# Patient Record
Sex: Male | Born: 1945 | Race: White | Hispanic: No | Marital: Single | Smoking: Current every day smoker
Health system: Southern US, Community
[De-identification: ages and names within clinical notes are randomized; demographics above are authoritative.]

## PROBLEM LIST (undated history)

## (undated) DIAGNOSIS — K219 Gastro-esophageal reflux disease without esophagitis: Secondary | ICD-10-CM

## (undated) DIAGNOSIS — I358 Other nonrheumatic aortic valve disorders: Secondary | ICD-10-CM

## (undated) DIAGNOSIS — I509 Heart failure, unspecified: Secondary | ICD-10-CM

## (undated) DIAGNOSIS — E559 Vitamin D deficiency, unspecified: Secondary | ICD-10-CM

## (undated) DIAGNOSIS — F419 Anxiety disorder, unspecified: Secondary | ICD-10-CM

## (undated) DIAGNOSIS — I219 Acute myocardial infarction, unspecified: Secondary | ICD-10-CM

## (undated) DIAGNOSIS — I639 Cerebral infarction, unspecified: Secondary | ICD-10-CM

## (undated) DIAGNOSIS — G20A1 Parkinson's disease without dyskinesia, without mention of fluctuations: Secondary | ICD-10-CM

## (undated) DIAGNOSIS — M5030 Other cervical disc degeneration, unspecified cervical region: Secondary | ICD-10-CM

## (undated) DIAGNOSIS — E785 Hyperlipidemia, unspecified: Secondary | ICD-10-CM

## (undated) DIAGNOSIS — R251 Tremor, unspecified: Secondary | ICD-10-CM

## (undated) DIAGNOSIS — M419 Scoliosis, unspecified: Secondary | ICD-10-CM

## (undated) DIAGNOSIS — T7840XA Allergy, unspecified, initial encounter: Secondary | ICD-10-CM

## (undated) DIAGNOSIS — N401 Enlarged prostate with lower urinary tract symptoms: Secondary | ICD-10-CM

## (undated) DIAGNOSIS — Z8673 Personal history of transient ischemic attack (TIA), and cerebral infarction without residual deficits: Secondary | ICD-10-CM

## (undated) DIAGNOSIS — I10 Essential (primary) hypertension: Secondary | ICD-10-CM

## (undated) DIAGNOSIS — J309 Allergic rhinitis, unspecified: Secondary | ICD-10-CM

## (undated) DIAGNOSIS — N400 Enlarged prostate without lower urinary tract symptoms: Secondary | ICD-10-CM

## (undated) DIAGNOSIS — G20 Parkinson's disease: Secondary | ICD-10-CM

## (undated) DIAGNOSIS — K623 Rectal prolapse: Secondary | ICD-10-CM

## (undated) DIAGNOSIS — H269 Unspecified cataract: Secondary | ICD-10-CM

## (undated) DIAGNOSIS — M1990 Unspecified osteoarthritis, unspecified site: Secondary | ICD-10-CM

## (undated) DIAGNOSIS — K642 Third degree hemorrhoids: Secondary | ICD-10-CM

## (undated) DIAGNOSIS — Z974 Presence of external hearing-aid: Secondary | ICD-10-CM

## (undated) DIAGNOSIS — Q2112 Patent foramen ovale: Secondary | ICD-10-CM

## (undated) DIAGNOSIS — R972 Elevated prostate specific antigen [PSA]: Secondary | ICD-10-CM

## (undated) DIAGNOSIS — F32A Depression, unspecified: Secondary | ICD-10-CM

## (undated) DIAGNOSIS — G5620 Lesion of ulnar nerve, unspecified upper limb: Secondary | ICD-10-CM

## (undated) DIAGNOSIS — I77810 Thoracic aortic ectasia: Secondary | ICD-10-CM

## (undated) HISTORY — DX: Hyperlipidemia, unspecified: E78.5

## (undated) HISTORY — DX: Essential (primary) hypertension: I10

## (undated) HISTORY — DX: Depression, unspecified: F32.A

## (undated) HISTORY — DX: Other nonrheumatic aortic valve disorders: I35.8

## (undated) HISTORY — DX: Cerebral infarction, unspecified: I63.9

## (undated) HISTORY — DX: Thoracic aortic ectasia: I77.810

## (undated) HISTORY — PX: CARDIAC VALVE REPLACEMENT: SHX585

## (undated) HISTORY — DX: Anxiety disorder, unspecified: F41.9

## (undated) HISTORY — DX: Personal history of transient ischemic attack (TIA), and cerebral infarction without residual deficits: Z86.73

## (undated) HISTORY — DX: Allergy, unspecified, initial encounter: T78.40XA

## (undated) HISTORY — DX: Gastro-esophageal reflux disease without esophagitis: K21.9

## (undated) HISTORY — DX: Third degree hemorrhoids: K64.2

## (undated) HISTORY — DX: Unspecified cataract: H26.9

## (undated) HISTORY — DX: Scoliosis, unspecified: M41.9

## (undated) HISTORY — DX: Allergic rhinitis, unspecified: J30.9

## (undated) HISTORY — DX: Patent foramen ovale: Q21.12

## (undated) HISTORY — PX: EYE SURGERY: SHX253

## (undated) HISTORY — DX: Tremor, unspecified: R25.1

## (undated) HISTORY — DX: Rectal prolapse: K62.3

## (undated) HISTORY — PX: FRACTURE SURGERY: SHX138

## (undated) HISTORY — DX: Vitamin D deficiency, unspecified: E55.9

## (undated) HISTORY — DX: Lesion of ulnar nerve, unspecified upper limb: G56.20

## (undated) HISTORY — DX: Parkinson's disease: G20

## (undated) HISTORY — PX: ABDOMINAL HYSTERECTOMY: SHX81

## (undated) HISTORY — DX: Acute myocardial infarction, unspecified: I21.9

## (undated) HISTORY — DX: Other cervical disc degeneration, unspecified cervical region: M50.30

## (undated) HISTORY — DX: Unspecified osteoarthritis, unspecified site: M19.90

## (undated) HISTORY — DX: Parkinson's disease without dyskinesia, without mention of fluctuations: G20.A1

---

## 2004-09-02 HISTORY — PX: NASAL SINUS SURGERY: SHX719

## 2005-09-02 HISTORY — PX: COLONOSCOPY: SHX174

## 2010-07-10 ENCOUNTER — Encounter (INDEPENDENT_AMBULATORY_CARE_PROVIDER_SITE_OTHER): Payer: Self-pay | Admitting: *Deleted

## 2010-07-17 ENCOUNTER — Encounter: Payer: Self-pay | Admitting: Gastroenterology

## 2010-09-05 ENCOUNTER — Encounter (INDEPENDENT_AMBULATORY_CARE_PROVIDER_SITE_OTHER): Payer: Self-pay | Admitting: *Deleted

## 2010-09-13 ENCOUNTER — Ambulatory Visit: Admit: 2010-09-13 | Payer: Self-pay | Attending: Gastroenterology | Admitting: Gastroenterology

## 2010-10-02 NOTE — Letter (Signed)
Summary: New Patient letter  [facility]  [address] [place], [place] [address]   Phone: [phone]  Fax: [phone]       07/10/2010 MRN: [id number]  Travis Reese [address] [place], [place]  [address]  Dear Mr. Travis Reese,  Welcome to the Gastroenterology Division at [facility].    You are scheduled to see Dr.  Christella Hartigan on 08-21-10 at 3:30p.m. on the 3rd floor at [facility], [address]  We ask that you try to arrive at our office 15 minutes prior to your appointment time to allow for check-in.  We would like you to complete the enclosed self-administered evaluation form prior to your visit and bring it with you on the day of your appointment.  We will review it with you.  Also, please bring a complete list of all your medications or, if you prefer, bring the medication bottles and we will list them.  Please bring your insurance card so that we may make a copy of it.  If your insurance requires a referral to see a specialist, please bring your referral form from your primary care physician.  Co-payments are due at the time of your visit and may be paid by cash, check or credit card.     Your office visit will consist of a consult with your physician (includes a physical exam), any laboratory testing he/she may order, scheduling of any necessary diagnostic testing (e.g. x-ray, ultrasound, CT-scan), and scheduling of a procedure (e.g. Endoscopy, Colonoscopy) if required.  Please allow enough time on your schedule to allow for any/all of these possibilities.    If you cannot keep your appointment, please call [phone] to cancel or reschedule prior to your appointment date.  This allows [place] the opportunity to schedule an appointment for another patient in need of care.  If you do not cancel or reschedule by 5 p.m. the business day prior to your appointment date, you will be charged a $50.00 late cancellation/no-show fee.    Thank you for choosing [facility] for your medical needs.  We appreciate the opportunity to care for you.  Please visit [place] at our website  to learn more about our practice.                     Sincerely,                                                             The Gastroenterology Division

## 2010-10-02 NOTE — Letter (Signed)
Summary: New Patient letter  [facility]  [address] [place], [place] [address]   Phone: [phone]  Fax: [phone]       07/17/2010 MRN: [id number]  Travis Reese [address] [place], [place]  [address]  Dear Mr. SITZER,  Welcome to the Gastroenterology Division at [facility]    You are scheduled to see Dr.  Christella Hartigan on 09-05-2010  at 11am on the 3rd floor at [facility], [address].  We ask that you try to arrive at our office 15 minutes prior to your appointment time to allow for check-in.  We would like you to complete the enclosed self-administered evaluation form prior to your visit and bring it with you on the day of your appointment.  We will review it with you.  Also, please bring a complete list of all your medications or, if you prefer, bring the medication bottles and we will list them.  Please bring your insurance card so that we may make a copy of it.  If your insurance requires a referral to see a specialist, please bring your referral form from your primary care physician.  Co-payments are due at the time of your visit and may be paid by cash, check or credit card.  If no insurance, $184 would be required at check in.   Your office visit will consist of a consult with your physician (includes a physical exam), any laboratory testing he/she may order, scheduling of any necessary diagnostic testing (e.g. x-ray, ultrasound, CT-scan), and scheduling of a procedure (e.g. Endoscopy, Colonoscopy) if required.  Please allow enough time on your schedule to allow for any/all of these possibilities.    If you cannot keep your appointment, please call [phone] to cancel or reschedule prior to your appointment date.  This allows [place] the opportunity to schedule an appointment for another patient in need of care.  If you do not cancel or reschedule by 5 p.m. the business day prior to your appointment date, you will be charged a $50.00 late  cancellation/no-show fee.    Thank you for choosing [facility] for your medical needs.  We appreciate the opportunity to care for you.  Please visit [place] at our website  to learn more about our practice.                     Sincerely,                                                             The Gastroenterology Division

## 2010-10-04 NOTE — Letter (Signed)
Summary: New Patient letter  [facility]  [address] [place], [place] [address]   Phone: [phone]  Fax: [phone]       09/05/2010 MRN: [id number]  Travis Reese [address] [place], [place]  [address]  Dear Travis Reese,  Welcome to the Gastroenterology Division at [facility].    You are scheduled to see Dr.  Juanda Chance on 10-31-10 at 9:15a.m. on the 3rd floor at [facility], [address].  We ask that you try to arrive at our office 15 minutes prior to your appointment time to allow for check-in.  We would like you to complete the enclosed self-administered evaluation form prior to your visit and bring it with you on the day of your appointment.  We will review it with you.  Also, please bring a complete list of all your medications or, if you prefer, bring the medication bottles and we will list them.  Please bring your insurance card so that we may make a copy of it.  If your insurance requires a referral to see a specialist, please bring your referral form from your primary care physician.  Co-payments are due at the time of your visit and may be paid by cash, check or credit card.     Your office visit will consist of a consult with your physician (includes a physical exam), any laboratory testing he/she may order, scheduling of any necessary diagnostic testing (e.g. x-ray, ultrasound, CT-scan), and scheduling of a procedure (e.g. Endoscopy, Colonoscopy) if required.  Please allow enough time on your schedule to allow for any/all of these possibilities.    If you cannot keep your appointment, please call [phone] to cancel or reschedule prior to your appointment date.  This allows [place] the opportunity to schedule an appointment for another patient in need of care.  If you do not cancel or reschedule by 5 p.m. the business day prior to your appointment date, you will be charged a $50.00 late cancellation/no-show fee.    Thank you for choosing [facility] for your medical needs.  We appreciate the opportunity to care for you.  Please visit [place] at our website  to learn more about our practice.                     Sincerely,                                                             The Gastroenterology Division

## 2010-10-31 ENCOUNTER — Ambulatory Visit: Payer: Self-pay | Admitting: Internal Medicine

## 2015-02-01 ENCOUNTER — Ambulatory Visit (INDEPENDENT_AMBULATORY_CARE_PROVIDER_SITE_OTHER): Payer: Self-pay

## 2015-02-01 ENCOUNTER — Ambulatory Visit (INDEPENDENT_AMBULATORY_CARE_PROVIDER_SITE_OTHER): Payer: Self-pay | Admitting: Internal Medicine

## 2015-02-01 VITALS — BP 114/80 | HR 78 | Temp 98.6°F | Resp 16 | Ht 72.0 in | Wt 231.8 lb

## 2015-02-01 DIAGNOSIS — J9801 Acute bronchospasm: Secondary | ICD-10-CM

## 2015-02-01 DIAGNOSIS — R071 Chest pain on breathing: Secondary | ICD-10-CM

## 2015-02-01 DIAGNOSIS — R059 Cough, unspecified: Secondary | ICD-10-CM

## 2015-02-01 DIAGNOSIS — J324 Chronic pansinusitis: Secondary | ICD-10-CM

## 2015-02-01 DIAGNOSIS — R05 Cough: Secondary | ICD-10-CM

## 2015-02-01 DIAGNOSIS — J189 Pneumonia, unspecified organism: Secondary | ICD-10-CM

## 2015-02-01 LAB — POCT CBC
Granulocyte percent: 78 %G (ref 37–80)
HCT, POC: 40.1 % — AB (ref 43.5–53.7)
Hemoglobin: 13.6 g/dL — AB (ref 14.1–18.1)
Lymph, poc: 2.5 (ref 0.6–3.4)
MCH, POC: 33.5 pg — AB (ref 27–31.2)
MCHC: 33.9 g/dL (ref 31.8–35.4)
MCV: 98.8 fL — AB (ref 80–97)
MID (cbc): 1.1 — AB (ref 0–0.9)
MPV: 6.3 fL (ref 0–99.8)
POC Granulocyte: 12.8 — AB (ref 2–6.9)
POC LYMPH PERCENT: 15.1 %L (ref 10–50)
POC MID %: 6.9 %M (ref 0–12)
Platelet Count, POC: 278 10*3/uL (ref 142–424)
RBC: 4.06 M/uL — AB (ref 4.69–6.13)
RDW, POC: 12.4 %
WBC: 16.4 10*3/uL — AB (ref 4.6–10.2)

## 2015-02-01 MED ORDER — AZITHROMYCIN 500 MG PO TABS: 500.0000 mg | ORAL_TABLET | Freq: Every day | ORAL | Status: DC

## 2015-02-01 MED ORDER — CEFTRIAXONE SODIUM 1 G IJ SOLR
1.0000 g | Freq: Once | INTRAMUSCULAR | Status: AC
  Administered 2015-02-01: 1 g via INTRAMUSCULAR

## 2015-02-01 MED ORDER — HYDROCODONE-ACETAMINOPHEN 7.5-325 MG/15ML PO SOLN: 10.0000 mL | Freq: Four times a day (QID) | ORAL | Status: DC | PRN

## 2015-02-01 MED ORDER — ALBUTEROL SULFATE HFA 108 (90 BASE) MCG/ACT IN AERS: 2.0000 | INHALATION_SPRAY | Freq: Four times a day (QID) | RESPIRATORY_TRACT | Status: DC | PRN

## 2015-02-01 NOTE — Progress Notes (Signed)
   Subjective:    Patient ID: Travis Reese, male    DOB: Jun 03, 1946, 69 y.o.   MRN: [id number]  HPI I, Jule Ser R.T. (R), am scribing for Dr. Lou Miner M.D.   Travis Reese comes in today with sore throat, cough, congestion with yellow sputum x 8 days. He has chest tightness with wheezing. He also states he has had fever spiking 103 degrees last night with nausea. He denies diarrhea and constipation. He has no Hx of asthma or Diabetes. He has recently been incarcerated with no current PCP. He has been tested for TB annually. He is also requesting a refill of his Lisinopril HCTZ. He has a Hx of HTN. He states he regularly checks his BP which is 120-130/ 80-85. He states he takes his Lisinopril daily with no issues. Due to insurance reasons does not want rfs of BP meds. He has yellow green nasal and chest discharge. No dizzy, syncope  Review of Systems     Objective:   Physical Exam  Constitutional: He is oriented to person, place, and time. He appears well-developed and well-nourished. No distress.  HENT:  Head: Normocephalic.  Right Ear: External ear normal.  Nose: Mucosal edema and rhinorrhea present. Epistaxis is observed. Right sinus exhibits maxillary sinus tenderness and frontal sinus tenderness. Left sinus exhibits maxillary sinus tenderness and frontal sinus tenderness.  Mouth/Throat: Oropharynx is clear and moist.  Eyes: Conjunctivae and EOM are normal. Pupils are equal, round, and reactive to light.  Neck: Normal range of motion. Neck supple.  Cardiovascular: Normal rate, regular rhythm and normal heart sounds.   Pulmonary/Chest: No tachypnea. No respiratory distress. He has decreased breath sounds. He has wheezes. He has rhonchi. He has rales.    Worse on right  Lymphadenopathy:    He has cervical adenopathy.  Neurological: He is alert and oriented to person, place, and time. He exhibits normal muscle tone. Coordination normal.  Skin: He is not diaphoretic.    Psychiatric: He has a normal mood and affect. His behavior is normal. Thought content normal.    [facility] reading (PRIMARY) by  Dr.Guest. Chronic changes, increased markings right greater than left Results for orders placed or performed in visit on 02/01/15  POCT CBC  Result Value Ref Range   WBC 16.4 (A) 4.6 - 10.2 K/uL   Lymph, poc 2.5 0.6 - 3.4   POC LYMPH PERCENT 15.1 10 - 50 %L   MID (cbc) 1.1 (A) 0 - 0.9   POC MID % 6.9 0 - 12 %M   POC Granulocyte 12.8 (A) 2 - 6.9   Granulocyte percent 78.0 37 - 80 %G   RBC 4.06 (A) 4.69 - 6.13 M/uL   Hemoglobin 13.6 (A) 14.1 - 18.1 g/dL   HCT, POC 40.1 (A) 43.5 - 53.7 %   MCV 98.8 (A) 80 - 97 fL   MCH, POC 33.5 (A) 27 - 31.2 pg   MCHC 33.9 31.8 - 35.4 g/dL   RDW, POC 12.4 %   Platelet Count, POC 278 142 - 424 K/uL   MPV 6.3 0 - 99.8 fL          Assessment & Plan:  Sinusitis/Bronchospasm/Pneumonia Rocephin 1g/Zithroromax 500mg /albuterol

## 2015-02-01 NOTE — Patient Instructions (Addendum)
Sinusitis Sinusitis is redness, soreness, and inflammation of the paranasal sinuses. Paranasal sinuses are air pockets within the bones of your face (beneath the eyes, the middle of the forehead, or above the eyes). In healthy paranasal sinuses, mucus is able to drain out, and air is able to circulate through them by way of your nose. However, when your paranasal sinuses are inflamed, mucus and air can become trapped. This can allow bacteria and other germs to grow and cause infection. Sinusitis can develop quickly and last only a short time (acute) or continue over a long period (chronic). Sinusitis that lasts for more than 12 weeks is considered chronic.  CAUSES  Causes of sinusitis include:  Allergies.  Structural abnormalities, such as displacement of the cartilage that separates your nostrils (deviated septum), which can decrease the air flow through your nose and sinuses and affect sinus drainage.  Functional abnormalities, such as when the small hairs (cilia) that line your sinuses and help remove mucus do not work properly or are not present. SIGNS AND SYMPTOMS  Symptoms of acute and chronic sinusitis are the same. The primary symptoms are pain and pressure around the affected sinuses. Other symptoms include:  Upper toothache.  Earache.  Headache.  Bad breath.  Decreased sense of smell and taste.  A cough, which worsens when you are lying flat.  Fatigue.  Fever.  Thick drainage from your nose, which often is green and may contain pus (purulent).  Swelling and warmth over the affected sinuses. DIAGNOSIS  Your health care provider will perform a physical exam. During the exam, your health care provider may:  Look in your nose for signs of abnormal growths in your nostrils (nasal polyps).  Tap over the affected sinus to check for signs of infection.  View the inside of your sinuses (endoscopy) using an imaging device that has a light attached (endoscope). If your health  care provider suspects that you have chronic sinusitis, one or more of the following tests may be recommended:  Allergy tests.  Nasal culture. A sample of mucus is taken from your nose, sent to a lab, and screened for bacteria.  Nasal cytology. A sample of mucus is taken from your nose and examined by your health care provider to determine if your sinusitis is related to an allergy. TREATMENT  Most cases of acute sinusitis are related to a viral infection and will resolve on their own within 10 days. Sometimes medicines are prescribed to help relieve symptoms (pain medicine, decongestants, nasal steroid sprays, or saline sprays).  However, for sinusitis related to a bacterial infection, your health care provider will prescribe antibiotic medicines. These are medicines that will help kill the bacteria causing the infection.  Rarely, sinusitis is caused by a fungal infection. In theses cases, your health care provider will prescribe antifungal medicine. For some cases of chronic sinusitis, surgery is needed. Generally, these are cases in which sinusitis recurs more than 3 times per year, despite other treatments. HOME CARE INSTRUCTIONS   Drink plenty of water. Water helps thin the mucus so your sinuses can drain more easily.  Use a humidifier.  Inhale steam 3 to 4 times a day (for example, sit in the bathroom with the shower running).  Apply a warm, moist washcloth to your face 3 to 4 times a day, or as directed by your health care provider.  Use saline nasal sprays to help moisten and clean your sinuses.  Take medicines only as directed by your health care provider.    If you were prescribed either an antibiotic or antifungal medicine, finish it all even if you start to feel better. SEEK IMMEDIATE MEDICAL CARE IF:  You have increasing pain or severe headaches.  You have nausea, vomiting, or drowsiness.  You have swelling around your face.  You have vision problems.  You have a stiff  neck.  You have difficulty breathing. MAKE SURE YOU:   Understand these instructions.  Will watch your condition.  Will get help right away if you are not doing well or get worse. Document Released: 08/19/2005 Document Revised: 01/03/2014 Document Reviewed: 09/03/2011 [facility] Patient Information 2015 [place], [place]. This information is not intended to replace advice given to you by your health care provider. Make sure you discuss any questions you have with your health care provider. Bronchospasm A bronchospasm is a spasm or tightening of the airways going into the lungs. During a bronchospasm breathing becomes more difficult because the airways get smaller. When this happens there can be coughing, a whistling sound when breathing (wheezing), and difficulty breathing. Bronchospasm is often associated with asthma, but not all patients who experience a bronchospasm have asthma. CAUSES  A bronchospasm is caused by inflammation or irritation of the airways. The inflammation or irritation may be triggered by:   Allergies (such as to animals, pollen, food, or mold). Allergens that cause bronchospasm may cause wheezing immediately after exposure or many hours later.   Infection. Viral infections are believed to be the most common cause of bronchospasm.   Exercise.   Irritants (such as pollution, cigarette smoke, strong odors, aerosol sprays, and paint fumes).   Weather changes. Winds increase molds and pollens in the air. Rain refreshes the air by washing irritants out. Cold air may cause inflammation.   Stress and emotional upset.  SIGNS AND SYMPTOMS   Wheezing.   Excessive nighttime coughing.   Frequent or severe coughing with a simple cold.   Chest tightness.   Shortness of breath.  DIAGNOSIS  Bronchospasm is usually diagnosed through a history and physical exam. Tests, such as chest X-rays, are sometimes done to look for other conditions. TREATMENT   Inhaled  medicines can be given to open up your airways and help you breathe. The medicines can be given using either an inhaler or a nebulizer machine.  Corticosteroid medicines may be given for severe bronchospasm, usually when it is associated with asthma. HOME CARE INSTRUCTIONS   Always have a plan prepared for seeking medical care. Know when to call your health care provider and local emergency services (911 in the [place] Know where you can access local emergency care.  Only take medicines as directed by your health care provider.  If you were prescribed an inhaler or nebulizer machine, ask your health care provider to explain how to use it correctly. Always use a spacer with your inhaler if you were given one.  It is necessary to remain calm during an attack. Try to relax and breathe more slowly.  Control your home environment in the following ways:   Change your heating and air conditioning filter at least once a month.   Limit your use of fireplaces and wood stoves.  Do not smoke and do not allow smoking in your home.   Avoid exposure to perfumes and fragrances.   Get rid of pests (such as roaches and mice) and their droppings.   Throw away plants if you see mold on them.   Keep your house clean and dust free.   Replace carpet  with wood, tile, or vinyl flooring. Carpet can trap dander and dust.   Use allergy-proof pillows, mattress covers, and box spring covers.   Wash bed sheets and blankets every week in hot water and dry them in a dryer.   Use blankets that are made of polyester or cotton.   Wash hands frequently. SEEK MEDICAL CARE IF:   You have muscle aches.   You have chest pain.   The sputum changes from clear or white to yellow, green, gray, or bloody.   The sputum you cough up gets thicker.   There are problems that may be related to the medicine you are given, such as a rash, itching, swelling, or trouble breathing.  SEEK IMMEDIATE MEDICAL  CARE IF:   You have worsening wheezing and coughing even after taking your prescribed medicines.   You have increased difficulty breathing.   You develop severe chest pain. MAKE SURE YOU:   Understand these instructions.  Will watch your condition.  Will get help right away if you are not doing well or get worse. Document Released: 08/22/2003 Document Revised: 08/24/2013 Document Reviewed: 02/08/2013 [facility] Patient Information 2015 [place], [place]. This information is not intended to replace advice given to you by your health care provider. Make sure you discuss any questions you have with your health care provider. Pneumonia Pneumonia is an infection of the lungs.  CAUSES Pneumonia may be caused by bacteria or a virus. Usually, these infections are caused by breathing infectious particles into the lungs (respiratory tract). SIGNS AND SYMPTOMS   Cough.  Fever.  Chest pain.  Increased rate of breathing.  Wheezing.  Mucus production. DIAGNOSIS  If you have the common symptoms of pneumonia, your health care provider will typically confirm the diagnosis with a chest X-ray. The X-ray will show an abnormality in the lung (pulmonary infiltrate) if you have pneumonia. Other tests of your blood, urine, or sputum may be done to find the specific cause of your pneumonia. Your health care provider may also do tests (blood gases or pulse oximetry) to see how well your lungs are working. TREATMENT  Some forms of pneumonia may be spread to other people when you cough or sneeze. You may be asked to wear a mask before and during your exam. Pneumonia that is caused by bacteria is treated with antibiotic medicine. Pneumonia that is caused by the influenza virus may be treated with an antiviral medicine. Most other viral infections must run their course. These infections will not respond to antibiotics.  HOME CARE INSTRUCTIONS   Cough suppressants may be used if you are losing too much rest.  However, coughing protects you by clearing your lungs. You should avoid using cough suppressants if you can.  Your health care provider may have prescribed medicine if he or she thinks your pneumonia is caused by bacteria or influenza. Finish your medicine even if you start to feel better.  Your health care provider may also prescribe an expectorant. This loosens the mucus to be coughed up.  Take medicines only as directed by your health care provider.  Do not smoke. Smoking is a common cause of bronchitis and can contribute to pneumonia. If you are a smoker and continue to smoke, your cough may last several weeks after your pneumonia has cleared.  A cold steam vaporizer or humidifier in your room or home may help loosen mucus.  Coughing is often worse at night. Sleeping in a semi-upright position in a recliner or using a couple pillows  under your head will help with this.  Get rest as you feel it is needed. Your body will usually let you know when you need to rest. PREVENTION A pneumococcal shot (vaccine) is available to prevent a common bacterial cause of pneumonia. This is usually suggested for:  People over 26 years old.  Patients on chemotherapy.  People with chronic lung problems, such as bronchitis or emphysema.  People with immune system problems. If you are over 65 or have a high risk condition, you may receive the pneumococcal vaccine if you have not received it before. In some countries, a routine influenza vaccine is also recommended. This vaccine can help prevent some cases of pneumonia.You may be offered the influenza vaccine as part of your care. If you smoke, it is time to quit. You may receive instructions on how to stop smoking. Your health care provider can provide medicines and counseling to help you quit. SEEK MEDICAL CARE IF: You have a fever. SEEK IMMEDIATE MEDICAL CARE IF:   Your illness becomes worse. This is especially true if you are elderly or weakened from  any other disease.  You cannot control your cough with suppressants and are losing sleep.  You begin coughing up blood.  You develop pain which is getting worse or is uncontrolled with medicines.  Any of the symptoms which initially brought you in for treatment are getting worse rather than better.  You develop shortness of breath or chest pain. MAKE SURE YOU:   Understand these instructions.  Will watch your condition.  Will get help right away if you are not doing well or get worse. Document Released: 08/19/2005 Document Revised: 01/03/2014 Document Reviewed: 11/08/2010 [facility] Patient Information 2015 [place], [place]. This information is not intended to replace advice given to you by your health care provider. Make sure you discuss any questions you have with your health care provider.

## 2015-04-19 ENCOUNTER — Ambulatory Visit (INDEPENDENT_AMBULATORY_CARE_PROVIDER_SITE_OTHER): Payer: Medicare Other | Admitting: Internal Medicine

## 2015-04-19 ENCOUNTER — Encounter: Payer: Self-pay | Admitting: Internal Medicine

## 2015-04-19 VITALS — BP 120/80 | HR 82 | Ht 72.0 in | Wt 233.0 lb

## 2015-04-19 DIAGNOSIS — K648 Other hemorrhoids: Secondary | ICD-10-CM

## 2015-04-19 DIAGNOSIS — K642 Third degree hemorrhoids: Secondary | ICD-10-CM

## 2015-04-19 DIAGNOSIS — Z1211 Encounter for screening for malignant neoplasm of colon: Secondary | ICD-10-CM | POA: Diagnosis not present

## 2015-04-19 HISTORY — DX: Third degree hemorrhoids: K64.2

## 2015-04-19 NOTE — Progress Notes (Signed)
Subjective:    Patient ID: Travis Reese, male    DOB: 06-05-1946, 69 y.o.   MRN: [id number] Cc: difficulty cleansing after defecation and fecal soiling HPI 69 yo wm with a long hx of having to wipe multiple x after defecation and digitally emptying rectum. Some minor rectal bleeding. Bright red spotting or on toilet paper. Fiber supplementation has not helped. Had a colonoscopy approx 10 yrs ago in [place] - negative for polyps. 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  Appropriate urge and response to that. He is not aware of a hemorrhoid dx but does have a sense of something bulging when he defecates and afterward.  No Known Allergies Outpatient Prescriptions Prior to Visit  Medication Sig Dispense Refill  . albuterol (PROVENTIL HFA;VENTOLIN HFA) 108 (90 BASE) MCG/ACT inhaler Inhale 2 puffs into the lungs every 6 (six) hours as needed for wheezing or shortness of breath. 1 Inhaler 0  . aspirin 81 MG chewable tablet Chew by mouth daily.    . ClonazePAM (KLONOPIN PO) Take by mouth.    Marland Kitchen lisinopril-hydrochlorothiazide (PRINZIDE,ZESTORETIC) 20-25 MG per tablet Take 1 tablet by mouth daily.    . Psyllium (REGULOID PO) Take by mouth.    Marland Kitchen azithromycin (ZITHROMAX) 500 MG tablet Take 1 tablet (500 mg total) by mouth daily. 5 tablet 0  . HYDROcodone-acetaminophen (HYCET) 7.5-325 mg/15 ml solution Take 10 mLs by mouth every 6 (six) hours as needed. 120 mL 0  . tamsulosin (FLOMAX) 0.4 MG CAPS capsule Take 0.4 mg by mouth.    . terazosin (HYTRIN) 2 MG capsule Take 2 mg by mouth at bedtime.     No facility-administered medications prior to visit.   Past Medical History  Diagnosis Date  . Benign prostatic hyperplasia with elevated prostate specific antigen (PSA)   . Anxiety   . Arthritis   . Pneumonia   . Hypertension   . Prolapsed internal hemorrhoids, grade 3 04/19/2015   Past Surgical  History  Procedure Laterality Date  . Sinus surgery with instatrak    . Wisdom tooth extraction    . Colonoscopy     Social History   Social History  . Marital Status: Unknown    Spouse Name: N/A  . Number of Children: 2  . Years of Education: N/A   Occupational History  . volunteer work    Social History Main Topics  . Smoking status: Current Every Day Smoker -- 0.50 packs/day    Types: Cigarettes  . Smokeless tobacco: None     Comment: formerly quit until 5/16 started again  . Alcohol Use: 0.0 oz/week    0 Standard drinks or equivalent per week     Comment: occasionally  . Drug Use: No  . Sexual Activity: Not Asked   Other Topics Concern  . None   Social History Narrative   Divorced lives with father after incarceration x 5 yrs   1 son, 1 daughter   Volunteer work   3 caffeine/day   04/19/2015   Family History  Problem Relation Age of Onset  . Hypertension Sister   . Colon polyps Father   . Breast cancer Sister   . Prostate cancer Father   . Prostate cancer Paternal Uncle      Review of Systems + back pain, joint pains, decreased hearing, pedal edema, anxiety all other ROS negative    Objective:  Physical Exam @BP  120/80 mmHg  Pulse 82  Ht 6' (1.829 m)  Wt 233 lb (105.688 kg)  BMI 31.59 kg/m2@  General:  Well-developed, well-nourished and in no acute distress Eyes:  anicteric. Lungs: Clear to auscultation bilaterally. Heart:  S1S2, no rubs, murmurs, gallops. Abdomen:  soft, non-tender, no hepatosplenomegaly, hernia, or mass and BS+.  Skin   no rash. Neuro:  A&O x 3.  Psych:  appropriate mood and  Affect.   Rectal:  Patti [place], [place] present.  Anoderm inspection revealed anal tags all positions and Gr 3 RA internal hemorrhoid Anal wink was absent Digital exam revealed slightly reduced resting tone and normal voluntary squeeze. No mass or rectocele present. Simulated defecation with valsalva revealed appropriate abdominal contraction and  descent.     Anoscopy was performed with the patient in the left lateral decubitus position while a chaperone was present and revealed Gr 3 RA, Gr 2-3 RP and Gr 2 LL internal hemorrhoids  PROCEDURE NOTE: The patient presents with symptomatic grade 3 hemorrhoids, requesting rubber band ligation of his/her hemorrhoidal disease.  All risks, benefits and alternative forms of therapy were described and informed consent was obtained.    The decision was made to band the RA internal hemorrhoid, and the [place] was used to perform band ligation without complication.  Digital anorectal examination was then performed to assure proper positioning of the band, and to adjust the banded tissue as required.  The patient was discharged home without pain or other issues.  Dietary and behavioral recommendations were given and along with follow-up instructions.     The following adjunctive treatments were recommended:  Benefiber 3 tbsp daily +/- MiraLax  The patient will return in 2 weeks for  follow-up and possible additional banding as required. No complications were encountered and the patient tolerated the procedure well.       Assessment & Plan:  Prolapsed internal hemorrhoids, grade 3 RA banded - RTC 2 weeks  Needs repeat screening colonoscopy also anticipate after hemorrhoidsTx  I appreciate the opportunity to care for this patient. Bobbye Charleston, MD

## 2015-04-19 NOTE — Patient Instructions (Signed)
HEMORRHOID BANDING PROCEDURE    FOLLOW-UP CARE   1. The procedure you have had should have been relatively painless since the banding of the area involved does not have nerve endings and there is no pain sensation.  The rubber band cuts off the blood supply to the hemorrhoid and the band may fall off as soon as 48 hours after the banding (the band may occasionally be seen in the toilet bowl following a bowel movement). You may notice a temporary feeling of fullness in the rectum which should respond adequately to plain Tylenol or Motrin.  2. Following the banding, avoid strenuous exercise that evening and resume full activity the next day.  A sitz bath (soaking in a warm tub) or bidet is soothing, and can be useful for cleansing the area after bowel movements.     3. To avoid constipation, take two tablespoons of natural wheat bran, natural oat bran, flax, Benefiber or any over the counter fiber supplement and increase your water intake to 7-8 glasses daily.    4. Unless you have been prescribed anorectal medication, do not put anything inside your rectum for two weeks: No suppositories, enemas, fingers, etc.  5. Occasionally, you may have more bleeding than usual after the banding procedure.  This is often from the untreated hemorrhoids rather than the treated one.  Don't be concerned if there is a tablespoon or so of blood.  If there is more blood than this, lie flat with your bottom higher than your head and apply an ice pack to the area. If the bleeding does not stop within a half an hour or if you feel faint, call our office at [phone] or go to the emergency room.  6. Problems are not common; however, if there is a substantial amount of bleeding, severe pain, chills, fever or difficulty passing urine (very rare) or other problems, you should call us at [phone] or report to the nearest emergency room.  7. Do not stay seated continuously for more than 2-3 hours for a day or two  after the procedure.  Tighten your buttock muscles 10-15 times every two hours and take 10-15 deep breaths every 1-2 hours.  Do not spend more than a few minutes on the toilet if you cannot empty your bowel; instead re-visit the toilet at a later time.    Use 3 tablespoons of Benefiber daily, handout provided.  You may use Miralax as needed for your hard stools.  We will see you next on 05/05/15 for additional banding.   I appreciate the opportunity to care for you.  Silvano Rusk, MD, [facility]

## 2015-04-20 ENCOUNTER — Encounter: Payer: Self-pay | Admitting: Internal Medicine

## 2015-04-20 NOTE — Assessment & Plan Note (Signed)
RA banded - RTC 2 weeks

## 2015-05-05 ENCOUNTER — Encounter: Payer: Self-pay | Admitting: Internal Medicine

## 2015-05-05 ENCOUNTER — Ambulatory Visit (INDEPENDENT_AMBULATORY_CARE_PROVIDER_SITE_OTHER): Payer: Medicare Other | Admitting: Internal Medicine

## 2015-05-05 VITALS — BP 126/78 | HR 84 | Ht 72.0 in | Wt 231.2 lb

## 2015-05-05 DIAGNOSIS — K642 Third degree hemorrhoids: Secondary | ICD-10-CM

## 2015-05-05 DIAGNOSIS — K648 Other hemorrhoids: Secondary | ICD-10-CM | POA: Diagnosis not present

## 2015-05-05 NOTE — Progress Notes (Signed)
Patient ID: Travis Reese, male   DOB: 14-Oct-1945, 69 y.o.   MRN: [id number]       A she returns for follow-up banding. On August 17 the right anterior internal hemorrhoid grade 3 was banded and he notes significant improvement.  PROCEDURE NOTE: The patient presents with symptomatic grade 2 and 3  hemorrhoids, requesting rubber band ligation of his/her hemorrhoidal disease.  All risks, benefits and alternative forms of therapy were described and informed consent was obtained.   The right anterior hemorrhoid was still prolapsed but less than before. Double on inspection and palpable on rectal exam. He has anal tags as well. The decision was made to band the RP and LL internal hemorrhoids, and the [place] was used to perform band ligation without complication.  Digital anorectal examination was then performed to assure proper positioning of the band, and to adjust the banded tissue as required.  The patient was discharged home without pain or other issues.  Dietary and behavioral recommendations were given and along with follow-up instructions.     The following adjunctive treatments were recommended:  Continue Benefiber  The patient will return in approximately 6 weeks for  follow-up and possible additional banding as required. I anticipate he will need repeat banding of the right anterior hemorrhoid. No complications were encountered and the patient tolerated the procedure well.   Prolapsed internal hemorrhoids, grade 3 RP and LL banded RTC 6 wks     I appreciate the opportunity to care for this patient. CC: Ronita Hipps, MD

## 2015-05-05 NOTE — Patient Instructions (Addendum)
You have been given a separate informational sheet regarding your tobacco use, the importance of quitting and local resources to help you quit.  HEMORRHOID BANDING PROCEDURE    FOLLOW-UP CARE   1. The procedure you have had should have been relatively painless since the banding of the area involved does not have nerve endings and there is no pain sensation.  The rubber band cuts off the blood supply to the hemorrhoid and the band may fall off as soon as 48 hours after the banding (the band may occasionally be seen in the toilet bowl following a bowel movement). You may notice a temporary feeling of fullness in the rectum which should respond adequately to plain Tylenol or Motrin.  2. Following the banding, avoid strenuous exercise that evening and resume full activity the next day.  A sitz bath (soaking in a warm tub) or bidet is soothing, and can be useful for cleansing the area after bowel movements.     3. To avoid constipation, take two tablespoons of natural wheat bran, natural oat bran, flax, Benefiber or any over the counter fiber supplement and increase your water intake to 7-8 glasses daily.    4. Unless you have been prescribed anorectal medication, do not put anything inside your rectum for two weeks: No suppositories, enemas, fingers, etc.  5. Occasionally, you may have more bleeding than usual after the banding procedure.  This is often from the untreated hemorrhoids rather than the treated one.  Don't be concerned if there is a tablespoon or so of blood.  If there is more blood than this, lie flat with your bottom higher than your head and apply an ice pack to the area. If the bleeding does not stop within a half an hour or if you feel faint, call our office at [phone] or go to the emergency room.  6. Problems are not common; however, if there is a substantial amount of bleeding, severe pain, chills, fever or difficulty passing urine (very rare) or other problems, you  should call us at [phone] or report to the nearest emergency room.  7. Do not stay seated continuously for more than 2-3 hours for a day or two after the procedure.  Tighten your buttock muscles 10-15 times every two hours and take 10-15 deep breaths every 1-2 hours.  Do not spend more than a few minutes on the toilet if you cannot empty your bowel; instead re-visit the toilet at a later time.  Stay on Benefiber.  Please follow up with Dr. Carlean Purl in six weeks on 06/12/15 at 3 pm. Please call if you need to reschedule.

## 2015-05-05 NOTE — Assessment & Plan Note (Signed)
RP and LL banded RTC 6 wks

## 2015-06-12 ENCOUNTER — Encounter: Payer: Self-pay | Admitting: Internal Medicine

## 2015-06-12 ENCOUNTER — Ambulatory Visit (INDEPENDENT_AMBULATORY_CARE_PROVIDER_SITE_OTHER): Payer: Medicare Other | Admitting: Internal Medicine

## 2015-06-12 VITALS — BP 120/80 | HR 20 | Ht 72.0 in | Wt 231.2 lb

## 2015-06-12 DIAGNOSIS — K642 Third degree hemorrhoids: Secondary | ICD-10-CM

## 2015-06-12 DIAGNOSIS — K648 Other hemorrhoids: Secondary | ICD-10-CM

## 2015-06-12 NOTE — Patient Instructions (Signed)
Per Dr. Carlean Purl please stop your abdominal crunches  You have been scheduled a banding follow up appointment with Dr. Carlean Purl on 08/11/15 at 11:30 am.     HEMORRHOID [place]   1. The procedure you have had should have been relatively painless since the banding of the area involved does not have nerve endings and there is no pain sensation.  The rubber band cuts off the blood supply to the hemorrhoid and the band may fall off as soon as 48 hours after the banding (the band may occasionally be seen in the toilet bowl following a bowel movement). You may notice a temporary feeling of fullness in the rectum which should respond adequately to plain Tylenol or Motrin.  2. Following the banding, avoid strenuous exercise that evening and resume full activity the next day.  A sitz bath (soaking in a warm tub) or bidet is soothing, and can be useful for cleansing the area after bowel movements.     3. To avoid constipation, take two tablespoons of natural wheat bran, natural oat bran, flax, Benefiber or any over the counter fiber supplement and increase your water intake to 7-8 glasses daily.    4. Unless you have been prescribed anorectal medication, do not put anything inside your rectum for two weeks: No suppositories, enemas, fingers, etc.  5. Occasionally, you may have more bleeding than usual after the banding procedure.  This is often from the untreated hemorrhoids rather than the treated one.  Don't be concerned if there is a tablespoon or so of blood.  If there is more blood than this, lie flat with your bottom higher than your head and apply an ice pack to the area. If the bleeding does not stop within a half an hour or if you feel faint, call our office at [phone] or go to the emergency room.  6. Problems are not common; however, if there is a substantial amount of bleeding, severe pain, chills, fever or difficulty passing urine (very rare) or other  problems, you should call us at [phone] or report to the nearest emergency room.  7. Do not stay seated continuously for more than 2-3 hours for a day or two after the procedure.  Tighten your buttock muscles 10-15 times every two hours and take 10-15 deep breaths every 1-2 hours.  Do not spend more than a few minutes on the toilet if you cannot empty your bowel; instead re-visit the toilet at a later time.

## 2015-06-12 NOTE — Progress Notes (Signed)
    The patient presents for follow-up and possible banding. He is previously had all 3 hemorrhoid columns banded, in August the right anterior which was a grade 3 prolapse was banded and in September the left lateral a right posterior were banded. He initially had a very good response and improvement in symptoms after the first banding but since then and after the second banding he has had a recurrence of problems with fecal soiling and protrusion of the hemorrhoids. He is also been working out doing abdominal crunches etc. an effort to try to reduce the size of his abdomen.  He has soft easy bowel movements without difficulty. Other than the cleansing issues. He is try to avoid over wiping. He is sometimes using the hand-held shower to clean.  Rectal exam reveals a protruding right anterior hemorrhoid in the canal. There are some external components as well as internal seen. Digital exam reveals decreased anal tone, no mass.   Anoscopy was performed with the patient in the left lateral decubitus position and revealed a grade 2-3 right anterior internal hemorrhoid, grade 2 left lateral internal hemorrhoid and grade 1 right posterior hemorrhoid. There are external components in the left lateral and right anterior position as well.  PROCEDURE NOTE: The patient presents with symptomatic grade 2-3  hemorrhoids, requesting rubber band ligation of his/her hemorrhoidal disease.   All risks, benefits and alternative forms of therapy were described and informed consent was obtained.   The decision was made to band the right anterior and left lateral internal hemorrhoids, and the [place] was used to perform band ligation without complication.  Digital anorectal examination was then performed to assure proper positioning of the band, and to adjust the banded tissue as required.  The patient was discharged home without pain or other issues.  Dietary and behavioral recommendations were given and  along with follow-up instructions.     The following adjunctive treatments were recommended:  Continue fiber supplementation plus or minus MiraLAX  The patient will return in 6-8 weeks for  follow-up and possible additional banding as required. No complications were encountered and the patient tolerated the procedure well.  I appreciate the opportunity to care for this patient. CC: Ronita Hipps, MD

## 2015-06-12 NOTE — Assessment & Plan Note (Addendum)
RA and LL banded Ext component also Continue fiber Stop abd crunches

## 2015-08-11 ENCOUNTER — Encounter: Payer: Self-pay | Admitting: Internal Medicine

## 2015-08-11 ENCOUNTER — Ambulatory Visit (INDEPENDENT_AMBULATORY_CARE_PROVIDER_SITE_OTHER): Payer: Medicare Other | Admitting: Internal Medicine

## 2015-08-11 VITALS — BP 124/88 | HR 88 | Ht 72.0 in | Wt 230.1 lb

## 2015-08-11 DIAGNOSIS — Z1211 Encounter for screening for malignant neoplasm of colon: Secondary | ICD-10-CM | POA: Diagnosis not present

## 2015-08-11 DIAGNOSIS — K648 Other hemorrhoids: Secondary | ICD-10-CM | POA: Diagnosis not present

## 2015-08-11 DIAGNOSIS — K642 Third degree hemorrhoids: Secondary | ICD-10-CM

## 2015-08-11 NOTE — Patient Instructions (Signed)
  You have been scheduled for a colonoscopy. Please follow written instructions given to you at your visit today.  Please pick up your over the counter prep supplies at the pharmacy . If you use inhalers (even only as needed), please bring them with you on the day of your procedure. Your physician has requested that you go to www.startemmi.com and enter the access code given to you at your visit today. This web site gives a general overview about your procedure. However, you should still follow specific instructions given to you by our office regarding your preparation for the procedure.   You have been scheduled for an appointment with Dr Leighton Ruff at [facility]. Your appointment is on 08/30/2015 at 9:00AM. Please arrive at 8:30AM for registration. Make certain to bring a list of current medications, including any over the counter medications or vitamins. Also bring your co-pay if you have one as well as your insurance cards. [facility] is located at [address], [address]. Should you need to reschedule your appointment, please contact them at [phone].   Please increase your psyllium to 2 tablespoons daily and take 2 doses of Miralax daily.    I appreciate the opportunity to care for you. Silvano Rusk, MD, [facility]

## 2015-08-11 NOTE — Assessment & Plan Note (Signed)
RA still visible Still w/ sxs Refer to Dr. Marcello Moores [facility] ? Surgical Tx

## 2015-08-11 NOTE — Progress Notes (Signed)
Subjective:    Patient ID: Travis Reese, male    DOB: May 29, 1946, 69 y.o.   MRN: [id number] Chief complaint: Hemorrhoids, follow-up HPI The patient is here for follow-up of hemorrhoids. He still has difficulty with these, he is having incomplete defecation and prolapse of the hemorrhoids. He has had the right anterior he has had right anterior and left lateral internal hemorrhoid banding twice, and right posterior one time. This began in August and most recently was done in October. He is on psyllium 1 tablespoon daily and MiraLAX 1 dose daily. He will defecate but had difficulty having a good effective bowel movement and then more have urge to defecate again after this occurs. It does not sound like those symptoms are much better with treatment of the hemorrhoids and he still has the prolapse. No Known Allergies Outpatient Prescriptions Prior to Visit  Medication Sig Dispense Refill  . albuterol (PROVENTIL HFA;VENTOLIN HFA) 108 (90 BASE) MCG/ACT inhaler Inhale 2 puffs into the lungs every 6 (six) hours as needed for wheezing or shortness of breath. 1 Inhaler 0  . alfuzosin (UROXATRAL) 10 MG 24 hr tablet Take 10 mg by mouth daily after supper.    . ClonazePAM (KLONOPIN PO) Take by mouth.    Marland Kitchen lisinopril-hydrochlorothiazide (PRINZIDE,ZESTORETIC) 20-25 MG per tablet Take 1 tablet by mouth daily.    . meloxicam (MOBIC) 7.5 MG tablet Take 7.5 mg by mouth daily.    . naproxen (NAPROSYN) 500 MG tablet Take 500 mg by mouth 2 (two) times daily with a meal.    . Psyllium (REGULOID PO) Take by mouth.    . Wheat Dextrin (BENEFIBER) POWD Take by mouth. 3 tablespoons daily     No facility-administered medications prior to visit.   Past Medical History  Diagnosis Date  . Benign prostatic hyperplasia with elevated prostate specific antigen (PSA)   . Anxiety   . Arthritis   . Pneumonia   . Hypertension   . Prolapsed internal hemorrhoids, grade 3 04/19/2015   Past Surgical History  Procedure  Laterality Date  . Sinus surgery with instatrak    . Wisdom tooth extraction    . Colonoscopy    . Hemorrhoid banding     Social History   Social History  . Marital Status: Unknown    Spouse Name: N/A  . Number of Children: 2  . Years of Education: N/A   Occupational History  . volunteer work    Social History Main Topics  . Smoking status: Current Every Day Smoker -- 0.50 packs/day    Types: Cigarettes  . Smokeless tobacco: Never Used     Comment: formerly quit until 5/16 started again  . Alcohol Use: 0.0 oz/week    0 Standard drinks or equivalent per week     Comment: occasionally  . Drug Use: No  . Sexual Activity: Not Asked   Other Topics Concern  . None   Social History Narrative   Divorced lives with father after incarceration x 5 yrs   1 son, 1 daughter   Volunteer work   3 caffeine/day   04/19/2015   Family History  Problem Relation Age of Onset  . Hypertension Sister   . Colon polyps Father   . Breast cancer Sister   . Prostate cancer Father   . Prostate cancer Paternal Uncle     Review of Systems As above    Objective:   Physical Exam BP 124/88 mmHg  Pulse 88  Ht 6' (1.829 m)  [facility]  230 lb 2 oz (104.384 kg)  BMI 31.20 kg/m2 Inspection of the rectal area shows protruding right anterior internal hemorrhoid, grade 3 prolapse. There is visible laxity to the sphincter region as well.     Assessment & Plan:   1. Prolapsed internal hemorrhoids, grade 3   2. Colon cancer screening    Prolapsed internal hemorrhoids, grade 3 RA still visible Still w/ sxs Refer to Dr. Marcello Moores [facility] ? Surgical Tx   we'll schedule screening colonoscopy also. I think I done as much as I can for him with banding which she wanted to try prior to a surgical referral.The risks and benefits as well as alternatives of endoscopic procedure(s) have been discussed and reviewed. All questions answered. The patient agrees to proceed.   CC: Ronita Hipps, MD

## 2015-09-18 ENCOUNTER — Other Ambulatory Visit: Payer: Self-pay | Admitting: General Surgery

## 2015-09-18 NOTE — H&P (Signed)
History of Present Illness Leighton Ruff MD; [id number] 1:46 PM) Patient words: hems.  The patient is a 70 year old male who presents with hemorrhoids. 70 year old male who presents to the office as a referral from Dr. Carlean Purl. He reports a long-standing history of difficulty with incomplete evacuation. On exam in the gastroenterology office he was noted to have multiple grade 2 and 3 hemorrhoids. He is underwent banding on several occasions with no real change in his symptoms. He is having regular soft bowel movements with the help of a fiber supplement and MiraLAX daily. Even with these treatments, he still complains of incomplete evacuation and occasional prolapse. He denies any rectal bleeding. He is due for a screening colonoscopy, and this will be done in February by Dr. Carlean Purl.   Other Problems Mammie Lorenzo, LPN; QA348G D34-534 PM) Anxiety Disorder Arthritis Back Pain Bladder Problems Enlarged Prostate Hemorrhoids High blood pressure  Past Surgical History Mammie Lorenzo, LPN; QA348G D34-534 PM) No pertinent past surgical history  Diagnostic Studies History Mammie Lorenzo, LPN; QA348G D34-534 PM) Colonoscopy >10 years ago  Allergies Mammie Lorenzo, LPN; QA348G 579FGE PM) No Known Drug Allergies 09/18/2015  Medication History Mammie Lorenzo, LPN; QA348G 579FGE PM) Lisinopril-Hydrochlorothiazide (20-25MG  Tablet, Oral) Active. Naproxen (500MG  Tablet, Oral) Active. Methocarbamol (500MG  Tablet, Oral) Active. ClonazePAM (1MG  Tablet, Oral) Active. Ventolin HFA (108 (90 Base)MCG/ACT Aerosol Soln, Inhalation) Active.  Social History Mammie Lorenzo, LPN; QA348G D34-534 PM) Alcohol use Remotely quit alcohol use. Caffeine use Coffee. Illicit drug use Remotely quit drug use. Tobacco use Current every day smoker.  Family History Mammie Lorenzo, LPN; QA348G D34-534 PM) Alcohol Abuse Sister, Son. Arthritis Father, Sister. Breast Cancer  Sister. Hypertension Brother, Daughter, Father, Sister, Son. Melanoma Father. Migraine Headache Daughter. Prostate Cancer Family Members In General, Father. Respiratory Condition Mother. Thyroid problems Daughter.     Review of Systems Mammie Lorenzo LPN; QA348G D34-534 PM) General Present- Fatigue. Not Present- Appetite Loss, Chills, Fever, Night Sweats, Weight Gain and Weight Loss. Skin Not Present- Change in Wart/Mole, Dryness, Hives, Jaundice, New Lesions, Non-Healing Wounds, Rash and Ulcer. HEENT Present- Hearing Loss, Hoarseness, Ringing in the Ears, Seasonal Allergies and Sinus Pain. Not Present- Earache, Nose Bleed, Oral Ulcers, Sore Throat, Visual Disturbances, Wears glasses/contact lenses and Yellow Eyes. Respiratory Present- Chronic Cough, Difficulty Breathing and Wheezing. Not Present- Bloody sputum and Snoring. Breast Not Present- Breast Mass, Breast Pain, Nipple Discharge and Skin Changes. Cardiovascular Present- Shortness of Breath. Not Present- Chest Pain, Difficulty Breathing Lying Down, Leg Cramps, Palpitations, Rapid Heart Rate and Swelling of Extremities. Gastrointestinal Present- Excessive gas and Hemorrhoids. Not Present- Abdominal Pain, Bloating, Bloody Stool, Change in Bowel Habits, Chronic diarrhea, Constipation, Difficulty Swallowing, Gets full quickly at meals, Indigestion, Nausea, Rectal Pain and Vomiting. Male Genitourinary Present- Frequency, Nocturia and Urgency. Not Present- Blood in Urine, Change in Urinary Stream, Impotence, Painful Urination and Urine Leakage. Musculoskeletal Present- Back Pain, Joint Pain, Joint Stiffness, Muscle Pain and Muscle Weakness. Not Present- Swelling of Extremities. Neurological Present- Weakness. Not Present- Decreased Memory, Fainting, Headaches, Numbness, Seizures, Tingling, Tremor and Trouble walking. Psychiatric Present- Anxiety. Not Present- Bipolar, Change in Sleep Pattern, Depression, Fearful and Frequent  crying. Hematology Present- Easy Bruising. Not Present- Excessive bleeding, Gland problems, HIV and Persistent Infections.  Vitals Claiborne Billings Dockery LPN; QA348G 579FGE PM) 09/18/2015 1:34 PM Weight: 224.2 lb Height: 74in Body Surface Area: 2.28 m Body Mass Index: 28.79 kg/m  Temp.: 98.46F(Oral)  Pulse: 88 (Regular)  BP: 124/82 (Sitting, Left Arm, Standard)  Physical Exam Leighton Ruff MD; [id number] 1:56 PM)  General Mental Status-Alert. General Appearance-Not in acute distress. Build & Nutrition-Well nourished. Posture-Normal posture. Gait-Normal.  Head and Neck Head-normocephalic, atraumatic with no lesions or palpable masses. Trachea-midline.  Chest and Lung Exam Chest and lung exam reveals -on auscultation, normal breath sounds, no adventitious sounds and normal vocal resonance.  Cardiovascular Cardiovascular examination reveals -normal heart sounds, regular rate and rhythm with no murmurs.  Abdomen Inspection Inspection of the abdomen reveals - No Hernias. Palpation/Percussion Palpation and Percussion of the abdomen reveal - Soft, Non Tender, No Rigidity (guarding), No hepatosplenomegaly and No Palpable abdominal masses.  Rectal Anorectal Exam External - Note: decreased tone, prolapsed LL and RA internal hemorrhoids. Internal - Note: decreased tone, normal squeeze.  Neurologic Neurologic evaluation reveals -alert and oriented x 3 with no impairment of recent or remote memory, normal attention span and ability to concentrate, normal sensation and normal coordination.  Musculoskeletal Normal Exam - Bilateral-Upper Extremity Strength Normal and Lower Extremity Strength Normal.   Results Leighton Ruff MD; [id number] 2:14 PM) Procedures  Name Value Date Hemorrhoids Procedure Other: Procedure: Anoscopy Surgeon: Marcello Moores After the risks and benefits were explained, verbal consent was obtained for above procedure. A medical  assistant chaperone was present thoroughout the entire procedure. Anesthesia: none Diagnosis: rectal bleeding Findings: Decreased rectal tone, good squeeze, grade 3 left lateral right anterior internal hemorrhoids. Grade 2 right posterior hemorrhoid, significant prolapse  Performed: 09/18/2015 2:12 PM    Assessment & Plan Leighton Ruff MD; [id number] 2:12 PM)  PROLAPSED INTERNAL HEMORRHOIDS, GRADE 3 [id number]) Impression: 70 year old male with a history of incomplete defecation and grade 2 and 3 hemorrhoids. He has failed rubber band ligation after several attempts. He is made dietary changes as well with no change in his symptoms. I have recommended that he undergo Fithian to help with his prolapse. We will plan on doing this in March after his colonoscopy.

## 2015-10-16 ENCOUNTER — Telehealth: Payer: Self-pay | Admitting: Internal Medicine

## 2015-10-16 ENCOUNTER — Encounter: Payer: Self-pay | Admitting: Internal Medicine

## 2015-10-16 NOTE — Telephone Encounter (Signed)
Patient sick with sinus infection, productive cough and wants to reschedule his procedure for 10/19/15.  Transferred to [facility] to help [place] with that.

## 2015-10-19 ENCOUNTER — Encounter: Payer: Medicare Other | Admitting: Internal Medicine

## 2015-11-09 ENCOUNTER — Encounter (HOSPITAL_BASED_OUTPATIENT_CLINIC_OR_DEPARTMENT_OTHER): Payer: Self-pay | Admitting: *Deleted

## 2015-11-09 NOTE — Progress Notes (Signed)
NPO AFTER MN.  ARRIVE AT 0730.  NEEDS ISTAT AND EKG.   

## 2015-11-15 ENCOUNTER — Other Ambulatory Visit: Payer: Self-pay

## 2015-11-15 ENCOUNTER — Ambulatory Visit (HOSPITAL_BASED_OUTPATIENT_CLINIC_OR_DEPARTMENT_OTHER)
Admission: RE | Admit: 2015-11-15 | Discharge: 2015-11-15 | Disposition: A | Discharge status: Home / Self Care | Payer: Medicare HMO | Source: Ambulatory Visit | Attending: General Surgery | Admitting: General Surgery

## 2015-11-15 ENCOUNTER — Ambulatory Visit (HOSPITAL_BASED_OUTPATIENT_CLINIC_OR_DEPARTMENT_OTHER): Payer: Medicare HMO | Admitting: Anesthesiology

## 2015-11-15 ENCOUNTER — Encounter (HOSPITAL_BASED_OUTPATIENT_CLINIC_OR_DEPARTMENT_OTHER)
Admission: RE | Disposition: A | Discharge status: Home / Self Care | Payer: Self-pay | Source: Ambulatory Visit | Attending: General Surgery

## 2015-11-15 ENCOUNTER — Encounter (HOSPITAL_BASED_OUTPATIENT_CLINIC_OR_DEPARTMENT_OTHER): Payer: Self-pay | Admitting: *Deleted

## 2015-11-15 DIAGNOSIS — Z79899 Other long term (current) drug therapy: Secondary | ICD-10-CM | POA: Insufficient documentation

## 2015-11-15 DIAGNOSIS — M1990 Unspecified osteoarthritis, unspecified site: Secondary | ICD-10-CM | POA: Insufficient documentation

## 2015-11-15 DIAGNOSIS — I10 Essential (primary) hypertension: Secondary | ICD-10-CM | POA: Insufficient documentation

## 2015-11-15 DIAGNOSIS — Z791 Long term (current) use of non-steroidal anti-inflammatories (NSAID): Secondary | ICD-10-CM | POA: Insufficient documentation

## 2015-11-15 DIAGNOSIS — F419 Anxiety disorder, unspecified: Secondary | ICD-10-CM | POA: Insufficient documentation

## 2015-11-15 DIAGNOSIS — K642 Third degree hemorrhoids: Secondary | ICD-10-CM | POA: Insufficient documentation

## 2015-11-15 DIAGNOSIS — F17200 Nicotine dependence, unspecified, uncomplicated: Secondary | ICD-10-CM | POA: Insufficient documentation

## 2015-11-15 HISTORY — DX: Elevated prostate specific antigen (PSA): R97.20

## 2015-11-15 HISTORY — PX: TRANSANAL HEMORRHOIDAL DEARTERIALIZATION: SHX6136

## 2015-11-15 HISTORY — DX: Benign prostatic hyperplasia with lower urinary tract symptoms: N40.1

## 2015-11-15 HISTORY — DX: Presence of external hearing-aid: Z97.4

## 2015-11-15 LAB — POCT I-STAT 4, (NA,K, GLUC, HGB,HCT)
Glucose, Bld: 105 mg/dL — ABNORMAL HIGH (ref 65–99)
HCT: 46 % (ref 39.0–52.0)
Hemoglobin: 15.6 g/dL (ref 13.0–17.0)
Potassium: 3.9 mmol/L (ref 3.5–5.1)
Sodium: 139 mmol/L (ref 135–145)

## 2015-11-15 SURGERY — TRANSANAL HEMORRHOIDAL DEARTERIALIZATION
Anesthesia: Monitor Anesthesia Care | Site: Rectum

## 2015-11-15 MED ORDER — FENTANYL CITRATE (PF) 250 MCG/5ML IJ SOLN
INTRAMUSCULAR | Status: AC
  Filled 2015-11-15: qty 5

## 2015-11-15 MED ORDER — SODIUM CHLORIDE 0.9 % IV SOLN
250.0000 mL | INTRAVENOUS | Status: DC | PRN
Start: 2015-11-15 — End: 2015-11-15
  Filled 2015-11-15: qty 250

## 2015-11-15 MED ORDER — PROPOFOL 500 MG/50ML IV EMUL
INTRAVENOUS | Status: AC
  Filled 2015-11-15: qty 50

## 2015-11-15 MED ORDER — MIDAZOLAM HCL 2 MG/2ML IJ SOLN
INTRAMUSCULAR | Status: AC
  Filled 2015-11-15: qty 4

## 2015-11-15 MED ORDER — SODIUM CHLORIDE 0.9 % IR SOLN
Status: DC | PRN
  Administered 2015-11-15: 500 mL

## 2015-11-15 MED ORDER — MIDAZOLAM HCL 5 MG/5ML IJ SOLN
INTRAMUSCULAR | Status: DC | PRN
  Administered 2015-11-15: 2 mg via INTRAVENOUS

## 2015-11-15 MED ORDER — LACTATED RINGERS IV SOLN
INTRAVENOUS | Status: DC
  Administered 2015-11-15 (×2): via INTRAVENOUS
  Filled 2015-11-15: qty 1000

## 2015-11-15 MED ORDER — SODIUM CHLORIDE 0.9% FLUSH
3.0000 mL | INTRAVENOUS | Status: DC | PRN
  Filled 2015-11-15: qty 3

## 2015-11-15 MED ORDER — FENTANYL CITRATE (PF) 100 MCG/2ML IJ SOLN
INTRAMUSCULAR | Status: DC | PRN
  Administered 2015-11-15 (×10): 25 ug via INTRAVENOUS

## 2015-11-15 MED ORDER — LIDOCAINE HCL (CARDIAC) 20 MG/ML IV SOLN
INTRAVENOUS | Status: AC
  Filled 2015-11-15: qty 5

## 2015-11-15 MED ORDER — ONDANSETRON HCL 4 MG/2ML IJ SOLN
INTRAMUSCULAR | Status: AC
  Filled 2015-11-15: qty 2

## 2015-11-15 MED ORDER — SODIUM CHLORIDE 0.9 % IJ SOLN
INTRAMUSCULAR | Status: DC | PRN
  Administered 2015-11-15: 10 mL

## 2015-11-15 MED ORDER — PROPOFOL 500 MG/50ML IV EMUL
INTRAVENOUS | Status: DC | PRN
  Administered 2015-11-15: 50 ug/kg/min via INTRAVENOUS

## 2015-11-15 MED ORDER — HYDROMORPHONE HCL 1 MG/ML IJ SOLN
0.2500 mg | INTRAMUSCULAR | Status: DC | PRN
  Filled 2015-11-15: qty 1

## 2015-11-15 MED ORDER — OXYCODONE HCL 5 MG PO TABS
5.0000 mg | ORAL_TABLET | ORAL | Status: DC | PRN
Start: 2015-11-15 — End: 2015-11-15
  Filled 2015-11-15: qty 2

## 2015-11-15 MED ORDER — OXYCODONE HCL 5 MG PO TABS
5.0000 mg | ORAL_TABLET | Freq: Once | ORAL | Status: DC | PRN
  Filled 2015-11-15: qty 1

## 2015-11-15 MED ORDER — DEXAMETHASONE SODIUM PHOSPHATE 10 MG/ML IJ SOLN
INTRAMUSCULAR | Status: AC
  Filled 2015-11-15: qty 1

## 2015-11-15 MED ORDER — OXYCODONE HCL 5 MG/5ML PO SOLN
5.0000 mg | Freq: Once | ORAL | Status: DC | PRN
  Filled 2015-11-15: qty 5

## 2015-11-15 MED ORDER — BUPIVACAINE LIPOSOME 1.3 % IJ SUSP
INTRAMUSCULAR | Status: DC | PRN
  Administered 2015-11-15: 20 mL

## 2015-11-15 MED ORDER — LIDOCAINE 5 % EX OINT
TOPICAL_OINTMENT | CUTANEOUS | Status: DC | PRN
  Administered 2015-11-15: 1

## 2015-11-15 MED ORDER — MEPERIDINE HCL 25 MG/ML IJ SOLN
6.2500 mg | INTRAMUSCULAR | Status: DC | PRN
  Filled 2015-11-15: qty 1

## 2015-11-15 MED ORDER — ONDANSETRON HCL 4 MG/2ML IJ SOLN
INTRAMUSCULAR | Status: DC | PRN
  Administered 2015-11-15: 4 mg via INTRAVENOUS

## 2015-11-15 MED ORDER — BUPIVACAINE-EPINEPHRINE 0.5% -1:200000 IJ SOLN
INTRAMUSCULAR | Status: DC | PRN
  Administered 2015-11-15: 30 mL

## 2015-11-15 MED ORDER — PROPOFOL 10 MG/ML IV BOLUS
INTRAVENOUS | Status: DC | PRN
  Administered 2015-11-15 (×2): 40 mg via INTRAVENOUS

## 2015-11-15 MED ORDER — OXYCODONE HCL 5 MG PO TABS: 5.0000 mg | ORAL_TABLET | ORAL | Status: DC | PRN

## 2015-11-15 MED ORDER — ACETAMINOPHEN 650 MG RE SUPP
650.0000 mg | RECTAL | Status: DC | PRN
  Filled 2015-11-15: qty 1

## 2015-11-15 MED ORDER — ACETAMINOPHEN 325 MG PO TABS
650.0000 mg | ORAL_TABLET | ORAL | Status: DC | PRN
  Filled 2015-11-15: qty 2

## 2015-11-15 MED ORDER — SODIUM CHLORIDE 0.9% FLUSH
3.0000 mL | Freq: Two times a day (BID) | INTRAVENOUS | Status: DC
  Filled 2015-11-15: qty 3

## 2015-11-15 MED ORDER — LIDOCAINE HCL (CARDIAC) 20 MG/ML IV SOLN
INTRAVENOUS | Status: DC | PRN
  Administered 2015-11-15: 60 mg via INTRAVENOUS

## 2015-11-15 MED ORDER — DEXAMETHASONE SODIUM PHOSPHATE 4 MG/ML IJ SOLN
INTRAMUSCULAR | Status: DC | PRN
  Administered 2015-11-15: 10 mg via INTRAVENOUS

## 2015-11-15 SURGICAL SUPPLY — 36 items
BLADE HEX COATED 2.75 (ELECTRODE) IMPLANT
BRIEF STRETCH FOR OB PAD LRG (UNDERPADS AND DIAPERS) IMPLANT
COVER BACK TABLE 60X90IN (DRAPES) ×2 IMPLANT
DRAPE LG THREE QUARTER DISP (DRAPES) ×2 IMPLANT
DRAPE UNDERBUTTOCKS STRL (DRAPE) ×2 IMPLANT
DRSG PAD ABDOMINAL 8X10 ST (GAUZE/BANDAGES/DRESSINGS) ×2 IMPLANT
ELECT REM PT RETURN 9FT ADLT (ELECTROSURGICAL) ×2
ELECTRODE REM PT RTRN 9FT ADLT (ELECTROSURGICAL) ×1 IMPLANT
GAUZE SPONGE 4X4 12PLY STRL (GAUZE/BANDAGES/DRESSINGS) IMPLANT
GAUZE SPONGE 4X4 16PLY XRAY LF (GAUZE/BANDAGES/DRESSINGS) IMPLANT
GLOVE BIO SURGEON STRL SZ 6.5 (GLOVE) ×6 IMPLANT
GOWN STRL REUS W/ TWL LRG LVL3 (GOWN DISPOSABLE) ×2 IMPLANT
GOWN STRL REUS W/TWL 2XL LVL3 (GOWN DISPOSABLE) ×2 IMPLANT
GOWN STRL REUS W/TWL LRG LVL3 (GOWN DISPOSABLE) ×2
HEMOSTAT SURGICEL 4X8 (HEMOSTASIS) IMPLANT
KIT ROOM TURNOVER WOR (KITS) ×2 IMPLANT
KIT SLIDE ONE PROLAPS HEMORR (KITS) ×2 IMPLANT
LEGGING LITHOTOMY PAIR STRL (DRAPES) ×2 IMPLANT
LUBRICANT JELLY K Y 4OZ (MISCELLANEOUS) ×2 IMPLANT
MANIFOLD NEPTUNE II (INSTRUMENTS) IMPLANT
NEEDLE HYPO 22GX1.5 SAFETY (NEEDLE) ×2 IMPLANT
PACK BASIN DAY SURGERY FS (CUSTOM PROCEDURE TRAY) ×2 IMPLANT
PAD ARMBOARD 7.5X6 YLW CONV (MISCELLANEOUS) IMPLANT
PENCIL BUTTON HOLSTER BLD 10FT (ELECTRODE) IMPLANT
SPONGE GAUZE 4X4 12PLY STER LF (GAUZE/BANDAGES/DRESSINGS) ×2 IMPLANT
SPONGE HEMORRHOID 8X3CM (HEMOSTASIS) IMPLANT
SUT CHROMIC 2 0 SH (SUTURE) IMPLANT
SUT CHROMIC 3 0 SH 27 (SUTURE) IMPLANT
SUT VIC AB 2-0 UR6 27 (SUTURE) IMPLANT
SYR CONTROL 10ML LL (SYRINGE) ×2 IMPLANT
SYRINGE 20CC LL (MISCELLANEOUS) IMPLANT
TOWEL OR 17X24 6PK STRL BLUE (TOWEL DISPOSABLE) ×2 IMPLANT
TOWEL OR NON WOVEN STRL DISP B (DISPOSABLE) ×2 IMPLANT
TRAY DSU PREP LF (CUSTOM PROCEDURE TRAY) ×2 IMPLANT
TUBE CONNECTING 12X1/4 (SUCTIONS) ×2 IMPLANT
YANKAUER SUCT BULB TIP NO VENT (SUCTIONS) ×2 IMPLANT

## 2015-11-15 NOTE — Discharge Instructions (Addendum)
ANORECTAL SURGERY: POST OP INSTRUCTIONS °1. Take your usually prescribed home medications unless otherwise directed. °2. DIET: During the first few hours after surgery sip on some liquids until you are able to urinate.  It is normal to not urinate for several hours after this surgery.  If you feel uncomfortable, please contact the office for instructions.  After you are able to urinate,you may eat, if you feel like it.  Follow a light bland diet the first 24 hours after arrival home, such as soup, liquids, crackers, etc.  Be sure to include lots of fluids daily (6-8 glasses).  Avoid fast food or heavy meals, as your are more likely to get nauseated.  Eat a low fat diet the next few days after surgery.  Limit caffeine intake to 1-2 servings a day. °3. PAIN CONTROL: °a. Pain is best controlled by a usual combination of several different methods TOGETHER: °i. Muscle relaxation: Soak in a warm bath (or Sitz bath) three times a day and after bowel movements.  Continue to do this until all pain is resolved. °ii. Over the counter pain medication °iii. Prescription pain medication °b. Most patients will experience some swelling and discomfort in the anus/rectal area and incisions.  Heat such as warm towels, sitz baths, warm baths, etc to help relax tight/sore spots and speed recovery.  Some people prefer to use ice, especially in the first couple days after surgery, as it may decrease the pain and swelling, or alternate between ice & heat.  Experiment to what works for you.  Swelling and bruising can take several weeks to resolve.  Pain can take even longer to completely resolve. °c. It is helpful to take an over-the-counter pain medication regularly for the first few weeks.  Choose one of the following that works best for you: °i. Naproxen (Aleve, etc)  Two 220mg tabs twice a day °ii. Ibuprofen (Advil, etc) Three 200mg tabs four times a day (every meal & bedtime) °d. A  prescription for pain medication (such as percocet,  oxycodone, hydrocodone, etc) should be given to you upon discharge.  Take your pain medication as prescribed.  °i. If you are having problems/concerns with the prescription medicine (does not control pain, nausea, vomiting, rash, itching, etc), please call us [phone] to see if we need to switch you to a different pain medicine that will work better for you and/or control your side effect better. °ii. If you need a refill on your pain medication, please contact your pharmacy.  They will contact our office to request authorization. Prescriptions will not be filled after 5 pm or on week-ends. °4. KEEP YOUR BOWELS REGULAR and AVOID CONSTIPATION °a. The goal is one to two soft bowel movements a day.  You should at least have a bowel movement every other day. °b. Avoid getting constipated.  Between the surgery and the pain medications, it is common to experience some constipation. This can be very painful after rectal surgery.  Increasing fluid intake and taking a fiber supplement (such as Metamucil, Citrucel, FiberCon, etc) 1-2 times a day regularly will usually help prevent this problem from occurring.  A stool softener like colace is also recommended.  This can be purchased over the counter at your pharmacy.  You can take it up to 3 times a day.  If you do not have a bowel movement after 24 hrs since your surgery, take one does of milk of magnesia.  If you still haven't had a bowel movement 8-12 hours after   that dose, take another dose.  If you don't have a bowel movement 48 hrs after surgery, purchase a Fleets enema from the drug store and administer gently per package instructions.  If you still are having trouble with your bowel movements after that, please call the office for further instructions. °c. If you develop diarrhea or have many loose bowel movements, simplify your diet to bland foods & liquids for a few days.  Stop any stool softeners and decrease your fiber supplement.  Switching to mild  anti-diarrheal medications (Kayopectate, Pepto Bismol) can help.  If this worsens or does not improve, please call us. ° °5. Wound Care °a. Remove your bandages before your first bowel movement or 8 hours after surgery.     °b. Remove any wound packing material at this tim,e as well.  You do not need to repack the wound unless instructed otherwise.  Wear an absorbent pad or soft cotton gauze in your underwear to catch any drainage and help keep the area clean. You should change this every 2-3 hours while awake. °c. Keep the area clean and dry.  Bathe / shower every day, especially after bowel movements.  Keep the area clean by showering / bathing over the incision / wound.   It is okay to soak an open wound to help wash it.  Wet wipes or showers / gentle washing after bowel movements is often less traumatic than regular toilet paper. °d. You may have some styrofoam-like soft packing in the rectum which will come out with the first bowel movement.  °e. You will often notice bleeding with bowel movements.  This should slow down by the end of the first week of surgery °f. Expect some drainage.  This should slow down, too, by the end of the first week of surgery.  Wear an absorbent pad or soft cotton gauze in your underwear until the drainage stops. °g. Do Not sit on a rubber or pillow ring.  This can make you symptoms worse.  You may sit on a soft pillow if needed.  °6. ACTIVITIES as tolerated:   °a. You may resume regular (light) daily activities beginning the next day--such as daily self-care, walking, climbing stairs--gradually increasing activities as tolerated.  If you can walk 30 minutes without difficulty, it is safe to try more intense activity such as jogging, treadmill, bicycling, low-impact aerobics, swimming, etc. °b. Save the most intensive and strenuous activity for last such as sit-ups, heavy lifting, contact sports, etc  Refrain from any heavy lifting or straining until you are off narcotics for pain  control.   °c. You may drive when you are no longer taking prescription pain medication, you can comfortably sit for long periods of time, and you can safely maneuver your car and apply brakes. °d. You may have sexual intercourse when it is comfortable.  °7. FOLLOW UP in our office °a. Please call [facility] at [phone] to set up an appointment to see your surgeon in the office for a follow-up appointment approximately 3-4 weeks after your surgery. °b. Make sure that you call for this appointment the day you arrive home to insure a convenient appointment time. °10. IF YOU HAVE DISABILITY OR FAMILY LEAVE FORMS, BRING THEM TO THE OFFICE FOR PROCESSING.  DO NOT GIVE THEM TO YOUR DOCTOR. ° ° ° ° °WHEN TO CALL US [phone]: °1. Poor pain control °2. Reactions / problems with new medications (rash/itching, nausea, etc)  °3. Fever over 101.5 F (38.5 C) °4.   Inability to urinate 5. Nausea and/or vomiting 6. Worsening swelling or bruising 7. Continued bleeding from incision. 8. Increased pain, redness, or drainage from the incision  The clinic staff is available to answer your questions during regular business hours (8:30am-5pm).  Please dont hesitate to call and ask to speak to one of our nurses for clinical concerns.   A surgeon from [facility] is always on call at the hospitals   If you have a medical emergency, go to the nearest emergency room or call 911.    [facility], [address], [place], [place], [place]  [address] ? MAIN: [phone] ? TOLL FREE: [phone] ? FAX [phone] [email or web address]  Post Anesthesia Home Care Instructions  Activity: Get plenty of rest for the remainder of the day. A responsible adult should stay with you for 24 hours following the procedure.  For the next 24 hours, DO NOT: -Drive a car -Paediatric nurse -Drink alcoholic beverages -Take any medication unless instructed by your physician -Make any  legal decisions or sign important papers.  Meals: Start with liquid foods such as gelatin or soup. Progress to regular foods as tolerated. Avoid greasy, spicy, heavy foods. If nausea and/or vomiting occur, drink only clear liquids until the nausea and/or vomiting subsides. Call your physician if vomiting continues.  Special Instructions/Symptoms: Your throat may feel dry or sore from the anesthesia or the breathing tube placed in your throat during surgery. If this causes discomfort, gargle with warm salt water. The discomfort should disappear within 24 hours.  If you had a scopolamine patch placed behind your ear for the management of post- operative nausea and/or vomiting:  1. The medication in the patch is effective for 72 hours, after which it should be removed.  Wrap patch in a tissue and discard in the trash. Wash hands thoroughly with soap and water. 2. You may remove the patch earlier than 72 hours if you experience unpleasant side effects which may include dry mouth, dizziness or visual disturbances. 3. Avoid touching the patch. Wash your hands with soap and water after contact with the patch.      Information for Discharge Teaching: EXPAREL (bupivacaine liposome injectable suspension)   Your surgeon gave you EXPAREL(bupivacaine) in your surgical incision to help control your pain after surgery.   EXPAREL is a local anesthetic that provides pain relief by numbing the tissue around the surgical site.  EXPAREL is designed to release pain medication over time and can control pain for up to 72 hours.  Depending on how you respond to EXPAREL, you may require less pain medication during your recovery.  Possible side effects:  Temporary loss of sensation or ability to move in the area where bupivacaine was injected.  Nausea, vomiting, constipation  Rarely, numbness and tingling in your mouth or lips, lightheadedness, or anxiety may occur.  Call your doctor right away if you  think you may be experiencing any of these sensations, or if you have other questions regarding possible side effects.  Follow all other discharge instructions given to you by your surgeon or nurse. Eat a healthy diet and drink plenty of water or other fluids.  If you return to the hospital for any reason within 96 hours following the administration of EXPAREL, please inform your health care providers.

## 2015-11-15 NOTE — Op Note (Signed)
11/15/2015  10:05 AM  PATIENT:  Travis Reese  70 y.o. male  Patient Care Team: Ronita Hipps, MD as PCP - General (Family Medicine)  PRE-OPERATIVE DIAGNOSIS:  Grade 3 internal hemorrhoids  POST-OPERATIVE DIAGNOSIS:  Grade 3 internal hemorrhoids  PROCEDURE:  Procedure(s): TRANSANAL HEMORRHOIDAL DEARTERIALIZATION  SURGEON:  Surgeon(s): Leighton Ruff, MD  ASSISTANT: none   ANESTHESIA:   MAC  EBL:  Total I/O In: 200 [I.V.:200] Out: -   DRAINS: none   SPECIMEN:  No Specimen  DISPOSITION OF SPECIMEN:  N/A  COUNTS:  YES  PLAN OF CARE: Discharge to home after PACU  PATIENT DISPOSITION:  PACU - hemodynamically stable.  INDICATION: Grade 3 internal hemorrhoids   OR FINDINGS: Grade 3 hemorrhoids at all 3 positions  DESCRIPTION: the patient was identified in the preoperative holding area and taken to the OR where they were laid supine on the operating room table.  MAC anesthesia was induced without difficulty. SCDs were also noted to be in place prior to the initiation of anesthesia.  The patient was then prepped and draped in the usual sterile fashion.   A surgical timeout was performed indicating the correct patient, procedure, positioning and need for preoperative antibiotics.   I began with a digital rectal examination and then transitioned over to anoscopy to get a sense of the anatomy.  I switched over to the [facility] fiberoptically lit Doppler anocope.   Using the Doppler on the tip of the [place] anoscope, I identified the arterial hemorrhoidal vessels coming in in the classic hexagonal anatomical pattern (right posterior/lateral/anterior, left posterior /lateral/anterior).    I proceeded to ligate the hemorrhoidal arteries. I used a 2-0 Vicryl suture on a UR-6 needle in a figure-of-eight fashion over the signal around 6 cm proximal to the anal verge. I then ran that stitch longitudinally more distally to the white line of [place]. I then tied that stitch down to cause a  hemorrhoidopexy. I did that for all 6 locations.    I redid Doppler anoscopy. I Identified no additional signals.  At completion of this, all hemorrhoids were reduced into the rectum. There is no more prolapse. External anatomy looked normal.  I repeated anoscopy and examination.  Hemostasis was good. I placed a soft Gelfoam cylinder into the rectum. Patient is being extubated go to recovery room.  I am about to discuss the patient's status to the family.

## 2015-11-15 NOTE — Anesthesia Procedure Notes (Signed)
Procedure Name: MAC Date/Time: 11/15/2015 9:02 AM Performed by: Justice Rocher Pre-anesthesia Checklist: Patient identified, Timeout performed, Emergency Drugs available, Suction available and Patient being monitored Patient Re-evaluated:Patient Re-evaluated prior to inductionOxygen Delivery Method: Simple face mask Preoxygenation: Pre-oxygenation with 100% oxygen Intubation Type: IV induction Placement Confirmation: breath sounds checked- equal and bilateral and positive ETCO2

## 2015-11-15 NOTE — Transfer of Care (Signed)
Immediate Anesthesia Transfer of Care Note  Patient: Travis Reese  Procedure(s) Performed: Procedure(s) (LRB): TRANSANAL HEMORRHOIDAL DEARTERIALIZATION (N/A)  Patient Location: PACU  Anesthesia Type: General  Level of Consciousness: awake, sedated, patient cooperative and responds to stimulation  Airway & Oxygen Therapy: Patient Spontanous Breathing and Patient no 02 awake on room air   Post-op Assessment: Report given to PACU RN, Post -op Vital signs reviewed and stable and Patient moving all extremities  Post vital signs: Reviewed and stable  Complications: No apparent anesthesia complications

## 2015-11-15 NOTE — Anesthesia Preprocedure Evaluation (Addendum)
Anesthesia Evaluation  Patient identified by MRN, date of birth, ID band Patient awake    Reviewed: Allergy & Precautions, NPO status , Patient's Chart, lab work & pertinent test results  Airway Mallampati: I  TM Distance: >3 FB Neck ROM: Full    Dental  (+) Teeth Intact, Dental Advisory Given   Pulmonary Current Smoker,    breath sounds clear to auscultation       Cardiovascular hypertension, Pt. on medications  Rhythm:Regular Rate:Normal     Neuro/Psych    GI/Hepatic   Endo/Other    Renal/GU      Musculoskeletal   Abdominal   Peds  Hematology   Anesthesia Other Findings   Reproductive/Obstetrics                            Anesthesia Physical Anesthesia Plan  ASA: II  Anesthesia Plan: MAC   Post-op Pain Management:    Induction: Intravenous  Airway Management Planned: Simple Face Mask  Additional Equipment:   Intra-op Plan:   Post-operative Plan:   Informed Consent: I have reviewed the patients History and Physical, chart, labs and discussed the procedure including the risks, benefits and alternatives for the proposed anesthesia with the patient or authorized representative who has indicated his/her understanding and acceptance.   Dental advisory given  Plan Discussed with: CRNA, Anesthesiologist and Surgeon  Anesthesia Plan Comments:         Anesthesia Quick Evaluation

## 2015-11-15 NOTE — Anesthesia Postprocedure Evaluation (Signed)
Anesthesia Post Note  Patient: Travis Reese  Procedure(s) Performed: Procedure(s) (LRB): TRANSANAL HEMORRHOIDAL DEARTERIALIZATION (N/A)  Patient location during evaluation: PACU Anesthesia Type: General Level of consciousness: awake and alert Pain management: pain level controlled Vital Signs Assessment: post-procedure vital signs reviewed and stable Respiratory status: spontaneous breathing, nonlabored ventilation and respiratory function stable Cardiovascular status: blood pressure returned to baseline and stable Postop Assessment: no signs of nausea or vomiting Anesthetic complications: no    Last Vitals:  Filed Vitals:   11/15/15 0758 11/15/15 1005  BP: 140/86 120/66  Pulse: 82 82  Temp: 36.7 C 36.4 C  Resp: 16 20    Last Pain: There were no vitals filed for this visit.               Brycen Bean A

## 2015-11-15 NOTE — H&P (Signed)
The patient is a 70 year old male who presents with hemorrhoids. 70 year old male who presents to the office as a referral from Dr. Carlean Purl. He reports a long-standing history of difficulty with incomplete evacuation. On exam in the gastroenterology office he was noted to have multiple grade 2 and 3 hemorrhoids. He is underwent banding on several occasions with no real change in his symptoms. He is having regular soft bowel movements with the help of a fiber supplement and MiraLAX daily. Even with these treatments, he still complains of incomplete evacuation and occasional prolapse. He denies any rectal bleeding. He is due for a screening colonoscopy, and this will be done in February by Dr. Carlean Purl.   Other Problems Mammie Lorenzo, LPN; QA348G D34-534 PM) Anxiety Disorder Arthritis Back Pain Bladder Problems Enlarged Prostate Hemorrhoids High blood pressure  Past Surgical History Mammie Lorenzo, LPN; QA348G D34-534 PM) No pertinent past surgical history  Diagnostic Studies History Mammie Lorenzo, LPN; QA348G D34-534 PM) Colonoscopy >10 years ago  Allergies Mammie Lorenzo, LPN; QA348G 579FGE PM) No Known Drug Allergies 09/18/2015  Medication History Mammie Lorenzo, LPN; QA348G 579FGE PM) Lisinopril-Hydrochlorothiazide (20-25MG  Tablet, Oral) Active. Naproxen (500MG  Tablet, Oral) Active. Methocarbamol (500MG  Tablet, Oral) Active. ClonazePAM (1MG  Tablet, Oral) Active. Ventolin HFA (108 (90 Base)MCG/ACT Aerosol Soln, Inhalation) Active.  Social History Mammie Lorenzo, LPN; QA348G D34-534 PM) Alcohol use Remotely quit alcohol use. Caffeine use Coffee. Illicit drug use Remotely quit drug use. Tobacco use Current every day smoker.  Family History Mammie Lorenzo, LPN; QA348G D34-534 PM) Alcohol Abuse Sister, Son. Arthritis Father, Sister. Breast Cancer Sister. Hypertension Brother, Daughter, Father, Sister, Son. Melanoma Father. Migraine Headache  Daughter. Prostate Cancer Family Members In General, Father. Respiratory Condition Mother. Thyroid problems Daughter.     Review of Systems  General Present- Fatigue. Not Present- Appetite Loss, Chills, Fever, Night Sweats, Weight Gain and Weight Loss. Skin Not Present- Change in Wart/Mole, Dryness, Hives, Jaundice, New Lesions, Non-Healing Wounds, Rash and Ulcer. HEENT Present- Hearing Loss, Hoarseness, Ringing in the Ears, Seasonal Allergies and Sinus Pain. Not Present- Earache, Nose Bleed, Oral Ulcers, Sore Throat, Visual Disturbances, Wears glasses/contact lenses and Yellow Eyes. Respiratory Present- Chronic Cough, Difficulty Breathing and Wheezing. Not Present- Bloody sputum and Snoring. Breast Not Present- Breast Mass, Breast Pain, Nipple Discharge and Skin Changes. Cardiovascular Present- Shortness of Breath. Not Present- Chest Pain, Difficulty Breathing Lying Down, Leg Cramps, Palpitations, Rapid Heart Rate and Swelling of Extremities. Gastrointestinal Present- Excessive gas and Hemorrhoids. Not Present- Abdominal Pain, Bloating, Bloody Stool, Change in Bowel Habits, Chronic diarrhea, Constipation, Difficulty Swallowing, Gets full quickly at meals, Indigestion, Nausea, Rectal Pain and Vomiting. Male Genitourinary Present- Frequency, Nocturia and Urgency. Not Present- Blood in Urine, Change in Urinary Stream, Impotence, Painful Urination and Urine Leakage. Musculoskeletal Present- Back Pain, Joint Pain, Joint Stiffness, Muscle Pain and Muscle Weakness. Not Present- Swelling of Extremities. Neurological Present- Weakness. Not Present- Decreased Memory, Fainting, Headaches, Numbness, Seizures, Tingling, Tremor and Trouble walking. Psychiatric Present- Anxiety. Not Present- Bipolar, Change in Sleep Pattern, Depression, Fearful and Frequent crying. Hematology Present- Easy Bruising. Not Present- Excessive bleeding, Gland problems, HIV and Persistent Infections.  BP 140/86 mmHg  Pulse 82   Temp(Src) 98.1 F (36.7 C) (Oral)  Resp 16  Ht 6\' 2"  (1.88 m)  Wt 100.699 kg (222 lb)  BMI 28.49 kg/m2  SpO2 99%   Physical Exam   General Mental Status-Alert. General Appearance-Not in acute distress. Build & Nutrition-Well nourished. Posture-Normal posture. Gait-Normal.  Head and Neck Head-normocephalic, atraumatic with no  lesions or palpable masses. Trachea-midline.  Chest and Lung Exam Chest and lung exam reveals -on auscultation, normal breath sounds, no adventitious sounds and normal vocal resonance.  Cardiovascular Cardiovascular examination reveals -normal heart sounds, regular rate and rhythm with no murmurs.  Abdomen Inspection Inspection of the abdomen reveals - No Hernias. Palpation/Percussion Palpation and Percussion of the abdomen reveal - Soft, Non Tender, No Rigidity (guarding), No hepatosplenomegaly and No Palpable abdominal masses.  Rectal Anorectal Exam External - Note: decreased tone, prolapsed LL and RA internal hemorrhoids. Internal - Note: decreased tone, normal squeeze.  Neurologic Neurologic evaluation reveals -alert and oriented x 3 with no impairment of recent or remote memory, normal attention span and ability to concentrate, normal sensation and normal coordination.  Musculoskeletal Normal Exam - Bilateral-Upper Extremity Strength Normal and Lower Extremity Strength Normal.   : Anoscopy Surgeon: Marcello Moores After the risks and benefits were explained, verbal consent was obtained for above procedure. A medical assistant chaperone was present thoroughout the entire procedure. Anesthesia: none Diagnosis: rectal bleeding Findings: Decreased rectal tone, good squeeze, grade 3 left lateral right anterior internal hemorrhoids. Grade 2 right posterior hemorrhoid, significant prolapse  Performed: 09/18/2015 2:12 PM    Assessment & Plan   PROLAPSED INTERNAL HEMORRHOIDS, GRADE 3 [id number]) Impression: 70 year old male with a  history of incomplete defecation and grade 2 and 3 hemorrhoids. He has failed rubber band ligation after several attempts. He is made dietary changes as well with no change in his symptoms. I have recommended that he undergo Regina to help with his prolapse. We will plan on doing this in March after his colonoscopy.  Risks include pain, bleeding and recurrence.  I believe he understands this and has agreed to proceed.

## 2015-11-16 ENCOUNTER — Encounter (HOSPITAL_BASED_OUTPATIENT_CLINIC_OR_DEPARTMENT_OTHER): Payer: Self-pay | Admitting: General Surgery

## 2015-12-27 ENCOUNTER — Encounter: Payer: Medicare Other | Admitting: Internal Medicine

## 2016-01-05 DIAGNOSIS — B070 Plantar wart: Secondary | ICD-10-CM | POA: Insufficient documentation

## 2016-07-10 DIAGNOSIS — I10 Essential (primary) hypertension: Secondary | ICD-10-CM | POA: Insufficient documentation

## 2016-07-10 DIAGNOSIS — G2581 Restless legs syndrome: Secondary | ICD-10-CM | POA: Insufficient documentation

## 2016-07-10 HISTORY — DX: Essential (primary) hypertension: I10

## 2016-07-11 DIAGNOSIS — J438 Other emphysema: Secondary | ICD-10-CM | POA: Insufficient documentation

## 2016-07-23 DIAGNOSIS — R5383 Other fatigue: Secondary | ICD-10-CM | POA: Insufficient documentation

## 2016-07-23 DIAGNOSIS — J329 Chronic sinusitis, unspecified: Secondary | ICD-10-CM | POA: Insufficient documentation

## 2016-09-23 DIAGNOSIS — E782 Mixed hyperlipidemia: Secondary | ICD-10-CM

## 2016-09-23 HISTORY — DX: Mixed hyperlipidemia: E78.2

## 2016-10-15 DIAGNOSIS — L03211 Cellulitis of face: Secondary | ICD-10-CM | POA: Insufficient documentation

## 2017-06-25 ENCOUNTER — Encounter: Payer: Self-pay | Admitting: Neurology

## 2017-07-17 NOTE — Progress Notes (Signed)
Subjective:   Travis Reese was seen in consultation in the movement disorder clinic at the request of [place], PA-C.  The evaluation is for tremor.  Tremor started approximately a few months ago and is intermittent involves the bilateral UE.  Tremor is most noticeable when he is doing something like putting toothpaste on the toothbrush.  He does state that he has been weaning the klonopin for the last month from tid to qhs, about 3 days per week.   Uses melatonin on the other nights.  There is a family hx of tremor in his father, who was dx with PD at the age of 12.    Affected by caffeine:  No. (2, 16-oz coffee per day) Affected by alcohol:  Doesn't drink EtOH Affected by stress:  May or may not - "feels tense inside" Affected by fatigue:  No. Spills soup if on spoon:  No. Spills glass of liquid if full:  No. Affects ADL's (tying shoes, brushing teeth, etc):  No.  Current/Previously tried tremor medications: on klonopin but worked down from 1 mg tid to prn (about 1 time per week now per patient)  Current medications that may exacerbate tremor:  albuterol, singulair (using something about 2 times per day - doesn't think it changes tremor)  Outside reports reviewed: lab reports, office notes and referral letter/letters.  No Known Allergies  Outpatient Encounter Medications as of 07/21/2017  Medication Sig  . albuterol (PROVENTIL HFA;VENTOLIN HFA) 108 (90 BASE) MCG/ACT inhaler Inhale 2 puffs into the lungs every 6 (six) hours as needed for wheezing or shortness of breath.  . Ascorbic Acid (VITAMIN C) 1000 MG tablet Take 1,000 mg by mouth daily.  Marland Kitchen BIOTIN PO Take daily by mouth.  Marland Kitchen buPROPion (WELLBUTRIN XL) 150 MG 24 hr tablet Take 150 mg daily by mouth.  Marland Kitchen CALCIUM PO Take by mouth.  . clonazePAM (KLONOPIN) 1 MG tablet Take 1 mg by mouth 3 (three) times daily as needed for anxiety.  . DULoxetine (CYMBALTA) 60 MG capsule Take 60 mg daily by mouth.  Marland Kitchen glucosamine-chondroitin  500-400 MG tablet Take 1 tablet 3 (three) times daily by mouth.  Marland Kitchen lisinopril (PRINIVIL,ZESTRIL) 30 MG tablet Take 30 mg daily by mouth.  . MELATONIN PO Take by mouth.  . montelukast (SINGULAIR) 10 MG tablet Take 10 mg daily by mouth.  . naproxen (NAPROSYN) 500 MG tablet Take 500 mg by mouth 2 (two) times daily with a meal.  . Omega-3 Fatty Acids (FISH OIL) 1200 MG CAPS Take 1 capsule by mouth daily.  . polyethylene glycol powder (GLYCOLAX/MIRALAX) powder Take 17 g by mouth as needed.   . tamsulosin (FLOMAX) 0.4 MG CAPS capsule Take 0.4 mg daily by mouth.  . terbinafine (LAMISIL) 250 MG tablet Take 250 mg daily by mouth.  Marland Kitchen tiZANidine (ZANAFLEX) 4 MG tablet Take 8 mg at bedtime by mouth.  . TURMERIC PO Take by mouth.  . [DISCONTINUED] lisinopril-hydrochlorothiazide (PRINZIDE,ZESTORETIC) 20-25 MG per tablet Take 1 tablet by mouth every morning.   . [DISCONTINUED] meloxicam (MOBIC) 7.5 MG tablet Take 7.5 mg by mouth every evening.   . [DISCONTINUED] oxyCODONE (OXY IR/ROXICODONE) 5 MG immediate release tablet Take 1-2 tablets (5-10 mg total) by mouth every 4 (four) hours as needed.  . [DISCONTINUED] silodosin (RAPAFLO) 4 MG CAPS capsule Take 4 mg by mouth every evening.   No facility-administered encounter medications on file as of 07/21/2017.     Past Medical History:  Diagnosis Date  . Anxiety   .  Arthritis    knees, neck, shoulder  . Elevated PSA    denies per patient  . Hyperplasia of prostate with lower urinary tract symptoms (LUTS)   . Hypertension   . 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 Wears hearing aid    bilateral    Past Surgical History:  Procedure Laterality Date  . COLONOSCOPY  2007  . NASAL SINUS SURGERY  2006  . TRANSANAL HEMORRHOIDAL DEARTERIALIZATION N/A 11/15/2015   Performed by Leighton Ruff, MD at [facility]   Socioeconomic History  . Marital status: Unknown    Spouse name: Not on file  . Number of  children: 2  . Years of education: Not on file  . Highest education level: Not on file  Social Needs  . Financial resource strain: Not on file  . Food insecurity - worry: Not on file  . Food insecurity - inability: Not on file  . Transportation needs - medical: Not on file  . Transportation needs - non-medical: Not on file  Occupational History  . Occupation: Psychologist, occupational work  Tobacco Use  . Smoking status: Current Every Day Smoker    Packs/day: 0.50    Years: 50.00    Pack years: 25.00    Types: Cigarettes  . Smokeless tobacco: Never Used  Substance and Sexual Activity  . Alcohol use: No    Alcohol/week: 0.0 oz  . Drug use: No  . Sexual activity: Not on file  Other Topics Concern  . Not on file  Social History Narrative   Divorced lives with father after incarceration x 5 yrs   1 son, 1 daughter   Volunteer work   3 caffeine/day   04/19/2015    Family Status  Relation Name Status  . Mother  Deceased  . Father  Alive  . Sister  Alive  . Brother  Alive  . Sister  Alive  . Annamarie Major  (Not Specified)  . Son  Alive  . Daughter  Alive    Review of Systems A complete 10 system ROS was obtained and was negative apart from what is mentioned.   Objective:   VITALS:   Vitals:   07/21/17 1251  BP: 138/80  Pulse: 88  SpO2: 96%  Weight: 215 lb (97.5 kg)  Height: 6\' 2"  (1.88 m)   Gen:  Appears stated age and in NAD. HEENT:  Normocephalic, atraumatic. The mucous membranes are moist. The superficial temporal arteries are without ropiness or tenderness. Cardiovascular: Regular rate and rhythm. Lungs: Clear to auscultation bilaterally. Neck: There are no carotid bruits noted bilaterally.  NEUROLOGICAL:  Orientation:  The patient is alert and oriented x 3.  Recent and remote memory are intact.  Attention span and concentration are normal.  Able to name objects and repeat without trouble.  Fund of knowledge is appropriate Cranial nerves: There is good facial symmetry. The  pupils are equal round and reactive to light bilaterally. Fundoscopic exam reveals clear disc margins bilaterally. Extraocular muscles are intact and visual fields are full to confrontational testing. Speech is fluent and clear. Soft palate rises symmetrically and there is no tongue deviation. Hearing is intact to conversational tone. Tone: Tone is good throughout. Sensation: Sensation is intact to light touch and pinprick throughout (facial, trunk, extremities). Vibration is decreased at the bilateral big toe but intact at the ankle. There is no extinction with double simultaneous stimulation. There is no sensory dermatomal level identified. Coordination:  The patient  has no dysdiadichokinesia or dysmetria. Motor: Strength is 5/5 in the bilateral upper and lower extremities.  Shoulder shrug is equal bilaterally.  There is no pronator drift.  There are no fasciculations noted. DTR's: Deep tendon reflexes are 2/4 at the bilateral biceps, triceps, brachioradialis, 2+-3/4 at the bilateral patella (with pre-patellar reflexes) and 1/4 at the bilateral achilles.  Plantar responses are downgoing bilaterally. Gait and Station: The patient is able to ambulate without difficulty. The patient is able to heel toe walk without any difficulty. The patient has mild trouble ambulating in a tandem fashion. The patient is able to stand in the Romberg position.   MOVEMENT EXAM: Tremor:  There is tremor in the UE, noted most significantly with action.  The right is just slightly worse than the left.  It is mild.  The patient is able to draw Travis spirals without significant difficulty but tremor is evident bilaterally.  There is no tremor at rest.  The patient is  able to pour water from one glass to another without spilling it but tremor is noted.     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  We discussed that this can  continue to gradually get worse with time.  We discussed that some medications can worsen this, as can caffeine use.  We discussed medication therapy as well as surgical therapy.  Ultimately, the patient decided to try primidone, 50 mg daily.  Risks, benefits, side effects and alternative therapies were discussed.  The opportunity to ask questions was given and they were answered to the best of my ability.  The patient expressed understanding and willingness to follow the outlined treatment protocols.  -he needs labs including TSH, chemistry  -discussed AAN position on tremor and marijuana and did not recommend.      2.  Much greater than 50% of this visit was spent in counseling and coordinating care.  Total face to face time:  45 min.  Mod complexity visit.

## 2017-07-21 ENCOUNTER — Other Ambulatory Visit: Payer: Medicaid Other

## 2017-07-21 ENCOUNTER — Encounter: Payer: Self-pay | Admitting: Neurology

## 2017-07-21 ENCOUNTER — Ambulatory Visit (INDEPENDENT_AMBULATORY_CARE_PROVIDER_SITE_OTHER): Payer: Medicare Other | Admitting: Neurology

## 2017-07-21 VITALS — BP 138/80 | HR 88 | Ht 74.0 in | Wt 215.0 lb

## 2017-07-21 DIAGNOSIS — Z5181 Encounter for therapeutic drug level monitoring: Secondary | ICD-10-CM | POA: Diagnosis not present

## 2017-07-21 DIAGNOSIS — G250 Essential tremor: Secondary | ICD-10-CM

## 2017-07-21 MED ORDER — PRIMIDONE 50 MG PO TABS: 50.0000 mg | ORAL_TABLET | Freq: Every day | ORAL | 1 refills | Status: DC

## 2017-07-21 NOTE — Patient Instructions (Signed)
1. Your provider has requested that you have labwork completed today. Please go to [facility] ([id number]) on the second floor of this building before leaving the office today. You do not need to check in. If you are not called within 15 minutes please check with the front desk.  2. Start Primidone 50 mg tablets. Take 1/2 tablet for 4 nights, then increase to 1 tablet. Prescription has been sent to your pharmacy. The first dose of medication can cause some dizziness/nausea that should go away after the first dose.  

## 2017-07-22 LAB — TSH: TSH: 1.53 mIU/L (ref 0.40–4.50)

## 2017-07-22 LAB — COMPREHENSIVE METABOLIC PANEL
AG Ratio: 1.4 (calc) (ref 1.0–2.5)
ALT: 16 U/L (ref 9–46)
AST: 18 U/L (ref 10–35)
Albumin: 3.9 g/dL (ref 3.6–5.1)
Alkaline phosphatase (APISO): 69 U/L (ref 40–115)
BUN: 13 mg/dL (ref 7–25)
CO2: 25 mmol/L (ref 20–32)
Calcium: 9.3 mg/dL (ref 8.6–10.3)
Chloride: 100 mmol/L (ref 98–110)
Creat: 0.77 mg/dL (ref 0.70–1.25)
Globulin: 2.8 g/dL (calc) (ref 1.9–3.7)
Glucose, Bld: 93 mg/dL (ref 65–99)
Potassium: 4.1 mmol/L (ref 3.5–5.3)
Sodium: 136 mmol/L (ref 135–146)
Total Bilirubin: 0.5 mg/dL (ref 0.2–1.2)
Total Protein: 6.7 g/dL (ref 6.1–8.1)

## 2017-07-28 ENCOUNTER — Telehealth: Payer: Self-pay | Admitting: Neurology

## 2017-07-28 NOTE — Telephone Encounter (Signed)
-----   Message from Alda Berthold, DO sent at 07/22/2017  5:57 PM EST ----- Please notify patient lab are within normal limits.  Thank you.

## 2017-07-28 NOTE — Telephone Encounter (Signed)
Patient made aware.

## 2017-08-15 DIAGNOSIS — J329 Chronic sinusitis, unspecified: Secondary | ICD-10-CM | POA: Diagnosis not present

## 2017-08-29 DIAGNOSIS — J069 Acute upper respiratory infection, unspecified: Secondary | ICD-10-CM | POA: Diagnosis not present

## 2017-09-09 DIAGNOSIS — J329 Chronic sinusitis, unspecified: Secondary | ICD-10-CM | POA: Diagnosis not present

## 2017-09-11 DIAGNOSIS — N402 Nodular prostate without lower urinary tract symptoms: Secondary | ICD-10-CM | POA: Diagnosis not present

## 2017-09-11 DIAGNOSIS — R972 Elevated prostate specific antigen [PSA]: Secondary | ICD-10-CM | POA: Diagnosis not present

## 2017-09-18 DIAGNOSIS — R05 Cough: Secondary | ICD-10-CM | POA: Diagnosis not present

## 2017-09-23 DIAGNOSIS — Z79899 Other long term (current) drug therapy: Secondary | ICD-10-CM | POA: Diagnosis not present

## 2017-09-23 DIAGNOSIS — E785 Hyperlipidemia, unspecified: Secondary | ICD-10-CM | POA: Diagnosis not present

## 2017-09-23 DIAGNOSIS — E559 Vitamin D deficiency, unspecified: Secondary | ICD-10-CM | POA: Diagnosis not present

## 2017-09-24 DIAGNOSIS — S0083XA Contusion of other part of head, initial encounter: Secondary | ICD-10-CM | POA: Diagnosis not present

## 2017-09-24 DIAGNOSIS — S60221A Contusion of right hand, initial encounter: Secondary | ICD-10-CM | POA: Diagnosis not present

## 2017-09-30 DIAGNOSIS — S62300A Unspecified fracture of second metacarpal bone, right hand, initial encounter for closed fracture: Secondary | ICD-10-CM | POA: Diagnosis not present

## 2017-09-30 DIAGNOSIS — S62306A Unspecified fracture of fifth metacarpal bone, right hand, initial encounter for closed fracture: Secondary | ICD-10-CM | POA: Diagnosis not present

## 2017-10-02 DIAGNOSIS — M47812 Spondylosis without myelopathy or radiculopathy, cervical region: Secondary | ICD-10-CM | POA: Diagnosis not present

## 2017-10-09 DIAGNOSIS — M4802 Spinal stenosis, cervical region: Secondary | ICD-10-CM | POA: Diagnosis not present

## 2017-10-09 DIAGNOSIS — M50223 Other cervical disc displacement at C6-C7 level: Secondary | ICD-10-CM | POA: Diagnosis not present

## 2017-10-09 DIAGNOSIS — M4712 Other spondylosis with myelopathy, cervical region: Secondary | ICD-10-CM | POA: Diagnosis not present

## 2017-10-14 DIAGNOSIS — M47812 Spondylosis without myelopathy or radiculopathy, cervical region: Secondary | ICD-10-CM | POA: Diagnosis not present

## 2017-10-16 DIAGNOSIS — S62309A Unspecified fracture of unspecified metacarpal bone, initial encounter for closed fracture: Secondary | ICD-10-CM | POA: Diagnosis not present

## 2017-10-17 DIAGNOSIS — M50123 Cervical disc disorder at C6-C7 level with radiculopathy: Secondary | ICD-10-CM | POA: Diagnosis not present

## 2017-10-17 DIAGNOSIS — M50122 Cervical disc disorder at C5-C6 level with radiculopathy: Secondary | ICD-10-CM | POA: Diagnosis not present

## 2017-10-17 DIAGNOSIS — M47812 Spondylosis without myelopathy or radiculopathy, cervical region: Secondary | ICD-10-CM | POA: Diagnosis not present

## 2017-10-27 ENCOUNTER — Other Ambulatory Visit: Payer: Self-pay | Admitting: Neurology

## 2017-10-27 MED ORDER — PRIMIDONE 50 MG PO TABS: 50.0000 mg | ORAL_TABLET | Freq: Every day | ORAL | 0 refills | Status: DC

## 2017-10-30 DIAGNOSIS — R3915 Urgency of urination: Secondary | ICD-10-CM | POA: Diagnosis not present

## 2017-10-30 DIAGNOSIS — N401 Enlarged prostate with lower urinary tract symptoms: Secondary | ICD-10-CM | POA: Diagnosis not present

## 2017-10-30 DIAGNOSIS — R972 Elevated prostate specific antigen [PSA]: Secondary | ICD-10-CM | POA: Diagnosis not present

## 2017-11-20 ENCOUNTER — Ambulatory Visit: Payer: Medicare Other | Admitting: Neurology

## 2017-11-20 DIAGNOSIS — S62309A Unspecified fracture of unspecified metacarpal bone, initial encounter for closed fracture: Secondary | ICD-10-CM | POA: Diagnosis not present

## 2017-11-24 NOTE — Progress Notes (Deleted)
Subjective:   Travis Reese was seen in consultation in the movement disorder clinic at the request of Ronita Hipps, MD.  The evaluation is for tremor.  Tremor started approximately a few months ago and is intermittent involves the bilateral UE.  Tremor is most noticeable when he is doing something like putting toothpaste on the toothbrush.  He does state that he has been weaning the klonopin for the last month from tid to qhs, about 3 days per week.   Uses melatonin on the other nights.  There is a family hx of tremor in his father, who was dx with PD at the age of 23.    Affected by caffeine:  No. (2, 16-oz coffee per day) Affected by alcohol:  Doesn't drink EtOH Affected by stress:  May or may not - "feels tense inside" Affected by fatigue:  No. Spills soup if on spoon:  No. Spills glass of liquid if full:  No. Affects ADL's (tying shoes, brushing teeth, etc):  No.   11/24/17 update: Patient is seen today in follow-up.  The patient was started on primidone last visit.  Patient reports that ***.  Current/Previously tried tremor medications: on klonopin but worked down from 1 mg tid to prn (about 1 time per week now per patient)  Current medications that may exacerbate tremor:  albuterol, singulair (using something about 2 times per day - doesn't think it changes tremor)  Outside reports reviewed: lab reports, office notes and referral letter/letters.  No Known Allergies  Outpatient Encounter Medications as of 11/25/2017  Medication Sig  . albuterol (PROVENTIL HFA;VENTOLIN HFA) 108 (90 BASE) MCG/ACT inhaler Inhale 2 puffs into the lungs every 6 (six) hours as needed for wheezing or shortness of breath.  . Ascorbic Acid (VITAMIN C) 1000 MG tablet Take 1,000 mg by mouth daily.  Marland Kitchen BIOTIN PO Take daily by mouth.  Marland Kitchen buPROPion (WELLBUTRIN XL) 150 MG 24 hr tablet Take 150 mg daily by mouth.  Marland Kitchen CALCIUM PO Take by mouth.  . clonazePAM (KLONOPIN) 1 MG tablet Take 1 mg by mouth 3 (three) times  daily as needed for anxiety.  . DULoxetine (CYMBALTA) 60 MG capsule Take 60 mg daily by mouth.  Marland Kitchen glucosamine-chondroitin 500-400 MG tablet Take 1 tablet 3 (three) times daily by mouth.  Marland Kitchen lisinopril (PRINIVIL,ZESTRIL) 30 MG tablet Take 30 mg daily by mouth.  . MELATONIN PO Take by mouth.  . montelukast (SINGULAIR) 10 MG tablet Take 10 mg daily by mouth.  . naproxen (NAPROSYN) 500 MG tablet Take 500 mg by mouth 2 (two) times daily with a meal.  . Omega-3 Fatty Acids (FISH OIL) 1200 MG CAPS Take 1 capsule by mouth daily.  . polyethylene glycol powder (GLYCOLAX/MIRALAX) powder Take 17 g by mouth as needed.   . primidone (MYSOLINE) 50 MG tablet Take 1 tablet (50 mg total) by mouth at bedtime.  . tamsulosin (FLOMAX) 0.4 MG CAPS capsule Take 0.4 mg daily by mouth.  . terbinafine (LAMISIL) 250 MG tablet Take 250 mg daily by mouth.  Marland Kitchen tiZANidine (ZANAFLEX) 4 MG tablet Take 8 mg at bedtime by mouth.  . TURMERIC PO Take by mouth.   No facility-administered encounter medications on file as of 11/25/2017.     Past Medical History:  Diagnosis Date  . Anxiety   . Arthritis    knees, neck, shoulder  . Elevated PSA    denies per patient  . Hyperplasia of prostate with lower urinary tract symptoms (LUTS)   .  Hypertension   . 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 Wears hearing aid    bilateral    Past Surgical History:  Procedure Laterality Date  . COLONOSCOPY  2007  . NASAL SINUS SURGERY  2006  . TRANSANAL HEMORRHOIDAL DEARTERIALIZATION N/A 11/15/2015   Procedure: TRANSANAL HEMORRHOIDAL DEARTERIALIZATION;  Surgeon: Leighton Ruff, MD;  Location: [facility];  Service: General;  Laterality: N/A;    Social History   Socioeconomic History  . Marital status: Unknown    Spouse name: Not on file  . Number of children: 2  . Years of education: Not on file  . Highest education level: Not on file  Occupational History  . Occupation: retired    Comment: Museum/gallery curator  Social Needs  . Financial resource strain: Not on file  . Food insecurity:    Worry: Not on file    Inability: Not on file  . Transportation needs:    Medical: Not on file    Non-medical: Not on file  Tobacco Use  . Smoking status: Current Every Day Smoker    Packs/day: 0.50    Years: 50.00    Pack years: 25.00    Types: Cigarettes  . Smokeless tobacco: Never Used  Substance and Sexual Activity  . Alcohol use: No    Alcohol/week: 0.0 oz  . Drug use: No  . Sexual activity: Not on file  Lifestyle  . Physical activity:    Days per week: Not on file    Minutes per session: Not on file  . Stress: Not on file  Relationships  . Social connections:    Talks on phone: Not on file    Gets together: Not on file    Attends religious service: Not on file    Active member of club or organization: Not on file    Attends meetings of clubs or organizations: Not on file    Relationship status: Not on file  . Intimate partner violence:    Fear of current or ex partner: Not on file    Emotionally abused: Not on file    Physically abused: Not on file    Forced sexual activity: Not on file  Other Topics Concern  . Not on file  Social History Narrative   Divorced lives with father after incarceration x 5 yrs   1 son, 1 daughter   Volunteer work   3 caffeine/day   04/19/2015    Family Status  Relation Name Status  . Mother  Deceased  . Father  Alive  . Sister  Alive  . Brother  Alive  . Sister  Alive  . Annamarie Major  (Not Specified)  . Son  Alive  . Daughter  Alive    Review of Systems A complete 10 system ROS was obtained and was negative apart from what is mentioned.   Objective:   VITALS:   There were no vitals filed for this visit. Gen:  Appears stated age and in NAD. HEENT:  Normocephalic, atraumatic. The mucous membranes are moist. The superficial temporal arteries are without ropiness or tenderness. Cardiovascular: Regular rate and rhythm. Lungs: Clear to  auscultation bilaterally. Neck: There are no carotid bruits noted bilaterally.  NEUROLOGICAL:  Orientation:  The patient is alert and oriented x 3.  Recent and remote memory are intact.  Attention span and concentration are normal.  Able to name objects and repeat without trouble.  Fund of knowledge is appropriate Cranial nerves: There is good facial  symmetry. The pupils are equal round and reactive to light bilaterally. Fundoscopic exam reveals clear disc margins bilaterally. Extraocular muscles are intact and visual fields are full to confrontational testing. Speech is fluent and clear. Soft palate rises symmetrically and there is no tongue deviation. Hearing is intact to conversational tone. Tone: Tone is good throughout. Sensation: Sensation is intact to light touch and pinprick throughout (facial, trunk, extremities). Vibration is decreased at the bilateral big toe but intact at the ankle. There is no extinction with double simultaneous stimulation. There is no sensory dermatomal level identified. Coordination:  The patient has no dysdiadichokinesia or dysmetria. Motor: Strength is 5/5 in the bilateral upper and lower extremities.  Shoulder shrug is equal bilaterally.  There is no pronator drift.  There are no fasciculations noted. DTR's: Deep tendon reflexes are 2/4 at the bilateral biceps, triceps, brachioradialis, 2+-3/4 at the bilateral patella (with pre-patellar reflexes) and 1/4 at the bilateral achilles.  Plantar responses are downgoing bilaterally. Gait and Station: The patient is able to ambulate without difficulty. The patient is able to heel toe walk without any difficulty. The patient has mild trouble ambulating in a tandem fashion. The patient is able to stand in the Romberg position.   MOVEMENT EXAM: Tremor:  There is tremor in the UE, noted most significantly with action.  The right is just slightly worse than the left.  It is mild.  The patient is able to draw Archimedes spirals  without significant difficulty but tremor is evident bilaterally.  There is no tremor at rest.  The patient is  able to pour water from one glass to another without spilling it but tremor is noted.  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  We discussed that this can continue to gradually get worse with time.  We discussed that some medications can worsen this, as can caffeine use.  We discussed medication therapy as well as surgical therapy.  Ultimately, the patient decided to try primidone, 50 mg daily.  Risks, benefits, side effects and alternative therapies were discussed.  The opportunity to ask questions was given and they were answered to the best of my ability.  The patient expressed understanding and willingness to follow the outlined treatment protocols.  -he needs labs including TSH, chemistry  -discussed [facility] position on tremor and marijuana and did not recommend.      2.  Much greater than 50% of this visit was spent in counseling and coordinating care.  Total face to face time:  45 min.  Mod complexity visit.

## 2017-11-25 ENCOUNTER — Ambulatory Visit: Payer: Medicare Other | Admitting: Neurology

## 2017-11-26 DIAGNOSIS — B349 Viral infection, unspecified: Secondary | ICD-10-CM | POA: Diagnosis not present

## 2017-11-26 DIAGNOSIS — R5381 Other malaise: Secondary | ICD-10-CM | POA: Diagnosis not present

## 2017-11-29 DIAGNOSIS — R05 Cough: Secondary | ICD-10-CM | POA: Diagnosis not present

## 2017-12-03 DIAGNOSIS — R05 Cough: Secondary | ICD-10-CM | POA: Diagnosis not present

## 2017-12-03 DIAGNOSIS — J069 Acute upper respiratory infection, unspecified: Secondary | ICD-10-CM | POA: Diagnosis not present

## 2017-12-03 DIAGNOSIS — I10 Essential (primary) hypertension: Secondary | ICD-10-CM | POA: Diagnosis not present

## 2017-12-03 DIAGNOSIS — R062 Wheezing: Secondary | ICD-10-CM | POA: Diagnosis not present

## 2017-12-15 ENCOUNTER — Other Ambulatory Visit: Payer: Self-pay | Admitting: Neurology

## 2017-12-15 NOTE — Progress Notes (Signed)
Subjective:   Travis Reese was seen in consultation in the movement disorder clinic at the request of Ronita Hipps, MD.  The evaluation is for tremor.  Tremor started approximately a few months ago and is intermittent involves the bilateral UE.  Tremor is most noticeable when he is doing something like putting toothpaste on the toothbrush.  He does state that he has been weaning the klonopin for the last month from tid to qhs, about 3 days per week.   Uses melatonin on the other nights.  There is a family hx of tremor in his father, who was dx with PD at the age of 28.    Affected by caffeine:  No. (2, 16-oz coffee per day) Affected by alcohol:  Doesn't drink EtOH Affected by stress:  May or may not - "feels tense inside" Affected by fatigue:  No. Spills soup if on spoon:  No. Spills glass of liquid if full:  No. Affects ADL's (tying shoes, brushing teeth, etc):  No.   12/16/17 update: Patient is seen today in follow-up.  The patient was started on primidone last visit.  Patient reports that tremor is about 90% better but not completely gone.  He reports that he is now on seroquel for sleep.  He doesn't know the dosage but thinks that it may be 25 mg.  He did stop the zanaflex he was using for sleep; prior to doing that he was too deep in sleep and had incontinence, urinary.  better now that he d/c.  He did fall and broke a bone in his hand.  States that he was in the parking lot and there was a plant stand that fell onto of him when he passed it and it caught his shoe and he hit his face.  He does note some depression and trouble with motivation.  He notes some trouble going through the doors.  He will lose balance and bump into the door frame.  Current/Previously tried tremor medications: on klonopin but worked down from 1 mg tid to prn (about 1 time per week now per patient)  Current medications that may exacerbate tremor:  albuterol, singulair (using something about 2 times per day -  doesn't think it changes tremor)  Outside reports reviewed: lab reports, office notes and referral letter/letters.  No Known Allergies  Outpatient Encounter Medications as of 12/16/2017  Medication Sig  . albuterol (PROVENTIL HFA;VENTOLIN HFA) 108 (90 BASE) MCG/ACT inhaler Inhale 2 puffs into the lungs every 6 (six) hours as needed for wheezing or shortness of breath.  . Ascorbic Acid (VITAMIN C) 1000 MG tablet Take 1,000 mg by mouth daily.  Marland Kitchen BIOTIN PO Take daily by mouth.  Marland Kitchen buPROPion (WELLBUTRIN XL) 150 MG 24 hr tablet Take 150 mg daily by mouth.  Marland Kitchen CALCIUM PO Take by mouth.  . clonazePAM (KLONOPIN) 1 MG tablet Take 1 mg by mouth 3 (three) times daily as needed for anxiety.  . DULoxetine (CYMBALTA) 60 MG capsule Take 60 mg daily by mouth.  Marland Kitchen glucosamine-chondroitin 500-400 MG tablet Take 1 tablet 3 (three) times daily by mouth.  Marland Kitchen lisinopril (PRINIVIL,ZESTRIL) 30 MG tablet Take 30 mg daily by mouth.  . MELATONIN PO Take by mouth.  . montelukast (SINGULAIR) 10 MG tablet Take 10 mg daily by mouth.  . naproxen (NAPROSYN) 500 MG tablet Take 500 mg by mouth 2 (two) times daily with a meal.  . Omega-3 Fatty Acids (FISH OIL) 1200 MG CAPS Take 1 capsule by mouth  daily.  . polyethylene glycol powder (GLYCOLAX/MIRALAX) powder Take 17 g by mouth as needed.   . primidone (MYSOLINE) 50 MG tablet TAKE 1 TABLET BY MOUTH AT  BEDTIME  . tamsulosin (FLOMAX) 0.4 MG CAPS capsule Take 0.4 mg daily by mouth.  . terbinafine (LAMISIL) 250 MG tablet Take 250 mg daily by mouth.  Marland Kitchen tiZANidine (ZANAFLEX) 4 MG tablet Take 8 mg at bedtime by mouth.  . TURMERIC PO Take by mouth.   No facility-administered encounter medications on file as of 12/16/2017.     Past Medical History:  Diagnosis Date  . Anxiety   . Arthritis    knees, neck, shoulder  . Elevated PSA    denies per patient  . Hyperplasia of prostate with lower urinary tract symptoms (LUTS)   . Hypertension   . Prolapsed internal hemorrhoids, grade 3  04/19/2015  . Wears hearing aid    bilateral    Past Surgical History:  Procedure Laterality Date  . COLONOSCOPY  2007  . NASAL SINUS SURGERY  2006  . TRANSANAL HEMORRHOIDAL DEARTERIALIZATION N/A 11/15/2015   Procedure: TRANSANAL HEMORRHOIDAL DEARTERIALIZATION;  Surgeon: Leighton Ruff, MD;  Location: [facility];  Service: General;  Laterality: N/A;    Social History   Socioeconomic History  . Marital status: Unknown    Spouse name: Not on file  . Number of children: 2  . Years of education: Not on file  . Highest education level: Not on file  Occupational History  . Occupation: retired    Comment: Presenter, broadcasting  Social Needs  . Financial resource strain: Not on file  . Food insecurity:    Worry: Not on file    Inability: Not on file  . Transportation needs:    Medical: Not on file    Non-medical: Not on file  Tobacco Use  . Smoking status: Current Every Day Smoker    Packs/day: 0.50    Years: 50.00    Pack years: 25.00    Types: Cigarettes  . Smokeless tobacco: Never Used  Substance and Sexual Activity  . Alcohol use: No    Alcohol/week: 0.0 oz  . Drug use: No  . Sexual activity: Not on file  Lifestyle  . Physical activity:    Days per week: Not on file    Minutes per session: Not on file  . Stress: Not on file  Relationships  . Social connections:    Talks on phone: Not on file    Gets together: Not on file    Attends religious service: Not on file    Active member of club or organization: Not on file    Attends meetings of clubs or organizations: Not on file    Relationship status: Not on file  . Intimate partner violence:    Fear of current or ex partner: Not on file    Emotionally abused: Not on file    Physically abused: Not on file    Forced sexual activity: Not on file  Other Topics Concern  . Not on file  Social History Narrative   Divorced lives with father after incarceration x 5 yrs   1 son, 1 daughter   Volunteer work   3  caffeine/day   04/19/2015    Family Status  Relation Name Status  . Mother  Deceased  . Father  Alive  . Sister  Alive  . Brother  Alive  . Sister  Alive  . Annamarie Major  (Not Specified)  .  Son  Alive  . Daughter  Alive    Review of Systems A complete 10 system ROS was obtained and was negative apart from what is mentioned.   Objective:   VITALS:   Vitals:   12/16/17 1453  BP: 138/80  Pulse: 94  SpO2: 95%  Weight: 222 lb (100.7 kg)  Height: 6\' 2"  (1.88 m)   Gen:  Appears stated age and in NAD. HEENT:  Normocephalic, atraumatic. The mucous membranes are moist. The superficial temporal arteries are without ropiness or tenderness. Cardiovascular: Regular rate and rhythm. Lungs: Clear to auscultation bilaterally. Neck: There are no carotid bruits noted bilaterally.  NEUROLOGICAL:  Orientation:  The patient is alert and oriented x 3.  Recent and remote memory are intact.  Attention span and concentration are normal.  Able to name objects and repeat without trouble.  Fund of knowledge is appropriate Cranial nerves: There is good facial symmetry.  Extraocular muscles are intact and visual fields are full to confrontational testing. Speech is fluent and clear. Soft palate rises symmetrically and there is no tongue deviation. Hearing is intact to conversational tone. Tone: Tone is good throughout. Sensation: Sensation is intact to light touch  Coordination:  The patient has no dysdiadichokinesia.  He has mild trouble with finger to nose on the right with eyes closed but does well with eyes open Motor: Strength is 5/5 in the bilateral upper and lower extremities.  Shoulder shrug is equal bilaterally.  There is no pronator drift.  There are no fasciculations noted. Gait and Station: The patient is able to ambulate without difficulty.  MOVEMENT EXAM: Tremor:  There is very mild tremor of the outstretched hands, right more than left.  Archimedes spirals are improved.  Labs:  Lab  Results  Component Value Date   TSH 1.53 07/21/2017     Chemistry      Component Value Date/Time   NA 136 07/21/2017 1401   K 4.1 07/21/2017 1401   CL 100 07/21/2017 1401   CO2 25 07/21/2017 1401   BUN 13 07/21/2017 1401   CREATININE 0.77 07/21/2017 1401      Component Value Date/Time   CALCIUM 9.3 07/21/2017 1401   AST 18 07/21/2017 1401   ALT 16 07/21/2017 1401   BILITOT 0.5 07/21/2017 1401          Assessment/Plan:   1.  Essential Tremor.  -Improved with primidone, 50 mg daily.  He asked me about increasing it.  He, however, reports that he is 90% better and I did not recommend increase today given that other medicines may be changed later in the week.  2.  Depression  -may need to consider increasing wellbutrin XL, 300 mg.  Has an appt on thurs with providing prescriber and he will discuss with her.  He is also on Seroquel, 25 mg at night, and this dosage could be altered as well.  I will leave this up to prescribing provider.  3.  F/u in 6 months

## 2017-12-16 ENCOUNTER — Ambulatory Visit (INDEPENDENT_AMBULATORY_CARE_PROVIDER_SITE_OTHER): Payer: Medicare Other | Admitting: Neurology

## 2017-12-16 ENCOUNTER — Encounter: Payer: Self-pay | Admitting: Neurology

## 2017-12-16 VITALS — BP 138/80 | HR 94 | Ht 74.0 in | Wt 222.0 lb

## 2017-12-16 DIAGNOSIS — F330 Major depressive disorder, recurrent, mild: Secondary | ICD-10-CM | POA: Diagnosis not present

## 2017-12-16 DIAGNOSIS — G250 Essential tremor: Secondary | ICD-10-CM

## 2017-12-16 NOTE — Patient Instructions (Signed)
You look great.  Talk to [place], PA-C about potentially increasing your wellbutrin or even seroquel for mood

## 2017-12-18 ENCOUNTER — Ambulatory Visit: Payer: Medicare Other | Admitting: Neurology

## 2017-12-23 DIAGNOSIS — E559 Vitamin D deficiency, unspecified: Secondary | ICD-10-CM | POA: Diagnosis not present

## 2017-12-23 DIAGNOSIS — Z1331 Encounter for screening for depression: Secondary | ICD-10-CM | POA: Diagnosis not present

## 2017-12-23 DIAGNOSIS — Z79899 Other long term (current) drug therapy: Secondary | ICD-10-CM | POA: Diagnosis not present

## 2017-12-23 DIAGNOSIS — E785 Hyperlipidemia, unspecified: Secondary | ICD-10-CM | POA: Diagnosis not present

## 2017-12-23 DIAGNOSIS — K219 Gastro-esophageal reflux disease without esophagitis: Secondary | ICD-10-CM | POA: Diagnosis not present

## 2018-01-08 DIAGNOSIS — R29898 Other symptoms and signs involving the musculoskeletal system: Secondary | ICD-10-CM | POA: Diagnosis not present

## 2018-01-08 DIAGNOSIS — R251 Tremor, unspecified: Secondary | ICD-10-CM | POA: Diagnosis not present

## 2018-01-08 DIAGNOSIS — R5383 Other fatigue: Secondary | ICD-10-CM | POA: Diagnosis not present

## 2018-01-21 ENCOUNTER — Telehealth: Payer: Self-pay | Admitting: Neurology

## 2018-01-21 NOTE — Telephone Encounter (Signed)
Pt left a VM message saying he was having some severe problems since doubling up on his welbutrin and wants a call back

## 2018-01-21 NOTE — Telephone Encounter (Signed)
I called patient back and he said that he is having a lot of weakness in his legs since doubling up on his Wellbutrin.  He said that he may just take one tonight and see how he does but would like advice from Dr. Carles Collet as well.  Please advise.

## 2018-01-22 NOTE — Telephone Encounter (Signed)
See note.  I didn't double it and I don't prescribe it.  I did recommend he talk with prescribing provider about it to see if it does need increased.  He should call prescribing provider

## 2018-01-23 NOTE — Telephone Encounter (Signed)
Patient instructed to call prescribing provider.

## 2018-01-29 DIAGNOSIS — N401 Enlarged prostate with lower urinary tract symptoms: Secondary | ICD-10-CM | POA: Diagnosis not present

## 2018-02-24 DIAGNOSIS — L720 Epidermal cyst: Secondary | ICD-10-CM | POA: Diagnosis not present

## 2018-03-09 ENCOUNTER — Other Ambulatory Visit: Payer: Self-pay | Admitting: Neurology

## 2018-03-09 DIAGNOSIS — H2513 Age-related nuclear cataract, bilateral: Secondary | ICD-10-CM | POA: Diagnosis not present

## 2018-03-09 DIAGNOSIS — H524 Presbyopia: Secondary | ICD-10-CM | POA: Diagnosis not present

## 2018-03-24 DIAGNOSIS — E559 Vitamin D deficiency, unspecified: Secondary | ICD-10-CM | POA: Diagnosis not present

## 2018-03-24 DIAGNOSIS — Z79899 Other long term (current) drug therapy: Secondary | ICD-10-CM | POA: Diagnosis not present

## 2018-03-24 DIAGNOSIS — R5383 Other fatigue: Secondary | ICD-10-CM | POA: Diagnosis not present

## 2018-03-24 DIAGNOSIS — M545 Low back pain: Secondary | ICD-10-CM | POA: Diagnosis not present

## 2018-03-24 DIAGNOSIS — E785 Hyperlipidemia, unspecified: Secondary | ICD-10-CM | POA: Diagnosis not present

## 2018-03-26 DIAGNOSIS — M4156 Other secondary scoliosis, lumbar region: Secondary | ICD-10-CM | POA: Diagnosis not present

## 2018-03-26 DIAGNOSIS — M7061 Trochanteric bursitis, right hip: Secondary | ICD-10-CM | POA: Diagnosis not present

## 2018-04-02 DIAGNOSIS — R05 Cough: Secondary | ICD-10-CM | POA: Diagnosis not present

## 2018-04-02 DIAGNOSIS — J309 Allergic rhinitis, unspecified: Secondary | ICD-10-CM | POA: Diagnosis not present

## 2018-04-02 HISTORY — PX: CATARACT EXTRACTION: SUR2

## 2018-04-09 DIAGNOSIS — H2511 Age-related nuclear cataract, right eye: Secondary | ICD-10-CM | POA: Diagnosis not present

## 2018-04-09 DIAGNOSIS — H25811 Combined forms of age-related cataract, right eye: Secondary | ICD-10-CM | POA: Diagnosis not present

## 2018-04-12 DIAGNOSIS — J810 Acute pulmonary edema: Secondary | ICD-10-CM | POA: Diagnosis not present

## 2018-04-12 DIAGNOSIS — J22 Unspecified acute lower respiratory infection: Secondary | ICD-10-CM

## 2018-04-12 DIAGNOSIS — I10 Essential (primary) hypertension: Secondary | ICD-10-CM

## 2018-04-12 DIAGNOSIS — R0602 Shortness of breath: Secondary | ICD-10-CM | POA: Diagnosis not present

## 2018-04-12 DIAGNOSIS — I5031 Acute diastolic (congestive) heart failure: Secondary | ICD-10-CM | POA: Diagnosis not present

## 2018-04-12 DIAGNOSIS — R0902 Hypoxemia: Secondary | ICD-10-CM

## 2018-04-12 DIAGNOSIS — R0789 Other chest pain: Secondary | ICD-10-CM | POA: Diagnosis not present

## 2018-04-12 DIAGNOSIS — J90 Pleural effusion, not elsewhere classified: Secondary | ICD-10-CM | POA: Diagnosis not present

## 2018-04-12 DIAGNOSIS — R079 Chest pain, unspecified: Secondary | ICD-10-CM | POA: Diagnosis not present

## 2018-04-12 DIAGNOSIS — M159 Polyosteoarthritis, unspecified: Secondary | ICD-10-CM

## 2018-04-12 DIAGNOSIS — K219 Gastro-esophageal reflux disease without esophagitis: Secondary | ICD-10-CM

## 2018-04-12 DIAGNOSIS — I110 Hypertensive heart disease with heart failure: Secondary | ICD-10-CM | POA: Diagnosis not present

## 2018-04-12 DIAGNOSIS — I509 Heart failure, unspecified: Secondary | ICD-10-CM | POA: Diagnosis not present

## 2018-04-13 DIAGNOSIS — I509 Heart failure, unspecified: Secondary | ICD-10-CM | POA: Diagnosis not present

## 2018-04-13 DIAGNOSIS — R0602 Shortness of breath: Secondary | ICD-10-CM | POA: Diagnosis not present

## 2018-04-13 DIAGNOSIS — J90 Pleural effusion, not elsewhere classified: Secondary | ICD-10-CM | POA: Diagnosis not present

## 2018-04-13 DIAGNOSIS — R079 Chest pain, unspecified: Secondary | ICD-10-CM | POA: Diagnosis not present

## 2018-04-13 DIAGNOSIS — I5031 Acute diastolic (congestive) heart failure: Secondary | ICD-10-CM | POA: Diagnosis not present

## 2018-04-13 DIAGNOSIS — J810 Acute pulmonary edema: Secondary | ICD-10-CM | POA: Diagnosis not present

## 2018-04-14 DIAGNOSIS — J90 Pleural effusion, not elsewhere classified: Secondary | ICD-10-CM | POA: Diagnosis not present

## 2018-04-14 DIAGNOSIS — I5031 Acute diastolic (congestive) heart failure: Secondary | ICD-10-CM | POA: Diagnosis not present

## 2018-04-14 DIAGNOSIS — R079 Chest pain, unspecified: Secondary | ICD-10-CM | POA: Diagnosis not present

## 2018-04-14 DIAGNOSIS — J810 Acute pulmonary edema: Secondary | ICD-10-CM | POA: Diagnosis not present

## 2018-04-14 DIAGNOSIS — I509 Heart failure, unspecified: Secondary | ICD-10-CM | POA: Diagnosis not present

## 2018-04-16 DIAGNOSIS — Z79899 Other long term (current) drug therapy: Secondary | ICD-10-CM | POA: Diagnosis not present

## 2018-04-16 DIAGNOSIS — I77810 Thoracic aortic ectasia: Secondary | ICD-10-CM | POA: Diagnosis not present

## 2018-04-16 DIAGNOSIS — I358 Other nonrheumatic aortic valve disorders: Secondary | ICD-10-CM | POA: Diagnosis not present

## 2018-04-16 DIAGNOSIS — I10 Essential (primary) hypertension: Secondary | ICD-10-CM | POA: Diagnosis not present

## 2018-04-19 ENCOUNTER — Encounter (HOSPITAL_COMMUNITY): Payer: Self-pay | Admitting: Emergency Medicine

## 2018-04-19 ENCOUNTER — Other Ambulatory Visit: Payer: Self-pay

## 2018-04-19 ENCOUNTER — Emergency Department (HOSPITAL_COMMUNITY): Payer: Medicare Other

## 2018-04-19 ENCOUNTER — Inpatient Hospital Stay (HOSPITAL_COMMUNITY)
Admission: EM | Admit: 2018-04-19 | Discharge: 2018-05-08 | DRG: 216 | Disposition: A | Discharge status: Home / Self Care | Payer: Medicare Other | Attending: Thoracic Surgery (Cardiothoracic Vascular Surgery) | Admitting: Thoracic Surgery (Cardiothoracic Vascular Surgery)

## 2018-04-19 DIAGNOSIS — F17210 Nicotine dependence, cigarettes, uncomplicated: Secondary | ICD-10-CM | POA: Diagnosis present

## 2018-04-19 DIAGNOSIS — I4510 Unspecified right bundle-branch block: Secondary | ICD-10-CM | POA: Diagnosis not present

## 2018-04-19 DIAGNOSIS — E876 Hypokalemia: Secondary | ICD-10-CM | POA: Diagnosis not present

## 2018-04-19 DIAGNOSIS — E861 Hypovolemia: Secondary | ICD-10-CM | POA: Diagnosis present

## 2018-04-19 DIAGNOSIS — D62 Acute posthemorrhagic anemia: Secondary | ICD-10-CM | POA: Diagnosis not present

## 2018-04-19 DIAGNOSIS — I2510 Atherosclerotic heart disease of native coronary artery without angina pectoris: Secondary | ICD-10-CM | POA: Diagnosis present

## 2018-04-19 DIAGNOSIS — R0602 Shortness of breath: Secondary | ICD-10-CM | POA: Diagnosis present

## 2018-04-19 DIAGNOSIS — I9581 Postprocedural hypotension: Secondary | ICD-10-CM | POA: Diagnosis not present

## 2018-04-19 DIAGNOSIS — R197 Diarrhea, unspecified: Secondary | ICD-10-CM | POA: Diagnosis not present

## 2018-04-19 DIAGNOSIS — M19019 Primary osteoarthritis, unspecified shoulder: Secondary | ICD-10-CM | POA: Diagnosis present

## 2018-04-19 DIAGNOSIS — Y92230 Patient room in hospital as the place of occurrence of the external cause: Secondary | ICD-10-CM | POA: Diagnosis not present

## 2018-04-19 DIAGNOSIS — Z8371 Family history of colonic polyps: Secondary | ICD-10-CM

## 2018-04-19 DIAGNOSIS — I10 Essential (primary) hypertension: Secondary | ICD-10-CM

## 2018-04-19 DIAGNOSIS — I959 Hypotension, unspecified: Secondary | ICD-10-CM | POA: Diagnosis not present

## 2018-04-19 DIAGNOSIS — F419 Anxiety disorder, unspecified: Secondary | ICD-10-CM | POA: Diagnosis not present

## 2018-04-19 DIAGNOSIS — R011 Cardiac murmur, unspecified: Secondary | ICD-10-CM

## 2018-04-19 DIAGNOSIS — N401 Enlarged prostate with lower urinary tract symptoms: Secondary | ICD-10-CM | POA: Diagnosis not present

## 2018-04-19 DIAGNOSIS — Z79899 Other long term (current) drug therapy: Secondary | ICD-10-CM

## 2018-04-19 DIAGNOSIS — I5032 Chronic diastolic (congestive) heart failure: Secondary | ICD-10-CM

## 2018-04-19 DIAGNOSIS — I110 Hypertensive heart disease with heart failure: Principal | ICD-10-CM | POA: Diagnosis present

## 2018-04-19 DIAGNOSIS — E871 Hypo-osmolality and hyponatremia: Secondary | ICD-10-CM | POA: Diagnosis present

## 2018-04-19 DIAGNOSIS — I080 Rheumatic disorders of both mitral and aortic valves: Secondary | ICD-10-CM | POA: Diagnosis present

## 2018-04-19 DIAGNOSIS — Z952 Presence of prosthetic heart valve: Secondary | ICD-10-CM

## 2018-04-19 DIAGNOSIS — Z8042 Family history of malignant neoplasm of prostate: Secondary | ICD-10-CM

## 2018-04-19 DIAGNOSIS — J9601 Acute respiratory failure with hypoxia: Secondary | ICD-10-CM | POA: Diagnosis present

## 2018-04-19 DIAGNOSIS — Y831 Surgical operation with implant of artificial internal device as the cause of abnormal reaction of the patient, or of later complication, without mention of misadventure at the time of the procedure: Secondary | ICD-10-CM | POA: Diagnosis not present

## 2018-04-19 DIAGNOSIS — I97190 Other postprocedural cardiac functional disturbances following cardiac surgery: Secondary | ICD-10-CM | POA: Diagnosis not present

## 2018-04-19 DIAGNOSIS — I712 Thoracic aortic aneurysm, without rupture: Secondary | ICD-10-CM | POA: Diagnosis present

## 2018-04-19 DIAGNOSIS — D696 Thrombocytopenia, unspecified: Secondary | ICD-10-CM | POA: Diagnosis not present

## 2018-04-19 DIAGNOSIS — R062 Wheezing: Secondary | ICD-10-CM | POA: Diagnosis not present

## 2018-04-19 DIAGNOSIS — I4891 Unspecified atrial fibrillation: Secondary | ICD-10-CM | POA: Diagnosis not present

## 2018-04-19 DIAGNOSIS — J9811 Atelectasis: Secondary | ICD-10-CM | POA: Diagnosis not present

## 2018-04-19 DIAGNOSIS — R918 Other nonspecific abnormal finding of lung field: Secondary | ICD-10-CM | POA: Diagnosis not present

## 2018-04-19 DIAGNOSIS — I351 Nonrheumatic aortic (valve) insufficiency: Secondary | ICD-10-CM

## 2018-04-19 DIAGNOSIS — I350 Nonrheumatic aortic (valve) stenosis: Secondary | ICD-10-CM | POA: Diagnosis not present

## 2018-04-19 DIAGNOSIS — R059 Cough, unspecified: Secondary | ICD-10-CM

## 2018-04-19 DIAGNOSIS — R0600 Dyspnea, unspecified: Secondary | ICD-10-CM

## 2018-04-19 DIAGNOSIS — M170 Bilateral primary osteoarthritis of knee: Secondary | ICD-10-CM | POA: Diagnosis present

## 2018-04-19 DIAGNOSIS — E875 Hyperkalemia: Secondary | ICD-10-CM | POA: Diagnosis not present

## 2018-04-19 DIAGNOSIS — I6523 Occlusion and stenosis of bilateral carotid arteries: Secondary | ICD-10-CM | POA: Diagnosis present

## 2018-04-19 DIAGNOSIS — Z01810 Encounter for preprocedural cardiovascular examination: Secondary | ICD-10-CM | POA: Diagnosis not present

## 2018-04-19 DIAGNOSIS — F418 Other specified anxiety disorders: Secondary | ICD-10-CM | POA: Diagnosis present

## 2018-04-19 DIAGNOSIS — M4692 Unspecified inflammatory spondylopathy, cervical region: Secondary | ICD-10-CM | POA: Diagnosis present

## 2018-04-19 DIAGNOSIS — I440 Atrioventricular block, first degree: Secondary | ICD-10-CM | POA: Diagnosis not present

## 2018-04-19 DIAGNOSIS — Z820 Family history of epilepsy and other diseases of the nervous system: Secondary | ICD-10-CM

## 2018-04-19 DIAGNOSIS — I4581 Long QT syndrome: Secondary | ICD-10-CM | POA: Diagnosis present

## 2018-04-19 DIAGNOSIS — R069 Unspecified abnormalities of breathing: Secondary | ICD-10-CM | POA: Diagnosis not present

## 2018-04-19 DIAGNOSIS — J439 Emphysema, unspecified: Secondary | ICD-10-CM | POA: Diagnosis present

## 2018-04-19 DIAGNOSIS — I358 Other nonrheumatic aortic valve disorders: Secondary | ICD-10-CM | POA: Diagnosis not present

## 2018-04-19 DIAGNOSIS — J90 Pleural effusion, not elsewhere classified: Secondary | ICD-10-CM | POA: Diagnosis not present

## 2018-04-19 DIAGNOSIS — N400 Enlarged prostate without lower urinary tract symptoms: Secondary | ICD-10-CM | POA: Diagnosis present

## 2018-04-19 DIAGNOSIS — I509 Heart failure, unspecified: Secondary | ICD-10-CM | POA: Diagnosis not present

## 2018-04-19 DIAGNOSIS — K0889 Other specified disorders of teeth and supporting structures: Secondary | ICD-10-CM | POA: Diagnosis present

## 2018-04-19 DIAGNOSIS — Z8249 Family history of ischemic heart disease and other diseases of the circulatory system: Secondary | ICD-10-CM

## 2018-04-19 DIAGNOSIS — Z825 Family history of asthma and other chronic lower respiratory diseases: Secondary | ICD-10-CM

## 2018-04-19 DIAGNOSIS — Z953 Presence of xenogenic heart valve: Secondary | ICD-10-CM

## 2018-04-19 DIAGNOSIS — I5033 Acute on chronic diastolic (congestive) heart failure: Secondary | ICD-10-CM | POA: Diagnosis present

## 2018-04-19 DIAGNOSIS — Z803 Family history of malignant neoplasm of breast: Secondary | ICD-10-CM

## 2018-04-19 DIAGNOSIS — R05 Cough: Secondary | ICD-10-CM

## 2018-04-19 HISTORY — DX: Benign prostatic hyperplasia without lower urinary tract symptoms: N40.0

## 2018-04-19 HISTORY — DX: Essential (primary) hypertension: I10

## 2018-04-19 HISTORY — DX: Heart failure, unspecified: I50.9

## 2018-04-19 HISTORY — DX: Chronic diastolic (congestive) heart failure: I50.32

## 2018-04-19 LAB — CBC WITH DIFFERENTIAL/PLATELET
Abs Immature Granulocytes: 0.1 10*3/uL (ref 0.0–0.1)
Basophils Absolute: 0 10*3/uL (ref 0.0–0.1)
Basophils Relative: 0 %
Eosinophils Absolute: 0 10*3/uL (ref 0.0–0.7)
Eosinophils Relative: 0 %
HCT: 40.7 % (ref 39.0–52.0)
Hemoglobin: 14.2 g/dL (ref 13.0–17.0)
Immature Granulocytes: 1 %
Lymphocytes Relative: 8 %
Lymphs Abs: 0.7 10*3/uL (ref 0.7–4.0)
MCH: 35 pg — ABNORMAL HIGH (ref 26.0–34.0)
MCHC: 34.9 g/dL (ref 30.0–36.0)
MCV: 100.2 fL — ABNORMAL HIGH (ref 78.0–100.0)
Monocytes Absolute: 0.4 10*3/uL (ref 0.1–1.0)
Monocytes Relative: 4 %
Neutro Abs: 7.1 10*3/uL (ref 1.7–7.7)
Neutrophils Relative %: 87 %
Platelets: 187 10*3/uL (ref 150–400)
RBC: 4.06 MIL/uL — ABNORMAL LOW (ref 4.22–5.81)
RDW: 12.4 % (ref 11.5–15.5)
WBC: 8.2 10*3/uL (ref 4.0–10.5)

## 2018-04-19 LAB — URINALYSIS, ROUTINE W REFLEX MICROSCOPIC
Bilirubin Urine: NEGATIVE
Glucose, UA: NEGATIVE mg/dL
Hgb urine dipstick: NEGATIVE
Ketones, ur: NEGATIVE mg/dL
Leukocytes, UA: NEGATIVE
Nitrite: NEGATIVE
Protein, ur: NEGATIVE mg/dL
Specific Gravity, Urine: 1.004 — ABNORMAL LOW (ref 1.005–1.030)
pH: 7 (ref 5.0–8.0)

## 2018-04-19 LAB — COMPREHENSIVE METABOLIC PANEL
ALT: 22 U/L (ref 0–44)
AST: 26 U/L (ref 15–41)
Albumin: 3.4 g/dL — ABNORMAL LOW (ref 3.5–5.0)
Alkaline Phosphatase: 61 U/L (ref 38–126)
Anion gap: 12 (ref 5–15)
BUN: 17 mg/dL (ref 8–23)
CO2: 23 mmol/L (ref 22–32)
Calcium: 8.9 mg/dL (ref 8.9–10.3)
Chloride: 93 mmol/L — ABNORMAL LOW (ref 98–111)
Creatinine, Ser: 0.81 mg/dL (ref 0.61–1.24)
GFR calc Af Amer: >60 mL/min (ref >60)
GFR calc non Af Amer: >60 mL/min (ref >60)
Glucose, Bld: 122 mg/dL — ABNORMAL HIGH (ref 70–99)
Potassium: 3.9 mmol/L (ref 3.5–5.1)
Sodium: 128 mmol/L — ABNORMAL LOW (ref 135–145)
Total Bilirubin: 1.3 mg/dL — ABNORMAL HIGH (ref 0.3–1.2)
Total Protein: 6.5 g/dL (ref 6.5–8.1)

## 2018-04-19 LAB — I-STAT TROPONIN, ED: Troponin i, poc: 0.02 ng/mL (ref 0.00–0.08)

## 2018-04-19 LAB — BRAIN NATRIURETIC PEPTIDE: B Natriuretic Peptide: 1549.5 pg/mL — ABNORMAL HIGH (ref 0.0–100.0)

## 2018-04-19 MED ORDER — POTASSIUM CHLORIDE CRYS ER 20 MEQ PO TBCR
20.0000 meq | EXTENDED_RELEASE_TABLET | Freq: Two times a day (BID) | ORAL | Status: DC
  Administered 2018-04-19 – 2018-04-22 (×7): 20 meq via ORAL
  Filled 2018-04-19 (×8): qty 1

## 2018-04-19 MED ORDER — FUROSEMIDE 10 MG/ML IJ SOLN: 80.0000 mg | Freq: Once | INTRAMUSCULAR | Status: DC

## 2018-04-19 MED ORDER — LOSARTAN POTASSIUM 50 MG PO TABS
50.0000 mg | ORAL_TABLET | Freq: Every day | ORAL | Status: DC
  Administered 2018-04-20 – 2018-04-30 (×11): 50 mg via ORAL
  Filled 2018-04-19 (×12): qty 1

## 2018-04-19 MED ORDER — CLONAZEPAM 0.5 MG PO TABS
1.0000 mg | ORAL_TABLET | Freq: Three times a day (TID) | ORAL | Status: DC | PRN
  Administered 2018-04-21: 1 mg via ORAL
  Filled 2018-04-19: qty 2

## 2018-04-19 MED ORDER — FUROSEMIDE 10 MG/ML IJ SOLN
40.0000 mg | Freq: Two times a day (BID) | INTRAMUSCULAR | Status: DC
  Administered 2018-04-20 – 2018-04-21 (×3): 40 mg via INTRAVENOUS
  Filled 2018-04-19 (×3): qty 4

## 2018-04-19 MED ORDER — VITAMIN C 500 MG PO TABS
1000.0000 mg | ORAL_TABLET | Freq: Every day | ORAL | Status: DC
  Administered 2018-04-20 – 2018-04-30 (×11): 1000 mg via ORAL
  Filled 2018-04-19 (×11): qty 2

## 2018-04-19 MED ORDER — ONDANSETRON HCL 4 MG/2ML IJ SOLN: 4.0000 mg | Freq: Four times a day (QID) | INTRAMUSCULAR | Status: DC | PRN

## 2018-04-19 MED ORDER — QUETIAPINE FUMARATE 25 MG PO TABS
25.0000 mg | ORAL_TABLET | Freq: Every day | ORAL | Status: DC
  Administered 2018-04-19 – 2018-04-24 (×6): 25 mg via ORAL
  Administered 2018-04-25 – 2018-04-26 (×2): 12.5 mg via ORAL
  Administered 2018-04-27 – 2018-04-28 (×2): 25 mg via ORAL
  Administered 2018-04-29 – 2018-04-30 (×2): 12.5 mg via ORAL
  Filled 2018-04-19 (×12): qty 1

## 2018-04-19 MED ORDER — ALBUTEROL SULFATE (2.5 MG/3ML) 0.083% IN NEBU
5.0000 mg | INHALATION_SOLUTION | Freq: Once | RESPIRATORY_TRACT | Status: AC
  Administered 2018-04-19: 5 mg via RESPIRATORY_TRACT
  Filled 2018-04-19: qty 6

## 2018-04-19 MED ORDER — SODIUM CHLORIDE 0.9% FLUSH
3.0000 mL | Freq: Two times a day (BID) | INTRAVENOUS | Status: DC
  Administered 2018-04-19 – 2018-04-26 (×15): 3 mL via INTRAVENOUS

## 2018-04-19 MED ORDER — ENOXAPARIN SODIUM 40 MG/0.4ML ~~LOC~~ SOLN
40.0000 mg | SUBCUTANEOUS | Status: DC
  Administered 2018-04-20 – 2018-04-26 (×7): 40 mg via SUBCUTANEOUS
  Filled 2018-04-19 (×7): qty 0.4

## 2018-04-19 MED ORDER — FUROSEMIDE 10 MG/ML IJ SOLN
20.0000 mg | Freq: Once | INTRAMUSCULAR | Status: AC
  Administered 2018-04-19: 20 mg via INTRAVENOUS
  Filled 2018-04-19: qty 2

## 2018-04-19 MED ORDER — CARVEDILOL 3.125 MG PO TABS
3.1250 mg | ORAL_TABLET | Freq: Two times a day (BID) | ORAL | Status: DC
  Administered 2018-04-19 – 2018-04-23 (×9): 3.125 mg via ORAL
  Filled 2018-04-19 (×9): qty 1

## 2018-04-19 MED ORDER — FUROSEMIDE 10 MG/ML IJ SOLN
80.0000 mg | Freq: Once | INTRAMUSCULAR | Status: AC
  Administered 2018-04-19: 80 mg via INTRAVENOUS
  Filled 2018-04-19: qty 8

## 2018-04-19 MED ORDER — TIZANIDINE HCL 4 MG PO TABS
4.0000 mg | ORAL_TABLET | Freq: Every day | ORAL | Status: DC
  Administered 2018-04-19 – 2018-04-30 (×12): 4 mg via ORAL
  Filled 2018-04-19 (×12): qty 1

## 2018-04-19 MED ORDER — DULOXETINE HCL 60 MG PO CPEP
60.0000 mg | ORAL_CAPSULE | Freq: Every day | ORAL | Status: DC
  Administered 2018-04-20 – 2018-04-30 (×11): 60 mg via ORAL
  Filled 2018-04-19 (×12): qty 1

## 2018-04-19 MED ORDER — TAMSULOSIN HCL 0.4 MG PO CAPS
0.4000 mg | ORAL_CAPSULE | Freq: Every day | ORAL | Status: DC
  Administered 2018-04-19 – 2018-04-30 (×12): 0.4 mg via ORAL
  Filled 2018-04-19 (×12): qty 1

## 2018-04-19 MED ORDER — PRIMIDONE 50 MG PO TABS
50.0000 mg | ORAL_TABLET | Freq: Every day | ORAL | Status: DC
  Administered 2018-04-19 – 2018-04-30 (×12): 50 mg via ORAL
  Filled 2018-04-19 (×13): qty 1

## 2018-04-19 MED ORDER — BUPROPION HCL ER (XL) 150 MG PO TB24
150.0000 mg | ORAL_TABLET | Freq: Every day | ORAL | Status: DC
  Administered 2018-04-19 – 2018-04-30 (×11): 150 mg via ORAL
  Filled 2018-04-19 (×15): qty 1

## 2018-04-19 MED ORDER — COENZYME Q10 30 MG PO CAPS: 30.0000 mg | ORAL_CAPSULE | Freq: Two times a day (BID) | ORAL | Status: DC

## 2018-04-19 MED ORDER — SODIUM CHLORIDE 0.9% FLUSH
3.0000 mL | INTRAVENOUS | Status: DC | PRN
  Administered 2018-04-27 – 2018-04-28 (×2): 3 mL via INTRAVENOUS
  Filled 2018-04-19 (×2): qty 3

## 2018-04-19 MED ORDER — SODIUM CHLORIDE 0.9 % IV SOLN: 250.0000 mL | INTRAVENOUS | Status: DC | PRN

## 2018-04-19 MED ORDER — ACETAMINOPHEN 325 MG PO TABS
650.0000 mg | ORAL_TABLET | ORAL | Status: DC | PRN
Start: 2018-04-19 — End: 2018-05-01
  Administered 2018-04-21: 650 mg via ORAL
  Filled 2018-04-19: qty 2

## 2018-04-19 NOTE — Consult Note (Addendum)
Reason for Consult:   SOB  Requesting Physician: Dr Kathrynn Humble  Primary Cardiologist New  HPI:   Asked to see this 72 y/o male for SOB.  Mr Sparks denies any past cardiac history up until his recent admission to [facility] last week. He is a smoker and has some history of COPD using inhalers PRN. He has treated HTN and anxiety. Thursday 04/09/18 he noted some "heart burn" and SOB. He saw his PCP who added an inhaler. This didn't really help and by Sunday 8/11 he was SOB enough to go to the ED at [facility]. He was admitted until Tuesday. He had a stress test (Nuclear) and Echo, and a CT scan. He thinks the stress test was OK, the echo apparently showed a "leaky valve" that might need repair. He was to f/u with a cardiologist in [place] but that hasn't been arranged yet. He came back to the ED today with increased dyspnea. CXR suggest CHF as does his BNP of 1549. He is admitted now for further evaluation.   PMHx:  Past Medical History:  Diagnosis Date  . Anxiety   . Arthritis    knees, neck, shoulder  . Elevated PSA    denies per patient  . Hyperplasia of prostate with lower urinary tract symptoms (LUTS)   . Hypertension   . 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 Wears hearing aid    bilateral    Past Surgical History:  Procedure Laterality Date  . COLONOSCOPY  2007  . NASAL SINUS SURGERY  2006  . TRANSANAL HEMORRHOIDAL DEARTERIALIZATION N/A 11/15/2015   Procedure: TRANSANAL HEMORRHOIDAL DEARTERIALIZATION;  Surgeon: Leighton Ruff, MD;  Location: [facility];  Service: General;  Laterality: N/A;    SOCHx:  reports that he has been smoking cigarettes. He has a 25.00 pack-year smoking history. He has never used smokeless tobacco. He reports that he does not drink alcohol or use drugs.  FAMHx: Family History  Problem Relation Age of Onset  . COPD Mother   . Heart attack Mother   . Colon polyps Father   . Prostate cancer Father   .  Parkinson's disease Father   . Hypertension Sister   . Hypertension Brother   . Breast cancer Sister   . Prostate cancer Paternal Uncle     ALLERGIES: No Known Allergies  ROS: Review of Systems: General: negative for chills, fever, night sweats or weight changes.  Cardiovascular: negative for edema, orthopnea, palpitations, paroxysmal nocturnal dyspnea  HEENT: negative for any visual disturbances, blindness, glaucoma Dermatological: negative for rash Respiratory: negative for cough, hemoptysis, or wheezing Urologic: negative for hematuria or dysuria Abdominal: negative for nausea, vomiting, diarrhea, bright red blood per rectum, melena, or hematemesis Neurologic: negative for visual changes, syncope, or dizziness Musculoskeletal: negative for back pain, joint pain, or swelling Psych: cooperative and appropriate All other systems reviewed and are otherwise negative except as noted above.   HOME MEDICATIONS: Prior to Admission medications   Medication Sig Start Date End Date Taking? Authorizing Provider  acetaminophen (TYLENOL) 500 MG tablet Take 500 mg by mouth 2 (two) times daily.   Yes [provider]  albuterol (PROVENTIL HFA;VENTOLIN HFA) 108 (90 BASE) MCG/ACT inhaler Inhale 2 puffs into the lungs every 6 (six) hours as needed for wheezing or shortness of breath. 02/01/15  Yes Guest, Benn Moulder, MD  Ascorbic Acid (VITAMIN C) 1000 MG tablet Take 1,000 mg by mouth daily.   Yes [provider]  Azelastine HCl 0.15 %  SOLN Place 2 sprays into both nostrils daily as needed (allergies).  03/03/18  Yes [provider]  BIOTIN PO Take 1 tablet by mouth daily.    Yes [provider]  buPROPion (WELLBUTRIN XL) 150 MG 24 hr tablet Take 150 mg daily by mouth. 05/28/17  Yes [provider]  CALCIUM PO Take 1 tablet by mouth daily.    Yes [provider]  carvedilol (COREG) 3.125 MG tablet Take 3.125 mg by mouth 2 (two) times daily with a meal.   04/16/18  Yes [provider]  clonazePAM (KLONOPIN) 1 MG tablet Take 1 mg by mouth 3 (three) times daily as needed for anxiety.   Yes [provider]  co-enzyme Q-10 30 MG capsule Take 30 mg by mouth 2 (two) times daily.   Yes [provider]  DULoxetine (CYMBALTA) 60 MG capsule Take 60 mg daily by mouth. 06/02/17  Yes [provider]  DUREZOL 0.05 % EMUL Place 1 drop into the right eye daily. 04/06/18  Yes [provider]  lisinopril (PRINIVIL,ZESTRIL) 30 MG tablet Take 30 mg daily by mouth. 05/15/17  Yes [provider]  montelukast (SINGULAIR) 10 MG tablet Take 10 mg daily by mouth. 05/28/17  Yes [provider]  moxifloxacin (VIGAMOX) 0.5 % ophthalmic solution Place 1 drop into the right eye daily. 04/06/18  Yes [provider]  polyethylene glycol powder (GLYCOLAX/MIRALAX) powder Take 17 g by mouth as needed for mild constipation.    Yes [provider]  primidone (MYSOLINE) 50 MG tablet TAKE 1 TABLET BY MOUTH AT  BEDTIME Patient taking differently: Take 50 mg by mouth at bedtime.  03/09/18  Yes Tat, Eustace Quail, DO  QUEtiapine (SEROQUEL) 50 MG tablet Take 25 mg by mouth at bedtime. 01/15/18  Yes [provider]  tamsulosin (FLOMAX) 0.4 MG CAPS capsule Take 0.4 mg daily by mouth.   Yes [provider]  tiZANidine (ZANAFLEX) 4 MG tablet Take 4 mg by mouth at bedtime.  05/16/17  Yes [provider]    HOSPITAL MEDICATIONS: I have reviewed the patient's current medications.  VITALS: Blood pressure (!) 153/73, pulse 95, temperature 98.7 F (37.1 C), temperature source Oral, resp. rate 17, SpO2 99 %.  PHYSICAL EXAM: General appearance: alert, cooperative, no distress and on O2 Neck: no carotid bruit and no JVD Lungs: decreased breath sounds, few rales Lt base Heart: regular rate and rhythm and 2/6 systolic murmur along LSB Abdomen: soft, non-tender; bowel sounds normal; no masses,  no  organomegaly Extremities: no edema Pulses: 2+ and symmetric Skin: Skin color, texture, turgor normal. No rashes or lesions Neurologic: Grossly normal  LABS: Results for orders placed or performed during the hospital encounter of 04/19/18 (from the past 24 hour(s))  I-stat troponin, ED     Status: None   Collection Time: 04/19/18 11:53 AM  Result Value Ref Range   Troponin i, poc 0.02 0.00 - 0.08 ng/mL   Comment 3          Comprehensive metabolic panel     Status: Abnormal   Collection Time: 04/19/18 12:33 PM  Result Value Ref Range   Sodium 128 (L) 135 - 145 mmol/L   Potassium 3.9 3.5 - 5.1 mmol/L   Chloride 93 (L) 98 - 111 mmol/L   CO2 23 22 - 32 mmol/L   Glucose, Bld 122 (H) 70 - 99 mg/dL   BUN 17 8 - 23 mg/dL   Creatinine, Ser 0.81 0.61 - 1.24 mg/dL  Calcium 8.9 8.9 - 10.3 mg/dL   Total Protein 6.5 6.5 - 8.1 g/dL   Albumin 3.4 (L) 3.5 - 5.0 g/dL   AST 26 15 - 41 U/L   ALT 22 0 - 44 U/L   Alkaline Phosphatase 61 38 - 126 U/L   Total Bilirubin 1.3 (H) 0.3 - 1.2 mg/dL   GFR calc non Af Amer >60 >60 mL/min   GFR calc Af Amer >60 >60 mL/min   Anion gap 12 5 - 15  Brain natriuretic peptide     Status: Abnormal   Collection Time: 04/19/18 12:33 PM  Result Value Ref Range   B Natriuretic Peptide 1,549.5 (H) 0.0 - 100.0 pg/mL  CBC with Differential     Status: Abnormal   Collection Time: 04/19/18 12:33 PM  Result Value Ref Range   WBC 8.2 4.0 - 10.5 K/uL   RBC 4.06 (L) 4.22 - 5.81 MIL/uL   Hemoglobin 14.2 13.0 - 17.0 g/dL   HCT 40.7 39.0 - 52.0 %   MCV 100.2 (H) 78.0 - 100.0 fL   MCH 35.0 (H) 26.0 - 34.0 pg   MCHC 34.9 30.0 - 36.0 g/dL   RDW 12.4 11.5 - 15.5 %   Platelets 187 150 - 400 K/uL   Neutrophils Relative % 87 %   Neutro Abs 7.1 1.7 - 7.7 K/uL   Lymphocytes Relative 8 %   Lymphs Abs 0.7 0.7 - 4.0 K/uL   Monocytes Relative 4 %   Monocytes Absolute 0.4 0.1 - 1.0 K/uL   Eosinophils Relative 0 %   Eosinophils Absolute 0.0 0.0 - 0.7 K/uL   Basophils Relative 0 %    Basophils Absolute 0.0 0.0 - 0.1 K/uL   Immature Granulocytes 1 %   Abs Immature Granulocytes 0.1 0.0 - 0.1 K/uL    EKG: NSR, Q V2, LAD  IMAGING: Dg Chest 2 View  Result Date: 04/19/2018 CLINICAL DATA:  Acute shortness of breath for 1 week. EXAM: CHEST - 2 VIEW COMPARISON:  04/12/2018 chest radiograph and CT FINDINGS: Cardiomegaly, interstitial opacities and small bilateral pleural effusions again noted. No pneumothorax. No acute bony abnormality. IMPRESSION: Cardiomegaly with interstitial pulmonary edema and small bilateral pleural effusions. Electronically Signed   By: Margarette Canada M.D.   On: 04/19/2018 11:49    IMPRESSION: Acute CHF- Etiology not yet determined- ? Secondary to MR, LVD, vs diastolic CHF  HTN- On Coreg and Lisinopril prior to admission  Murmur- Suspect he has MR  COPD- Long history of smoking, PRN inhalers prior to admission  Anxiety- On Clonopin, Zanaflex, Seroquel, Primidone,  and Cymbalta prior to admission  RECOMMENDATION: I will attempt to get records from [facility]. If we can get his echo and it shows significant MR- we can just proceed with TEE.  For now Lasix 80 mg x1, then 40 mg BID. Change ACE to ARB with the anticipation to discharging him on Entresto if he has significant LVD  Time Spent Directly with Patient: 50 minutes  Kerin Ransom, [place] beeper 04/19/2018, 2:57 PM   The patient was seen, examined and discussed with Kerin Ransom, PA-C and I agree with the above.   A very pleasant 72 year old patient with prior medical history of COPD, ongoing smoker on home inhalers and frequent wheezing who has noticed 2 weeks of worsening shortness of breath on exertion and at rest, heartburn and was admitted to [facility] on April 12, 2018.  We currently do not have records however the patient states that  he had chest CT, echocardiogram and a stress test performed.  He does not know all the results but he was told that he has an  valvular abnormalities that will need a repair, he does not know what his LVEF is he was told that his stress test was normal and does not know detail about his chest CTA.  At home he was using lisinopril and hydrochlorothiazide.  He was discharged on Tuesday, August 13.  He presented to the ER today for worsening dyspnea not relieved by inhalers, he was brought in by EMS and states that O2 via nasal cannula has helped with his shortness of breath but it still present.   On physical exam he is on O2 via nasal cannula, he is blood pressure is elevated at 180/70 mmHg, he has crackles bilaterally, loud 4 out of 6 holosystolic murmur at the left lower sternal border, and very minimal lower extremity edema.  His labs show BNP of 1549, sodium 128, Crea 0.8, hemoglobin 14.2.  Chest x-ray shows interstitial edema and small bilateral pleural effusions.  EKG shows sinus rhythm, nonspecific ST-T wave abnormalities otherwise normal EKG.  Assessment and plan: The patient presents with acute CHF, we will obtain records from [facility] specifically we need to know what is his systolic diastolic function and valvular abnormalities.  Based on physical exam it seems that he has severe mitral regurgitation.  He has signs of fluid overload, will give Lasix 80 mg IV x1 now and start 40 mg IV twice daily starting tomorrow in the morning.  We will start on losartan 50 mg daily as he is hypertensive, if his LVEF is less than 40% we will switch to [employer].  If he is mitral regurgitation is severe on the echo we will plan for TEE for further evaluation.   Ena Dawley, MD 04/19/2018

## 2018-04-19 NOTE — H&P (Signed)
History and Physical    Travis Reese QIH:[id number] DOB: November 20, 1945 DOA: 04/19/2018  PCP: Janine Limbo, PA-C   Patient coming from: home    Chief Complaint: SOB  HPI: Travis Reese is a 72 y.o. male with medical history significant of CAD, hypertension, BPH who comes in with shortness of breath, dyspnea on exertion.  Patient reports that for the past 2 to 3 weeks he has been having increasing dyspnea on exertion and shortness of breath.  He has noted some trace lower extremity edema and possibly some orthopnea but no clear symptoms.  He was admitted from 04/12/2018 to 04/14/2018 at [facility] where he reportedly had a stress test that was negative and an echo that showed he had a "leaky valve".  He was given IV furosemide and discharged on oral furosemide with an outpatient follow-up with cardiologist in [place] which is still pending.Marland Kitchen  He reports being compliant with his oral furosemide but not the dietary restrictions.  He reports that his urine output has decreased dramatically after taking the oral furosemide whereas with the IV furosemide it improved dramatically.  He currently reports that his dyspnea on exertion and shortness of breath have returned.  He is short of breath even at rest.  He denies any nausea, vomiting, diarrhea, fevers, chest pain, abdominal pain, rash, cough, congestion, rhinorrhea.  ED Course: In the ED patient was noted to be hypoxic requiring 2 L nasal cannula.  Noted to be mildly hypertensive.  Labs are notable for low sodium 128.  BNP was quite elevated at greater than 1500.  Troponin was negative.  X-ray showed pulmonary congestion, small pleural effusions consistent with heart failure.  Cardiology was consulted and recommended diuresis and they would obtain records from [place].  Review of Systems: As per HPI otherwise 10 point review of systems negative.   Past Medical History:  Diagnosis Date  . Anxiety   . Arthritis    knees, neck, shoulder  .  BPH (benign prostatic hyperplasia) 04/19/2018  . Elevated PSA    denies per patient  . HTN (hypertension) 04/19/2018  . Hyperplasia of prostate with lower urinary tract symptoms (LUTS)   . Hypertension   . 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 Wears hearing aid    bilateral    Past Surgical History:  Procedure Laterality Date  . COLONOSCOPY  2007  . NASAL SINUS SURGERY  2006  . TRANSANAL HEMORRHOIDAL DEARTERIALIZATION N/A 11/15/2015   Procedure: TRANSANAL HEMORRHOIDAL DEARTERIALIZATION;  Surgeon: Leighton Ruff, MD;  Location: [facility];  Service: General;  Laterality: N/A;     reports that he has been smoking cigarettes. He has a 25.00 pack-year smoking history. He has never used smokeless tobacco. He reports that he does not drink alcohol or use drugs.  No Known Allergies  Family History  Problem Relation Age of Onset  . COPD Mother   . Heart attack Mother   . Colon polyps Father   . Prostate cancer Father   . Parkinson's disease Father   . Hypertension Sister   . Hypertension Brother   . Breast cancer Sister   . Prostate cancer Paternal Uncle    Prior to Admission medications   Medication Sig Start Date End Date Taking? Authorizing Provider  acetaminophen (TYLENOL) 500 MG tablet Take 500 mg by mouth 2 (two) times daily.   Yes [provider]  albuterol (PROVENTIL HFA;VENTOLIN HFA) 108 (90 BASE) MCG/ACT inhaler Inhale 2 puffs into the lungs every 6 (six)  hours as needed for wheezing or shortness of breath. 02/01/15  Yes Guest, Benn Moulder, MD  Ascorbic Acid (VITAMIN C) 1000 MG tablet Take 1,000 mg by mouth daily.   Yes [provider]  Azelastine HCl 0.15 % SOLN Place 2 sprays into both nostrils daily as needed (allergies).  03/03/18  Yes [provider]  BIOTIN PO Take 1 tablet by mouth daily.    Yes [provider]  buPROPion (WELLBUTRIN XL) 150 MG 24 hr tablet Take 150 mg daily by mouth. 05/28/17  Yes [provider]  CALCIUM PO Take 1 tablet by mouth daily.    Yes [provider]  carvedilol (COREG) 3.125 MG tablet Take 3.125 mg by mouth 2 (two) times daily with a meal.  04/16/18  Yes [provider]  clonazePAM (KLONOPIN) 1 MG tablet Take 1 mg by mouth 3 (three) times daily as needed for anxiety.   Yes [provider]  co-enzyme Q-10 30 MG capsule Take 30 mg by mouth 2 (two) times daily.   Yes [provider]  DULoxetine (CYMBALTA) 60 MG capsule Take 60 mg daily by mouth. 06/02/17  Yes [provider]  DUREZOL 0.05 % EMUL Place 1 drop into the right eye daily. 04/06/18  Yes [provider]  lisinopril (PRINIVIL,ZESTRIL) 30 MG tablet Take 30 mg daily by mouth. 05/15/17  Yes [provider]  montelukast (SINGULAIR) 10 MG tablet Take 10 mg daily by mouth. 05/28/17  Yes [provider]  moxifloxacin (VIGAMOX) 0.5 % ophthalmic solution Place 1 drop into the right eye daily. 04/06/18  Yes [provider]  polyethylene glycol powder (GLYCOLAX/MIRALAX) powder Take 17 g by mouth as needed for mild constipation.    Yes [provider]  primidone (MYSOLINE) 50 MG tablet TAKE 1 TABLET BY MOUTH AT  BEDTIME Patient taking differently: Take 50 mg by mouth at bedtime.  03/09/18  Yes Tat, Eustace Quail, DO  QUEtiapine (SEROQUEL) 50 MG tablet Take 25 mg by mouth at bedtime. 01/15/18  Yes [provider]  tamsulosin (FLOMAX) 0.4 MG CAPS capsule Take 0.4 mg daily by mouth.   Yes [provider]  tiZANidine (ZANAFLEX) 4 MG tablet Take 4 mg by mouth at bedtime.  05/16/17  Yes [provider]    Physical Exam: Vitals:   04/19/18 1200 04/19/18 1230 04/19/18 1300 04/19/18 1330  BP: (!) 158/66 (!) 152/61 (!) 167/70 (!) 153/73  Pulse: 94 93 97 95  Resp: (!) 24 19 (!) 23 17  Temp:      TempSrc:      SpO2: 99% 98% 98% 99%    Constitutional: NAD, calm, comfortable Vitals:   04/19/18 1200 04/19/18 1230 04/19/18  1300 04/19/18 1330  BP: (!) 158/66 (!) 152/61 (!) 167/70 (!) 153/73  Pulse: 94 93 97 95  Resp: (!) 24 19 (!) 23 17  Temp:      TempSrc:      SpO2: 99% 98% 98% 99%   Eyes: Anicteric sclera ENMT: Mucous membranes, poor dentition Neck: normal, supple Respiratory: Mildly increased work of breathing, diminished lung sounds at bases, scattered crackles, no rhonchi, no wheezes.  Cardiovascular: Stent heart sounds, regular rate and rhythm, 2 out of 6 systolic murmur heard across per [facility] on right side.  Abdomen: no tenderness, no masses palpated. No hepatosplenomegaly. Bowel sounds positive.  Musculoskeletal: Left lower extremity edema.  Skin: no rashes skin Neurologic: Intact, moving all extremities Psychiatric: Normal judgment and insight. Alert and oriented x 3.  Normal mood.     Labs on Admission: I have personally reviewed following labs and imaging studies  CBC: Recent Labs  Lab 04/19/18 1233  WBC 8.2  NEUTROABS 7.1  HGB 14.2  HCT 40.7  MCV 100.2*  PLT 811   Basic Metabolic Panel: Recent Labs  Lab 04/19/18 1233  NA 128*  K 3.9  CL 93*  CO2 23  GLUCOSE 122*  BUN 17  CREATININE 0.81  CALCIUM 8.9   GFR: CrCl cannot be calculated (Unknown ideal weight.). Liver Function Tests: Recent Labs  Lab 04/19/18 1233  AST 26  ALT 22  ALKPHOS 61  BILITOT 1.3*  PROT 6.5  ALBUMIN 3.4*   No results for input(s): LIPASE, AMYLASE in the last 168 hours. No results for input(s): AMMONIA in the last 168 hours. Coagulation Profile: No results for input(s): INR, PROTIME in the last 168 hours. Cardiac Enzymes: No results for input(s): CKTOTAL, CKMB, CKMBINDEX, TROPONINI in the last 168 hours. BNP (last 3 results) No results for input(s): PROBNP in the last 8760 hours. HbA1C: No results for input(s): HGBA1C in the last 72 hours. CBG: No results for input(s): GLUCAP in the last 168 hours. Lipid Profile: No results for input(s): CHOL, HDL, LDLCALC, TRIG, CHOLHDL, LDLDIRECT  in the last 72 hours. Thyroid Function Tests: No results for input(s): TSH, T4TOTAL, FREET4, T3FREE, THYROIDAB in the last 72 hours. Anemia Panel: No results for input(s): VITAMINB12, FOLATE, FERRITIN, TIBC, IRON, RETICCTPCT in the last 72 hours. Urine analysis: No results found for: COLORURINE, APPEARANCEUR, LABSPEC, PHURINE, GLUCOSEU, HGBUR, BILIRUBINUR, KETONESUR, PROTEINUR, UROBILINOGEN, NITRITE, LEUKOCYTESUR  Radiological Exams on Admission: Dg Chest 2 View  Result Date: 04/19/2018 CLINICAL DATA:  Acute shortness of breath for 1 week. EXAM: CHEST - 2 VIEW COMPARISON:  04/12/2018 chest radiograph and CT FINDINGS: Cardiomegaly, interstitial opacities and small bilateral pleural effusions again noted. No pneumothorax. No acute bony abnormality. IMPRESSION: Cardiomegaly with interstitial pulmonary edema and small bilateral pleural effusions. Electronically Signed   By: Margarette Canada M.D.   On: 04/19/2018 11:49    EKG: Independently reviewed.  Sinus rhythm, no acute ST segment changes, unchanged from prior  Assessment/Plan Active Problems:   HTN (hypertension)   Acute CHF (congestive heart failure) (HCC)   BPH (benign prostatic hyperplasia)   Anxiety    #) Shortness of breath due to acute heart failure exacerbation likely due to valvular disease: Unclear etiology of her valvular disease however suspect possibly related to mitral valve possibly secondary to rheumatic fever versus myxomatous degeneration.  He does appear to be grossly volume overloaded.  He is currently diuresing extremely well. -Strict ins and outs, weigh daily, sodium restricted diet -IV furosemide 40 mg twice daily -Cardiology consulted, they will obtain records and putting echo from [place] to evaluate etiology of heart disease -Cardiology has changed ACE inhibitor to ARB in case patient requires neprisyln antagonist -Continue carvedilol 3.125 mg twice daily  #) Hyponatremia: Likely hypovolemic hyponatremia -  Diuresis per above  #) BPH: -Continue tamsulosin 0.4 mg daily  #) Hypertension: -Per above switching out ACE inhibitor for ARB -Continue beta-blocker  #)  psych/pain: -Continue bupropion 150 mg daily -Continue clonazepam 1 mg 3 times daily as needed -Continue quetiapine 25 mg nightly -Continue primidone 50 mg nightly -Continue duloxetine 60 mg daily  Fluids: Restricted Elect lites: Monitor and supplement Nutrition: Sodium restricted diet  Prophylaxis: Enoxaparin  Disposition: Pending resolution of fluid overload  Full code   Cristy Folks MD [facility]  If 7PM-7AM, please  contact night-coverage [email or web address] Password [facility]  04/19/2018, 3:53 PM

## 2018-04-19 NOTE — Progress Notes (Signed)
Records request sent to [facility] by primary service.  Kerin Ransom PA-C 04/19/2018 4:23 PM

## 2018-04-19 NOTE — ED Provider Notes (Signed)
[facility] EMERGENCY DEPARTMENT Provider Note   CSN: [id number] Arrival date & time: 04/19/18  1036     History   Chief Complaint Chief Complaint  Patient presents with  . Shortness of Breath    HPI Travis Reese is a 72 y.o. male.  HPI   RONDO SPITTLER is a 72 y.o. male, with a history of HTN, presenting to the ED with shortness of breath beginning last night. States, "It felt like I just couldn't get enough air." Has had a dry cough for a couple weeks, abdominal distension for the last week, and orthopnea.   Saw his PCP August 8 for shortness of breath, was prescribed an inhaler, which did not help.  Was seen and admitted to [facility] August 11. 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  Released from hospital Aug 13 with prescription for lasix and "another heart medicine." Was told [facility] cardiology would follow up with him to make an appointment. He has called, but they have not yet set up an appointment for him, stating they would get back to him.   Denies fever/chills, CP, abdominal pain, N/V/D, syncope, peripheral edema, or any other complaints.     Past Medical History:  Diagnosis Date  . Anxiety   . Arthritis    knees, neck, shoulder  . BPH (benign prostatic hyperplasia) 04/19/2018  . Elevated PSA    denies per patient  . HTN (hypertension) 04/19/2018  . Hyperplasia of prostate with lower urinary tract symptoms (LUTS)   . Hypertension   . 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 Wears hearing aid    bilateral    Patient Active Problem List   Diagnosis Date Noted  . HTN (hypertension) 04/19/2018  . Acute CHF (congestive heart failure) ([place]) 04/19/2018  . BPH (benign prostatic hyperplasia) 04/19/2018  . Anxiety 04/19/2018  . 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 COLONOSCOPY  2007  . NASAL SINUS SURGERY  2006  . TRANSANAL HEMORRHOIDAL DEARTERIALIZATION N/A 11/15/2015   Procedure: TRANSANAL HEMORRHOIDAL DEARTERIALIZATION;  Surgeon: Leighton Ruff, MD;  Location: [facility];  Service: General;  Laterality: N/A;        Home Medications    Prior to Admission medications   Medication Sig Start Date End Date Taking? Authorizing Provider  acetaminophen (TYLENOL) 500 MG tablet Take 500 mg by mouth 2 (two) times daily.   Yes [provider]  albuterol (PROVENTIL HFA;VENTOLIN HFA) 108 (90 BASE) MCG/ACT inhaler Inhale 2 puffs into the lungs every 6 (six) hours as needed for wheezing or shortness of breath. 02/01/15  Yes Guest, Benn Moulder, MD  Ascorbic Acid (VITAMIN C) 1000 MG tablet Take 1,000 mg by mouth daily.   Yes [provider]  Azelastine HCl 0.15 % SOLN Place 2 sprays into both nostrils daily as needed (allergies).  03/03/18  Yes [provider]  BIOTIN PO Take 1 tablet by mouth daily.    Yes [provider]  buPROPion (WELLBUTRIN XL) 150 MG 24 hr tablet Take 150 mg daily by mouth. 05/28/17  Yes [provider]  CALCIUM PO Take 1 tablet by mouth daily.    Yes [provider]  carvedilol (COREG) 3.125 MG tablet Take 3.125 mg by mouth 2 (two) times  daily with a meal.  04/16/18  Yes [provider]  clonazePAM (KLONOPIN) 1 MG tablet Take 1 mg by mouth 3 (three) times daily as needed for anxiety.   Yes [provider]  co-enzyme Q-10 30 MG capsule Take 30 mg by mouth 2 (two) times daily.   Yes [provider]  DULoxetine (CYMBALTA) 60 MG capsule Take 60 mg daily by mouth. 06/02/17  Yes [provider]  DUREZOL 0.05 % EMUL Place 1 drop into the right eye daily. 04/06/18  Yes [provider]  lisinopril (PRINIVIL,ZESTRIL) 30 MG tablet Take 30 mg daily by mouth. 05/15/17  Yes [provider]  montelukast (SINGULAIR) 10  MG tablet Take 10 mg daily by mouth. 05/28/17  Yes [provider]  moxifloxacin (VIGAMOX) 0.5 % ophthalmic solution Place 1 drop into the right eye daily. 04/06/18  Yes [provider]  polyethylene glycol powder (GLYCOLAX/MIRALAX) powder Take 17 g by mouth as needed for mild constipation.    Yes [provider]  primidone (MYSOLINE) 50 MG tablet TAKE 1 TABLET BY MOUTH AT  BEDTIME Patient taking differently: Take 50 mg by mouth at bedtime.  03/09/18  Yes Tat, Eustace Quail, DO  QUEtiapine (SEROQUEL) 50 MG tablet Take 25 mg by mouth at bedtime. 01/15/18  Yes [provider]  tamsulosin (FLOMAX) 0.4 MG CAPS capsule Take 0.4 mg daily by mouth.   Yes [provider]  tiZANidine (ZANAFLEX) 4 MG tablet Take 4 mg by mouth at bedtime.  05/16/17  Yes [provider]    Family History Family History  Problem Relation Age of Onset  . COPD Mother   . Heart attack Mother   . Colon polyps Father   . Prostate cancer Father   . Parkinson's disease Father   . Hypertension Sister   . Hypertension Brother   . Breast cancer Sister   . Prostate cancer Paternal Uncle     Social History Social History   Tobacco Use  . Smoking status: Current Every Day Smoker    Packs/day: 0.50    Years: 50.00    Pack years: 25.00    Types: Cigarettes  . Smokeless tobacco: Never Used  Substance Use Topics  . Alcohol use: No    Alcohol/week: 0.0 standard drinks  . Drug use: No     Allergies   Patient has no known allergies.   Review of Systems Review of Systems  Constitutional: Negative for chills, diaphoresis and fever.  Respiratory: Positive for cough and shortness of breath.   Cardiovascular: Negative for chest pain and leg swelling.  Gastrointestinal: Positive for abdominal distention. Negative for abdominal pain, diarrhea, nausea and vomiting.  Genitourinary: Negative for dysuria, frequency and hematuria.  All other systems reviewed and are  negative.    Physical Exam Updated Vital Signs BP (!) 157/70   Pulse 91   Temp 98.7 F (37.1 C) (Oral)   Resp 15   SpO2 96%   Physical Exam  Constitutional: He appears well-developed and well-nourished. No distress.  HENT:  Head: Normocephalic and atraumatic.  Eyes: Conjunctivae are normal.  Neck: Neck supple.  Cardiovascular: Normal rate, regular rhythm, normal heart sounds and intact distal pulses.  Pulmonary/Chest: Breath sounds normal.  Some conversational dyspnea.  Requiring 2 L supplemental O2 to maintain comfort and SPO2 greater than 94%.  Abdominal: Soft. There is no tenderness. There is no guarding.  Musculoskeletal: He exhibits no edema.  Lymphadenopathy:    He has no cervical adenopathy.  Neurological:  He is alert.  Skin: Skin is warm and dry. He is not diaphoretic.  Psychiatric: He has a normal mood and affect. His behavior is normal.  Nursing note and vitals reviewed.    ED Treatments / Results  Labs (all labs ordered are listed, but only abnormal results are displayed) Labs Reviewed  COMPREHENSIVE METABOLIC PANEL - Abnormal; Notable for the following components:      Result Value   Sodium 128 (*)    Chloride 93 (*)    Glucose, Bld 122 (*)    Albumin 3.4 (*)    Total Bilirubin 1.3 (*)    All other components within normal limits  BRAIN NATRIURETIC PEPTIDE - Abnormal; Notable for the following components:   B Natriuretic Peptide 1,549.5 (*)    All other components within normal limits  CBC WITH DIFFERENTIAL/PLATELET - Abnormal; Notable for the following components:   RBC 4.06 (*)    MCV 100.2 (*)    MCH 35.0 (*)    All other components within normal limits  URINALYSIS, ROUTINE W REFLEX MICROSCOPIC  I-STAT TROPONIN, ED    EKG EKG Interpretation  Date/Time:  Sunday April 19 2018 10:46:33 EDT Ventricular Rate:  93 PR Interval:    QRS Duration: 106 QT Interval:  386 QTC Calculation: 481 R Axis:   -40 Text Interpretation:  Sinus rhythm  Borderline prolonged PR interval Left axis deviation Borderline prolonged QT interval No significant change since last tracing Confirmed by Varney Biles ([id number]) on 04/19/2018 11:56:29 AM   Radiology Dg Chest 2 View  Result Date: 04/19/2018 CLINICAL DATA:  Acute shortness of breath for 1 week. EXAM: CHEST - 2 VIEW COMPARISON:  04/12/2018 chest radiograph and CT FINDINGS: Cardiomegaly, interstitial opacities and small bilateral pleural effusions again noted. No pneumothorax. No acute bony abnormality. IMPRESSION: Cardiomegaly with interstitial pulmonary edema and small bilateral pleural effusions. Electronically Signed   By: Margarette Canada M.D.   On: 04/19/2018 11:49    Procedures [place] bedside Date/Time: 04/19/2018 1:15 PM Performed by: Lorayne Bender, PA-C Authorized by: Lorayne Bender, PA-C  Consent: Verbal consent obtained. Risks and benefits: risks, benefits and alternatives were discussed Consent given by: patient Patient identity confirmed: verbally with patient and provided demographic data Local anesthesia used: no  Anesthesia: Local anesthesia used: no  Sedation: Patient sedated: no  Patient tolerance: Patient tolerated the procedure well with no immediate complications  .Critical Care Performed by: Lorayne Bender, PA-C Authorized by: Lorayne Bender, PA-C   Critical care provider statement:    Critical care time (minutes):  35   Critical care time was exclusive of:  Separately billable procedures and treating other patients   Critical care was necessary to treat or prevent imminent or life-threatening deterioration of the following conditions:  Cardiac failure   Critical care was time spent personally by me on the following activities:  Development of treatment plan with patient or surrogate, discussions with consultants, evaluation of patient's response to treatment, examination of patient, obtaining history from patient or surrogate, re-evaluation of patient's condition, pulse  oximetry, ordering and review of radiographic studies, ordering and review of laboratory studies, ordering and performing treatments and interventions and review of old charts   I assumed direction of critical care for this patient from another provider in my specialty: no     (including critical care time)    EMERGENCY DEPARTMENT [place] CARDIAC EXAM "Study: Limited Ultrasound of the Heart and Pericardium"  INDICATIONS:Dyspnea Multiple views of the heart and pericardium were  obtained in real-time with a multi-frequency probe.  PERFORMED KG:OVPCHE IMAGES ARCHIVED?: Yes LIMITATIONS:  Body habitus VIEWS USED: Subcostal 4 chamber, Parasternal long axis and Parasternal short axis INTERPRETATION: Cardiac tamponade absent   Medications Ordered in ED Medications  furosemide (LASIX) injection 40 mg (has no administration in time range)  furosemide (LASIX) injection 80 mg (has no administration in time range)  potassium chloride SA (K-DUR,KLOR-CON) CR tablet 20 mEq (has no administration in time range)  losartan (COZAAR) tablet 50 mg (has no administration in time range)  albuterol (PROVENTIL) (2.5 MG/3ML) 0.083% nebulizer solution 5 mg (5 mg Nebulization Given 04/19/18 1359)  furosemide (LASIX) injection 20 mg (20 mg Intravenous Given 04/19/18 1402)     Initial Impression / Assessment and Plan / ED Course  I have reviewed the triage vital signs and the nursing notes.  Pertinent labs & imaging results that were available during my care of the patient were reviewed by me and considered in my medical decision making (see chart for details).  Clinical Course as of Apr 19 1646  Sun Apr 19, 2018  1413 Spoke with Dr. Meda Coffee, cardiology. Would be happy to consult on this patient during their admission.   [SJ]  [id number] Spoke with Dr. Herbert Moors, hospitalist. Agrees to admit the patient.    [SJ]    Clinical Course User Index [SJ] Randi Poullard C, PA-C    Patient presents with shortness of breath.   Evidence of pulmonary edema, cardiomegaly, and elevated BNP giving suspicion for new onset CHF.  Hypoxia to 89% on room air requiring supplemental O2 to maintain adequate SPO2.  Patient did have work-up performed [facility] and request for records from that facility is pending. Patient to be admitted for further management.   Findings and plan of care discussed with Varney Biles, MD. Dr. Kathrynn Humble personally evaluated and examined this patient.   Vitals:   04/19/18 1200 04/19/18 1230 04/19/18 1300 04/19/18 1330  BP: (!) 158/66 (!) 152/61 (!) 167/70 (!) 153/73  Pulse: 94 93 97 95  Resp: (!) 24 19 (!) 23 17  Temp:      TempSrc:      SpO2: 99% 98% 98% 99%     Final Clinical Impressions(s) / ED Diagnoses   Final diagnoses:  Acute congestive heart failure, unspecified heart failure type [facility])    ED Discharge Orders    None       Lorayne Bender, PA-C 04/19/18 1647    Varney Biles, MD 04/24/18 [phone]

## 2018-04-19 NOTE — ED Triage Notes (Signed)
Pt arrives from home via [facility] c/o SOB, denies CP, states, "Felt real, real tight in my throat."  EMS reports giving 125 mgh solu medrol, 10 mg albuterol neb followed by a partial tx of albuterol and atrovent.  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

## 2018-04-19 NOTE — Progress Notes (Signed)
PHARMACIST - PHYSICIAN ORDER COMMUNICATION  CONCERNING: P&T Medication Policy on Herbal Medications  DESCRIPTION:  This patient's order for: Co-enzyme Q-10  has been noted.  This product(s) is classified as an "herbal" or natural product. Due to a lack of definitive safety studies or FDA approval, nonstandard manufacturing practices, plus the potential risk of unknown drug-drug interactions while on inpatient medications, the Pharmacy and Therapeutics Committee does not permit the use of "herbal" or natural products of this type within [facility].   ACTION TAKEN: The pharmacy department is unable to verify this order at this time and your patient has been informed of this safety policy. Please reevaluate patient's clinical condition at discharge and address if the herbal or natural product(s) should be resumed at that time.  Alycia Rossetti, PharmD, BCPS Pager: [phone] 8:20 PM

## 2018-04-19 NOTE — ED Notes (Signed)
Attempted to call report

## 2018-04-20 ENCOUNTER — Other Ambulatory Visit: Payer: Self-pay

## 2018-04-20 ENCOUNTER — Encounter (HOSPITAL_COMMUNITY): Payer: Self-pay | Admitting: General Practice

## 2018-04-20 DIAGNOSIS — I351 Nonrheumatic aortic (valve) insufficiency: Secondary | ICD-10-CM

## 2018-04-20 DIAGNOSIS — R0602 Shortness of breath: Secondary | ICD-10-CM

## 2018-04-20 LAB — BASIC METABOLIC PANEL
Anion gap: 11 (ref 5–15)
BUN: 17 mg/dL (ref 8–23)
CO2: 28 mmol/L (ref 22–32)
Calcium: 9.2 mg/dL (ref 8.9–10.3)
Chloride: 95 mmol/L — ABNORMAL LOW (ref 98–111)
Creatinine, Ser: 0.99 mg/dL (ref 0.61–1.24)
GFR calc Af Amer: >60 mL/min (ref >60)
GFR calc non Af Amer: >60 mL/min (ref >60)
Glucose, Bld: 107 mg/dL — ABNORMAL HIGH (ref 70–99)
Potassium: 3.9 mmol/L (ref 3.5–5.1)
Sodium: 134 mmol/L — ABNORMAL LOW (ref 135–145)

## 2018-04-20 LAB — HIV ANTIBODY (ROUTINE TESTING W REFLEX): HIV Screen 4th Generation wRfx: NONREACTIVE

## 2018-04-20 LAB — MAGNESIUM: Magnesium: 1.8 mg/dL (ref 1.7–2.4)

## 2018-04-20 MED ORDER — ENSURE ENLIVE PO LIQD
237.0000 mL | Freq: Two times a day (BID) | ORAL | Status: DC
  Administered 2018-04-20 – 2018-04-21 (×4): 237 mL via ORAL

## 2018-04-20 MED ORDER — GUAIFENESIN 100 MG/5ML PO SOLN
5.0000 mL | ORAL | Status: DC | PRN
  Administered 2018-04-20 – 2018-04-25 (×8): 100 mg via ORAL
  Filled 2018-04-20 (×4): qty 5
  Filled 2018-04-20: qty 25
  Filled 2018-04-20: qty 5
  Filled 2018-04-20: qty 25
  Filled 2018-04-20 (×2): qty 5

## 2018-04-20 MED ORDER — NICOTINE 14 MG/24HR TD PT24
14.0000 mg | MEDICATED_PATCH | Freq: Every day | TRANSDERMAL | Status: DC
  Administered 2018-04-20 – 2018-04-30 (×11): 14 mg via TRANSDERMAL
  Filled 2018-04-20 (×11): qty 1

## 2018-04-20 NOTE — Plan of Care (Signed)

## 2018-04-20 NOTE — Progress Notes (Signed)
Progress Note  Patient Name: Travis Reese Date of Encounter: 04/20/2018  Primary Cardiologist: Reola Calkins Meda Coffee)  Subjective   Patient overall feeling well, notes shortness of breath when off of oxygen or moving around. No chest pain.   Inpatient Medications    Scheduled Meds: . buPROPion  150 mg Oral Daily  . carvedilol  3.125 mg Oral BID WC  . DULoxetine  60 mg Oral Daily  . enoxaparin (LOVENOX) injection  40 mg Subcutaneous Q24H  . feeding supplement (ENSURE ENLIVE)  237 mL Oral BID BM  . furosemide  40 mg Intravenous BID  . losartan  50 mg Oral Daily  . potassium chloride  20 mEq Oral BID  . primidone  50 mg Oral QHS  . QUEtiapine  25 mg Oral QHS  . sodium chloride flush  3 mL Intravenous Q12H  . tamsulosin  0.4 mg Oral QPC supper  . tiZANidine  4 mg Oral QHS  . vitamin C  1,000 mg Oral Daily   Continuous Infusions: . sodium chloride     PRN Meds: sodium chloride, acetaminophen, clonazePAM, guaiFENesin, ondansetron (ZOFRAN) IV, sodium chloride flush   Vital Signs    Vitals:   04/20/18 0025 04/20/18 0045 04/20/18 0447 04/20/18 1000  BP: (!) 90/45 (!) 109/54 128/60 122/64  Pulse: 81 88 80 80  Resp: 18  18   Temp: 98.3 F (36.8 C)  98.6 F (37 C)   TempSrc: Oral  Oral   SpO2: 93% 96% 100%   Weight:   92.5 kg   Height:        Intake/Output Summary (Last 24 hours) at 04/20/2018 1104 Last data filed at 04/20/2018 1010 Gross per 24 hour  Intake 480 ml  Output 2300 ml  Net -1820 ml   Filed Weights   04/19/18 1915 04/20/18 0447  Weight: 91.2 kg 92.5 kg    Telemetry    NSR - Personally Reviewed  ECG    NSR - Personally Reviewed  Physical Exam   GEN: No acute distress.  O2 cannula in place Neck: supple, no appreciable JVD Cardiac: regular S1 and S2, no rubs or gallops. Distant heart sounds, did not appreciate diastolic murmur. 1/6 systolic murmur. Respiratory: Decreased breath sounds at bilateral bases GI: Soft, nontender, non-distended. Bowel  sounds normal MS: No edema; No deformity. Neuro:  Nonfocal, moves all limbs independently Psych: Normal affect   Labs    Chemistry Recent Labs  Lab 04/19/18 1233 04/20/18 0637  NA 128* 134*  K 3.9 3.9  CL 93* 95*  CO2 23 28  GLUCOSE 122* 107*  BUN 17 17  CREATININE 0.81 0.99  CALCIUM 8.9 9.2  PROT 6.5  --   ALBUMIN 3.4*  --   AST 26  --   ALT 22  --   ALKPHOS 61  --   BILITOT 1.3*  --   GFRNONAA >60 >60  GFRAA >60 >60  ANIONGAP 12 11     Hematology Recent Labs  Lab 04/19/18 1233  WBC 8.2  RBC 4.06*  HGB 14.2  HCT 40.7  MCV 100.2*  MCH 35.0*  MCHC 34.9  RDW 12.4  PLT 187    Cardiac EnzymesNo results for input(s): TROPONINI in the last 168 hours.  Recent Labs  Lab 04/19/18 1153  TROPIPOC 0.02     BNP Recent Labs  Lab 04/19/18 1233  BNP 1,549.5*     DDimer No results for input(s): DDIMER in the last 168 hours.   Radiology  Dg Chest 2 View  Result Date: 04/19/2018 CLINICAL DATA:  Acute shortness of breath for 1 week. EXAM: CHEST - 2 VIEW COMPARISON:  04/12/2018 chest radiograph and CT FINDINGS: Cardiomegaly, interstitial opacities and small bilateral pleural effusions again noted. No pneumothorax. No acute bony abnormality. IMPRESSION: Cardiomegaly with interstitial pulmonary edema and small bilateral pleural effusions. Electronically Signed   By: Margarette Canada M.D.   On: 04/19/2018 11:49    Cardiac Studies   Physical records from [facility] in chart, no imaging discs included.  Noted to have severe aortic insufficiency with mild dilation of aorta. Negative stress test.  Patient Profile     72 y.o. male without significant prior past cardiac history initially presented to [facility] one week ago, recently discharged and now admitted to [facility] with significant dyspnea. Records received from [facility] note severe aortic insufficiency, no images included.  Assessment & Plan    Dyspnea: severe and progressive, now feeling better on O2 and with  diuresis. Likely cause by severe aortic regurgitation -continue diuresis with lasix BID -severe AI noted on echo. No murmur appreciated, but this may be due to degree of regurgitation. Pulse pressure not significantly widened. Will repeat echo here as no images received, also need to know ventricular dimensions and any other valvular abnormalities. Will likely need CT surgery consult and possibly TEE depending on results of echo. He is hemodynamically stable currently and saturating well on nasal cannula. -has variable BP. On carvedilol and losartan. Continue for now, will continue to monitor   Time Spent Directly with Patient: I have spent a total of 35 minutes with the patient reviewing hospital notes, telemetry, EKGs, labs and examining the patient as well as establishing an assessment and plan that was discussed personally with the patient.  > 50% of time was spent in direct patient care.  Length of Stay:  LOS: 1 day   Buford Dresser, MD, PhD [facility]  [facility]   04/20/2018, 11:04 AM      For questions or updates, please contact [place] Please consult [email or web address] for contact info under Cardiology/STEMI.

## 2018-04-20 NOTE — Progress Notes (Signed)
PROGRESS NOTE    Travis Reese  YIR:[id number] DOB: Dec 18, 1945 DOA: 04/19/2018 PCP: Janine Limbo, PA-C    Brief Narrative: HPI: Travis Reese is a 72 y.o. male with medical history significant of CAD, hypertension, BPH who comes in with shortness of breath, dyspnea on exertion.  Patient reports that for the past 2 to 3 weeks he has been having increasing dyspnea on exertion and shortness of breath.  He has noted some trace lower extremity edema and possibly some orthopnea but no clear symptoms.  He was admitted from 04/12/2018 to 04/14/2018 at [facility] where he reportedly had a stress test that was negative and an echo that showed he had a "leaky valve".  He was given IV furosemide and discharged on oral furosemide with an outpatient follow-up with cardiologist in [place] which is still pending.Marland Kitchen  He reports being compliant with his oral furosemide but not the dietary restrictions.  He reports that his urine output has decreased dramatically after taking the oral furosemide whereas with the IV furosemide it improved dramatically.  He currently reports that his dyspnea on exertion and shortness of breath have returned.  He is short of breath even at rest.  He denies any nausea, vomiting, diarrhea, fevers, chest pain, abdominal pain, rash, cough, congestion, rhinorrhea.  ED Course: In the ED patient was noted to be hypoxic requiring 2 L nasal cannula.  Noted to be mildly hypertensive.  Labs are notable for low sodium 128.  BNP was quite elevated at greater than 1500.  Troponin was negative.  X-ray showed pulmonary congestion, small pleural effusions consistent with heart failure.  Cardiology was consulted and recommended diuresis and they would obtain records from [place].    Assessment & Plan:   Active Problems:   HTN (hypertension)   Acute CHF (congestive heart failure) (HCC)   BPH (benign prostatic hyperplasia)   Anxiety  1-Acute Heart failure exacerbation; Dyspnea, related  to Aortic regurgitation.  ECHO to be repeated here.  Continue with lasix.  Appreciate cardiology evaluation.  Might need CVTS consult depending on ECHO results.  Continue with carvedilol, cozaar.   Hyponatremia;  Improved.  Likely related to hypervolemia.   BPH;  Continue with Flomax.   HTN; continue with carvedilol, cozaar.   psych/pain: -Continue bupropion 150 mg daily -Continue clonazepam 1 mg 3 times daily as needed -Continue quetiapine 25 mg nightly -Continue primidone 50 mg nightly -Continue duloxetine 60 mg daily   DVT prophylaxis: Lovenox Code Status: full code.  Family Communication: no family at bedside.  Disposition Plan: remain inpatient.   Consultants:   cardiology  Procedures:  ECHO   Antimicrobials: none   Subjective: Feeling better, dyspnea improved.   Objective: Vitals:   04/20/18 0025 04/20/18 0045 04/20/18 0447 04/20/18 1000  BP: (!) 90/45 (!) 109/54 128/60 122/64  Pulse: 81 88 80 80  Resp: 18  18   Temp: 98.3 F (36.8 C)  98.6 F (37 C)   TempSrc: Oral  Oral   SpO2: 93% 96% 100%   Weight:   92.5 kg   Height:        Intake/Output Summary (Last 24 hours) at 04/20/2018 1118 Last data filed at 04/20/2018 1010 Gross per 24 hour  Intake 480 ml  Output 2300 ml  Net -1820 ml   Filed Weights   04/19/18 1915 04/20/18 0447  Weight: 91.2 kg 92.5 kg    Examination:  General exam: Appears calm and comfortable  Respiratory system: Clear to auscultation. Respiratory effort normal. Cardiovascular  system: S1 & S2 heard, RRR. No JVD, murmurs, rubs, gallops or clicks. No pedal edema. Gastrointestinal system: Abdomen is nondistended, soft and nontender. No organomegaly or masses felt. Normal bowel sounds heard. Central nervous system: Alert and oriented. No focal neurological deficits. Extremities: Symmetric 5 x 5 power. Skin: No rashes, lesions or ulcers    Data Reviewed: I have personally reviewed following labs and imaging  studies  CBC: Recent Labs  Lab 04/19/18 1233  WBC 8.2  NEUTROABS 7.1  HGB 14.2  HCT 40.7  MCV 100.2*  PLT 716   Basic Metabolic Panel: Recent Labs  Lab 04/19/18 1233 04/20/18 0637  NA 128* 134*  K 3.9 3.9  CL 93* 95*  CO2 23 28  GLUCOSE 122* 107*  BUN 17 17  CREATININE 0.81 0.99  CALCIUM 8.9 9.2  MG  --  1.8   GFR: Estimated Creatinine Clearance: 79.6 mL/min (by C-G formula based on SCr of 0.99 mg/dL). Liver Function Tests: Recent Labs  Lab 04/19/18 1233  AST 26  ALT 22  ALKPHOS 61  BILITOT 1.3*  PROT 6.5  ALBUMIN 3.4*   No results for input(s): LIPASE, AMYLASE in the last 168 hours. No results for input(s): AMMONIA in the last 168 hours. Coagulation Profile: No results for input(s): INR, PROTIME in the last 168 hours. Cardiac Enzymes: No results for input(s): CKTOTAL, CKMB, CKMBINDEX, TROPONINI in the last 168 hours. BNP (last 3 results) No results for input(s): PROBNP in the last 8760 hours. HbA1C: No results for input(s): HGBA1C in the last 72 hours. CBG: No results for input(s): GLUCAP in the last 168 hours. Lipid Profile: No results for input(s): CHOL, HDL, LDLCALC, TRIG, CHOLHDL, LDLDIRECT in the last 72 hours. Thyroid Function Tests: No results for input(s): TSH, T4TOTAL, FREET4, T3FREE, THYROIDAB in the last 72 hours. Anemia Panel: No results for input(s): VITAMINB12, FOLATE, FERRITIN, TIBC, IRON, RETICCTPCT in the last 72 hours. Sepsis Labs: No results for input(s): PROCALCITON, LATICACIDVEN in the last 168 hours.  No results found for this or any previous visit (from the past 240 hour(s)).       Radiology Studies: Dg Chest 2 View  Result Date: 04/19/2018 CLINICAL DATA:  Acute shortness of breath for 1 week. EXAM: CHEST - 2 VIEW COMPARISON:  04/12/2018 chest radiograph and CT FINDINGS: Cardiomegaly, interstitial opacities and small bilateral pleural effusions again noted. No pneumothorax. No acute bony abnormality. IMPRESSION:  Cardiomegaly with interstitial pulmonary edema and small bilateral pleural effusions. Electronically Signed   By: Margarette Canada M.D.   On: 04/19/2018 11:49        Scheduled Meds: . buPROPion  150 mg Oral Daily  . carvedilol  3.125 mg Oral BID WC  . DULoxetine  60 mg Oral Daily  . enoxaparin (LOVENOX) injection  40 mg Subcutaneous Q24H  . feeding supplement (ENSURE ENLIVE)  237 mL Oral BID BM  . furosemide  40 mg Intravenous BID  . losartan  50 mg Oral Daily  . nicotine  14 mg Transdermal Daily  . potassium chloride  20 mEq Oral BID  . primidone  50 mg Oral QHS  . QUEtiapine  25 mg Oral QHS  . sodium chloride flush  3 mL Intravenous Q12H  . tamsulosin  0.4 mg Oral QPC supper  . tiZANidine  4 mg Oral QHS  . vitamin C  1,000 mg Oral Daily   Continuous Infusions: . sodium chloride       LOS: 1 day    Time spent: 35  minutes.     Elmarie Shiley, MD [facility] Pager [phone]  If 7PM-7AM, please contact night-coverage [email or web address] Password [email or web address] 04/20/2018, 11:18 AM

## 2018-04-21 ENCOUNTER — Ambulatory Visit (HOSPITAL_COMMUNITY): Payer: Medicare Other

## 2018-04-21 LAB — BASIC METABOLIC PANEL
Anion gap: 8 (ref 5–15)
BUN: 23 mg/dL (ref 8–23)
CO2: 29 mmol/L (ref 22–32)
Calcium: 8.7 mg/dL — ABNORMAL LOW (ref 8.9–10.3)
Chloride: 94 mmol/L — ABNORMAL LOW (ref 98–111)
Creatinine, Ser: 0.94 mg/dL (ref 0.61–1.24)
GFR calc Af Amer: >60 mL/min (ref >60)
GFR calc non Af Amer: >60 mL/min (ref >60)
Glucose, Bld: 110 mg/dL — ABNORMAL HIGH (ref 70–99)
Potassium: 3.5 mmol/L (ref 3.5–5.1)
Sodium: 131 mmol/L — ABNORMAL LOW (ref 135–145)

## 2018-04-21 MED ORDER — ENSURE ENLIVE PO LIQD
237.0000 mL | ORAL | Status: DC
  Administered 2018-04-22 – 2018-04-30 (×9): 237 mL via ORAL

## 2018-04-21 MED ORDER — DIFLUPREDNATE 0.05 % OP EMUL: 1.0000 [drp] | Freq: Every day | OPHTHALMIC | Status: DC

## 2018-04-21 MED ORDER — POLYETHYLENE GLYCOL 3350 17 G PO PACK
17.0000 g | PACK | Freq: Two times a day (BID) | ORAL | Status: DC
  Administered 2018-04-21 – 2018-04-24 (×5): 17 g via ORAL
  Filled 2018-04-21 (×8): qty 1

## 2018-04-21 MED ORDER — GATIFLOXACIN 0.5 % OP SOLN
1.0000 [drp] | Freq: Four times a day (QID) | OPHTHALMIC | Status: DC
  Administered 2018-04-21 – 2018-04-30 (×38): 1 [drp] via OPHTHALMIC
  Filled 2018-04-21: qty 2.5

## 2018-04-21 MED ORDER — FUROSEMIDE 40 MG PO TABS
40.0000 mg | ORAL_TABLET | Freq: Every day | ORAL | Status: DC
  Administered 2018-04-22 – 2018-04-23 (×2): 40 mg via ORAL
  Filled 2018-04-21 (×2): qty 1

## 2018-04-21 MED ORDER — PREDNISOLONE ACETATE 1 % OP SUSP
1.0000 [drp] | Freq: Four times a day (QID) | OPHTHALMIC | Status: DC
  Administered 2018-04-21 – 2018-04-30 (×36): 1 [drp] via OPHTHALMIC
  Filled 2018-04-21: qty 5

## 2018-04-21 MED ORDER — SENNA 8.6 MG PO TABS
1.0000 | ORAL_TABLET | Freq: Every day | ORAL | Status: DC
  Administered 2018-04-21 – 2018-04-28 (×7): 8.6 mg via ORAL
  Filled 2018-04-21 (×10): qty 1

## 2018-04-21 MED ORDER — SALINE SPRAY 0.65 % NA SOLN
1.0000 | NASAL | Status: DC | PRN
  Administered 2018-04-21 – 2018-04-27 (×2): 1 via NASAL
  Filled 2018-04-21 (×2): qty 44

## 2018-04-21 NOTE — Progress Notes (Signed)
Pharmacist states they do not stock difluprednate. RN informed pt. Pt states he will see who can bring this from home.

## 2018-04-21 NOTE — Progress Notes (Signed)
Pt states he just spoke with his ophthalmologist and they want him on his prescribed eye drops while here. RN notified MD.

## 2018-04-21 NOTE — Progress Notes (Signed)
Pt states he does not take Difluprednate eye drops. He takes prednisolone acetate ophthalmic suspension. RN notified MD for order change.

## 2018-04-21 NOTE — Plan of Care (Signed)
  Problem: Health Behavior/Discharge Planning: Goal: Ability to manage health-related needs will improve Outcome: Progressing   Problem: Clinical Measurements: Goal: Ability to maintain clinical measurements within normal limits will improve Outcome: Progressing Goal: Respiratory complications will improve Outcome: Progressing   Problem: Activity: Goal: Risk for activity intolerance will decrease Outcome: Progressing   Problem: Nutrition: Goal: Adequate nutrition will be maintained Outcome: Progressing   Problem: Coping: Goal: Level of anxiety will decrease Outcome: Progressing   Problem: Elimination: Goal: Will not experience complications related to bowel motility Outcome: Progressing Goal: Will not experience complications related to urinary retention Outcome: Progressing   Problem: Pain Managment: Goal: General experience of comfort will improve Outcome: Progressing   Problem: Safety: Goal: Ability to remain free from injury will improve Outcome: Progressing

## 2018-04-21 NOTE — Progress Notes (Signed)
RN called Echo to see when pt would be seen. Echo states pt is on the list to be seen today.

## 2018-04-21 NOTE — Progress Notes (Signed)
Brief cardiology follow up note:  Patient seen today, feeling well overall. Still SOB with exertion but does well in bed. I ordered echo yesterday, but unfortunately as of this evening it has not yet been done. My team has spoken with the echo department to try and expedite the study. Unfortunately we need the actual images to confirm the severity of the AI as well as the LV dimensions before we consult with surgery. We will try to continue to expedite this process.  Buford Dresser, MD, PhD [facility]  [address] [place], [place] [address] [phone]

## 2018-04-21 NOTE — Progress Notes (Signed)
PROGRESS NOTE    Travis Reese  LPF:[id number] DOB: [birth year] DOA: 04/19/2018 PCP: Janine Limbo, PA-C    Brief Narrative: HPI: Travis Reese is a 72 y.o. male with medical history significant of CAD, hypertension, BPH who comes in with shortness of breath, dyspnea on exertion.  Patient reports that for the past 2 to 3 weeks he has been having increasing dyspnea on exertion and shortness of breath.  He has noted some trace lower extremity edema and possibly some orthopnea but no clear symptoms.  He was admitted from 04/12/2018 to 04/14/2018 at [facility] where he reportedly had a stress test that was negative and an echo that showed he had a "leaky valve".  He was given IV furosemide and discharged on oral furosemide with an outpatient follow-up with cardiologist in [place] which is still pending.Marland Kitchen  He reports being compliant with his oral furosemide but not the dietary restrictions.  He reports that his urine output has decreased dramatically after taking the oral furosemide whereas with the IV furosemide it improved dramatically.  He currently reports that his dyspnea on exertion and shortness of breath have returned.  He is short of breath even at rest.  He denies any nausea, vomiting, diarrhea, fevers, chest pain, abdominal pain, rash, cough, congestion, rhinorrhea.  ED Course: In the ED patient was noted to be hypoxic requiring 2 L nasal cannula.  Noted to be mildly hypertensive.  Labs are notable for low sodium 128.  BNP was quite elevated at greater than 1500.  Troponin was negative.  X-ray showed pulmonary congestion, small pleural effusions consistent with heart failure.  Cardiology was consulted and recommended diuresis and they would obtain records from [place].    Assessment & Plan:   Active Problems:   HTN (hypertension)   Acute CHF (congestive heart failure) (HCC)   BPH (benign prostatic hyperplasia)   Anxiety  1-Acute Heart failure exacerbation; Dyspnea, related  to Aortic regurgitation.  ECHO to be repeated here.  Continue with lasix.  Appreciate cardiology evaluation.  Might need CVTS consult depending on ECHO results.  Continue with carvedilol, cozaar.  Body mass index is 26.97 kg/m. Weight; 201-----204. Unclear if accurate. He is negative 2.9 L.  Continue with lasix.   Hyponatremia;  Improved, with lasix.,  Likely related to hypervolemia.   BPH;  Continue with Flomax.   HTN; continue with carvedilol, cozaar.   psych/pain: -Continue bupropion 150 mg daily -Continue clonazepam 1 mg 3 times daily as needed -Continue quetiapine 25 mg nightly -Continue primidone 50 mg nightly -Continue duloxetine 60 mg daily   DVT prophylaxis: Lovenox Code Status: full code.  Family Communication: no family at bedside.  Disposition Plan: remain inpatient.   Consultants:   cardiology  Procedures:  ECHO   Antimicrobials: none   Subjective: Dyspnea improved, not ata baseline.    Objective: Vitals:   04/21/18 0138 04/21/18 0534 04/21/18 0800 04/21/18 1151  BP: (!) 95/40 (!) 100/51 119/61 (!) 114/55  Pulse: 74 84 80 85  Resp: 16 18  18   Temp: 98 F (36.7 C) 98.3 F (36.8 C)  98 F (36.7 C)  TempSrc: Oral Oral  Oral  SpO2: 99% 98%  94%  Weight:  92.7 kg    Height:        Intake/Output Summary (Last 24 hours) at 04/21/2018 1255 Last data filed at 04/21/2018 1151 Gross per 24 hour  Intake 963 ml  Output 2075 ml  Net -1112 ml   Filed Weights   04/19/18 1915  04/20/18 0447 04/21/18 0534  Weight: 91.2 kg 92.5 kg 92.7 kg    Examination:  General exam: NAD Respiratory system: CTA Cardiovascular system: S 1, S 2 RRR Gastrointestinal system: BS present, soft, nt Central nervous system: non focal.  Extremities: Symmetric power.  Skin: No rashes.     Data Reviewed: I have personally reviewed following labs and imaging studies  CBC: Recent Labs  Lab 04/19/18 1233  WBC 8.2  NEUTROABS 7.1  HGB 14.2  HCT 40.7  MCV  100.2*  PLT 914   Basic Metabolic Panel: Recent Labs  Lab 04/19/18 1233 04/20/18 0637 04/21/18 0423  NA 128* 134* 131*  K 3.9 3.9 3.5  CL 93* 95* 94*  CO2 23 28 29   GLUCOSE 122* 107* 110*  BUN 17 17 23   CREATININE 0.81 0.99 0.94  CALCIUM 8.9 9.2 8.7*  MG  --  1.8  --    GFR: Estimated Creatinine Clearance: 83.8 mL/min (by C-G formula based on SCr of 0.94 mg/dL). Liver Function Tests: Recent Labs  Lab 04/19/18 1233  AST 26  ALT 22  ALKPHOS 61  BILITOT 1.3*  PROT 6.5  ALBUMIN 3.4*   No results for input(s): LIPASE, AMYLASE in the last 168 hours. No results for input(s): AMMONIA in the last 168 hours. Coagulation Profile: No results for input(s): INR, PROTIME in the last 168 hours. Cardiac Enzymes: No results for input(s): CKTOTAL, CKMB, CKMBINDEX, TROPONINI in the last 168 hours. BNP (last 3 results) No results for input(s): PROBNP in the last 8760 hours. HbA1C: No results for input(s): HGBA1C in the last 72 hours. CBG: No results for input(s): GLUCAP in the last 168 hours. Lipid Profile: No results for input(s): CHOL, HDL, LDLCALC, TRIG, CHOLHDL, LDLDIRECT in the last 72 hours. Thyroid Function Tests: No results for input(s): TSH, T4TOTAL, FREET4, T3FREE, THYROIDAB in the last 72 hours. Anemia Panel: No results for input(s): VITAMINB12, FOLATE, FERRITIN, TIBC, IRON, RETICCTPCT in the last 72 hours. Sepsis Labs: No results for input(s): PROCALCITON, LATICACIDVEN in the last 168 hours.  No results found for this or any previous visit (from the past 240 hour(s)).       Radiology Studies: No results found.      Scheduled Meds: . buPROPion  150 mg Oral Daily  . carvedilol  3.125 mg Oral BID WC  . Difluprednate  1 drop Right Eye Daily  . DULoxetine  60 mg Oral Daily  . enoxaparin (LOVENOX) injection  40 mg Subcutaneous Q24H  . feeding supplement (ENSURE ENLIVE)  237 mL Oral BID BM  . furosemide  40 mg Intravenous BID  . gatifloxacin  1 drop Right Eye  QID  . losartan  50 mg Oral Daily  . nicotine  14 mg Transdermal Daily  . potassium chloride  20 mEq Oral BID  . primidone  50 mg Oral QHS  . QUEtiapine  25 mg Oral QHS  . sodium chloride flush  3 mL Intravenous Q12H  . tamsulosin  0.4 mg Oral QPC supper  . tiZANidine  4 mg Oral QHS  . vitamin C  1,000 mg Oral Daily   Continuous Infusions: . sodium chloride       LOS: 2 days    Time spent: 35 minutes.     Elmarie Shiley, MD [facility] Pager [phone]  If 7PM-7AM, please contact night-coverage [email or web address] Password [facility] 04/21/2018, 12:55 PM

## 2018-04-21 NOTE — Plan of Care (Signed)
  Problem: Activity: °Goal: Risk for activity intolerance will decrease °Outcome: Progressing °  °Problem: Coping: °Goal: Level of anxiety will decrease °Outcome: Progressing °  °Problem: Safety: °Goal: Ability to remain free from injury will improve °Outcome: Progressing °  °Problem: Activity: °Goal: Capacity to carry out activities will improve °Outcome: Progressing °  °

## 2018-04-21 NOTE — Progress Notes (Signed)
RN spoke with pharmacy. Pharmacist states she can change pt's eyedrop order to the correct eye drop.

## 2018-04-21 NOTE — Progress Notes (Signed)
Pt requests saline spray and a laxative added to his medications. RN notified MD.

## 2018-04-21 NOTE — Plan of Care (Signed)
  Problem: Education: Goal: Knowledge of General Education information will improve Description Including pain rating scale, medication(s)/side effects and non-pharmacologic comfort measures Outcome: Progressing   Problem: Health Behavior/Discharge Planning: Goal: Ability to manage health-related needs will improve Outcome: Progressing   Problem: Clinical Measurements: Goal: Ability to maintain clinical measurements within normal limits will improve Outcome: Progressing   Problem: Nutrition: Goal: Adequate nutrition will be maintained Outcome: Progressing   Problem: Coping: Goal: Level of anxiety will decrease Outcome: Progressing   Problem: Elimination: Goal: Will not experience complications related to bowel motility Outcome: Progressing   Problem: Safety: Goal: Ability to remain free from injury will improve Outcome: Progressing   Problem: Education: Goal: Ability to demonstrate management of disease process will improve Outcome: Progressing   Problem: Cardiac: Goal: Ability to achieve and maintain adequate cardiopulmonary perfusion will improve Outcome: Progressing

## 2018-04-21 NOTE — Progress Notes (Signed)
Initial Nutrition Assessment  DOCUMENTATION CODES:   Not applicable  INTERVENTION:   -Decrease Ensure Enlive po to daily, each supplement provides 350 kcal and 20 grams of protein  NUTRITION DIAGNOSIS:   Increased nutrient needs related to chronic illness(CHF) as evidenced by estimated needs.  GOAL:   Patient will meet greater than or equal to 90% of their needs  MONITOR:   PO intake, Supplement acceptance, Labs, Weight trends, Skin, I & O's  REASON FOR ASSESSMENT:   Malnutrition Screening Tool    ASSESSMENT:   Travis Reese is a 72 y.o. male with medical history significant of CAD, hypertension, BPH who comes in with shortness of breath, dyspnea on exertion.   Pt admitted with acute heart failure exacerbation.   Spoke with pt at bedside, who reports a decreased appetite over the past 3 weeks, due to difficulty breathing ("it makes you lose interest in eating"). Over the past 3 weeks, pt was consuming 3 meals per day (Breakfast: cold cereal, Lunch: sandwich, Dinner: meat, starch, and vegetable). He shares this is much less than his typical intake (Breakfast: eggs, grits, and toast, Lunch: 2-3 sandwiches, Dinner: meat, starch, and vegetable). Pt also drinks Slim Fast daily as a meal replacement.   Pt reports improved appetite since hospitalization. Noted meal completion 100%.   Pt endorses some wt loss; he reports UBW is around 220# and now he weighs around 205# (however, this is not consistent with wt hx). Reviewed wt hx; pt has experienced a 7.9% wt loss over the past 4 months. Wt changes difficult to interpret due to CHF. 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   Discussed importance of good meal and supplement intake to promote healing. Pt is enjoying Ensure supplements provided here and is requesting continuing them until  appetite returns fully.   Labs reviewed.   NUTRITION - FOCUSED PHYSICAL EXAM:    Most Recent Value  Orbital Region  No depletion  Upper Arm Region  No depletion  Thoracic and Lumbar Region  No depletion  Buccal Region  No depletion  Temple Region  No depletion  Clavicle Bone Region  No depletion  Clavicle and Acromion Bone Region  No depletion  Scapular Bone Region  No depletion  Dorsal Hand  No depletion  Patellar Region  No depletion  Anterior Thigh Region  No depletion  Posterior Calf Region  No depletion  Edema (RD Assessment)  None  Hair  Reviewed  Eyes  Reviewed  Mouth  Reviewed  Skin  Reviewed  Nails  Reviewed       Diet Order:   Diet Order            Diet 2 gram sodium Room service appropriate? Yes; Fluid consistency: Thin  Diet effective now              EDUCATION NEEDS:   Education needs have been addressed  Skin:  Skin Assessment: Reviewed RN Assessment  Last BM:  04/19/18  Height:   Ht Readings from Last 1 Encounters:  04/19/18 6\' 1"  (1.854 m)    Weight:   Wt Readings from Last 1 Encounters:  04/21/18 92.7 kg    Ideal Body Weight:  83.6 kg  BMI:  Body mass index is 26.97 kg/m.  Estimated Nutritional Needs:   Kcal:  1900-2100  Protein:  90-105 grams  Fluid:  1.9-2.1 L    Sheryle Vice A. Jimmye Norman, RD, LDN, CDE Pager: [phone] After hours Pager: [phone]

## 2018-04-22 ENCOUNTER — Inpatient Hospital Stay (HOSPITAL_COMMUNITY): Payer: Medicare Other

## 2018-04-22 DIAGNOSIS — I351 Nonrheumatic aortic (valve) insufficiency: Secondary | ICD-10-CM

## 2018-04-22 DIAGNOSIS — F419 Anxiety disorder, unspecified: Secondary | ICD-10-CM

## 2018-04-22 LAB — ECHOCARDIOGRAM COMPLETE
Height: 73 in
Weight: 3270.4 oz

## 2018-04-22 LAB — CBC
HCT: 40 % (ref 39.0–52.0)
Hemoglobin: 14 g/dL (ref 13.0–17.0)
MCH: 35.4 pg — ABNORMAL HIGH (ref 26.0–34.0)
MCHC: 35 g/dL (ref 30.0–36.0)
MCV: 101 fL — ABNORMAL HIGH (ref 78.0–100.0)
Platelets: 224 10*3/uL (ref 150–400)
RBC: 3.96 MIL/uL — ABNORMAL LOW (ref 4.22–5.81)
RDW: 12.9 % (ref 11.5–15.5)
WBC: 9 10*3/uL (ref 4.0–10.5)

## 2018-04-22 LAB — BASIC METABOLIC PANEL
Anion gap: 8 (ref 5–15)
BUN: 22 mg/dL (ref 8–23)
CO2: 28 mmol/L (ref 22–32)
Calcium: 8.5 mg/dL — ABNORMAL LOW (ref 8.9–10.3)
Chloride: 94 mmol/L — ABNORMAL LOW (ref 98–111)
Creatinine, Ser: 0.84 mg/dL (ref 0.61–1.24)
GFR calc Af Amer: >60 mL/min (ref >60)
GFR calc non Af Amer: >60 mL/min (ref >60)
Glucose, Bld: 100 mg/dL — ABNORMAL HIGH (ref 70–99)
Potassium: 3.9 mmol/L (ref 3.5–5.1)
Sodium: 130 mmol/L — ABNORMAL LOW (ref 135–145)

## 2018-04-22 MED ORDER — PHENOL 1.4 % MT LIQD
1.0000 | OROMUCOSAL | Status: DC | PRN
  Filled 2018-04-22: qty 177

## 2018-04-22 NOTE — Plan of Care (Signed)
  Problem: Safety: Goal: Ability to remain free from injury will improve Outcome: Progressing   Problem: Activity: Goal: Capacity to carry out activities will improve Outcome: Progressing   

## 2018-04-22 NOTE — Progress Notes (Signed)
PROGRESS NOTE  ANGELO PRINDLE TGY:[id number] DOB: 08-03-1946 DOA: 04/19/2018 PCP: Janine Limbo, PA-C   LOS: 3 days   Brief Narrative / Interim history: Travis Medina Flowersis a 72 y.o.malewith medical history significant ofCAD, hypertension, BPH who comes in with shortness of breath, dyspnea on exertion. Patient reports that for the past 2 to 3 weeks he has been having increasing dyspnea on exertion and shortness of breath. He has noted some trace lower extremity edema and possibly some orthopnea but no clear symptoms. He was admitted from 04/12/2018 to 04/14/2018 at [facility] with shortness of breath, records reviewed and he responded to diuresis, had a 2D echo which was found to have severe aortic regurgitation and plans were for patient to have outpatient referral to cardiothoracic surgery.  He was discharged in improved condition but over the few days of being home he continued to be progressively short of breath and was admitted to the hospital again.  Assessment & Plan: Active Problems:   HTN (hypertension)   Acute CHF (congestive heart failure) (HCC)   BPH (benign prostatic hyperplasia)   Anxiety   Acute hypoxic respiratory failure due to pulmonary edema in the setting of severe aortic regurgitation -2D echo pending, cardiology consulted, if severe aortic regurgitation is confirmed we will consult cardiothoracic surgery -Continue Coreg, Cozaar -Continue IV Lasix, he is net -3 L, still short of breath with minimal activity  Hyponatremia -Likely in the setting of hyperkalemia, overall stable  BPH  -Continue Flomax  Hypertension -Continue Coreg, Cozaar  Depression/anxiety -Continue bupropion, clonazepam, Seroquel, primidone and duloxetine   DVT prophylaxis: Lovenox Code Status: Full code Family Communication: no family at bedside Disposition Plan: home when ready   Consultants:   Cardiology   Procedures:   2D echo: pending  Antimicrobials:  None     Subjective: -Complains of ongoing shortness of breath with minimal activity. He tells me that he just ambulated half an hour ago and still catching his breath even though he is in bed  Objective: Vitals:   04/21/18 1610 04/21/18 2028 04/22/18 0509 04/22/18 1159  BP: (!) 120/58 (!) 110/56 (!) 110/40 (!) 141/64  Pulse: 82 89 82 91  Resp:  18 18   Temp:  98.9 F (37.2 C) 98.5 F (36.9 C) 98.6 F (37 C)  TempSrc:  Oral Oral Oral  SpO2:  96% 98% 98%  Weight:      Height:        Intake/Output Summary (Last 24 hours) at 04/22/2018 1325 Last data filed at 04/22/2018 0900 Gross per 24 hour  Intake 480 ml  Output 676 ml  Net -196 ml   Filed Weights   04/19/18 1915 04/20/18 0447 04/21/18 0534  Weight: 91.2 kg 92.5 kg 92.7 kg    Examination:  Constitutional: NAD Eyes: PERRL, lids and conjunctivae normal Neck: normal, supple, no masses, no thyromegaly Respiratory: clear to auscultation bilaterally, no wheezing, no crackles.  Cardiovascular: Regular rate and rhythm, no murmurs / rubs / gallops. No LE edema. Abdomen: no tenderness. Bowel sounds positive.  Skin: no rashes Neurologic: CN 2-12 grossly intact. Strength 5/5 in all 4.  Psychiatric: Normal judgment and insight. Alert and oriented x 3. Normal mood.    Data Reviewed: I have independently reviewed following labs and imaging studies   CBC: Recent Labs  Lab 04/19/18 1233 04/22/18 0409  WBC 8.2 9.0  NEUTROABS 7.1  --   HGB 14.2 14.0  HCT 40.7 40.0  MCV 100.2* 101.0*  PLT 187 224  Basic Metabolic Panel: Recent Labs  Lab 04/19/18 1233 04/20/18 0637 04/21/18 0423 04/22/18 0409  NA 128* 134* 131* 130*  K 3.9 3.9 3.5 3.9  CL 93* 95* 94* 94*  CO2 23 28 29 28   GLUCOSE 122* 107* 110* 100*  BUN 17 17 23 22   CREATININE 0.81 0.99 0.94 0.84  CALCIUM 8.9 9.2 8.7* 8.5*  MG  --  1.8  --   --    GFR: Estimated Creatinine Clearance: 93.8 mL/min (by C-G formula based on SCr of 0.84 mg/dL). Liver Function  Tests: Recent Labs  Lab 04/19/18 1233  AST 26  ALT 22  ALKPHOS 61  BILITOT 1.3*  PROT 6.5  ALBUMIN 3.4*   No results for input(s): LIPASE, AMYLASE in the last 168 hours. No results for input(s): AMMONIA in the last 168 hours. Coagulation Profile: No results for input(s): INR, PROTIME in the last 168 hours. Cardiac Enzymes: No results for input(s): CKTOTAL, CKMB, CKMBINDEX, TROPONINI in the last 168 hours. BNP (last 3 results) No results for input(s): PROBNP in the last 8760 hours. HbA1C: No results for input(s): HGBA1C in the last 72 hours. CBG: No results for input(s): GLUCAP in the last 168 hours. Lipid Profile: No results for input(s): CHOL, HDL, LDLCALC, TRIG, CHOLHDL, LDLDIRECT in the last 72 hours. Thyroid Function Tests: No results for input(s): TSH, T4TOTAL, FREET4, T3FREE, THYROIDAB in the last 72 hours. Anemia Panel: No results for input(s): VITAMINB12, FOLATE, FERRITIN, TIBC, IRON, RETICCTPCT in the last 72 hours. Urine analysis:    Component Value Date/Time   COLORURINE STRAW (A) 04/19/2018 2159   APPEARANCEUR CLEAR 04/19/2018 2159   LABSPEC 1.004 (L) 04/19/2018 2159   PHURINE 7.0 04/19/2018 2159   GLUCOSEU NEGATIVE 04/19/2018 2159   HGBUR NEGATIVE 04/19/2018 2159   [place] NEGATIVE 04/19/2018 2159   [place] NEGATIVE 04/19/2018 2159   PROTEINUR NEGATIVE 04/19/2018 2159   NITRITE NEGATIVE 04/19/2018 2159   LEUKOCYTESUR NEGATIVE 04/19/2018 2159   Sepsis Labs: Invalid input(s): PROCALCITONIN, LACTICIDVEN  No results found for this or any previous visit (from the past 240 hour(s)).    Radiology Studies: No results found.   Scheduled Meds: . buPROPion  150 mg Oral Daily  . carvedilol  3.125 mg Oral BID WC  . DULoxetine  60 mg Oral Daily  . enoxaparin (LOVENOX) injection  40 mg Subcutaneous Q24H  . feeding supplement (ENSURE ENLIVE)  237 mL Oral Q24H  . furosemide  40 mg Oral Daily  . gatifloxacin  1 drop Right Eye QID  . losartan  50 mg Oral  Daily  . nicotine  14 mg Transdermal Daily  . polyethylene glycol  17 g Oral BID  . potassium chloride  20 mEq Oral BID  . prednisoLONE acetate  1 drop Right Eye QID  . primidone  50 mg Oral QHS  . QUEtiapine  25 mg Oral QHS  . senna  1 tablet Oral Daily  . sodium chloride flush  3 mL Intravenous Q12H  . tamsulosin  0.4 mg Oral QPC supper  . tiZANidine  4 mg Oral QHS  . vitamin C  1,000 mg Oral Daily   Continuous Infusions: . sodium chloride       Marzetta Board, MD, PhD [facility] Pager [phone]  If 7PM-7AM, please contact night-coverage [email or web address] Password [email or web address] 04/22/2018, 1:25 PM

## 2018-04-22 NOTE — Progress Notes (Signed)
  Echocardiogram 2D Echocardiogram has been performed.  Travis [facility] 04/22/2018, 2:50 PM

## 2018-04-22 NOTE — Progress Notes (Signed)
Progress Note  Patient Name: Travis [facility] Date of Encounter: 04/22/2018  Primary Cardiologist: Reola Calkins Meda Coffee)  Subjective   Patient comfortable in bed, but very short of breath with movement. Has mild cough.  Inpatient Medications    Scheduled Meds: . buPROPion  150 mg Oral Daily  . carvedilol  3.125 mg Oral BID WC  . DULoxetine  60 mg Oral Daily  . enoxaparin (LOVENOX) injection  40 mg Subcutaneous Q24H  . feeding supplement (ENSURE ENLIVE)  237 mL Oral Q24H  . furosemide  40 mg Oral Daily  . gatifloxacin  1 drop Right Eye QID  . losartan  50 mg Oral Daily  . nicotine  14 mg Transdermal Daily  . polyethylene glycol  17 g Oral BID  . potassium chloride  20 mEq Oral BID  . prednisoLONE acetate  1 drop Right Eye QID  . primidone  50 mg Oral QHS  . QUEtiapine  25 mg Oral QHS  . senna  1 tablet Oral Daily  . sodium chloride flush  3 mL Intravenous Q12H  . tamsulosin  0.4 mg Oral QPC supper  . tiZANidine  4 mg Oral QHS  . vitamin C  1,000 mg Oral Daily   Continuous Infusions: . sodium chloride     PRN Meds: sodium chloride, acetaminophen, clonazePAM, guaiFENesin, ondansetron (ZOFRAN) IV, phenol, sodium chloride, sodium chloride flush   Vital Signs    Vitals:   04/21/18 1610 04/21/18 2028 04/22/18 0509 04/22/18 1159  BP: (!) 120/58 (!) 110/56 (!) 110/40 (!) 141/64  Pulse: 82 89 82 91  Resp:  18 18   Temp:  98.9 F (37.2 C) 98.5 F (36.9 C) 98.6 F (37 C)  TempSrc:  Oral Oral Oral  SpO2:  96% 98% 98%  Weight:      Height:        Intake/Output Summary (Last 24 hours) at 04/22/2018 1228 Last data filed at 04/22/2018 0900 Gross per 24 hour  Intake 480 ml  Output 676 ml  Net -196 ml   Filed Weights   04/19/18 1915 04/20/18 0447 04/21/18 0534  Weight: 91.2 kg 92.5 kg 92.7 kg    Telemetry    NSR - Personally Reviewed  ECG    NSR - Personally Reviewed  Physical Exam   GEN: No acute distress.  O2 cannula in place Neck: supple, no appreciable  JVD Cardiac: regular S1 and S2, no rubs or gallops. Distant heart sounds, did not appreciate diastolic murmur. 1/6 systolic murmur. Respiratory: Decreased breath sounds at bilateral bases GI: Soft, nontender, non-distended. Bowel sounds normal MS: No edema; No deformity. Neuro:  Nonfocal, moves all limbs independently Psych: Normal affect   Labs    Chemistry Recent Labs  Lab 04/19/18 1233 04/20/18 0637 04/21/18 0423 04/22/18 0409  NA 128* 134* 131* 130*  K 3.9 3.9 3.5 3.9  CL 93* 95* 94* 94*  CO2 23 28 29 28   GLUCOSE 122* 107* 110* 100*  BUN 17 17 23 22   CREATININE 0.81 0.99 0.94 0.84  CALCIUM 8.9 9.2 8.7* 8.5*  PROT 6.5  --   --   --   ALBUMIN 3.4*  --   --   --   AST 26  --   --   --   ALT 22  --   --   --   ALKPHOS 61  --   --   --   BILITOT 1.3*  --   --   --   GFRNONAA >60 >  60 >60 >60  GFRAA >60 >60 >60 >60  ANIONGAP 12 11 8 8      Hematology Recent Labs  Lab 04/19/18 1233 04/22/18 0409  WBC 8.2 9.0  RBC 4.06* 3.96*  HGB 14.2 14.0  HCT 40.7 40.0  MCV 100.2* 101.0*  MCH 35.0* 35.4*  MCHC 34.9 35.0  RDW 12.4 12.9  PLT 187 224    Cardiac EnzymesNo results for input(s): TROPONINI in the last 168 hours.  Recent Labs  Lab 04/19/18 1153  TROPIPOC 0.02     BNP Recent Labs  Lab 04/19/18 1233  BNP 1,549.5*     DDimer No results for input(s): DDIMER in the last 168 hours.   Radiology    No results found.  Cardiac Studies   Physical records from [place] in chart, no imaging discs included.  Noted to have severe aortic insufficiency with mild dilation of aorta. Negative stress test.  Echo is pending.  Patient Profile     72 y.o. male without significant prior past cardiac history initially presented to [facility] one week ago, recently discharged and now admitted to [facility] with significant dyspnea. Records received from [facility] note severe aortic insufficiency, no images included.  Assessment & Plan    Dyspnea: severe and progressive, now  feeling better on O2 and with diuresis but still significantly short of breath with exertion. Likely cause by severe aortic regurgitation -continue diuresis -severe AI noted on  echo. Awaiting images here to verify degree of valve disease and refer to CT surgery. If severe AI again seen, will consult CT surgery. If CT surgery needs further images with TEE, will schedule -has variable BP. On carvedilol and losartan. Continue for now, will continue to monitor   Time Spent Directly with Patient: I have spent a total of 15 minutes with the patient reviewing hospital notes, telemetry, EKGs, labs and examining the patient as well as establishing an assessment and plan that was discussed personally with the patient.  > 50% of time was spent in direct patient care.  Length of Stay:  LOS: 3 days   Buford Dresser, MD, PhD [facility]  [facility]   04/22/2018, 12:28 PM      For questions or updates, please contact [place] Please consult [email or web address] for contact info under Cardiology/STEMI.

## 2018-04-22 NOTE — Progress Notes (Deleted)
*  PRELIMINARY RESULTS* Echocardiogram 2D Echocardiogram has been performed.  Travis Reese 04/22/2018, 2:48 PM

## 2018-04-23 ENCOUNTER — Encounter (HOSPITAL_COMMUNITY): Payer: Self-pay | Admitting: Thoracic Surgery (Cardiothoracic Vascular Surgery)

## 2018-04-23 ENCOUNTER — Inpatient Hospital Stay (HOSPITAL_COMMUNITY): Payer: Medicare Other

## 2018-04-23 DIAGNOSIS — I351 Nonrheumatic aortic (valve) insufficiency: Secondary | ICD-10-CM | POA: Diagnosis present

## 2018-04-23 LAB — BASIC METABOLIC PANEL
Anion gap: 7 (ref 5–15)
BUN: 17 mg/dL (ref 8–23)
CO2: 32 mmol/L (ref 22–32)
Calcium: 8.7 mg/dL — ABNORMAL LOW (ref 8.9–10.3)
Chloride: 89 mmol/L — ABNORMAL LOW (ref 98–111)
Creatinine, Ser: 0.83 mg/dL (ref 0.61–1.24)
GFR calc Af Amer: >60 mL/min (ref >60)
GFR calc non Af Amer: >60 mL/min (ref >60)
Glucose, Bld: 100 mg/dL — ABNORMAL HIGH (ref 70–99)
Potassium: 5.6 mmol/L — ABNORMAL HIGH (ref 3.5–5.1)
Sodium: 128 mmol/L — ABNORMAL LOW (ref 135–145)

## 2018-04-23 LAB — CBC
HCT: 39 % (ref 39.0–52.0)
Hemoglobin: 13.6 g/dL (ref 13.0–17.0)
MCH: 35.3 pg — ABNORMAL HIGH (ref 26.0–34.0)
MCHC: 34.9 g/dL (ref 30.0–36.0)
MCV: 101.3 fL — ABNORMAL HIGH (ref 78.0–100.0)
Platelets: 200 10*3/uL (ref 150–400)
RBC: 3.85 MIL/uL — ABNORMAL LOW (ref 4.22–5.81)
RDW: 12.6 % (ref 11.5–15.5)
WBC: 7.5 10*3/uL (ref 4.0–10.5)

## 2018-04-23 MED ORDER — IOPAMIDOL (ISOVUE-370) INJECTION 76%
INTRAVENOUS | Status: AC
  Administered 2018-04-23: 20:00:00
  Filled 2018-04-23: qty 100

## 2018-04-23 MED ORDER — CARVEDILOL 3.125 MG PO TABS
3.1250 mg | ORAL_TABLET | Freq: Two times a day (BID) | ORAL | Status: DC
  Administered 2018-04-24 – 2018-04-30 (×14): 3.125 mg via ORAL
  Filled 2018-04-23 (×14): qty 1

## 2018-04-23 MED ORDER — FUROSEMIDE 10 MG/ML IJ SOLN
40.0000 mg | Freq: Two times a day (BID) | INTRAMUSCULAR | Status: DC
  Administered 2018-04-23 (×2): 40 mg via INTRAVENOUS
  Filled 2018-04-23 (×3): qty 4

## 2018-04-23 MED ORDER — IOPAMIDOL (ISOVUE-370) INJECTION 76%
100.0000 mL | Freq: Once | INTRAVENOUS | Status: AC | PRN
  Administered 2018-04-23: 100 mL via INTRAVENOUS

## 2018-04-23 NOTE — Progress Notes (Signed)
PROGRESS NOTE  Travis Reese YIR:[id number] DOB: Aug 27, 1946 DOA: 04/19/2018 PCP: Travis Limbo, PA-C   LOS: 4 days   Brief Narrative / Interim history: Travis Medina Flowersis a 72 y.o.malewith medical history significant ofCAD, hypertension, BPH who comes in with shortness of breath, dyspnea on exertion. Patient reports that for the past 2 to 3 weeks he has been having increasing dyspnea on exertion and shortness of breath. He has noted some trace lower extremity edema and possibly some orthopnea but no clear symptoms. He was admitted from 04/12/2018 to 04/14/2018 at [facility] with shortness of breath, records reviewed and he responded to diuresis, had a 2D echo which was found to have severe aortic regurgitation and plans were for patient to have outpatient referral to cardiothoracic surgery.  He was discharged in improved condition but over the few days of being home he continued to be progressively short of breath and was admitted to the [facility] again.  Assessment & Plan: Principal Problem:   Severe aortic insufficiency Active Problems:   HTN (hypertension)   Acute CHF (congestive heart failure) (HCC)   BPH (benign prostatic hyperplasia)   Anxiety   Acute hypoxic respiratory failure due to pulmonary edema in the setting of severe aortic regurgitation -2D echo done 8/21 showed severe aortic regurgitation, cardiology following, thoracic surgery consulted, appreciate input -Continue Coreg, Cozaar -He is more short of breath today, gaining fluid again, will put back on IV Lasix  Hyponatremia -Likely in the setting of hyperkalemia, back on IV Lasix today as he appears to be getting more fluid overloaded with weight gain   BPH  -Continue Flomax  Hypertension -Continue Coreg, Cozaar  Depression/anxiety -Continue bupropion, clonazepam, Seroquel, primidone and duloxetine   DVT prophylaxis: Lovenox Code Status: Full code Family Communication: no family at  bedside Disposition Plan: home when ready   Consultants:   Cardiology   Cardiothoracic surgery   Procedures:   2D echo Study Conclusions - Left ventricle: The cavity size was normal. Wall thickness was increased in a pattern of mild LVH. Systolic function was normal. The estimated ejection fraction was in the range of 50% to 55%. Diffuse hypokinesis. The study is not technically sufficient to allow evaluation of LV diastolic function. - Aortic valve: There was severe regurgitation. - Left atrium: Volume/bsa, ES (1-plane Simpson&'s, A4C): 34.3 ml/m^2.  Antimicrobials:  None    Subjective: -Still short of breath, went to the bathroom and that nearly wiped him out.  He is coughing more with white frothy sputum  Objective: Vitals:   04/23/18 0611 04/23/18 0757 04/23/18 0825 04/23/18 1309  BP: (!) 134/56  129/65 (!) 143/59  Pulse: 88  87 88  Resp: 16   20  Temp: 98.3 F (36.8 C)   98.3 F (36.8 C)  TempSrc: Oral   Oral  SpO2: 98%   99%  Weight:  93.7 kg    Height:        Intake/Output Summary (Last 24 hours) at 04/23/2018 1356 Last data filed at 04/23/2018 1300 Gross per 24 hour  Intake 600 ml  Output 1950 ml  Net -1350 ml   Filed Weights   04/20/18 0447 04/21/18 0534 04/23/18 0757  Weight: 92.5 kg 92.7 kg 93.7 kg    Examination:  Constitutional: No distress at rest Eyes: No scleral icterus Respiratory: Moves air well, bibasilar crackles, no wheezing. Cardiovascular: Regular rate and rhythm, no murmurs heard.  Trace lower extremity edema Abdomen: Soft, nontender, nondistended, positive bowel sounds Skin: See Neurologic: Nonfocal, equal strength  Psychiatric: Normal judgment and insight. Alert and oriented x 3. Normal mood.    Data Reviewed: I have independently reviewed following labs and imaging studies   CBC: Recent Labs  Lab 04/19/18 1233 04/22/18 0409 04/23/18 0507  WBC 8.2 9.0 7.5  NEUTROABS 7.1  --   --   HGB 14.2 14.0 13.6  HCT 40.7 40.0 39.0   MCV 100.2* 101.0* 101.3*  PLT 187 224 175   Basic Metabolic Panel: Recent Labs  Lab 04/19/18 1233 04/20/18 0637 04/21/18 0423 04/22/18 0409 04/23/18 0507  NA 128* 134* 131* 130* 128*  K 3.9 3.9 3.5 3.9 5.6*  CL 93* 95* 94* 94* 89*  CO2 23 28 29 28  32  GLUCOSE 122* 107* 110* 100* 100*  BUN 17 17 23 22 17   CREATININE 0.81 0.99 0.94 0.84 0.83  CALCIUM 8.9 9.2 8.7* 8.5* 8.7*  MG  --  1.8  --   --   --    GFR: Estimated Creatinine Clearance: 94.9 mL/min (by C-G formula based on SCr of 0.83 mg/dL). Liver Function Tests: Recent Labs  Lab 04/19/18 1233  AST 26  ALT 22  ALKPHOS 61  BILITOT 1.3*  PROT 6.5  ALBUMIN 3.4*   No results for input(s): LIPASE, AMYLASE in the last 168 hours. No results for input(s): AMMONIA in the last 168 hours. Coagulation Profile: No results for input(s): INR, PROTIME in the last 168 hours. Cardiac Enzymes: No results for input(s): CKTOTAL, CKMB, CKMBINDEX, TROPONINI in the last 168 hours. BNP (last 3 results) No results for input(s): PROBNP in the last 8760 hours. HbA1C: No results for input(s): HGBA1C in the last 72 hours. CBG: No results for input(s): GLUCAP in the last 168 hours. Lipid Profile: No results for input(s): CHOL, HDL, LDLCALC, TRIG, CHOLHDL, LDLDIRECT in the last 72 hours. Thyroid Function Tests: No results for input(s): TSH, T4TOTAL, FREET4, T3FREE, THYROIDAB in the last 72 hours. Anemia Panel: No results for input(s): VITAMINB12, FOLATE, FERRITIN, TIBC, IRON, RETICCTPCT in the last 72 hours. Urine analysis:    Component Value Date/Time   COLORURINE STRAW (A) 04/19/2018 2159   APPEARANCEUR CLEAR 04/19/2018 2159   LABSPEC 1.004 (L) 04/19/2018 2159   PHURINE 7.0 04/19/2018 2159   GLUCOSEU NEGATIVE 04/19/2018 2159   HGBUR NEGATIVE 04/19/2018 2159   [place] NEGATIVE 04/19/2018 2159   [place] NEGATIVE 04/19/2018 2159   PROTEINUR NEGATIVE 04/19/2018 2159   NITRITE NEGATIVE 04/19/2018 2159   LEUKOCYTESUR NEGATIVE  04/19/2018 2159   Sepsis Labs: Invalid input(s): PROCALCITONIN, LACTICIDVEN  No results found for this or any previous visit (from the past 240 hour(s)).    Radiology Studies: No results found.   Scheduled Meds: . buPROPion  150 mg Oral Daily  . carvedilol  3.125 mg Oral BID WC  . DULoxetine  60 mg Oral Daily  . enoxaparin (LOVENOX) injection  40 mg Subcutaneous Q24H  . feeding supplement (ENSURE ENLIVE)  237 mL Oral Q24H  . furosemide  40 mg Intravenous BID  . gatifloxacin  1 drop Right Eye QID  . losartan  50 mg Oral Daily  . nicotine  14 mg Transdermal Daily  . polyethylene glycol  17 g Oral BID  . prednisoLONE acetate  1 drop Right Eye QID  . primidone  50 mg Oral QHS  . QUEtiapine  25 mg Oral QHS  . senna  1 tablet Oral Daily  . sodium chloride flush  3 mL Intravenous Q12H  . tamsulosin  0.4 mg Oral QPC supper  .  tiZANidine  4 mg Oral QHS  . vitamin C  1,000 mg Oral Daily   Continuous Infusions: . sodium chloride       Marzetta Board, MD, PhD [facility] Pager [phone]  If 7PM-7AM, please contact night-coverage [email or web address] Password [email or web address] 04/23/2018, 1:56 PM

## 2018-04-23 NOTE — Consult Note (Addendum)
[address]       [place] [address]             [phone]          CARDIOTHORACIC SURGERY CONSULTATION REPORT  PCP is O'Buch, Alvis Lemmings, PA-C Referring Provider is Cindi Carbon, MD Primary Cardiologist is Dorothy Spark, MD (new)  Reason for consultation:  Severe aortic insufficiency  HPI:  Patient is a 72 year old male with history of hypertension and long-standing tobacco use with no other cardiac risk factors who has been referred for surgical consultation to discuss treatment options for management of severe symptomatic aortic insufficiency in the setting of acute diastolic congestive heart failure.  The patient denies any significant previous cardiac history.  He specifically denies ever having been told that he had a heart murmur in the past.  He denies any known history of rheumatic fever.  He reports a 6 to 32-month history of progressive symptoms which initially were described as worsening fatigue with lower extremity weakness.  He began to experience progressive exertional shortness of breath.  Symptoms became more acutely worse several weeks ago.  Ultimately the patient was hospitalized at [facility] from August 11 through April 14, 2018.  The patient states that at time of his hospital admission at [facility] he had resting shortness of breath, orthopnea, and edema.  He was treated with diuretic therapy and improved.  Echocardiogram performed at that time revealed severe aortic insufficiency.  He was referred for cardiothoracic surgical consultation.  He was not referred to a cardiologist and he was never seen by a cardiologist during his hospitalization.  He states that he was discharged home on oral Lasix, and at the time of hospital discharge the patient states that he was doing fairly well..  Shortly after hospital discharge his symptoms of shortness of breath began to get worse.  He presented acutely to [facility] on April 19, 2018 with another acute exacerbation of congestive heart failure including resting shortness of breath, orthopnea, lower extremity edema, and pulmonary edema on admission chest x-ray.  Baseline BNP level was >1,500 with Troponin 0.02.  EKG revealed sinus rhythm without ischemic changes.  The patient was seen in consultation by Dr. Meda Coffee and has been treated with diuretic therapy under the care of Dr. Reymundo Poll and the hospitalist team.  His weight has progressively increased, up >5 lbs since admission.  Transthoracic echocardiogram was performed today and notable for the presence of severe aortic insufficiency.  Cardiothoracic surgical consultation was requested.  Patient is divorced and lives with his elderly father in [place], [place].  The patient has been retired for approximately 6 years.  He cares for his elderly father who is not in good health.  He does not exercise on a regular basis but he reports no specific physical limitations prior to his acute illness.  Up until recently he has been tending to normal chores around the house and mowing the lawn.  He describes a long history of exertional shortness of breath that has slowly progressed.  It has gotten particularly worse over the last few weeks.  The patient has poor appetite.  He denies any history of fevers, chills, or night sweats.  He denies any migratory arthritis or arthralgias.  He does have some chronic pain in his back.  At the time of hospital admission he had resting shortness of breath, orthopnea, and lower extremity edema.  He still gets breathless with conversation and  very mild activity.  He denies any chest pain or chest tightness either with activity or at rest.  He has not had dizzy spells, palpitations, nor syncope.  Past Medical History:  Diagnosis Date  . Anxiety   . Arthritis    knees, neck, shoulder  . BPH (benign prostatic hyperplasia)   . CHF (congestive heart failure) ([place])   . Elevated PSA    denies per patient  . HTN  (hypertension)   . Hyperplasia of prostate with lower urinary tract symptoms (LUTS)   . Hypertension   . 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 Wears hearing aid    bilateral    Past Surgical History:  Procedure Laterality Date  . CATARACT EXTRACTION  04/2018  . COLONOSCOPY  2007  . NASAL SINUS SURGERY  2006  . TRANSANAL HEMORRHOIDAL DEARTERIALIZATION N/A 11/15/2015   Procedure: TRANSANAL HEMORRHOIDAL DEARTERIALIZATION;  Surgeon: [place] Ruff, MD;  Location: [facility];  Service: General;  Laterality: N/A;    Family History  Problem Relation Age of Onset  . COPD Mother   . Heart attack Mother   . Colon polyps Father   . Prostate cancer Father   . Parkinson's disease Father   . Hypertension Sister   . Hypertension Brother   . Breast cancer Sister   . Prostate cancer Paternal Uncle     Social History   Socioeconomic History  . Marital status: Unknown    Spouse name: Not on file  . Number of children: 2  . Years of education: Not on file  . Highest education level: Not on file  Occupational History  . Occupation: retired    Comment: Presenter, broadcasting  Social Needs  . Financial resource strain: Not on file  . Food insecurity:    Worry: Not on file    Inability: Not on file  . Transportation needs:    Medical: Not on file    Non-medical: Not on file  Tobacco Use  . Smoking status: Current Every Day Smoker    Packs/day: 0.50    Years: 50.00    Pack years: 25.00    Types: Cigarettes  . Smokeless tobacco: Never Used  Substance and Sexual Activity  . Alcohol use: No    Alcohol/week: 0.0 standard drinks  . Drug use: No  . Sexual activity: Not Currently  Lifestyle  . Physical activity:    Days per week: Not on file    Minutes per session: Not on file  . Stress: Not on file  Relationships  . Social connections:    Talks on phone: Not on file    Gets together: Not on file    Attends religious service: Not on file    Active member of  club or organization: Not on file    Attends meetings of clubs or organizations: Not on file    Relationship status: Not on file  . Intimate partner violence:    Fear of current or ex partner: Not on file    Emotionally abused: Not on file    Physically abused: Not on file    Forced sexual activity: Not on file  Other Topics Concern  . Not on file  Social History Narrative   Divorced lives with father after incarceration x 5 yrs   1 son, 1 daughter   Volunteer work   3 caffeine/day   04/19/2015    Prior to Admission medications   Medication Sig Start Date End Date Taking? Authorizing Provider  acetaminophen (TYLENOL) 500 MG tablet Take 500 mg by mouth 2 (two) times daily.   Yes [provider]  albuterol (PROVENTIL HFA;VENTOLIN HFA) 108 (90 BASE) MCG/ACT inhaler Inhale 2 puffs into the lungs every 6 (six) hours as needed for wheezing or shortness of breath. 02/01/15  Yes Guest, Benn Moulder, MD  Ascorbic Acid (VITAMIN C) 1000 MG tablet Take 1,000 mg by mouth daily.   Yes [provider]  Azelastine HCl 0.15 % SOLN Place 2 sprays into both nostrils daily as needed (allergies).  03/03/18  Yes [provider]  BIOTIN PO Take 1 tablet by mouth daily.    Yes [provider]  buPROPion (WELLBUTRIN XL) 150 MG 24 hr tablet Take 150 mg daily by mouth. 05/28/17  Yes [provider]  CALCIUM PO Take 1 tablet by mouth daily.    Yes [provider]  carvedilol (COREG) 3.125 MG tablet Take 3.125 mg by mouth 2 (two) times daily with a meal.  04/16/18  Yes [provider]  clonazePAM (KLONOPIN) 1 MG tablet Take 1 mg by mouth 3 (three) times daily as needed for anxiety.   Yes [provider]  co-enzyme Q-10 30 MG capsule Take 30 mg by mouth 2 (two) times daily.   Yes [provider]  DULoxetine (CYMBALTA) 60 MG capsule Take 60 mg daily by mouth. 06/02/17  Yes [provider]  DUREZOL 0.05 % EMUL Place 1 drop into the right eye  4 (four) times daily.  04/06/18  Yes [provider]  lisinopril (PRINIVIL,ZESTRIL) 30 MG tablet Take 30 mg daily by mouth. 05/15/17  Yes [provider]  montelukast (SINGULAIR) 10 MG tablet Take 10 mg daily by mouth. 05/28/17  Yes [provider]  moxifloxacin (VIGAMOX) 0.5 % ophthalmic solution Place 1 drop into the right eye 4 (four) times daily.  04/06/18  Yes [provider]  polyethylene glycol powder (GLYCOLAX/MIRALAX) powder Take 17 g by mouth as needed for mild constipation.    Yes [provider]  primidone (MYSOLINE) 50 MG tablet TAKE 1 TABLET BY MOUTH AT  BEDTIME Patient taking differently: Take 50 mg by mouth at bedtime.  03/09/18  Yes Tat, Eustace Quail, DO  QUEtiapine (SEROQUEL) 50 MG tablet Take 25 mg by mouth at bedtime. 01/15/18  Yes [provider]  sodium chloride (OCEAN) 0.65 % SOLN nasal spray Place 1-2 sprays into both nostrils as needed for congestion.   Yes [provider]  tamsulosin (FLOMAX) 0.4 MG CAPS capsule Take 0.4 mg daily by mouth.   Yes [provider]  tiZANidine (ZANAFLEX) 4 MG tablet Take 4 mg by mouth at bedtime.  05/16/17  Yes [provider]    Current Facility-Administered Medications  Medication Dose Route Frequency Provider Last Rate Last Dose  . 0.9 %  sodium chloride infusion  250 mL Intravenous PRN Purohit, Shrey C, MD      . acetaminophen (TYLENOL) tablet 650 mg  650 mg Oral Q4H PRN Purohit, Shrey C, MD   650 mg at 04/21/18 1435  . buPROPion (WELLBUTRIN XL) 24 hr tablet 150 mg  150 mg Oral Daily Purohit, Shrey C, MD   150 mg at 04/22/18 2301  . carvedilol (COREG) tablet 3.125 mg  3.125 mg Oral BID WC Purohit, Shrey C, MD   3.125 mg at 04/23/18 0618  . clonazePAM (KLONOPIN) tablet 1 mg  1 mg Oral TID PRN Purohit, Konrad Dolores, MD   1 mg at 04/21/18 1435  .  DULoxetine (CYMBALTA) DR capsule 60 mg  60 mg Oral Daily Purohit, Shrey C, MD   60 mg at 04/23/18 0824  . enoxaparin (LOVENOX)  injection 40 mg  40 mg Subcutaneous Q24H Purohit, Shrey C, MD   40 mg at 04/23/18 0826  . feeding supplement (ENSURE ENLIVE) (ENSURE ENLIVE) liquid 237 mL  237 mL Oral Q24H Regalado, Belkys A, MD   237 mL at 04/23/18 1459  . furosemide (LASIX) injection 40 mg  40 mg Intravenous BID Caren Griffins, MD   40 mg at 04/23/18 1109  . gatifloxacin (ZYMAXID) 0.5 % ophthalmic drops 1 drop  1 drop Right Eye QID Regalado, Belkys A, MD   1 drop at 04/23/18 1353  . guaiFENesin (ROBITUSSIN) 100 MG/5ML solution 100 mg  5 mL Oral Q4H PRN Purohit, Shrey C, MD   100 mg at 04/23/18 0701  . losartan (COZAAR) tablet 50 mg  50 mg Oral Daily Purohit, Shrey C, MD   50 mg at 04/23/18 0826  . nicotine (NICODERM CQ - dosed in mg/24 hours) patch 14 mg  14 mg Transdermal Daily Regalado, Belkys A, MD   14 mg at 04/23/18 0828  . ondansetron (ZOFRAN) injection 4 mg  4 mg Intravenous Q6H PRN Purohit, Shrey C, MD      . phenol (CHLORASEPTIC) mouth spray 1 spray  1 spray Mouth/Throat PRN Gherghe, Costin M, MD      . polyethylene glycol (MIRALAX / GLYCOLAX) packet 17 g  17 g Oral BID Regalado, Belkys A, MD   17 g at 04/22/18 7209  . prednisoLONE acetate (PRED FORTE) 1 % ophthalmic suspension 1 drop  1 drop Right Eye QID Regalado, Belkys A, MD   1 drop at 04/23/18 1353  . primidone (MYSOLINE) tablet 50 mg  50 mg Oral QHS Purohit, Shrey C, MD   50 mg at 04/22/18 2300  . QUEtiapine (SEROQUEL) tablet 25 mg  25 mg Oral QHS Purohit, Shrey C, MD   25 mg at 04/22/18 2300  . senna (SENOKOT) tablet 8.6 mg  1 tablet Oral Daily Regalado, Belkys A, MD   8.6 mg at 04/22/18 0820  . sodium chloride (OCEAN) 0.65 % nasal spray 1 spray  1 spray Each Nare PRN Regalado, Belkys A, MD   1 spray at 04/21/18 2254  . sodium chloride flush (NS) 0.9 % injection 3 mL  3 mL Intravenous Q12H Purohit, Shrey C, MD   3 mL at 04/23/18 0828  . sodium chloride flush (NS) 0.9 % injection 3 mL  3 mL Intravenous PRN Purohit, Shrey C, MD      . tamsulosin (FLOMAX) capsule  0.4 mg  0.4 mg Oral QPC supper Purohit, Shrey C, MD   0.4 mg at 04/22/18 1714  . tiZANidine (ZANAFLEX) tablet 4 mg  4 mg Oral QHS Purohit, Shrey C, MD   4 mg at 04/22/18 2300  . vitamin C (ASCORBIC ACID) tablet 1,000 mg  1,000 mg Oral Daily Purohit, Shrey C, MD   1,000 mg at 04/23/18 0827    No Known Allergies    Review of Systems:   General:  decreased appetite, decreased energy, + weight gain, + weight loss, no fever  Cardiac:  no chest pain with exertion, no chest pain at rest, no SOB with exertion, no resting SOB, + PND, + orthopnea, no palpitations, no arrhythmia, no atrial fibrillation, no LE edema, no dizzy spells, no syncope  Respiratory:  + shortness of breath, no home oxygen, + productive cough, +  dry cough, no bronchitis, + wheezing, no hemoptysis, no asthma, no pain with inspiration or cough, no sleep apnea, no CPAP at night  GI:   no difficulty swallowing, no reflux, no frequent heartburn, no hiatal hernia, no abdominal pain, no constipation, no diarrhea, no hematochezia, no hematemesis, no melena  GU:   no dysuria,  no frequency, no urinary tract infection, no hematuria, + enlarged prostate, no kidney stones, no kidney disease  Vascular:  no pain suggestive of claudication, no pain in feet, no leg cramps, no varicose veins, no DVT, no non-healing foot ulcer  Neuro:   no stroke, no TIA's, no seizures, no headaches, no temporary blindness one eye,  no slurred speech, no peripheral neuropathy, no chronic pain, no instability of gait, no memory/cognitive dysfunction  Musculoskeletal: + arthritis - primarily involving the lower back, no joint swelling, no myalgias, no difficulty walking, normal mobility   Skin:   no rash, no itching, no skin infections, no pressure sores or ulcerations  Psych:   no anxiety, no depression, no nervousness, no unusual recent stress  Eyes:   no blurry vision, no floaters, no recent vision changes, does not wears glasses or contacts  ENT:   no hearing loss,  no loose or painful teeth, no dentures, last saw dentist within the past year  Hematologic:  no easy bruising, no abnormal bleeding, no clotting disorder, no frequent epistaxis  Endocrine:  no diabetes, does not check CBG's at home     Physical Exam:   BP (!) 143/59 (BP Location: Left Arm)   Pulse 88   Temp 98.3 F (36.8 C) (Oral)   Resp 20   Ht 6\' 1"  (1.854 m)   Wt 93.7 kg Comment: scale C  SpO2 99%   BMI 27.24 kg/m   General:  Mildly obese,  well-appearing but dyspneic  HEENT:  Unremarkable   Neck:   no JVD, no bruits, no adenopathy   Chest:   clear to auscultation, symmetrical breath sounds, no wheezes, no rhonchi   CV:   RRR, grade III/VI holodiastolic murmur   Abdomen:  soft, non-tender, no masses   Extremities:  warm, well-perfused, pulses diminished, trace lower extremity edema  Rectal/GU  Deferred  Neuro:   Grossly non-focal and symmetrical throughout  Skin:   Clean and dry, no rashes, no breakdown  Diagnostic Tests:  Lab Results: Recent Labs    04/22/18 0409 04/23/18 0507  WBC 9.0 7.5  HGB 14.0 13.6  HCT 40.0 39.0  PLT 224 200   BMET:  Recent Labs    04/22/18 0409 04/23/18 0507  NA 130* 128*  K 3.9 5.6*  CL 94* 89*  CO2 28 32  GLUCOSE 100* 100*  BUN 22 17  CREATININE 0.84 0.83  CALCIUM 8.5* 8.7*    CBG (last 3)  No results for input(s): GLUCAP in the last 72 hours. PT/INR:  No results for input(s): LABPROT, INR in the last 72 hours.  CXR:  CHEST - 2 VIEW  COMPARISON:  04/12/2018 chest radiograph and CT  FINDINGS: Cardiomegaly, interstitial opacities and small bilateral pleural effusions again noted.  No pneumothorax.  No acute bony abnormality.  IMPRESSION: Cardiomegaly with interstitial pulmonary edema and small bilateral pleural effusions.   Electronically Signed   By: Margarette Canada M.D.   On: 04/19/2018 11:49   Transthoracic Echocardiography  Patient:    Catrell, Morrone MR #:       [id number] Study Date:  04/22/2018 Gender:     M Age:  71 Height:     185.4 cm Weight:     92.7 kg BSA:        2.2 m^2 Pt. Status: Room:       [place], [place]  PERFORMING   [facility], Inpatient  ORDERING     Harrell Gave, Bridgette  REFERRING    Harrell Gave, Bridgette  ADMITTING    Purohit, Shrey C  SONOGRAPHER  Haroldine Laws  cc:  ------------------------------------------------------------------- LV EF: 50% -   55%  ------------------------------------------------------------------- Indications:      Aortic insufficiency 424.1.  ------------------------------------------------------------------- History:   PMH:   Congestive heart failure.  Congestive heart failure.  Risk factors:  Hypertension.  ------------------------------------------------------------------- Study Conclusions  - Left ventricle: The cavity size was normal. Wall thickness was   increased in a pattern of mild LVH. Systolic function was normal.   The estimated ejection fraction was in the range of 50% to 55%.   Diffuse hypokinesis. The study is not technically sufficient to   allow evaluation of LV diastolic function. - Aortic valve: There was severe regurgitation. - Left atrium: Volume/bsa, ES (1-plane Simpson&'s, A4C): 34.3   ml/m^2.  ------------------------------------------------------------------- Study data:  No prior study was available for comparison.  Study status:  Routine.  Procedure:  The patient reported no pain pre or post test. Transthoracic echocardiography. Image quality was adequate. The study was technically difficult, as a result of poor acoustic windows.  Study completion:  There were no complications.         Transthoracic echocardiography.  M-mode, complete 2D, spectral Doppler, and color Doppler.  Birthdate:  Patient birthdate: March 07, 1946.  Age:  Patient is 72 yr old.  Sex:  Gender: male.    BMI: 27 kg/m^2.  Blood pressure:     110/40  Patient status:  Inpatient.   Study date:  Study date: 04/22/2018. Study time: 12:53 PM.  Location:  Bedside.  -------------------------------------------------------------------  ------------------------------------------------------------------- Left ventricle:  The cavity size was normal. Wall thickness was increased in a pattern of mild LVH. Systolic function was normal. The estimated ejection fraction was in the range of 50% to 55%. Diffuse hypokinesis. The study is not technically sufficient to allow evaluation of LV diastolic function.  ------------------------------------------------------------------- Aortic valve:   Trileaflet; normal thickness leaflets. Mobility was not restricted.  Doppler:  Transvalvular velocity was within the normal range. There was no stenosis. There was severe regurgitation.  ------------------------------------------------------------------- Aorta:  Aortic root: The aortic root was normal in size.  ------------------------------------------------------------------- Mitral valve:   Structurally normal valve.   Mobility was not restricted.  Doppler:  Transvalvular velocity was within the normal range. There was no evidence for stenosis. There was no regurgitation.  ------------------------------------------------------------------- Left atrium:  The atrium was at the upper limits of normal in size.   ------------------------------------------------------------------- Right ventricle:  The cavity size was normal. Wall thickness was normal. Systolic function was normal.  ------------------------------------------------------------------- Pulmonic valve:   Poorly visualized.  Structurally normal valve. Cusp separation was normal.  Doppler:  Transvalvular velocity was within the normal range. There was no evidence for stenosis. There was no regurgitation.  ------------------------------------------------------------------- Tricuspid valve:   Structurally normal valve.     Doppler: Transvalvular velocity was within the normal range. There was no regurgitation.  ------------------------------------------------------------------- Pulmonary artery:   The main pulmonary artery was normal-sized. Systolic pressure was within the normal range.  ------------------------------------------------------------------- Right atrium:  The atrium was normal in size.  ------------------------------------------------------------------- Pericardium:  There was no pericardial effusion.  ------------------------------------------------------------------- Systemic  veins: Inferior vena cava: The vessel was dilated. The respirophasic diameter changes were blunted (< 50%), consistent with elevated central venous pressure.  ------------------------------------------------------------------- Measurements   Left ventricle                           Value        Reference  LV ID, ED, PLAX chordal                  49.5  mm     43 - 52  LV ID, ES, PLAX chordal                  36.2  mm     23 - 38  LV fx shortening, PLAX chordal   (L)     27    %      >=29  LV PW thickness, ED                      12.4  mm     ----------  IVS/LV PW ratio, ED                      1.05         <=1.3  Stroke volume, 2D                        95    ml     ----------  Stroke volume/bsa, 2D                    43    ml/m^2 ----------  LV e&', lateral                           6.64  cm/s   ----------  LV e&', medial                            10.1  cm/s   ----------  LV e&', average                           8.37  cm/s   ----------    Ventricular septum                       Value        Reference  IVS thickness, ED                        13    mm     ----------    LVOT                                     Value        Reference  LVOT ID, S                               23    mm     ----------  LVOT area                                4.15  cm^2   ----------  LVOT mean velocity, S                     78.7  cm/s   ----------  LVOT VTI, S                              23    cm     ----------    Aortic valve                             Value        Reference  Aortic regurg pressure half-time         117   ms     ----------    Aorta                                    Value        Reference  Aortic root ID, ED                       35    mm     ----------    Left atrium                              Value        Reference  LA ID, A-P, ES                           34    mm     ----------  LA ID/bsa, A-P                           1.55  cm/m^2 <=2.2  LA volume, S                             68.2  ml     ----------  LA volume/bsa, S                         31    ml/m^2 ----------  LA volume, ES, 1-p A4C                   75.4  ml     ----------  LA volume/bsa, ES, 1-p A4C               34.3  ml/m^2 ----------  LA volume, ES, 1-p A2C                   58.4  ml     ----------  LA volume/bsa, ES, 1-p A2C               26.6  ml/m^2 ----------    Right atrium                             Value        Reference  RA ID, S-I, ES, A4C              (H)     51.6  mm  34 - 49  RA area, ES, A4C                         18.9  cm^2   8.3 - 19.5  RA volume, ES, A/L                       55    ml     ----------  RA volume/bsa, ES, A/L                   25    ml/m^2 ----------    Systemic veins                           Value        Reference  Estimated CVP                            8     mm Hg  ----------    Right ventricle                          Value        Reference  TAPSE                                    18.6  mm     ----------  RV s&', lateral, S                        13.8  cm/s   ----------  Legend: (L)  and  (H)  mark values outside specified reference range.  ------------------------------------------------------------------- Prepared and Electronically Authenticated by  Candee Furbish, M.D. 2019-08-21T14:59:18   Impression:  Patient has stage D severe symptomatic aortic  insufficiency with likely acute on chronic diastolic congestive heart failure.  I have personally reviewed the patient's transthoracic echocardiogram.  There is essentially wide open aortic insufficiency.  I disagree with the official interpretation regarding ejection fraction and LV size.  I feel there is should be some concern regarding left ventricular function and the possibility that the patient's severe aortic insufficiency is relatively acute onset.  Images closer to the apex of the heart reveal dilatation of the left ventricular chamber and some degree of left ventricular systolic dysfunction.  The etiology of the patient's aortic insufficiency remains unclear but the patient will clearly need surgical intervention.   Plan:  I discussed the nature of the patient's problem at length with the patient and his daughter at the bedside.  We discussed the need for further diagnostic work-up to include TEE and left and right heart catheterization.  TEE will be necessary to further evaluate the functional anatomy of the aortic valve and rule out the presence of vegetations.  It is possible that catheterization should be postponed until the patient's congestive heart failure has been treated more aggressively since the patient's weight is up 5 pounds since the day of admission.  Lasix has been ordered today and the patient is starting to put out some urine.  We will obtain a CT angiogram of the aorta tonight to make sure the original cause of aortic insufficiency is not related to aortic pathology.  Will also check 2 sets of blood cultures for completeness.  We  will continue to follow.   I spent in excess of 120 minutes during the conduct of this hospital consultation and >50% of this time involved direct face-to-face encounter for counseling and/or coordination of the patient's care.    Valentina Gu. Roxy Manns, MD 04/23/2018 5:06 PM

## 2018-04-23 NOTE — Progress Notes (Signed)
RN notified CT that pt is ready for transport. CT states they will send for pt.

## 2018-04-23 NOTE — Plan of Care (Signed)
  Problem: Education: Goal: Knowledge of General Education information will improve Description: Including pain rating scale, medication(s)/side effects and non-pharmacologic comfort measures Outcome: Progressing   Problem: Health Behavior/Discharge Planning: Goal: Ability to manage health-related needs will improve Outcome: Progressing   Problem: Clinical Measurements: Goal: Ability to maintain clinical measurements within normal limits will improve Outcome: Progressing   Problem: Activity: Goal: Risk for activity intolerance will decrease Outcome: Progressing   Problem: Coping: Goal: Level of anxiety will decrease Outcome: Progressing   Problem: Elimination: Goal: Will not experience complications related to bowel motility Outcome: Progressing   Problem: Pain Managment: Goal: General experience of comfort will improve Outcome: Progressing   Problem: Safety: Goal: Ability to remain free from injury will improve Outcome: Progressing   

## 2018-04-23 NOTE — Progress Notes (Signed)
Brief cardiology follow up note.  Echo reviewed below: Study Conclusions - Left ventricle: The cavity size was normal. Wall thickness was   increased in a pattern of mild LVH. Systolic function was normal.   The estimated ejection fraction was in the range of 50% to 55%.   Diffuse hypokinesis. The study is not technically sufficient to   allow evaluation of LV diastolic function. - Aortic valve: There was severe regurgitation. - Left atrium: Volume/bsa, ES (1-plane Simpson&'s, A4C): 34.3   ml/m^2.  CT surgery has been consulted. Based on their recommendations, we would be happy to schedule cath and/or TEE if necessary for surgical planning. We will follow peripherally, but please contact us if his clinical situation changes or if there are any additional questions.  Buford Dresser, MD, PhD [facility]  [address] [place], [place] [address] [phone]

## 2018-04-24 ENCOUNTER — Other Ambulatory Visit: Payer: Self-pay | Admitting: *Deleted

## 2018-04-24 ENCOUNTER — Inpatient Hospital Stay (HOSPITAL_COMMUNITY): Payer: Medicare Other

## 2018-04-24 DIAGNOSIS — I351 Nonrheumatic aortic (valve) insufficiency: Secondary | ICD-10-CM

## 2018-04-24 DIAGNOSIS — R0600 Dyspnea, unspecified: Secondary | ICD-10-CM

## 2018-04-24 LAB — PULMONARY FUNCTION TEST
DL/VA % pred: 41 %
DL/VA: 2.02 ml/min/mmHg/L
DLCO cor % pred: 21 %
DLCO cor: 8.18 ml/min/mmHg
DLCO unc % pred: 21 %
DLCO unc: 7.94 ml/min/mmHg
FEF 25-75 Post: 0.54 L/sec
FEF 25-75 Pre: 0.58 L/sec
FEF2575-%Change-Post: -7 %
FEF2575-%Pred-Post: 18 %
FEF2575-%Pred-Pre: 20 %
FEV1-%Change-Post: -4 %
FEV1-%Pred-Post: 36 %
FEV1-%Pred-Pre: 38 %
FEV1-Post: 1.39 L
FEV1-Pre: 1.46 L
FEV1FVC-%Change-Post: -4 %
FEV1FVC-%Pred-Pre: 71 %
FEV6-%Change-Post: 0 %
FEV6-%Pred-Post: 51 %
FEV6-%Pred-Pre: 51 %
FEV6-Post: 2.5 L
FEV6-Pre: 2.51 L
FEV6FVC-%Change-Post: 0 %
FEV6FVC-%Pred-Post: 95 %
FEV6FVC-%Pred-Pre: 96 %
FVC-%Change-Post: 0 %
FVC-%Pred-Post: 53 %
FVC-%Pred-Pre: 53 %
FVC-Post: 2.76 L
FVC-Pre: 2.77 L
Post FEV1/FVC ratio: 50 %
Post FEV6/FVC ratio: 91 %
Pre FEV1/FVC ratio: 53 %
Pre FEV6/FVC Ratio: 91 %
RV % pred: 126 %
RV: 3.37 L
TLC % pred: 78 %
TLC: 6.15 L

## 2018-04-24 LAB — BASIC METABOLIC PANEL
Anion gap: 12 (ref 5–15)
BUN: 14 mg/dL (ref 8–23)
CO2: 30 mmol/L (ref 22–32)
Calcium: 9.2 mg/dL (ref 8.9–10.3)
Chloride: 87 mmol/L — ABNORMAL LOW (ref 98–111)
Creatinine, Ser: 0.86 mg/dL (ref 0.61–1.24)
GFR calc Af Amer: >60 mL/min (ref >60)
GFR calc non Af Amer: >60 mL/min (ref >60)
Glucose, Bld: 138 mg/dL — ABNORMAL HIGH (ref 70–99)
Potassium: 3.8 mmol/L (ref 3.5–5.1)
Sodium: 129 mmol/L — ABNORMAL LOW (ref 135–145)

## 2018-04-24 LAB — MAGNESIUM: Magnesium: 1.9 mg/dL (ref 1.7–2.4)

## 2018-04-24 MED ORDER — POLYETHYLENE GLYCOL 3350 17 G PO PACK
17.0000 g | PACK | Freq: Two times a day (BID) | ORAL | Status: DC
  Administered 2018-04-25 – 2018-04-29 (×4): 17 g via ORAL
  Filled 2018-04-24 (×9): qty 1

## 2018-04-24 MED ORDER — FUROSEMIDE 10 MG/ML IJ SOLN
60.0000 mg | Freq: Two times a day (BID) | INTRAMUSCULAR | Status: DC
  Administered 2018-04-24 – 2018-04-26 (×5): 60 mg via INTRAVENOUS
  Filled 2018-04-24 (×5): qty 6

## 2018-04-24 MED ORDER — ALBUTEROL SULFATE (2.5 MG/3ML) 0.083% IN NEBU
2.5000 mg | INHALATION_SOLUTION | Freq: Once | RESPIRATORY_TRACT | Status: AC
  Administered 2018-04-24: 2.5 mg via RESPIRATORY_TRACT

## 2018-04-24 NOTE — Plan of Care (Signed)

## 2018-04-24 NOTE — Progress Notes (Signed)
PROGRESS NOTE  Travis Reese KVQ:[id number] DOB: Jan 11, 1946 DOA: 04/19/2018 PCP: Janine Limbo, PA-C   LOS: 5 days   Brief Narrative / Interim history: Travis Medina Flowersis a 72 y.o.malewith medical history significant ofCAD, hypertension, BPH who comes in with shortness of breath, dyspnea on exertion. Patient reports that for the past 2 to 3 weeks he has been having increasing dyspnea on exertion and shortness of breath. He has noted some trace lower extremity edema and possibly some orthopnea but no clear symptoms. He was admitted from 04/12/2018 to 04/14/2018 at [facility] with shortness of breath, records reviewed and he responded to diuresis, had a 2D echo which was found to have severe aortic regurgitation and plans were for patient to have outpatient referral to cardiothoracic surgery.  He was discharged in improved condition but over the few days of being home he continued to be progressively short of breath and was admitted to the hospital again.   Assessment & Plan: Principal Problem:   Severe aortic insufficiency Active Problems:   HTN (hypertension)   Acute CHF (congestive heart failure) (HCC)   BPH (benign prostatic hyperplasia)   Anxiety   Acute hypoxic respiratory failure due to pulmonary edema in the setting of severe aortic regurgitation -2D echo done 8/21 showed severe aortic regurgitation -Cardiology and cardiothoracic surgery consulted, plans for TEE/right and left heart cath followed at one point by surgery -Continue Coreg, Cozaar -Continue IV Lasix, monitor I's and O's, daily weights -Respiratory status stable however still hypoxic and quite short of breath with minimal activities  Hyponatremia -Continue IV Lasix, sodium stable, likely in the setting of fluid overload.   BPH  -Continue Flomax  Hypertension -Continue Coreg, Cozaar, IV Lasix  Depression/anxiety -Continue bupropion, clonazepam, Seroquel, primidone and duloxetine  Tobacco  abuse -Nicotine patches   DVT prophylaxis: Lovenox Code Status: Full code Family Communication: no family at bedside Disposition Plan: home when ready   Consultants:   Cardiology   Cardiothoracic surgery   Procedures:   2D echo Study Conclusions - Left ventricle: The cavity size was normal. Wall thickness was increased in a pattern of mild LVH. Systolic function was normal. The estimated ejection fraction was in the range of 50% to 55%. Diffuse hypokinesis. The study is not technically sufficient to allow evaluation of LV diastolic function. - Aortic valve: There was severe regurgitation. - Left atrium: Volume/bsa, ES (1-plane Simpson&'s, A4C): 34.3 ml/m^2.  Antimicrobials:  None    Subjective: -Feeling about the same, still dyspneic with ambulation but okay at rest  Objective: Vitals:   04/23/18 1309 04/23/18 1743 04/23/18 2107 04/24/18 0457  BP: (!) 143/59 (!) 144/62 (!) 130/53 (!) 138/55  Pulse: 88 86 89 89  Resp: 20  (!) 22 18  Temp: 98.3 F (36.8 C)  99.2 F (37.3 C) 98.4 F (36.9 C)  TempSrc: Oral  Oral Oral  SpO2: 99%  98% 98%  Weight:    91.4 kg  Height:        Intake/Output Summary (Last 24 hours) at 04/24/2018 1233 Last data filed at 04/24/2018 0951 Gross per 24 hour  Intake 480 ml  Output 3475 ml  Net -2995 ml   Filed Weights   04/21/18 0534 04/23/18 0757 04/24/18 0457  Weight: 92.7 kg 93.7 kg 91.4 kg    Examination:  Constitutional: NAD Eyes: No scleral icterus Respiratory: Overall clear with faint bibasilar crackles.  No wheezing heard Cardiovascular: Regular rate and rhythm, no murmurs heard. Abdomen: Soft, nontender, nondistended, positive bowel sounds Skin:  No rashes seen Neurologic: Ambulatory, equal strength,   Data Reviewed: I have independently reviewed following labs and imaging studies   CBC: Recent Labs  Lab 04/19/18 1233 04/22/18 0409 04/23/18 0507  WBC 8.2 9.0 7.5  NEUTROABS 7.1  --   --   HGB 14.2 14.0 13.6  HCT  40.7 40.0 39.0  MCV 100.2* 101.0* 101.3*  PLT 187 224 578   Basic Metabolic Panel: Recent Labs  Lab 04/20/18 0637 04/21/18 0423 04/22/18 0409 04/23/18 0507 04/24/18 0542  NA 134* 131* 130* 128* 129*  K 3.9 3.5 3.9 5.6* 3.8  CL 95* 94* 94* 89* 87*  CO2 28 29 28  32 30  GLUCOSE 107* 110* 100* 100* 138*  BUN 17 23 22 17 14   CREATININE 0.99 0.94 0.84 0.83 0.86  CALCIUM 9.2 8.7* 8.5* 8.7* 9.2  MG 1.8  --   --   --   --    GFR: Estimated Creatinine Clearance: 91.6 mL/min (by C-G formula based on SCr of 0.86 mg/dL). Liver Function Tests: Recent Labs  Lab 04/19/18 1233  AST 26  ALT 22  ALKPHOS 61  BILITOT 1.3*  PROT 6.5  ALBUMIN 3.4*   No results for input(s): LIPASE, AMYLASE in the last 168 hours. No results for input(s): AMMONIA in the last 168 hours. Coagulation Profile: No results for input(s): INR, PROTIME in the last 168 hours. Cardiac Enzymes: No results for input(s): CKTOTAL, CKMB, CKMBINDEX, TROPONINI in the last 168 hours. BNP (last 3 results) No results for input(s): PROBNP in the last 8760 hours. HbA1C: No results for input(s): HGBA1C in the last 72 hours. CBG: No results for input(s): GLUCAP in the last 168 hours. Lipid Profile: No results for input(s): CHOL, HDL, LDLCALC, TRIG, CHOLHDL, LDLDIRECT in the last 72 hours. Thyroid Function Tests: No results for input(s): TSH, T4TOTAL, FREET4, T3FREE, THYROIDAB in the last 72 hours. Anemia Panel: No results for input(s): VITAMINB12, FOLATE, FERRITIN, TIBC, IRON, RETICCTPCT in the last 72 hours. Urine analysis:    Component Value Date/Time   COLORURINE STRAW (A) 04/19/2018 2159   APPEARANCEUR CLEAR 04/19/2018 2159   LABSPEC 1.004 (L) 04/19/2018 2159   PHURINE 7.0 04/19/2018 2159   GLUCOSEU NEGATIVE 04/19/2018 2159   HGBUR NEGATIVE 04/19/2018 2159   [place] NEGATIVE 04/19/2018 2159   [place] NEGATIVE 04/19/2018 2159   PROTEINUR NEGATIVE 04/19/2018 2159   NITRITE NEGATIVE 04/19/2018 2159    LEUKOCYTESUR NEGATIVE 04/19/2018 2159   Sepsis Labs: Invalid input(s): PROCALCITONIN, LACTICIDVEN  Recent Results (from the past 240 hour(s))  Culture, blood (Routine X 2) w Reflex to ID Panel     Status: None (Preliminary result)   Collection Time: 04/23/18  6:20 PM  Result Value Ref Range Status   Specimen Description BLOOD RIGHT HAND  Final   Special Requests   Final    BOTTLES DRAWN AEROBIC ONLY Blood Culture adequate volume   Culture   Final    NO GROWTH < 24 HOURS Performed at [facility], [address], [place], [place] [address]    Report Status PENDING  Incomplete  Culture, blood (Routine X 2) w Reflex to ID Panel     Status: None (Preliminary result)   Collection Time: 04/23/18  6:25 PM  Result Value Ref Range Status   Specimen Description BLOOD RIGHT ANTECUBITAL  Final   Special Requests   Final    BOTTLES DRAWN AEROBIC ONLY Blood Culture adequate volume   Culture   Final    NO GROWTH <  24 HOURS Performed at [facility], [place] [address], [place], [place] [address]    Report Status PENDING  Incomplete      Radiology Studies: Ct Angio Chest/abd/pel For Dissection W And/or W/wo  Result Date: 04/23/2018 CLINICAL DATA:  Evaluate for aortic dissection. 6-9 month history of progressive fatigue and lower extremity weakness with exertional shortness of breath. EXAM: CT ANGIOGRAPHY CHEST, ABDOMEN AND PELVIS TECHNIQUE: Multidetector CT imaging through the chest, abdomen and pelvis was performed using the standard protocol during bolus administration of intravenous contrast. Multiplanar reconstructed images and MIPs were obtained and reviewed to evaluate the vascular anatomy. CONTRAST:  141mL ISOVUE-370 IOPAMIDOL (ISOVUE-370) INJECTION 76% COMPARISON:  Chest CT 04/12/2018. FINDINGS: CTA CHEST FINDINGS Cardiovascular: Heart is normal size. 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 No evidence of thoracic aortic  dissection. Minimal calcified plaque over the thoracic aorta. Pulmonary arterial system is well opacified without emboli. Mediastinum/Nodes: No significant mediastinal or hilar adenopathy. Remaining mediastinal structures are within normal. Lungs/Pleura: Lungs are adequately inflated and demonstrate mild to moderate centrilobular emphysematous disease. Small amount of bilateral pleural fluid with associated bibasilar atelectatic change. Subtle patchy peripheral density over the anteromedial right middle lobe and lateral right upper lobe which may be due to atelectasis or early infection. No focal lobar consolidation. Mild aspirate material over the distal trachea and right mainstem bronchus. Musculoskeletal: Mild degenerate change of the spine. Review of the MIP images confirms the above findings. CTA ABDOMEN AND PELVIS FINDINGS VASCULAR Aorta: Mild calcified plaque without evidence of aneurysmal dilatation or dissection. Celiac: Minimal calcified plaque without evidence of stenosis or thrombosis. SMA: Within normal. Renals: Normal. IMA: Normal. Inflow: Mild calcified plaque over the iliac arteries which are otherwise patent without focal stenosis, occlusion or thrombosis. Veins: Unremarkable. Review of the MIP images confirms the above findings. NON-VASCULAR Hepatobiliary: Liver, gallbladder and biliary tree are normal. Pancreas: Normal. Spleen: Normal. Adrenals/Urinary Tract: Adrenal glands are normal. Kidneys are normal in size without hydronephrosis or nephrolithiasis. Ureters and bladder are within normal. Stomach/Bowel: Stomach and small bowel are normal. Appendix is normal. There is mild diverticulosis of the colon without active inflammation. Lymphatic: No adenopathy. Reproductive: Normal. Other: No free fluid or focal inflammatory change. Musculoskeletal: Degenerative change of the spine. Mild biphasic curvature of the thoracolumbar spine. Mild degenerate change of the hips. Multilevel disc disease over the  lumbar spine. Review of the MIP images confirms the above findings. IMPRESSION: No evidence of dissection involving the thoracoabdominal aorta. Small bilateral pleural effusions with bibasilar atelectasis slightly improved. Subtle peripheral patchy density over the lateral right upper lobe and anteromedial right middle lobe likely atelectasis, although infection is possible. Aspirate material over the distal trachea and right mainstem bronchus. No acute findings in the abdomen/pelvis. 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 Recommend annual imaging followup by CTA or MRA. This recommendation follows 2010 ACCF/AHA/AATS/ACR/ASA/SCA/SCAI/SIR/STS/SVM Guidelines for the Diagnosis and Management of Patients with Thoracic Aortic Disease. Circulation. 2010; 121: P536-R443. Aortic Atherosclerosis (ICD10-I70.0) and Emphysema (ICD10-J43.9). Colonic diverticulosis. Electronically Signed   By: Marin Olp M.D.   On: 04/23/2018 20:38     Scheduled Meds: . buPROPion  150 mg Oral Daily  . carvedilol  3.125 mg Oral BID WC  . DULoxetine  60 mg Oral Daily  . enoxaparin (LOVENOX) injection  40 mg Subcutaneous Q24H  . feeding supplement (ENSURE ENLIVE)  237 mL Oral Q24H  . furosemide  60 mg Intravenous BID  .  gatifloxacin  1 drop Right Eye QID  . losartan  50 mg Oral Daily  . nicotine  14 mg Transdermal Daily  . polyethylene glycol  17 g Oral BID  . prednisoLONE acetate  1 drop Right Eye QID  . primidone  50 mg Oral QHS  . QUEtiapine  25 mg Oral QHS  . senna  1 tablet Oral Daily  . sodium chloride flush  3 mL Intravenous Q12H  . tamsulosin  0.4 mg Oral QPC supper  . tiZANidine  4 mg Oral QHS  . vitamin C  1,000 mg Oral Daily   Continuous Infusions: . sodium chloride       Marzetta Board, MD, PhD [facility] Pager [phone]  If 7PM-7AM, please contact night-coverage [email or web address] Password [facility] 04/24/2018, 12:33 PM

## 2018-04-24 NOTE — Care Management Note (Signed)
Case Management Note  Patient Details  Name: Travis Reese MRN: [id number] Date of Birth: [birth year]  Subjective/Objective:     Severe AS             Action/Plan: Patient lives at home with his father; PCP: Sheppard Evens; has private insurance with [facility] with prescription drug coverage; CM following for progression of care; pt for TEE/ heart Cath;  Expected Discharge Date:    undetermined at this time              Expected Discharge Plan:  [place]  Discharge planning Services  CM Consult  Status of Service:  In process, will continue to follow  Sherrilyn Rist [phone] 04/24/2018, 3:21 PM

## 2018-04-24 NOTE — Consult Note (Addendum)
Advanced Heart Failure Team Consult Note   Primary Physician: Janine Limbo, PA-C PCP-Cardiologist:  No primary care provider on file.  Reason for Consultation: Heart Failure   HPI:    Travis Reese is seen today for evaluation of heart failure at the request of Dr Roxy Manns .   [name] is a 72 year old with a history of HTN, tobacco abuse, aortic insufficiency, and diastolic heart failure.   He first noticed increased shortness about 1 month ago. Prior to that he was independent and able to walk without difficulty.   Admitted to [facility]12/18 with increased dyspnea. Stress test was negative. ECHO per patient showed leaky valve. He was diuresed with IV lasix and transitioned to oral lasix.   Admitted to [facility] on August 18th with increased SOB. CXR showed pulmonary edema. ECHO was completed and showed severe AI. He has been diuresing with IV lasix. Dr Roxy Manns was consulted with recommendations for TEE, RHC/LHC. Plan for surgery once HF optimized.  Today his weight is down over 4 pounds.   He remains SOB with exertion.  Blood cultures pending.   CTA 8/22  No evidence of dissection involving the thoracoabdominal aorta. Small bilateral pleural effusions with bibasilar atelectasis slightly improved. Subtle peripheral patchy density over the lateral right upper lobe and anteromedial right middle lobe likely atelectasis, although infection is possible. Aspirate material over the distal trachea and right mainstem bronchus. No acute findings in the abdomen/pelvis. 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 Recommend annual imaging followup by CTA or MRA. This recommendation follows 2010 ACCF/AHA/AATS/ACR/ASA/SCA/SCAI/SIR/STS/SVM Guidelines for the Diagnosis and Management of Patients with Thoracic Aortic Disease. Circulation. 2010; 121: X448-J856.  Echo 04/22/2018  Left ventricle: The cavity size was normal. Wall thickness was   increased in a pattern  of mild LVH. Systolic function was normal.   The estimated ejection fraction was in the range of 50% to 55%.   Diffuse hypokinesis. The study is not technically sufficient to   allow evaluation of LV diastolic function. - Aortic valve: There was severe regurgitation. - Left atrium: Volume/bsa, ES (1-plane Simpson&'s, A4C): 34.3   ml/m^2.   Review of Systems: [y] = yes, [ ]  = no   General: Weight gain [ ] ; Weight loss [ ] ; Anorexia [ ] ; Fatigue [Y ]; Fever [ ] ; Chills [ ] ; Weakness [ ]   Cardiac: Chest pain/pressure [ ] ; Resting SOB [Y ]; Exertional SOB [Y ]; Orthopnea [ ] ; Pedal Edema [ ] ; Palpitations [ ] ; Syncope [ ] ; Presyncope [ ] ; Paroxysmal nocturnal dyspnea[ ]   Pulmonary: Cough [ ] ; Wheezing[ Y]; Hemoptysis[ ] ; Sputum [ ] ; Snoring [ ]   GI: Vomiting[ ] ; Dysphagia[ ] ; Melena[ ] ; Hematochezia [ ] ; Heartburn[ ] ; Abdominal pain [ ] ; Constipation [ ] ; Diarrhea [ ] ; BRBPR [ ]   GU: Hematuria[ ] ; Dysuria [ ] ; Nocturia[ ]   Vascular: Pain in legs with walking [ ] ; Pain in feet with lying flat [ ] ; Non-healing sores [ ] ; Stroke [ ] ; TIA [ ] ; Slurred speech [ ] ;  Neuro: Headaches[ ] ; Vertigo[ ] ; Seizures[ ] ; Paresthesias[ ] ;Blurred vision [ ] ; Diplopia [ ] ; Vision changes [ ]   Ortho/Skin: Arthritis [ ] ; Joint pain [ ] ; Muscle pain [ ] ; Joint swelling [ ] ; Back Pain [ ] ; Rash [ ]   Psych: Depression[ ] ; Anxiety[ ]   Heme: Bleeding problems [ ] ; Clotting disorders [ ] ; Anemia [ ]   Endocrine: Diabetes [ ] ; Thyroid dysfunction[ ]   Home Medications Prior to  Admission medications   Medication Sig Start Date End Date Taking? Authorizing Provider  acetaminophen (TYLENOL) 500 MG tablet Take 500 mg by mouth 2 (two) times daily.   Yes [provider]  albuterol (PROVENTIL HFA;VENTOLIN HFA) 108 (90 BASE) MCG/ACT inhaler Inhale 2 puffs into the lungs every 6 (six) hours as needed for wheezing or shortness of breath. 02/01/15  Yes Guest, Benn Moulder, MD  Ascorbic Acid (VITAMIN C) 1000 MG tablet Take 1,000 mg  by mouth daily.   Yes [provider]  Azelastine HCl 0.15 % SOLN Place 2 sprays into both nostrils daily as needed (allergies).  03/03/18  Yes [provider]  BIOTIN PO Take 1 tablet by mouth daily.    Yes [provider]  buPROPion (WELLBUTRIN XL) 150 MG 24 hr tablet Take 150 mg daily by mouth. 05/28/17  Yes [provider]  CALCIUM PO Take 1 tablet by mouth daily.    Yes [provider]  carvedilol (COREG) 3.125 MG tablet Take 3.125 mg by mouth 2 (two) times daily with a meal.  04/16/18  Yes [provider]  clonazePAM (KLONOPIN) 1 MG tablet Take 1 mg by mouth 3 (three) times daily as needed for anxiety.   Yes [provider]  co-enzyme Q-10 30 MG capsule Take 30 mg by mouth 2 (two) times daily.   Yes [provider]  DULoxetine (CYMBALTA) 60 MG capsule Take 60 mg daily by mouth. 06/02/17  Yes [provider]  DUREZOL 0.05 % EMUL Place 1 drop into the right eye 4 (four) times daily.  04/06/18  Yes [provider]  lisinopril (PRINIVIL,ZESTRIL) 30 MG tablet Take 30 mg daily by mouth. 05/15/17  Yes [provider]  montelukast (SINGULAIR) 10 MG tablet Take 10 mg daily by mouth. 05/28/17  Yes [provider]  moxifloxacin (VIGAMOX) 0.5 % ophthalmic solution Place 1 drop into the right eye 4 (four) times daily.  04/06/18  Yes [provider]  polyethylene glycol powder (GLYCOLAX/MIRALAX) powder Take 17 g by mouth as needed for mild constipation.    Yes [provider]  primidone (MYSOLINE) 50 MG tablet TAKE 1 TABLET BY MOUTH AT  BEDTIME Patient taking differently: Take 50 mg by mouth at bedtime.  03/09/18  Yes Tat, Eustace Quail, DO  QUEtiapine (SEROQUEL) 50 MG tablet Take 25 mg by mouth at bedtime. 01/15/18  Yes [provider]  sodium chloride (OCEAN) 0.65 % SOLN nasal spray Place 1-2 sprays into both nostrils as needed for congestion.   Yes [provider]  tamsulosin  (FLOMAX) 0.4 MG CAPS capsule Take 0.4 mg daily by mouth.   Yes [provider]  tiZANidine (ZANAFLEX) 4 MG tablet Take 4 mg by mouth at bedtime.  05/16/17  Yes [provider]    Past Medical History: Past Medical History:  Diagnosis Date  . Anxiety   . Arthritis    knees, neck, shoulder  . BPH (benign prostatic hyperplasia)   . CHF (congestive heart failure) ([place])   . Elevated PSA    denies per patient  . HTN (hypertension)   . Hyperplasia of prostate with lower urinary tract symptoms (LUTS)   . Hypertension   . 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 Wears hearing aid    bilateral    Past Surgical History: Past Surgical History:  Procedure Laterality Date  . CATARACT EXTRACTION  04/2018  . COLONOSCOPY  2007  . NASAL SINUS SURGERY  2006  . TRANSANAL  HEMORRHOIDAL DEARTERIALIZATION N/A 11/15/2015   Procedure: TRANSANAL HEMORRHOIDAL DEARTERIALIZATION;  Surgeon: Leighton Ruff, MD;  Location: [facility];  Service: General;  Laterality: N/A;    Family History: Family History  Problem Relation Age of Onset  . COPD Mother   . Heart attack Mother   . Colon polyps Father   . Prostate cancer Father   . Parkinson's disease Father   . Hypertension Sister   . Hypertension Brother   . Breast cancer Sister   . Prostate cancer Paternal Uncle     Social History: Social History   Socioeconomic History  . Marital status: Unknown    Spouse name: Not on file  . Number of children: 2  . Years of education: Not on file  . Highest education level: Not on file  Occupational History  . Occupation: retired    Comment: Presenter, broadcasting  Social Needs  . Financial resource strain: Not on file  . Food insecurity:    Worry: Not on file    Inability: Not on file  . Transportation needs:    Medical: Not on file    Non-medical: Not on file  Tobacco Use  . Smoking status: Current Every Day Smoker    Packs/day: 0.50    Years: 50.00    Pack  years: 25.00    Types: Cigarettes  . Smokeless tobacco: Never Used  Substance and Sexual Activity  . Alcohol use: No    Alcohol/week: 0.0 standard drinks  . Drug use: No  . Sexual activity: Not Currently  Lifestyle  . Physical activity:    Days per week: Not on file    Minutes per session: Not on file  . Stress: Not on file  Relationships  . Social connections:    Talks on phone: Not on file    Gets together: Not on file    Attends religious service: Not on file    Active member of club or organization: Not on file    Attends meetings of clubs or organizations: Not on file    Relationship status: Not on file  Other Topics Concern  . Not on file  Social History Narrative   Divorced lives with father after incarceration x 5 yrs   1 son, 1 daughter   Volunteer work   3 caffeine/day   04/19/2015    Allergies:  No Known Allergies  Objective:    Vital Signs:   Temp:  [98.3 F (36.8 C)-99.2 F (37.3 C)] 98.4 F (36.9 C) (08/23 0457) Pulse Rate:  [86-89] 89 (08/23 0457) Resp:  [18-22] 18 (08/23 0457) BP: (129-144)/(53-65) 138/55 (08/23 0457) SpO2:  [98 %-99 %] 98 % (08/23 0457) Weight:  [91.4 kg-93.7 kg] 91.4 kg (08/23 0457) Last BM Date: 04/23/18  Weight change: Filed Weights   04/21/18 0534 04/23/18 0757 04/24/18 0457  Weight: 92.7 kg 93.7 kg 91.4 kg    Intake/Output:   Intake/Output Summary (Last 24 hours) at 04/24/2018 0753 Last data filed at 04/24/2018 0600 Gross per 24 hour  Intake 480 ml  Output 4250 ml  Net -3770 ml      Physical Exam    General:  No resp difficulty HEENT: normal Neck: supple. JVP to jaw . Carotids 2+ bilat; no bruits. No lymphadenopathy or thyromegaly appreciated. Cor: PMI nondisplaced. Regular rate & rhythm. No rubs, gallops or murmurs. Lungs: Wheeze/Crackles 1/2 up on r and l . On 1 liter oxygen  Abdomen: soft, nontender, nondistended. No hepatosplenomegaly. No bruits or masses. Good bowel  sounds. Extremities: no cyanosis,  clubbing, rash, 2+ edema Neuro: alert & orientedx3, cranial nerves grossly intact. moves all 4 extremities w/o difficulty. Affect pleasant   Telemetry  NSR   EKG    SR 93 bpm   Labs   Basic Metabolic Panel: Recent Labs  Lab 04/19/18 1233 04/20/18 0637 04/21/18 0423 04/22/18 0409 04/23/18 0507  NA 128* 134* 131* 130* 128*  K 3.9 3.9 3.5 3.9 5.6*  CL 93* 95* 94* 94* 89*  CO2 23 28 29 28  32  GLUCOSE 122* 107* 110* 100* 100*  BUN 17 17 23 22 17   CREATININE 0.81 0.99 0.94 0.84 0.83  CALCIUM 8.9 9.2 8.7* 8.5* 8.7*  MG  --  1.8  --   --   --     Liver Function Tests: Recent Labs  Lab 04/19/18 1233  AST 26  ALT 22  ALKPHOS 61  BILITOT 1.3*  PROT 6.5  ALBUMIN 3.4*   No results for input(s): LIPASE, AMYLASE in the last 168 hours. No results for input(s): AMMONIA in the last 168 hours.  CBC: Recent Labs  Lab 04/19/18 1233 04/22/18 0409 04/23/18 0507  WBC 8.2 9.0 7.5  NEUTROABS 7.1  --   --   HGB 14.2 14.0 13.6  HCT 40.7 40.0 39.0  MCV 100.2* 101.0* 101.3*  PLT 187 224 200    Cardiac Enzymes: No results for input(s): CKTOTAL, CKMB, CKMBINDEX, TROPONINI in the last 168 hours.  BNP: BNP (last 3 results) Recent Labs    04/19/18 1233  BNP 1,549.5*    ProBNP (last 3 results) No results for input(s): PROBNP in the last 8760 hours.   CBG: No results for input(s): GLUCAP in the last 168 hours.  Coagulation Studies: No results for input(s): LABPROT, INR in the last 72 hours.   Imaging   Ct Angio Chest/abd/pel For Dissection W And/or W/wo  Result Date: 04/23/2018 CLINICAL DATA:  Evaluate for aortic dissection. 6-9 month history of progressive fatigue and lower extremity weakness with exertional shortness of breath. EXAM: CT ANGIOGRAPHY CHEST, ABDOMEN AND PELVIS TECHNIQUE: Multidetector CT imaging through the chest, abdomen and pelvis was performed using the standard protocol during bolus administration of intravenous contrast. Multiplanar reconstructed  images and MIPs were obtained and reviewed to evaluate the vascular anatomy. CONTRAST:  160mL ISOVUE-370 IOPAMIDOL (ISOVUE-370) INJECTION 76% COMPARISON:  Chest CT 04/12/2018. FINDINGS: CTA CHEST FINDINGS Cardiovascular: Heart is normal size. 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 No evidence of thoracic aortic dissection. Minimal calcified plaque over the thoracic aorta. Pulmonary arterial system is well opacified without emboli. Mediastinum/Nodes: No significant mediastinal or hilar adenopathy. Remaining mediastinal structures are within normal. Lungs/Pleura: Lungs are adequately inflated and demonstrate mild to moderate centrilobular emphysematous disease. Small amount of bilateral pleural fluid with associated bibasilar atelectatic change. Subtle patchy peripheral density over the anteromedial right middle lobe and lateral right upper lobe which may be due to atelectasis or early infection. No focal lobar consolidation. Mild aspirate material over the distal trachea and right mainstem bronchus. Musculoskeletal: Mild degenerate change of the spine. Review of the MIP images confirms the above findings. CTA ABDOMEN AND PELVIS FINDINGS VASCULAR Aorta: Mild calcified plaque without evidence of aneurysmal dilatation or dissection. Celiac: Minimal calcified plaque without evidence of stenosis or thrombosis. SMA: Within normal. Renals: Normal. IMA: Normal. Inflow: Mild calcified plaque over the iliac arteries which are otherwise patent without focal stenosis, occlusion or thrombosis. Veins: Unremarkable. Review of the MIP  images confirms the above findings. NON-VASCULAR Hepatobiliary: Liver, gallbladder and biliary tree are normal. Pancreas: Normal. Spleen: Normal. Adrenals/Urinary Tract: Adrenal glands are normal. Kidneys are normal in size without hydronephrosis or nephrolithiasis. Ureters and bladder are within normal. Stomach/Bowel: Stomach and small bowel  are normal. Appendix is normal. There is mild diverticulosis of the colon without active inflammation. Lymphatic: No adenopathy. Reproductive: Normal. Other: No free fluid or focal inflammatory change. Musculoskeletal: Degenerative change of the spine. Mild biphasic curvature of the thoracolumbar spine. Mild degenerate change of the hips. Multilevel disc disease over the lumbar spine. Review of the MIP images confirms the above findings. IMPRESSION: No evidence of dissection involving the thoracoabdominal aorta. Small bilateral pleural effusions with bibasilar atelectasis slightly improved. Subtle peripheral patchy density over the lateral right upper lobe and anteromedial right middle lobe likely atelectasis, although infection is possible. Aspirate material over the distal trachea and right mainstem bronchus. No acute findings in the abdomen/pelvis. 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 Recommend annual imaging followup by CTA or MRA. This recommendation follows 2010 ACCF/AHA/AATS/ACR/ASA/SCA/SCAI/SIR/STS/SVM Guidelines for the Diagnosis and Management of Patients with Thoracic Aortic Disease. Circulation. 2010; 121: X937-J696. Aortic Atherosclerosis (ICD10-I70.0) and Emphysema (ICD10-J43.9). Colonic diverticulosis. Electronically Signed   By: Marin Olp M.D.   On: 04/23/2018 20:38      Medications:     Current Medications: . buPROPion  150 mg Oral Daily  . carvedilol  3.125 mg Oral BID WC  . DULoxetine  60 mg Oral Daily  . enoxaparin (LOVENOX) injection  40 mg Subcutaneous Q24H  . feeding supplement (ENSURE ENLIVE)  237 mL Oral Q24H  . furosemide  40 mg Intravenous BID  . gatifloxacin  1 drop Right Eye QID  . losartan  50 mg Oral Daily  . nicotine  14 mg Transdermal Daily  . polyethylene glycol  17 g Oral BID  . prednisoLONE acetate  1 drop Right Eye QID  . primidone  50 mg Oral QHS  . QUEtiapine  25 mg Oral QHS  . senna  1 tablet Oral Daily  . sodium  chloride flush  3 mL Intravenous Q12H  . tamsulosin  0.4 mg Oral QPC supper  . tiZANidine  4 mg Oral QHS  . vitamin C  1,000 mg Oral Daily     Infusions: . sodium chloride         Patient Profile   Mr Cowin is a 72 year old with a history of HTN, tobacco abuse, aortic insufficiency, and diastolic heart failure.   CT consulted for AI. CT requested HF team to optimize prior to surgery.   Assessment/Plan  1. Dyspnea Multifactorial with AI/diastoic hf. CTA with emphysema . Small pleural effusion and patchy density RUL RML. WBC is not elevated.   2. Aortic Insufficiency ECHO with AV severe regurgitation  CT surgery consulted.  Needs TEE and RHC/LHC Blood cultures pending.   3 . A/C Diastolic HF EF 78%. Aortic Valve with severe regurgitation. Volume status elevated. Increase IV lasix to 60 mg twice a day.  Follow BMET daily   4. Smoker   5. Hyperkalemia Yesterday K 5.6 >3.8  BMET pending.   6. Hyponatremia  Sodium 128> 129 Limit free water.    7. Emphysema Noted on CTA  Discussed with Dr Haroldine Laws. Set up TEE/RHC/LHC on Monday.   Medication concerns reviewed with patient and pharmacy team. Barriers identified: none   Length of Stay: [place], NP  04/24/2018, 7:53 AM  Advanced  Heart Failure Team Pager [phone] (M-F; [place])  Please contact [facility] for night-coverage after hours (4p -7a ) and weekends on [email or web address]   Patient seen and examined with Darrick Grinder, NP. We discussed all aspects of the encounter. I agree with the assessment and plan as stated above.   72 y/o male with h/o HTN and COPD. Admitted with acute HF symptoms. Echo reviewed personally. EF normal but has severe AI of unclear etiology. Denies h/o recent infection, IVDA or known endocarditis. Denies CP. Significant volume overload on exam. Responding to IV lasix.   JVP up Carotids hyperdynamic Cor RRR 2/6 AI Lungs + crackles and mild wheezing Ab soft NT-ND Ext warm 2+ edema    Will diurese over the weekend. Plan TEE and R/L cath on Monday in preparation for AVR. BCx remain negative to date. Check tox screen.   Glori Bickers, MD  8:19 PM

## 2018-04-25 ENCOUNTER — Inpatient Hospital Stay (HOSPITAL_COMMUNITY): Payer: Medicare Other

## 2018-04-25 LAB — BASIC METABOLIC PANEL
Anion gap: 10 (ref 5–15)
BUN: 15 mg/dL (ref 8–23)
CO2: 30 mmol/L (ref 22–32)
Calcium: 9 mg/dL (ref 8.9–10.3)
Chloride: 86 mmol/L — ABNORMAL LOW (ref 98–111)
Creatinine, Ser: 0.8 mg/dL (ref 0.61–1.24)
GFR calc Af Amer: >60 mL/min (ref >60)
GFR calc non Af Amer: >60 mL/min (ref >60)
Glucose, Bld: 111 mg/dL — ABNORMAL HIGH (ref 70–99)
Potassium: 3.7 mmol/L (ref 3.5–5.1)
Sodium: 126 mmol/L — ABNORMAL LOW (ref 135–145)

## 2018-04-25 LAB — RAPID URINE DRUG SCREEN, HOSP PERFORMED
Amphetamines: NOT DETECTED
Barbiturates: POSITIVE — AB
Benzodiazepines: NOT DETECTED
Cocaine: NOT DETECTED
Opiates: NOT DETECTED
Tetrahydrocannabinol: NOT DETECTED

## 2018-04-25 LAB — CBC
HCT: 40.7 % (ref 39.0–52.0)
Hemoglobin: 14.3 g/dL (ref 13.0–17.0)
MCH: 35 pg — ABNORMAL HIGH (ref 26.0–34.0)
MCHC: 35.1 g/dL (ref 30.0–36.0)
MCV: 99.5 fL (ref 78.0–100.0)
Platelets: 228 10*3/uL (ref 150–400)
RBC: 4.09 MIL/uL — ABNORMAL LOW (ref 4.22–5.81)
RDW: 12.7 % (ref 11.5–15.5)
WBC: 9 10*3/uL (ref 4.0–10.5)

## 2018-04-25 MED ORDER — GUAIFENESIN-DM 100-10 MG/5ML PO SYRP
5.0000 mL | ORAL_SOLUTION | ORAL | Status: DC | PRN
  Administered 2018-04-25 – 2018-04-28 (×9): 5 mL via ORAL
  Filled 2018-04-25 (×9): qty 5

## 2018-04-25 MED ORDER — ALUM & MAG HYDROXIDE-SIMETH 200-200-20 MG/5ML PO SUSP
30.0000 mL | Freq: Once | ORAL | Status: AC
  Administered 2018-04-25: 30 mL via ORAL
  Filled 2018-04-25: qty 30

## 2018-04-25 NOTE — Progress Notes (Signed)
Pt requesting to shower  Paged MD to inform

## 2018-04-25 NOTE — Progress Notes (Signed)
Progress Note  Patient Name: Travis Reese Date of Encounter: 04/25/2018  Primary Cardiologist: Reola Calkins Meda Coffee)  Subjective   Patient comfortable in bed, but very short of breath with movement. Has mild cough.  Inpatient Medications    Scheduled Meds: . buPROPion  150 mg Oral Daily  . carvedilol  3.125 mg Oral BID WC  . DULoxetine  60 mg Oral Daily  . enoxaparin (LOVENOX) injection  40 mg Subcutaneous Q24H  . feeding supplement (ENSURE ENLIVE)  237 mL Oral Q24H  . furosemide  60 mg Intravenous BID  . gatifloxacin  1 drop Right Eye QID  . losartan  50 mg Oral Daily  . nicotine  14 mg Transdermal Daily  . polyethylene glycol  17 g Oral BID  . prednisoLONE acetate  1 drop Right Eye QID  . primidone  50 mg Oral QHS  . QUEtiapine  25 mg Oral QHS  . senna  1 tablet Oral Daily  . sodium chloride flush  3 mL Intravenous Q12H  . tamsulosin  0.4 mg Oral QPC supper  . tiZANidine  4 mg Oral QHS  . vitamin C  1,000 mg Oral Daily   Continuous Infusions: . sodium chloride     PRN Meds: sodium chloride, acetaminophen, clonazePAM, guaiFENesin-dextromethorphan, ondansetron (ZOFRAN) IV, phenol, sodium chloride, sodium chloride flush   Vital Signs    Vitals:   04/24/18 0457 04/24/18 2001 04/25/18 0408 04/25/18 0743  BP: (!) 138/55 (!) 114/57 (!) 116/51 (!) 119/54  Pulse: 89 90 82 85  Resp: 18 19 18    Temp: 98.4 F (36.9 C) 98.8 F (37.1 C) (!) 97.4 F (36.3 C)   TempSrc: Oral Oral Oral   SpO2: 98% 98% 97%   Weight: 91.4 kg  91.4 kg   Height:        Intake/Output Summary (Last 24 hours) at 04/25/2018 1042 Last data filed at 04/25/2018 0814 Gross per 24 hour  Intake 283 ml  Output 900 ml  Net -617 ml   Filed Weights   04/23/18 0757 04/24/18 0457 04/25/18 0408  Weight: 93.7 kg 91.4 kg 91.4 kg    Telemetry    NSR - Personally Reviewed  ECG    NSR - Personally Reviewed  Physical Exam   GEN: No acute distress.  O2 cannula in place Neck: supple, no appreciable  JVD Cardiac: regular S1 and S2, no rubs or gallops. Distant heart sounds, did not appreciate diastolic murmur. 1/6 systolic murmur. Respiratory: Decreased breath sounds at bilateral bases GI: Soft, nontender, non-distended. Bowel sounds normal MS: No edema; No deformity. Neuro:  Nonfocal, moves all limbs independently Psych: Normal affect   Labs    Chemistry Recent Labs  Lab 04/19/18 1233  04/23/18 0507 04/24/18 0542 04/25/18 0413  NA 128*   < > 128* 129* 126*  K 3.9   < > 5.6* 3.8 3.7  CL 93*   < > 89* 87* 86*  CO2 23   < > 32 30 30  GLUCOSE 122*   < > 100* 138* 111*  BUN 17   < > 17 14 15   CREATININE 0.81   < > 0.83 0.86 0.80  CALCIUM 8.9   < > 8.7* 9.2 9.0  PROT 6.5  --   --   --   --   ALBUMIN 3.4*  --   --   --   --   AST 26  --   --   --   --   ALT 22  --   --   --   --  ALKPHOS 61  --   --   --   --   BILITOT 1.3*  --   --   --   --   GFRNONAA >60   < > >60 >60 >60  GFRAA >60   < > >60 >60 >60  ANIONGAP 12   < > 7 12 10    < > = values in this interval not displayed.     Hematology Recent Labs  Lab 04/22/18 0409 04/23/18 0507 04/25/18 0413  WBC 9.0 7.5 9.0  RBC 3.96* 3.85* 4.09*  HGB 14.0 13.6 14.3  HCT 40.0 39.0 40.7  MCV 101.0* 101.3* 99.5  MCH 35.4* 35.3* 35.0*  MCHC 35.0 34.9 35.1  RDW 12.9 12.6 12.7  PLT 224 200 228    Cardiac EnzymesNo results for input(s): TROPONINI in the last 168 hours.  Recent Labs  Lab 04/19/18 1153  TROPIPOC 0.02     BNP Recent Labs  Lab 04/19/18 1233  BNP 1,549.5*     DDimer No results for input(s): DDIMER in the last 168 hours.   Radiology    Ct Angio Chest/abd/pel For Dissection W And/or W/wo  Result Date: 04/23/2018 CLINICAL DATA:  Evaluate for aortic dissection. 6-9 month history of progressive fatigue and lower extremity weakness with exertional shortness of breath. EXAM: CT ANGIOGRAPHY CHEST, ABDOMEN AND PELVIS TECHNIQUE: Multidetector CT imaging through the chest, abdomen and pelvis was performed  using the standard protocol during bolus administration of intravenous contrast. Multiplanar reconstructed images and MIPs were obtained and reviewed to evaluate the vascular anatomy. CONTRAST:  170mL ISOVUE-370 IOPAMIDOL (ISOVUE-370) INJECTION 76% COMPARISON:  Chest CT 04/12/2018. FINDINGS: CTA CHEST FINDINGS Cardiovascular: Heart is normal size. 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 No evidence of thoracic aortic dissection. Minimal calcified plaque over the thoracic aorta. Pulmonary arterial system is well opacified without emboli. Mediastinum/Nodes: No significant mediastinal or hilar adenopathy. Remaining mediastinal structures are within normal. Lungs/Pleura: Lungs are adequately inflated and demonstrate mild to moderate centrilobular emphysematous disease. Small amount of bilateral pleural fluid with associated bibasilar atelectatic change. Subtle patchy peripheral density over the anteromedial right middle lobe and lateral right upper lobe which may be due to atelectasis or early infection. No focal lobar consolidation. Mild aspirate material over the distal trachea and right mainstem bronchus. Musculoskeletal: Mild degenerate change of the spine. Review of the MIP images confirms the above findings. CTA ABDOMEN AND PELVIS FINDINGS VASCULAR Aorta: Mild calcified plaque without evidence of aneurysmal dilatation or dissection. Celiac: Minimal calcified plaque without evidence of stenosis or thrombosis. SMA: Within normal. Renals: Normal. IMA: Normal. Inflow: Mild calcified plaque over the iliac arteries which are otherwise patent without focal stenosis, occlusion or thrombosis. Veins: Unremarkable. Review of the MIP images confirms the above findings. NON-VASCULAR Hepatobiliary: Liver, gallbladder and biliary tree are normal. Pancreas: Normal. Spleen: Normal. Adrenals/Urinary Tract: Adrenal glands are normal. Kidneys are normal in size without  hydronephrosis or nephrolithiasis. Ureters and bladder are within normal. Stomach/Bowel: Stomach and small bowel are normal. Appendix is normal. There is mild diverticulosis of the colon without active inflammation. Lymphatic: No adenopathy. Reproductive: Normal. Other: No free fluid or focal inflammatory change. Musculoskeletal: Degenerative change of the spine. Mild biphasic curvature of the thoracolumbar spine. Mild degenerate change of the hips. Multilevel disc disease over the lumbar spine. Review of the MIP images confirms the above findings. IMPRESSION: No evidence of dissection involving the thoracoabdominal aorta. Small bilateral pleural effusions with bibasilar atelectasis slightly  improved. Subtle peripheral patchy density over the lateral right upper lobe and anteromedial right middle lobe likely atelectasis, although infection is possible. Aspirate material over the distal trachea and right mainstem bronchus. No acute findings in the abdomen/pelvis. 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 Recommend annual imaging followup by CTA or MRA. This recommendation follows 2010 ACCF/AHA/AATS/ACR/ASA/SCA/SCAI/SIR/STS/SVM Guidelines for the Diagnosis and Management of Patients with Thoracic Aortic Disease. Circulation. 2010; 121: B201-E071. Aortic Atherosclerosis (ICD10-I70.0) and Emphysema (ICD10-J43.9). Colonic diverticulosis. Electronically Signed   By: Marin Olp M.D.   On: 04/23/2018 20:38    Cardiac Studies   TTE:  EF 50-55% severe AR Patient Profile     72 y.o. male smoker with COPD admitted with CHF and severe AR  Assessment & Plan    Severe AR:  For TEE and right and left cath Monday CT shows aortic root of 4.0 cm so  Will likely need Bental. Discussed procedures with patient and he is comfortable with them CVTS Dr Roxy Manns to determine type/timing of surgery after TEE and cath Monday  COPD:  Still smoking 1/2 ppd FEV1 38% predicted with significant wheezing on  exam CXR this am  Continue inhalers  Discussed smoking cessation with him  Continue nicoderm Q  Jenkins Rouge

## 2018-04-25 NOTE — Progress Notes (Signed)
PROGRESS NOTE  Travis Reese GUR:[id number] DOB: 1945/10/27 DOA: 04/19/2018 PCP: Janine Limbo, PA-C   LOS: 6 days   Brief Narrative / Interim history: Travis Medina Flowersis a 72 y.o.malewith medical history significant ofCAD, hypertension, BPH who comes in with shortness of breath, dyspnea on exertion. Patient reports that for the past 2 to 3 weeks he has been having increasing dyspnea on exertion and shortness of breath. He has noted some trace lower extremity edema and possibly some orthopnea but no clear symptoms. He was admitted from 04/12/2018 to 04/14/2018 at [facility] with shortness of breath, records reviewed and he responded to diuresis, had a 2D echo which was found to have severe aortic regurgitation and plans were for patient to have outpatient referral to cardiothoracic surgery.  He was discharged in improved condition but over the few days of being home he continued to be progressively short of breath and was admitted to the hospital again.   Assessment & Plan: Principal Problem:   Severe aortic insufficiency Active Problems:   HTN (hypertension)   Acute CHF (congestive heart failure) (HCC)   BPH (benign prostatic hyperplasia)   Anxiety   Acute hypoxic respiratory failure due to pulmonary edema in the setting of severe aortic regurgitation -2D echo done 8/21 showed severe aortic regurgitation -Cardiology and cardiothoracic surgery consulted, plans for TEE/right and left heart cath followed at one point by surgery -Continue Coreg, Cozaar -Continue IV Lasix, monitor I's and O's, daily weights -Respiratory status stable, but remains short of breath with activity -Increasing cough, will repeat the chest x-ray today  Hyponatremia -Continue IV Lasix, sodium overall stable   BPH  -Continue Flomax  Hypertension -Continue Coreg, Cozaar, IV Lasix  Depression/anxiety -Continue bupropion, clonazepam, Seroquel, primidone and duloxetine  Tobacco abuse -Nicotine  patches   DVT prophylaxis: Lovenox Code Status: Full code Family Communication: no family at bedside Disposition Plan: home when ready   Consultants:   Cardiology   Cardiothoracic surgery   Procedures:   2D echo Study Conclusions - Left ventricle: The cavity size was normal. Wall thickness was increased in a pattern of mild LVH. Systolic function was normal. The estimated ejection fraction was in the range of 50% to 55%. Diffuse hypokinesis. The study is not technically sufficient to allow evaluation of LV diastolic function. - Aortic valve: There was severe regurgitation. - Left atrium: Volume/bsa, ES (1-plane Simpson&'s, A4C): 34.3 ml/m^2.  Antimicrobials:  None    Subjective: -Complains of a cough but otherwise no breathing difficulties at rest but only when he moves around  Objective: Vitals:   04/24/18 0457 04/24/18 2001 04/25/18 0408 04/25/18 0743  BP: (!) 138/55 (!) 114/57 (!) 116/51 (!) 119/54  Pulse: 89 90 82 85  Resp: 18 19 18    Temp: 98.4 F (36.9 C) 98.8 F (37.1 C) (!) 97.4 F (36.3 C)   TempSrc: Oral Oral Oral   SpO2: 98% 98% 97%   Weight: 91.4 kg  91.4 kg   Height:        Intake/Output Summary (Last 24 hours) at 04/25/2018 1100 Last data filed at 04/25/2018 0814 Gross per 24 hour  Intake 283 ml  Output 900 ml  Net -617 ml   Filed Weights   04/23/18 0757 04/24/18 0457 04/25/18 0408  Weight: 93.7 kg 91.4 kg 91.4 kg    Examination:  Constitutional: NAD Respiratory: Bibasilar crackles.  No wheezing. Cardiovascular: Regular rate and rhythm, no murmurs heard  Data Reviewed: I have independently reviewed following labs and imaging studies  CBC: Recent Labs  Lab 04/19/18 1233 04/22/18 0409 04/23/18 0507 04/25/18 0413  WBC 8.2 9.0 7.5 9.0  NEUTROABS 7.1  --   --   --   HGB 14.2 14.0 13.6 14.3  HCT 40.7 40.0 39.0 40.7  MCV 100.2* 101.0* 101.3* 99.5  PLT 187 224 200 244   Basic Metabolic Panel: Recent Labs  Lab 04/20/18 0637  04/21/18 0423 04/22/18 0409 04/23/18 0507 04/24/18 0542 04/25/18 0413  NA 134* 131* 130* 128* 129* 126*  K 3.9 3.5 3.9 5.6* 3.8 3.7  CL 95* 94* 94* 89* 87* 86*  CO2 28 29 28  32 30 30  GLUCOSE 107* 110* 100* 100* 138* 111*  BUN 17 23 22 17 14 15   CREATININE 0.99 0.94 0.84 0.83 0.86 0.80  CALCIUM 9.2 8.7* 8.5* 8.7* 9.2 9.0  MG 1.8  --   --   --  1.9  --    GFR: Estimated Creatinine Clearance: 98.5 mL/min (by C-G formula based on SCr of 0.8 mg/dL). Liver Function Tests: Recent Labs  Lab 04/19/18 1233  AST 26  ALT 22  ALKPHOS 61  BILITOT 1.3*  PROT 6.5  ALBUMIN 3.4*   No results for input(s): LIPASE, AMYLASE in the last 168 hours. No results for input(s): AMMONIA in the last 168 hours. Coagulation Profile: No results for input(s): INR, PROTIME in the last 168 hours. Cardiac Enzymes: No results for input(s): CKTOTAL, CKMB, CKMBINDEX, TROPONINI in the last 168 hours. BNP (last 3 results) No results for input(s): PROBNP in the last 8760 hours. HbA1C: No results for input(s): HGBA1C in the last 72 hours. CBG: No results for input(s): GLUCAP in the last 168 hours. Lipid Profile: No results for input(s): CHOL, HDL, LDLCALC, TRIG, CHOLHDL, LDLDIRECT in the last 72 hours. Thyroid Function Tests: No results for input(s): TSH, T4TOTAL, FREET4, T3FREE, THYROIDAB in the last 72 hours. Anemia Panel: No results for input(s): VITAMINB12, FOLATE, FERRITIN, TIBC, IRON, RETICCTPCT in the last 72 hours. Urine analysis:    Component Value Date/Time   COLORURINE STRAW (A) 04/19/2018 2159   APPEARANCEUR CLEAR 04/19/2018 2159   LABSPEC 1.004 (L) 04/19/2018 2159   PHURINE 7.0 04/19/2018 2159   GLUCOSEU NEGATIVE 04/19/2018 2159   HGBUR NEGATIVE 04/19/2018 2159   [place] NEGATIVE 04/19/2018 2159   [place] NEGATIVE 04/19/2018 2159   PROTEINUR NEGATIVE 04/19/2018 2159   NITRITE NEGATIVE 04/19/2018 2159   LEUKOCYTESUR NEGATIVE 04/19/2018 2159   Sepsis Labs: Invalid input(s):  PROCALCITONIN, LACTICIDVEN  Recent Results (from the past 240 hour(s))  Culture, blood (Routine X 2) w Reflex to ID Panel     Status: None (Preliminary result)   Collection Time: 04/23/18  6:20 PM  Result Value Ref Range Status   Specimen Description BLOOD RIGHT HAND  Final   Special Requests   Final    BOTTLES DRAWN AEROBIC ONLY Blood Culture adequate volume   Culture   Final    NO GROWTH < 24 HOURS Performed at [facility], [address], [place], [place] [address]    Report Status PENDING  Incomplete  Culture, blood (Routine X 2) w Reflex to ID Panel     Status: None (Preliminary result)   Collection Time: 04/23/18  6:25 PM  Result Value Ref Range Status   Specimen Description BLOOD RIGHT ANTECUBITAL  Final   Special Requests   Final    BOTTLES DRAWN AEROBIC ONLY Blood Culture adequate volume   Culture   Final    NO GROWTH < 24  HOURS Performed at [facility], [place] [address], [place], [place] [address]    Report Status PENDING  Incomplete      Radiology Studies: Ct Angio Chest/abd/pel For Dissection W And/or W/wo  Result Date: 04/23/2018 CLINICAL DATA:  Evaluate for aortic dissection. 6-9 month history of progressive fatigue and lower extremity weakness with exertional shortness of breath. EXAM: CT ANGIOGRAPHY CHEST, ABDOMEN AND PELVIS TECHNIQUE: Multidetector CT imaging through the chest, abdomen and pelvis was performed using the standard protocol during bolus administration of intravenous contrast. Multiplanar reconstructed images and MIPs were obtained and reviewed to evaluate the vascular anatomy. CONTRAST:  116mL ISOVUE-370 IOPAMIDOL (ISOVUE-370) INJECTION 76% COMPARISON:  Chest CT 04/12/2018. FINDINGS: CTA CHEST FINDINGS Cardiovascular: Heart is normal size. 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 No evidence of thoracic aortic dissection. Minimal calcified plaque over the thoracic aorta. Pulmonary  arterial system is well opacified without emboli. Mediastinum/Nodes: No significant mediastinal or hilar adenopathy. Remaining mediastinal structures are within normal. Lungs/Pleura: Lungs are adequately inflated and demonstrate mild to moderate centrilobular emphysematous disease. Small amount of bilateral pleural fluid with associated bibasilar atelectatic change. Subtle patchy peripheral density over the anteromedial right middle lobe and lateral right upper lobe which may be due to atelectasis or early infection. No focal lobar consolidation. Mild aspirate material over the distal trachea and right mainstem bronchus. Musculoskeletal: Mild degenerate change of the spine. Review of the MIP images confirms the above findings. CTA ABDOMEN AND PELVIS FINDINGS VASCULAR Aorta: Mild calcified plaque without evidence of aneurysmal dilatation or dissection. Celiac: Minimal calcified plaque without evidence of stenosis or thrombosis. SMA: Within normal. Renals: Normal. IMA: Normal. Inflow: Mild calcified plaque over the iliac arteries which are otherwise patent without focal stenosis, occlusion or thrombosis. Veins: Unremarkable. Review of the MIP images confirms the above findings. NON-VASCULAR Hepatobiliary: Liver, gallbladder and biliary tree are normal. Pancreas: Normal. Spleen: Normal. Adrenals/Urinary Tract: Adrenal glands are normal. Kidneys are normal in size without hydronephrosis or nephrolithiasis. Ureters and bladder are within normal. Stomach/Bowel: Stomach and small bowel are normal. Appendix is normal. There is mild diverticulosis of the colon without active inflammation. Lymphatic: No adenopathy. Reproductive: Normal. Other: No free fluid or focal inflammatory change. Musculoskeletal: Degenerative change of the spine. Mild biphasic curvature of the thoracolumbar spine. Mild degenerate change of the hips. Multilevel disc disease over the lumbar spine. Review of the MIP images confirms the above findings.  IMPRESSION: No evidence of dissection involving the thoracoabdominal aorta. Small bilateral pleural effusions with bibasilar atelectasis slightly improved. Subtle peripheral patchy density over the lateral right upper lobe and anteromedial right middle lobe likely atelectasis, although infection is possible. Aspirate material over the distal trachea and right mainstem bronchus. No acute findings in the abdomen/pelvis. 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 Recommend annual imaging followup by CTA or MRA. This recommendation follows 2010 ACCF/AHA/AATS/ACR/ASA/SCA/SCAI/SIR/STS/SVM Guidelines for the Diagnosis and Management of Patients with Thoracic Aortic Disease. Circulation. 2010; 121: X914-N829. Aortic Atherosclerosis (ICD10-I70.0) and Emphysema (ICD10-J43.9). Colonic diverticulosis. Electronically Signed   By: Marin Olp M.D.   On: 04/23/2018 20:38     Scheduled Meds: . buPROPion  150 mg Oral Daily  . carvedilol  3.125 mg Oral BID WC  . DULoxetine  60 mg Oral Daily  . enoxaparin (LOVENOX) injection  40 mg Subcutaneous Q24H  . feeding supplement (ENSURE ENLIVE)  237 mL Oral Q24H  . furosemide  60 mg Intravenous BID  .  gatifloxacin  1 drop Right Eye QID  . losartan  50 mg Oral Daily  . nicotine  14 mg Transdermal Daily  . polyethylene glycol  17 g Oral BID  . prednisoLONE acetate  1 drop Right Eye QID  . primidone  50 mg Oral QHS  . QUEtiapine  25 mg Oral QHS  . senna  1 tablet Oral Daily  . sodium chloride flush  3 mL Intravenous Q12H  . tamsulosin  0.4 mg Oral QPC supper  . tiZANidine  4 mg Oral QHS  . vitamin C  1,000 mg Oral Daily   Continuous Infusions: . sodium chloride     Marzetta Board, MD, PhD [facility] Pager [phone]  If 7PM-7AM, please contact night-coverage [email or web address] Password [email or web address] 04/25/2018, 11:00 AM

## 2018-04-26 LAB — BASIC METABOLIC PANEL
Anion gap: 11 (ref 5–15)
BUN: 13 mg/dL (ref 8–23)
CO2: 29 mmol/L (ref 22–32)
Calcium: 8.9 mg/dL (ref 8.9–10.3)
Chloride: 84 mmol/L — ABNORMAL LOW (ref 98–111)
Creatinine, Ser: 0.73 mg/dL (ref 0.61–1.24)
GFR calc Af Amer: >60 mL/min (ref >60)
GFR calc non Af Amer: >60 mL/min (ref >60)
Glucose, Bld: 106 mg/dL — ABNORMAL HIGH (ref 70–99)
Potassium: 3.6 mmol/L (ref 3.5–5.1)
Sodium: 124 mmol/L — ABNORMAL LOW (ref 135–145)

## 2018-04-26 MED ORDER — SODIUM CHLORIDE 0.9 % IV SOLN: 250.0000 mL | INTRAVENOUS | Status: DC | PRN

## 2018-04-26 MED ORDER — SODIUM CHLORIDE 0.9 % IV SOLN: INTRAVENOUS | Status: DC

## 2018-04-26 MED ORDER — SODIUM CHLORIDE 0.9 % IV SOLN
INTRAVENOUS | Status: DC
  Administered 2018-04-27: 900 mL via INTRAVENOUS

## 2018-04-26 MED ORDER — SODIUM CHLORIDE 0.9% FLUSH: 3.0000 mL | INTRAVENOUS | Status: DC | PRN

## 2018-04-26 MED ORDER — ASPIRIN 81 MG PO CHEW
81.0000 mg | CHEWABLE_TABLET | ORAL | Status: AC
  Administered 2018-04-27: 81 mg via ORAL
  Filled 2018-04-26: qty 1

## 2018-04-26 MED ORDER — SODIUM CHLORIDE 0.9% FLUSH
3.0000 mL | Freq: Two times a day (BID) | INTRAVENOUS | Status: DC
  Administered 2018-04-26 – 2018-04-27 (×2): 3 mL via INTRAVENOUS

## 2018-04-26 MED ORDER — FUROSEMIDE 40 MG PO TABS
40.0000 mg | ORAL_TABLET | Freq: Two times a day (BID) | ORAL | Status: DC
  Administered 2018-04-26 – 2018-04-28 (×4): 40 mg via ORAL
  Filled 2018-04-26 (×4): qty 1

## 2018-04-26 NOTE — Progress Notes (Signed)
PROGRESS NOTE  TACARI REPASS TLX:[id number] DOB: 02-Jul-1946 DOA: 04/19/2018 PCP: Janine Limbo, PA-C   LOS: 7 days   Brief Narrative / Interim history: Travis Medina Flowersis a 72 y.o.malewith medical history significant ofCAD, hypertension, BPH who comes in with shortness of breath, dyspnea on exertion. Patient reports that for the past 2 to 3 weeks he has been having increasing dyspnea on exertion and shortness of breath. He has noted some trace lower extremity edema and possibly some orthopnea but no clear symptoms. He was admitted from 04/12/2018 to 04/14/2018 at [facility] with shortness of breath, records reviewed and he responded to diuresis, had a 2D echo which was found to have severe aortic regurgitation and plans were for patient to have outpatient referral to cardiothoracic surgery.  He was discharged in improved condition but over the few days of being home he continued to be progressively short of breath and was admitted to the hospital again.   Assessment & Plan: Principal Problem:   Severe aortic insufficiency Active Problems:   HTN (hypertension)   Acute CHF (congestive heart failure) (HCC)   BPH (benign prostatic hyperplasia)   Anxiety   Acute hypoxic respiratory failure due to pulmonary edema in the setting of severe aortic regurgitation -2D echo done 8/21 showed severe aortic regurgitation -Cardiology and cardiothoracic surgery consulted, -TEE and cath tomorrow -Continue Coreg, Cozaar -Continue IV Lasix -Respiratory status improving and he is less short of breath today  Hyponatremia -Continue IV Lasix, he is hyponatremic but overall sodium stable without significant fluctuations.  Suspect will improve once fluid status is better   BPH  -Continue Flomax, no issues and urinating well  Hypertension -Continue Coreg, Cozaar, IV Lasix, blood pressure within normal parameters this morning  Depression/anxiety -Continue bupropion, clonazepam, Seroquel,  primidone and duloxetine  Tobacco abuse -Nicotine patches   DVT prophylaxis: Lovenox Code Status: Full code Family Communication: no family at bedside Disposition Plan: home when ready   Consultants:   Cardiology   Cardiothoracic surgery   Procedures:   2D echo Study Conclusions - Left ventricle: The cavity size was normal. Wall thickness was increased in a pattern of mild LVH. Systolic function was normal. The estimated ejection fraction was in the range of 50% to 55%. Diffuse hypokinesis. The study is not technically sufficient to allow evaluation of LV diastolic function. - Aortic valve: There was severe regurgitation. - Left atrium: Volume/bsa, ES (1-plane Simpson&'s, A4C): 34.3 ml/m^2.  Antimicrobials:  None    Subjective: -Cough is gotten better, breathing is gotten better, he is less dyspneic when he goes to the bathroom  Objective: Vitals:   04/25/18 2005 04/26/18 0416 04/26/18 0907 04/26/18 1111  BP: 130/61 (!) 101/45 (!) 109/48 (!) 106/50  Pulse: 88 92 93 85  Resp: 18 18  18   Temp: 99.3 F (37.4 C) 99.1 F (37.3 C)  98 F (36.7 C)  TempSrc: Oral Oral  Oral  SpO2: 99% 98%  100%  Weight:  91.2 kg    Height:        Intake/Output Summary (Last 24 hours) at 04/26/2018 1146 Last data filed at 04/26/2018 0420 Gross per 24 hour  Intake 600 ml  Output 1500 ml  Net -900 ml   Filed Weights   04/24/18 0457 04/25/18 0408 04/26/18 0416  Weight: 91.4 kg 91.4 kg 91.2 kg    Examination:  Constitutional: NAD, calm, comfortable.  Watching a movie on his laptop Eyes: lids and conjunctivae normal ENMT: Mucous membranes are moist.  Neck: normal, supple  Respiratory: Faint bibasilar crackles without wheezing.  Improved compared to yesterday Cardiovascular: Regular rate and rhythm, no murmurs / rubs / gallops. No LE edema. 2+ pedal pulses.  Abdomen: no tenderness. Bowel sounds positive.  Neurologic: non focal   Data Reviewed: I have independently reviewed  following labs and imaging studies   CBC: Recent Labs  Lab 04/19/18 1233 04/22/18 0409 04/23/18 0507 04/25/18 0413  WBC 8.2 9.0 7.5 9.0  NEUTROABS 7.1  --   --   --   HGB 14.2 14.0 13.6 14.3  HCT 40.7 40.0 39.0 40.7  MCV 100.2* 101.0* 101.3* 99.5  PLT 187 224 200 892   Basic Metabolic Panel: Recent Labs  Lab 04/20/18 0637  04/22/18 0409 04/23/18 0507 04/24/18 0542 04/25/18 0413 04/26/18 0608  NA 134*   < > 130* 128* 129* 126* 124*  K 3.9   < > 3.9 5.6* 3.8 3.7 3.6  CL 95*   < > 94* 89* 87* 86* 84*  CO2 28   < > 28 32 30 30 29   GLUCOSE 107*   < > 100* 100* 138* 111* 106*  BUN 17   < > 22 17 14 15 13   CREATININE 0.99   < > 0.84 0.83 0.86 0.80 0.73  CALCIUM 9.2   < > 8.5* 8.7* 9.2 9.0 8.9  MG 1.8  --   --   --  1.9  --   --    < > = values in this interval not displayed.   GFR: Estimated Creatinine Clearance: 98.5 mL/min (by C-G formula based on SCr of 0.73 mg/dL). Liver Function Tests: Recent Labs  Lab 04/19/18 1233  AST 26  ALT 22  ALKPHOS 61  BILITOT 1.3*  PROT 6.5  ALBUMIN 3.4*   No results for input(s): LIPASE, AMYLASE in the last 168 hours. No results for input(s): AMMONIA in the last 168 hours. Coagulation Profile: No results for input(s): INR, PROTIME in the last 168 hours. Cardiac Enzymes: No results for input(s): CKTOTAL, CKMB, CKMBINDEX, TROPONINI in the last 168 hours. BNP (last 3 results) No results for input(s): PROBNP in the last 8760 hours. HbA1C: No results for input(s): HGBA1C in the last 72 hours. CBG: No results for input(s): GLUCAP in the last 168 hours. Lipid Profile: No results for input(s): CHOL, HDL, LDLCALC, TRIG, CHOLHDL, LDLDIRECT in the last 72 hours. Thyroid Function Tests: No results for input(s): TSH, T4TOTAL, FREET4, T3FREE, THYROIDAB in the last 72 hours. Anemia Panel: No results for input(s): VITAMINB12, FOLATE, FERRITIN, TIBC, IRON, RETICCTPCT in the last 72 hours. Urine analysis:    Component Value Date/Time    COLORURINE STRAW (A) 04/19/2018 2159   APPEARANCEUR CLEAR 04/19/2018 2159   LABSPEC 1.004 (L) 04/19/2018 2159   PHURINE 7.0 04/19/2018 2159   GLUCOSEU NEGATIVE 04/19/2018 2159   HGBUR NEGATIVE 04/19/2018 2159   [place] NEGATIVE 04/19/2018 2159   [place] NEGATIVE 04/19/2018 2159   PROTEINUR NEGATIVE 04/19/2018 2159   NITRITE NEGATIVE 04/19/2018 2159   LEUKOCYTESUR NEGATIVE 04/19/2018 2159   Sepsis Labs: Invalid input(s): PROCALCITONIN, LACTICIDVEN  Recent Results (from the past 240 hour(s))  Culture, blood (Routine X 2) w Reflex to ID Panel     Status: None (Preliminary result)   Collection Time: 04/23/18  6:20 PM  Result Value Ref Range Status   Specimen Description BLOOD RIGHT HAND  Final   Special Requests   Final    BOTTLES DRAWN AEROBIC ONLY Blood Culture adequate volume   Culture   Final  NO GROWTH 2 DAYS Performed at [facility], [place] [address] [place], [place] [address]    Report Status PENDING  Incomplete  Culture, blood (Routine X 2) w Reflex to ID Panel     Status: None (Preliminary result)   Collection Time: 04/23/18  6:25 PM  Result Value Ref Range Status   Specimen Description BLOOD RIGHT ANTECUBITAL  Final   Special Requests   Final    BOTTLES DRAWN AEROBIC ONLY Blood Culture adequate volume   Culture   Final    NO GROWTH 2 DAYS Performed at [facility], [place] [address], [place], [place] [address]    Report Status PENDING  Incomplete      Radiology Studies: Dg Chest 2 View  Result Date: 04/25/2018 CLINICAL DATA:  Cough and shortness of breath. EXAM: CHEST - 2 VIEW COMPARISON:  04/19/2018 and prior radiographs FINDINGS: Cardiomegaly and COPD/emphysema changes again noted. There may be trace pleural effusions present. There is no evidence of focal airspace disease, pulmonary edema, suspicious pulmonary nodule/mass or pneumothorax. No acute bony abnormalities are identified. IMPRESSION: Cardiomegaly, COPD and possible trace pleural  effusions. Electronically Signed   By: Margarette Canada M.D.   On: 04/25/2018 11:04     Scheduled Meds: . buPROPion  150 mg Oral Daily  . carvedilol  3.125 mg Oral BID WC  . DULoxetine  60 mg Oral Daily  . enoxaparin (LOVENOX) injection  40 mg Subcutaneous Q24H  . feeding supplement (ENSURE ENLIVE)  237 mL Oral Q24H  . furosemide  40 mg Oral BID  . gatifloxacin  1 drop Right Eye QID  . losartan  50 mg Oral Daily  . nicotine  14 mg Transdermal Daily  . polyethylene glycol  17 g Oral BID  . prednisoLONE acetate  1 drop Right Eye QID  . primidone  50 mg Oral QHS  . QUEtiapine  25 mg Oral QHS  . senna  1 tablet Oral Daily  . sodium chloride flush  3 mL Intravenous Q12H  . tamsulosin  0.4 mg Oral QPC supper  . tiZANidine  4 mg Oral QHS  . vitamin C  1,000 mg Oral Daily   Continuous Infusions: . sodium chloride     Marzetta Board, MD, PhD [facility] Pager [phone]  If 7PM-7AM, please contact night-coverage [email or web address] Password [facility] 04/26/2018, 11:46 AM

## 2018-04-26 NOTE — Progress Notes (Signed)
Progress Note  Patient Name: Travis Reese Date of Encounter: 04/26/2018  Primary Cardiologist: Reola Calkins Meda Coffee)  Subjective   Very good day no dyspnea watching movie on laptop  Inpatient Medications    Scheduled Meds: . buPROPion  150 mg Oral Daily  . carvedilol  3.125 mg Oral BID WC  . DULoxetine  60 mg Oral Daily  . enoxaparin (LOVENOX) injection  40 mg Subcutaneous Q24H  . feeding supplement (ENSURE ENLIVE)  237 mL Oral Q24H  . furosemide  60 mg Intravenous BID  . gatifloxacin  1 drop Right Eye QID  . losartan  50 mg Oral Daily  . nicotine  14 mg Transdermal Daily  . polyethylene glycol  17 g Oral BID  . prednisoLONE acetate  1 drop Right Eye QID  . primidone  50 mg Oral QHS  . QUEtiapine  25 mg Oral QHS  . senna  1 tablet Oral Daily  . sodium chloride flush  3 mL Intravenous Q12H  . tamsulosin  0.4 mg Oral QPC supper  . tiZANidine  4 mg Oral QHS  . vitamin C  1,000 mg Oral Daily   Continuous Infusions: . sodium chloride     PRN Meds: sodium chloride, acetaminophen, clonazePAM, guaiFENesin-dextromethorphan, ondansetron (ZOFRAN) IV, phenol, sodium chloride, sodium chloride flush   Vital Signs    Vitals:   04/25/18 1242 04/25/18 2005 04/26/18 0416 04/26/18 0907  BP: (!) 120/55 130/61 (!) 101/45 (!) 109/48  Pulse: 92 88 92 93  Resp: 18 18 18    Temp: 98.7 F (37.1 C) 99.3 F (37.4 C) 99.1 F (37.3 C)   TempSrc: Oral Oral Oral   SpO2: 99% 99% 98%   Weight:   91.2 kg   Height:        Intake/Output Summary (Last 24 hours) at 04/26/2018 1037 Last data filed at 04/26/2018 0420 Gross per 24 hour  Intake 600 ml  Output 1650 ml  Net -1050 ml   Filed Weights   04/24/18 0457 04/25/18 0408 04/26/18 0416  Weight: 91.4 kg 91.4 kg 91.2 kg    Telemetry    NSR - Personally Reviewed  ECG    NSR - Personally Reviewed  Physical Exam   GEN: No acute distress.  O2 cannula in place Neck: supple, no appreciable JVD Cardiac: regular S1 and S2, no rubs or  gallops. Distant heart sounds, did not appreciate diastolic murmur. 1/6 systolic murmur. Respiratory: Decreased breath sounds at bilateral bases GI: Soft, nontender, non-distended. Bowel sounds normal MS: No edema; No deformity. Neuro:  Nonfocal, moves all limbs independently Psych: Normal affect   Labs    Chemistry Recent Labs  Lab 04/19/18 1233  04/24/18 0542 04/25/18 0413 04/26/18 0608  NA 128*   < > 129* 126* 124*  K 3.9   < > 3.8 3.7 3.6  CL 93*   < > 87* 86* 84*  CO2 23   < > 30 30 29   GLUCOSE 122*   < > 138* 111* 106*  BUN 17   < > 14 15 13   CREATININE 0.81   < > 0.86 0.80 0.73  CALCIUM 8.9   < > 9.2 9.0 8.9  PROT 6.5  --   --   --   --   ALBUMIN 3.4*  --   --   --   --   AST 26  --   --   --   --   ALT 22  --   --   --   --  ALKPHOS 61  --   --   --   --   BILITOT 1.3*  --   --   --   --   GFRNONAA >60   < > >60 >60 >60  GFRAA >60   < > >60 >60 >60  ANIONGAP 12   < > 12 10 11    < > = values in this interval not displayed.     Hematology Recent Labs  Lab 04/22/18 0409 04/23/18 0507 04/25/18 0413  WBC 9.0 7.5 9.0  RBC 3.96* 3.85* 4.09*  HGB 14.0 13.6 14.3  HCT 40.0 39.0 40.7  MCV 101.0* 101.3* 99.5  MCH 35.4* 35.3* 35.0*  MCHC 35.0 34.9 35.1  RDW 12.9 12.6 12.7  PLT 224 200 228    Cardiac EnzymesNo results for input(s): TROPONINI in the last 168 hours.  Recent Labs  Lab 04/19/18 1153  TROPIPOC 0.02     BNP Recent Labs  Lab 04/19/18 1233  BNP 1,549.5*     DDimer No results for input(s): DDIMER in the last 168 hours.   Radiology    Dg Chest 2 View  Result Date: 04/25/2018 CLINICAL DATA:  Cough and shortness of breath. EXAM: CHEST - 2 VIEW COMPARISON:  04/19/2018 and prior radiographs FINDINGS: Cardiomegaly and COPD/emphysema changes again noted. There may be trace pleural effusions present. There is no evidence of focal airspace disease, pulmonary edema, suspicious pulmonary nodule/mass or pneumothorax. No acute bony abnormalities are  identified. IMPRESSION: Cardiomegaly, COPD and possible trace pleural effusions. Electronically Signed   By: Margarette Canada M.D.   On: 04/25/2018 11:04    Cardiac Studies   TTE:  EF 50-55% severe AR Patient Profile     72 y.o. male smoker with COPD admitted with CHF and severe AR  Assessment & Plan    Severe AR:  For TEE and right and left cath Monday CT shows aortic root of 4.0 cm so  Will likely need Bental. Discussed procedures with patient and he is comfortable with them CVTS Dr Roxy Manns to determine type/timing of surgery after TEE and cath Monday Still hyponatremic Will change to PO lasix at lower dose   COPD:  Still smoking 1/2 ppd FEV1 38% predicted with significant wheezing on exam CXR this am  Continue inhalers  Discussed smoking cessation with him  Continue nicoderm Q  Jenkins Rouge

## 2018-04-26 NOTE — Progress Notes (Signed)
MD aware of sodium level 124 today Will continue to monitor

## 2018-04-27 ENCOUNTER — Inpatient Hospital Stay (HOSPITAL_COMMUNITY): Payer: Medicare Other

## 2018-04-27 ENCOUNTER — Inpatient Hospital Stay (HOSPITAL_COMMUNITY)
Admit: 2018-04-27 | Discharge: 2018-04-27 | Disposition: A | Discharge status: Home / Self Care | Payer: Medicare Other | Attending: Adult Health | Admitting: Adult Health

## 2018-04-27 ENCOUNTER — Encounter (HOSPITAL_COMMUNITY)
Admission: EM | Disposition: A | Discharge status: Home / Self Care | Payer: Self-pay | Source: Home / Self Care | Attending: Thoracic Surgery (Cardiothoracic Vascular Surgery)

## 2018-04-27 ENCOUNTER — Encounter (HOSPITAL_COMMUNITY): Payer: Self-pay

## 2018-04-27 DIAGNOSIS — Z01810 Encounter for preprocedural cardiovascular examination: Secondary | ICD-10-CM

## 2018-04-27 DIAGNOSIS — I351 Nonrheumatic aortic (valve) insufficiency: Secondary | ICD-10-CM

## 2018-04-27 DIAGNOSIS — I2510 Atherosclerotic heart disease of native coronary artery without angina pectoris: Secondary | ICD-10-CM

## 2018-04-27 HISTORY — PX: TEE WITHOUT CARDIOVERSION: SHX5443

## 2018-04-27 HISTORY — PX: RIGHT/LEFT HEART CATH AND CORONARY ANGIOGRAPHY: CATH118266

## 2018-04-27 LAB — BASIC METABOLIC PANEL
Anion gap: 13 (ref 5–15)
BUN: 13 mg/dL (ref 8–23)
CO2: 27 mmol/L (ref 22–32)
Calcium: 8.9 mg/dL (ref 8.9–10.3)
Chloride: 84 mmol/L — ABNORMAL LOW (ref 98–111)
Creatinine, Ser: 0.67 mg/dL (ref 0.61–1.24)
GFR calc Af Amer: >60 mL/min (ref >60)
GFR calc non Af Amer: >60 mL/min (ref >60)
Glucose, Bld: 113 mg/dL — ABNORMAL HIGH (ref 70–99)
Potassium: 3.6 mmol/L (ref 3.5–5.1)
Sodium: 124 mmol/L — ABNORMAL LOW (ref 135–145)

## 2018-04-27 LAB — POCT I-STAT 3, VENOUS BLOOD GAS (G3P V)
Acid-Base Excess: 5 mmol/L — ABNORMAL HIGH (ref 0.0–2.0)
Acid-Base Excess: 5 mmol/L — ABNORMAL HIGH (ref 0.0–2.0)
Bicarbonate: 25.9 mmol/L (ref 20.0–28.0)
Bicarbonate: 29.8 mmol/L — ABNORMAL HIGH (ref 20.0–28.0)
Bicarbonate: 30.4 mmol/L — ABNORMAL HIGH (ref 20.0–28.0)
O2 Saturation: 69 %
O2 Saturation: 73 %
O2 Saturation: 99 %
TCO2: 27 mmol/L (ref 22–32)
TCO2: 31 mmol/L (ref 22–32)
TCO2: 32 mmol/L (ref 22–32)
pCO2, Ven: 44.5 mmHg (ref 44.0–60.0)
pCO2, Ven: 45.5 mmHg (ref 44.0–60.0)
pCO2, Ven: 48.4 mmHg (ref 44.0–60.0)
pH, Ven: 7.364 (ref 7.250–7.430)
pH, Ven: 7.406 (ref 7.250–7.430)
pH, Ven: 7.434 — ABNORMAL HIGH (ref 7.250–7.430)
pO2, Ven: 129 mmHg — ABNORMAL HIGH (ref 32.0–45.0)
pO2, Ven: 38 mmHg (ref 32.0–45.0)
pO2, Ven: 39 mmHg (ref 32.0–45.0)

## 2018-04-27 LAB — CBC
HCT: 37.7 % — ABNORMAL LOW (ref 39.0–52.0)
Hemoglobin: 13.6 g/dL (ref 13.0–17.0)
MCH: 35.3 pg — ABNORMAL HIGH (ref 26.0–34.0)
MCHC: 36.1 g/dL — ABNORMAL HIGH (ref 30.0–36.0)
MCV: 97.9 fL (ref 78.0–100.0)
Platelets: 229 10*3/uL (ref 150–400)
RBC: 3.85 MIL/uL — ABNORMAL LOW (ref 4.22–5.81)
RDW: 12.3 % (ref 11.5–15.5)
WBC: 12.5 10*3/uL — ABNORMAL HIGH (ref 4.0–10.5)

## 2018-04-27 LAB — MAGNESIUM: Magnesium: 1.7 mg/dL (ref 1.7–2.4)

## 2018-04-27 LAB — PROCALCITONIN: Procalcitonin: <0.1 ng/mL

## 2018-04-27 SURGERY — RIGHT/LEFT HEART CATH AND CORONARY ANGIOGRAPHY
Anesthesia: LOCAL

## 2018-04-27 SURGERY — ECHOCARDIOGRAM, TRANSESOPHAGEAL
Anesthesia: Moderate Sedation

## 2018-04-27 MED ORDER — ONDANSETRON HCL 4 MG/2ML IJ SOLN: 4.0000 mg | Freq: Four times a day (QID) | INTRAMUSCULAR | Status: DC | PRN

## 2018-04-27 MED ORDER — HEPARIN SODIUM (PORCINE) 1000 UNIT/ML IJ SOLN
INTRAMUSCULAR | Status: AC
  Filled 2018-04-27: qty 1

## 2018-04-27 MED ORDER — HEPARIN (PORCINE) IN NACL 1000-0.9 UT/500ML-% IV SOLN
INTRAVENOUS | Status: DC | PRN
  Administered 2018-04-27 (×2): 500 mL

## 2018-04-27 MED ORDER — FENTANYL CITRATE (PF) 100 MCG/2ML IJ SOLN
INTRAMUSCULAR | Status: DC | PRN
  Administered 2018-04-27: 25 ug via INTRAVENOUS

## 2018-04-27 MED ORDER — MIDAZOLAM HCL 10 MG/2ML IJ SOLN
INTRAMUSCULAR | Status: DC | PRN
  Administered 2018-04-27: 2 mg via INTRAVENOUS
  Administered 2018-04-27: 1 mg via INTRAVENOUS

## 2018-04-27 MED ORDER — SODIUM CHLORIDE 0.9% FLUSH
3.0000 mL | Freq: Two times a day (BID) | INTRAVENOUS | Status: DC
  Administered 2018-04-27 – 2018-04-30 (×7): 3 mL via INTRAVENOUS

## 2018-04-27 MED ORDER — SODIUM CHLORIDE 0.9% FLUSH: 3.0000 mL | INTRAVENOUS | Status: DC | PRN

## 2018-04-27 MED ORDER — SODIUM CHLORIDE 0.9 % IV SOLN: 250.0000 mL | INTRAVENOUS | Status: DC | PRN

## 2018-04-27 MED ORDER — ACETAMINOPHEN 325 MG PO TABS: 650.0000 mg | ORAL_TABLET | ORAL | Status: DC | PRN

## 2018-04-27 MED ORDER — FENTANYL CITRATE (PF) 100 MCG/2ML IJ SOLN
INTRAMUSCULAR | Status: AC
  Filled 2018-04-27: qty 2

## 2018-04-27 MED ORDER — VERAPAMIL HCL 2.5 MG/ML IV SOLN
INTRAVENOUS | Status: AC
  Filled 2018-04-27: qty 2

## 2018-04-27 MED ORDER — HEPARIN (PORCINE) IN NACL 1000-0.9 UT/500ML-% IV SOLN
INTRAVENOUS | Status: AC
  Filled 2018-04-27: qty 500

## 2018-04-27 MED ORDER — HEPARIN SODIUM (PORCINE) 1000 UNIT/ML IJ SOLN
INTRAMUSCULAR | Status: DC | PRN
  Administered 2018-04-27: 4500 [IU] via INTRAVENOUS

## 2018-04-27 MED ORDER — IOPAMIDOL (ISOVUE-370) INJECTION 76%
INTRAVENOUS | Status: DC | PRN
  Administered 2018-04-27: 65 mL via INTRA_ARTERIAL

## 2018-04-27 MED ORDER — MIDAZOLAM HCL 2 MG/2ML IJ SOLN
INTRAMUSCULAR | Status: DC | PRN
  Administered 2018-04-27: 1 mg via INTRAVENOUS

## 2018-04-27 MED ORDER — BUTAMBEN-TETRACAINE-BENZOCAINE 2-2-14 % EX AERO
INHALATION_SPRAY | CUTANEOUS | Status: DC | PRN
  Administered 2018-04-27: 2 via TOPICAL

## 2018-04-27 MED ORDER — MAGNESIUM SULFATE 4 GM/100ML IV SOLN
4.0000 g | Freq: Once | INTRAVENOUS | Status: AC
  Administered 2018-04-27: 4 g via INTRAVENOUS
  Filled 2018-04-27: qty 100

## 2018-04-27 MED ORDER — SODIUM CHLORIDE 0.9 % IV SOLN
INTRAVENOUS | Status: AC
  Administered 2018-04-27: 16:00:00 via INTRAVENOUS

## 2018-04-27 MED ORDER — ENOXAPARIN SODIUM 40 MG/0.4ML ~~LOC~~ SOLN
40.0000 mg | SUBCUTANEOUS | Status: AC
  Administered 2018-04-28 – 2018-04-30 (×3): 40 mg via SUBCUTANEOUS
  Filled 2018-04-27 (×3): qty 0.4

## 2018-04-27 MED ORDER — LIDOCAINE HCL (PF) 1 % IJ SOLN
INTRAMUSCULAR | Status: DC | PRN
  Administered 2018-04-27: 5 mL

## 2018-04-27 MED ORDER — MIDAZOLAM HCL 2 MG/2ML IJ SOLN
INTRAMUSCULAR | Status: AC
  Filled 2018-04-27: qty 2

## 2018-04-27 MED ORDER — LIDOCAINE HCL (PF) 1 % IJ SOLN
INTRAMUSCULAR | Status: AC
  Filled 2018-04-27: qty 30

## 2018-04-27 MED ORDER — SODIUM CHLORIDE 0.9 % IV SOLN
2.0000 g | Freq: Two times a day (BID) | INTRAVENOUS | Status: DC
  Administered 2018-04-27 – 2018-04-30 (×6): 2 g via INTRAVENOUS
  Filled 2018-04-27 (×9): qty 2

## 2018-04-27 MED ORDER — FENTANYL CITRATE (PF) 100 MCG/2ML IJ SOLN
INTRAMUSCULAR | Status: DC | PRN
  Administered 2018-04-27 (×2): 25 ug via INTRAVENOUS

## 2018-04-27 MED ORDER — MIDAZOLAM HCL 5 MG/ML IJ SOLN
INTRAMUSCULAR | Status: AC
  Filled 2018-04-27: qty 2

## 2018-04-27 MED ORDER — VERAPAMIL HCL 2.5 MG/ML IV SOLN
INTRAVENOUS | Status: DC | PRN
  Administered 2018-04-27: 10 mL via INTRA_ARTERIAL

## 2018-04-27 MED ORDER — HYDROCORTISONE 1 % EX CREA
TOPICAL_CREAM | CUTANEOUS | Status: DC | PRN
  Administered 2018-04-27: 22:00:00 via TOPICAL
  Filled 2018-04-27: qty 28

## 2018-04-27 SURGICAL SUPPLY — 12 items
CATH BALLN WEDGE 5F 110CM (CATHETERS) ×2 IMPLANT
CATH IMPULSE 5F ANG/FL3.5 (CATHETERS) ×2 IMPLANT
DEVICE RAD COMP TR BAND LRG (VASCULAR PRODUCTS) ×2 IMPLANT
GLIDESHEATH SLEND SS 6F .021 (SHEATH) ×2 IMPLANT
GUIDEWIRE INQWIRE 1.5J.035X260 (WIRE) ×1 IMPLANT
INQWIRE 1.5J .035X260CM (WIRE) ×2
KIT HEART LEFT (KITS) ×2 IMPLANT
PACK CARDIAC CATHETERIZATION (CUSTOM PROCEDURE TRAY) ×2 IMPLANT
SHEATH GLIDE SLENDER 4/5FR (SHEATH) ×2 IMPLANT
SYR MEDRAD MARK V 150ML (SYRINGE) ×2 IMPLANT
TRANSDUCER W/STOPCOCK (MISCELLANEOUS) ×2 IMPLANT
TUBING CIL FLEX 10 FLL-RA (TUBING) ×2 IMPLANT

## 2018-04-27 NOTE — Research (Signed)
CADFEM Informed Consent   Subject Name: Travis Reese  Subject met inclusion and exclusion criteria.  The informed consent form, study requirements and expectations were reviewed with the subject and questions and concerns were addressed prior to the signing of the consent form.  The subject verbalized understanding of the trail requirements.  The subject agreed to participate in the CADFEM trial and signed the informed consent.  The informed consent was obtained prior to performance of any protocol-specific procedures for the subject.  A copy of the signed informed consent was given to the subject and a copy was placed in the subject's medical record.  [name] 04/27/2018, 8:27 AM

## 2018-04-27 NOTE — CV Procedure (Signed)
    TRANSESOPHAGEAL ECHOCARDIOGRAM   NAME:  LAVAR ROSENZWEIG   MRN: [id number] DOB:  1946/06/03   ADMIT DATE: 04/19/2018  INDICATIONS: Severe AI   PROCEDURE:   Informed consent was obtained prior to the procedure. The risks, benefits and alternatives for the procedure were discussed and the patient comprehended these risks.  Risks include, but are not limited to, cough, sore throat, vomiting, nausea, somnolence, esophageal and stomach trauma or perforation, bleeding, low blood pressure, aspiration, pneumonia, infection, trauma to the teeth and death.    After a procedural time-out, the patient was given 3 mg versed and 75 mcg fentanyl for moderate sedation.  The oropharynx was anesthetized 10 cc of topical 1% viscous lidocaine.  The transesophageal probe was inserted in the esophagus and stomach without difficulty and multiple views were obtained.    COMPLICATIONS:    There were no immediate complications.  FINDINGS:  LEFT VENTRICLE: EF = 55-60%. No regional wall motion abnormalities.  RIGHT VENTRICLE: Normal size and function.   LEFT ATRIUM: Normal  LEFT ATRIAL APPENDAGE: No thrombus.   RIGHT ATRIUM: Normal  AORTIC VALVE:  Trileaflet. Trileaflet prolapse with severe central AI. No obvious vegetations.   MITRAL VALVE:  Normal. Trivial MR  TRICUSPID VALVE: Normal. Mild TR  PULMONIC VALVE: Grossly normal. No PR.   INTERATRIAL SEPTUM: No PFO or ASD.  PERICARDIUM: No effusion  DESCENDING AORTA: Severe plaque    Benay Spice 2:57 PM

## 2018-04-27 NOTE — Care Management Important Message (Signed)
Important Message  Patient Details  Name: Travis Reese MRN: [id number] Date of Birth: September 01, 1946   Medicare Important Message Given:  Yes    Barb Merino Sonoita 04/27/2018, 12:27 PM

## 2018-04-27 NOTE — H&P (View-Only) (Signed)
Advanced Heart Failure Rounding Note  PCP-Cardiologist: No primary care provider on file.   Subjective:    Switched to PO lasix yesterday. Sluggish UOP. Weight unchanged. BMET pending.   WBC trending up, 12.5. Tmax 99.3. He has a productive cough with clear sputum. No chills or wheeze  Feels less swollen. Denies CP, SOB, orthopnea. Walking in hallways with no problems.   Going for [facility] and TEE today.    Objective:   Weight Range: 91.1 kg Body mass index is 26.49 kg/m.   Vital Signs:   Temp:  [98 F (36.7 C)-99.3 F (37.4 C)] 99.2 F (37.3 C) (08/26 0603) Pulse Rate:  [85-102] 102 (08/26 0603) Resp:  [16-18] 16 (08/26 0603) BP: (100-118)/(48-67) 118/50 (08/26 0603) SpO2:  [91 %-100 %] 91 % (08/26 0603) Weight:  [91.1 kg] 91.1 kg (08/26 0603) Last BM Date: 04/26/18  Weight change: Filed Weights   04/25/18 0408 04/26/18 0416 04/27/18 0603  Weight: 91.4 kg 91.2 kg 91.1 kg    Intake/Output:   Intake/Output Summary (Last 24 hours) at 04/27/2018 0732 Last data filed at 04/27/2018 0504 Gross per 24 hour  Intake 720 ml  Output 1000 ml  Net -280 ml      Physical Exam    General:  Well appearing. No resp difficulty. Sitting on side of bed.  HEENT: Normal Neck: Supple. JVP 6-7  Carotids 2+ bilat; no bruits. No lymphadenopathy or thyromegaly appreciated. Cor: PMI nondisplaced. Regular rate & rhythm. 2/6 AI. Lungs: diminished throughout. Fine crackles left base  Abdomen: Soft, nontender, nondistended. No hepatosplenomegaly. No bruits or masses. Good bowel sounds. Extremities: no cyanosis, clubbing, rash, edema Neuro: alert & oriented x 3, cranial nerves grossly intact. moves all 4 extremities w/o difficulty. Affect pleasant   Telemetry   NSR 80-90s. Personally reviewed.   EKG    No new tracings.   Labs    CBC Recent Labs    04/25/18 0413  WBC 9.0  HGB 14.3  HCT 40.7  MCV 99.5  PLT 371   Basic Metabolic Panel Recent Labs    04/25/18 0413  04/26/18 0608  NA 126* 124*  K 3.7 3.6  CL 86* 84*  CO2 30 29  GLUCOSE 111* 106*  BUN 15 13  CREATININE 0.80 0.73  CALCIUM 9.0 8.9   Liver Function Tests No results for input(s): AST, ALT, ALKPHOS, BILITOT, PROT, ALBUMIN in the last 72 hours. No results for input(s): LIPASE, AMYLASE in the last 72 hours. Cardiac Enzymes No results for input(s): CKTOTAL, CKMB, CKMBINDEX, TROPONINI in the last 72 hours.  BNP: BNP (last 3 results) Recent Labs    04/19/18 1233  BNP 1,549.5*    ProBNP (last 3 results) No results for input(s): PROBNP in the last 8760 hours.   D-Dimer No results for input(s): DDIMER in the last 72 hours. Hemoglobin A1C No results for input(s): HGBA1C in the last 72 hours. Fasting Lipid Panel No results for input(s): CHOL, HDL, LDLCALC, TRIG, CHOLHDL, LDLDIRECT in the last 72 hours. Thyroid Function Tests No results for input(s): TSH, T4TOTAL, T3FREE, THYROIDAB in the last 72 hours.  Invalid input(s): FREET3  Other results:   Imaging     No results found.   Medications:     Scheduled Medications: . buPROPion  150 mg Oral Daily  . carvedilol  3.125 mg Oral BID WC  . DULoxetine  60 mg Oral Daily  . enoxaparin (LOVENOX) injection  40 mg Subcutaneous Q24H  . feeding supplement (ENSURE ENLIVE)  237 mL Oral Q24H  . furosemide  40 mg Oral BID  . gatifloxacin  1 drop Right Eye QID  . losartan  50 mg Oral Daily  . nicotine  14 mg Transdermal Daily  . polyethylene glycol  17 g Oral BID  . prednisoLONE acetate  1 drop Right Eye QID  . primidone  50 mg Oral QHS  . QUEtiapine  25 mg Oral QHS  . senna  1 tablet Oral Daily  . sodium chloride flush  3 mL Intravenous Q12H  . sodium chloride flush  3 mL Intravenous Q12H  . tamsulosin  0.4 mg Oral QPC supper  . tiZANidine  4 mg Oral QHS  . vitamin C  1,000 mg Oral Daily     Infusions: . sodium chloride    . sodium chloride 900 mL (04/27/18 0504)  . sodium chloride    . sodium chloride        PRN Medications:  sodium chloride, sodium chloride, acetaminophen, clonazePAM, guaiFENesin-dextromethorphan, ondansetron (ZOFRAN) IV, phenol, sodium chloride, sodium chloride flush, sodium chloride flush    Patient Profile   Travis Reese is a 72 year old with a history of HTN, tobacco abuse, aortic insufficiency, and diastolic heart failure.   CT consulted for AI. CT requested HF team to optimize prior to surgery.   Assessment/Plan  1. Dyspnea - Multifactorial with AI/diastoic hf. - CTA with emphysema . Small pleural effusion and patchy density RUL RML. WBC 12.5 this am. Tmax 99.3.  - Dyspnea resolved.   2. Aortic Insufficiency ECHO with AV severe regurgitation  - CT surgery consulted.  - TEE and RHC/LHC today.  - Blood cultures NGTD - UDS positive for barbituates  3 . A/C Diastolic HF EF 93%. Aortic Valve with severe regurgitation. - Volume status stable on exam. - Now on PO lasix 40 mg BID. May need to adjusted based on RHC results.  - Going for [facility] today.   4. Smoker  - Encouraged cessation.   5. Hyperkalemia - BMET pending.    6. Hyponatremia  - Sodium 128> 129>126>124>pending today. May need dose of tolvaptan.  - Limit free water.    7. Emphysema - Noted on CTA. No change.   Medication concerns reviewed with patient and pharmacy team. Barriers identified: none    Length of Stay: [place], NP  04/27/2018, 7:32 AM  Advanced Heart Failure Team Pager [phone] (M-F; 7a - 4p)  Please contact [facility] for night-coverage after hours (4p -7a ) and weekends on [email or web address]  Patient seen and examined with the above-signed Advanced Practice Provider and/or Housestaff. I personally reviewed laboratory data, imaging studies and relevant notes. I independently examined the patient and formulated the important aspects of the plan. I have edited the note to reflect any of my changes or salient points. I have personally discussed the plan with  the patient and/or family.  Volume status much improved. Now appears euvolemic. Has cough productive of white sputum and low grade temps. WBC rising. Given need for probable AVR would cover with abx. Serum sodium is quite low. Free water restrict. May need a dose of tolvaptan. Discussed TEE and RHC/LHC procedures for today.   Glori Bickers, MD  1:13 PM

## 2018-04-27 NOTE — Progress Notes (Addendum)
Advanced Heart Failure Rounding Note  PCP-Cardiologist: No primary care provider on file.   Subjective:    Switched to PO lasix yesterday. Sluggish UOP. Weight unchanged. BMET pending.   WBC trending up, 12.5. Tmax 99.3. He has a productive cough with clear sputum. No chills or wheeze  Feels less swollen. Denies CP, SOB, orthopnea. Walking in hallways with no problems.   Going for Travis Reese and TEE today.    Objective:   Weight Range: 91.1 kg Body mass index is 26.49 kg/m.   Vital Signs:   Temp:  [98 F (36.7 C)-99.3 F (37.4 C)] 99.2 F (37.3 C) (08/26 0603) Pulse Rate:  [85-102] 102 (08/26 0603) Resp:  [16-18] 16 (08/26 0603) BP: (100-118)/(48-67) 118/50 (08/26 0603) SpO2:  [91 %-100 %] 91 % (08/26 0603) Weight:  [91.1 kg] 91.1 kg (08/26 0603) Last BM Date: 04/26/18  Weight change: Filed Weights   04/25/18 0408 04/26/18 0416 04/27/18 0603  Weight: 91.4 kg 91.2 kg 91.1 kg    Intake/Output:   Intake/Output Summary (Last 24 hours) at 04/27/2018 0732 Last data filed at 04/27/2018 0504 Gross per 24 hour  Intake 720 ml  Output 1000 ml  Net -280 ml      Physical Exam    General:  Well appearing. No resp difficulty. Sitting on side of bed.  HEENT: Normal Neck: Supple. JVP 6-7  Carotids 2+ bilat; no bruits. No lymphadenopathy or thyromegaly appreciated. Cor: PMI nondisplaced. Regular rate & rhythm. 2/6 AI. Lungs: diminished throughout. Fine crackles left base  Abdomen: Soft, nontender, nondistended. No hepatosplenomegaly. No bruits or masses. Good bowel sounds. Extremities: no cyanosis, clubbing, rash, edema Neuro: alert & oriented x 3, cranial nerves grossly intact. moves all 4 extremities w/o difficulty. Affect pleasant   Telemetry   NSR 80-90s. Personally reviewed.   EKG    No new tracings.   Labs    CBC Recent Labs    04/25/18 0413  WBC 9.0  HGB 14.3  HCT 40.7  MCV 99.5  PLT 366   Basic Metabolic Panel Recent Labs    04/25/18 0413  04/26/18 0608  NA 126* 124*  K 3.7 3.6  CL 86* 84*  CO2 30 29  GLUCOSE 111* 106*  BUN 15 13  CREATININE 0.80 0.73  CALCIUM 9.0 8.9   Liver Function Tests No results for input(s): AST, ALT, ALKPHOS, BILITOT, PROT, ALBUMIN in the last 72 hours. No results for input(s): LIPASE, AMYLASE in the last 72 hours. Cardiac Enzymes No results for input(s): CKTOTAL, CKMB, CKMBINDEX, TROPONINI in the last 72 hours.  BNP: BNP (last 3 results) Recent Labs    04/19/18 1233  BNP 1,549.5*    ProBNP (last 3 results) No results for input(s): PROBNP in the last 8760 hours.   D-Dimer No results for input(s): DDIMER in the last 72 hours. Hemoglobin A1C No results for input(s): HGBA1C in the last 72 hours. Fasting Lipid Panel No results for input(s): CHOL, HDL, LDLCALC, TRIG, CHOLHDL, LDLDIRECT in the last 72 hours. Thyroid Function Tests No results for input(s): TSH, T4TOTAL, T3FREE, THYROIDAB in the last 72 hours.  Invalid input(s): FREET3  Other results:   Imaging     No results found.   Medications:     Scheduled Medications: . buPROPion  150 mg Oral Daily  . carvedilol  3.125 mg Oral BID WC  . DULoxetine  60 mg Oral Daily  . enoxaparin (LOVENOX) injection  40 mg Subcutaneous Q24H  . feeding supplement (ENSURE ENLIVE)  237 mL Oral Q24H  . furosemide  40 mg Oral BID  . gatifloxacin  1 drop Right Eye QID  . losartan  50 mg Oral Daily  . nicotine  14 mg Transdermal Daily  . polyethylene glycol  17 g Oral BID  . prednisoLONE acetate  1 drop Right Eye QID  . primidone  50 mg Oral QHS  . QUEtiapine  25 mg Oral QHS  . senna  1 tablet Oral Daily  . sodium chloride flush  3 mL Intravenous Q12H  . sodium chloride flush  3 mL Intravenous Q12H  . tamsulosin  0.4 mg Oral QPC supper  . tiZANidine  4 mg Oral QHS  . vitamin C  1,000 mg Oral Daily     Infusions: . sodium chloride    . sodium chloride 900 mL (04/27/18 0504)  . sodium chloride    . sodium chloride        PRN Medications:  sodium chloride, sodium chloride, acetaminophen, clonazePAM, guaiFENesin-dextromethorphan, ondansetron (ZOFRAN) IV, phenol, sodium chloride, sodium chloride flush, sodium chloride flush    Patient Profile   [name] is a 72 year old with a history of HTN, tobacco abuse, aortic insufficiency, and diastolic heart failure.   CT consulted for AI. CT requested HF team to optimize prior to surgery.   Assessment/Plan  1. Dyspnea - Multifactorial with AI/diastoic hf. - CTA with emphysema . Small pleural effusion and patchy density RUL RML. WBC 12.5 this am. Tmax 99.3.  - Dyspnea resolved.   2. Aortic Insufficiency ECHO with AV severe regurgitation  - CT surgery consulted.  - TEE and RHC/LHC today.  - Blood cultures NGTD - UDS positive for barbituates  3 . A/C Diastolic HF EF 15%. Aortic Valve with severe regurgitation. - Volume status stable on exam. - Now on PO lasix 40 mg BID. May need to adjusted based on RHC results.  - Going for Travis Reese today.   4. Smoker  - Encouraged cessation.   5. Hyperkalemia - BMET pending.    6. Hyponatremia  - Sodium 128> 129>126>124>pending today. May need dose of tolvaptan.  - Limit free water.    7. Emphysema - Noted on CTA. No change.   Medication concerns reviewed with patient and pharmacy team. Barriers identified: none    Length of Stay: Onset, NP  04/27/2018, 7:32 AM  Advanced Heart Failure Team Pager [phone] (M-F; 7a - 4p)  Please contact [facility] for night-coverage after hours (4p -7a ) and weekends on [email or web address]  Patient seen and examined with the above-signed Advanced Practice Provider and/or Housestaff. I personally reviewed laboratory data, imaging studies and relevant notes. I independently examined the patient and formulated the important aspects of the plan. I have edited the note to reflect any of my changes or salient points. I have personally discussed the plan with  the patient and/or family.  Volume status much improved. Now appears euvolemic. Has cough productive of white sputum and low grade temps. WBC rising. Given need for probable AVR would cover with abx. Serum sodium is quite low. Free water restrict. May need a dose of tolvaptan. Discussed TEE and RHC/LHC procedures for today.   Glori Bickers, MD  1:13 PM

## 2018-04-27 NOTE — Progress Notes (Signed)
Pharmacy Antibiotic Note  Travis Reese is a 72 y.o. male admitted on 04/19/2018 with CHF.  Pharmacy has been consulted for cefepime dosing with concern for PNA. White count up, Cr stable.  Plan: Cefepime 2g IV q12h F/U LOT, renal funx, cultures  Height: 6\' 1"  (185.4 cm) Weight: 200 lb 12.8 oz (91.1 kg)(scale c) IBW/kg (Calculated) : 79.9  Temp (24hrs), Avg:99.2 F (37.3 C), Min:99 F (37.2 C), Max:99.3 F (37.4 C)  Recent Labs  Lab 04/22/18 0409 04/23/18 0507 04/24/18 0542 04/25/18 0413 04/26/18 0608 04/27/18 0620  WBC 9.0 7.5  --  9.0  --  12.5*  CREATININE 0.84 0.83 0.86 0.80 0.73 0.67    Estimated Creatinine Clearance: 98.5 mL/min (by C-G formula based on SCr of 0.67 mg/dL).    No Known Allergies  Antimicrobials this admission: 8/26 Cefepime >>   Dose adjustments this admission: none  Microbiology results: 8/22 BCx: NGTD   Thank you for allowing pharmacy to be a part of this patient's care.  Arrie Senate, PharmD, BCPS Clinical Pharmacist [phone] Please check AMION for all [place] numbers 04/27/2018

## 2018-04-27 NOTE — Progress Notes (Signed)
CARDIAC REHAB PHASE I   PRE:  Rate/Rhythm: 86 SR  BP:  Sitting: 117/62      SaO2: 92 RA  MODE:  Ambulation: 200 ft 104 peak HR  POST:  Rate/Rhythm: 95 SR  BP:  Sitting: 131/60    SaO2: 93 RA   Pt ambulated 273ft in hallway standby assist with slightly unsteady gait. Pt denies SOB, does have some CP, pt believes is from coughing. Pt given CHF booklet. Reviewed importance of daily weights, and zone tool. Pt for cath today to determine plan. Will continue to follow.  [id number] Rufina Falco, RN BSN 04/27/2018 9:46 AM

## 2018-04-27 NOTE — Progress Notes (Signed)
  Echocardiogram Echocardiogram Transesophageal has been performed.  Travis Reese 04/27/2018, 2:56 PM

## 2018-04-27 NOTE — Interval H&P Note (Signed)
History and Physical Interval Note:  04/27/2018 2:56 PM  Travis Reese  has presented today for surgery, with the diagnosis of cp  The various methods of treatment have been discussed with the patient and family. After consideration of risks, benefits and other options for treatment, the patient has consented to  Procedure(s): RIGHT/LEFT HEART CATH AND CORONARY ANGIOGRAPHY (N/A) as a surgical intervention .  The patient's history has been reviewed, patient examined, no change in status, stable for surgery.  I have reviewed the patient's chart and labs.  Questions were answered to the patient's satisfaction.     Daniel Bensimhon

## 2018-04-27 NOTE — Progress Notes (Signed)
Pre op Doppler prelim:   Right Carotid:Velocities in the right ICA are consistent with a 1-39% stenosis.  Left Carotid: Velocities in the left ICA are consistent with a 1-39% stenosis.    Right Upper Extremity: Doppler waveforms decrease >50% with right radial compression. Doppler waveforms remain within normal limits with right ulnar compression.  Left Upper Extremity: Doppler waveforms decrease >50% with left radial compression. Doppler waveforms decrease >50% with left ulnar compression.   Landry Mellow, RDMS, RVT

## 2018-04-27 NOTE — Interval H&P Note (Signed)
History and Physical Interval Note:  04/27/2018 2:24 PM  Travis Reese  has presented today for surgery, with the diagnosis of AORTIC INSUFFICIENCY  The various methods of treatment have been discussed with the patient and family. After consideration of risks, benefits and other options for treatment, the patient has consented to  Procedure(s): TRANSESOPHAGEAL ECHOCARDIOGRAM (TEE) (N/A) as a surgical intervention .  The patient's history has been reviewed, patient examined, no change in status, stable for surgery.  I have reviewed the patient's chart and labs.  Questions were answered to the patient's satisfaction.     [name]

## 2018-04-27 NOTE — Progress Notes (Addendum)
PROGRESS NOTE  Travis Reese DJS:[id number] DOB: 08/09/1946 DOA: 04/19/2018 PCP: Janine Limbo, PA-C   LOS: 8 days   Brief Narrative / Interim history: Alease Medina Flowersis a 72 y.o.malewith medical history significant ofCAD, hypertension, BPH who comes in with shortness of breath, dyspnea on exertion. Patient reports that for the past 2 to 3 weeks he has been having increasing dyspnea on exertion and shortness of breath. He has noted some trace lower extremity edema and possibly some orthopnea but no clear symptoms. He was admitted from 04/12/2018 to 04/14/2018 at [facility] with shortness of breath, records reviewed and he responded to diuresis, had a 2D echo which was found to have severe aortic regurgitation and plans were for patient to have outpatient referral to cardiothoracic surgery.  He was discharged in improved condition but over the few days of being home he continued to be progressively short of breath and was admitted to the hospital again.   Assessment & Plan: Principal Problem:   Severe aortic insufficiency Active Problems:   HTN (hypertension)   Acute CHF (congestive heart failure) (HCC)   BPH (benign prostatic hyperplasia)   Anxiety   Acute hypoxic respiratory failure due to pulmonary edema in the setting of severe aortic regurgitation -2D echo done 8/21 showed severe aortic regurgitation -Cardiology and cardiothoracic surgery consulted, plan for TEE and cardiac cath today and probable surgery later in the week -Respiratory status improving, chest x-ray over the weekend without infiltrates, cough is getting better.  Slight leukocytosis and low-grade temp of 99 noted, but clinically without active infection. Discussed with cardiology Dr. Missy Sabins, given high risk patient and valve surgery planned for later this week, will start empiric antibiotics with Cefepime.   Hyponatremia -Stable, no fluctuations.  On Lasix.   BPH  -Continue Flomax, no issues and  urinating well  Hypertension -Continue Coreg, Cozaar, blood pressure normal today  Depression/anxiety -Continue bupropion, clonazepam, Seroquel, primidone and duloxetine  Tobacco abuse -Nicotine patches   DVT prophylaxis: Lovenox Code Status: Full code Family Communication: no family at bedside Disposition Plan: home when ready   Consultants:   Cardiology   Cardiothoracic surgery   Procedures:   2D echo Study Conclusions - Left ventricle: The cavity size was normal. Wall thickness was increased in a pattern of mild LVH. Systolic function was normal. The estimated ejection fraction was in the range of 50% to 55%. Diffuse hypokinesis. The study is not technically sufficient to allow evaluation of LV diastolic function. - Aortic valve: There was severe regurgitation. - Left atrium: Volume/bsa, ES (1-plane Simpson&'s, A4C): 34.3 ml/m^2.  Antimicrobials:  None    Subjective: -Feels well, breathing is improved, still coughing with minimal white frothy sputum, denies any green or yellow discoloration  Objective: Vitals:   04/26/18 2014 04/27/18 0037 04/27/18 0037 04/27/18 0603  BP: 100/67 (!) 101/55  (!) 118/50  Pulse: 89 86  (!) 102  Resp: 16 18  16   Temp: 99.3 F (37.4 C)  99 F (37.2 C) 99.2 F (37.3 C)  TempSrc: Oral  Oral Oral  SpO2: 95% 93%  91%  Weight:    91.1 kg  Height:        Intake/Output Summary (Last 24 hours) at 04/27/2018 1119 Last data filed at 04/27/2018 1005 Gross per 24 hour  Intake 582.19 ml  Output 1175 ml  Net -592.81 ml   Filed Weights   04/25/18 0408 04/26/18 0416 04/27/18 0603  Weight: 91.4 kg 91.2 kg 91.1 kg    Examination:  Constitutional:  He is in no distress, calm Eyes: No scleral icterus ENMT: Moist mucous membranes Respiratory: Overall clear, moves air well, no wheezing heard.  Normal respiratory effort Cardiovascular: Regular rate and rhythm, no murmurs heard.  No peripheral edema. Abdomen: Soft, nontender, nondistended,  bowel sounds positive Neurologic: Nonfocal   Data Reviewed: I have independently reviewed following labs and imaging studies   CBC: Recent Labs  Lab 04/22/18 0409 04/23/18 0507 04/25/18 0413 04/27/18 0620  WBC 9.0 7.5 9.0 12.5*  HGB 14.0 13.6 14.3 13.6  HCT 40.0 39.0 40.7 37.7*  MCV 101.0* 101.3* 99.5 97.9  PLT 224 200 228 500   Basic Metabolic Panel: Recent Labs  Lab 04/23/18 0507 04/24/18 0542 04/25/18 0413 04/26/18 0608 04/27/18 0620  NA 128* 129* 126* 124* 124*  K 5.6* 3.8 3.7 3.6 3.6  CL 89* 87* 86* 84* 84*  CO2 32 30 30 29 27   GLUCOSE 100* 138* 111* 106* 113*  BUN 17 14 15 13 13   CREATININE 0.83 0.86 0.80 0.73 0.67  CALCIUM 8.7* 9.2 9.0 8.9 8.9  MG  --  1.9  --   --  1.7   GFR: Estimated Creatinine Clearance: 98.5 mL/min (by C-G formula based on SCr of 0.67 mg/dL). Liver Function Tests: No results for input(s): AST, ALT, ALKPHOS, BILITOT, PROT, ALBUMIN in the last 168 hours. No results for input(s): LIPASE, AMYLASE in the last 168 hours. No results for input(s): AMMONIA in the last 168 hours. Coagulation Profile: No results for input(s): INR, PROTIME in the last 168 hours. Cardiac Enzymes: No results for input(s): CKTOTAL, CKMB, CKMBINDEX, TROPONINI in the last 168 hours. BNP (last 3 results) No results for input(s): PROBNP in the last 8760 hours. HbA1C: No results for input(s): HGBA1C in the last 72 hours. CBG: No results for input(s): GLUCAP in the last 168 hours. Lipid Profile: No results for input(s): CHOL, HDL, LDLCALC, TRIG, CHOLHDL, LDLDIRECT in the last 72 hours. Thyroid Function Tests: No results for input(s): TSH, T4TOTAL, FREET4, T3FREE, THYROIDAB in the last 72 hours. Anemia Panel: No results for input(s): VITAMINB12, FOLATE, FERRITIN, TIBC, IRON, RETICCTPCT in the last 72 hours. Urine analysis:    Component Value Date/Time   COLORURINE STRAW (A) 04/19/2018 2159   APPEARANCEUR CLEAR 04/19/2018 2159   LABSPEC 1.004 (L) 04/19/2018 2159     PHURINE 7.0 04/19/2018 2159   GLUCOSEU NEGATIVE 04/19/2018 2159   HGBUR NEGATIVE 04/19/2018 2159   [place] NEGATIVE 04/19/2018 2159   [place] NEGATIVE 04/19/2018 2159   PROTEINUR NEGATIVE 04/19/2018 2159   NITRITE NEGATIVE 04/19/2018 2159   LEUKOCYTESUR NEGATIVE 04/19/2018 2159   Sepsis Labs: Invalid input(s): PROCALCITONIN, LACTICIDVEN  Recent Results (from the past 240 hour(s))  Culture, blood (Routine X 2) w Reflex to ID Panel     Status: None (Preliminary result)   Collection Time: 04/23/18  6:20 PM  Result Value Ref Range Status   Specimen Description BLOOD RIGHT HAND  Final   Special Requests   Final    BOTTLES DRAWN AEROBIC ONLY Blood Culture adequate volume   Culture   Final    NO GROWTH 4 DAYS Performed at [facility], [address] [place], [place] [address]    Report Status PENDING  Incomplete  Culture, blood (Routine X 2) w Reflex to ID Panel     Status: None (Preliminary result)   Collection Time: 04/23/18  6:25 PM  Result Value Ref Range Status   Specimen Description BLOOD RIGHT ANTECUBITAL  Final   Special Requests  Final    BOTTLES DRAWN AEROBIC ONLY Blood Culture adequate volume   Culture   Final    NO GROWTH 4 DAYS Performed at [facility], [place] [address], [place], [place] [address]    Report Status PENDING  Incomplete      Radiology Studies: No results found.   Scheduled Meds: . buPROPion  150 mg Oral Daily  . carvedilol  3.125 mg Oral BID WC  . DULoxetine  60 mg Oral Daily  . enoxaparin (LOVENOX) injection  40 mg Subcutaneous Q24H  . feeding supplement (ENSURE ENLIVE)  237 mL Oral Q24H  . furosemide  40 mg Oral BID  . gatifloxacin  1 drop Right Eye QID  . losartan  50 mg Oral Daily  . nicotine  14 mg Transdermal Daily  . polyethylene glycol  17 g Oral BID  . prednisoLONE acetate  1 drop Right Eye QID  . primidone  50 mg Oral QHS  . QUEtiapine  25 mg Oral QHS  . senna  1 tablet Oral Daily  . sodium chloride flush  3  mL Intravenous Q12H  . sodium chloride flush  3 mL Intravenous Q12H  . tamsulosin  0.4 mg Oral QPC supper  . tiZANidine  4 mg Oral QHS  . vitamin C  1,000 mg Oral Daily   Continuous Infusions: . sodium chloride    . sodium chloride Stopped (04/27/18 1005)  . sodium chloride    . sodium chloride    . magnesium sulfate 1 - 4 g bolus IVPB 4 g (04/27/18 1002)   Marzetta Board, MD, PhD [facility] Pager [phone]  If 7PM-7AM, please contact night-coverage [email or web address] Password [email or web address] 04/27/2018, 11:19 AM

## 2018-04-28 ENCOUNTER — Encounter (HOSPITAL_COMMUNITY): Payer: Self-pay | Admitting: Internal Medicine

## 2018-04-28 LAB — BASIC METABOLIC PANEL
Anion gap: 12 (ref 5–15)
BUN: 15 mg/dL (ref 8–23)
CO2: 28 mmol/L (ref 22–32)
Calcium: 8.7 mg/dL — ABNORMAL LOW (ref 8.9–10.3)
Chloride: 87 mmol/L — ABNORMAL LOW (ref 98–111)
Creatinine, Ser: 0.73 mg/dL (ref 0.61–1.24)
GFR calc Af Amer: >60 mL/min (ref >60)
GFR calc non Af Amer: >60 mL/min (ref >60)
Glucose, Bld: 106 mg/dL — ABNORMAL HIGH (ref 70–99)
Potassium: 3.5 mmol/L (ref 3.5–5.1)
Sodium: 127 mmol/L — ABNORMAL LOW (ref 135–145)

## 2018-04-28 LAB — PROCALCITONIN: Procalcitonin: <0.1 ng/mL

## 2018-04-28 LAB — CBC
HCT: 35.1 % — ABNORMAL LOW (ref 39.0–52.0)
Hemoglobin: 12.5 g/dL — ABNORMAL LOW (ref 13.0–17.0)
MCH: 35 pg — ABNORMAL HIGH (ref 26.0–34.0)
MCHC: 35.6 g/dL (ref 30.0–36.0)
MCV: 98.3 fL (ref 78.0–100.0)
Platelets: 220 10*3/uL (ref 150–400)
RBC: 3.57 MIL/uL — ABNORMAL LOW (ref 4.22–5.81)
RDW: 12.5 % (ref 11.5–15.5)
WBC: 9.3 10*3/uL (ref 4.0–10.5)

## 2018-04-28 LAB — CULTURE, BLOOD (ROUTINE X 2)
Culture: NO GROWTH
Culture: NO GROWTH
Special Requests: ADEQUATE
Special Requests: ADEQUATE

## 2018-04-28 LAB — MAGNESIUM: Magnesium: 1.9 mg/dL (ref 1.7–2.4)

## 2018-04-28 MED ORDER — SODIUM CHLORIDE 0.9 % IV BOLUS
500.0000 mL | Freq: Once | INTRAVENOUS | Status: AC
  Administered 2018-04-28: 500 mL via INTRAVENOUS

## 2018-04-28 MED ORDER — POTASSIUM CHLORIDE CRYS ER 20 MEQ PO TBCR
20.0000 meq | EXTENDED_RELEASE_TABLET | Freq: Once | ORAL | Status: AC
  Administered 2018-04-28: 20 meq via ORAL
  Filled 2018-04-28: qty 1

## 2018-04-28 MED ORDER — ADULT MULTIVITAMIN W/MINERALS CH
1.0000 | ORAL_TABLET | Freq: Every day | ORAL | Status: DC
  Administered 2018-04-28 – 2018-04-30 (×3): 1 via ORAL
  Filled 2018-04-28 (×3): qty 1

## 2018-04-28 NOTE — Progress Notes (Signed)
To the best of my knowledge, documentation by Shawnie Pons, [employer] nursing student, is correct.

## 2018-04-28 NOTE — Progress Notes (Signed)
   04/28/18 0202  Vitals  BP (!) 95/53  MAP (mmHg) 66  BP Location Left Arm  BP Method Automatic  Patient Position (if appropriate) Lying  Pulse Rate 88  Pulse Rate Source Monitor  Resp 14  Oxygen Therapy  SpO2 95 %  O2 Device Room Air  Bolus completed. Will continue to monitor patient.

## 2018-04-28 NOTE — Progress Notes (Signed)
Nutrition Follow-up  DOCUMENTATION CODES:   Not applicable  INTERVENTION:   -Continue Ensure Enlive po daily, each supplement provides 350 kcal and 20 grams of protein -MVI with minerals daily  NUTRITION DIAGNOSIS:   Increased nutrient needs related to chronic illness(CHF) as evidenced by estimated needs.  Ongoing  GOAL:   Patient will meet greater than or equal to 90% of their needs  Progressing  MONITOR:   PO intake, Supplement acceptance, Labs, Weight trends, Skin, I & O's  REASON FOR ASSESSMENT:   Malnutrition Screening Tool    ASSESSMENT:   Travis Reese is a 72 y.o. male with medical history significant of CAD, hypertension, BPH who comes in with shortness of breath, dyspnea on exertion.   8/26- s/p TEE and rt/lt heart cath and coronary angiography  Pt out of room at time of visit. Observed pt;s breakfast meal tray- pt consumed about 75% of meal (consumed 100% of hot items, but had not eaten cereal or milk). Pt with good meal intake PO: 40-100%. He is also consuming Ensure supplements per MAR.   Wt has been stable since admission.   Per CVTS notes, pt will likely need further cardiac surgery, but cardiology to optimize.   Labs reviewed: Na: 127  Diet Order:   Diet Order            Diet 2 gram sodium Room service appropriate? Yes; Fluid consistency: Thin  Diet effective now              EDUCATION NEEDS:   Education needs have been addressed  Skin:  Skin Assessment: Reviewed RN Assessment  Last BM:  04/27/18  Height:   Ht Readings from Last 1 Encounters:  04/19/18 6\' 1"  (1.854 m)    Weight:   Wt Readings from Last 1 Encounters:  04/28/18 91.8 kg    Ideal Body Weight:  83.6 kg  BMI:  Body mass index is 26.69 kg/m.  Estimated Nutritional Needs:   Kcal:  1900-2100  Protein:  90-105 grams  Fluid:  1.9-2.1 L    Hollie Bartus A. Jimmye Norman, RD, LDN, CDE Pager: [phone] After hours Pager: [phone]

## 2018-04-28 NOTE — Progress Notes (Signed)
[address]       [place] [address]             [phone]     CARDIOTHORACIC SURGERY PROGRESS NOTE  1 Day Post-Op  S/P Procedure(s) (LRB): RIGHT/LEFT HEART CATH AND CORONARY ANGIOGRAPHY (N/A)  Subjective: Feels well.  No complaints  Objective: Vital signs in last 24 hours: Temp:  [98.3 F (36.8 C)-98.9 F (37.2 C)] 98.5 F (36.9 C) (08/27 1717) Pulse Rate:  [75-99] 91 (08/27 1717) Cardiac Rhythm: Normal sinus rhythm (08/27 1100) Resp:  [12-18] 18 (08/27 1200) BP: (83-126)/(36-64) 126/59 (08/27 1717) SpO2:  [92 %-98 %] 97 % (08/27 1717) Weight:  [91.8 kg] 91.8 kg (08/27 0557)  Physical Exam:  Rhythm:   sinus  Breath sounds: clear  Heart sounds:  RRR w/ holodiastolic murmur  Incisions:  n/a  Abdomen:  Soft, non-distended, non-tender  Extremities:  Warm, well-perfused, no edema   Intake/Output from previous day: 08/26 0701 - 08/27 0700 In: 1776.1 [P.O.:830; I.V.:146.5; IV Piggyback:799.5] Out: 1325 [Urine:1325] Intake/Output this shift: Total I/O In: 723 [P.O.:717; I.V.:6] Out: 300 [Urine:300]  Lab Results: Recent Labs    04/27/18 0620 04/28/18 0433  WBC 12.5* 9.3  HGB 13.6 12.5*  HCT 37.7* 35.1*  PLT 229 220   BMET:  Recent Labs    04/27/18 0620 04/28/18 0433  NA 124* 127*  K 3.6 3.5  CL 84* 87*  CO2 27 28  GLUCOSE 113* 106*  BUN 13 15  CREATININE 0.67 0.73  CALCIUM 8.9 8.7*    CBG (last 3)  No results for input(s): GLUCAP in the last 72 hours. PT/INR:  No results for input(s): LABPROT, INR in the last 72 hours.  CXR:  CHEST - 2 VIEW  COMPARISON:  04/19/2018 and prior radiographs  FINDINGS: Cardiomegaly and COPD/emphysema changes again noted. There may be trace pleural effusions present.  There is no evidence of focal airspace disease, pulmonary edema, suspicious pulmonary nodule/mass or pneumothorax.  No acute bony abnormalities are identified.  IMPRESSION: Cardiomegaly, COPD and possible trace  pleural effusions.   Electronically Signed   By: Margarette Canada M.D.   On: 04/25/2018 11:04   Transesophageal Echocardiography  Patient:    Travis Reese, Travis Reese Travis #:       [id number] Study Date: 04/27/2018 Gender:     M Age:        72 Height:     185.4 cm Weight:     91.1 kg BSA:        2.18 m^2 Pt. Status: Room:   PERFORMING   Pierre Bali, MD  ATTENDING    [place], Amy D  Leeroy Bock, Amy D  REFERRING    Darrick Grinder D  SONOGRAPHER  Roseanna Rainbow  cc:  ------------------------------------------------------------------- LV EF: 55% -   60%  ------------------------------------------------------------------- Indications:      Aortic insufficiency 424.1. Severe valvular disease  ------------------------------------------------------------------- History:   PMH:   Congestive heart failure.  Aortic valve disease. Risk factors:  Hypertension.  ------------------------------------------------------------------- Study Conclusions  - Left ventricle: Systolic function was normal. The estimated   ejection fraction was in the range of 55% to 60%. Wall motion was   normal; there were no regional wall motion abnormalities. - Aortic valve: Right coronary, left coronary, and noncoronary cusp   prolapse. No evidence of vegetation. There was severe   regurgitation directed centrally in the LVOT. - Mitral valve: No evidence of vegetation. - Left atrium:  No evidence of thrombus in the atrial cavity or   appendage. - Right atrium: No evidence of thrombus in the atrial cavity or   appendage. - Atrial septum: No defect or patent foramen ovale was identified. - Tricuspid valve: No evidence of vegetation. - Pulmonic valve: No evidence of vegetation.  ------------------------------------------------------------------- Study data:   Study status:  Routine.  Consent:  The risks, benefits, and alternatives to the procedure were explained to the patient and informed consent was  obtained.  Procedure:  Initial setup. The patient was brought to the laboratory. Surface ECG leads were monitored. Sedation. Conscious sedation was administered by cardiology staff. Transesophageal echocardiography. Topical anesthesia was obtained using viscous lidocaine. An adult multiplane transesophageal probe was inserted by the attending cardiologistwithout difficulty. 3D image quality was excellent. Study completion:  The patient tolerated the procedure well. There were no complications.  Administered medications:   Fentanyl, 55mcg, IV.  Midazolam, 3mg , IV.          Diagnostic transesophageal echocardiography.  2D and color Doppler.  Birthdate:  Patient birthdate: September 07, 1945.  Age:  Patient is 72 yr old.  Sex:  Gender: male.    BMI: 26.5 kg/m^2.  Blood pressure:     145/50  Patient status:  Inpatient.  Study date:  Study date: 04/27/2018. Study time: 01:51 PM.  Location:  Endoscopy.  -------------------------------------------------------------------  ------------------------------------------------------------------- Left ventricle:  Systolic function was normal. The estimated ejection fraction was in the range of 55% to 60%. Wall motion was normal; there were no regional wall motion abnormalities.  ------------------------------------------------------------------- Aortic valve:   Trileaflet.  Right coronary, left coronary, and noncoronary cusp prolapse.  No evidence of vegetation.  Doppler: There was no stenosis.   There was severe regurgitation directed centrally in the LVOT.  ------------------------------------------------------------------- Aorta:  Aortic root: The aortic root was normal in size. Ascending aorta: The ascending aorta was normal in size. Descending aorta: The descending aorta was heavily calcified.  ------------------------------------------------------------------- Mitral valve:   Structurally normal valve.   Leaflet separation was normal.  No  evidence of vegetation.  Doppler:  There was trivial regurgitation.  ------------------------------------------------------------------- Left atrium:  The atrium was normal in size.  No evidence of thrombus in the atrial cavity or appendage.  ------------------------------------------------------------------- Atrial septum:  No defect or patent foramen ovale was identified.   ------------------------------------------------------------------- Right ventricle:  The cavity size was normal. Wall thickness was normal. Systolic function was normal.  ------------------------------------------------------------------- Pulmonic valve:    Structurally normal valve.   Cusp separation was normal.  No evidence of vegetation.  ------------------------------------------------------------------- Tricuspid valve:   Structurally normal valve.   Leaflet separation was normal.  No evidence of vegetation.  Doppler:  There was mild regurgitation.  ------------------------------------------------------------------- Right atrium:  The atrium was normal in size.  No evidence of thrombus in the atrial cavity or appendage.  ------------------------------------------------------------------- Pericardium:  There was no pericardial effusion.  ------------------------------------------------------------------- Measurements   Aortic valve                          Value  Aortic regurg pressure half-time      212   ms  Legend: (L)  and  (H)  mark values outside specified reference range.  ------------------------------------------------------------------- Prepared and Electronically Authenticated by  Pierre Bali, MD 2019-08-26T15:03:53                             *[facility]*                   *  [place], [place] [address]                             [phone]  ------------------------------------------------------------------- Transesophageal Echocardiography  (Report amended )  Patient:    Travis Reese, Travis Reese Travis #:       [id number] Study Date: 04/27/2018 Gender:     M Age:        82 Height:     185.4 cm Weight:     91.1 kg BSA:        2.18 m^2 Pt. Status: Room:   PERFORMING   Pierre Bali, MD  ATTENDING    [place], Amy D  Leeroy Bock, Amy D  REFERRING    Darrick Grinder D  SONOGRAPHER  Roseanna Rainbow  cc:  ------------------------------------------------------------------- LV EF: 55% -   60%  ------------------------------------------------------------------- Indications:      Aortic insufficiency 424.1. Severe valvular disease  ------------------------------------------------------------------- History:   PMH:   Congestive heart failure.  Aortic valve disease. Risk factors:  Hypertension.  ------------------------------------------------------------------- Study Conclusions  - Left ventricle: Systolic function was normal. The estimated   ejection fraction was in the range of 55% to 60%. Wall motion was   normal; there were no regional wall motion abnormalities. - Aortic valve: Right coronary cusp prolapse. No evidence of   vegetation. There was severe regurgitation directed centrally in   the LVOT. - Mitral valve: No evidence of vegetation. - Left atrium: No evidence of thrombus in the atrial cavity or   appendage. - Right atrium: No evidence of thrombus in the atrial cavity or   appendage. - Atrial septum: No defect or patent foramen ovale was identified. - Tricuspid valve: No evidence of vegetation. - Pulmonic valve: No evidence of vegetation.  ------------------------------------------------------------------- Study data:   Study status:  Routine.  Consent:  The risks, benefits, and alternatives to the procedure were explained to the patient and informed consent was obtained.  Procedure:   Initial setup. The patient was brought to the laboratory. Surface ECG leads were monitored. Sedation. Conscious sedation was administered by cardiology staff. Transesophageal echocardiography. Topical anesthesia was obtained using viscous lidocaine. An adult multiplane transesophageal probe was inserted by the attending cardiologistwithout difficulty. 3D image quality was excellent. Study completion:  The patient tolerated the procedure well. There were no complications.  Administered medications:   Fentanyl, 54mcg, IV.  Midazolam, 3mg , IV.          Diagnostic transesophageal echocardiography.  2D and color Doppler.  Birthdate:  Patient birthdate: 07/24/1946.  Age:  Patient is 72 yr old.  Sex:  Gender: male.    BMI: 26.5 kg/m^2.  Blood pressure:     145/50  Patient status:  Inpatient.  Study date:  Study date: 04/27/2018. Study time: 01:51 PM.  Location:  Endoscopy.  -------------------------------------------------------------------  ------------------------------------------------------------------- Left ventricle:  Systolic function was normal. The estimated ejection fraction was in the range of 55% to 60%. Wall motion was normal; there were no regional wall motion abnormalities.  ------------------------------------------------------------------- Aortic valve:   Trileaflet.  Right coronary cusp prolapse.  No evidence of vegetation.  Doppler:   There was no  stenosis.   There was severe regurgitation directed centrally in the LVOT.  ------------------------------------------------------------------- Aorta:  Aortic root: The aortic root was normal in size. Ascending aorta: The ascending aorta was normal in size. Descending aorta: The descending aorta was heavily calcified.  ------------------------------------------------------------------- Mitral valve:   Structurally normal valve.   Leaflet separation was normal.  No evidence of vegetation.  Doppler:  There was  trivial regurgitation.  ------------------------------------------------------------------- Left atrium:  The atrium was normal in size.  No evidence of thrombus in the atrial cavity or appendage.  ------------------------------------------------------------------- Atrial septum:  No defect or patent foramen ovale was identified.   ------------------------------------------------------------------- Right ventricle:  The cavity size was normal. Wall thickness was normal. Systolic function was normal.  ------------------------------------------------------------------- Pulmonic valve:    Structurally normal valve.   Cusp separation was normal.  No evidence of vegetation.  ------------------------------------------------------------------- Tricuspid valve:   Structurally normal valve.   Leaflet separation was normal.  No evidence of vegetation.  Doppler:  There was mild regurgitation.  ------------------------------------------------------------------- Right atrium:  The atrium was normal in size.  No evidence of thrombus in the atrial cavity or appendage.  ------------------------------------------------------------------- Pericardium:  There was no pericardial effusion.  ------------------------------------------------------------------- Measurements   Aortic valve                          Value  Aortic regurg pressure half-time      212   ms  Legend: (L)  and  (H)  mark values outside specified reference range.  ------------------------------------------------------------------- Rande Brunt, MD 2019-08-26T15:18:56   RIGHT/LEFT HEART CATH AND CORONARY ANGIOGRAPHY  Conclusion     Prox LAD to Mid LAD lesion is 40% stenosed.    Findings:  Ao = 104/49 (74)  LV = 94/24 RA =  3 RV = 37/6 PA = 38/14 (25) PCW = 17 Fick cardiac output/index = 5.5/2.6 PVR = 1.5 WU Ao sat = 99% PA sat = 69%, 73%  Assessment: 1. 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 Normal LVEF 3. 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 Essentially normal hemodynamics  Plan/Discussion:  Proceed with AVR later this week.   Glori Bickers, MD  4:03 PM     Indications   Severe aortic regurgitation [I35.1 (ICD-10-CM)]  Procedural Details/Technique   Technical Details The risks and indication of the procedure were explained. Consent was signed and placed on the chart. An appropriate timeout was taken prior to the procedure.   The right AC fossa was prepped and draped in the routine sterile fashion and anesthetized with 1% local lidocaine. The pre-existing PIV in the right [facility] was exchanged over a wire for a 5 FR venous sheath using a modified Seldinger technique. A standard Swan-Ganz catheter was used for the procedure.   After a normal Jamen's test was confirmed, the right wrist was prepped and draped in the routine sterile fashion and anesthetized with 1% local lidocaine. A 5 FR arterial sheath was then placed in the right radial artery using a modified Seldinger technique. 3mg  IV verapamil was given through the sheath. Systemic heparin was administered. Standard catheters including a JL 3.5 and a JR 4 were used. All catheter exchanges were made over a wire.   Estimated blood loss <50 mL.  During this procedure the patient was administered the following to achieve and maintain moderate conscious sedation: Versed 1 mg, Fentanyl 25 mcg, while the patient's heart  rate, blood pressure, and oxygen saturation were continuously monitored. The period of conscious sedation was 23 minutes, of which I was present face-to-face 100% of this time.  Coronary Findings   Diagnostic  Dominance: Right  Left Main  Vessel is angiographically normal.  Left Anterior Descending  Prox LAD to Mid LAD lesion 40% stenosed  Prox LAD to Mid LAD lesion is 40% stenosed. The lesion is calcified.  Left Circumflex   Vessel is angiographically normal.  Right Coronary Artery  Vessel is normal in caliber. 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  Intervention   No interventions have been documented.  Wall Motion   Resting       EF 55%          Coronary Diagrams   Diagnostic Diagram       Implants    No implant documentation for this case.  MERGE Images   Show images for CARDIAC CATHETERIZATION   Link to Procedure Log   Procedure Log    Hemo Data    Most Recent Value  RA A Wave 8 mmHg  RA V Wave 5 mmHg  RA Mean 3 mmHg  RV Systolic Pressure 37 mmHg  RV Diastolic Pressure 1 mmHg  RV EDP 6 mmHg  PA Systolic Pressure 38 mmHg  PA Diastolic Pressure 14 mmHg  PA Mean 25 mmHg  PW A Wave 24 mmHg  PW V Wave 20 mmHg  PW Mean 17 mmHg  AO Systolic Pressure 841 mmHg  AO Diastolic Pressure 49 mmHg  AO Mean 74 mmHg  LV Systolic Pressure 94 mmHg  LV Diastolic Pressure 0 mmHg  LV EDP 24 mmHg  AOp Systolic Pressure 324 mmHg  AOp Diastolic Pressure 48 mmHg  AOp Mean Pressure 74 mmHg  LVp Systolic Pressure 98 mmHg  LVp Diastolic Pressure 4 mmHg  LVp EDP Pressure 26 mmHg      Assessment/Plan: S/P Procedure(s) (LRB): RIGHT/LEFT HEART CATH AND CORONARY ANGIOGRAPHY (N/A)  Clinically Travis Reese is doing much better w/ improved SOB and weight down slightly below his admission weight.  Serum sodium remains low but is up slightly.  Renal function stable.  I have personally reviewed the patient's TEE and diagnostic cardiac catheterization performed yesterday.  TEE reveals severe aortic insufficiency.  The aortic valve is trileaflet.  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  There do not appear to be any vegetations nor sign of leaflet perforation.  The aortic annulus is somewhat dilated but not aneurysmal.  Left ventricular function appears normal.  Diagnostic cardiac catheterization is notable for the absence of significant coronary artery disease.   Right heart pressures were mildly elevated.  The patient was counseled at length regarding treatment alternatives for management of severe aortic insufficiency including continued medical therapy versus proceeding with aortic valve repair or replacement in the near future.  The natural history of aortic insufficiency was reviewed, as was long term prognosis with medical therapy alone.  Surgical options were discussed at length including conventional surgical aortic valve replacement through either a full median sternotomy or using minimally invasive techniques.  The possibility of valve repair was discussed including theoretical risks and benefits of valve repair.  Questions about long-term durability following repair was discussed.  The patient's mildly enlarged aortic root was discussed including the possibility that root replacement would be necessary.  Assuming that valve repair is not feasible, discussion was held comparing the relative risks of mechanical valve replacement with need for lifelong anticoagulation  versus use of a bioprosthetic tissue valve and the associated potential for late structural valve deterioration and failure.  This discussion was placed in the context of the patient's particular circumstances, and as a result the patient specifically requests that their valve be replaced using bioprosthetic tissue valve.  The patient understands and accepts all potential associated risks of surgery including but not limited to risk of death, stroke, myocardial infarction, congestive heart failure, respiratory failure, renal failure, pneumonia, bleeding requiring blood transfusion and or reexploration, arrhythmia, heart block or bradycardia requiring permanent pacemaker, aortic dissection or other major vascular complication, pleural effusions or other delayed complications related to continued congestive heart failure, and other late complications related to valve repair or replacement including late  structural valve deterioration and failure, thrombosis, endocarditis, or paravalvular leak.  We tentatively plan to proceed with surgery on Friday, May 01, 2018 for aortic valve repair or replacement via conventional median sternotomy, with possible biological Bent[place] aortic root replacement if necessary.   I spent in excess of 30 minutes during the conduct of this hospital encounter and >50% of this time involved direct face-to-face encounter with the patient for counseling and/or coordination of their care.    Rexene Alberts, MD 04/28/2018 5:18 PM

## 2018-04-28 NOTE — Progress Notes (Signed)
CASE MANAGEMENT  Patient Details  Name: Travis Reese MRN: [id number] Date of Birth: [birth year]  Subjective/Objective:     Severe AS             Action/Plan: 04/28/2018 - CM will continue to follow for progression of care; for upcoming valve surgery; B Pennie Rushing   04/24/2018 - Patient lives at home with his father; PCP:O'Buch, Rosie Fate; has private insurance with [facility] with prescription drug coverage; CM following for progression of care; pt for TEE/ heart Cath;  Expected Discharge Date:    undetermined at this time                Expected Discharge Plan:  [place]  Discharge planning Services  CM Consult  Status of Service:  In process, will continue to follow  Sherrilyn Rist [phone] 04/24/2018, 3:21 PM

## 2018-04-28 NOTE — Progress Notes (Addendum)
PROGRESS NOTE  Travis Reese ZWC:[id number] DOB: 02/01/1946 DOA: 04/19/2018 PCP: Travis Limbo, PA-C   LOS: 9 days   Brief Narrative / Interim history: Travis Medina Flowersis a 72 y.o.malewith medical history significant ofCAD, hypertension, BPH who comes in with shortness of breath, dyspnea on exertion. Patient reports that for the past 2 to 3 weeks he has been having increasing dyspnea on exertion and shortness of breath. He has noted some trace lower extremity edema and possibly some orthopnea but no clear symptoms. He was admitted from 04/12/2018 to 04/14/2018 at [facility] with shortness of breath, records reviewed and he responded to diuresis, had a 2D echo which was found to have severe aortic regurgitation and plans were for patient to have outpatient referral to cardiothoracic surgery.  He was discharged in improved condition but over the few days of being home he continued to be progressively short of breath and was admitted to the hospital again.  Cardiology and cardiothoracic surgery are following with plans for valve repair  Assessment & Plan: Principal Problem:   Severe aortic insufficiency Active Problems:   HTN (hypertension)   Acute CHF (congestive heart failure) (HCC)   BPH (benign prostatic hyperplasia)   Anxiety   Acute hypoxic respiratory failure due to pulmonary edema in the setting of severe aortic regurgitation -2D echo done 8/21 showed severe aortic regurgitation -Cardiology and cardiothoracic surgery consulted, patient underwent TEE and left and right heart cath on 8/26. -Respiratory status improving, chest x-ray over the weekend without infiltrates, cough is getting better.  Slight leukocytosis and low-grade temp of 99 noted on 8/26, but clinically without active infection. Discussed with cardiology Dr. Missy Sabins, given high risk patient and valve surgery planned for later this week, will start empiric antibiotics with Cefepime.   Hyponatremia -Initially  due to fluid overload, diuresed, soft blood pressure last night suggesting slightly dehydrated.  Received a small bolus, sodium improved to 127 this morning   BPH  -Continue Flomax, no issues and urinating well  Hypertension -Continue Coreg, Cozaar, blood pressure normal today but slight hypotension overnight noted  Depression/anxiety -Continue bupropion, clonazepam, Seroquel, primidone and duloxetine  Tobacco abuse -Nicotine patches   DVT prophylaxis: Lovenox Code Status: Full code Family Communication: no family at bedside Disposition Plan: home when ready   Consultants:   Cardiology   Cardiothoracic surgery   Procedures:   2D echo Study Conclusions - Left ventricle: The cavity size was normal. Wall thickness was increased in a pattern of mild LVH. Systolic function was normal. The estimated ejection fraction was in the range of 50% to 55%. Diffuse hypokinesis. The study is not technically sufficient to allow evaluation of LV diastolic function. - Aortic valve: There was severe regurgitation. - Left atrium: Volume/bsa, ES (1-plane Simpson&'s, A4C): 34.3 ml/m^2. R/LHC 04/27/18:  Prox LAD to Mid LAD lesion is 40% stenosed. Findings: Ao = 104/49 (74)  LV = 94/24 RA = 3 RV = 37/6 PA = 38/14 (25) PCW = 17 Fick cardiac output/index = 5.5/2.6 PVR = 1.5 WU Ao sat = 99% PA sat = 69%, 73% Assessment: 1. Mild nonobstructive CAD with 40% calcified mLAD lesion. 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 Normal LVEF 3. Severe AI by echo due to [place] prolapse 4. Essentially normal hemodynamics  TEE Study Conclusions  - Left ventricle: Systolic function was normal. The estimated ejection fraction was in the range of 55% to 60%. Wall motion was normal; there were no regional wall motion abnormalities. - Aortic valve: Right coronary, left  coronary, and noncoronary cusp prolapse. No evidence of vegetation. There was severe regurgitation directed centrally in the LVOT. - Mitral valve:  No evidence of vegetation. - Left atrium: No evidence of thrombus in the atrial cavity or appendage. - Right atrium: No evidence of thrombus in the atrial cavity or  appendage. - Atrial septum: No defect or patent foramen ovale was identified. - Tricuspid valve: No evidence of vegetation. - Pulmonic valve: No evidence of vegetation.   Antimicrobials:  Cefepime 8/26 >>   Subjective: -Feels overall stable, breathing is better, not coughing much.  Complains of left-sided chest pain with coughing.  Objective: Vitals:   04/28/18 0202 04/28/18 0557 04/28/18 0800 04/28/18 0900  BP: (!) 95/53 (!) 101/48 (!) 113/49   Pulse: 88 87 79   Resp: 14 18 12    Temp:  98.9 F (37.2 C)  98.9 F (37.2 C)  TempSrc:  Oral  Oral  SpO2: 95% 92% 98%   Weight:  91.8 kg    Height:        Intake/Output Summary (Last 24 hours) at 04/28/2018 1120 Last data filed at 04/28/2018 1000 Gross per 24 hour  Intake 2069.22 ml  Output 1250 ml  Net 819.22 ml   Filed Weights   04/26/18 0416 04/27/18 0603 04/28/18 0557  Weight: 91.2 kg 91.1 kg 91.8 kg    Examination:  Constitutional: NAD Eyes: no icterus ENMT: mmm, no oropharyngeal erythema  Respiratory: Clear to auscultation bilaterally, no wheezing or crackles heard.  Normal respiratory effort. Cardiovascular: Regular rate and rhythm, no significant murmurs heard.  No peripheral edema Abdomen: Soft, nontender, nondistended, positive bowel sounds Neurologic: Equal strength, nonfocal   Data Reviewed: I have independently reviewed following labs and imaging studies   CBC: Recent Labs  Lab 04/22/18 0409 04/23/18 0507 04/25/18 0413 04/27/18 0620 04/28/18 0433  WBC 9.0 7.5 9.0 12.5* 9.3  HGB 14.0 13.6 14.3 13.6 12.5*  HCT 40.0 39.0 40.7 37.7* 35.1*  MCV 101.0* 101.3* 99.5 97.9 98.3  PLT 224 200 228 229 924   Basic Metabolic Panel: Recent Labs  Lab 04/24/18 0542 04/25/18 0413 04/26/18 0608 04/27/18 0620 04/28/18 0433  NA 129* 126* 124* 124*  127*  K 3.8 3.7 3.6 3.6 3.5  CL 87* 86* 84* 84* 87*  CO2 30 30 29 27 28   GLUCOSE 138* 111* 106* 113* 106*  BUN 14 15 13 13 15   CREATININE 0.86 0.80 0.73 0.67 0.73  CALCIUM 9.2 9.0 8.9 8.9 8.7*  MG 1.9  --   --  1.7 1.9   GFR: Estimated Creatinine Clearance: 98.5 mL/min (by C-G formula based on SCr of 0.73 mg/dL). Liver Function Tests: No results for input(s): AST, ALT, ALKPHOS, BILITOT, PROT, ALBUMIN in the last 168 hours. No results for input(s): LIPASE, AMYLASE in the last 168 hours. No results for input(s): AMMONIA in the last 168 hours. Coagulation Profile: No results for input(s): INR, PROTIME in the last 168 hours. Cardiac Enzymes: No results for input(s): CKTOTAL, CKMB, CKMBINDEX, TROPONINI in the last 168 hours. BNP (last 3 results) No results for input(s): PROBNP in the last 8760 hours. HbA1C: No results for input(s): HGBA1C in the last 72 hours. CBG: No results for input(s): GLUCAP in the last 168 hours. Lipid Profile: No results for input(s): CHOL, HDL, LDLCALC, TRIG, CHOLHDL, LDLDIRECT in the last 72 hours. Thyroid Function Tests: No results for input(s): TSH, T4TOTAL, FREET4, T3FREE, THYROIDAB in the last 72 hours. Anemia Panel: No results for input(s): VITAMINB12, FOLATE, FERRITIN, TIBC, IRON,  RETICCTPCT in the last 72 hours. Urine analysis:    Component Value Date/Time   COLORURINE STRAW (A) 04/19/2018 2159   APPEARANCEUR CLEAR 04/19/2018 2159   LABSPEC 1.004 (L) 04/19/2018 2159   PHURINE 7.0 04/19/2018 2159   GLUCOSEU NEGATIVE 04/19/2018 2159   HGBUR NEGATIVE 04/19/2018 2159   [place] NEGATIVE 04/19/2018 2159   [place] NEGATIVE 04/19/2018 2159   PROTEINUR NEGATIVE 04/19/2018 2159   NITRITE NEGATIVE 04/19/2018 2159   LEUKOCYTESUR NEGATIVE 04/19/2018 2159   Sepsis Labs: Invalid input(s): PROCALCITONIN, LACTICIDVEN  Recent Results (from the past 240 hour(s))  Culture, blood (Routine X 2) w Reflex to ID Panel     Status: None   Collection Time:  04/23/18  6:20 PM  Result Value Ref Range Status   Specimen Description BLOOD RIGHT HAND  Final   Special Requests   Final    BOTTLES DRAWN AEROBIC ONLY Blood Culture adequate volume   Culture   Final    NO GROWTH 5 DAYS Performed at [facility], [address], [place], [place] [address]    Report Status 04/28/2018 FINAL  Final  Culture, blood (Routine X 2) w Reflex to ID Panel     Status: None   Collection Time: 04/23/18  6:25 PM  Result Value Ref Range Status   Specimen Description BLOOD RIGHT ANTECUBITAL  Final   Special Requests   Final    BOTTLES DRAWN AEROBIC ONLY Blood Culture adequate volume   Culture   Final    NO GROWTH 5 DAYS Performed at [facility], [place] [address] [place], [place] [address]    Report Status 04/28/2018 FINAL  Final      Radiology Studies: No results found.   Scheduled Meds: . buPROPion  150 mg Oral Daily  . carvedilol  3.125 mg Oral BID WC  . DULoxetine  60 mg Oral Daily  . enoxaparin (LOVENOX) injection  40 mg Subcutaneous Q24H  . feeding supplement (ENSURE ENLIVE)  237 mL Oral Q24H  . gatifloxacin  1 drop Right Eye QID  . losartan  50 mg Oral Daily  . nicotine  14 mg Transdermal Daily  . polyethylene glycol  17 g Oral BID  . prednisoLONE acetate  1 drop Right Eye QID  . primidone  50 mg Oral QHS  . QUEtiapine  25 mg Oral QHS  . senna  1 tablet Oral Daily  . sodium chloride flush  3 mL Intravenous Q12H  . sodium chloride flush  3 mL Intravenous Q12H  . tamsulosin  0.4 mg Oral QPC supper  . tiZANidine  4 mg Oral QHS  . vitamin C  1,000 mg Oral Daily   Continuous Infusions: . sodium chloride    . sodium chloride    . ceFEPime (MAXIPIME) IV Stopped (04/28/18 0700)   Marzetta Board, MD, PhD [facility] Pager [phone]  If 7PM-7AM, please contact night-coverage [email or web address] Password [email or web address] 04/28/2018, 11:20 AM

## 2018-04-28 NOTE — Progress Notes (Addendum)
Advanced Heart Failure Rounding Note  PCP-Cardiologist: No primary care provider on file.   Subjective:    On PO lasix. Weight up 2 lbs. Received 500 cc bolus overnight for SBP 83. SBP 100s this am.   Na 127, K 3.5, Creatinine 0.73  WBC normal today. PCT <0.10 yesterday. Afebrile.   Denies SOB or CP while ambulating. Having some left substernal CP with coughing and deep breaths. Still has productive cough with clear sputum. He did not feel badly when blood pressure was soft last night. Cath and TEE done yesterday. With severe AI. Non-obstructive CAD. RHC with well compensated filling pressures.   R/LHC 04/27/18:  Prox LAD to Mid LAD lesion is 40% stenosed. Findings: Ao = 104/49 (74)  LV = 94/24 RA =  3 RV = 37/6 PA = 38/14 (25) PCW = 17 Fick cardiac output/index = 5.5/2.6 PVR = 1.5 WU Ao sat = 99% PA sat = 69%, 73% Assessment: 1. Mild nonobstructive CAD with 40% calcified mLAD lesion. 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 Normal LVEF 3. Severe AI by echo due to [place] prolapse 4. Essentially normal hemodynamics Plan/Discussion: Proceed with AVR later this week.   TEE 04/27/18: - Left ventricle: Systolic function was normal. The estimated   ejection fraction was in the range of 55% to 60%. Wall motion was   normal; there were no regional wall motion abnormalities. - Aortic valve: Right coronary, left coronary, and noncoronary cusp   prolapse. No evidence of vegetation. There was severe   regurgitation directed centrally in the LVOT. - Mitral valve: No evidence of vegetation. - Left atrium: No evidence of thrombus in the atrial cavity or   appendage. - Right atrium: No evidence of thrombus in the atrial cavity or   appendage. - Atrial septum: No defect or patent foramen ovale was identified. - Tricuspid valve: No evidence of vegetation. - Pulmonic valve: No evidence of vegetation.  Objective:   Weight Range: 91.8 kg Body mass index is 26.69 kg/m.   Vital  Signs:   Temp:  [98.3 F (36.8 C)-98.9 F (37.2 C)] 98.9 F (37.2 C) (08/27 0557) Pulse Rate:  [0-99] 87 (08/27 0557) Resp:  [0-26] 18 (08/27 0557) BP: (83-152)/(36-70) 101/48 (08/27 0557) SpO2:  [0 %-100 %] 92 % (08/27 0557) Weight:  [91.8 kg] 91.8 kg (08/27 0557) Last BM Date: 04/27/18  Weight change: Filed Weights   04/26/18 0416 04/27/18 0603 04/28/18 0557  Weight: 91.2 kg 91.1 kg 91.8 kg    Intake/Output:   Intake/Output Summary (Last 24 hours) at 04/28/2018 0728 Last data filed at 04/28/2018 0601 Gross per 24 hour  Intake 1676.07 ml  Output 1325 ml  Net 351.07 ml      Physical Exam    General: Lying in bed. No resp difficulty. HEENT: Normal anicteric  Neck: Supple. JVP 6-7. Carotids 2+ bilat; no bruits. No thyromegaly or nodule noted. Cor: PMI nondisplaced. RRR, 2/6 AI Lungs: Clear, diminished throughout. No wheezing.  Abdomen: Soft, non-tender, non-distended, no HSM. No bruits or masses. +BS  Extremities: No cyanosis, clubbing, or rash. R and LLE no edema.  Diffuse ecchymosis on left forearm Neuro: alert & oriented x 3, cranial nerves grossly intact. moves all 4 extremities w/o difficulty. Affect pleasant    Telemetry   NSR 70-80s. Personally reviewed.   EKG    No new tracings.   Labs    CBC Recent Labs    04/27/18 0620 04/28/18 0433  WBC 12.5* 9.3  HGB 13.6  12.5*  HCT 37.7* 35.1*  MCV 97.9 98.3  PLT 229 025   Basic Metabolic Panel Recent Labs    04/27/18 0620 04/28/18 0433  NA 124* 127*  K 3.6 3.5  CL 84* 87*  CO2 27 28  GLUCOSE 113* 106*  BUN 13 15  CREATININE 0.67 0.73  CALCIUM 8.9 8.7*  MG 1.7 1.9   Liver Function Tests No results for input(s): AST, ALT, ALKPHOS, BILITOT, PROT, ALBUMIN in the last 72 hours. No results for input(s): LIPASE, AMYLASE in the last 72 hours. Cardiac Enzymes No results for input(s): CKTOTAL, CKMB, CKMBINDEX, TROPONINI in the last 72 hours.  BNP: BNP (last 3 results) Recent Labs     04/19/18 1233  BNP 1,549.5*    ProBNP (last 3 results) No results for input(s): PROBNP in the last 8760 hours.   D-Dimer No results for input(s): DDIMER in the last 72 hours. Hemoglobin A1C No results for input(s): HGBA1C in the last 72 hours. Fasting Lipid Panel No results for input(s): CHOL, HDL, LDLCALC, TRIG, CHOLHDL, LDLDIRECT in the last 72 hours. Thyroid Function Tests No results for input(s): TSH, T4TOTAL, T3FREE, THYROIDAB in the last 72 hours.  Invalid input(s): FREET3  Other results:   Imaging    No results found.   Medications:     Scheduled Medications: . buPROPion  150 mg Oral Daily  . carvedilol  3.125 mg Oral BID WC  . DULoxetine  60 mg Oral Daily  . enoxaparin (LOVENOX) injection  40 mg Subcutaneous Q24H  . feeding supplement (ENSURE ENLIVE)  237 mL Oral Q24H  . furosemide  40 mg Oral BID  . gatifloxacin  1 drop Right Eye QID  . losartan  50 mg Oral Daily  . nicotine  14 mg Transdermal Daily  . polyethylene glycol  17 g Oral BID  . prednisoLONE acetate  1 drop Right Eye QID  . primidone  50 mg Oral QHS  . QUEtiapine  25 mg Oral QHS  . senna  1 tablet Oral Daily  . sodium chloride flush  3 mL Intravenous Q12H  . sodium chloride flush  3 mL Intravenous Q12H  . tamsulosin  0.4 mg Oral QPC supper  . tiZANidine  4 mg Oral QHS  . vitamin C  1,000 mg Oral Daily    Infusions: . sodium chloride    . sodium chloride    . ceFEPime (MAXIPIME) IV 2 g (04/28/18 0425)    PRN Medications: sodium chloride, sodium chloride, acetaminophen, clonazePAM, guaiFENesin-dextromethorphan, hydrocortisone cream, ondansetron (ZOFRAN) IV, phenol, sodium chloride, sodium chloride flush, sodium chloride flush    Patient Profile   Travis Reese is a 72 year old with a history of HTN, tobacco abuse, aortic insufficiency, and diastolic heart failure.   CT consulted for AI. CT requested HF team to optimize prior to surgery.   Assessment/Plan  1. Dyspnea -  Multifactorial with AI/diastoic hf. - CTA with emphysema . Small pleural effusion and patchy density RUL RML. WBC 9.3, afebrile. PCT <0.10 yesterday. - Dyspnea resolved. He has a cough with clear sputum. Encouraged IS. With elevated WBC and upcoming valve surgery. Abx started yesterday  2. Aortic Insufficiency ECHO with AV severe regurgitation  - CT surgery consulted.  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   3 . A/C  Diastolic HF EF 70%. Aortic Valve with severe regurgitation. - Volume status stable on exam.  - RA 3 on RHC 04/27/18. Received 500 cc bolus overnight for low BP. - Continue PO lasix 40 mg BID. Weight up 2 lbs with cough. Will continue for now since he was NPO yesterday. Creatinine 0.73  4. Smoker  - Encouraged cessation. No change.   5. Hyperkalemia - K 3.5 today.    6. Hyponatremia  - Sodium 128> 129>126>124> 127. - Limit free water.    7. Emphysema - Noted on CTA. No change.   8. Mild nonobstructive CAD - LHC 04/27/18: Prox LAD to Mid LAD lesion is 40% stenosed. - Check lipid panel tomorrow. Consider adding ASA + statin.   Medication concerns reviewed with patient and pharmacy team. Barriers identified: none    Length of Stay: [place], NP  04/28/2018, 7:28 AM  Advanced Heart Failure Team Pager [phone] (M-F; 7a - 4p)  Please contact [facility] for night-coverage after hours (4p -7a ) and weekends on [email or web address]  Cath and TEE done yesterday. Results reviewed with him. Has severe AI due to fractured right coronary cusp of aortic valve with severe prolapse. No evidence of endocarditis. Cath with nonobstructive CAD and well compensated filling pressures. BP low overnight and received IVF. Will hold diuretics for now. Continue abx for possible URI  with rising WBC as he is pending valve surgery. Discussed with TCTS. Will check on need for dental consult. Serum sodium improving.

## 2018-04-28 NOTE — Progress Notes (Signed)
500cc Bolus ordered. Order initiated. Will continue to monitor patient.

## 2018-04-28 NOTE — Progress Notes (Signed)
CARDIAC REHAB PHASE I   Offered to ambulate with pt. Pt declining at this time. Pt encouraged to walk at least twice a day so he doesn't become weak while waiting for surgery. Pt given IS, demonstrated 1625. Encouraged use while waiting to strengthen lungs. Will continue to follow.  [id number] Rufina Falco, RN BSN 04/28/2018 2:51 PM

## 2018-04-28 NOTE — Progress Notes (Signed)
   04/28/18 0021  Vitals  BP (!) 83/42  BP Location Left Arm  BP Method Manual  Travis Reese paged and notified. Awaiting new orders.

## 2018-04-29 DIAGNOSIS — N401 Enlarged prostate with lower urinary tract symptoms: Secondary | ICD-10-CM

## 2018-04-29 LAB — URINALYSIS, COMPLETE (UACMP) WITH MICROSCOPIC
Bacteria, UA: NONE SEEN
Bilirubin Urine: NEGATIVE
Glucose, UA: NEGATIVE mg/dL
Hgb urine dipstick: NEGATIVE
Ketones, ur: NEGATIVE mg/dL
Leukocytes, UA: NEGATIVE
Nitrite: NEGATIVE
Protein, ur: NEGATIVE mg/dL
Specific Gravity, Urine: 1.01 (ref 1.005–1.030)
pH: 6 (ref 5.0–8.0)

## 2018-04-29 LAB — BASIC METABOLIC PANEL
Anion gap: 9 (ref 5–15)
BUN: 15 mg/dL (ref 8–23)
CO2: 26 mmol/L (ref 22–32)
Calcium: 8.5 mg/dL — ABNORMAL LOW (ref 8.9–10.3)
Chloride: 89 mmol/L — ABNORMAL LOW (ref 98–111)
Creatinine, Ser: 0.62 mg/dL (ref 0.61–1.24)
GFR calc Af Amer: >60 mL/min (ref >60)
GFR calc non Af Amer: >60 mL/min (ref >60)
Glucose, Bld: 98 mg/dL (ref 70–99)
Potassium: 4.5 mmol/L (ref 3.5–5.1)
Sodium: 124 mmol/L — ABNORMAL LOW (ref 135–145)

## 2018-04-29 LAB — CBC
HCT: 34.1 % — ABNORMAL LOW (ref 39.0–52.0)
Hemoglobin: 12.1 g/dL — ABNORMAL LOW (ref 13.0–17.0)
MCH: 35 pg — ABNORMAL HIGH (ref 26.0–34.0)
MCHC: 35.5 g/dL (ref 30.0–36.0)
MCV: 98.6 fL (ref 78.0–100.0)
Platelets: 235 10*3/uL (ref 150–400)
RBC: 3.46 MIL/uL — ABNORMAL LOW (ref 4.22–5.81)
RDW: 12.4 % (ref 11.5–15.5)
WBC: 9.1 10*3/uL (ref 4.0–10.5)

## 2018-04-29 LAB — LIPID PANEL
Cholesterol: 110 mg/dL (ref 0–200)
HDL: 51 mg/dL (ref >40)
LDL Cholesterol: 53 mg/dL (ref 0–99)
Total CHOL/HDL Ratio: 2.2 RATIO
Triglycerides: 29 mg/dL (ref <150)
VLDL: 6 mg/dL (ref 0–40)

## 2018-04-29 LAB — PROCALCITONIN: Procalcitonin: <0.1 ng/mL

## 2018-04-29 MED ORDER — FUROSEMIDE 40 MG PO TABS
40.0000 mg | ORAL_TABLET | Freq: Every day | ORAL | Status: DC
  Administered 2018-04-29 – 2018-04-30 (×2): 40 mg via ORAL
  Filled 2018-04-29 (×2): qty 1

## 2018-04-29 MED ORDER — TOLVAPTAN 15 MG PO TABS: 15.0000 mg | ORAL_TABLET | ORAL | Status: DC

## 2018-04-29 MED ORDER — TOLVAPTAN 15 MG PO TABS
15.0000 mg | ORAL_TABLET | ORAL | Status: DC
  Filled 2018-04-29: qty 1

## 2018-04-29 MED ORDER — TOLVAPTAN 15 MG PO TABS
15.0000 mg | ORAL_TABLET | Freq: Once | ORAL | Status: AC
  Administered 2018-04-29: 15 mg via ORAL
  Filled 2018-04-29: qty 1

## 2018-04-29 MED ORDER — FUROSEMIDE 40 MG PO TABS: 40.0000 mg | ORAL_TABLET | Freq: Two times a day (BID) | ORAL | Status: DC

## 2018-04-29 NOTE — Progress Notes (Addendum)
Advanced Heart Failure Rounding Note  PCP-Cardiologist: No primary care provider on file.   Subjective:    Lasix held yesterday with low BPs. Received am dose. Weight is up 4 lbs on standing scale. SBP 90-110s  Na back down to 124, creatinine stable, K 4.5. CBC pending. Remains afebrile on cefepime.   Cath and TEE done 8/26. With severe AI. Non-obstructive CAD. RHC with well compensated filling pressures.   Dr Roxy Manns saw him yesterday. He is scheduled for AVR on 05/01/18.  Denies CP, SOB, orthopnea or PND. Rhythm stable. Still has productive cough with clear sputum. No fever or chills.   R/LHC 04/27/18:  Prox LAD to Mid LAD lesion is 40% stenosed. Findings: Ao = 104/49 (74)  LV = 94/24 RA =  3 RV = 37/6 PA = 38/14 (25) PCW = 17 Fick cardiac output/index = 5.5/2.6 PVR = 1.5 WU Ao sat = 99% PA sat = 69%, 73% Assessment: 1. Mild nonobstructive CAD with 40% calcified mLAD lesion. 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 Normal LVEF 3. 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 Essentially normal hemodynamics Plan/Discussion: Proceed with AVR later this week.   TEE 04/27/18: - Left ventricle: Systolic function was normal. The estimated   ejection fraction was in the range of 55% to 60%. Wall motion was   normal; there were no regional wall motion abnormalities. - Aortic valve: Right coronary, left coronary, and noncoronary cusp   prolapse. No evidence of vegetation. There was severe   regurgitation directed centrally in the LVOT. - Mitral valve: No evidence of vegetation. - Left atrium: No evidence of thrombus in the atrial cavity or   appendage. - Right atrium: No evidence of thrombus in the atrial cavity or   appendage. - Atrial septum: No defect or patent foramen ovale was identified. - Tricuspid valve: No evidence of vegetation. - Pulmonic valve: No evidence of vegetation.  Objective:   Weight Range: 93.6 kg Body mass index is 27.22 kg/m.   Vital Signs:   Temp:   [98.3 F (36.8 C)-98.9 F (37.2 C)] 98.3 F (36.8 C) (08/28 0401) Pulse Rate:  [79-91] 87 (08/28 0401) Resp:  [12-18] 18 (08/28 0401) BP: (98-126)/(49-64) 98/52 (08/28 0401) SpO2:  [95 %-98 %] 96 % (08/28 0401) Weight:  [93.6 kg] 93.6 kg (08/28 0401) Last BM Date: 04/28/18  Weight change: Filed Weights   04/27/18 0603 04/28/18 0557 04/29/18 0401  Weight: 91.1 kg 91.8 kg 93.6 kg    Intake/Output:   Intake/Output Summary (Last 24 hours) at 04/29/2018 0731 Last data filed at 04/29/2018 0709 Gross per 24 hour  Intake 1406 ml  Output 1100 ml  Net 306 ml      Physical Exam    General: Lying in bed. No resp difficulty. HEENT: Normal anicteric  Neck: Supple. JVP 6-7. Carotids 2+ bilat; no bruits. No thyromegaly or nodule noted. Cor: PMI nondisplaced. RRR, 2/6 AI Lungs: expiratory wheezing on left side, clear on right Abdomen: Soft, non-tender, non-distended, no HSM. No bruits or masses. +BS  Extremities: no cyanosis, clubbing, rash, edema Neuro: alert & oriented x 3, cranial nerves grossly intact. moves all 4 extremities w/o difficulty. Affect pleasant   Telemetry   NSR 70-80s. Personally reviewed.   EKG    No new tracings.   Labs    CBC Recent Labs    04/27/18 0620 04/28/18 0433  WBC 12.5* 9.3  HGB 13.6 12.5*  HCT 37.7* 35.1*  MCV 97.9 98.3  PLT  229 756   Basic Metabolic Panel Recent Labs    04/27/18 0620 04/28/18 0433 04/29/18 0500  NA 124* 127* 124*  K 3.6 3.5 4.5  CL 84* 87* 89*  CO2 27 28 26   GLUCOSE 113* 106* 98  BUN 13 15 15   CREATININE 0.67 0.73 0.62  CALCIUM 8.9 8.7* 8.5*  MG 1.7 1.9  --    Liver Function Tests No results for input(s): AST, ALT, ALKPHOS, BILITOT, PROT, ALBUMIN in the last 72 hours. No results for input(s): LIPASE, AMYLASE in the last 72 hours. Cardiac Enzymes No results for input(s): CKTOTAL, CKMB, CKMBINDEX, TROPONINI in the last 72 hours.  BNP: BNP (last 3 results) Recent Labs    04/19/18 1233  BNP 1,549.5*     ProBNP (last 3 results) No results for input(s): PROBNP in the last 8760 hours.   D-Dimer No results for input(s): DDIMER in the last 72 hours. Hemoglobin A1C No results for input(s): HGBA1C in the last 72 hours. Fasting Lipid Panel Recent Labs    04/29/18 0500  CHOL 110  HDL 51  LDLCALC 53  TRIG 29  CHOLHDL 2.2   Thyroid Function Tests No results for input(s): TSH, T4TOTAL, T3FREE, THYROIDAB in the last 72 hours.  Invalid input(s): FREET3  Other results:   Imaging    No results found.   Medications:     Scheduled Medications: . buPROPion  150 mg Oral Daily  . carvedilol  3.125 mg Oral BID WC  . DULoxetine  60 mg Oral Daily  . enoxaparin (LOVENOX) injection  40 mg Subcutaneous Q24H  . feeding supplement (ENSURE ENLIVE)  237 mL Oral Q24H  . gatifloxacin  1 drop Right Eye QID  . losartan  50 mg Oral Daily  . multivitamin with minerals  1 tablet Oral Daily  . nicotine  14 mg Transdermal Daily  . polyethylene glycol  17 g Oral BID  . prednisoLONE acetate  1 drop Right Eye QID  . primidone  50 mg Oral QHS  . QUEtiapine  25 mg Oral QHS  . senna  1 tablet Oral Daily  . sodium chloride flush  3 mL Intravenous Q12H  . sodium chloride flush  3 mL Intravenous Q12H  . tamsulosin  0.4 mg Oral QPC supper  . tiZANidine  4 mg Oral QHS  . vitamin C  1,000 mg Oral Daily    Infusions: . sodium chloride    . sodium chloride    . ceFEPime (MAXIPIME) IV Stopped (04/29/18 0709)    PRN Medications: sodium chloride, sodium chloride, acetaminophen, clonazePAM, guaiFENesin-dextromethorphan, hydrocortisone cream, ondansetron (ZOFRAN) IV, phenol, sodium chloride, sodium chloride flush, sodium chloride flush    Patient Profile   [name] is a 72 year old with a history of HTN, tobacco abuse, aortic insufficiency, and diastolic heart failure.   CT consulted for AI. CT requested HF team to optimize prior to surgery.   Assessment/Plan  1. Dyspnea - Multifactorial  with AI/diastoic hf. - CTA with emphysema. Small pleural effusion and patchy density RUL RML. Afebrile. Remains on cefepime with upcoming valve surgery. Check CBC daily.  - Dyspnea resolved. Continue IS.   2. Aortic Insufficiency ECHO with AV severe regurgitation  - CT surgery consulted.  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 A/C Diastolic  HF EF 55%. Aortic Valve with severe regurgitation. - Volume status trending back up. - Restart lasix 40 mg daily.  4. Smoker  - Encouraged cessation. No change.   5. Hyperkalemia - K 4.5 today.    6. Hyponatremia  - Sodium 128> 129>126>124> 127>124. May need tolvaptan. - Limit free water.    7. Emphysema - Noted on CTA. No change.  8. Mild nonobstructive CAD - LHC 04/27/18: Prox LAD to Mid LAD lesion is 40% stenosed. - LDL 53 this am. Consider adding ASA + statin.   Medication concerns reviewed with patient and pharmacy team. Barriers identified: none   Length of Stay: [place], NP  04/29/2018, 7:31 AM  Advanced Heart Failure Team Pager [phone] (M-F; 7a - 4p)  Please contact [facility] for night-coverage after hours (4p -7a ) and weekends on [email or web address]  Patient seen and examined with the above-signed Advanced Practice Provider and/or Housestaff. I personally reviewed laboratory data, imaging studies and relevant notes. I independently examined the patient and formulated the important aspects of the plan. I have edited the note to reflect any of my changes or salient points. I have personally discussed the plan with the patient and/or family.  Overall stable. Volume status looks ok. Restart lasix today. Will give 1 dose tolvaptan for hyponatremia.  Scheduled for OR on Friday. Continue antibiotics for URI.   Glori Bickers, MD  8:37 AM

## 2018-04-29 NOTE — Progress Notes (Signed)
PROGRESS NOTE    Travis Reese  GYB:[id number] DOB: [birth year] DOA: 04/19/2018 PCP: Janine Limbo, PA-C    Brief Narrative:  72 year old male who presented with dyspnea.  He does have significant past medical history for coronary artery disease, hypertension and BPH.  Had a recent hospitalization was 08/11 to 08/13 at [facility], treated for decompensated heart failure.  He was discharged on furosemide.  At home he had recurrent symptoms of dyspnea that prompted to come to the hospital.  Physical examination blood pressure 158/66, heart rate 94, respirate 24, oxygen saturation 99%, moist mucous membranes, increased work of breathing, decreased breath sounds at bases, scattered rales, no rhonchi or wheezing, heart S1-S2 present rhythmic, diastolic murmur at left heart border, abdomen soft nontender, positive lower extremity edema.  He was admitted to the hospital with a working diagnosis of acute diastolic heart failure exacerbation   Assessment & Plan:   Principal Problem:   Severe aortic insufficiency Active Problems:   HTN (hypertension)   Acute CHF (congestive heart failure) (HCC)   BPH (benign prostatic hyperplasia)   Anxiety  1. Acute on chronic diastolic heart failure exacerbation. Patient is more euvoemic, will continue heart failure management with carvedilol and losartan. Diuresis with 40 mg of furosemide.   2. Severe aortic valve regurgitation. Continue diuresis, patient will have valve intervention (repair vs replacement) on 08/30. On prophylactic antibiotic therapy cefepime. Follow with cardiothoracic recommendations.   3. Depression. Anxiety. Continue bupropion, seroquel, clonazepam and duloxetine.   4. Tobacco abuse. Continue smoking cessation.   5. Hyponatremia. Patient back on furosemide, received one dose of tolvaptan.    DVT prophylaxis: enoxaparin   Code Status:  full Family Communication: I spoke with patient's family at the bedside and all questions  were addressed.  Disposition Plan/ discharge barriers: pending surgical intervention on the aortic valve.    Consultants:   Cardiology   CT surgery   Procedures:     Antimicrobials:   Cefepime (prophylactic)    Subjective: Patient with no chest pain, no nausea or vomiting.   Objective: Vitals:   04/28/18 1717 04/28/18 1931 04/29/18 0401 04/29/18 1005  BP: (!) 126/59 (!) 108/49 (!) 98/52 (!) 123/53  Pulse: 91 87 87 88  Resp:  18 18 16   Temp: 98.5 F (36.9 C) 98.5 F (36.9 C) 98.3 F (36.8 C) 98.3 F (36.8 C)  TempSrc: Oral Oral Oral Oral  SpO2: 97% 95% 96% 96%  Weight:   93.6 kg   Height:        Intake/Output Summary (Last 24 hours) at 04/29/2018 1154 Last data filed at 04/29/2018 1000 Gross per 24 hour  Intake 1289 ml  Output 1200 ml  Net 89 ml   Filed Weights   04/27/18 0603 04/28/18 0557 04/29/18 0401  Weight: 91.1 kg 91.8 kg 93.6 kg    Examination:   General: Not in pain or dyspnea.  Neurology: Awake and alert, non focal  E ENT: mild pallor, no icterus, oral mucosa moist Cardiovascular: No JVD. S1-S2 present, rhythmic, no gallops, or rubs, positive 2/6 diastolic murmur at the right upper sternal border. Trace lower extremity edema. Pulmonary: positive breath sounds bilaterally, adequate air movement, no wheezing, rhonchi or rales. Gastrointestinal. Abdomen with no organomegaly, non tender, no rebound or guarding Skin. No rashes Musculoskeletal: no joint deformities     Data Reviewed: I have personally reviewed following labs and imaging studies  CBC: Recent Labs  Lab 04/23/18 0507 04/25/18 0413 04/27/18 0620 04/28/18 0433 04/29/18 0830  WBC  7.5 9.0 12.5* 9.3 9.1  HGB 13.6 14.3 13.6 12.5* 12.1*  HCT 39.0 40.7 37.7* 35.1* 34.1*  MCV 101.3* 99.5 97.9 98.3 98.6  PLT 200 228 229 220 370   Basic Metabolic Panel: Recent Labs  Lab 04/24/18 0542 04/25/18 0413 04/26/18 0608 04/27/18 0620 04/28/18 0433 04/29/18 0500  NA 129* 126* 124*  124* 127* 124*  K 3.8 3.7 3.6 3.6 3.5 4.5  CL 87* 86* 84* 84* 87* 89*  CO2 30 30 29 27 28 26   GLUCOSE 138* 111* 106* 113* 106* 98  BUN 14 15 13 13 15 15   CREATININE 0.86 0.80 0.73 0.67 0.73 0.62  CALCIUM 9.2 9.0 8.9 8.9 8.7* 8.5*  MG 1.9  --   --  1.7 1.9  --    GFR: Estimated Creatinine Clearance: 98.5 mL/min (by C-G formula based on SCr of 0.62 mg/dL). Liver Function Tests: No results for input(s): AST, ALT, ALKPHOS, BILITOT, PROT, ALBUMIN in the last 168 hours. No results for input(s): LIPASE, AMYLASE in the last 168 hours. No results for input(s): AMMONIA in the last 168 hours. Coagulation Profile: No results for input(s): INR, PROTIME in the last 168 hours. Cardiac Enzymes: No results for input(s): CKTOTAL, CKMB, CKMBINDEX, TROPONINI in the last 168 hours. BNP (last 3 results) No results for input(s): PROBNP in the last 8760 hours. HbA1C: No results for input(s): HGBA1C in the last 72 hours. CBG: No results for input(s): GLUCAP in the last 168 hours. Lipid Profile: Recent Labs    04/29/18 0500  CHOL 110  HDL 51  LDLCALC 53  TRIG 29  CHOLHDL 2.2   Thyroid Function Tests: No results for input(s): TSH, T4TOTAL, FREET4, T3FREE, THYROIDAB in the last 72 hours. Anemia Panel: No results for input(s): VITAMINB12, FOLATE, FERRITIN, TIBC, IRON, RETICCTPCT in the last 72 hours.    Radiology Studies: I have reviewed all of the imaging during this hospital visit personally     Scheduled Meds: . buPROPion  150 mg Oral Daily  . carvedilol  3.125 mg Oral BID WC  . DULoxetine  60 mg Oral Daily  . enoxaparin (LOVENOX) injection  40 mg Subcutaneous Q24H  . feeding supplement (ENSURE ENLIVE)  237 mL Oral Q24H  . furosemide  40 mg Oral Daily  . gatifloxacin  1 drop Right Eye QID  . losartan  50 mg Oral Daily  . multivitamin with minerals  1 tablet Oral Daily  . nicotine  14 mg Transdermal Daily  . polyethylene glycol  17 g Oral BID  . prednisoLONE acetate  1 drop Right  Eye QID  . primidone  50 mg Oral QHS  . QUEtiapine  25 mg Oral QHS  . senna  1 tablet Oral Daily  . sodium chloride flush  3 mL Intravenous Q12H  . sodium chloride flush  3 mL Intravenous Q12H  . tamsulosin  0.4 mg Oral QPC supper  . tiZANidine  4 mg Oral QHS  . vitamin C  1,000 mg Oral Daily   Continuous Infusions: . sodium chloride    . sodium chloride    . ceFEPime (MAXIPIME) IV Stopped (04/29/18 0709)     LOS: 10 days        Lindel Marcell Gerome Apley, MD [facility] Pager [phone]

## 2018-04-29 NOTE — Progress Notes (Signed)
Pt asking if he may shower, paged Dr Cathlean Sauer to advise

## 2018-04-30 ENCOUNTER — Inpatient Hospital Stay (HOSPITAL_COMMUNITY): Payer: Medicare Other

## 2018-04-30 DIAGNOSIS — I351 Nonrheumatic aortic (valve) insufficiency: Secondary | ICD-10-CM

## 2018-04-30 LAB — CBC
HCT: 35.4 % — ABNORMAL LOW (ref 39.0–52.0)
Hemoglobin: 12.5 g/dL — ABNORMAL LOW (ref 13.0–17.0)
MCH: 35.4 pg — ABNORMAL HIGH (ref 26.0–34.0)
MCHC: 35.3 g/dL (ref 30.0–36.0)
MCV: 100.3 fL — ABNORMAL HIGH (ref 78.0–100.0)
Platelets: 226 10*3/uL (ref 150–400)
RBC: 3.53 MIL/uL — ABNORMAL LOW (ref 4.22–5.81)
RDW: 12.4 % (ref 11.5–15.5)
WBC: 7.5 10*3/uL (ref 4.0–10.5)

## 2018-04-30 LAB — COMPREHENSIVE METABOLIC PANEL
ALT: 15 U/L (ref 0–44)
AST: 22 U/L (ref 15–41)
Albumin: 2.7 g/dL — ABNORMAL LOW (ref 3.5–5.0)
Alkaline Phosphatase: 49 U/L (ref 38–126)
Anion gap: 10 (ref 5–15)
BUN: 12 mg/dL (ref 8–23)
CO2: 30 mmol/L (ref 22–32)
Calcium: 8.9 mg/dL (ref 8.9–10.3)
Chloride: 94 mmol/L — ABNORMAL LOW (ref 98–111)
Creatinine, Ser: 0.82 mg/dL (ref 0.61–1.24)
GFR calc Af Amer: >60 mL/min (ref >60)
GFR calc non Af Amer: >60 mL/min (ref >60)
Glucose, Bld: 96 mg/dL (ref 70–99)
Potassium: 4.9 mmol/L (ref 3.5–5.1)
Sodium: 134 mmol/L — ABNORMAL LOW (ref 135–145)
Total Bilirubin: 1 mg/dL (ref 0.3–1.2)
Total Protein: 5.3 g/dL — ABNORMAL LOW (ref 6.5–8.1)

## 2018-04-30 LAB — BLOOD GAS, ARTERIAL
Acid-Base Excess: 6.1 mmol/L — ABNORMAL HIGH (ref 0.0–2.0)
Bicarbonate: 29.9 mmol/L — ABNORMAL HIGH (ref 20.0–28.0)
Drawn by: 511911
FIO2: 21
O2 Saturation: 92.4 %
Patient temperature: 98.6
pCO2 arterial: 41.2 mmHg (ref 32.0–48.0)
pH, Arterial: 7.473 — ABNORMAL HIGH (ref 7.350–7.450)
pO2, Arterial: 64 mmHg — ABNORMAL LOW (ref 83.0–108.0)

## 2018-04-30 LAB — TYPE AND SCREEN
ABO/RH(D): A POS
Antibody Screen: NEGATIVE

## 2018-04-30 LAB — PROTIME-INR
INR: 1.04
Prothrombin Time: 13.5 seconds (ref 11.4–15.2)

## 2018-04-30 LAB — ABO/RH: ABO/RH(D): A POS

## 2018-04-30 LAB — APTT: aPTT: 34 seconds (ref 24–36)

## 2018-04-30 LAB — PREALBUMIN: Prealbumin: 12.7 mg/dL — ABNORMAL LOW (ref 18–38)

## 2018-04-30 MED ORDER — EPINEPHRINE PF 1 MG/ML IJ SOLN
0.0000 ug/min | INTRAVENOUS | Status: DC
  Filled 2018-04-30: qty 4

## 2018-04-30 MED ORDER — METOPROLOL TARTRATE 12.5 MG HALF TABLET
12.5000 mg | ORAL_TABLET | Freq: Once | ORAL | Status: AC
  Administered 2018-05-01: 12.5 mg via ORAL
  Filled 2018-04-30: qty 1

## 2018-04-30 MED ORDER — KENNESTONE BLOOD CARDIOPLEGIA (KBC) MANNITOL SYRINGE (20%, 32ML)
32.0000 mL | INTRAVENOUS | Status: DC
  Filled 2018-04-30: qty 1

## 2018-04-30 MED ORDER — KENNESTONE BLOOD CARDIOPLEGIA VIAL: 13.0000 mL | Freq: Once | Status: DC

## 2018-04-30 MED ORDER — TRANEXAMIC ACID (OHS) PUMP PRIME SOLUTION
2.0000 mg/kg | INTRAVENOUS | Status: DC
  Filled 2018-04-30: qty 1.86

## 2018-04-30 MED ORDER — KENNESTONE BLOOD CARDIOPLEGIA VIAL
13.0000 mL | Status: DC
  Filled 2018-04-30: qty 1

## 2018-04-30 MED ORDER — MAGNESIUM SULFATE 50 % IJ SOLN
40.0000 meq | INTRAMUSCULAR | Status: DC
  Filled 2018-04-30: qty 9.85

## 2018-04-30 MED ORDER — CHLORHEXIDINE GLUCONATE 4 % EX LIQD
60.0000 mL | Freq: Once | CUTANEOUS | Status: AC
  Administered 2018-04-30: 4 via TOPICAL
  Filled 2018-04-30: qty 60

## 2018-04-30 MED ORDER — SODIUM CHLORIDE 0.9 % IV SOLN
1.5000 g | INTRAVENOUS | Status: AC
  Administered 2018-05-01: 1.5 g via INTRAVENOUS
  Administered 2018-05-01: .75 g via INTRAVENOUS
  Filled 2018-04-30: qty 1.5

## 2018-04-30 MED ORDER — CHLORHEXIDINE GLUCONATE 0.12 % MT SOLN
15.0000 mL | Freq: Once | OROMUCOSAL | Status: AC
  Administered 2018-05-01: 15 mL via OROMUCOSAL
  Filled 2018-04-30: qty 15

## 2018-04-30 MED ORDER — DEXMEDETOMIDINE HCL IN NACL 400 MCG/100ML IV SOLN
0.1000 ug/kg/h | INTRAVENOUS | Status: DC
  Filled 2018-04-30: qty 100

## 2018-04-30 MED ORDER — TEMAZEPAM 15 MG PO CAPS: 15.0000 mg | ORAL_CAPSULE | Freq: Once | ORAL | Status: DC | PRN

## 2018-04-30 MED ORDER — POTASSIUM CHLORIDE 2 MEQ/ML IV SOLN
80.0000 meq | INTRAVENOUS | Status: DC
  Filled 2018-04-30: qty 40

## 2018-04-30 MED ORDER — DOPAMINE-DEXTROSE 3.2-5 MG/ML-% IV SOLN
0.0000 ug/kg/min | INTRAVENOUS | Status: DC
  Filled 2018-04-30: qty 250

## 2018-04-30 MED ORDER — NITROGLYCERIN IN D5W 200-5 MCG/ML-% IV SOLN
2.0000 ug/min | INTRAVENOUS | Status: DC
  Filled 2018-04-30: qty 250

## 2018-04-30 MED ORDER — CHLORHEXIDINE GLUCONATE 4 % EX LIQD
60.0000 mL | Freq: Once | CUTANEOUS | Status: AC
  Administered 2018-05-01: 4 via TOPICAL
  Filled 2018-04-30: qty 60

## 2018-04-30 MED ORDER — PLASMA-LYTE 148 IV SOLN
INTRAVENOUS | Status: DC
  Filled 2018-04-30: qty 2.5

## 2018-04-30 MED ORDER — VANCOMYCIN HCL 10 G IV SOLR
1500.0000 mg | INTRAVENOUS | Status: DC
  Filled 2018-04-30: qty 1500

## 2018-04-30 MED ORDER — VANCOMYCIN HCL 1000 MG IV SOLR
INTRAVENOUS | Status: DC
  Filled 2018-04-30: qty 1000

## 2018-04-30 MED ORDER — SODIUM CHLORIDE 0.9 % IV SOLN
30.0000 ug/min | INTRAVENOUS | Status: DC
  Filled 2018-04-30: qty 2

## 2018-04-30 MED ORDER — SODIUM CHLORIDE 0.9 % IV SOLN
INTRAVENOUS | Status: DC
  Filled 2018-04-30: qty 30

## 2018-04-30 MED ORDER — TRANEXAMIC ACID 1000 MG/10ML IV SOLN
1.5000 mg/kg/h | INTRAVENOUS | Status: DC
  Filled 2018-04-30: qty 25

## 2018-04-30 MED ORDER — TRANEXAMIC ACID (OHS) BOLUS VIA INFUSION
15.0000 mg/kg | INTRAVENOUS | Status: AC
  Administered 2018-05-01: 1393.5 mg via INTRAVENOUS
  Filled 2018-04-30: qty 1394

## 2018-04-30 MED ORDER — SODIUM CHLORIDE 0.9 % IV SOLN
INTRAVENOUS | Status: DC
  Filled 2018-04-30: qty 1

## 2018-04-30 MED ORDER — SODIUM CHLORIDE 0.9 % IV SOLN
750.0000 mg | INTRAVENOUS | Status: DC
  Filled 2018-04-30: qty 750

## 2018-04-30 MED ORDER — BISACODYL 5 MG PO TBEC: 5.0000 mg | DELAYED_RELEASE_TABLET | Freq: Once | ORAL | Status: DC

## 2018-04-30 NOTE — Progress Notes (Signed)
      [address]       [place] [address]             [phone]     CARDIOTHORACIC SURGERY PROGRESS NOTE  3 Days Post-Op  S/P Procedure(s) (LRB): RIGHT/LEFT HEART CATH AND CORONARY ANGIOGRAPHY (N/A)  Subjective: No complaints  Objective: Vital signs in last 24 hours: Temp:  [98.2 F (36.8 C)-99.2 F (37.3 C)] 98.7 F (37.1 C) (08/29 1142) Pulse Rate:  [87-96] 87 (08/29 1142) Cardiac Rhythm: Sinus tachycardia (08/29 0700) Resp:  [18-20] 18 (08/29 1142) BP: (106-124)/(56-63) 122/56 (08/29 1142) SpO2:  [94 %-97 %] 97 % (08/29 1142) Weight:  [92.9 kg] 92.9 kg (08/29 0508)  Physical Exam:  Rhythm:   sinus  Breath sounds: clear  Heart sounds:  RRR w/ holodiastolic murmur  Incisions:  n/a  Abdomen:  soft  Extremities:  warm   Intake/Output from previous day: 08/28 0701 - 08/29 0700 In: 923 [P.O.:720; I.V.:3; IV Piggyback:200] Out: 3505 [Urine:3505] Intake/Output this shift: Total I/O In: 360 [P.O.:360] Out: 725 [Urine:725]  Lab Results: Recent Labs    04/29/18 0830 04/30/18 0611  WBC 9.1 7.5  HGB 12.1* 12.5*  HCT 34.1* 35.4*  PLT 235 226   BMET:  Recent Labs    04/29/18 0500 04/30/18 0422  NA 124* 134*  K 4.5 4.9  CL 89* 94*  CO2 26 30  GLUCOSE 98 96  BUN 15 12  CREATININE 0.62 0.82  CALCIUM 8.5* 8.9    CBG (last 3)  No results for input(s): GLUCAP in the last 72 hours. PT/INR:   Recent Labs    04/30/18 0523  LABPROT 13.5  INR 1.04    CXR:  CHEST - 2 VIEW  COMPARISON:  Radiographs of April 25, 2018.  FINDINGS: Stable cardiomediastinal silhouette. Atherosclerosis of thoracic aorta is noted. No pneumothorax is noted. Right lung is clear. New left upper lobe opacity is noted concerning for possible pneumonia. Minimal left basilar subsegmental atelectasis or scarring is noted with minimal pleural effusion or pleural thickening.  IMPRESSION: New mild left upper lobe opacity is noted concerning for  possible pneumonia.  Minimal left basilar subsegmental atelectasis or scarring is noted with minimal left pleural effusion or pleural thickening.  Aortic Atherosclerosis (ICD10-I70.0).   Electronically Signed   By: Marijo Conception, M.D.   On: 04/30/2018 11:55  Assessment/Plan: S/P Procedure(s) (LRB): RIGHT/LEFT HEART CATH AND CORONARY ANGIOGRAPHY (N/A)  I have again reviewed the indications, risks and potential benefits of surgery with Mr. Warman this evening.  Expectations for his recovery have been discussed.  For OR tomorrow.  All questions answered.  I spent in excess of 15 minutes during the conduct of this hospital encounter and >50% of this time involved direct face-to-face encounter with the patient for counseling and/or coordination of their care.    Rexene Alberts, MD 04/30/2018 5:57 PM

## 2018-04-30 NOTE — Progress Notes (Signed)
CARDIAC REHAB PHASE I   Pre-op education completed with pt and daughter. Pt demonstrating >2000 on IS, improved from earlier this week. Educated pt on sternal precautions, in-the-tube sheet given. Pt educated on importance of mobility post-op. [facility] care guide and cardiac surgery booklet given. No further questions or concerns. Will continue to follow throughout his stay.  [id number] Rufina Falco, RN BSN 04/30/2018 12:12 PM

## 2018-04-30 NOTE — Progress Notes (Addendum)
Advanced Heart Failure Rounding Note  PCP-Cardiologist: No primary care provider on file.   Subjective:    Lasix restarted yesterday at 40 mg daily. Weight down 3 lbs. BMET pending.   Received dose of tolvaptan yesterday. BMET pending.  WBC normal. Tmax 99.2. Remains on cefepime.  Cath and TEE done 8/26. With severe AI. Non-obstructive CAD. RHC with well compensated filling pressures.   Scheduled for AVR with Dr Roxy Manns on 05/01/18.  Cough is improving. Ready to get surgery over with. Denies SOB or orthopnea. Walking in hallways with no problems. No dizziness. No CP.   R/LHC 04/27/18:  Prox LAD to Mid LAD lesion is 40% stenosed. Findings: Ao = 104/49 (74)  LV = 94/24 RA =  3 RV = 37/6 PA = 38/14 (25) PCW = 17 Fick cardiac output/index = 5.5/2.6 PVR = 1.5 WU Ao sat = 99% PA sat = 69%, 73% Assessment: 1. Mild nonobstructive CAD with 40% calcified mLAD lesion. 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 Normal LVEF 3. 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 Essentially normal hemodynamics Plan/Discussion: Proceed with AVR later this week.   TEE 04/27/18: - Left ventricle: Systolic function was normal. The estimated   ejection fraction was in the range of 55% to 60%. Wall motion was   normal; there were no regional wall motion abnormalities. - Aortic valve: Right coronary, left coronary, and noncoronary cusp   prolapse. No evidence of vegetation. There was severe   regurgitation directed centrally in the LVOT. - Mitral valve: No evidence of vegetation. - Left atrium: No evidence of thrombus in the atrial cavity or   appendage. - Right atrium: No evidence of thrombus in the atrial cavity or   appendage. - Atrial septum: No defect or patent foramen ovale was identified. - Tricuspid valve: No evidence of vegetation. - Pulmonic valve: No evidence of vegetation.  Objective:   Weight Range: 92.9 kg Body mass index is 27.01 kg/m.   Vital Signs:   Temp:  [98.2 F (36.8  C)-99.2 F (37.3 C)] 98.2 F (36.8 C) (08/29 0508) Pulse Rate:  [84-96] 96 (08/29 0508) Resp:  [16-20] 20 (08/29 0508) BP: (106-124)/(53-63) 106/59 (08/29 0508) SpO2:  [94 %-97 %] 94 % (08/29 0508) Weight:  [92.9 kg] 92.9 kg (08/29 0508) Last BM Date: 04/29/18  Weight change: Filed Weights   04/28/18 0557 04/29/18 0401 04/30/18 0508  Weight: 91.8 kg 93.6 kg 92.9 kg    Intake/Output:   Intake/Output Summary (Last 24 hours) at 04/30/2018 0717 Last data filed at 04/30/2018 0716 Gross per 24 hour  Intake 820 ml  Output 3605 ml  Net -2785 ml      Physical Exam    General: Lying in bed. No resp difficulty. HEENT: Normal Neck: Supple. JVP 6-7. Carotids 2+ bilat; no bruits. No thyromegaly or nodule noted. Cor: PMI nondisplaced. RRR, 2/6 AI Lungs: CTAB, normal effort. Abdomen: Soft, non-tender, non-distended, no HSM. No bruits or masses. +BS  Extremities: No cyanosis, clubbing, or rash. R and LLE no edema.  Neuro: Alert & orientedx3, cranial nerves grossly intact. moves all 4 extremities w/o difficulty. Affect pleasant  Telemetry   NSR 80-90s. Personally reviewed.   EKG    No new tracings.   Labs    CBC Recent Labs    04/29/18 0830 04/30/18 0611  WBC 9.1 7.5  HGB 12.1* 12.5*  HCT 34.1* 35.4*  MCV 98.6 100.3*  PLT 235 297   Basic Metabolic Panel Recent Labs  04/28/18 0433 04/29/18 0500  NA 127* 124*  K 3.5 4.5  CL 87* 89*  CO2 28 26  GLUCOSE 106* 98  BUN 15 15  CREATININE 0.73 0.62  CALCIUM 8.7* 8.5*  MG 1.9  --    Liver Function Tests No results for input(s): AST, ALT, ALKPHOS, BILITOT, PROT, ALBUMIN in the last 72 hours. No results for input(s): LIPASE, AMYLASE in the last 72 hours. Cardiac Enzymes No results for input(s): CKTOTAL, CKMB, CKMBINDEX, TROPONINI in the last 72 hours.  BNP: BNP (last 3 results) Recent Labs    04/19/18 1233  BNP 1,549.5*    ProBNP (last 3 results) No results for input(s): PROBNP in the last 8760  hours.   D-Dimer No results for input(s): DDIMER in the last 72 hours. Hemoglobin A1C No results for input(s): HGBA1C in the last 72 hours. Fasting Lipid Panel Recent Labs    04/29/18 0500  CHOL 110  HDL 51  LDLCALC 53  TRIG 29  CHOLHDL 2.2   Thyroid Function Tests No results for input(s): TSH, T4TOTAL, T3FREE, THYROIDAB in the last 72 hours.  Invalid input(s): FREET3  Other results:   Imaging    No results found.   Medications:     Scheduled Medications: . buPROPion  150 mg Oral Daily  . carvedilol  3.125 mg Oral BID WC  . DULoxetine  60 mg Oral Daily  . enoxaparin (LOVENOX) injection  40 mg Subcutaneous Q24H  . feeding supplement (ENSURE ENLIVE)  237 mL Oral Q24H  . furosemide  40 mg Oral Daily  . gatifloxacin  1 drop Right Eye QID  . losartan  50 mg Oral Daily  . multivitamin with minerals  1 tablet Oral Daily  . nicotine  14 mg Transdermal Daily  . polyethylene glycol  17 g Oral BID  . prednisoLONE acetate  1 drop Right Eye QID  . primidone  50 mg Oral QHS  . QUEtiapine  25 mg Oral QHS  . senna  1 tablet Oral Daily  . sodium chloride flush  3 mL Intravenous Q12H  . sodium chloride flush  3 mL Intravenous Q12H  . tamsulosin  0.4 mg Oral QPC supper  . tiZANidine  4 mg Oral QHS  . vitamin C  1,000 mg Oral Daily    Infusions: . sodium chloride    . sodium chloride    . ceFEPime (MAXIPIME) IV 2 g (04/30/18 0352)    PRN Medications: sodium chloride, sodium chloride, acetaminophen, clonazePAM, guaiFENesin-dextromethorphan, hydrocortisone cream, ondansetron (ZOFRAN) IV, phenol, sodium chloride, sodium chloride flush, sodium chloride flush    Patient Profile   Travis Reese is a 72 year old with a history of HTN, tobacco abuse, aortic insufficiency, and diastolic heart failure.   CT consulted for AI. CT requested HF team to optimize prior to surgery.   Assessment/Plan  1. Dyspnea - Multifactorial with AI/diastoic hf. - CTA with emphysema. Small  pleural effusion and patchy density RUL RML. Afebrile. Remains on cefepime with upcoming valve surgery. WBC normal. - Dyspnea resolved. Cough improving. Continue IS.   2. 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 A/C Diastolic HF EF 35%. Aortic Valve with severe regurgitation. - Volume status stable - Continue lasix 40 mg daily.  4. Smoker  - Encouraged cessation. No change.    5. Hyperkalemia - BMET pending.   6. Hyponatremia  - Sodium 128> 129>126>124> 127>124>pending this am.  - Received tolvaptan yesterday. - Limit free water.    7. Emphysema - Noted on CTA. No change.   8. Mild nonobstructive CAD - LHC 04/27/18: Prox LAD to Mid LAD lesion is 40% stenosed. - LDL 53 8/28. Consider adding ASA + statin.  - Continue coreg 3.125 mg BID  Medication concerns reviewed with patient and pharmacy team. Barriers identified: none   Length of Stay: [place], NP  04/30/2018, 7:17 AM  Advanced Heart Failure Team Pager [phone] (M-F; [place])  Please contact [facility] for night-coverage after hours (4p -7a ) and weekends on [email or web address]  Patient seen and examined with the above-signed Advanced Practice Provider and/or Housestaff. I personally reviewed laboratory data, imaging studies and relevant notes. I independently examined the patient and formulated the important aspects of the plan. I have edited the note to reflect any of my changes or salient points. I have personally discussed the plan with the patient and/or family.  Doing well. Volume status looks ok. Back on lasix. Got dose of tolvaptan yesterday. Await BMET to f/u on hyponatremia. AVR tomorrow.   Glori Bickers, MD  9:19 AM

## 2018-04-30 NOTE — Progress Notes (Signed)
PROGRESS NOTE    Travis Reese  CMK:[id number] DOB: [birth year] DOA: 04/19/2018 PCP: Janine Limbo, PA-C    Brief Narrative:  72 year old male who presented with dyspnea.  He does have significant past medical history for coronary artery disease, hypertension and BPH.  Had a recent hospitalization was 08/11 to 08/13 at [facility], treated for decompensated heart failure.  He was discharged on furosemide.  At home he had recurrent symptoms of dyspnea that prompted to come to the hospital.  Physical examination blood pressure 158/66, heart rate 94, respirate 24, oxygen saturation 99%, moist mucous membranes, increased work of breathing, decreased breath sounds at bases, scattered rales, no rhonchi or wheezing, heart S1-S2 present rhythmic, diastolic murmur at left heart border, abdomen soft nontender, positive lower extremity edema.  He was admitted to the hospital with a working diagnosis of acute diastolic heart failure exacerbation   Assessment & Plan:   Principal Problem:   Severe aortic insufficiency Active Problems:   HTN (hypertension)   Acute CHF (congestive heart failure) (HCC)   BPH (benign prostatic hyperplasia)   Anxiety   1. Acute on chronic diastolic heart failure exacerbation. Heart failure management with carvedilol and losartan. Furosemide to keep negative fluid balance.   2. Severe aortic valve regurgitation. Scheduled for valve intervention (repair vs replacement) on 08/30. Continue with prophylactic antibiotic therapy IV cefuroxime.    3. Depression. Anxiety. On bupropion, seroquel, clonazepam and duloxetine. No confusion or agitation.   4. Tobacco abuse. Smoking cessation.   5. Hyponatremia. Had one dose of tolvaptan yesterday, Na up to 134 today with serum cr at 0,82 and K at 4,9. Will add fluid restriction to prevent recurrent hyponatremia.    DVT prophylaxis: enoxaparin   Code Status:  full Family Communication: I spoke with patient's daughter  at the bedside and all questions were addressed.  Disposition Plan/ discharge barriers: pending surgical intervention on the aortic valve.    Consultants:   Cardiology   CT surgery   Procedures:     Antimicrobials:   Cefepime (prophylactic)    Subjective: Patient out of bed, no nausea or vomiting, no chest pain.   Objective: Vitals:   04/29/18 2137 04/30/18 0034 04/30/18 0508 04/30/18 1142  BP: (!) 124/58 120/63 (!) 106/59 (!) 122/56  Pulse: 93 96 96 87  Resp: 20 18 20 18   Temp: 99.2 F (37.3 C) 98.3 F (36.8 C) 98.2 F (36.8 C) 98.7 F (37.1 C)  TempSrc: Oral Oral Oral Oral  SpO2: 97% 94% 94% 97%  Weight:   92.9 kg   Height:        Intake/Output Summary (Last 24 hours) at 04/30/2018 1254 Last data filed at 04/30/2018 0900 Gross per 24 hour  Intake 700 ml  Output 3405 ml  Net -2705 ml   Filed Weights   04/28/18 0557 04/29/18 0401 04/30/18 0508  Weight: 91.8 kg 93.6 kg 92.9 kg    Examination:   General: Not in pain or dyspnea.  Neurology: Awake and alert, non focal  E ENT: no pallor, no icterus, oral mucosa moist Cardiovascular: No JVD. S1-S2 present, rhythmic, no gallops or rubs, positive diastolic 5-6/9 murmur at the right sternal border. Trace lower extremity edema. Pulmonary: vesicular breath sounds bilaterally, adequate air movement, no wheezing, rhonchi or rales. Gastrointestinal. Abdomen with no organomegaly, non tender, no rebound or guarding Skin. No rashes Musculoskeletal: no joint deformities     Data Reviewed: I have personally reviewed following labs and imaging studies  CBC: Recent Labs  Lab 04/25/18 0413 04/27/18 0620 04/28/18 0433 04/29/18 0830 04/30/18 0611  WBC 9.0 12.5* 9.3 9.1 7.5  HGB 14.3 13.6 12.5* 12.1* 12.5*  HCT 40.7 37.7* 35.1* 34.1* 35.4*  MCV 99.5 97.9 98.3 98.6 100.3*  PLT 228 229 220 235 818   Basic Metabolic Panel: Recent Labs  Lab 04/24/18 0542  04/26/18 0608 04/27/18 0620 04/28/18 0433  04/29/18 0500 04/30/18 0422  NA 129*   < > 124* 124* 127* 124* 134*  K 3.8   < > 3.6 3.6 3.5 4.5 4.9  CL 87*   < > 84* 84* 87* 89* 94*  CO2 30   < > 29 27 28 26 30   GLUCOSE 138*   < > 106* 113* 106* 98 96  BUN 14   < > 13 13 15 15 12   CREATININE 0.86   < > 0.73 0.67 0.73 0.62 0.82  CALCIUM 9.2   < > 8.9 8.9 8.7* 8.5* 8.9  MG 1.9  --   --  1.7 1.9  --   --    < > = values in this interval not displayed.   GFR: Estimated Creatinine Clearance: 96.1 mL/min (by C-G formula based on SCr of 0.82 mg/dL). Liver Function Tests: Recent Labs  Lab 04/30/18 0422  AST 22  ALT 15  ALKPHOS 49  BILITOT 1.0  PROT 5.3*  ALBUMIN 2.7*   No results for input(s): LIPASE, AMYLASE in the last 168 hours. No results for input(s): AMMONIA in the last 168 hours. Coagulation Profile: Recent Labs  Lab 04/30/18 0523  INR 1.04   Cardiac Enzymes: No results for input(s): CKTOTAL, CKMB, CKMBINDEX, TROPONINI in the last 168 hours. BNP (last 3 results) No results for input(s): PROBNP in the last 8760 hours. HbA1C: No results for input(s): HGBA1C in the last 72 hours. CBG: No results for input(s): GLUCAP in the last 168 hours. Lipid Profile: Recent Labs    04/29/18 0500  CHOL 110  HDL 51  LDLCALC 53  TRIG 29  CHOLHDL 2.2   Thyroid Function Tests: No results for input(s): TSH, T4TOTAL, FREET4, T3FREE, THYROIDAB in the last 72 hours. Anemia Panel: No results for input(s): VITAMINB12, FOLATE, FERRITIN, TIBC, IRON, RETICCTPCT in the last 72 hours.    Radiology Studies: I have reviewed all of the imaging during this hospital visit personally     Scheduled Meds: . buPROPion  150 mg Oral Daily  . carvedilol  3.125 mg Oral BID WC  . DULoxetine  60 mg Oral Daily  . feeding supplement (ENSURE ENLIVE)  237 mL Oral Q24H  . furosemide  40 mg Oral Daily  . gatifloxacin  1 drop Right Eye QID  . [START ON 05/01/2018] heparin-papaverine-plasmalyte irrigation   Irrigation To OR  . [START ON  05/01/2018] [place] Blood Cardioplegia (KBC) lidocaine 2% Syringe (41mL)  13 mL Intracoronary To OR  . [START ON 05/01/2018] [place] Blood Cardioplegia (KBC) lidocaine 2% Syringe (4mL)  13 mL Intracoronary To OR  . [START ON 05/01/2018] [place] Blood Cardioplegia (KBC) mannitol 20% Syringe (72mL)  32 mL Intracoronary To OR  . [START ON 05/01/2018] [place] Blood Cardioplegia (KBC) mannitol 20% Syringe (42mL)  32 mL Intracoronary To OR  . losartan  50 mg Oral Daily  . [START ON 05/01/2018] magnesium sulfate  40 mEq Other To OR  . multivitamin with minerals  1 tablet Oral Daily  . nicotine  14 mg Transdermal Daily  . polyethylene glycol  17 g Oral BID  . [START ON  05/01/2018] potassium chloride  80 mEq Other To OR  . prednisoLONE acetate  1 drop Right Eye QID  . primidone  50 mg Oral QHS  . QUEtiapine  25 mg Oral QHS  . senna  1 tablet Oral Daily  . sodium chloride flush  3 mL Intravenous Q12H  . sodium chloride flush  3 mL Intravenous Q12H  . tamsulosin  0.4 mg Oral QPC supper  . tiZANidine  4 mg Oral QHS  . [START ON 05/01/2018] tranexamic acid  15 mg/kg Intravenous To OR  . [START ON 05/01/2018] tranexamic acid  2 mg/kg Intracatheter To OR  . [START ON 05/01/2018] vancomycin 1000 mg in NS (1000 ml) irrigation for Dr. Roxy Manns case   Irrigation To OR  . vitamin C  1,000 mg Oral Daily   Continuous Infusions: . sodium chloride    . sodium chloride    . [START ON 05/01/2018] cefUROXime (ZINACEF)  IV    . [START ON 05/01/2018] cefUROXime (ZINACEF)  IV    . [START ON 05/01/2018] dexmedetomidine    . [START ON 05/01/2018] DOPamine    . [START ON 05/01/2018] epinephrine    . [START ON 05/01/2018] heparin 30,000 units/NS 1000 mL solution for CELLSAVER    . [START ON 05/01/2018] insulin (NOVOLIN-R) infusion    . [START ON 05/01/2018] nitroGLYCERIN    . [START ON 05/01/2018] phenylephrine 20mg /283mL NS (0.08mg /ml) infusion    . [START ON 05/01/2018] tranexamic acid (CYKLOKAPRON) infusion ([facility])    .  [START ON 05/01/2018] vancomycin       LOS: 11 days        Mauricio Gerome Apley, MD [facility] Pager [phone]

## 2018-05-01 ENCOUNTER — Inpatient Hospital Stay (HOSPITAL_COMMUNITY): Payer: Medicare Other | Admitting: Anesthesiology

## 2018-05-01 ENCOUNTER — Inpatient Hospital Stay (HOSPITAL_COMMUNITY): Payer: Medicare Other

## 2018-05-01 ENCOUNTER — Encounter (HOSPITAL_COMMUNITY)
Admission: EM | Disposition: A | Discharge status: Home / Self Care | Payer: Self-pay | Source: Home / Self Care | Attending: Thoracic Surgery (Cardiothoracic Vascular Surgery)

## 2018-05-01 DIAGNOSIS — Z952 Presence of prosthetic heart valve: Secondary | ICD-10-CM

## 2018-05-01 DIAGNOSIS — Z953 Presence of xenogenic heart valve: Secondary | ICD-10-CM

## 2018-05-01 HISTORY — PX: TEE WITHOUT CARDIOVERSION: SHX5443

## 2018-05-01 HISTORY — DX: Presence of xenogenic heart valve: Z95.3

## 2018-05-01 HISTORY — PX: BENTALL PROCEDURE: SHX5058

## 2018-05-01 LAB — POCT I-STAT 3, ART BLOOD GAS (G3+)
Acid-Base Excess: 4 mmol/L — ABNORMAL HIGH (ref 0.0–2.0)
Acid-base deficit: 3 mmol/L — ABNORMAL HIGH (ref 0.0–2.0)
Acid-base deficit: 2 mmol/L (ref 0.0–2.0)
Acid-base deficit: 2 mmol/L (ref 0.0–2.0)
Bicarbonate: 28.6 mmol/L — ABNORMAL HIGH (ref 20.0–28.0)
Bicarbonate: 23.4 mmol/L (ref 20.0–28.0)
Bicarbonate: 24.3 mmol/L (ref 20.0–28.0)
Bicarbonate: 23.8 mmol/L (ref 20.0–28.0)
O2 Saturation: 100 %
O2 Saturation: 98 %
O2 Saturation: 96 %
O2 Saturation: 97 %
Patient temperature: 36.1
Patient temperature: 36.3
Patient temperature: 36.7
TCO2: 30 mmol/L (ref 22–32)
TCO2: 25 mmol/L (ref 22–32)
TCO2: 26 mmol/L (ref 22–32)
TCO2: 25 mmol/L (ref 22–32)
pCO2 arterial: 45.1 mmHg (ref 32.0–48.0)
pCO2 arterial: 43.7 mmHg (ref 32.0–48.0)
pCO2 arterial: 48 mmHg (ref 32.0–48.0)
pCO2 arterial: 45.3 mmHg (ref 32.0–48.0)
pH, Arterial: 7.411 (ref 7.350–7.450)
pH, Arterial: 7.333 — ABNORMAL LOW (ref 7.350–7.450)
pH, Arterial: 7.309 — ABNORMAL LOW (ref 7.350–7.450)
pH, Arterial: 7.327 — ABNORMAL LOW (ref 7.350–7.450)
pO2, Arterial: 352 mmHg — ABNORMAL HIGH (ref 83.0–108.0)
pO2, Arterial: 118 mmHg — ABNORMAL HIGH (ref 83.0–108.0)
pO2, Arterial: 90 mmHg (ref 83.0–108.0)
pO2, Arterial: 96 mmHg (ref 83.0–108.0)

## 2018-05-01 LAB — POCT I-STAT, CHEM 8
BUN: 9 mg/dL (ref 8–23)
BUN: 7 mg/dL — ABNORMAL LOW (ref 8–23)
BUN: 8 mg/dL (ref 8–23)
BUN: 8 mg/dL (ref 8–23)
BUN: 7 mg/dL — ABNORMAL LOW (ref 8–23)
BUN: 7 mg/dL — ABNORMAL LOW (ref 8–23)
BUN: 7 mg/dL — ABNORMAL LOW (ref 8–23)
BUN: 8 mg/dL (ref 8–23)
Calcium, Ion: 1.23 mmol/L (ref 1.15–1.40)
Calcium, Ion: 1.09 mmol/L — ABNORMAL LOW (ref 1.15–1.40)
Calcium, Ion: 1.29 mmol/L (ref 1.15–1.40)
Calcium, Ion: 1.12 mmol/L — ABNORMAL LOW (ref 1.15–1.40)
Calcium, Ion: 1.1 mmol/L — ABNORMAL LOW (ref 1.15–1.40)
Calcium, Ion: 1.08 mmol/L — ABNORMAL LOW (ref 1.15–1.40)
Calcium, Ion: 1.05 mmol/L — ABNORMAL LOW (ref 1.15–1.40)
Calcium, Ion: 1.2 mmol/L (ref 1.15–1.40)
Chloride: 94 mmol/L — ABNORMAL LOW (ref 98–111)
Chloride: 93 mmol/L — ABNORMAL LOW (ref 98–111)
Chloride: 94 mmol/L — ABNORMAL LOW (ref 98–111)
Chloride: 94 mmol/L — ABNORMAL LOW (ref 98–111)
Chloride: 94 mmol/L — ABNORMAL LOW (ref 98–111)
Chloride: 95 mmol/L — ABNORMAL LOW (ref 98–111)
Chloride: 96 mmol/L — ABNORMAL LOW (ref 98–111)
Chloride: 100 mmol/L (ref 98–111)
Creatinine, Ser: 0.5 mg/dL — ABNORMAL LOW (ref 0.61–1.24)
Creatinine, Ser: 0.5 mg/dL — ABNORMAL LOW (ref 0.61–1.24)
Creatinine, Ser: 0.6 mg/dL — ABNORMAL LOW (ref 0.61–1.24)
Creatinine, Ser: 0.5 mg/dL — ABNORMAL LOW (ref 0.61–1.24)
Creatinine, Ser: 0.4 mg/dL — ABNORMAL LOW (ref 0.61–1.24)
Creatinine, Ser: 0.4 mg/dL — ABNORMAL LOW (ref 0.61–1.24)
Creatinine, Ser: 0.4 mg/dL — ABNORMAL LOW (ref 0.61–1.24)
Creatinine, Ser: 0.5 mg/dL — ABNORMAL LOW (ref 0.61–1.24)
Glucose, Bld: 105 mg/dL — ABNORMAL HIGH (ref 70–99)
Glucose, Bld: 101 mg/dL — ABNORMAL HIGH (ref 70–99)
Glucose, Bld: 104 mg/dL — ABNORMAL HIGH (ref 70–99)
Glucose, Bld: 133 mg/dL — ABNORMAL HIGH (ref 70–99)
Glucose, Bld: 137 mg/dL — ABNORMAL HIGH (ref 70–99)
Glucose, Bld: 143 mg/dL — ABNORMAL HIGH (ref 70–99)
Glucose, Bld: 112 mg/dL — ABNORMAL HIGH (ref 70–99)
Glucose, Bld: 144 mg/dL — ABNORMAL HIGH (ref 70–99)
HCT: 31 % — ABNORMAL LOW (ref 39.0–52.0)
HCT: 26 % — ABNORMAL LOW (ref 39.0–52.0)
HCT: 28 % — ABNORMAL LOW (ref 39.0–52.0)
HCT: 25 % — ABNORMAL LOW (ref 39.0–52.0)
HCT: 24 % — ABNORMAL LOW (ref 39.0–52.0)
HCT: 22 % — ABNORMAL LOW (ref 39.0–52.0)
HCT: 25 % — ABNORMAL LOW (ref 39.0–52.0)
HCT: 30 % — ABNORMAL LOW (ref 39.0–52.0)
Hemoglobin: 10.5 g/dL — ABNORMAL LOW (ref 13.0–17.0)
Hemoglobin: 8.8 g/dL — ABNORMAL LOW (ref 13.0–17.0)
Hemoglobin: 9.5 g/dL — ABNORMAL LOW (ref 13.0–17.0)
Hemoglobin: 8.5 g/dL — ABNORMAL LOW (ref 13.0–17.0)
Hemoglobin: 8.2 g/dL — ABNORMAL LOW (ref 13.0–17.0)
Hemoglobin: 7.5 g/dL — ABNORMAL LOW (ref 13.0–17.0)
Hemoglobin: 8.5 g/dL — ABNORMAL LOW (ref 13.0–17.0)
Hemoglobin: 10.2 g/dL — ABNORMAL LOW (ref 13.0–17.0)
Potassium: 4.5 mmol/L (ref 3.5–5.1)
Potassium: 4.1 mmol/L (ref 3.5–5.1)
Potassium: 4.7 mmol/L (ref 3.5–5.1)
Potassium: 4.4 mmol/L (ref 3.5–5.1)
Potassium: 4.2 mmol/L (ref 3.5–5.1)
Potassium: 4.2 mmol/L (ref 3.5–5.1)
Potassium: 4.2 mmol/L (ref 3.5–5.1)
Potassium: 4.7 mmol/L (ref 3.5–5.1)
Sodium: 131 mmol/L — ABNORMAL LOW (ref 135–145)
Sodium: 132 mmol/L — ABNORMAL LOW (ref 135–145)
Sodium: 132 mmol/L — ABNORMAL LOW (ref 135–145)
Sodium: 131 mmol/L — ABNORMAL LOW (ref 135–145)
Sodium: 132 mmol/L — ABNORMAL LOW (ref 135–145)
Sodium: 131 mmol/L — ABNORMAL LOW (ref 135–145)
Sodium: 132 mmol/L — ABNORMAL LOW (ref 135–145)
Sodium: 134 mmol/L — ABNORMAL LOW (ref 135–145)
TCO2: 28 mmol/L (ref 22–32)
TCO2: 27 mmol/L (ref 22–32)
TCO2: 28 mmol/L (ref 22–32)
TCO2: 28 mmol/L (ref 22–32)
TCO2: 26 mmol/L (ref 22–32)
TCO2: 26 mmol/L (ref 22–32)
TCO2: 25 mmol/L (ref 22–32)
TCO2: 23 mmol/L (ref 22–32)

## 2018-05-01 LAB — PROTIME-INR
INR: 1.58
Prothrombin Time: 18.7 seconds — ABNORMAL HIGH (ref 11.4–15.2)

## 2018-05-01 LAB — CBC
HCT: 33.8 % — ABNORMAL LOW (ref 39.0–52.0)
HCT: 33.1 % — ABNORMAL LOW (ref 39.0–52.0)
Hemoglobin: 11.4 g/dL — ABNORMAL LOW (ref 13.0–17.0)
Hemoglobin: 11.4 g/dL — ABNORMAL LOW (ref 13.0–17.0)
MCH: 34.8 pg — ABNORMAL HIGH (ref 26.0–34.0)
MCH: 35.6 pg — ABNORMAL HIGH (ref 26.0–34.0)
MCHC: 33.7 g/dL (ref 30.0–36.0)
MCHC: 34.4 g/dL (ref 30.0–36.0)
MCV: 103 fL — ABNORMAL HIGH (ref 78.0–100.0)
MCV: 103.4 fL — ABNORMAL HIGH (ref 78.0–100.0)
Platelets: 164 10*3/uL (ref 150–400)
Platelets: 148 10*3/uL — ABNORMAL LOW (ref 150–400)
RBC: 3.28 MIL/uL — ABNORMAL LOW (ref 4.22–5.81)
RBC: 3.2 MIL/uL — ABNORMAL LOW (ref 4.22–5.81)
RDW: 12.6 % (ref 11.5–15.5)
RDW: 12.6 % (ref 11.5–15.5)
WBC: 23.5 10*3/uL — ABNORMAL HIGH (ref 4.0–10.5)
WBC: 17.4 10*3/uL — ABNORMAL HIGH (ref 4.0–10.5)

## 2018-05-01 LAB — GLUCOSE, CAPILLARY
Glucose-Capillary: 121 mg/dL — ABNORMAL HIGH (ref 70–99)
Glucose-Capillary: 126 mg/dL — ABNORMAL HIGH (ref 70–99)
Glucose-Capillary: 125 mg/dL — ABNORMAL HIGH (ref 70–99)
Glucose-Capillary: 116 mg/dL — ABNORMAL HIGH (ref 70–99)
Glucose-Capillary: 92 mg/dL (ref 70–99)
Glucose-Capillary: 116 mg/dL — ABNORMAL HIGH (ref 70–99)
Glucose-Capillary: 149 mg/dL — ABNORMAL HIGH (ref 70–99)
Glucose-Capillary: 129 mg/dL — ABNORMAL HIGH (ref 70–99)
Glucose-Capillary: 129 mg/dL — ABNORMAL HIGH (ref 70–99)

## 2018-05-01 LAB — BASIC METABOLIC PANEL
Anion gap: 7 (ref 5–15)
BUN: 9 mg/dL (ref 8–23)
CO2: 28 mmol/L (ref 22–32)
Calcium: 9 mg/dL (ref 8.9–10.3)
Chloride: 95 mmol/L — ABNORMAL LOW (ref 98–111)
Creatinine, Ser: 0.65 mg/dL (ref 0.61–1.24)
GFR calc Af Amer: >60 mL/min (ref >60)
GFR calc non Af Amer: >60 mL/min (ref >60)
Glucose, Bld: 109 mg/dL — ABNORMAL HIGH (ref 70–99)
Potassium: 4.4 mmol/L (ref 3.5–5.1)
Sodium: 130 mmol/L — ABNORMAL LOW (ref 135–145)

## 2018-05-01 LAB — POCT I-STAT 4, (NA,K, GLUC, HGB,HCT)
Glucose, Bld: 123 mg/dL — ABNORMAL HIGH (ref 70–99)
HCT: 33 % — ABNORMAL LOW (ref 39.0–52.0)
Hemoglobin: 11.2 g/dL — ABNORMAL LOW (ref 13.0–17.0)
Potassium: 4.4 mmol/L (ref 3.5–5.1)
Sodium: 132 mmol/L — ABNORMAL LOW (ref 135–145)

## 2018-05-01 LAB — APTT: aPTT: 40 seconds — ABNORMAL HIGH (ref 24–36)

## 2018-05-01 LAB — CREATININE, SERUM
Creatinine, Ser: 0.66 mg/dL (ref 0.61–1.24)
GFR calc Af Amer: >60 mL/min (ref >60)
GFR calc non Af Amer: >60 mL/min (ref >60)

## 2018-05-01 LAB — MAGNESIUM: Magnesium: 3 mg/dL — ABNORMAL HIGH (ref 1.7–2.4)

## 2018-05-01 LAB — HEMOGLOBIN AND HEMATOCRIT, BLOOD
HCT: 21.2 % — ABNORMAL LOW (ref 39.0–52.0)
Hemoglobin: 7.3 g/dL — ABNORMAL LOW (ref 13.0–17.0)

## 2018-05-01 LAB — SURGICAL PCR SCREEN
MRSA, PCR: NEGATIVE
Staphylococcus aureus: NEGATIVE

## 2018-05-01 LAB — FIBRINOGEN: Fibrinogen: 317 mg/dL (ref 210–475)

## 2018-05-01 LAB — PLATELET COUNT: Platelets: 154 10*3/uL (ref 150–400)

## 2018-05-01 SURGERY — BENTALL PROCEDURE
Anesthesia: General | Site: Chest

## 2018-05-01 MED ORDER — ARTIFICIAL TEARS OPHTHALMIC OINT
TOPICAL_OINTMENT | OPHTHALMIC | Status: DC | PRN
  Administered 2018-05-01: 1 via OPHTHALMIC

## 2018-05-01 MED ORDER — PROTAMINE SULFATE 10 MG/ML IV SOLN
INTRAVENOUS | Status: DC | PRN
  Administered 2018-05-01: 220 mg via INTRAVENOUS
  Administered 2018-05-01: 30 mg via INTRAVENOUS

## 2018-05-01 MED ORDER — ACETAMINOPHEN 160 MG/5ML PO SOLN: 1000.0000 mg | Freq: Four times a day (QID) | ORAL | Status: DC

## 2018-05-01 MED ORDER — GLYCOPYRROLATE PF 0.2 MG/ML IJ SOSY
PREFILLED_SYRINGE | INTRAMUSCULAR | Status: DC | PRN
  Administered 2018-05-01: .2 mg via INTRAVENOUS

## 2018-05-01 MED ORDER — SODIUM CHLORIDE 0.9% FLUSH: 3.0000 mL | INTRAVENOUS | Status: DC | PRN

## 2018-05-01 MED ORDER — VANCOMYCIN HCL IN DEXTROSE 1-5 GM/200ML-% IV SOLN
1000.0000 mg | Freq: Once | INTRAVENOUS | Status: AC
  Administered 2018-05-01: 1000 mg via INTRAVENOUS
  Filled 2018-05-01: qty 200

## 2018-05-01 MED ORDER — MIDAZOLAM HCL 5 MG/5ML IJ SOLN
INTRAMUSCULAR | Status: DC | PRN
  Administered 2018-05-01: 3 mg via INTRAVENOUS
  Administered 2018-05-01: 2 mg via INTRAVENOUS
  Administered 2018-05-01: 1 mg via INTRAVENOUS
  Administered 2018-05-01 (×3): 2 mg via INTRAVENOUS

## 2018-05-01 MED ORDER — HEPARIN SODIUM (PORCINE) 1000 UNIT/ML IJ SOLN
INTRAMUSCULAR | Status: DC | PRN
  Administered 2018-05-01: 28000 [IU] via INTRAVENOUS

## 2018-05-01 MED ORDER — VANCOMYCIN HCL 1000 MG IV SOLR
INTRAVENOUS | Status: DC | PRN
  Administered 2018-05-01: 1000 mg via INTRAVENOUS

## 2018-05-01 MED ORDER — EPHEDRINE 5 MG/ML INJ
INTRAVENOUS | Status: AC
  Filled 2018-05-01: qty 10

## 2018-05-01 MED ORDER — ROCURONIUM BROMIDE 10 MG/ML (PF) SYRINGE
PREFILLED_SYRINGE | INTRAVENOUS | Status: DC | PRN
  Administered 2018-05-01 (×5): 50 mg via INTRAVENOUS

## 2018-05-01 MED ORDER — PHENYLEPHRINE HCL-NACL 20-0.9 MG/250ML-% IV SOLN
0.0000 ug/min | INTRAVENOUS | Status: DC
  Administered 2018-05-01: 50 ug/min via INTRAVENOUS
  Filled 2018-05-01: qty 250

## 2018-05-01 MED ORDER — MIDAZOLAM HCL 10 MG/2ML IJ SOLN
INTRAMUSCULAR | Status: AC
  Filled 2018-05-01: qty 2

## 2018-05-01 MED ORDER — TRAMADOL HCL 50 MG PO TABS: 50.0000 mg | ORAL_TABLET | ORAL | Status: DC | PRN

## 2018-05-01 MED ORDER — PROPOFOL 10 MG/ML IV BOLUS
INTRAVENOUS | Status: DC | PRN
  Administered 2018-05-01: 120 mg via INTRAVENOUS

## 2018-05-01 MED ORDER — SODIUM CHLORIDE 0.9 % IV SOLN
INTRAVENOUS | Status: DC
  Administered 2018-05-01: 1 [IU]/h via INTRAVENOUS
  Filled 2018-05-01: qty 1

## 2018-05-01 MED ORDER — CHLORHEXIDINE GLUCONATE 0.12 % MT SOLN
15.0000 mL | OROMUCOSAL | Status: AC
  Administered 2018-05-01: 15 mL via OROMUCOSAL

## 2018-05-01 MED ORDER — SODIUM CHLORIDE 0.9 % IR SOLN
Status: DC | PRN
  Administered 2018-05-01: 6000 mL

## 2018-05-01 MED ORDER — PANTOPRAZOLE SODIUM 40 MG PO TBEC
40.0000 mg | DELAYED_RELEASE_TABLET | Freq: Every day | ORAL | Status: DC
  Administered 2018-05-03 – 2018-05-08 (×6): 40 mg via ORAL
  Filled 2018-05-01 (×6): qty 1

## 2018-05-01 MED ORDER — ALBUMIN HUMAN 5 % IV SOLN
250.0000 mL | INTRAVENOUS | Status: DC | PRN
  Administered 2018-05-01 (×2): 250 mL via INTRAVENOUS

## 2018-05-01 MED ORDER — PHENYLEPHRINE 40 MCG/ML (10ML) SYRINGE FOR IV PUSH (FOR BLOOD PRESSURE SUPPORT)
PREFILLED_SYRINGE | INTRAVENOUS | Status: AC
  Filled 2018-05-01: qty 10

## 2018-05-01 MED ORDER — SODIUM CHLORIDE 0.45 % IV SOLN
INTRAVENOUS | Status: DC | PRN
  Administered 2018-05-01: 15:00:00 via INTRAVENOUS

## 2018-05-01 MED ORDER — SODIUM CHLORIDE 0.9 % IV SOLN
1.5000 g | Freq: Two times a day (BID) | INTRAVENOUS | Status: AC
  Administered 2018-05-01 – 2018-05-03 (×4): 1.5 g via INTRAVENOUS
  Filled 2018-05-01 (×4): qty 1.5

## 2018-05-01 MED ORDER — ROCURONIUM BROMIDE 50 MG/5ML IV SOSY
PREFILLED_SYRINGE | INTRAVENOUS | Status: AC
  Filled 2018-05-01: qty 25

## 2018-05-01 MED ORDER — MORPHINE SULFATE (PF) 2 MG/ML IV SOLN
1.0000 mg | INTRAVENOUS | Status: DC | PRN
  Administered 2018-05-02: 2 mg via INTRAVENOUS
  Filled 2018-05-01: qty 1

## 2018-05-01 MED ORDER — FAMOTIDINE IN NACL 20-0.9 MG/50ML-% IV SOLN
20.0000 mg | Freq: Two times a day (BID) | INTRAVENOUS | Status: DC
  Administered 2018-05-01: 20 mg via INTRAVENOUS

## 2018-05-01 MED ORDER — POTASSIUM CHLORIDE 10 MEQ/50ML IV SOLN: 10.0000 meq | INTRAVENOUS | Status: AC

## 2018-05-01 MED ORDER — MAGNESIUM SULFATE 4 GM/100ML IV SOLN
4.0000 g | Freq: Once | INTRAVENOUS | Status: AC
  Administered 2018-05-01: 4 g via INTRAVENOUS
  Filled 2018-05-01: qty 100

## 2018-05-01 MED ORDER — TRANEXAMIC ACID 1000 MG/10ML IV SOLN
INTRAVENOUS | Status: DC | PRN
  Administered 2018-05-01: 1.5 mg/kg/h via INTRAVENOUS

## 2018-05-01 MED ORDER — EPHEDRINE SULFATE-NACL 50-0.9 MG/10ML-% IV SOSY
PREFILLED_SYRINGE | INTRAVENOUS | Status: DC | PRN
  Administered 2018-05-01: 5 mg via INTRAVENOUS
  Administered 2018-05-01: 10 mg via INTRAVENOUS

## 2018-05-01 MED ORDER — ORAL CARE MOUTH RINSE
15.0000 mL | Freq: Two times a day (BID) | OROMUCOSAL | Status: DC
  Administered 2018-05-02 – 2018-05-07 (×8): 15 mL via OROMUCOSAL

## 2018-05-01 MED ORDER — MIDAZOLAM HCL 2 MG/2ML IJ SOLN
INTRAMUSCULAR | Status: AC
  Filled 2018-05-01: qty 2

## 2018-05-01 MED ORDER — SODIUM CHLORIDE 0.9 % IV SOLN
250.0000 mL | INTRAVENOUS | Status: DC
  Administered 2018-05-07 (×2): 250 mL via INTRAVENOUS

## 2018-05-01 MED ORDER — SODIUM CHLORIDE 0.9% FLUSH
3.0000 mL | Freq: Two times a day (BID) | INTRAVENOUS | Status: DC
  Administered 2018-05-02 – 2018-05-04 (×4): 3 mL via INTRAVENOUS
  Administered 2018-05-05: 10 mL via INTRAVENOUS
  Administered 2018-05-05 – 2018-05-06 (×3): 3 mL via INTRAVENOUS

## 2018-05-01 MED ORDER — PROPOFOL 10 MG/ML IV BOLUS
INTRAVENOUS | Status: AC
  Filled 2018-05-01: qty 20

## 2018-05-01 MED ORDER — MORPHINE SULFATE (PF) 2 MG/ML IV SOLN
1.0000 mg | INTRAVENOUS | Status: DC | PRN
  Administered 2018-05-01 (×2): 2 mg via INTRAVENOUS
  Administered 2018-05-01: 4 mg via INTRAVENOUS
  Filled 2018-05-01 (×2): qty 1
  Filled 2018-05-01: qty 2

## 2018-05-01 MED ORDER — ALBUMIN HUMAN 5 % IV SOLN
INTRAVENOUS | Status: DC | PRN
  Administered 2018-05-01: 14:00:00 via INTRAVENOUS

## 2018-05-01 MED ORDER — MIDAZOLAM HCL 2 MG/2ML IJ SOLN: 2.0000 mg | INTRAMUSCULAR | Status: DC | PRN

## 2018-05-01 MED ORDER — SODIUM CHLORIDE 0.9 % IJ SOLN
INTRAMUSCULAR | Status: AC
  Filled 2018-05-01: qty 30

## 2018-05-01 MED ORDER — SODIUM CHLORIDE 0.9 % IV SOLN
INTRAVENOUS | Status: DC | PRN
  Administered 2018-05-01: 1 [IU]/h via INTRAVENOUS

## 2018-05-01 MED ORDER — BISACODYL 5 MG PO TBEC
10.0000 mg | DELAYED_RELEASE_TABLET | Freq: Every day | ORAL | Status: DC
  Administered 2018-05-02 – 2018-05-06 (×3): 10 mg via ORAL
  Filled 2018-05-01 (×3): qty 2

## 2018-05-01 MED ORDER — ASPIRIN 81 MG PO CHEW: 324.0000 mg | CHEWABLE_TABLET | Freq: Every day | ORAL | Status: DC

## 2018-05-01 MED ORDER — NITROGLYCERIN IN D5W 200-5 MCG/ML-% IV SOLN
0.0000 ug/min | INTRAVENOUS | Status: DC
  Administered 2018-05-02: 5 ug/min via INTRAVENOUS
  Filled 2018-05-01: qty 250

## 2018-05-01 MED ORDER — BISACODYL 10 MG RE SUPP: 10.0000 mg | Freq: Every day | RECTAL | Status: DC

## 2018-05-01 MED ORDER — DOCUSATE SODIUM 100 MG PO CAPS
200.0000 mg | ORAL_CAPSULE | Freq: Every day | ORAL | Status: DC
  Administered 2018-05-02 – 2018-05-06 (×3): 200 mg via ORAL
  Filled 2018-05-01 (×3): qty 2

## 2018-05-01 MED ORDER — DEXMEDETOMIDINE HCL IN NACL 200 MCG/50ML IV SOLN
0.0000 ug/kg/h | INTRAVENOUS | Status: DC
  Administered 2018-05-01: 0.7 ug/kg/h via INTRAVENOUS
  Filled 2018-05-01: qty 50

## 2018-05-01 MED ORDER — DEXMEDETOMIDINE HCL IN NACL 400 MCG/100ML IV SOLN
INTRAVENOUS | Status: DC | PRN
  Administered 2018-05-01: 0.7 ug/kg/h via INTRAVENOUS

## 2018-05-01 MED ORDER — ORAL CARE MOUTH RINSE: 15.0000 mL | OROMUCOSAL | Status: DC

## 2018-05-01 MED ORDER — HEPARIN SODIUM (PORCINE) 1000 UNIT/ML IJ SOLN
INTRAMUSCULAR | Status: AC
  Filled 2018-05-01: qty 1

## 2018-05-01 MED ORDER — OXYCODONE HCL 5 MG PO TABS
5.0000 mg | ORAL_TABLET | ORAL | Status: DC | PRN
  Administered 2018-05-02 (×2): 10 mg via ORAL
  Administered 2018-05-03 – 2018-05-07 (×3): 5 mg via ORAL
  Filled 2018-05-01 (×4): qty 1
  Filled 2018-05-01 (×2): qty 2

## 2018-05-01 MED ORDER — CALCIUM CHLORIDE 10 % IV SOLN
INTRAVENOUS | Status: AC
  Filled 2018-05-01: qty 20

## 2018-05-01 MED ORDER — PROTAMINE SULFATE 10 MG/ML IV SOLN
INTRAVENOUS | Status: AC
  Filled 2018-05-01: qty 5

## 2018-05-01 MED ORDER — FENTANYL CITRATE (PF) 250 MCG/5ML IJ SOLN
INTRAMUSCULAR | Status: AC
  Filled 2018-05-01: qty 25

## 2018-05-01 MED ORDER — LACTATED RINGERS IV SOLN
INTRAVENOUS | Status: DC | PRN
  Administered 2018-05-01 (×2): via INTRAVENOUS

## 2018-05-01 MED ORDER — SODIUM CHLORIDE 0.9 % IV SOLN
INTRAVENOUS | Status: DC
  Administered 2018-05-01: 15:00:00 via INTRAVENOUS

## 2018-05-01 MED ORDER — ACETAMINOPHEN 160 MG/5ML PO SOLN: 650.0000 mg | Freq: Once | ORAL | Status: AC

## 2018-05-01 MED ORDER — LACTATED RINGERS IV SOLN: INTRAVENOUS | Status: DC

## 2018-05-01 MED ORDER — CHLORHEXIDINE GLUCONATE CLOTH 2 % EX PADS
6.0000 | MEDICATED_PAD | Freq: Every day | CUTANEOUS | Status: DC
  Administered 2018-05-02 – 2018-05-04 (×3): 6 via TOPICAL

## 2018-05-01 MED ORDER — PHENYLEPHRINE 40 MCG/ML (10ML) SYRINGE FOR IV PUSH (FOR BLOOD PRESSURE SUPPORT)
PREFILLED_SYRINGE | INTRAVENOUS | Status: DC | PRN
  Administered 2018-05-01: 80 ug via INTRAVENOUS
  Administered 2018-05-01: 120 ug via INTRAVENOUS
  Administered 2018-05-01: 80 ug via INTRAVENOUS
  Administered 2018-05-01: 40 ug via INTRAVENOUS
  Administered 2018-05-01: 80 ug via INTRAVENOUS
  Administered 2018-05-01 (×3): 120 ug via INTRAVENOUS
  Administered 2018-05-01: 80 ug via INTRAVENOUS

## 2018-05-01 MED ORDER — ACETAMINOPHEN 500 MG PO TABS
1000.0000 mg | ORAL_TABLET | Freq: Four times a day (QID) | ORAL | Status: AC
  Administered 2018-05-01 – 2018-05-06 (×20): 1000 mg via ORAL
  Filled 2018-05-01 (×20): qty 2

## 2018-05-01 MED ORDER — ARTIFICIAL TEARS OPHTHALMIC OINT
TOPICAL_OINTMENT | OPHTHALMIC | Status: AC
  Filled 2018-05-01: qty 3.5

## 2018-05-01 MED ORDER — SODIUM CHLORIDE 0.9 % IV SOLN
INTRAVENOUS | Status: DC | PRN
  Administered 2018-05-01: 25 ug/min via INTRAVENOUS

## 2018-05-01 MED ORDER — ACETAMINOPHEN 650 MG RE SUPP
650.0000 mg | Freq: Once | RECTAL | Status: AC
  Administered 2018-05-01: 650 mg via RECTAL

## 2018-05-01 MED ORDER — VANCOMYCIN HCL 1000 MG IV SOLR
INTRAVENOUS | Status: DC | PRN
  Administered 2018-05-01: 1000 mL

## 2018-05-01 MED ORDER — PLASMA-LYTE 148 IV SOLN
INTRAVENOUS | Status: DC | PRN
  Administered 2018-05-01: 500 mL via INTRAVASCULAR

## 2018-05-01 MED ORDER — ONDANSETRON HCL 4 MG/2ML IJ SOLN: 4.0000 mg | Freq: Four times a day (QID) | INTRAMUSCULAR | Status: DC | PRN

## 2018-05-01 MED ORDER — HEMOSTATIC AGENTS (NO CHARGE) OPTIME
TOPICAL | Status: DC | PRN
  Administered 2018-05-01 (×5): 1 via TOPICAL

## 2018-05-01 MED ORDER — CHLORHEXIDINE GLUCONATE 0.12% ORAL RINSE (MEDLINE KIT)
15.0000 mL | Freq: Two times a day (BID) | OROMUCOSAL | Status: DC
  Administered 2018-05-01: 15 mL via OROMUCOSAL

## 2018-05-01 MED ORDER — PHENYLEPHRINE 40 MCG/ML (10ML) SYRINGE FOR IV PUSH (FOR BLOOD PRESSURE SUPPORT)
PREFILLED_SYRINGE | INTRAVENOUS | Status: AC
  Filled 2018-05-01: qty 20

## 2018-05-01 MED ORDER — DOPAMINE-DEXTROSE 3.2-5 MG/ML-% IV SOLN
0.0000 ug/kg/min | INTRAVENOUS | Status: DC
  Administered 2018-05-01: 3 ug/kg/min via INTRAVENOUS
  Filled 2018-05-01: qty 250

## 2018-05-01 MED ORDER — FENTANYL CITRATE (PF) 250 MCG/5ML IJ SOLN
INTRAMUSCULAR | Status: DC | PRN
  Administered 2018-05-01: 50 ug via INTRAVENOUS
  Administered 2018-05-01: 150 ug via INTRAVENOUS
  Administered 2018-05-01 (×2): 50 ug via INTRAVENOUS
  Administered 2018-05-01: 100 ug via INTRAVENOUS
  Administered 2018-05-01: 150 ug via INTRAVENOUS
  Administered 2018-05-01: 450 ug via INTRAVENOUS
  Administered 2018-05-01: 50 ug via INTRAVENOUS
  Administered 2018-05-01: 100 ug via INTRAVENOUS

## 2018-05-01 MED ORDER — METOPROLOL TARTRATE 5 MG/5ML IV SOLN
2.5000 mg | INTRAVENOUS | Status: DC | PRN
  Administered 2018-05-03: 5 mg via INTRAVENOUS
  Filled 2018-05-01: qty 5

## 2018-05-01 MED ORDER — ASPIRIN EC 325 MG PO TBEC
325.0000 mg | DELAYED_RELEASE_TABLET | Freq: Every day | ORAL | Status: DC
  Administered 2018-05-02 – 2018-05-08 (×7): 325 mg via ORAL
  Filled 2018-05-01 (×7): qty 1

## 2018-05-01 MED ORDER — LACTATED RINGERS IV SOLN: 500.0000 mL | Freq: Once | INTRAVENOUS | Status: DC | PRN

## 2018-05-01 MED ORDER — LACTATED RINGERS IV SOLN
INTRAVENOUS | Status: DC | PRN
  Administered 2018-05-01: 07:00:00 via INTRAVENOUS

## 2018-05-01 MED ORDER — INSULIN REGULAR BOLUS VIA INFUSION
0.0000 [IU] | Freq: Three times a day (TID) | INTRAVENOUS | Status: DC
  Filled 2018-05-01: qty 10

## 2018-05-01 MED ORDER — PROTAMINE SULFATE 10 MG/ML IV SOLN
INTRAVENOUS | Status: AC
  Filled 2018-05-01: qty 25

## 2018-05-01 SURGICAL SUPPLY — 116 items
ADAPTER CARDIO PERF ANTE/RETRO (ADAPTER) ×3 IMPLANT
APPLICATOR TIP COSEAL (VASCULAR PRODUCTS) ×3 IMPLANT
APPLICATOR TIP STD SYR BGAT-SY (MISCELLANEOUS) IMPLANT
ATTRACTOMAT 16X20 MAGNETIC DRP (DRAPES) ×3 IMPLANT
BAG DECANTER FOR FLEXI CONT (MISCELLANEOUS) ×3 IMPLANT
BLADE STERNUM SYSTEM 6 (BLADE) ×3 IMPLANT
BLADE SURG 11 STRL SS (BLADE) ×3 IMPLANT
CANISTER SUCT 3000ML PPV (MISCELLANEOUS) ×3 IMPLANT
CANNULA EZ GLIDE AORTIC 21FR (CANNULA) ×6 IMPLANT
CANNULA GUNDRY RCSP 15FR (MISCELLANEOUS) ×3 IMPLANT
CANNULA SOFTFLOW AORTIC 7M21FR (CANNULA) ×3 IMPLANT
CANNULA SUMP PERICARDIAL (CANNULA) ×3 IMPLANT
CATH CPB KIT OWEN (MISCELLANEOUS) ×3 IMPLANT
CATH HEART VENT LEFT (CATHETERS) ×2 IMPLANT
CATH ROBINSON RED A/P 18FR (CATHETERS) ×9 IMPLANT
CATH THORACIC 36FR (CATHETERS) IMPLANT
CATH THORACIC 36FR RT ANG (CATHETERS) ×3 IMPLANT
CAUTERY EYE LOW TEMP 1300F FIN (OPHTHALMIC RELATED) ×3 IMPLANT
CONN 1/2X1/2X1/2  BEN (MISCELLANEOUS) ×1
CONN 1/2X1/2X1/2 BEN (MISCELLANEOUS) ×2 IMPLANT
COVER PROBE W GEL 5X96 (DRAPES) ×6 IMPLANT
COVER SURGICAL LIGHT HANDLE (MISCELLANEOUS) ×6 IMPLANT
CRADLE DONUT ADULT HEAD (MISCELLANEOUS) ×3 IMPLANT
DERMABOND ADVANCED (GAUZE/BANDAGES/DRESSINGS) ×1
DERMABOND ADVANCED .7 DNX12 (GAUZE/BANDAGES/DRESSINGS) ×2 IMPLANT
DRAIN CHANNEL 32F RND 10.7 FF (WOUND CARE) ×3 IMPLANT
DRAPE BILATERAL SPLIT (DRAPES) IMPLANT
DRAPE CARDIOVASCULAR INCISE (DRAPES)
DRAPE CV SPLIT W-CLR ANES SCRN (DRAPES) IMPLANT
DRAPE INCISE IOBAN 66X45 STRL (DRAPES) ×6 IMPLANT
DRAPE SLUSH/WARMER DISC (DRAPES) ×3 IMPLANT
DRAPE SRG 135X102X78XABS (DRAPES) IMPLANT
DRSG AQUACEL AG ADV 3.5X14 (GAUZE/BANDAGES/DRESSINGS) ×3 IMPLANT
DRSG COVADERM 4X14 (GAUZE/BANDAGES/DRESSINGS) ×3 IMPLANT
ELECT BLADE 6.5 EXT (BLADE) ×3 IMPLANT
ELECT REM PT RETURN 9FT ADLT (ELECTROSURGICAL) ×6
ELECTRODE REM PT RTRN 9FT ADLT (ELECTROSURGICAL) ×4 IMPLANT
FELT TEFLON 1X6 (MISCELLANEOUS) ×9 IMPLANT
FEMORAL VENOUS CANN RAP (CANNULA) ×3 IMPLANT
GAUZE SPONGE 4X4 12PLY STRL (GAUZE/BANDAGES/DRESSINGS) ×3 IMPLANT
GLOVE BIO SURGEON STRL SZ 6 (GLOVE) IMPLANT
GLOVE BIO SURGEON STRL SZ 6.5 (GLOVE) ×6 IMPLANT
GLOVE BIO SURGEON STRL SZ7 (GLOVE) IMPLANT
GLOVE BIO SURGEON STRL SZ7.5 (GLOVE) IMPLANT
GLOVE BIO SURGEON STRL SZ8.5 (GLOVE) ×3 IMPLANT
GLOVE ORTHO TXT STRL SZ7.5 (GLOVE) ×9 IMPLANT
GLOVE SURG SS PI 6.0 STRL IVOR (GLOVE) ×3 IMPLANT
GOWN STRL REUS W/ TWL LRG LVL3 (GOWN DISPOSABLE) ×8 IMPLANT
GOWN STRL REUS W/TWL LRG LVL3 (GOWN DISPOSABLE) ×4
GRAFT GELWEAVE VALSALVA 30CM (Prosthesis & Implant Heart) ×3 IMPLANT
GRAFT GELWEAVE VALSALVA 32CM (Prosthesis & Implant Heart) ×3 IMPLANT
HEMOSTAT POWDER SURGIFOAM 1G (HEMOSTASIS) ×9 IMPLANT
INSERT FOGARTY XLG (MISCELLANEOUS) ×3 IMPLANT
KIT BASIN OR (CUSTOM PROCEDURE TRAY) ×3 IMPLANT
KIT DILATOR VASC 18G NDL (KITS) ×3 IMPLANT
KIT DRAINAGE VACCUM ASSIST (KITS) ×3 IMPLANT
KIT SUCTION CATH 14FR (SUCTIONS) ×9 IMPLANT
KIT TURNOVER KIT B (KITS) ×3 IMPLANT
LEAD PACING MYOCARDI (MISCELLANEOUS) ×3 IMPLANT
LINE VENT (MISCELLANEOUS) ×3 IMPLANT
NDL SUT 1 .5 CRC FRENCH EYE (NEEDLE) ×2 IMPLANT
NEEDLE FRENCH EYE (NEEDLE) ×1
NS IRRIG 1000ML POUR BTL (IV SOLUTION) ×15 IMPLANT
PACK E OPEN HEART (SUTURE) ×3 IMPLANT
PACK OPEN HEART (CUSTOM PROCEDURE TRAY) ×3 IMPLANT
PAD ARMBOARD 7.5X6 YLW CONV (MISCELLANEOUS) ×6 IMPLANT
POWDER SURGICEL 3.0 GRAM (HEMOSTASIS) ×6 IMPLANT
SEALANT SURG COSEAL 8ML (VASCULAR PRODUCTS) ×3 IMPLANT
SENSOR MYOCARDIAL TEMP (MISCELLANEOUS) ×3 IMPLANT
SET CARDIOPLEGIA MPS 5001102 (MISCELLANEOUS) ×3 IMPLANT
SET IRRIG TUBING LAPAROSCOPIC (IRRIGATION / IRRIGATOR) ×3 IMPLANT
SPONGE LAP 18X18 RF (DISPOSABLE) ×3 IMPLANT
SPONGE LAP 4X18 RFD (DISPOSABLE) ×3 IMPLANT
SUT BONE WAX W31G (SUTURE) ×3 IMPLANT
SUT ETHIBON 2 0 V 52N 30 (SUTURE) ×15 IMPLANT
SUT ETHIBON EXCEL 2-0 V-5 (SUTURE) IMPLANT
SUT ETHIBOND 2 0 SH (SUTURE)
SUT ETHIBOND 2 0 SH 36X2 (SUTURE) IMPLANT
SUT ETHIBOND 2 0 V4 (SUTURE) IMPLANT
SUT ETHIBOND 2 0V4 GREEN (SUTURE) IMPLANT
SUT ETHIBOND 4 0 RB 1 (SUTURE) IMPLANT
SUT ETHIBOND V-5 VALVE (SUTURE) IMPLANT
SUT ETHIBOND X763 2 0 SH 1 (SUTURE) ×12 IMPLANT
SUT MNCRL AB 3-0 PS2 18 (SUTURE) ×6 IMPLANT
SUT PDS AB 1 CTX 36 (SUTURE) ×6 IMPLANT
SUT PROLENE 3 0 SH DA (SUTURE) ×3 IMPLANT
SUT PROLENE 3 0 SH1 36 (SUTURE) ×36 IMPLANT
SUT PROLENE 4 0 RB 1 (SUTURE) ×16
SUT PROLENE 4 0 SH DA (SUTURE) ×6 IMPLANT
SUT PROLENE 4-0 RB1 .5 CRCL 36 (SUTURE) ×32 IMPLANT
SUT PROLENE 5 0 C 1 36 (SUTURE) ×9 IMPLANT
SUT PROLENE 6 0 C 1 30 (SUTURE) IMPLANT
SUT SILK  1 MH (SUTURE) ×2
SUT SILK 1 MH (SUTURE) ×4 IMPLANT
SUT SILK 2 0 SH CR/8 (SUTURE) IMPLANT
SUT SILK 3 0 SH CR/8 (SUTURE) IMPLANT
SUT STEEL 6MS V (SUTURE) IMPLANT
SUT VIC AB 2-0 CTX 27 (SUTURE) ×3 IMPLANT
SUT VIC AB 2-0 UR6 27 (SUTURE) ×3 IMPLANT
SUT VIC AB 3-0 SH 8-18 (SUTURE) ×3 IMPLANT
SYR 10ML KIT SKIN ADHESIVE (MISCELLANEOUS) IMPLANT
SYSTEM SAHARA CHEST DRAIN ATS (WOUND CARE) ×3 IMPLANT
TAPE CLOTH SURG 4X10 WHT LF (GAUZE/BANDAGES/DRESSINGS) ×3 IMPLANT
TAPE PAPER 2X10 WHT MICROPORE (GAUZE/BANDAGES/DRESSINGS) ×3 IMPLANT
TOWEL GREEN STERILE (TOWEL DISPOSABLE) ×3 IMPLANT
TOWEL GREEN STERILE FF (TOWEL DISPOSABLE) ×3 IMPLANT
TRAY FOLEY SLVR 14FR TEMP STAT (SET/KITS/TRAYS/PACK) ×3 IMPLANT
TRAY FOLEY SLVR 16FR TEMP STAT (SET/KITS/TRAYS/PACK) ×3 IMPLANT
TUBE CONNECTING 12X1/4 (SUCTIONS) ×3 IMPLANT
TUBE SUCT INTRACARD DLP 20F (MISCELLANEOUS) ×3 IMPLANT
UNDERPAD 30X30 (UNDERPADS AND DIAPERS) ×3 IMPLANT
VALVE AORTIC SZ27 INSP/RESIL (Valve) ×3 IMPLANT
VALVE AORTIC SZ29 INSP/RESIL (Valve) ×3 IMPLANT
VENT LEFT HEART 12002 (CATHETERS) ×3
WATER STERILE IRR 1000ML POUR (IV SOLUTION) ×6 IMPLANT
YANKAUER SUCT BULB TIP NO VENT (SUCTIONS) ×3 IMPLANT

## 2018-05-01 NOTE — Anesthesia Preprocedure Evaluation (Signed)
Anesthesia Evaluation  Patient identified by MRN, date of birth, ID band Patient awake    Reviewed: Allergy & Precautions, H&P , NPO status , Patient's Chart, lab work & pertinent test results  Airway Mallampati: II   Neck ROM: full    Dental   Pulmonary Current Smoker,    breath sounds clear to auscultation       Cardiovascular hypertension, +CHF  + Valvular Problems/Murmurs AI  Rhythm:regular Rate:Normal     Neuro/Psych PSYCHIATRIC DISORDERS Anxiety    GI/Hepatic   Endo/Other  obese  Renal/GU      Musculoskeletal  (+) Arthritis ,   Abdominal   Peds  Hematology   Anesthesia Other Findings   Reproductive/Obstetrics                             Anesthesia Physical Anesthesia Plan  ASA: III  Anesthesia Plan: General   Post-op Pain Management:    Induction: Intravenous  PONV Risk Score and Plan: 1 and Ondansetron, Dexamethasone, Midazolam and Treatment may vary due to age or medical condition  Airway Management Planned: Oral ETT  Additional Equipment: Arterial line, CVP, PA Cath, TEE, 3D TEE and Ultrasound Guidance Line Placement  Intra-op Plan:   Post-operative Plan: Post-operative intubation/ventilation  Informed Consent: I have reviewed the patients History and Physical, chart, labs and discussed the procedure including the risks, benefits and alternatives for the proposed anesthesia with the patient or authorized representative who has indicated his/her understanding and acceptance.     Plan Discussed with: CRNA, Anesthesiologist and Surgeon  Anesthesia Plan Comments:         Anesthesia Quick Evaluation

## 2018-05-01 NOTE — Progress Notes (Signed)
  Echocardiogram Echocardiogram Transesophageal has been performed.  Travis Reese 05/01/2018, 9:53 AM

## 2018-05-01 NOTE — Progress Notes (Addendum)
      [address]       [place] [address]             [phone]     CARDIOTHORACIC SURGERY PROGRESS NOTE  Subjective: Travis Reese has been scheduled for Procedure(s): AORTIC VALVE REPAIR OR REPLACEMENT (N/A) possible BENTALL PROCEDURE AORTIC ROOT REPLACEMENT (N/A) TRANSESOPHAGEAL ECHOCARDIOGRAM (TEE) (N/A) today.   Objective: Vital signs in last 24 hours: Temp:  [98.3 F (36.8 C)-98.7 F (37.1 C)] 98.3 F (36.8 C) (08/30 0536) Pulse Rate:  [87-95] 88 (08/30 0536) Cardiac Rhythm: Normal sinus rhythm (08/29 1900) Resp:  [18] 18 (08/30 0536) BP: (112-150)/(51-65) 112/51 (08/30 0536) SpO2:  [94 %-100 %] 94 % (08/30 0536) Weight:  [93.1 kg] 93.1 kg (08/30 0500)  Physical Exam: Unchanged from previously   Intake/Output from previous day: 08/29 0701 - 08/30 0700 In: 360 [P.O.:360] Out: 1725 [Urine:1725] Intake/Output this shift: No intake/output data recorded.  Lab Results: Recent Labs    04/29/18 0830 04/30/18 0611  WBC 9.1 7.5  HGB 12.1* 12.5*  HCT 34.1* 35.4*  PLT 235 226   BMET:  Recent Labs    04/30/18 0422 05/01/18 0435  NA 134* 130*  K 4.9 4.4  CL 94* 95*  CO2 30 28  GLUCOSE 96 109*  BUN 12 9  CREATININE 0.82 0.65  CALCIUM 8.9 9.0    CBG (last 3)  No results for input(s): GLUCAP in the last 72 hours. PT/INR:   Recent Labs    04/30/18 0523  LABPROT 13.5  INR 1.04    Assessment/Plan:   The various methods of treatment have been discussed with the patient. After consideration of the risks, benefits and treatment options the patient has consented to the planned procedure.   The patient has been seen and labs reviewed. There are no changes in the patient's condition to prevent proceeding with the planned procedure today.   Rexene Alberts, MD 05/01/2018 7:15AM

## 2018-05-01 NOTE — Brief Op Note (Signed)
04/19/2018 - 05/01/2018  2:46 PM  PATIENT:  Travis Reese  72 y.o. male  PRE-OPERATIVE DIAGNOSIS:  SEVERE AI  POST-OPERATIVE DIAGNOSIS:  SEVERE AI  PROCEDURE:  Procedure(s): BIOLOGICAL BENTALL PROCEDURE AORTIC ROOT REPLACEMENT WITH 30MM VALSALVA GRAFT & 27MM INSPIRIS VALVE. (N/A) TRANSESOPHAGEAL ECHOCARDIOGRAM (TEE) (N/A)  SURGEON:  Surgeon(s) and Role:    * Rexene Alberts, MD - Primary  PHYSICIAN ASSISTANT:  Nicholes Rough, PA-C   ANESTHESIA:   general  EBL:  765 mL   BLOOD ADMINISTERED:none  DRAINS: ROUTINE   LOCAL MEDICATIONS USED:  NONE  SPECIMEN:  Source of Specimen:  AORTIC VALVE LEAFLETS, PORTION OF THE AORTA  DISPOSITION OF SPECIMEN:  PATHOLOGY  COUNTS:  YES  DICTATION: .Dragon Dictation  PLAN OF CARE: Admit to inpatient   PATIENT DISPOSITION:  ICU - intubated and hemodynamically stable.   Delay start of Pharmacological VTE agent (>24hrs) due to surgical blood loss or risk of bleeding: yes

## 2018-05-01 NOTE — Procedures (Signed)
Extubation Procedure Note  Patient Details:   Name: TEAGON KRON DOB: 1946-01-11 MRN: [id number]   Airway Documentation:  Airway 8 mm (Active)  Secured at (cm) 21 cm 05/01/2018  8:10 PM  Measured From Lips 05/01/2018  8:10 PM  Secured Location Right 05/01/2018  8:10 PM  Secured By [employer] Tape 05/01/2018  8:10 PM  Cuff Pressure (cm H2O) 30 cm H2O 05/01/2018  8:10 PM  Site Condition Dry 05/01/2018  8:10 PM   Vent end date: 05/01/18 Vent end time: 2120   Evaluation  O2 sats: stable throughout Complications: No apparent complications Patient did tolerate procedure well. Bilateral Breath Sounds: Clear   Yes  Weaning mechanics done prior to extubation -28 NIF and 82ml on VC with great pt effort; positive cuff leak Pt extubated to 4L NCAN and able to speak  1500 on IS with RN RT to cont to monitor   Myrtis Ser 05/01/2018, 9:28 PM

## 2018-05-01 NOTE — Op Note (Signed)
CARDIOTHORACIC SURGERY OPERATIVE NOTE  Date of Procedure:  05/01/2018  Preoperative Diagnosis:   Severe Aortic Insufficiency  Ascending Thoracic Aortic Aneurysm  Postoperative Diagnosis: Same   Procedure:    Biological Bentall Aortic Root Replacement  Edwards Inspiris Resilia Stented Bovine Pericardial Tissue Valve (size 27 mm, ref # [id number], serial # [id number])  Gelweave Valsalva woven vascular graft (size 30 mm, ref # [id number] ADP, serial # [id number])  Reimplantation of left main and right coronary arteries    Surgeon: Valentina Gu. 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  The patient denies any significant previous cardiac history.  He specifically denies ever having been told that he had a heart murmur in the past.  He denies any known history of rheumatic fever.  He reports a 6 to 74-month history of progressive symptoms which initially were described as worsening fatigue with lower extremity weakness.  He began to experience progressive exertional shortness of breath.  Symptoms became more acutely worse several weeks ago.  Ultimately the patient was hospitalized at [facility] from August 11  through April 14, 2018.  The patient states that at time of his hospital admission at [facility] he had resting shortness of breath, orthopnea, and edema.  He was treated with diuretic therapy and improved.  Echocardiogram performed at that time revealed severe aortic insufficiency.  He was referred for cardiothoracic surgical consultation.  He was not referred to a cardiologist and he was never seen by a cardiologist during his hospitalization.  He states that he was discharged home on oral Lasix, and at the time of hospital discharge the patient states that he was doing fairly well..  Shortly after hospital discharge his symptoms of shortness of breath began to get worse.  He presented acutely to [facility] on April 19, 2018 with another acute exacerbation of congestive heart failure including resting shortness of breath, orthopnea, lower extremity edema, and pulmonary edema on admission chest x-ray.  Baseline BNP level was >1,500 with Troponin 0.02.  EKG revealed sinus rhythm without ischemic changes.  The patient was seen in consultation by Dr. Meda Coffee and has been treated with diuretic therapy under the care of Dr. Reymundo Poll and the hospitalist team.  His weight has progressively increased, up >5 lbs since admission.  Transthoracic echocardiogram was performed today and notable for the presence of severe aortic insufficiency.  Cardiothoracic surgical consultation was requested.  The patient subsequently underwent TEE and diagnostic cardiac catheterization.  TEE confirmed the presence of severe aortic insufficiency with a moderately dilated aortic root.  Catheterization was notable for the absence of significant coronary artery disease.  The patient has been seen in consultation and counseled at length regarding the indications, risks and potential benefits of surgery.  All questions  have been answered, and the patient provides full informed consent for the operation as  described.    DETAILS OF THE OPERATIVE PROCEDURE  Preparation:  The patient is brought to the operating room on the above mentioned date and central monitoring was established by the anesthesia team including placement of Swan-Ganz catheter and radial arterial line. The patient is placed in the supine position on the operating table.  Intravenous antibiotics are administered. General endotracheal anesthesia is induced uneventfully. A Foley catheter is placed.  Baseline transesophageal echocardiogram was performed.  Findings were notable for essentially wide open aortic insufficiency.  The aortic annulus was mildly dilated and the aortic root was dilated to 4.0 cm.  There was essentially no coaptation of the aortic valve leaflets.  Left ventricular size and function appeared essentially normal.  There was trace central mitral regurgitation.  No other abnormalities were noted.  The patient's chest, abdomen, both groins, and both lower extremities are prepared and draped in a sterile manner. A time out procedure is performed.   Surgical Approach:  A median sternotomy incision was performed and the pericardium is opened. The ascending aorta is dilated proximally but otherwise normal in appearance.  The aorta was essentially normal caliber at the level of the innominate artery.   Extracorporeal Cardiopulmonary Bypass and Myocardial Protection:  The right common femoral vein is cannulated with Seldinger technique and a flexible guidewire advanced under TEE guidance into the right atrium.  The patient is heparinized systemically.  The right common femoral vein is cannulated using a 23 x 25 French femoral venous cannula.  The ascending aorta was cannulated for cardiopulmonary bypass.  Adequate heparinization is verified.   The operative field was continuously flooded with carbon dioxide gas.  The entire pre-bypass portion of the operation was notable for stable hemodynamics.  Cardiopulmonary bypass  was begun and the surface of the heart is inspected.  A retrograde cardioplegia cannula is placed through the right atrium into the coronary sinus.  A left ventricular vent is placed through the right superior pulmonary vein.  A temperature probe was placed in the interventricular septum.  The patient is cooled to 28C systemic temperature.  The aortic cross clamp is applied and cardioplegia is delivered in a retrograde fashion through the coronary sinus catheter using modified [place] Nido cold blood cardioplegia (Kennestone blood cardioplegia protocol).   The initial cardioplegic arrest is rapid with early diastolic arrest.  Repeat doses of cardioplegia are administered at 90 minutes and every 30 minutes thereafter through the coronary sinus catheter in order to maintain completely flat electrocardiogram.  Myocardial protection was felt to be excellent.   Biological Bentall Aortic Root Replacement:  The ascending aorta is dissected away from the pulmonary artery.  Dissection is continued down into the aortic root.  A low transverse aortotomy incision is performed approximately 5 mm above the origin of the right coronary artery.  The aortic root is notably moderately aneurysmally dilated.  On initial inspection the valve appeared trileaflet.  However, it became clear that the patient actually had a type I bicuspid aortic valve with partial fusion of the left and right leaflets.  The single raphae between the left and right leaflets was completely detached from the wall of the aortic root.  It appeared that the patient had a large fenestration which had ruptured, thereby causing severe prolapse of the conjoined left right leaflet.  There was no leaflet perforation.  The aortic annulus was dilated.  The left right leaflet was essentially fractured  and completely incompetent.  An attempt at repair is felt to be potentially unwise under the circumstances with low confidence for long-term durability.  In conjunction  with the aneurysmal aortic root, Bentall aortic root replacement is felt to be the best long-term option.  The aortotomy incision is completed thereby transecting the aorta.  The leaflets of the aortic valve are excised sharply.  The left and right coronary arteries are each mobilized on separate buttons of sinus of Valsalva tissue.  The aortic root is sized and initially appears satisfactory for placement of a 29 mm stented bioprosthetic tissue valve.  The proximal suture line is performed using interrupted 2-0 Ethibond horizontal mattress pledgeted sutures with pledgets in the supra annular position.  A 29 mm Edwards Inspris Resilia stented bovine pericardial tissue valve is placed inside the proximal end of a 32 mm Gelweave Valsalva woven velour vascular graft.  The valve aortic root graft combination is lowered into place but appears to be too large.  Subsequently this valve and root graft is removed and a 27 mm Edwards Inspris Resilia stented bovine pericardial tissue valve (ref # 11500A, serial # [id number]) is placed inside the proximal end of a 30 mm Gelweave Valsalva woven velour graft (ref # [id number], serial # [id number]).  After the proximal suture line is completed the left main and right coronary arteries are each reimplanted into the Valsalva portion of the aortic root graft onto corresponding circular holes made in the graft using thermal cautery.  The proximal ascending aorta is excised to a level just below the aortic cross-clamp.  The distal end of the graft is trimmed and beveled to an appropriate length.  The distal suture line is completed using running 3-0 Prolene suture with Teflon felt strip to buttress the suture line with the aortic cross-clamp in place.   Procedure Completion:  One final dose of warm retrograde "reanimation dose" cardioplegia was administered retrograde through the coronary sinus catheter while all air was evacuated through the aortic root graft.  The aortic cross  clamp was removed after a total cross clamp time of 156 minutes.  Epicardial pacing wires are fixed to the right ventricular outflow tract and to the right atrial appendage. The patient is rewarmed to 37C temperature. The aortic and left ventricular vents are removed.  The patient is weaned and disconnected from cardiopulmonary bypass.  The patient's rhythm at separation from bypass was AV paced.  The patient was weaned from cardiopulmonary bypass without any inotropic support. Total cardiopulmonary bypass time for the operation was 194 minutes.  Followup transesophageal echocardiogram performed after separation from bypass revealed a well-seated aortic valve prosthesis that was functioning normally and without any sign of aortic insufficiency.  Left ventricular function was unchanged from preoperatively.  The aortic cannula was removed uneventfully. Protamine was administered to reverse the anticoagulation.  Femoral venous cannula was removed and manual pressure held on the groin for 30 minutes.  The mediastinum was inspected for hemostasis and irrigated with saline solution. The mediastinum was drained using 2 chest tubes placed through separate stab incisions inferiorly.  The soft tissues anterior to the aorta were reapproximated loosely. The sternum is closed with double strength sternal wire. The soft tissues anterior to the sternum were closed in multiple layers and the skin is closed with a running subcuticular skin closure.  The post-bypass portion of the operation was notable for stable rhythm and hemodynamics.  No blood products were administered during the operation.   Disposition:  The patient tolerated  the procedure well and is transported to the surgical intensive care in stable condition. There are no intraoperative complications. All sponge instrument and needle counts are verified correct at completion of the operation.    Valentina Gu. Roxy Manns MD 05/01/2018 2:47 PM

## 2018-05-01 NOTE — Transfer of Care (Signed)
Immediate Anesthesia Transfer of Care Note  Patient: Travis Reese  Procedure(s) Performed: BIOLOGICAL BENTALL PROCEDURE AORTIC ROOT REPLACEMENT WITH 30MM VALSALVA GRAFT & 27MM INSPIRIS VALVE. (N/A Chest) TRANSESOPHAGEAL ECHOCARDIOGRAM (TEE) (N/A )  Patient Location: ICU  Anesthesia Type:General  Level of Consciousness: Patient remains intubated per anesthesia plan  Airway & Oxygen Therapy: Patient remains intubated per anesthesia plan and Patient placed on Ventilator (see vital sign flow sheet for setting)  Post-op Assessment: Report given to RN and Post -op Vital signs reviewed and stable  Post vital signs: Reviewed and stable  Last Vitals:  Vitals Value Taken Time  BP    Temp 36.3 C 05/01/2018  3:19 PM  Pulse 80 05/01/2018  3:19 PM  Resp 12 05/01/2018  3:19 PM  SpO2 97 % 05/01/2018  3:19 PM  Vitals shown include unvalidated device data.  Last Pain:  Vitals:   05/01/18 0536  TempSrc: Oral  PainSc:       Patients Stated Pain Goal: 0 ([id number])  Complications: No apparent anesthesia complications

## 2018-05-01 NOTE — Progress Notes (Signed)
Patient interviewed in the preop area. Patient able to confirm name, DOB, procedure, no allergies, no metal in body and no pain. CRNA at bedside.   Leatha Gilding, RN

## 2018-05-01 NOTE — Progress Notes (Signed)
Patient extubated post-op without complication. Vitals remain stable with nursing ability to titrate down on Neo gtt. Patients pain controlled with IV Morphine / PO Tylenol. Patient alert and oriented in no acute distress. Patient dangled on side of bed with assistance. He is doing well with cough/deep breathing and using incentive spirometer reaching goal of 2000. Will continue to monitor.

## 2018-05-01 NOTE — Anesthesia Procedure Notes (Signed)
Central Venous Catheter Insertion Performed by: Albertha Ghee, MD, anesthesiologist Start/End8/30/2019 7:01 AM, 05/01/2018 7:12 AM Patient location: Pre-op. Preanesthetic checklist: patient identified, IV checked, site marked, risks and benefits discussed, surgical consent, monitors and equipment checked, pre-op evaluation, timeout performed and anesthesia consent Position: Trendelenburg Lidocaine 1% used for infiltration and patient sedated Hand hygiene performed , maximum sterile barriers used  and Seldinger technique used Catheter size: 9 Fr Central line was placed.MAC introducer Swan type:thermodilation Procedure performed using ultrasound guided technique. Ultrasound Notes:anatomy identified, needle tip was noted to be adjacent to the nerve/plexus identified, no ultrasound evidence of intravascular and/or intraneural injection and image(s) printed for medical record Attempts: 1 Following insertion, line sutured, dressing applied and Biopatch. Post procedure assessment: blood return through all ports, free fluid flow and no air  Patient tolerated the procedure well with no immediate complications.

## 2018-05-01 NOTE — Progress Notes (Signed)
T[facility] BRIEF SICU PROGRESS NOTE  Day of Surgery  S/P Procedure(s) (LRB): BIOLOGICAL BENTALL PROCEDURE AORTIC ROOT REPLACEMENT WITH 30MM VALSALVA GRAFT & 27MM INSPIRIS VALVE. (N/A) TRANSESOPHAGEAL ECHOCARDIOGRAM (TEE) (N/A)   Waking up on vent AAI paced w/ stable hemodynamics O2 sats 100% Chest tube output low UOP excellent Labs okay  Plan: Continue routine early postop  Rexene Alberts, MD 05/01/2018 7:32 PM

## 2018-05-01 NOTE — Anesthesia Procedure Notes (Signed)
Procedure Name: Intubation Date/Time: 05/01/2018 8:08 AM Performed by: Bryson Corona, CRNA Pre-anesthesia Checklist: Patient identified, Emergency Drugs available, Suction available and Patient being monitored Patient Re-evaluated:Patient Re-evaluated prior to induction Oxygen Delivery Method: Circle System Utilized Preoxygenation: Pre-oxygenation with 100% oxygen Induction Type: IV induction Ventilation: Oral airway inserted - appropriate to patient size Laryngoscope Size: Mac and 4 Grade View: Grade II Tube type: Oral Tube size: 8.0 mm Number of attempts: 1 Airway Equipment and Method: Stylet and Oral airway Placement Confirmation: ETT inserted through vocal cords under direct vision,  positive ETCO2 and breath sounds checked- equal and bilateral Secured at: 21 cm Tube secured with: Tape Dental Injury: Teeth and Oropharynx as per pre-operative assessment

## 2018-05-01 NOTE — Anesthesia Procedure Notes (Signed)
Arterial Line Insertion Start/End8/30/2019 6:45 AM, 05/01/2018 6:55 AM Performed by: Albertha Ghee, MD, Bryson Corona, CRNA, CRNA  Patient location: Pre-op. Preanesthetic checklist: patient identified, IV checked, site marked, risks and benefits discussed, surgical consent, monitors and equipment checked, pre-op evaluation, timeout performed and anesthesia consent Lidocaine 1% used for infiltration radial was placed Catheter size: 20 Fr Hand hygiene performed  and maximum sterile barriers used   Attempts: 1 Procedure performed without using ultrasound guided technique. Following insertion, dressing applied. Post procedure assessment: normal and unchanged  Patient tolerated the procedure well with no immediate complications.

## 2018-05-02 ENCOUNTER — Inpatient Hospital Stay (HOSPITAL_COMMUNITY): Payer: Medicare Other

## 2018-05-02 LAB — CBC
HCT: 31.7 % — ABNORMAL LOW (ref 39.0–52.0)
HCT: 33 % — ABNORMAL LOW (ref 39.0–52.0)
Hemoglobin: 10.8 g/dL — ABNORMAL LOW (ref 13.0–17.0)
Hemoglobin: 11 g/dL — ABNORMAL LOW (ref 13.0–17.0)
MCH: 35.2 pg — ABNORMAL HIGH (ref 26.0–34.0)
MCH: 34.7 pg — ABNORMAL HIGH (ref 26.0–34.0)
MCHC: 34.1 g/dL (ref 30.0–36.0)
MCHC: 33.3 g/dL (ref 30.0–36.0)
MCV: 103.3 fL — ABNORMAL HIGH (ref 78.0–100.0)
MCV: 104.1 fL — ABNORMAL HIGH (ref 78.0–100.0)
Platelets: 146 10*3/uL — ABNORMAL LOW (ref 150–400)
Platelets: 178 10*3/uL (ref 150–400)
RBC: 3.07 MIL/uL — ABNORMAL LOW (ref 4.22–5.81)
RBC: 3.17 MIL/uL — ABNORMAL LOW (ref 4.22–5.81)
RDW: 12.8 % (ref 11.5–15.5)
RDW: 12.8 % (ref 11.5–15.5)
WBC: 14.5 10*3/uL — ABNORMAL HIGH (ref 4.0–10.5)
WBC: 19.4 10*3/uL — ABNORMAL HIGH (ref 4.0–10.5)

## 2018-05-02 LAB — GLUCOSE, CAPILLARY
Glucose-Capillary: 104 mg/dL — ABNORMAL HIGH (ref 70–99)
Glucose-Capillary: 105 mg/dL — ABNORMAL HIGH (ref 70–99)
Glucose-Capillary: 108 mg/dL — ABNORMAL HIGH (ref 70–99)
Glucose-Capillary: 111 mg/dL — ABNORMAL HIGH (ref 70–99)
Glucose-Capillary: 113 mg/dL — ABNORMAL HIGH (ref 70–99)
Glucose-Capillary: 121 mg/dL — ABNORMAL HIGH (ref 70–99)
Glucose-Capillary: 125 mg/dL — ABNORMAL HIGH (ref 70–99)
Glucose-Capillary: 128 mg/dL — ABNORMAL HIGH (ref 70–99)
Glucose-Capillary: 144 mg/dL — ABNORMAL HIGH (ref 70–99)
Glucose-Capillary: 163 mg/dL — ABNORMAL HIGH (ref 70–99)

## 2018-05-02 LAB — POCT I-STAT, CHEM 8
BUN: 10 mg/dL (ref 8–23)
Calcium, Ion: 1.21 mmol/L (ref 1.15–1.40)
Chloride: 93 mmol/L — ABNORMAL LOW (ref 98–111)
Creatinine, Ser: 0.7 mg/dL (ref 0.61–1.24)
Glucose, Bld: 141 mg/dL — ABNORMAL HIGH (ref 70–99)
HCT: 32 % — ABNORMAL LOW (ref 39.0–52.0)
Hemoglobin: 10.9 g/dL — ABNORMAL LOW (ref 13.0–17.0)
Potassium: 4.1 mmol/L (ref 3.5–5.1)
Sodium: 131 mmol/L — ABNORMAL LOW (ref 135–145)
TCO2: 23 mmol/L (ref 22–32)

## 2018-05-02 LAB — BASIC METABOLIC PANEL
Anion gap: 6 (ref 5–15)
BUN: 9 mg/dL (ref 8–23)
CO2: 25 mmol/L (ref 22–32)
Calcium: 8.2 mg/dL — ABNORMAL LOW (ref 8.9–10.3)
Chloride: 104 mmol/L (ref 98–111)
Creatinine, Ser: 0.6 mg/dL — ABNORMAL LOW (ref 0.61–1.24)
GFR calc Af Amer: >60 mL/min (ref >60)
GFR calc non Af Amer: >60 mL/min (ref >60)
Glucose, Bld: 113 mg/dL — ABNORMAL HIGH (ref 70–99)
Potassium: 4.6 mmol/L (ref 3.5–5.1)
Sodium: 135 mmol/L (ref 135–145)

## 2018-05-02 LAB — CREATININE, SERUM
Creatinine, Ser: 0.79 mg/dL (ref 0.61–1.24)
GFR calc Af Amer: >60 mL/min (ref >60)
GFR calc non Af Amer: >60 mL/min (ref >60)

## 2018-05-02 LAB — MAGNESIUM
Magnesium: 2.4 mg/dL (ref 1.7–2.4)
Magnesium: 2 mg/dL (ref 1.7–2.4)

## 2018-05-02 MED ORDER — CLONAZEPAM 0.25 MG PO TBDP
0.2500 mg | ORAL_TABLET | Freq: Three times a day (TID) | ORAL | Status: DC | PRN
  Administered 2018-05-03 – 2018-05-07 (×5): 0.25 mg via ORAL
  Filled 2018-05-02 (×5): qty 1

## 2018-05-02 MED ORDER — QUETIAPINE FUMARATE 25 MG PO TABS
25.0000 mg | ORAL_TABLET | Freq: Every day | ORAL | Status: DC
  Administered 2018-05-02: 12.5 mg via ORAL
  Administered 2018-05-03 – 2018-05-07 (×5): 25 mg via ORAL
  Filled 2018-05-02 (×7): qty 1

## 2018-05-02 MED ORDER — SODIUM CHLORIDE 0.9 % IV SOLN
INTRAVENOUS | Status: DC | PRN
  Administered 2018-05-02: 1000 mL via INTRAVENOUS

## 2018-05-02 MED ORDER — FUROSEMIDE 10 MG/ML IJ SOLN
20.0000 mg | Freq: Four times a day (QID) | INTRAMUSCULAR | Status: AC
  Administered 2018-05-02 (×3): 20 mg via INTRAVENOUS
  Filled 2018-05-02 (×3): qty 2

## 2018-05-02 MED ORDER — BUPROPION HCL ER (XL) 150 MG PO TB24
150.0000 mg | ORAL_TABLET | Freq: Every day | ORAL | Status: DC
  Administered 2018-05-02 – 2018-05-07 (×6): 150 mg via ORAL
  Filled 2018-05-02 (×6): qty 1

## 2018-05-02 MED ORDER — INSULIN ASPART 100 UNIT/ML ~~LOC~~ SOLN
0.0000 [IU] | SUBCUTANEOUS | Status: DC
  Administered 2018-05-02 (×2): 2 [IU] via SUBCUTANEOUS
  Administered 2018-05-02: 4 [IU] via SUBCUTANEOUS
  Administered 2018-05-02: 2 [IU] via SUBCUTANEOUS

## 2018-05-02 MED ORDER — METOPROLOL TARTRATE 25 MG PO TABS: 25.0000 mg | ORAL_TABLET | Freq: Two times a day (BID) | ORAL | Status: DC

## 2018-05-02 MED ORDER — TAMSULOSIN HCL 0.4 MG PO CAPS
0.4000 mg | ORAL_CAPSULE | Freq: Every day | ORAL | Status: DC
  Administered 2018-05-02 – 2018-05-07 (×6): 0.4 mg via ORAL
  Filled 2018-05-02 (×6): qty 1

## 2018-05-02 MED ORDER — MONTELUKAST SODIUM 10 MG PO TABS
10.0000 mg | ORAL_TABLET | Freq: Every day | ORAL | Status: DC
  Administered 2018-05-03 – 2018-05-08 (×6): 10 mg via ORAL
  Filled 2018-05-02 (×7): qty 1

## 2018-05-02 MED ORDER — DULOXETINE HCL 60 MG PO CPEP
60.0000 mg | ORAL_CAPSULE | Freq: Every day | ORAL | Status: DC
  Administered 2018-05-02 – 2018-05-08 (×7): 60 mg via ORAL
  Filled 2018-05-02 (×7): qty 1

## 2018-05-02 MED ORDER — HYDROCORTISONE 1 % EX CREA
TOPICAL_CREAM | CUTANEOUS | Status: DC | PRN
  Filled 2018-05-02: qty 28

## 2018-05-02 MED ORDER — CARVEDILOL 3.125 MG PO TABS: 3.1250 mg | ORAL_TABLET | Freq: Two times a day (BID) | ORAL | Status: DC

## 2018-05-02 MED ORDER — ENOXAPARIN SODIUM 40 MG/0.4ML ~~LOC~~ SOLN
40.0000 mg | Freq: Every day | SUBCUTANEOUS | Status: DC
  Administered 2018-05-03 – 2018-05-07 (×5): 40 mg via SUBCUTANEOUS
  Filled 2018-05-02 (×5): qty 0.4

## 2018-05-02 NOTE — Progress Notes (Signed)
Advanced Heart Failure Rounding Note  PCP-Cardiologist: No primary care provider on file.   Subjective:     Underwent AVR/Bentall yesterday. Now extubated. Feeling well. No orthopnea or PND. Chest a bit sore.   On dopa and NTG.   Swan #s PA 24/11 Thermo 5.4/2.4    Objective:   Weight Range: 100.9 kg Body mass index is 28.56 kg/m.   Vital Signs:   Temp:  [95.5 F (35.3 C)-99 F (37.2 C)] 99 F (37.2 C) (08/31 0700) Pulse Rate:  [80-112] 112 (08/31 0700) Resp:  [12-24] 21 (08/31 0700) BP: (79-123)/(62-87) 111/82 (08/31 0728) SpO2:  [90 %-100 %] 93 % (08/31 0700) Arterial Line BP: (90-139)/(59-81) 126/80 (08/31 0700) FiO2 (%):  [40 %-50 %] 40 % (08/30 2100) Weight:  [100.9 kg] 100.9 kg (08/31 0426) Last BM Date: 04/30/18  Weight change: Filed Weights   04/30/18 0508 05/01/18 0500 05/02/18 0426  Weight: 92.9 kg 93.1 kg 100.9 kg    Intake/Output:   Intake/Output Summary (Last 24 hours) at 05/02/2018 0913 Last data filed at 05/02/2018 0700 Gross per 24 hour  Intake 5704.52 ml  Output 3950 ml  Net 1754.52 ml      Physical Exam    General:  Sitting up in bed. No resp difficulty HEENT: normal Neck: supple. RIJ swan Carotids 2+ bilat; no bruits. No lymphadenopathy or thryomegaly appreciated. Cor: PMI nondisplaced. Tachy regular  Sternum ok  Lungs: decreased at bases Abdomen: soft, nontender, nondistended. No hepatosplenomegaly. No bruits or masses. Good bowel sounds. Extremities: no cyanosis, clubbing, rash, trace edema Neuro: alert & orientedx3, cranial nerves grossly intact. moves all 4 extremities w/o difficulty. Affect pleasant   Telemetry   S tach 110-120s. Personally reviewed.   Labs    CBC Recent Labs    05/01/18 2112 05/01/18 2150 05/02/18 0353  WBC 17.4*  --  14.5*  HGB 11.4* 10.2* 10.8*  HCT 33.1* 30.0* 31.7*  MCV 103.4*  --  103.3*  PLT 148*  --  428*   Basic Metabolic Panel Recent Labs    05/01/18 0435  05/01/18 2112  05/01/18 2150 05/02/18 0353  NA 130*   < >  --  134* 135  K 4.4   < >  --  4.7 4.6  CL 95*   < >  --  100 104  CO2 28  --   --   --  25  GLUCOSE 109*   < >  --  144* 113*  BUN 9   < >  --  8 9  CREATININE 0.65   < > 0.66 0.50* 0.60*  CALCIUM 9.0  --   --   --  8.2*  MG  --   --  3.0*  --  2.4   < > = values in this interval not displayed.   Liver Function Tests Recent Labs    04/30/18 0422  AST 22  ALT 15  ALKPHOS 49  BILITOT 1.0  PROT 5.3*  ALBUMIN 2.7*   No results for input(s): LIPASE, AMYLASE in the last 72 hours. Cardiac Enzymes No results for input(s): CKTOTAL, CKMB, CKMBINDEX, TROPONINI in the last 72 hours.  BNP: BNP (last 3 results) Recent Labs    04/19/18 1233  BNP 1,549.5*    ProBNP (last 3 results) No results for input(s): PROBNP in the last 8760 hours.   D-Dimer No results for input(s): DDIMER in the last 72 hours. Hemoglobin A1C No results for input(s): HGBA1C in the last 72 hours.  Fasting Lipid Panel No results for input(s): CHOL, HDL, LDLCALC, TRIG, CHOLHDL, LDLDIRECT in the last 72 hours. Thyroid Function Tests No results for input(s): TSH, T4TOTAL, T3FREE, THYROIDAB in the last 72 hours.  Invalid input(s): FREET3  Other results:   Imaging    Dg Chest Port 1 View  Result Date: 05/02/2018 CLINICAL DATA:  Patient status post aortic root replacement 05/01/2018. EXAM: PORTABLE CHEST 1 VIEW COMPARISON:  Single-view of the chest 05/01/2018. FINDINGS: Endotracheal tube and NG tube have been removed. Right IJ approach Swan-Ganz catheter remains in place with the tip in the proximal most right main pulmonary artery. Left basilar atelectasis is unchanged. The lungs appear emphysematous. No pneumothorax. Heart size is normal. IMPRESSION: Status post extubation and NG tube removal. Emphysema. No change in left basilar atelectasis. Electronically Signed   By: Inge Rise M.D.   On: 05/02/2018 08:13   Dg Chest Port 1 View  Result Date:  05/01/2018 CLINICAL DATA:  72 year old male with a history of severe aortic insufficiency and Bentall aortic root replacement EXAM: PORTABLE CHEST 1 VIEW COMPARISON:  04/30/2018, 04/25/2018 FINDINGS: Interval surgical changes of median sternotomy and aortic valve replacement. Endotracheal tube terminates suitably above the carina approximately 9.8 cm. Right IJ sheath with Swan-Ganz catheter that terminates in the pulmonary valve. Gastric tube projects over the mediastinum, terminating out of the field of view. Note that the side port of the G-tube appears above the diaphragm. Mediastinal/pleural drains in place. Low lung volumes. No pneumothorax. Patchy opacities at the lung bases. IMPRESSION: Early surgical changes of median sternotomy and aortic valve repair. Endotracheal tube suitably position above the carina. Right IJ sheath transmits Swan-Ganz catheter. Gastric tube terminates below the field of view, though the side port may be above the GE junction. Mediastinal/pleural drains in place. Low lung volumes and atelectasis with no pneumothorax. Electronically Signed   By: Corrie Mckusick D.O.   On: 05/01/2018 16:16     Medications:     Scheduled Medications: . acetaminophen  1,000 mg Oral Q6H   Or  . acetaminophen (TYLENOL) oral liquid 160 mg/5 mL  1,000 mg Per Tube Q6H  . aspirin EC  325 mg Oral Daily   Or  . aspirin  324 mg Per Tube Daily  . bisacodyl  10 mg Oral Daily   Or  . bisacodyl  10 mg Rectal Daily  . Chlorhexidine Gluconate Cloth  6 each Topical Daily  . docusate sodium  200 mg Oral Daily  . insulin aspart  0-24 Units Subcutaneous Q4H  . mouth rinse  15 mL Mouth Rinse BID  . [START ON 05/03/2018] pantoprazole  40 mg Oral Daily  . sodium chloride flush  3 mL Intravenous Q12H    Infusions: . sodium chloride 20 mL/hr at 05/02/18 0700  . sodium chloride    . sodium chloride 10 mL/hr at 05/01/18 1515  . sodium chloride 10 mL/hr at 05/02/18 0700  . albumin human 250 mL (05/01/18  1552)  . cefUROXime (ZINACEF)  IV Stopped (05/01/18 2240)  . dexmedetomidine (PRECEDEX) IV infusion Stopped (05/01/18 1917)  . DOPamine 3 mcg/kg/min (05/02/18 0700)  . lactated ringers    . lactated ringers    . lactated ringers    . nitroGLYCERIN 10 mcg/min (05/02/18 0700)  . phenylephrine (NEO-SYNEPHRINE) Adult infusion Stopped (05/02/18 0037)    PRN Medications: sodium chloride, sodium chloride, albumin human, lactated ringers, metoprolol tartrate, midazolam, morphine injection, ondansetron (ZOFRAN) IV, oxyCODONE, sodium chloride flush, traMADol    Patient Profile  Travis Reese is a 72 year old with a history of HTN, tobacco abuse, aortic insufficiency, and diastolic heart failure.   CT consulted for AI. CT requested HF team to optimize prior to surgery.   Assessment/Plan    1. Aortic Insufficiency - ECHO with AV severe regurgitation  - s/p AVR/bentall 8/30 POD#1. Doing well - Wean dopamine and IV NTG   2 . A/C Diastolic HF due to #1 - Volume status mildly elevated post-op diurese as tolerated  3. Smoker  - Encouraged cessation. No change.    4. Hyponatremia  - Sodium 135 today - Restrict free H20. Follow closely.   5. Emphysema - Noted on CTA. No change.   6. Mild nonobstructive CAD - LHC 04/27/18: Prox LAD to Mid LAD lesion is 40% stenosed. - LDL 53 8/28.  - Will need statin and ASA on d/c  D/w Dr. Roxy Manns   Medication concerns reviewed with patient and pharmacy team. Barriers identified: none   Length of Stay: 13  Glori Bickers, MD  05/02/2018, 9:13 AM  Advanced Heart Failure Team Pager [phone] (M-F; 7a - 4p)  Please contact [facility] for night-coverage after hours (4p -7a ) and weekends on [email or web address]

## 2018-05-02 NOTE — Progress Notes (Addendum)
TCTS BRIEF SICU PROGRESS NOTE  1 Day Post-Op  S/P Procedure(s) (LRB): BIOLOGICAL BENTALL PROCEDURE AORTIC ROOT REPLACEMENT WITH 30MM VALSALVA GRAFT & 27MM INSPIRIS VALVE. (N/A) TRANSESOPHAGEAL ECHOCARDIOGRAM (TEE) (N/A)   Stable day Maintaining NSR/sinus tach w/ stable BP Breathing comfortably on room air UOP adequate  Plan: Restart beta blocker  Rexene Alberts, MD 05/02/2018 6:05 PM

## 2018-05-02 NOTE — Plan of Care (Signed)
  Problem: Health Behavior/Discharge Planning: Goal: Ability to manage health-related needs will improve Outcome: Progressing   Problem: Clinical Measurements: Goal: Ability to maintain clinical measurements within normal limits will improve Outcome: Progressing Goal: Diagnostic test results will improve Outcome: Progressing Goal: Respiratory complications will improve Outcome: Progressing Goal: Cardiovascular complication will be avoided Outcome: Progressing   Problem: Coping: Goal: Level of anxiety will decrease Outcome: Progressing   Problem: Pain Managment: Goal: General experience of comfort will improve Outcome: Progressing   Problem: Safety: Goal: Ability to remain free from injury will improve Outcome: Progressing   Problem: Skin Integrity: Goal: Risk for impaired skin integrity will decrease Outcome: Progressing   Problem: Activity: Goal: Capacity to carry out activities will improve Outcome: Progressing   Problem: Cardiac: Goal: Ability to achieve and maintain adequate cardiopulmonary perfusion will improve Outcome: Progressing   Problem: Activity: Goal: Ability to return to baseline activity level will improve Outcome: Progressing   Problem: Cardiovascular: Goal: Ability to achieve and maintain adequate cardiovascular perfusion will improve Outcome: Progressing Goal: Vascular access site(s) Level 0-1 will be maintained Outcome: Progressing   Problem: Health Behavior/Discharge Planning: Goal: Ability to safely manage health-related needs after discharge will improve Outcome: Progressing   Problem: Education: Goal: Knowledge of the prescribed therapeutic regimen will improve Outcome: Progressing   Problem: Activity: Goal: Risk for activity intolerance will decrease Outcome: Progressing   Problem: Cardiac: Goal: Will achieve and/or maintain hemodynamic stability Outcome: Progressing   Problem: Clinical Measurements: Goal: Postoperative  complications will be avoided or minimized Outcome: Progressing   Problem: Respiratory: Goal: Respiratory status will improve Outcome: Progressing   Problem: Skin Integrity: Goal: Wound healing without signs and symptoms of infection Outcome: Progressing Goal: Risk for impaired skin integrity will decrease Outcome: Progressing   Problem: Urinary Elimination: Goal: Ability to achieve and maintain adequate renal perfusion and functioning will improve Outcome: Progressing

## 2018-05-02 NOTE — Progress Notes (Signed)
[address]       [place] [address]             [phone]        CARDIOTHORACIC SURGERY PROGRESS NOTE   R1 Day Post-Op Procedure(s) (LRB): BIOLOGICAL BENTALL PROCEDURE AORTIC ROOT REPLACEMENT WITH 30MM VALSALVA GRAFT & 27MM INSPIRIS VALVE. (N/A) TRANSESOPHAGEAL ECHOCARDIOGRAM (TEE) (N/A)  Subjective: Looks great.  Minimal soreness in chest.  No SOB.  Reports that he feels instantly better, like he used to feel 2 months ago.  No nausea.  Objective: Vital signs: BP Readings from Last 1 Encounters:  05/02/18 111/82   Pulse Readings from Last 1 Encounters:  05/02/18 (!) 112   Resp Readings from Last 1 Encounters:  05/02/18 (!) 21   Temp Readings from Last 1 Encounters:  05/02/18 99 F (37.2 C)    Hemodynamics: PAP: (20-41)/(8-27) 26/20 CO:  [2.6 L/min-5.4 L/min] 5.4 L/min CI:  [1.2 L/min/m2-2.4 L/min/m2] 2.4 L/min/m2  Physical Exam:  Rhythm:   sinus  Breath sounds: clear  Heart sounds:  RRR w/out murmur  Incisions:  Dressings dry, intact  Abdomen:  Soft, non-distended, non-tender  Extremities:  Warm, well-perfused  Chest tubes:  low volume thin serosanguinous output, no air leak    Intake/Output from previous day: 08/30 0701 - 08/31 0700 In: 5704.5 [P.O.:240; I.V.:4029; Blood:720; IV Piggyback:715.6] Out: 4081 [KGYJE:5631; Blood:765; Chest Tube:340] Intake/Output this shift: Total I/O In: -  Out: 125 [Urine:125]  Lab Results:  CBC: Recent Labs    05/01/18 2112 05/01/18 2150 05/02/18 0353  WBC 17.4*  --  14.5*  HGB 11.4* 10.2* 10.8*  HCT 33.1* 30.0* 31.7*  PLT 148*  --  146*    BMET:  Recent Labs    05/01/18 0435  05/01/18 2150 05/02/18 0353  NA 130*   < > 134* 135  K 4.4   < > 4.7 4.6  CL 95*   < > 100 104  CO2 28  --   --  25  GLUCOSE 109*   < > 144* 113*  BUN 9   < > 8 9  CREATININE 0.65   < > 0.50* 0.60*  CALCIUM 9.0  --   --  8.2*   < > = values in this interval not displayed.     PT/INR:   Recent Labs     05/01/18 1508  LABPROT 18.7*  INR 1.58    CBG (last 3)  Recent Labs    05/02/18 0231 05/02/18 0351 05/02/18 0730  GLUCAP 108* 105* 111*    ABG    Component Value Date/Time   PHART 7.327 (L) 05/01/2018 2236   PCO2ART 45.3 05/01/2018 2236   PO2ART 96.0 05/01/2018 2236   HCO3 23.8 05/01/2018 2236   TCO2 25 05/01/2018 2236   ACIDBASEDEF 2.0 05/01/2018 2236   O2SAT 97.0 05/01/2018 2236    CXR: PORTABLE CHEST 1 VIEW  COMPARISON:  Single-view of the chest 05/01/2018.  FINDINGS: Endotracheal tube and NG tube have been removed. Right IJ approach Swan-Ganz catheter remains in place with the tip in the proximal most right main pulmonary artery. Left basilar atelectasis is unchanged. The lungs appear emphysematous. No pneumothorax. Heart size is normal.  IMPRESSION: Status post extubation and NG tube removal.  Emphysema.  No change in left basilar atelectasis.   Electronically Signed   By: Inge Rise M.D.   On: 05/02/2018 08:13  EKG: Sinus tach w/out acute ischemic changes, RBBB (new)   Assessment/Plan: S/P  Procedure(s) (LRB): BIOLOGICAL BENTALL PROCEDURE AORTIC ROOT REPLACEMENT WITH 30MM VALSALVA GRAFT & 27MM INSPIRIS VALVE. (N/A) TRANSESOPHAGEAL ECHOCARDIOGRAM (TEE) (N/A)  Doing well POD1 Maintaining NSR - sinus tach w/ stable hemodynamics, on NTG drip for hypertension, dopamine 3 mcg/kg/min Breathing comfortably w/ O2 sats 95% on 1 L/min, CXR clear Acute diastolic CHF with expected post-op volume excess, weight up 7 kg Expected post op acute blood loss anemia, very mild Expected post op atelectasis, very mild   D/C dopamine  Mobilize  Diuresis  D/C lines D/C tubes later today or tomorrow, depending on output    Rexene Alberts, MD 05/02/2018 9:31 AM

## 2018-05-03 ENCOUNTER — Inpatient Hospital Stay (HOSPITAL_COMMUNITY): Payer: Medicare Other

## 2018-05-03 LAB — BASIC METABOLIC PANEL
Anion gap: 6 (ref 5–15)
BUN: 11 mg/dL (ref 8–23)
CO2: 26 mmol/L (ref 22–32)
Calcium: 7.8 mg/dL — ABNORMAL LOW (ref 8.9–10.3)
Chloride: 100 mmol/L (ref 98–111)
Creatinine, Ser: 0.67 mg/dL (ref 0.61–1.24)
GFR calc Af Amer: >60 mL/min (ref >60)
GFR calc non Af Amer: >60 mL/min (ref >60)
Glucose, Bld: 101 mg/dL — ABNORMAL HIGH (ref 70–99)
Potassium: 3.6 mmol/L (ref 3.5–5.1)
Sodium: 132 mmol/L — ABNORMAL LOW (ref 135–145)

## 2018-05-03 LAB — GLUCOSE, CAPILLARY
Glucose-Capillary: 100 mg/dL — ABNORMAL HIGH (ref 70–99)
Glucose-Capillary: 123 mg/dL — ABNORMAL HIGH (ref 70–99)

## 2018-05-03 LAB — CBC
HCT: 28.4 % — ABNORMAL LOW (ref 39.0–52.0)
Hemoglobin: 9.6 g/dL — ABNORMAL LOW (ref 13.0–17.0)
MCH: 35 pg — ABNORMAL HIGH (ref 26.0–34.0)
MCHC: 33.8 g/dL (ref 30.0–36.0)
MCV: 103.6 fL — ABNORMAL HIGH (ref 78.0–100.0)
Platelets: 125 10*3/uL — ABNORMAL LOW (ref 150–400)
RBC: 2.74 MIL/uL — ABNORMAL LOW (ref 4.22–5.81)
RDW: 12.9 % (ref 11.5–15.5)
WBC: 14.3 10*3/uL — ABNORMAL HIGH (ref 4.0–10.5)

## 2018-05-03 LAB — POTASSIUM: Potassium: 3.8 mmol/L (ref 3.5–5.1)

## 2018-05-03 MED ORDER — CARVEDILOL 12.5 MG PO TABS
12.5000 mg | ORAL_TABLET | Freq: Two times a day (BID) | ORAL | Status: DC
  Administered 2018-05-03 – 2018-05-05 (×4): 12.5 mg via ORAL
  Filled 2018-05-03 (×4): qty 1

## 2018-05-03 MED ORDER — SODIUM CHLORIDE 0.9% FLUSH: 3.0000 mL | INTRAVENOUS | Status: DC | PRN

## 2018-05-03 MED ORDER — SODIUM CHLORIDE 0.9% FLUSH
3.0000 mL | Freq: Two times a day (BID) | INTRAVENOUS | Status: DC
  Administered 2018-05-03 – 2018-05-07 (×6): 3 mL via INTRAVENOUS

## 2018-05-03 MED ORDER — ALBUMIN HUMAN 5 % IV SOLN
12.5000 g | Freq: Once | INTRAVENOUS | Status: AC
  Administered 2018-05-03: 12.5 g via INTRAVENOUS
  Filled 2018-05-03: qty 250

## 2018-05-03 MED ORDER — AMIODARONE HCL IN DEXTROSE 360-4.14 MG/200ML-% IV SOLN
30.0000 mg/h | INTRAVENOUS | Status: AC
  Administered 2018-05-03 – 2018-05-04 (×2): 30 mg/h via INTRAVENOUS
  Filled 2018-05-03: qty 200

## 2018-05-03 MED ORDER — POTASSIUM CHLORIDE CRYS ER 20 MEQ PO TBCR
20.0000 meq | EXTENDED_RELEASE_TABLET | ORAL | Status: DC
  Administered 2018-05-03: 20 meq via ORAL
  Filled 2018-05-03: qty 1

## 2018-05-03 MED ORDER — POTASSIUM CHLORIDE 10 MEQ/50ML IV SOLN
10.0000 meq | INTRAVENOUS | Status: AC
  Administered 2018-05-03 (×6): 10 meq via INTRAVENOUS
  Filled 2018-05-03 (×6): qty 50

## 2018-05-03 MED ORDER — PHENYLEPHRINE HCL-NACL 10-0.9 MG/250ML-% IV SOLN
0.0000 ug/min | INTRAVENOUS | Status: DC
  Filled 2018-05-03: qty 250

## 2018-05-03 MED ORDER — SODIUM CHLORIDE 0.9 % IV SOLN
250.0000 mL | INTRAVENOUS | Status: DC | PRN
  Administered 2018-05-06 (×2): 250 mL via INTRAVENOUS

## 2018-05-03 MED ORDER — POTASSIUM CHLORIDE CRYS ER 20 MEQ PO TBCR
20.0000 meq | EXTENDED_RELEASE_TABLET | Freq: Two times a day (BID) | ORAL | Status: DC
  Administered 2018-05-03 – 2018-05-04 (×4): 20 meq via ORAL
  Filled 2018-05-03 (×4): qty 1

## 2018-05-03 MED ORDER — AMIODARONE LOAD VIA INFUSION
150.0000 mg | Freq: Once | INTRAVENOUS | Status: DC
  Filled 2018-05-03: qty 83.34

## 2018-05-03 MED ORDER — AMIODARONE HCL IN DEXTROSE 360-4.14 MG/200ML-% IV SOLN
INTRAVENOUS | Status: AC
  Administered 2018-05-03: 60 mg/h via INTRAVENOUS
  Filled 2018-05-03: qty 200

## 2018-05-03 MED ORDER — MOVING RIGHT ALONG BOOK
Freq: Once | Status: AC
  Administered 2018-05-03: 09:00:00
  Filled 2018-05-03: qty 1

## 2018-05-03 MED ORDER — FUROSEMIDE 10 MG/ML IJ SOLN
20.0000 mg | Freq: Four times a day (QID) | INTRAMUSCULAR | Status: DC
  Administered 2018-05-03: 20 mg via INTRAVENOUS
  Filled 2018-05-03: qty 2

## 2018-05-03 MED ORDER — AMIODARONE LOAD VIA INFUSION
150.0000 mg | Freq: Once | INTRAVENOUS | Status: AC
  Administered 2018-05-03: 150 mg via INTRAVENOUS
  Filled 2018-05-03: qty 83.34

## 2018-05-03 MED ORDER — FUROSEMIDE 10 MG/ML IJ SOLN
10.0000 mg/h | INTRAVENOUS | Status: AC
  Administered 2018-05-03: 8 mg/h via INTRAVENOUS
  Filled 2018-05-03 (×2): qty 25
  Filled 2018-05-03: qty 21

## 2018-05-03 MED ORDER — AMIODARONE HCL IN DEXTROSE 360-4.14 MG/200ML-% IV SOLN
60.0000 mg/h | INTRAVENOUS | Status: AC
  Administered 2018-05-03 (×2): 60 mg/h via INTRAVENOUS
  Filled 2018-05-03: qty 400

## 2018-05-03 NOTE — Progress Notes (Signed)
Patient hypotensive.  Patient was in atrial fibrillation with symptoms of diaphoresis, SOB, and general malaise.  Patient converted to NSR at 0845, RN held amiodarone bolus due to hypotension and patients rhythm change to NSR.  Patient continues to be diaphoretic and hypotensive even with normal sinus rhythm.  Paged MD Roxy Manns and new orders to restart patient's pacemaker at HR of 80, hold future lasix doses, and give patient albumin received.

## 2018-05-03 NOTE — Progress Notes (Signed)
TCTS BRIEF SICU PROGRESS NOTE  2 Days Post-Op  S/P Procedure(s) (LRB): BIOLOGICAL BENTALL PROCEDURE AORTIC ROOT REPLACEMENT WITH 30MM VALSALVA GRAFT & 27MM INSPIRIS VALVE. (N/A) TRANSESOPHAGEAL ECHOCARDIOGRAM (TEE) (N/A)   Back in NSR this afternoon, BP improved Breathing comfortably w/ O2 sats 99-100% on RA UOP increasing on lasix drip  Plan: Continue current plan  Rexene Alberts, MD 05/03/2018 6:15 PM

## 2018-05-03 NOTE — Progress Notes (Signed)
  Amiodarone Drug - Drug Interaction Consult Note  Recommendations: Monitor QTc, zofran use, vitals Amiodarone is metabolized by the cytochrome P450 system and therefore has the potential to cause many drug interactions. Amiodarone has an average plasma half-life of 50 days (range 20 to 100 days).   There is potential for drug interactions to occur several weeks or months after stopping treatment and the onset of drug interactions may be slow after initiating amiodarone.   []  Statins: Increased risk of myopathy. Simvastatin- restrict dose to 20mg  daily. Other statins: counsel patients to report any muscle pain or weakness immediately.  []  Anticoagulants: Amiodarone can increase anticoagulant effect. Consider warfarin dose reduction. Patients should be monitored closely and the dose of anticoagulant altered accordingly, remembering that amiodarone levels take several weeks to stabilize.  []  Antiepileptics: Amiodarone can increase plasma concentration of phenytoin, the dose should be reduced. Note that small changes in phenytoin dose can result in large changes in levels. Monitor patient and counsel on signs of toxicity.  [x]  Beta blockers: increased risk of bradycardia, AV block and myocardial depression. Sotalol - avoid concomitant use.  []   Calcium channel blockers (diltiazem and verapamil): increased risk of bradycardia, AV block and myocardial depression.  []   Cyclosporine: Amiodarone increases levels of cyclosporine. Reduced dose of cyclosporine is recommended.  []  Digoxin dose should be halved when amiodarone is started.  []  Diuretics: increased risk of cardiotoxicity if hypokalemia occurs.  []  Oral hypoglycemic agents (glyburide, glipizide, glimepiride): increased risk of hypoglycemia. Patient's glucose levels should be monitored closely when initiating amiodarone therapy.   [x]  Drugs that prolong the QT interval:  Torsades de pointes risk may be increased with concurrent use - avoid  if possible.  Monitor QTc, also keep magnesium/potassium WNL if concurrent therapy can't be avoided. Marland Kitchen Antibiotics: e.g. fluoroquinolones, erythromycin. . Antiarrhythmics: e.g. quinidine, procainamide, disopyramide, sotalol. . Antipsychotics: e.g. phenothiazines, haloperidol.  . Lithium, tricyclic antidepressants, and methadone. Thank You,  Narda Bonds  05/03/2018 5:07 AM

## 2018-05-03 NOTE — Progress Notes (Signed)
Patient noted to convert from SR/ST to A-fib with RVR HR 120-150s. Patient denies chest pain or palpitation, but began developing shortness of breath as A-fib with RVR sustained. Supplemental oxygen via nasal cannula increased up to 4L. PRN IV Metoprolol administered as ordered. Patients BP stable despite rhythm change and Metoprolol administration. Metoprolol ineffective at managing patients HR less than 110 or assisting patient to convert back to SR/ST. Attending MD notified and updated. Orders received for Amio bolus and infusion to be administered. Orders carried out. Amio bolus completed without complication. Bolus ineffective at managing patients HR to less than 110 or convert patient back to SR/ST. Patient remains A-fib with RVR; HR 110-130s. Patients rhythm noted to attempt to convert to SR with HR 80s-90s multiple times after Metoprolol administration and Amio bolus/infusion started, but patient goes back into A Fib with RVR. Will continue to monitor.

## 2018-05-03 NOTE — Progress Notes (Signed)
[address]       [place] [address]             [phone]        CARDIOTHORACIC SURGERY PROGRESS NOTE   R2 Days Post-Op Procedure(s) (LRB): BIOLOGICAL BENTALL PROCEDURE AORTIC ROOT REPLACEMENT WITH 30MM VALSALVA GRAFT & 27MM INSPIRIS VALVE. (N/A) TRANSESOPHAGEAL ECHOCARDIOGRAM (TEE) (N/A)  Subjective: Feels okay but went into rapid Afib this morning, HR 140  Objective: Vital signs: BP Readings from Last 1 Encounters:  05/03/18 (!) 80/64   Pulse Readings from Last 1 Encounters:  05/03/18 63   Resp Readings from Last 1 Encounters:  05/03/18 (!) 23   Temp Readings from Last 1 Encounters:  05/03/18 (!) 97.4 F (36.3 C) (Oral)    Hemodynamics: PAP: (25-27)/(15-16) 25/15 CO:  [4.6 L/min] 4.6 L/min CI:  [2.1 L/min/m2] 2.1 L/min/m2  Physical Exam:  Rhythm:   Afib  Breath sounds: clear  Heart sounds:  irregular  Incisions:  Dressings dry, intact  Abdomen:  Soft, non-distended, non-tender  Extremities:  Warm, well-perfused  Chest tubes:  low volume thin serosanguinous output, no air leak    Intake/Output from previous day: 08/31 0701 - 09/01 0700 In: 2744.3 [P.O.:2100; I.V.:444.3; IV Piggyback:200] Out: 2213 [Urine:1763; Chest Tube:450] Intake/Output this shift: Total I/O In: -  Out: 20 [Chest Tube:20]  Lab Results:  CBC: Recent Labs    05/02/18 1740 05/02/18 1752 05/03/18 0433  WBC 19.4*  --  14.3*  HGB 11.0* 10.9* 9.6*  HCT 33.0* 32.0* 28.4*  PLT 178  --  125*    BMET:  Recent Labs    05/02/18 0353  05/02/18 1752 05/03/18 0433  NA 135  --  131* 132*  K 4.6  --  4.1 3.6  CL 104  --  93* 100  CO2 25  --   --  26  GLUCOSE 113*  --  141* 101*  BUN 9  --  10 11  CREATININE 0.60*   < > 0.70 0.67  CALCIUM 8.2*  --   --  7.8*   < > = values in this interval not displayed.     PT/INR:   Recent Labs    05/01/18 1508  LABPROT 18.7*  INR 1.58    CBG (last 3)  Recent Labs    05/02/18 2340 05/03/18 0347  05/03/18 0726  GLUCAP 121* 100* 123*    ABG    Component Value Date/Time   PHART 7.327 (L) 05/01/2018 2236   PCO2ART 45.3 05/01/2018 2236   PO2ART 96.0 05/01/2018 2236   HCO3 23.8 05/01/2018 2236   TCO2 23 05/02/2018 1752   ACIDBASEDEF 2.0 05/01/2018 2236   O2SAT 97.0 05/01/2018 2236    CXR: PORTABLE CHEST 1 VIEW  COMPARISON:  Prior chest x-ray 05/02/2018  FINDINGS: The Swan-Ganz catheter has been removed. The right IJ Cordis sheath remains in place with the tip overlying the confluence of the right innominate and internal jugular veins. Mediastinal drain remains in place. Patient is status post median sternotomy with evidence of aortic valve replacement. Stable cardiac and mediastinal contours. Atherosclerotic calcifications again noted in the transverse aorta. Persistent left lower lobe atelectasis and likely small left pleural effusion. Chronic interstitial prominence and bronchitic changes suggest underlying parenchymal lung disease (emphysema). No new pulmonary edema. No evidence of pneumothorax. No acute osseous abnormality.  IMPRESSION: 1. Interval removal of Swan-Ganz catheter. Other support apparatus remain in stable and satisfactory position. 2. Persistent left lower lobe atelectasis  and likely small pleural effusion. 3. Stable background chronic lung disease without evidence of new pulmonary edema or airspace opacity. 4.  Aortic Atherosclerosis (ICD10-170.0)   Electronically Signed   By: Jacqulynn Cadet M.D.   On: 05/03/2018 08:38  Assessment/Plan: S/P Procedure(s) (LRB): BIOLOGICAL BENTALL PROCEDURE AORTIC ROOT REPLACEMENT WITH 30MM VALSALVA GRAFT & 27MM INSPIRIS VALVE. (N/A) TRANSESOPHAGEAL ECHOCARDIOGRAM (TEE) (N/A)  Stable POD2 but now in rapid Afib Breathing comfortably w/ O2 sats 100% on 4 L/min, CXR clear Acute diastolic CHF with expected post-op volume excess, weight up 7 kg Expected post op acute blood loss anemia, mild Expected post  op atelectasis, very mild   IV amiodarone, increase metoprolol  Hold diuresis  D/C tubes later today or tomorrow, depending on output  Supplement potassium  Rexene Alberts, MD 05/03/2018 8:42 AM

## 2018-05-03 NOTE — Progress Notes (Signed)
Advanced Heart Failure Rounding Note  PCP-Cardiologist: No primary care provider on file.   Subjective:     Underwent AVR/Bentall 8/30   This am developed AF with RVR and hypotension. Amio started and albumin given. Now back in NSR. SBP 90s. Pacing.   Passing gas but no BM. Chest sore. Weight still up 20 pounds from baseline.   Objective:   Weight Range: 101.2 kg Body mass index is 28.65 kg/m.   Vital Signs:   Temp:  [97.4 F (36.3 C)-98.7 F (37.1 C)] 97.7 F (36.5 C) (09/01 1147) Pulse Rate:  [56-148] 80 (09/01 1200) Resp:  [14-27] 18 (09/01 1200) BP: (71-141)/(58-99) 96/82 (09/01 1200) SpO2:  [91 %-100 %] 100 % (09/01 1200) Arterial Line BP: (145)/(76-81) 145/76 (08/31 1400) Weight:  [101.2 kg] 101.2 kg (09/01 0600) Last BM Date: 04/30/18  Weight change: Filed Weights   05/01/18 0500 05/02/18 0426 05/03/18 0600  Weight: 93.1 kg 100.9 kg 101.2 kg    Intake/Output:   Intake/Output Summary (Last 24 hours) at 05/03/2018 1230 Last data filed at 05/03/2018 1200 Gross per 24 hour  Intake 2848.01 ml  Output 1758 ml  Net 1090.01 ml      Physical Exam    General:  Sitting up in bed. No resp difficulty HEENT: normal Neck: supple. JVP to jaw. Carotids 2+ bilat; no bruits. No lymphadenopathy or thryomegaly appreciated. Cor: Sternal wound ok PMI nondisplaced. Regular rate & rhythm. No rubs, gallops or murmurs. Lungs: decreased at bases Abdomen: soft, nontender, + distended. Hypoactive BS Extremities: no cyanosis, clubbing, rash, edema Neuro: alert & orientedx3, cranial nerves grossly intact. moves all 4 extremities w/o difficulty. Affect pleasant   Telemetry   Apacing 80. Personally reviewed.   Labs    CBC Recent Labs    05/02/18 1740 05/02/18 1752 05/03/18 0433  WBC 19.4*  --  14.3*  HGB 11.0* 10.9* 9.6*  HCT 33.0* 32.0* 28.4*  MCV 104.1*  --  103.6*  PLT 178  --  426*   Basic Metabolic Panel Recent Labs    05/02/18 0353 05/02/18 1740  05/02/18 1752 05/03/18 0433  NA 135  --  131* 132*  K 4.6  --  4.1 3.6  CL 104  --  93* 100  CO2 25  --   --  26  GLUCOSE 113*  --  141* 101*  BUN 9  --  10 11  CREATININE 0.60* 0.79 0.70 0.67  CALCIUM 8.2*  --   --  7.8*  MG 2.4 2.0  --   --    Liver Function Tests No results for input(s): AST, ALT, ALKPHOS, BILITOT, PROT, ALBUMIN in the last 72 hours. No results for input(s): LIPASE, AMYLASE in the last 72 hours. Cardiac Enzymes No results for input(s): CKTOTAL, CKMB, CKMBINDEX, TROPONINI in the last 72 hours.  BNP: BNP (last 3 results) Recent Labs    04/19/18 1233  BNP 1,549.5*    ProBNP (last 3 results) No results for input(s): PROBNP in the last 8760 hours.   D-Dimer No results for input(s): DDIMER in the last 72 hours. Hemoglobin A1C No results for input(s): HGBA1C in the last 72 hours. Fasting Lipid Panel No results for input(s): CHOL, HDL, LDLCALC, TRIG, CHOLHDL, LDLDIRECT in the last 72 hours. Thyroid Function Tests No results for input(s): TSH, T4TOTAL, T3FREE, THYROIDAB in the last 72 hours.  Invalid input(s): FREET3  Other results:   Imaging    Dg Chest Port 1 View  Result Date: 05/03/2018 CLINICAL  DATA:  72 year old male with atelectasis EXAM: PORTABLE CHEST 1 VIEW COMPARISON:  Prior chest x-ray 05/02/2018 FINDINGS: The Swan-Ganz catheter has been removed. The right IJ Cordis sheath remains in place with the tip overlying the confluence of the right innominate and internal jugular veins. Mediastinal drain remains in place. Patient is status post median sternotomy with evidence of aortic valve replacement. Stable cardiac and mediastinal contours. Atherosclerotic calcifications again noted in the transverse aorta. Persistent left lower lobe atelectasis and likely small left pleural effusion. Chronic interstitial prominence and bronchitic changes suggest underlying parenchymal lung disease (emphysema). No new pulmonary edema. No evidence of pneumothorax. No  acute osseous abnormality. IMPRESSION: 1. Interval removal of Swan-Ganz catheter. Other support apparatus remain in stable and satisfactory position. 2. Persistent left lower lobe atelectasis and likely small pleural effusion. 3. Stable background chronic lung disease without evidence of new pulmonary edema or airspace opacity. 4.  Aortic Atherosclerosis (ICD10-170.0) Electronically Signed   By: Jacqulynn Cadet M.D.   On: 05/03/2018 08:38     Medications:     Scheduled Medications: . acetaminophen  1,000 mg Oral Q6H  . amiodarone  150 mg Intravenous Once  . aspirin EC  325 mg Oral Daily  . bisacodyl  10 mg Oral Daily   Or  . bisacodyl  10 mg Rectal Daily  . buPROPion  150 mg Oral Daily  . carvedilol  12.5 mg Oral BID WC  . Chlorhexidine Gluconate Cloth  6 each Topical Daily  . docusate sodium  200 mg Oral Daily  . DULoxetine  60 mg Oral Daily  . enoxaparin (LOVENOX) injection  40 mg Subcutaneous QHS  . furosemide  20 mg Intravenous Q6H  . mouth rinse  15 mL Mouth Rinse BID  . montelukast  10 mg Oral Daily  . pantoprazole  40 mg Oral Daily  . potassium chloride  20 mEq Oral BID  . QUEtiapine  25 mg Oral QHS  . sodium chloride flush  3 mL Intravenous Q12H  . sodium chloride flush  3 mL Intravenous Q12H  . tamsulosin  0.4 mg Oral QPC supper    Infusions: . sodium chloride    . sodium chloride Stopped (05/03/18 1157)  . sodium chloride    . amiodarone 30 mg/hr (05/03/18 1200)  . lactated ringers    . phenylephrine (NEO-SYNEPHRINE) Adult infusion    . potassium chloride 50 mL/hr at 05/03/18 1158    PRN Medications: sodium chloride, sodium chloride, clonazePAM, hydrocortisone cream, metoprolol tartrate, morphine injection, ondansetron (ZOFRAN) IV, oxyCODONE, sodium chloride flush, sodium chloride flush, traMADol    Patient Profile   Travis Reese is a 72 year old with a history of HTN, tobacco abuse, aortic insufficiency, and diastolic heart failure.   CT consulted for AI.  CT requested HF team to optimize prior to surgery.   Assessment/Plan    1. Aortic Insufficiency - ECHO with AV severe regurgitation  - s/p AVR/bentall 8/30 POD#2. Doing well remains volume overloaded.  - Will start low-dose neo and switch to lasix gtt   2 . A/C Diastolic HF due to #1 - As above weight up 20 pounds. Will start low-dose neo and switch to lasix gtt  3. Smoker  - Encouraged cessation. No change.    4. Hyponatremia  - Sodium 132 today - Restrict free H20. Follow closely.   5. Emphysema - Noted on CTA. No change.   6. Mild nonobstructive CAD - LHC 04/27/18: Prox LAD to Mid LAD lesion is 40% stenosed. -  LDL 53 8/28.  - Will need statin and ASA on d/c  7. Post-op AF with hypotension - back in NSR on amio gtt. Will continue.     Length of Stay: [place], [place]  05/03/2018, 12:30 PM  Advanced Heart Failure Team Pager [phone] (M-F; 7a - 4p)  Please contact [facility] for night-coverage after hours (4p -7a ) and weekends on [email or web address]

## 2018-05-04 ENCOUNTER — Inpatient Hospital Stay (HOSPITAL_COMMUNITY): Payer: Medicare Other

## 2018-05-04 LAB — BASIC METABOLIC PANEL
Anion gap: 5 (ref 5–15)
BUN: 9 mg/dL (ref 8–23)
CO2: 30 mmol/L (ref 22–32)
Calcium: 8 mg/dL — ABNORMAL LOW (ref 8.9–10.3)
Chloride: 96 mmol/L — ABNORMAL LOW (ref 98–111)
Creatinine, Ser: 0.73 mg/dL (ref 0.61–1.24)
GFR calc Af Amer: >60 mL/min (ref >60)
GFR calc non Af Amer: >60 mL/min (ref >60)
Glucose, Bld: 113 mg/dL — ABNORMAL HIGH (ref 70–99)
Potassium: 3.4 mmol/L — ABNORMAL LOW (ref 3.5–5.1)
Sodium: 131 mmol/L — ABNORMAL LOW (ref 135–145)

## 2018-05-04 LAB — CBC
HCT: 26.5 % — ABNORMAL LOW (ref 39.0–52.0)
Hemoglobin: 9.1 g/dL — ABNORMAL LOW (ref 13.0–17.0)
MCH: 35.5 pg — ABNORMAL HIGH (ref 26.0–34.0)
MCHC: 34.3 g/dL (ref 30.0–36.0)
MCV: 103.5 fL — ABNORMAL HIGH (ref 78.0–100.0)
Platelets: 136 10*3/uL — ABNORMAL LOW (ref 150–400)
RBC: 2.56 MIL/uL — ABNORMAL LOW (ref 4.22–5.81)
RDW: 12.8 % (ref 11.5–15.5)
WBC: 12 10*3/uL — ABNORMAL HIGH (ref 4.0–10.5)

## 2018-05-04 MED ORDER — POTASSIUM CHLORIDE 10 MEQ/50ML IV SOLN
10.0000 meq | INTRAVENOUS | Status: DC
  Administered 2018-05-04 (×3): 10 meq via INTRAVENOUS
  Filled 2018-05-04 (×4): qty 50

## 2018-05-04 MED ORDER — FUROSEMIDE 40 MG PO TABS
40.0000 mg | ORAL_TABLET | Freq: Two times a day (BID) | ORAL | Status: DC
  Filled 2018-05-04: qty 1

## 2018-05-04 MED ORDER — AMIODARONE HCL 200 MG PO TABS
400.0000 mg | ORAL_TABLET | Freq: Two times a day (BID) | ORAL | Status: DC
  Administered 2018-05-04 – 2018-05-07 (×7): 400 mg via ORAL
  Filled 2018-05-04 (×7): qty 2

## 2018-05-04 MED ORDER — POTASSIUM CHLORIDE 10 MEQ/50ML IV SOLN
10.0000 meq | INTRAVENOUS | Status: AC
  Administered 2018-05-04 (×3): 10 meq via INTRAVENOUS
  Filled 2018-05-04 (×3): qty 50

## 2018-05-04 MED ORDER — POTASSIUM CHLORIDE 10 MEQ/50ML IV SOLN
10.0000 meq | INTRAVENOUS | Status: AC
  Administered 2018-05-04 (×2): 10 meq via INTRAVENOUS
  Filled 2018-05-04 (×2): qty 50

## 2018-05-04 MED ORDER — POTASSIUM CHLORIDE 10 MEQ/50ML IV SOLN: 10.0000 meq | INTRAVENOUS | Status: DC | PRN

## 2018-05-04 NOTE — Progress Notes (Signed)
Advanced Heart Failure Rounding Note  PCP-Cardiologist: No primary care provider on file.   Subjective:     Underwent AVR/Bentall 8/30   Developed AF on 9/1 started on IV amio.   Still in SR on amio, not SOB,  feeling better. Chest sore when he coughs otherwise no cp. Has had good BM. Diuresing well on lasix gtt. Weight down 6 pounds     Objective:   Weight Range: 98.5 kg Body mass index is 27.89 kg/m.   Vital Signs:   Temp:  [97.7 F (36.5 C)-98.6 F (37 C)] 98.1 F (36.7 C) (09/02 0734) Pulse Rate:  [79-103] 95 (09/02 0800) Resp:  [13-25] 20 (09/02 0800) BP: (83-141)/(61-96) 111/77 (09/02 0800) SpO2:  [96 %-100 %] 97 % (09/02 0800) Weight:  [98.5 kg] 98.5 kg (09/02 0600) Last BM Date: 05/03/18  Weight change: Filed Weights   05/02/18 0426 05/03/18 0600 05/04/18 0600  Weight: 100.9 kg 101.2 kg 98.5 kg    Intake/Output:   Intake/Output Summary (Last 24 hours) at 05/04/2018 0958 Last data filed at 05/04/2018 0700 Gross per 24 hour  Intake 1656.78 ml  Output 2925 ml  Net -1268.22 ml      Physical Exam    General:  Sitting up in bed. No resp difficulty HEENT: normal Neck: supple. JVP up  Carotids 2+ bilat; no bruits. No lymphadenopathy or thryomegaly appreciated. Cor: Sternal wound ok PMI nondisplaced. Regular rate & rhythm. No rubs, gallops or murmurs. +CTs Lungs: decreased at bases L>R Abdomen: soft, nontender, +BS  Extremities: no cyanosis, clubbing, rash, tr edema Neuro: alert & orientedx3, cranial nerves grossly intact. moves all 4 extremities w/o difficulty. Affect pleasant   Telemetry   NSR. Personally reviewed.   Labs    CBC Recent Labs    05/03/18 0433 05/04/18 0352  WBC 14.3* 12.0*  HGB 9.6* 9.1*  HCT 28.4* 26.5*  MCV 103.6* 103.5*  PLT 125* 102*   Basic Metabolic Panel Recent Labs    05/02/18 0353 05/02/18 1740  05/03/18 0433 05/03/18 1908 05/04/18 0352  NA 135  --    < > 132*  --  131*  K 4.6  --    < > 3.6 3.8 3.4*    CL 104  --    < > 100  --  96*  CO2 25  --   --  26  --  30  GLUCOSE 113*  --    < > 101*  --  113*  BUN 9  --    < > 11  --  9  CREATININE 0.60* 0.79   < > 0.67  --  0.73  CALCIUM 8.2*  --   --  7.8*  --  8.0*  MG 2.4 2.0  --   --   --   --    < > = values in this interval not displayed.   Liver Function Tests No results for input(s): AST, ALT, ALKPHOS, BILITOT, PROT, ALBUMIN in the last 72 hours. No results for input(s): LIPASE, AMYLASE in the last 72 hours. Cardiac Enzymes No results for input(s): CKTOTAL, CKMB, CKMBINDEX, TROPONINI in the last 72 hours.  BNP: BNP (last 3 results) Recent Labs    04/19/18 1233  BNP 1,549.5*    ProBNP (last 3 results) No results for input(s): PROBNP in the last 8760 hours.   D-Dimer No results for input(s): DDIMER in the last 72 hours. Hemoglobin A1C No results for input(s): HGBA1C in the last 72 hours. Fasting  Lipid Panel No results for input(s): CHOL, HDL, LDLCALC, TRIG, CHOLHDL, LDLDIRECT in the last 72 hours. Thyroid Function Tests No results for input(s): TSH, T4TOTAL, T3FREE, THYROIDAB in the last 72 hours.  Invalid input(s): FREET3  Other results:   Imaging    Dg Chest 2 View  Result Date: 05/04/2018 CLINICAL DATA:  Patient status post aortic root replacement 05/01/2018. EXAM: CHEST - 2 VIEW COMPARISON:  Single-view of the chest 05/03/2018 and 05/02/2018. FINDINGS: Right IJ sheath and mediastinal drain remain in place. The patient has small bilateral pleural effusions and mild basilar atelectasis. Lungs otherwise clear. No pneumothorax. Heart size is normal. Aortic atherosclerosis noted. IMPRESSION: Small bilateral pleural effusions and mild bibasilar atelectasis. Lungs otherwise clear. No pneumothorax. Electronically Signed   By: Inge Rise M.D.   On: 05/04/2018 08:28     Medications:     Scheduled Medications: . acetaminophen  1,000 mg Oral Q6H  . amiodarone  150 mg Intravenous Once  . amiodarone  400 mg Oral  BID PC  . aspirin EC  325 mg Oral Daily  . bisacodyl  10 mg Oral Daily   Or  . bisacodyl  10 mg Rectal Daily  . buPROPion  150 mg Oral Daily  . carvedilol  12.5 mg Oral BID WC  . Chlorhexidine Gluconate Cloth  6 each Topical Daily  . docusate sodium  200 mg Oral Daily  . DULoxetine  60 mg Oral Daily  . enoxaparin (LOVENOX) injection  40 mg Subcutaneous QHS  . [START ON 05/05/2018] furosemide  40 mg Oral BID  . mouth rinse  15 mL Mouth Rinse BID  . montelukast  10 mg Oral Daily  . pantoprazole  40 mg Oral Daily  . potassium chloride  20 mEq Oral BID  . QUEtiapine  25 mg Oral QHS  . sodium chloride flush  3 mL Intravenous Q12H  . sodium chloride flush  3 mL Intravenous Q12H  . tamsulosin  0.4 mg Oral QPC supper    Infusions: . sodium chloride    . sodium chloride Stopped (05/04/18 0509)  . sodium chloride    . amiodarone 30 mg/hr (05/04/18 0600)  . furosemide (LASIX) infusion 10 mg/hr (05/04/18 0855)  . lactated ringers    . phenylephrine (NEO-SYNEPHRINE) Adult infusion    . potassium chloride 10 mEq (05/04/18 0905)    PRN Medications: sodium chloride, sodium chloride, clonazePAM, hydrocortisone cream, metoprolol tartrate, morphine injection, ondansetron (ZOFRAN) IV, oxyCODONE, sodium chloride flush, sodium chloride flush, traMADol    Patient Profile   Mr Bhola is a 72 year old with a history of HTN, tobacco abuse, aortic insufficiency, and diastolic heart failure.   CT consulted for AI. CT requested HF team to optimize prior to surgery.   Assessment/Plan    1. Aortic Insufficiency - ECHO with AV severe regurgitation  - s/p AVR/bentall 8/30 POD#3.  - neo off now bp holding neg 3L last 24hr weight decreasing    2 . A/C Diastolic HF due to #1 - As above continue lasix gtt today - getting potassium runs  3. Smoker  - Encouraged cessation. No change.    4. Hypervolemic hyponatremia  - Sodium 131 today - Restrict free H20. Continue to diurese and follow  closely.   5. Emphysema - Noted on CTA. No wheezing good air movement.   6. Mild nonobstructive CAD - LHC 04/27/18: Prox LAD to Mid LAD lesion is 40% stenosed. - LDL 53 8/28.  - Will need statin and ASA on d/c  7. Post-op AF with hypotension - back in NSR on amio gtt. Switching to oral today.     Length of Stay: [place], MD  05/04/2018, 9:58 AM  Advanced Heart Failure Team Pager [phone] (M-F; 7a - 4p)  Please contact [facility] for night-coverage after hours (4p -7a ) and weekends on [email or web address]  Patient seen and examined with the above-signed Advanced Practice Provider and/or Housestaff. I personally reviewed laboratory data, imaging studies and relevant notes. I independently examined the patient and formulated the important aspects of the plan. I have edited the note to reflect any of my changes or salient points. I have personally discussed the plan with the patient and/or family.  Progressing well on POD #3. Maintaining NSR on amio. Switched to po today. Will follow. Diuresing well on lasix gtt would continue. Still about 15 pounds to go. BP stable off neo. Hgb ok. CTs out today. Continue to progress. D/w Dr. Roxy Manns.   Glori Bickers, MD  11:51 AM

## 2018-05-04 NOTE — Progress Notes (Signed)
Pt lost PIV access, difficult stick, will leave IJ in place for now.

## 2018-05-04 NOTE — Progress Notes (Signed)
[address]       [place] [address]             [phone]        CARDIOTHORACIC SURGERY PROGRESS NOTE   R3 Days Post-Op Procedure(s) (LRB): BIOLOGICAL BENTALL PROCEDURE AORTIC ROOT REPLACEMENT WITH 30MM VALSALVA GRAFT & 27MM INSPIRIS VALVE. (N/A) TRANSESOPHAGEAL ECHOCARDIOGRAM (TEE) (N/A)  Subjective: Looks good.  Had a good night.  Minimal pain.  No SOB  Objective: Vital signs: BP Readings from Last 1 Encounters:  05/04/18 111/77   Pulse Readings from Last 1 Encounters:  05/04/18 95   Resp Readings from Last 1 Encounters:  05/04/18 20   Temp Readings from Last 1 Encounters:  05/04/18 98.1 F (36.7 C) (Oral)    Hemodynamics:    Physical Exam:  Rhythm:   sinus  Breath sounds: clear  Heart sounds:  RRR  Incisions:  Dressing dry, intact  Abdomen:  Soft, non-distended, non-tender  Extremities:  Warm, well-perfused  Chest tubes:  low volume thin serosanguinous output, no air leak    Intake/Output from previous day: 09/01 0701 - 09/02 0700 In: 2337.4 [P.O.:720; I.V.:868.3; IV Piggyback:749.1] Out: 3095 [Urine:2865; Chest Tube:230] Intake/Output this shift: No intake/output data recorded.  Lab Results:  CBC: Recent Labs    05/03/18 0433 05/04/18 0352  WBC 14.3* 12.0*  HGB 9.6* 9.1*  HCT 28.4* 26.5*  PLT 125* 136*    BMET:  Recent Labs    05/03/18 0433 05/03/18 1908 05/04/18 0352  NA 132*  --  131*  K 3.6 3.8 3.4*  CL 100  --  96*  CO2 26  --  30  GLUCOSE 101*  --  113*  BUN 11  --  9  CREATININE 0.67  --  0.73  CALCIUM 7.8*  --  8.0*     PT/INR:   Recent Labs    05/01/18 1508  LABPROT 18.7*  INR 1.58    CBG (last 3)  Recent Labs    05/02/18 2340 05/03/18 0347 05/03/18 0726  GLUCAP 121* 100* 123*    ABG    Component Value Date/Time   PHART 7.327 (L) 05/01/2018 2236   PCO2ART 45.3 05/01/2018 2236   PO2ART 96.0 05/01/2018 2236   HCO3 23.8 05/01/2018 2236   TCO2 23 05/02/2018 1752   ACIDBASEDEF  2.0 05/01/2018 2236   O2SAT 97.0 05/01/2018 2236    CXR: CHEST - 2 VIEW  COMPARISON:  Single-view of the chest 05/03/2018 and 05/02/2018.  FINDINGS: Right IJ sheath and mediastinal drain remain in place. The patient has small bilateral pleural effusions and mild basilar atelectasis. Lungs otherwise clear. No pneumothorax. Heart size is normal. Aortic atherosclerosis noted.  IMPRESSION: Small bilateral pleural effusions and mild bibasilar atelectasis. Lungs otherwise clear. No pneumothorax.   Electronically Signed   By: Inge Rise M.D.   On: 05/04/2018 08:28   Assessment/Plan: S/P Procedure(s) (LRB): BIOLOGICAL BENTALL PROCEDURE AORTIC ROOT REPLACEMENT WITH 30MM VALSALVA GRAFT & 27MM INSPIRIS VALVE. (N/A) TRANSESOPHAGEAL ECHOCARDIOGRAM (TEE) (N/A)  Stable POD3 Post-op Afib, maintaining NSR on IV amiodarone Breathing comfortably w/ O2 sats 97% on room air, CXR clear Acutediastolic CHF with expected post-op volume excess, diuresing on lasix drip and weight down 3 kg but still >> preop Expected post op acute blood loss anemia,mild Expected post op atelectasis,very mild Hypokalemia, induced by loop diuretics   Convert amiodarone to oral  Continue lasix drip today  D/C tubes   Supplement potassium  Transfer step down  Rexene Alberts, MD 05/04/2018 8:46 AM

## 2018-05-05 ENCOUNTER — Encounter (HOSPITAL_COMMUNITY): Payer: Self-pay | Admitting: Thoracic Surgery (Cardiothoracic Vascular Surgery)

## 2018-05-05 LAB — BASIC METABOLIC PANEL
Anion gap: 8 (ref 5–15)
BUN: 11 mg/dL (ref 8–23)
CO2: 29 mmol/L (ref 22–32)
Calcium: 8.3 mg/dL — ABNORMAL LOW (ref 8.9–10.3)
Chloride: 92 mmol/L — ABNORMAL LOW (ref 98–111)
Creatinine, Ser: 0.79 mg/dL (ref 0.61–1.24)
GFR calc Af Amer: >60 mL/min (ref >60)
GFR calc non Af Amer: >60 mL/min (ref >60)
Glucose, Bld: 115 mg/dL — ABNORMAL HIGH (ref 70–99)
Potassium: 3.8 mmol/L (ref 3.5–5.1)
Sodium: 129 mmol/L — ABNORMAL LOW (ref 135–145)

## 2018-05-05 MED ORDER — POTASSIUM CHLORIDE CRYS ER 20 MEQ PO TBCR
40.0000 meq | EXTENDED_RELEASE_TABLET | Freq: Two times a day (BID) | ORAL | Status: DC
  Administered 2018-05-05 – 2018-05-07 (×6): 40 meq via ORAL
  Filled 2018-05-05 (×7): qty 2

## 2018-05-05 MED ORDER — FUROSEMIDE 10 MG/ML IJ SOLN
40.0000 mg | Freq: Two times a day (BID) | INTRAMUSCULAR | Status: DC
  Administered 2018-05-05: 40 mg via INTRAVENOUS
  Filled 2018-05-05: qty 4

## 2018-05-05 MED ORDER — CARVEDILOL 25 MG PO TABS
25.0000 mg | ORAL_TABLET | Freq: Two times a day (BID) | ORAL | Status: DC
  Administered 2018-05-05 – 2018-05-08 (×6): 25 mg via ORAL
  Filled 2018-05-05 (×7): qty 1

## 2018-05-05 MED ORDER — FUROSEMIDE 10 MG/ML IJ SOLN
80.0000 mg | Freq: Two times a day (BID) | INTRAMUSCULAR | Status: DC
  Administered 2018-05-05 – 2018-05-06 (×2): 80 mg via INTRAVENOUS
  Filled 2018-05-05 (×2): qty 8

## 2018-05-05 MED FILL — Magnesium Sulfate Inj 50%: INTRAMUSCULAR | Qty: 10 | Status: AC

## 2018-05-05 MED FILL — Potassium Chloride Inj 2 mEq/ML: INTRAVENOUS | Qty: 40 | Status: AC

## 2018-05-05 MED FILL — Heparin Sodium (Porcine) Inj 1000 Unit/ML: INTRAMUSCULAR | Qty: 30 | Status: AC

## 2018-05-05 MED FILL — Heparin Sodium (Porcine) Inj 1000 Unit/ML: INTRAMUSCULAR | Qty: 2500 | Status: AC

## 2018-05-05 NOTE — Care Management Important Message (Signed)
Important Message  Patient Details  Name: Travis Reese MRN: [id number] Date of Birth: 26-Apr-1946   Medicare Important Message Given:  Yes    [name] 05/05/2018, 4:43 PM

## 2018-05-05 NOTE — Anesthesia Postprocedure Evaluation (Signed)
Anesthesia Post Note  Patient: Travis Reese  Procedure(s) Performed: BIOLOGICAL BENTALL PROCEDURE AORTIC ROOT REPLACEMENT WITH 30MM VALSALVA GRAFT & 27MM INSPIRIS VALVE. (N/A Chest) TRANSESOPHAGEAL ECHOCARDIOGRAM (TEE) (N/A )     Patient location during evaluation: SICU Anesthesia Type: General Level of consciousness: sedated Pain management: pain level controlled Vital Signs Assessment: post-procedure vital signs reviewed and stable Respiratory status: patient remains intubated per anesthesia plan Cardiovascular status: stable Postop Assessment: no apparent nausea or vomiting Anesthetic complications: no    Last Vitals:  Vitals:   05/05/18 1243 05/05/18 1300  BP:  97/79  Pulse:    Resp:  (!) 21  Temp: 36.8 C   SpO2:      Last Pain:  Vitals:   05/05/18 1607  TempSrc:   PainSc: 0-No pain                 Kaileen Bronkema S

## 2018-05-05 NOTE — Discharge Summary (Addendum)
Physician Discharge Summary  Patient ID: TYREQUE FINKEN MRN: [id number] DOB/AGE: Nov 29, 1945 72 y.o.  Admit date: 04/19/2018 Discharge date: 05/08/2018  Admission Diagnoses:  Patient Active Problem List   Diagnosis Date Noted  . Severe aortic insufficiency 04/23/2018  . HTN (hypertension) 04/19/2018  . Acute CHF (congestive heart failure) ([place]) 04/19/2018  . BPH (benign prostatic hyperplasia) 04/19/2018  . Anxiety 04/19/2018  . Prolapsed internal hemorrhoids, grade 3 04/19/2015   Discharge Diagnoses:   Patient Active Problem List   Diagnosis Date Noted  . S/P aortic valve replacement with bioprosthetic valve 05/01/2018  . Severe aortic insufficiency 04/23/2018  . HTN (hypertension) 04/19/2018  . Acute CHF (congestive heart failure) ([place]) 04/19/2018  . BPH (benign prostatic hyperplasia) 04/19/2018  . Anxiety 04/19/2018  . Prolapsed internal hemorrhoids, grade 3 04/19/2015   Discharged Condition: good  History of Present Illness:  Mr. Rosengrant is a 72 year old male with history of hypertension and long-standing tobacco use with no other cardiac risk factors who has been referred for surgical consultation to discuss treatment options for management of severe symptomatic aortic insufficiency in the setting of acute diastolic congestive heart failure.  The patient denies any significant previous cardiac history.  He specifically denies ever having been told that he had a heart murmur in the past.  He denies any known history of rheumatic fever.  He reports a 6 to 65-month history of progressive symptoms which initially were described as worsening fatigue with lower extremity weakness.  He began to experience progressive exertional shortness of breath.  Symptoms became more acutely worse several weeks ago.  Ultimately the patient was hospitalized at [facility] from August 11 through April 14, 2018.  The patient states that at time of his hospital admission at [facility] he had  resting shortness of breath, orthopnea, and edema.  He was treated with diuretic therapy and improved.  Echocardiogram performed at that time revealed severe aortic insufficiency.  He was referred for cardiothoracic surgical consultation.  He was not referred to a cardiologist and he was never seen by a cardiologist during his hospitalization.  He states that he was discharged home on oral Lasix, and at the time of hospital discharge the patient states that he was doing fairly well..  Shortly after hospital discharge his symptoms of shortness of breath began to get worse.  He presented acutely to [facility] on April 19, 2018 with another acute exacerbation of congestive heart failure including resting shortness of breath, orthopnea, lower extremity edema, and pulmonary edema on admission chest x-ray.  Baseline BNP level was >1,500 with Troponin 0.02.  EKG revealed sinus rhythm without ischemic changes.  The patient was seen in consultation by Dr. Meda Coffee and has been treated with diuretic therapy under the care of Dr. Reymundo Poll and the hospitalist team.  His weight has progressively increased, up >5 lbs since admission.  Transthoracic echocardiogram was performed today and notable for the presence of severe aortic insufficiency.  Cardiothoracic surgical consultation was requested.  He was evaluated by Dr. Roxy Manns who felt surgical intervention would be indicated.  The risks and benefits of the procedure were explained to the patient and she was agreeable to proceed.   Hospital Course:    The patient remained chest pain free prior to proceeding with surgery.  He was treated by the heart failure team who optimized the patient medically prior to surgery.  He was taken to the operating room on  05/01/2018 and underwent Biological Bentall Aortic Root Replacement.  He tolerated the procedure without difficulty and was taken to the SICU in stable condition.  He was extubated the evening of surgery.  During  his stay in the SICU the patient was weaned off Dopamine and NTG drip as tolerated.  He was started on lasix drip for hypervolemia.  He developed Atrial Fibrillation with rapid ventricular response and was treated with Amiodarone.  He converted to NSR.  He was hypokalemic and supplemented accordingly.  He was felt medically stable for transfer to the telemetry unit on 05/04/2018.  He continues to make progress.  He remains slightly volume overloaded and continued to diurese with IV diuretics.  He was maintaining NSR and his pacing wires were removed without difficulty.    His medication regimen was optimized by the AHF team.  He is ambulating independently.  His incisions are healing without evidence of infection.  The patient was startled by dietary staff as they entered his room.  The patient fell and sustained some minor scrapes on his arms.  He denied hitting his head or chest.  She denied hip and back pain.  He is felt to be medically stable for discharge home today.         Consults: AHF  Significant Diagnostic Studies: angiography:   - Left ventricle: Systolic function was normal. The estimated   ejection fraction was in the range of 55% to 60%. Wall motion was   normal; there were no regional wall motion abnormalities. - Aortic valve: Right coronary, left coronary, and noncoronary cusp   prolapse. No evidence of vegetation. There was severe   regurgitation directed centrally in the LVOT. - Mitral valve: No evidence of vegetation. - Left atrium: No evidence of thrombus in the atrial cavity or   appendage. - Right atrium: No evidence of thrombus in the atrial cavity or   appendage. - Atrial septum: No defect or patent foramen ovale was identified. - Tricuspid valve: No evidence of vegetation. - Pulmonic valve: No evidence of vegetation.  Treatments: surgery:    Biological Bentall Aortic Root Replacement             Edwards Inspiris Resilia Stented Bovine Pericardial Tissue Valve (size  27 mm, ref # [id number], serial # [id number])             Gelweave Valsalva woven vascular graft (size 30 mm, ref # [id number], serial # [id number])             Reimplantation of left main and right coronary arteries  Discharge Exam: Blood pressure 115/87, pulse 90, temperature 98.5 F (36.9 C), temperature source Oral, resp. rate 12, height 6\' 2"  (1.88 m), weight 91.7 kg, SpO2 96 %.  General appearance: alert, cooperative and no distress Heart: regular rate and rhythm Lungs: clear to auscultation bilaterally Abdomen: soft, non-tender; bowel sounds normal; no masses,  no organomegaly Extremities: edema trace Wound: clean and dry  Disposition: Home  Discharge Medications:  The patient has been discharged on:   1.Beta Blocker:  Yes [ x  ]                              No   [   ]                              If No, reason:  2.Ace Inhibitor/ARB: Yes [   ]  No  [ x   ]                                     If No, reason: labile BP  3.Statin:   Yes [ x  ]                  No  [   ]                  If No, reason:   4.Ecasa:  Yes  [ x  ]                  No   [   ]                  If No, reason:     Discharge Instructions    Amb Referral to Cardiac Rehabilitation   Complete by:  As directed    Diagnosis:  Valve Replacement   Valve:  Aortic     Allergies as of 05/08/2018   No Known Allergies     Medication List    STOP taking these medications   lisinopril 30 MG tablet Commonly known as:  PRINIVIL,ZESTRIL     TAKE these medications   acetaminophen 500 MG tablet Commonly known as:  TYLENOL Take 500 mg by mouth 2 (two) times daily.   albuterol 108 (90 Base) MCG/ACT inhaler Commonly known as:  PROVENTIL HFA;VENTOLIN HFA Inhale 2 puffs into the lungs every 6 (six) hours as needed for wheezing or shortness of breath.   amiodarone 200 MG tablet Commonly known as:  PACERONE Take 1 tablet (200 mg total) by mouth 2 (two) times daily after a  meal. For 7 days, then decrease to 200 mg daily   aspirin 325 MG EC tablet Take 1 tablet (325 mg total) by mouth daily.   atorvastatin 20 MG tablet Commonly known as:  LIPITOR Take 1 tablet (20 mg total) by mouth daily at 6 PM.   Azelastine HCl 0.15 % Soln Place 2 sprays into both nostrils daily as needed (allergies).   BIOTIN PO Take 1 tablet by mouth daily.   buPROPion 150 MG 24 hr tablet Commonly known as:  WELLBUTRIN XL Take 150 mg daily by mouth.   CALCIUM PO Take 1 tablet by mouth daily.   carvedilol 25 MG tablet Commonly known as:  COREG Take 1 tablet (25 mg total) by mouth 2 (two) times daily with a meal. What changed:    medication strength  how much to take   clonazePAM 1 MG tablet Commonly known as:  KLONOPIN Take 1 mg by mouth 3 (three) times daily as needed for anxiety.   co-enzyme Q-10 30 MG capsule Take 30 mg by mouth 2 (two) times daily.   DULoxetine 60 MG capsule Commonly known as:  CYMBALTA Take 60 mg daily by mouth.   DUREZOL 0.05 % Emul Generic drug:  Difluprednate Place 1 drop into the right eye 4 (four) times daily.   furosemide 40 MG tablet Commonly known as:  LASIX Take 1 tablet (40 mg total) by mouth daily. For 2 days, then take as needed for 3 lb weight gain Start taking on:  05/09/2018   montelukast 10 MG tablet Commonly known as:  SINGULAIR Take 10 mg daily by mouth.   moxifloxacin 0.5 % ophthalmic solution Commonly known as:  VIGAMOX Place 1  drop into the right eye 4 (four) times daily.   omeprazole 20 MG capsule Commonly known as:  PRILOSEC Take 20 mg by mouth 2 (two) times daily before a meal.   oxyCODONE 5 MG immediate release tablet Commonly known as:  Oxy IR/ROXICODONE Take 1 tablet (5 mg total) by mouth every 4 (four) hours as needed for severe pain.   polyethylene glycol powder powder Commonly known as:  GLYCOLAX/MIRALAX Take 17 g by mouth as needed for mild constipation.   primidone 50 MG tablet Commonly known  as:  MYSOLINE TAKE 1 TABLET BY MOUTH AT  BEDTIME   QUEtiapine 50 MG tablet Commonly known as:  SEROQUEL Take 25 mg by mouth at bedtime.   sodium chloride 0.65 % Soln nasal spray Commonly known as:  OCEAN Place 1-2 sprays into both nostrils as needed for congestion.   spironolactone 25 MG tablet Commonly known as:  ALDACTONE Take 0.5 tablets (12.5 mg total) by mouth daily.   tamsulosin 0.4 MG Caps capsule Commonly known as:  FLOMAX Take 0.4 mg daily by mouth.   tiZANidine 4 MG tablet Commonly known as:  ZANAFLEX Take 4 mg by mouth at bedtime.   vitamin C 1000 MG tablet Take 1,000 mg by mouth daily.      Follow-up Information    Rexene Alberts, MD Follow up on 06/08/2018.   Specialty:  Cardiothoracic Surgery Why:  Appointment is at 1:30, please get CXR at 1:00 at [facility] located on first floor of our office building Contact information: [address] [place] [address] [phone]        [facility] Follow up on 05/15/2018.   Specialty:  Cardiology Why:  Heart Failure Followup-8:30-Park/Dropoff at ER lot (Enter under blue "Specialty Clinics" awning) or under [facility] on [address] ([employer], garage code: 1600, elevator 1st floor). Take all am meds, bring all med bottles. Contact information: [address] [id number] [place] [phone]          Signed: Ellwood Handler 05/08/2018, 9:41 AM

## 2018-05-05 NOTE — Assessment & Plan Note (Signed)
Edwards Inspiris Resilia Stented Bovine Pericardial Tissue Valve (size 27 mm, ref # [id number], serial # [id number])

## 2018-05-05 NOTE — Progress Notes (Addendum)
Advanced Heart Failure Rounding Note  PCP-Cardiologist: No primary care provider on file.   Subjective:     Underwent AVR/Bentall 8/30   Developed AF on 9/1 started on IV amio. Converted to NSR. Yesterday switched to po amio.   Lasix drip stopped and po lasix was started. Weight down 1 pound. Weight remains up 15-16 pounds.    Feeling much better. Complaining of cough. No orthopnea or PND. Chest still sore.      Objective:   Weight Range: 98 kg Body mass index is 27.75 kg/m.   Vital Signs:   Temp:  [98.1 F (36.7 C)-98.8 F (37.1 C)] 98.5 F (36.9 C) (09/03 0513) Pulse Rate:  [79-95] 82 (09/03 0513) Resp:  [15-24] 18 (09/03 0513) BP: (96-122)/(64-86) 122/86 (09/03 0513) SpO2:  [96 %-100 %] 98 % (09/03 0513) Weight:  [98 kg] 98 kg (09/03 0500) Last BM Date: 05/04/18  Weight change: Filed Weights   05/03/18 0600 05/04/18 0600 05/05/18 0500  Weight: 101.2 kg 98.5 kg 98 kg    Intake/Output:   Intake/Output Summary (Last 24 hours) at 05/05/2018 0756 Last data filed at 05/04/2018 2300 Gross per 24 hour  Intake 1282.74 ml  Output 2000 ml  Net -717.26 ml      Physical Exam    General:   No resp difficulty. In bed.  HEENT: normal Neck: supple. JVP 9-10 . Carotids 2+ bilat; no bruits. No lymphadenopathy or thryomegaly appreciated. RIJ  Cor: Sternal incision approximated. PMI nondisplaced. Regular rate & rhythm. No rubs, gallops or murmurs. Lungs: clear on room air.  Abdomen: soft, nontender, nondistended. No hepatosplenomegaly. No bruits or masses. Good bowel sounds. Extremities: no cyanosis, clubbing, rash, edema Neuro: alert & orientedx3, cranial nerves grossly intact. moves all 4 extremities w/o difficulty. Affect pleasant  Telemetry  NSR 80-90s personally reviewed.   Labs    CBC Recent Labs    05/03/18 0433 05/04/18 0352  WBC 14.3* 12.0*  HGB 9.6* 9.1*  HCT 28.4* 26.5*  MCV 103.6* 103.5*  PLT 125* 960*   Basic Metabolic Panel Recent Labs    05/02/18 1740  05/04/18 0352 05/05/18 0349  NA  --    < > 131* 129*  K  --    < > 3.4* 3.8  CL  --    < > 96* 92*  CO2  --    < > 30 29  GLUCOSE  --    < > 113* 115*  BUN  --    < > 9 11  CREATININE 0.79   < > 0.73 0.79  CALCIUM  --    < > 8.0* 8.3*  MG 2.0  --   --   --    < > = values in this interval not displayed.   Liver Function Tests No results for input(s): AST, ALT, ALKPHOS, BILITOT, PROT, ALBUMIN in the last 72 hours. No results for input(s): LIPASE, AMYLASE in the last 72 hours. Cardiac Enzymes No results for input(s): CKTOTAL, CKMB, CKMBINDEX, TROPONINI in the last 72 hours.  BNP: BNP (last 3 results) Recent Labs    04/19/18 1233  BNP 1,549.5*    ProBNP (last 3 results) No results for input(s): PROBNP in the last 8760 hours.   D-Dimer No results for input(s): DDIMER in the last 72 hours. Hemoglobin A1C No results for input(s): HGBA1C in the last 72 hours. Fasting Lipid Panel No results for input(s): CHOL, HDL, LDLCALC, TRIG, CHOLHDL, LDLDIRECT in the last 72 hours. Thyroid Function  Tests No results for input(s): TSH, T4TOTAL, T3FREE, THYROIDAB in the last 72 hours.  Invalid input(s): FREET3  Other results:   Imaging    No results found.   Medications:     Scheduled Medications: . acetaminophen  1,000 mg Oral Q6H  . amiodarone  150 mg Intravenous Once  . amiodarone  400 mg Oral BID PC  . aspirin EC  325 mg Oral Daily  . bisacodyl  10 mg Oral Daily   Or  . bisacodyl  10 mg Rectal Daily  . buPROPion  150 mg Oral Daily  . carvedilol  12.5 mg Oral BID WC  . Chlorhexidine Gluconate Cloth  6 each Topical Daily  . docusate sodium  200 mg Oral Daily  . DULoxetine  60 mg Oral Daily  . enoxaparin (LOVENOX) injection  40 mg Subcutaneous QHS  . furosemide  40 mg Oral BID  . mouth rinse  15 mL Mouth Rinse BID  . montelukast  10 mg Oral Daily  . pantoprazole  40 mg Oral Daily  . potassium chloride  20 mEq Oral BID  . QUEtiapine  25 mg Oral QHS    . sodium chloride flush  3 mL Intravenous Q12H  . sodium chloride flush  3 mL Intravenous Q12H  . tamsulosin  0.4 mg Oral QPC supper    Infusions: . sodium chloride    . sodium chloride Stopped (05/04/18 1503)  . sodium chloride    . lactated ringers    . phenylephrine (NEO-SYNEPHRINE) Adult infusion      PRN Medications: sodium chloride, sodium chloride, clonazePAM, hydrocortisone cream, metoprolol tartrate, morphine injection, ondansetron (ZOFRAN) IV, oxyCODONE, sodium chloride flush, sodium chloride flush, traMADol    Patient Profile   [name] is a 72 year old with a history of HTN, tobacco abuse, aortic insufficiency, and diastolic heart failure.   CT consulted for AI. CT requested HF team to optimize prior to surgery.   Assessment/Plan    1. Aortic Insufficiency - ECHO with AV severe regurgitation  - s/p AVR/bentall 8/30 POD#4.    2 . A/C Diastolic HF due to #1 -- Lasix drip stopped 9/2  - Still with mild volume overload. Stop po lasix. Start IV lasix 40 mg twice a day.  Increase K to 40 meq twice a day.   3. Smoker  - Encouraged cessation. No change.    4. Hypervolemic hyponatremia  - Sodium 129  - Restrict free H20. Continue to diurese and follow closely.   5. Emphysema - Noted on CTA. No wheezing good air movement.   6. Mild nonobstructive CAD - LHC 04/27/18: Prox LAD to Mid LAD lesion is 40% stenosed. - LDL 53 8/28.  - Will need statin -On asa   7. Post-op AF with hypotension - Maintaining NSR. Continue po amio 400 mg twice a day.    Length of Stay: [place], NP  05/05/2018, 7:56 AM  Advanced Heart Failure Team Pager [phone] (M-F; [place])  Please contact [facility] for night-coverage after hours (4p -7a ) and weekends on [email or web address]  Patient seen and examined with Darrick Grinder, NP. We discussed all aspects of the encounter. I agree with the assessment and plan as stated above.   Remains volume overloaded. Lasix drip has fallen  off MAR. Will resume IV lasix at 40 bid. Can increase as needed. Supp electrolytes as needed.   Glori Bickers, MD  9:06 AM

## 2018-05-05 NOTE — Progress Notes (Signed)
Nutrition Follow-up  DOCUMENTATION CODES:   Not applicable  INTERVENTION:    Ensure Enlive po BID, each supplement provides 350 kcal and 20 grams of protein  NUTRITION DIAGNOSIS:   Increased nutrient needs related to chronic illness(CHF) as evidenced by estimated needs, ongoing  GOAL:   Patient will meet greater than or equal to 90% of their needs, progressing  MONITOR:   PO intake, Supplement acceptance, Labs, Weight trends, Skin, I & O's  ASSESSMENT:   Travis Reese is a 72 y.o. male with medical history significant of CAD, hypertension, BPH who comes in with shortness of breath, dyspnea on exertion.   Pt s/p procedure 8/30: BENTALL PROCEDURE AORTIC ROOT REPLACEMENT   RD spoke with patient. He is resting. Pleasant. Reports his appetite is improving. Enjoyed breakfast. Average PO intake is approximately 75% per flowsheet records.  Says he hasn't been getting his Ensure since transferred to 4E.  RD brought patient chocolate Ensure Enlive.  Labs and medications reviewed. CBG's [id number].  Diet Order:  Diet Order            Diet Heart Room service appropriate? Yes; Fluid consistency: Thin  Diet effective now             EDUCATION NEEDS:   Education needs have been addressed  Skin:  Skin Assessment: Reviewed RN Assessment  Last BM:  9/2  Height:   Ht Readings from Last 1 Encounters:  05/01/18 6\' 2"  (1.88 m)   Weight:   Wt Readings from Last 1 Encounters:  05/05/18 98 kg   Ideal Body Weight:  83.6 kg  BMI:  Body mass index is 27.75 kg/m.  Estimated Nutritional Needs:   Kcal:  1900-2100  Protein:  90-105 grams  Fluid:  1.9-2.1 L  Arthur Holms, RD, LDN Pager #: [phone] After-Hours Pager #: [phone]

## 2018-05-05 NOTE — Progress Notes (Signed)
CARDIAC REHAB PHASE I   PRE:  Rate/Rhythm: 85 SR  BP:  Supine: 97/79  Sitting:   Standing:    SaO2: 96%RA  MODE:  Ambulation: 275 ft   POST:  Rate/Rhythm: 103 ST  BP:  Supine:   Sitting: 111/81  Standing:    SaO2: 96%RA 1345-1417 Pt walked 275 ft on RA with rolling walker and asst x 1. Needed a little help to stand from sitting position. Pt stopped several times due to SOB. Encouraged pursed lip breathing. Back to bed after walk.    Graylon Good, RN BSN  05/05/2018 2:14 PM

## 2018-05-05 NOTE — Progress Notes (Addendum)
      [address]       [place] [address]             [phone]      4 Days Post-Op Procedure(s) (LRB): BIOLOGICAL BENTALL PROCEDURE AORTIC ROOT REPLACEMENT WITH 30MM VALSALVA GRAFT & 27MM INSPIRIS VALVE. (N/A) TRANSESOPHAGEAL ECHOCARDIOGRAM (TEE) (N/A)   Subjective:  No new complaints.  States he feels pretty good.  He even notices he feels better breathing and overall since having the surgery.  + ambulation  + BM  Objective: Vital signs in last 24 hours: Temp:  [98.1 F (36.7 C)-98.8 F (37.1 C)] 98.5 F (36.9 C) (09/03 0513) Pulse Rate:  [79-90] 82 (09/03 0513) Cardiac Rhythm: Bundle branch block;Heart block (09/03 0717) Resp:  [15-24] 18 (09/03 0513) BP: (96-122)/(64-86) 122/86 (09/03 0513) SpO2:  [96 %-100 %] 98 % (09/03 0513) Weight:  [98 kg] 98 kg (09/03 0500)  Intake/Output from previous day: 09/02 0701 - 09/03 0700 In: 1282.7 [P.O.:840; I.V.:207.7; IV Piggyback:235] Out: 2000 [Urine:2000]  General appearance: alert, cooperative and no distress Heart: regular rate and rhythm Lungs: clear to auscultation bilaterally Abdomen: soft, non-tender; bowel sounds normal; no masses,  no organomegaly Extremities: edema 1+ pitting Wound: clean and dry  Lab Results: Recent Labs    05/03/18 0433 05/04/18 0352  WBC 14.3* 12.0*  HGB 9.6* 9.1*  HCT 28.4* 26.5*  PLT 125* 136*   BMET:  Recent Labs    05/04/18 0352 05/05/18 0349  NA 131* 129*  K 3.4* 3.8  CL 96* 92*  CO2 30 29  GLUCOSE 113* 115*  BUN 9 11  CREATININE 0.73 0.79  CALCIUM 8.0* 8.3*    PT/INR: No results for input(s): LABPROT, INR in the last 72 hours. ABG    Component Value Date/Time   PHART 7.327 (L) 05/01/2018 2236   HCO3 23.8 05/01/2018 2236   TCO2 23 05/02/2018 1752   ACIDBASEDEF 2.0 05/01/2018 2236   O2SAT 97.0 05/01/2018 2236   CBG (last 3)  Recent Labs    05/02/18 2340 05/03/18 0347 05/03/18 0726  GLUCAP 121* 100* 123*    Assessment/Plan: S/P  Procedure(s) (LRB): BIOLOGICAL BENTALL PROCEDURE AORTIC ROOT REPLACEMENT WITH 30MM VALSALVA GRAFT & 27MM INSPIRIS VALVE. (N/A) TRANSESOPHAGEAL ECHOCARDIOGRAM (TEE) (N/A)  1. CV- NSR, continue Amiodarone, Coreg.. Will d/c EPW today 2. Pulm- no acute issues, off oxygen, continue IS 3. Renal- creatinine stable, weight remains up about 10 lbs, IV lasix per AHF, supplement K 4. Expected post operative blood loss anemia, mild at 9.1 5. Thrombocytopenia, improving 6. Dispo- patient stable, IV diuretics per AHF, d/c EPW, continue current care, possibly ready for d/c in next 24-48 hours 4.    LOS: 16 days    Erin Barrett 05/05/2018  I have seen and examined the patient and agree with the assessment and plan as outlined.  Looks good.  Increase Coreg dose.  Mobilize.  Continue diuretics.  Rexene Alberts, MD 05/05/2018 9:05 AM

## 2018-05-05 NOTE — Discharge Instructions (Signed)

## 2018-05-05 NOTE — Progress Notes (Signed)
Removed epicardial wires per order. 3 intact.  Pt tolerated procedure well.  Pt instructed to remain on bedrest for one hour.  Frequent vitals will be taken and documented. Pt resting with call bell within reach. Removed right introducer and held pressure for 5 minutes.  Applied occlusive dressing and instructed pt to remain in bed for 30 minutes. Pt aware to call if any concerns arise. Payton Emerald, RN

## 2018-05-06 ENCOUNTER — Inpatient Hospital Stay (HOSPITAL_COMMUNITY): Payer: Medicare Other

## 2018-05-06 HISTORY — PX: AORTIC VALVE REPLACEMENT: SHX41

## 2018-05-06 LAB — BASIC METABOLIC PANEL
Anion gap: 9 (ref 5–15)
Anion gap: 11 (ref 5–15)
BUN: 12 mg/dL (ref 8–23)
BUN: 15 mg/dL (ref 8–23)
CO2: 28 mmol/L (ref 22–32)
CO2: 27 mmol/L (ref 22–32)
Calcium: 8.4 mg/dL — ABNORMAL LOW (ref 8.9–10.3)
Calcium: 8.5 mg/dL — ABNORMAL LOW (ref 8.9–10.3)
Chloride: 93 mmol/L — ABNORMAL LOW (ref 98–111)
Chloride: 93 mmol/L — ABNORMAL LOW (ref 98–111)
Creatinine, Ser: 0.74 mg/dL (ref 0.61–1.24)
Creatinine, Ser: 0.93 mg/dL (ref 0.61–1.24)
GFR calc Af Amer: >60 mL/min (ref >60)
GFR calc Af Amer: >60 mL/min (ref >60)
GFR calc non Af Amer: >60 mL/min (ref >60)
GFR calc non Af Amer: >60 mL/min (ref >60)
Glucose, Bld: 115 mg/dL — ABNORMAL HIGH (ref 70–99)
Glucose, Bld: 136 mg/dL — ABNORMAL HIGH (ref 70–99)
Potassium: 3.9 mmol/L (ref 3.5–5.1)
Potassium: 3.8 mmol/L (ref 3.5–5.1)
Sodium: 130 mmol/L — ABNORMAL LOW (ref 135–145)
Sodium: 131 mmol/L — ABNORMAL LOW (ref 135–145)

## 2018-05-06 LAB — CBC
HCT: 25 % — ABNORMAL LOW (ref 39.0–52.0)
Hemoglobin: 8.7 g/dL — ABNORMAL LOW (ref 13.0–17.0)
MCH: 35.4 pg — ABNORMAL HIGH (ref 26.0–34.0)
MCHC: 34.8 g/dL (ref 30.0–36.0)
MCV: 101.6 fL — ABNORMAL HIGH (ref 78.0–100.0)
Platelets: 215 10*3/uL (ref 150–400)
RBC: 2.46 MIL/uL — ABNORMAL LOW (ref 4.22–5.81)
RDW: 12.5 % (ref 11.5–15.5)
WBC: 11.2 10*3/uL — ABNORMAL HIGH (ref 4.0–10.5)

## 2018-05-06 LAB — MAGNESIUM: Magnesium: 1.5 mg/dL — ABNORMAL LOW (ref 1.7–2.4)

## 2018-05-06 MED ORDER — FUROSEMIDE 10 MG/ML IJ SOLN
8.0000 mg/h | INTRAVENOUS | Status: DC
  Administered 2018-05-06 – 2018-05-07 (×2): 8 mg/h via INTRAVENOUS
  Filled 2018-05-06 (×2): qty 21

## 2018-05-06 MED ORDER — MAGNESIUM SULFATE 4 GM/100ML IV SOLN
4.0000 g | Freq: Once | INTRAVENOUS | Status: AC
  Administered 2018-05-06: 4 g via INTRAVENOUS
  Filled 2018-05-06: qty 100

## 2018-05-06 MED ORDER — ATORVASTATIN CALCIUM 20 MG PO TABS
20.0000 mg | ORAL_TABLET | Freq: Every day | ORAL | Status: DC
  Administered 2018-05-06 – 2018-05-07 (×2): 20 mg via ORAL
  Filled 2018-05-06 (×2): qty 1

## 2018-05-06 MED ORDER — FE FUMARATE-B12-VIT C-FA-IFC PO CAPS
1.0000 | ORAL_CAPSULE | Freq: Three times a day (TID) | ORAL | Status: DC
  Administered 2018-05-06 – 2018-05-08 (×8): 1 via ORAL
  Filled 2018-05-06 (×7): qty 1

## 2018-05-06 MED FILL — Mannitol IV Soln 20%: INTRAVENOUS | Qty: 1000 | Status: AC

## 2018-05-06 MED FILL — Heparin Sodium (Porcine) Inj 1000 Unit/ML: INTRAMUSCULAR | Qty: 20 | Status: AC

## 2018-05-06 MED FILL — Calcium Chloride Inj 10%: INTRAVENOUS | Qty: 10 | Status: AC

## 2018-05-06 MED FILL — Sodium Bicarbonate IV Soln 8.4%: INTRAVENOUS | Qty: 50 | Status: AC

## 2018-05-06 MED FILL — Electrolyte-R (PH 7.4) Solution: INTRAVENOUS | Qty: 7000 | Status: AC

## 2018-05-06 MED FILL — Sodium Chloride IV Soln 0.9%: INTRAVENOUS | Qty: 3000 | Status: AC

## 2018-05-06 MED FILL — Lidocaine HCl(Cardiac) IV PF Soln Pref Syr 100 MG/5ML (2%): INTRAVENOUS | Qty: 25 | Status: AC

## 2018-05-06 NOTE — Progress Notes (Signed)
CARDIAC REHAB PHASE I   Offered to walk pt x2. Pt declining, stating he will walk "in a little bit". Will return as time allows to try and walk pt.   Rufina Falco, RN BSN 05/06/2018 11:22 AM

## 2018-05-06 NOTE — Progress Notes (Signed)
CARDIAC REHAB PHASE I   PRE:  Rate/Rhythm: 33 SR  BP:  Sitting: 103/75      SaO2: 97 RA  MODE:  Ambulation: 350 ft   POST:  Rate/Rhythm: 85 SR  BP:  Sitting: 114/87    SaO2: 95 RA   Pt ambulated 317ft in hallway standby assist with front wheel walker. Pt became tired towards end of walk, and tried to rush back to bed. Pt educated to stop and take a standing rest break. Pt demonstrated 1500 on IS. Pt strongly encouraged to walk twice more today.   [phone] Rufina Falco, RN BSN 05/06/2018 1:44 PM

## 2018-05-06 NOTE — Progress Notes (Addendum)
      [address]       [place] [address]             [phone]      5 Days Post-Op Procedure(s) (LRB): BIOLOGICAL BENTALL PROCEDURE AORTIC ROOT REPLACEMENT WITH 30MM VALSALVA GRAFT & 27MM INSPIRIS VALVE. (N/A) TRANSESOPHAGEAL ECHOCARDIOGRAM (TEE) (N/A)   Subjective:  Feels great.  Would like to shower.  + ambulation  + BM  Objective: Vital signs in last 24 hours: Temp:  [98.2 F (36.8 C)-98.6 F (37 C)] 98.6 F (37 C) (09/04 0605) Pulse Rate:  [76-90] 82 (09/04 0605) Cardiac Rhythm: Normal sinus rhythm;Bundle branch block (09/03 1900) Resp:  [18-25] 18 (09/04 0605) BP: (96-122)/(66-85) 98/68 (09/04 0605) SpO2:  [98 %-99 %] 98 % (09/04 0605) Weight:  [97.9 kg] 97.9 kg (09/04 0605)  Intake/Output from previous day: 09/03 0701 - 09/04 0700 In: 900 [P.O.:900] Out: 2950 [Urine:2950]  General appearance: alert, cooperative and no distress Heart: regular rate and rhythm Lungs: clear to auscultation bilaterally Abdomen: soft, non-tender; bowel sounds normal; no masses,  no organomegaly Extremities: edema minimal Wound: clean and dry  Lab Results: Recent Labs    05/04/18 0352 05/06/18 0409  WBC 12.0* 11.2*  HGB 9.1* 8.7*  HCT 26.5* 25.0*  PLT 136* 215   BMET:  Recent Labs    05/05/18 0349 05/06/18 0409  NA 129* 130*  K 3.8 3.9  CL 92* 93*  CO2 29 28  GLUCOSE 115* 115*  BUN 11 12  CREATININE 0.79 0.74  CALCIUM 8.3* 8.4*    PT/INR: No results for input(s): LABPROT, INR in the last 72 hours. ABG    Component Value Date/Time   PHART 7.327 (L) 05/01/2018 2236   HCO3 23.8 05/01/2018 2236   TCO2 23 05/02/2018 1752   ACIDBASEDEF 2.0 05/01/2018 2236   O2SAT 97.0 05/01/2018 2236   CBG (last 3)  No results for input(s): GLUCAP in the last 72 hours.  Assessment/Plan: S/P Procedure(s) (LRB): BIOLOGICAL BENTALL PROCEDURE AORTIC ROOT REPLACEMENT WITH 30MM VALSALVA GRAFT & 27MM INSPIRIS VALVE. (N/A) TRANSESOPHAGEAL ECHOCARDIOGRAM (TEE)  (N/A)  1. CV- NSR, BP stable- continue Amiodarone, Coreg 2. Pulm- no acute issues, continue IS 3. Renal- creatinine WNL, weight is trending down, mild edema on exam, can possibly transition to oral regimen of Lasix, will defer to AHF, continue potassium supplementation 4. Expected post operative blood loss anemia, mild Hgb at 8.7 5. Dispo- patient stable, maintaining NSR, ready for d/c once medications optimized from heart failure standpoint   LOS: 17 days    Erin Barrett 05/06/2018  I have seen and examined the patient and agree with the assessment and plan as outlined.  Doing very well.  Possibly ready for d/c soon when okay w/ AHF team, but weight still >> baseline.  Rexene Alberts, MD 05/06/2018 9:05 AM

## 2018-05-06 NOTE — Progress Notes (Addendum)
Advanced Heart Failure Rounding Note  PCP-Cardiologist: No primary care provider on file.   Subjective:     Underwent AVR/Bentall 8/30   Yesterday po lasix stopped and IV lasix resumed.   Feeling better. Slept well. Had large BM this am and some stool incontinence. Feels weak. Chest sore. Denies SOB.     Objective:   Weight Range: 97.9 kg Body mass index is 27.71 kg/m.   Vital Signs:   Temp:  [98.2 F (36.8 C)-98.6 F (37 C)] 98.6 F (37 C) (09/04 0605) Pulse Rate:  [76-90] 82 (09/04 0605) Resp:  [18-25] 18 (09/04 0605) BP: (96-122)/(66-85) 98/68 (09/04 0605) SpO2:  [98 %-99 %] 98 % (09/04 0605) Weight:  [97.9 kg] 97.9 kg (09/04 0605) Last BM Date: 05/04/18  Weight change: Filed Weights   05/04/18 0600 05/05/18 0500 05/06/18 0605  Weight: 98.5 kg 98 kg 97.9 kg    Intake/Output:   Intake/Output Summary (Last 24 hours) at 05/06/2018 0753 Last data filed at 05/06/2018 0600 Gross per 24 hour  Intake 900 ml  Output 2950 ml  Net -2050 ml      Physical Exam   General:  No resp difficulty HEENT: normal Neck: supple. JVP 7-8 . Carotids 2+ bilat; no bruits. No lymphadenopathy or thryomegaly appreciated. Cor: PMI nondisplaced. Regular rate & rhythm. No rubs, gallops or murmurs. Lungs: clear on room air.  Abdomen: soft, nontender, nondistended. No hepatosplenomegaly. No bruits or masses. Good bowel sounds. Extremities: no cyanosis, clubbing, rash, R and LLE trace edema.  Neuro: alert & orientedx3, cranial nerves grossly intact. moves all 4 extremities w/o difficulty. Affect pleasant   Telemetry  NSr 80-90s personally reviewed.     CBC Recent Labs    05/04/18 0352 05/06/18 0409  WBC 12.0* 11.2*  HGB 9.1* 8.7*  HCT 26.5* 25.0*  MCV 103.5* 101.6*  PLT 136* 782   Basic Metabolic Panel Recent Labs    05/05/18 0349 05/06/18 0409  NA 129* 130*  K 3.8 3.9  CL 92* 93*  CO2 29 28  GLUCOSE 115* 115*  BUN 11 12  CREATININE 0.79 0.74  CALCIUM 8.3* 8.4*    MG  --  1.5*   Liver Function Tests No results for input(s): AST, ALT, ALKPHOS, BILITOT, PROT, ALBUMIN in the last 72 hours. No results for input(s): LIPASE, AMYLASE in the last 72 hours. Cardiac Enzymes No results for input(s): CKTOTAL, CKMB, CKMBINDEX, TROPONINI in the last 72 hours.  BNP: BNP (last 3 results) Recent Labs    04/19/18 1233  BNP 1,549.5*    ProBNP (last 3 results) No results for input(s): PROBNP in the last 8760 hours.   D-Dimer No results for input(s): DDIMER in the last 72 hours. Hemoglobin A1C No results for input(s): HGBA1C in the last 72 hours. Fasting Lipid Panel No results for input(s): CHOL, HDL, LDLCALC, TRIG, CHOLHDL, LDLDIRECT in the last 72 hours. Thyroid Function Tests No results for input(s): TSH, T4TOTAL, T3FREE, THYROIDAB in the last 72 hours.  Invalid input(s): FREET3  Other results:   Imaging    No results found.   Medications:     Scheduled Medications: . acetaminophen  1,000 mg Oral Q6H  . amiodarone  400 mg Oral BID PC  . aspirin EC  325 mg Oral Daily  . bisacodyl  10 mg Oral Daily   Or  . bisacodyl  10 mg Rectal Daily  . buPROPion  150 mg Oral Daily  . carvedilol  25 mg Oral BID WC  .  Chlorhexidine Gluconate Cloth  6 each Topical Daily  . docusate sodium  200 mg Oral Daily  . DULoxetine  60 mg Oral Daily  . enoxaparin (LOVENOX) injection  40 mg Subcutaneous QHS  . furosemide  80 mg Intravenous BID  . mouth rinse  15 mL Mouth Rinse BID  . montelukast  10 mg Oral Daily  . pantoprazole  40 mg Oral Daily  . potassium chloride  40 mEq Oral BID  . QUEtiapine  25 mg Oral QHS  . sodium chloride flush  3 mL Intravenous Q12H  . sodium chloride flush  3 mL Intravenous Q12H  . tamsulosin  0.4 mg Oral QPC supper    Infusions: . sodium chloride    . sodium chloride Stopped (05/04/18 1503)  . sodium chloride    . lactated ringers      PRN Medications: sodium chloride, sodium chloride, clonazePAM, hydrocortisone  cream, metoprolol tartrate, ondansetron (ZOFRAN) IV, oxyCODONE, sodium chloride flush, sodium chloride flush, traMADol    Patient Profile   Travis Reese is a 72 year old with a history of HTN, tobacco abuse, aortic insufficiency, and diastolic heart failure.   CT consulted for AI. CT requested HF team to optimize prior to surgery.   Assessment/Plan    1. Aortic Insufficiency - ECHO with AV severe regurgitation  - s/p AVR/bentall 8/30 POD#4.    2 . A/C Diastolic HF due to #1 -- Lasix drip stopped 9/2  - Volume status slowly improving. Continue 80 mg IV lasix one more day.  - Continue K to 40 meq twice a day.   3. Smoker  - Encouraged cessation. No change.    4. Hypervolemic hyponatremia  - Sodium 129  - Restrict free H20. Continue to diurese and follow closely.   5. Emphysema - Noted on CTA. No wheezing good air movement.   6. Mild nonobstructive CAD - LHC 04/27/18: Prox LAD to Mid LAD lesion is 40% stenosed. - LDL 53 8/28.  - Add atorvastatin -On asa   7. Post-op AF with hypotension - Maintaining NSR. Continue po amio 400 mg twice a day.    Length of Stay: [place], NP  05/06/2018, 7:53 AM  Advanced Heart Failure Team Pager [phone] (M-F; 7a - 4p)  Please contact [facility] for night-coverage after hours (4p -7a ) and weekends on [email or web address]  Patient seen and examined with Darrick Grinder, NP. We discussed all aspects of the encounter. I agree with the assessment and plan as stated above.   Remains in NSR on po amio. Volume status remains elevated. Weight up about 15 pounds with distended abdomen. Not responding very well to bolus IV lasix. Will start back lasix gtt and see if u/o improves. Continue amio for post-op AF. Continue to mobilize.   Glori Bickers, MD  11:05 PM

## 2018-05-07 LAB — GLUCOSE, CAPILLARY: Glucose-Capillary: 151 mg/dL — ABNORMAL HIGH (ref 70–99)

## 2018-05-07 LAB — BASIC METABOLIC PANEL
Anion gap: 11 (ref 5–15)
BUN: 13 mg/dL (ref 8–23)
CO2: 28 mmol/L (ref 22–32)
Calcium: 8.6 mg/dL — ABNORMAL LOW (ref 8.9–10.3)
Chloride: 93 mmol/L — ABNORMAL LOW (ref 98–111)
Creatinine, Ser: 0.86 mg/dL (ref 0.61–1.24)
GFR calc Af Amer: >60 mL/min (ref >60)
GFR calc non Af Amer: >60 mL/min (ref >60)
Glucose, Bld: 108 mg/dL — ABNORMAL HIGH (ref 70–99)
Potassium: 3.6 mmol/L (ref 3.5–5.1)
Sodium: 132 mmol/L — ABNORMAL LOW (ref 135–145)

## 2018-05-07 LAB — MAGNESIUM: Magnesium: 1.7 mg/dL (ref 1.7–2.4)

## 2018-05-07 MED ORDER — POTASSIUM CHLORIDE CRYS ER 20 MEQ PO TBCR
40.0000 meq | EXTENDED_RELEASE_TABLET | Freq: Once | ORAL | Status: AC
  Administered 2018-05-07: 40 meq via ORAL
  Filled 2018-05-07: qty 2

## 2018-05-07 MED ORDER — SPIRONOLACTONE 12.5 MG HALF TABLET
12.5000 mg | ORAL_TABLET | Freq: Every day | ORAL | Status: DC
  Administered 2018-05-07 – 2018-05-08 (×2): 12.5 mg via ORAL
  Filled 2018-05-07 (×2): qty 1

## 2018-05-07 MED ORDER — AMIODARONE HCL 200 MG PO TABS
200.0000 mg | ORAL_TABLET | Freq: Two times a day (BID) | ORAL | Status: DC
  Administered 2018-05-07 – 2018-05-08 (×2): 200 mg via ORAL
  Filled 2018-05-07 (×2): qty 1

## 2018-05-07 MED ORDER — MAGNESIUM SULFATE 4 GM/100ML IV SOLN
4.0000 g | Freq: Once | INTRAVENOUS | Status: AC
  Administered 2018-05-07: 4 g via INTRAVENOUS
  Filled 2018-05-07: qty 100

## 2018-05-07 NOTE — Progress Notes (Addendum)
      [address]       [place] [address]             [phone]      6 Days Post-Op Procedure(s) (LRB): BIOLOGICAL BENTALL PROCEDURE AORTIC ROOT REPLACEMENT WITH 30MM VALSALVA GRAFT & 27MM INSPIRIS VALVE. (N/A) TRANSESOPHAGEAL ECHOCARDIOGRAM (TEE) (N/A)   Subjective:  Feels tired this morning.  He states he was up most of the night going to the bathroom due to the Lasix.  He had some loose stool yesterday and asks that stool softeners be stopped.  Objective: Vital signs in last 24 hours: Temp:  [97.7 F (36.5 C)-98.4 F (36.9 C)] 98.2 F (36.8 C) (09/05 0313) Pulse Rate:  [67-87] 80 (09/05 0313) Cardiac Rhythm: Normal sinus rhythm;Bundle branch block (09/04 2200) Resp:  [16-24] 24 (09/05 0313) BP: (94-122)/(68-89) 119/89 (09/05 0313) SpO2:  [96 %-100 %] 97 % (09/05 0313) Weight:  [95.4 kg] 95.4 kg (09/05 0313)  Intake/Output from previous day: 09/04 0701 - 09/05 0700 In: 740.7 [P.O.:480; I.V.:160.7; IV Piggyback:100] Out: 4150 [Urine:4150]  General appearance: alert, cooperative and no distress Heart: regular rate and rhythm Lungs: clear to auscultation bilaterally Abdomen: soft, non-tender; bowel sounds normal; no masses,  no organomegaly Extremities: edema trace Wound: clean and dry  Lab Results: Recent Labs    05/06/18 0409  WBC 11.2*  HGB 8.7*  HCT 25.0*  PLT 215   BMET:  Recent Labs    05/06/18 1531 05/07/18 0330  NA 131* 132*  K 3.8 3.6  CL 93* 93*  CO2 27 28  GLUCOSE 136* 108*  BUN 15 13  CREATININE 0.93 0.86  CALCIUM 8.5* 8.6*    PT/INR: No results for input(s): LABPROT, INR in the last 72 hours. ABG    Component Value Date/Time   PHART 7.327 (L) 05/01/2018 2236   HCO3 23.8 05/01/2018 2236   TCO2 23 05/02/2018 1752   ACIDBASEDEF 2.0 05/01/2018 2236   O2SAT 97.0 05/01/2018 2236   CBG (last 3)  No results for input(s): GLUCAP in the last 72 hours.  Assessment/Plan: S/P Procedure(s) (LRB): BIOLOGICAL BENTALL  PROCEDURE AORTIC ROOT REPLACEMENT WITH 30MM VALSALVA GRAFT & 27MM INSPIRIS VALVE. (N/A) TRANSESOPHAGEAL ECHOCARDIOGRAM (TEE) (N/A)  1. CV-NSR with 1st degree block, on Amiodarone, Coreg 2. Pulm- no acute issues, off oxygen, continue IS 3. Renal- U/O had decreased, Lasix gtt resumed yesterday, weight down 5 lbs, K is on low end of normal at 3.6, will increase supplementation, diuretics per AHF 4. GI- loose stools, will stop stool softeners 5. Dispo- patient stable, in NSR with 1st AV block, weight down 5 lbs yesterday with resumption of Lasix gtt, diuretics per AHF, likely ready for d/c once volume status optimized   LOS: 18 days    Erin Barrett 05/07/2018 Patient seen and examined, agree with above Good diuresis over past 24 hours, still 10 pounds above normal weight  Remo Lipps C. Roxan Hockey, MD [facility] [phone]

## 2018-05-07 NOTE — Progress Notes (Addendum)
Advanced Heart Failure Rounding Note  PCP-Cardiologist: No primary care provider on file.   Subjective:     Underwent AVR/Bentall 8/30   \Yesterday lasix drip was restarted. Weight down 5 pounds. Brisk diuresis noted.   Feeling much better. Denies SOB.     Objective:   Weight Range: 95.4 kg Body mass index is 27 kg/m.   Vital Signs:   Temp:  [97.7 F (36.5 C)-98.4 F (36.9 C)] 98.2 F (36.8 C) (09/05 0313) Pulse Rate:  [67-87] 83 (09/05 0809) Resp:  [16-24] 24 (09/05 0313) BP: (94-122)/(68-89) 114/83 (09/05 0809) SpO2:  [96 %-100 %] 97 % (09/05 0313) Weight:  [95.4 kg] 95.4 kg (09/05 0313) Last BM Date: 05/06/18  Weight change: Filed Weights   05/05/18 0500 05/06/18 0605 05/07/18 0313  Weight: 98 kg 97.9 kg 95.4 kg    Intake/Output:   Intake/Output Summary (Last 24 hours) at 05/07/2018 0829 Last data filed at 05/07/2018 0500 Gross per 24 hour  Intake 740.66 ml  Output 4150 ml  Net -3409.34 ml      Physical Exam   General:  No resp difficulty. In bed.  HEENT: normal Neck: supple. JVP 7-8 . Carotids 2+ bilat; no bruits. No lymphadenopathy or thryomegaly appreciated. Cor: PMI nondisplaced. Regular rate & rhythm. No rubs, gallops or murmurs. Sternal incision approximated.  Lungs: clear on room air.  Abdomen: soft, nontender, nondistended. No hepatosplenomegaly. No bruits or masses. Good bowel sounds. Extremities: no cyanosis, clubbing, rash, R and LLLE trace edema.  Neuro: alert & orientedx3, cranial nerves grossly intact. moves all 4 extremities w/o difficulty. Affect pleasant   Telemetry  NSR 80-90s personally reviewed.    CBC Recent Labs    05/06/18 0409  WBC 11.2*  HGB 8.7*  HCT 25.0*  MCV 101.6*  PLT 147   Basic Metabolic Panel Recent Labs    05/06/18 0409 05/06/18 1531 05/07/18 0330  NA 130* 131* 132*  K 3.9 3.8 3.6  CL 93* 93* 93*  CO2 28 27 28   GLUCOSE 115* 136* 108*  BUN 12 15 13   CREATININE 0.74 0.93 0.86  CALCIUM 8.4*  8.5* 8.6*  MG 1.5*  --  1.7   Liver Function Tests No results for input(s): AST, ALT, ALKPHOS, BILITOT, PROT, ALBUMIN in the last 72 hours. No results for input(s): LIPASE, AMYLASE in the last 72 hours. Cardiac Enzymes No results for input(s): CKTOTAL, CKMB, CKMBINDEX, TROPONINI in the last 72 hours.  BNP: BNP (last 3 results) Recent Labs    04/19/18 1233  BNP 1,549.5*    ProBNP (last 3 results) No results for input(s): PROBNP in the last 8760 hours.   D-Dimer No results for input(s): DDIMER in the last 72 hours. Hemoglobin A1C No results for input(s): HGBA1C in the last 72 hours. Fasting Lipid Panel No results for input(s): CHOL, HDL, LDLCALC, TRIG, CHOLHDL, LDLDIRECT in the last 72 hours. Thyroid Function Tests No results for input(s): TSH, T4TOTAL, T3FREE, THYROIDAB in the last 72 hours.  Invalid input(s): FREET3  Other results:   Imaging    Dg Chest 2 View  Result Date: 05/06/2018 CLINICAL DATA:  Status post aortic root replacement. EXAM: CHEST - 2 VIEW COMPARISON:  05/04/2018 and older studies. FINDINGS: Cardiac silhouette is mildly enlarged. Changes from cardiac surgery and aortic valve replacement are stable. There is no mediastinal widening. Small to moderate bilateral pleural effusions. Mild basilar atelectasis. Remainder of the lungs is clear. No pneumothorax. IMPRESSION: 1. Findings are similar to the most recent prior  exam. There are persistent small to moderate-sized pleural effusions with mild basilar atelectasis. No evidence of pulmonary edema or pneumonia. No mediastinal widening. No pneumothorax. Electronically Signed   By: Lajean Manes M.D.   On: 05/06/2018 11:21     Medications:     Scheduled Medications: . amiodarone  400 mg Oral BID PC  . aspirin EC  325 mg Oral Daily  . atorvastatin  20 mg Oral q1800  . buPROPion  150 mg Oral Daily  . carvedilol  25 mg Oral BID WC  . Chlorhexidine Gluconate Cloth  6 each Topical Daily  . DULoxetine  60 mg  Oral Daily  . enoxaparin (LOVENOX) injection  40 mg Subcutaneous QHS  . ferrous [id number] C-folic acid  1 capsule Oral TID PC  . mouth rinse  15 mL Mouth Rinse BID  . montelukast  10 mg Oral Daily  . pantoprazole  40 mg Oral Daily  . potassium chloride  40 mEq Oral BID  . QUEtiapine  25 mg Oral QHS  . sodium chloride flush  3 mL Intravenous Q12H  . tamsulosin  0.4 mg Oral QPC supper    Infusions: . sodium chloride    . sodium chloride Stopped (05/04/18 2210)  . sodium chloride Stopped (05/06/18 1421)  . furosemide (LASIX) infusion 8 mg/hr (05/07/18 0134)  . lactated ringers      PRN Medications: sodium chloride, sodium chloride, clonazePAM, hydrocortisone cream, metoprolol tartrate, ondansetron (ZOFRAN) IV, oxyCODONE, sodium chloride flush, traMADol    Patient Profile   Travis Reese is a 72 year old with a history of HTN, tobacco abuse, aortic insufficiency, and diastolic heart failure.   CT consulted for AI. CT requested HF team to optimize prior to surgery.   Assessment/Plan    1. Aortic Insufficiency - ECHO with AV severe regurgitation  - s/p AVR/bentall 8/30 POD#4.    2 . A/C Diastolic HF due to #1 -- Brisk diuresis with lasix drip. Weight down another 5 pounds. - Continue lasix drip one more day. Anticipate switching to torsemide 40 mg daily once diuresed.  -Renal function stable.  - Add 12.5 mg spiro daily.   3. Smoker  - Encouraged cessation. No change.    4. Hypervolemic hyponatremia  - sodium up to 129.  - Restrict free H20.  - BMET in am.   5. Emphysema - Noted on CTA. No wheezing good air movement.   6. Mild nonobstructive CAD - LHC 04/27/18: Prox LAD to Mid LAD lesion is 40% stenosed. - LDL 53 8/28.  - On atorvastatin and asa.   7. Post-op AF with hypotension - Maintaining NSR. Cut back amio to 200 mg twice a day.  -Hopefully we can stop soon.    Length of Stay: [place], NP  05/07/2018, 8:29 AM  Advanced Heart Failure  Team Pager [phone] (M-F; 7a - 4p)  Please contact [facility] for night-coverage after hours (4p -7a ) and weekends on   Patient seen and examined with Darrick Grinder, NP. We discussed all aspects of the encounter. I agree with the assessment and plan as stated above.   Brisk diuresis overnight on lasix gtt. Weight down 5 pounds. But still about 8 pounds over baseline. Creatinine stable. However patient had orthostatic symptoms on going to walk with PT. SBP remains 110-120. Will continue IV diuresis one more day cautiously. Remains in NSR. Agree with cutting amio back. Will continue to follow. Hopefully home soon.   Glori Bickers, MD  10:24 PM

## 2018-05-07 NOTE — Progress Notes (Signed)
CARDIAC REHAB PHASE I   PRE:  Rate/Rhythm: 79 SR  BP:  Sitting: 96/76  Standing: 87/76     Went to walk with pt, pt c/o dizziness upon rising. Noted decrease in BP. Pt helped to sit back in recliner. Discharge ed completed with pt. Educated on importance of daily showering and monitoring incisions.  Reinforced sternal precautions with pt. Encouraged continued IS use. Pt given heart healthy diet. Reviewed restrictions and exercise guidelines. Reinforced importance of mobility in recovering. Pt states he will be going home alone, but his brother is next door. His father, who he normally lives with, is currently in the hospital too. Pt encouraged to take care of himself, and call for help when needed for either him or his father after his return. Will refer to [facility] [place] and [place] at pts request. Encouraged pt to walk 3 times today. Will return as time allows.  [phone] Travis Falco, RN BSN 05/07/2018 10:25 AM

## 2018-05-08 LAB — BASIC METABOLIC PANEL
Anion gap: 13 (ref 5–15)
BUN: 12 mg/dL (ref 8–23)
CO2: 29 mmol/L (ref 22–32)
Calcium: 9.1 mg/dL (ref 8.9–10.3)
Chloride: 88 mmol/L — ABNORMAL LOW (ref 98–111)
Creatinine, Ser: 0.93 mg/dL (ref 0.61–1.24)
GFR calc Af Amer: >60 mL/min (ref >60)
GFR calc non Af Amer: >60 mL/min (ref >60)
Glucose, Bld: 111 mg/dL — ABNORMAL HIGH (ref 70–99)
Potassium: 4.1 mmol/L (ref 3.5–5.1)
Sodium: 130 mmol/L — ABNORMAL LOW (ref 135–145)

## 2018-05-08 LAB — MAGNESIUM: Magnesium: 1.9 mg/dL (ref 1.7–2.4)

## 2018-05-08 MED ORDER — AMIODARONE HCL 200 MG PO TABS: 200.0000 mg | ORAL_TABLET | Freq: Two times a day (BID) | ORAL | 2 refills | Status: DC

## 2018-05-08 MED ORDER — OXYCODONE HCL 5 MG PO TABS: 5.0000 mg | ORAL_TABLET | ORAL | 0 refills | Status: DC | PRN

## 2018-05-08 MED ORDER — SPIRONOLACTONE 25 MG PO TABS: 12.5000 mg | ORAL_TABLET | Freq: Every day | ORAL | 3 refills | Status: DC

## 2018-05-08 MED ORDER — CARVEDILOL 25 MG PO TABS: 25.0000 mg | ORAL_TABLET | Freq: Two times a day (BID) | ORAL | 3 refills | Status: DC

## 2018-05-08 MED ORDER — POTASSIUM CHLORIDE CRYS ER 20 MEQ PO TBCR
40.0000 meq | EXTENDED_RELEASE_TABLET | Freq: Every day | ORAL | Status: DC
  Administered 2018-05-08: 40 meq via ORAL
  Filled 2018-05-08: qty 2

## 2018-05-08 MED ORDER — FUROSEMIDE 40 MG PO TABS
40.0000 mg | ORAL_TABLET | Freq: Every day | ORAL | Status: DC
  Administered 2018-05-08: 40 mg via ORAL
  Filled 2018-05-08: qty 1

## 2018-05-08 MED ORDER — FUROSEMIDE 40 MG PO TABS: 40.0000 mg | ORAL_TABLET | Freq: Every day | ORAL | 3 refills | Status: DC

## 2018-05-08 MED ORDER — ASPIRIN 325 MG PO TBEC: 325.0000 mg | DELAYED_RELEASE_TABLET | Freq: Every day | ORAL | 0 refills | Status: DC

## 2018-05-08 MED ORDER — FUROSEMIDE 80 MG PO TABS: 80.0000 mg | ORAL_TABLET | Freq: Every day | ORAL | Status: DC

## 2018-05-08 MED ORDER — ATORVASTATIN CALCIUM 20 MG PO TABS: 20.0000 mg | ORAL_TABLET | Freq: Every day | ORAL | 3 refills | Status: DC

## 2018-05-08 NOTE — Progress Notes (Signed)
Pt discharged home per order. IV and telemetry box removed. Pt received discharge instructions and all questions were answered. Pt left with all of his belongings.  Ara Kussmaul BSN, RN

## 2018-05-08 NOTE — Care Management Note (Signed)
Case Management Note Marvetta Gibbons RN, BSN Unit 4E- RN Care Coordinator  [phone]  Patient Details  Name: Travis Reese MRN: [id number] Date of Birth: 12/03/1945  Subjective/Objective:    Pt admitted with severe AS, s/p AVR with post op vol. overload                Action/Plan: PTA pt lived at home with father, plan to return home, pt has insurance with drug plan. No CM needs noted for transition home.   Expected Discharge Date:  05/08/18               Expected Discharge Plan:  Home/Self Care  In-House Referral:  NA  Discharge planning Services  CM Consult  Post Acute Care Choice:  NA Choice offered to:  NA  DME Arranged:    DME Agency:     HH Arranged:    HH Agency:     Status of Service:  Completed, signed off  If discussed at [place] of Stay Meetings, dates discussed:    Discharge Disposition: home/self care   Additional Comments:  Dawayne Patricia, RN 05/08/2018, 11:35 AM

## 2018-05-08 NOTE — Progress Notes (Signed)
      [address]       [place] [address]             [phone]      7 Days Post-Op Procedure(s) (LRB): BIOLOGICAL BENTALL PROCEDURE AORTIC ROOT REPLACEMENT WITH 30MM VALSALVA GRAFT & 27MM INSPIRIS VALVE. (N/A) TRANSESOPHAGEAL ECHOCARDIOGRAM (TEE) (N/A)   Subjective:  No new complaints.  Some orthostasis with ambulating yesterday, but the patient felt his dizziness improved throughout the day.  Objective: Vital signs in last 24 hours: Temp:  [97.7 F (36.5 C)-98.7 F (37.1 C)] 98.5 F (36.9 C) (09/06 0403) Pulse Rate:  [75-88] 82 (09/06 0700) Cardiac Rhythm: Bundle branch block;Heart block (09/05 2300) Resp:  [12-24] 20 (09/06 0700) BP: (81-120)/(49-91) 105/66 (09/06 0700) SpO2:  [90 %-99 %] 94 % (09/06 0700) Weight:  [91.7 kg] 91.7 kg (09/06 0403)  Intake/Output from previous day: 09/05 0701 - 09/06 0700 In: 1163 [P.O.:960; I.V.:199.8; IV Piggyback:3.1] Out: 4175 [Urine:4175] Intake/Output this shift: Total I/O In: -  Out: 350 [Urine:350]  General appearance: alert, cooperative and no distress Heart: regular rate and rhythm Lungs: clear to auscultation bilaterally Abdomen: soft, non-tender; bowel sounds normal; no masses,  no organomegaly Extremities: edema trace Wound: clean and dry  Lab Results: Recent Labs    05/06/18 0409  WBC 11.2*  HGB 8.7*  HCT 25.0*  PLT 215   BMET:  Recent Labs    05/07/18 0330 05/08/18 0347  NA 132* 130*  K 3.6 4.1  CL 93* 88*  CO2 28 29  GLUCOSE 108* 111*  BUN 13 12  CREATININE 0.86 0.93  CALCIUM 8.6* 9.1    PT/INR: No results for input(s): LABPROT, INR in the last 72 hours. ABG    Component Value Date/Time   PHART 7.327 (L) 05/01/2018 2236   HCO3 23.8 05/01/2018 2236   TCO2 23 05/02/2018 1752   ACIDBASEDEF 2.0 05/01/2018 2236   O2SAT 97.0 05/01/2018 2236   CBG (last 3)  Recent Labs    05/07/18 1308  GLUCAP 151*    Assessment/Plan: S/P Procedure(s) (LRB): BIOLOGICAL BENTALL  PROCEDURE AORTIC ROOT REPLACEMENT WITH 30MM VALSALVA GRAFT & 27MM INSPIRIS VALVE. (N/A) TRANSESOPHAGEAL ECHOCARDIOGRAM (TEE) (N/A)  1. CV- NSR- Amiodarone has been decreased, continue Coreg 2. Pulm- no acute issues, off oxygen, continue IS 3. Renal- creatinine WNL, Lasix gtt continued yesterday, weight down another 8 lbs, likely stop gtt and transition to oral diuretics today, will defer to AHF team, K improved at 4.1 4. Gi- loose stools, improved, continue to hold stool softeners 5. Dispo- patient stable, weight improved after Lasix gtt, diuretics per AHF, mild orthostasis yesterday, patient states symptoms improved over the course of the day, continue current care, hopefully home in AM if remains stable today and medications optimized with AHF   LOS: 19 days    Travis Reese 05/08/2018

## 2018-05-08 NOTE — Progress Notes (Signed)
Pt ambulating with standby assist to bathroom. Someone entered the room and startled the pt. Pt turned around quickly and fell on his right hip/ butt. Pt assisted back to bed. Pt denies pain. Vitals obtained. Skin tears noted to left forearm and right elbow. Gauze dressings applied. PA notified and at bedside to assess pt. Informed to continue to monitor pt and if stable, can discharge this afternoon. Will continue to monitor pt.   Ara Kussmaul BSN, RN

## 2018-05-08 NOTE — Progress Notes (Addendum)
Advanced Heart Failure Rounding Note  PCP-Cardiologist: No primary care provider on file.   Subjective:     Underwent AVR/Bentall 8/30   Yesterday diuresed with lasix drip. Weight down another 8 pounds.   No further dizziness.  Denies SOB. Creatinine stable    Objective:   Weight Range: 91.7 kg Body mass index is 25.95 kg/m.   Vital Signs:   Temp:  [97.7 F (36.5 C)-98.7 F (37.1 C)] 98.5 F (36.9 C) (09/06 0403) Pulse Rate:  [75-88] 82 (09/06 0700) Resp:  [12-24] 20 (09/06 0700) BP: (81-120)/(49-91) 105/66 (09/06 0700) SpO2:  [90 %-99 %] 94 % (09/06 0700) Weight:  [91.7 kg] 91.7 kg (09/06 0403) Last BM Date: 05/07/18  Weight change: Filed Weights   05/06/18 0605 05/07/18 0313 05/08/18 0403  Weight: 97.9 kg 95.4 kg 91.7 kg    Intake/Output:   Intake/Output Summary (Last 24 hours) at 05/08/2018 0755 Last data filed at 05/08/2018 0736 Gross per 24 hour  Intake 1162.95 ml  Output 4525 ml  Net -3362.05 ml      Physical Exam  General:  No resp difficulty. In bed.  HEENT: normal except R side of neck ecchymotic.  Neck: supple. JVP 6-7 . Carotids 2+ bilat; no bruits. No lymphadenopathy or thryomegaly appreciated.  Sternal incision approximated.  Cor: PMI nondisplaced. Regular rate & rhythm. No rubs, gallops or murmurs. Lungs: clear no wheeze Abdomen: soft, nontender, nondistended. No hepatosplenomegaly. No bruits or masses. Good bowel sounds. Extremities: no cyanosis, clubbing, rash, edema Neuro: alert & oriented x 3, cranial nerves grossly intact. moves all 4 extremities w/o difficulty. Affect pleasant   Telemetry  NSR 80-90s personally reviewed.    CBC Recent Labs    05/06/18 0409  WBC 11.2*  HGB 8.7*  HCT 25.0*  MCV 101.6*  PLT 132   Basic Metabolic Panel Recent Labs    05/07/18 0330 05/08/18 0347  NA 132* 130*  K 3.6 4.1  CL 93* 88*  CO2 28 29  GLUCOSE 108* 111*  BUN 13 12  CREATININE 0.86 0.93  CALCIUM 8.6* 9.1  MG 1.7 1.9    Liver Function Tests No results for input(s): AST, ALT, ALKPHOS, BILITOT, PROT, ALBUMIN in the last 72 hours. No results for input(s): LIPASE, AMYLASE in the last 72 hours. Cardiac Enzymes No results for input(s): CKTOTAL, CKMB, CKMBINDEX, TROPONINI in the last 72 hours.  BNP: BNP (last 3 results) Recent Labs    04/19/18 1233  BNP 1,549.5*    ProBNP (last 3 results) No results for input(s): PROBNP in the last 8760 hours.   D-Dimer No results for input(s): DDIMER in the last 72 hours. Hemoglobin A1C No results for input(s): HGBA1C in the last 72 hours. Fasting Lipid Panel No results for input(s): CHOL, HDL, LDLCALC, TRIG, CHOLHDL, LDLDIRECT in the last 72 hours. Thyroid Function Tests No results for input(s): TSH, T4TOTAL, T3FREE, THYROIDAB in the last 72 hours.  Invalid input(s): FREET3  Other results:   Imaging    No results found.   Medications:     Scheduled Medications: . amiodarone  200 mg Oral BID PC  . aspirin EC  325 mg Oral Daily  . atorvastatin  20 mg Oral q1800  . buPROPion  150 mg Oral Daily  . carvedilol  25 mg Oral BID WC  . Chlorhexidine Gluconate Cloth  6 each Topical Daily  . DULoxetine  60 mg Oral Daily  . enoxaparin (LOVENOX) injection  40 mg Subcutaneous QHS  . ferrous fumarate-b12-vitamic  C-folic acid  1 capsule Oral TID PC  . mouth rinse  15 mL Mouth Rinse BID  . montelukast  10 mg Oral Daily  . pantoprazole  40 mg Oral Daily  . potassium chloride  40 mEq Oral Daily  . QUEtiapine  25 mg Oral QHS  . sodium chloride flush  3 mL Intravenous Q12H  . spironolactone  12.5 mg Oral Daily  . tamsulosin  0.4 mg Oral QPC supper    Infusions: . sodium chloride 250 mL (05/07/18 1446)  . sodium chloride Stopped (05/04/18 2210)  . sodium chloride Stopped (05/06/18 1421)  . furosemide (LASIX) infusion 8 mg/hr (05/08/18 0223)  . lactated ringers      PRN Medications: sodium chloride, sodium chloride, clonazePAM, hydrocortisone cream,  metoprolol tartrate, ondansetron (ZOFRAN) IV, oxyCODONE, sodium chloride flush, traMADol    Patient Profile   Travis Reese is a 72 year old with a history of HTN, tobacco abuse, aortic insufficiency, and diastolic heart failure.   CT consulted for AI. CT requested HF team to optimize prior to surgery.   Assessment/Plan    1. Aortic Insufficiency - ECHO with AV severe regurgitation  - s/p AVR/bentall 8/30 POD#4.    2 . A/C Diastolic HF due to #1 -- Stop lasix drip. Weight down another 8 pounds. Back down to his baseline.  - Considered torsemide but not that he is euvolemic will use lasix 40 mg lasix daily for the next 3 days. Then would use lasix as needed.  - Continue 12.5 mg spiro daily. Would not increase with soft bp.  - Renal function stable.   3. Smoker  - Encouraged cessation. No change.    4. Hypervolemic hyponatremia  - sodium 130  - Restrict free H20.  - BMET in am.   5. Emphysema - Noted on CTA. No wheezing good air movement.   6. Mild nonobstructive CAD - LHC 04/27/18: Prox LAD to Mid LAD lesion is 40% stenosed. - LDL 53 8/28.  - On atorvastatin and asa.   7. Post-op AF with hypotension - Maintaining NSR. Continue amio to 200 mg twice a day.  - Will stop soon.   Home HF meds  Amio 200 mg twice a day x7 days then 200 mg daily   Spiro 12.5 mg daily Lasix 40 mg 9/7 and 9/8 then as needed for 3 pound weight gain.  Carvedilol 25 mg twice a day.  Atorvastatin 20 mg daily   We will set up for HF follow up. Should be able to d/c    Length of Stay: [employer], NP  05/08/2018, 7:55 AM  Advanced Heart Failure Team Pager [phone] (M-F; 7a - 4p)  Please contact [facility] for night-coverage after hours (4p -7a ) and weekends  Patient seen and examined with Darrick Grinder, NP. We discussed all aspects of the encounter. I agree with the assessment and plan as stated above.   He has diuresed well. Back to baseline. Rhythm stable. Can go home today from  our standpoint. Would use lasix as above. F/u scheduled in HF Clinic.   Glori Bickers, MD  9:18 AM

## 2018-05-08 NOTE — Progress Notes (Signed)
Pt ambulated 150 ft in hallway with walker. Pt tolerated well. Returned to chair with call light within reach.   Ara Kussmaul BSN, RN

## 2018-05-08 NOTE — Progress Notes (Signed)
CARDIAC REHAB PHASE I   Offered to walk with pt, pt declining stating he is going home. Reinforced sternal precautions, IS use and ambulation. Pt referred to CRP II [place] and [place] at pts request.  [id number] Rufina Falco, RN BSN 05/08/2018 10:01 AM

## 2018-05-08 NOTE — Progress Notes (Signed)
Chest tube sutures removed, per order. Sites clean and dry. Benzoin and steri strips applied. 

## 2018-05-14 ENCOUNTER — Telehealth (HOSPITAL_COMMUNITY): Payer: Self-pay

## 2018-05-14 NOTE — Telephone Encounter (Signed)
Pt insurance is active and benefits verified through [facility] [employer] [facility] Co-pay $0.00, DED $0.00/$0.00 met, out of pocket $6,700.00/$0.00 met, co-insurance 0%. No pre-authorization. Rachel/[employer], 05/14/18 @ 12:14PM, JQB#[id number]  Will contact patient to see if he is interested in the Cardiac Rehab Program. If interested, patient will need to complete follow up appt. Once completed, patient will be contacted for scheduling upon review by the RN Navigator.

## 2018-05-14 NOTE — Telephone Encounter (Signed)
Attempted to call patient in regards to Cardiac Rehab - LM on VM 

## 2018-05-15 ENCOUNTER — Ambulatory Visit (HOSPITAL_COMMUNITY)
Admit: 2018-05-15 | Discharge: 2018-05-15 | Disposition: A | Discharge status: Home / Self Care | Payer: Medicare Other | Source: Ambulatory Visit | Attending: Internal Medicine | Admitting: Internal Medicine

## 2018-05-15 ENCOUNTER — Encounter (HOSPITAL_COMMUNITY): Payer: Self-pay

## 2018-05-15 VITALS — BP 110/72 | HR 72 | Wt 204.0 lb

## 2018-05-15 DIAGNOSIS — N400 Enlarged prostate without lower urinary tract symptoms: Secondary | ICD-10-CM | POA: Diagnosis not present

## 2018-05-15 DIAGNOSIS — Z79899 Other long term (current) drug therapy: Secondary | ICD-10-CM | POA: Diagnosis not present

## 2018-05-15 DIAGNOSIS — Z952 Presence of prosthetic heart valve: Secondary | ICD-10-CM

## 2018-05-15 DIAGNOSIS — I5022 Chronic systolic (congestive) heart failure: Secondary | ICD-10-CM

## 2018-05-15 DIAGNOSIS — I110 Hypertensive heart disease with heart failure: Secondary | ICD-10-CM | POA: Insufficient documentation

## 2018-05-15 DIAGNOSIS — J439 Emphysema, unspecified: Secondary | ICD-10-CM | POA: Diagnosis not present

## 2018-05-15 DIAGNOSIS — Z8371 Family history of colonic polyps: Secondary | ICD-10-CM | POA: Diagnosis not present

## 2018-05-15 DIAGNOSIS — Z820 Family history of epilepsy and other diseases of the nervous system: Secondary | ICD-10-CM | POA: Diagnosis not present

## 2018-05-15 DIAGNOSIS — I2510 Atherosclerotic heart disease of native coronary artery without angina pectoris: Secondary | ICD-10-CM | POA: Insufficient documentation

## 2018-05-15 DIAGNOSIS — Z803 Family history of malignant neoplasm of breast: Secondary | ICD-10-CM | POA: Diagnosis not present

## 2018-05-15 DIAGNOSIS — Z8042 Family history of malignant neoplasm of prostate: Secondary | ICD-10-CM | POA: Diagnosis not present

## 2018-05-15 DIAGNOSIS — Z7982 Long term (current) use of aspirin: Secondary | ICD-10-CM | POA: Diagnosis not present

## 2018-05-15 DIAGNOSIS — Z825 Family history of asthma and other chronic lower respiratory diseases: Secondary | ICD-10-CM | POA: Diagnosis not present

## 2018-05-15 DIAGNOSIS — I480 Paroxysmal atrial fibrillation: Secondary | ICD-10-CM

## 2018-05-15 DIAGNOSIS — M1990 Unspecified osteoarthritis, unspecified site: Secondary | ICD-10-CM | POA: Diagnosis not present

## 2018-05-15 DIAGNOSIS — F17210 Nicotine dependence, cigarettes, uncomplicated: Secondary | ICD-10-CM | POA: Insufficient documentation

## 2018-05-15 DIAGNOSIS — I351 Nonrheumatic aortic (valve) insufficiency: Secondary | ICD-10-CM | POA: Insufficient documentation

## 2018-05-15 DIAGNOSIS — F419 Anxiety disorder, unspecified: Secondary | ICD-10-CM | POA: Diagnosis not present

## 2018-05-15 DIAGNOSIS — I5032 Chronic diastolic (congestive) heart failure: Secondary | ICD-10-CM

## 2018-05-15 DIAGNOSIS — I5030 Unspecified diastolic (congestive) heart failure: Secondary | ICD-10-CM | POA: Diagnosis present

## 2018-05-15 DIAGNOSIS — Z8249 Family history of ischemic heart disease and other diseases of the circulatory system: Secondary | ICD-10-CM | POA: Insufficient documentation

## 2018-05-15 LAB — BASIC METABOLIC PANEL
Anion gap: 14 (ref 5–15)
BUN: 12 mg/dL (ref 8–23)
CO2: 24 mmol/L (ref 22–32)
Calcium: 8.9 mg/dL (ref 8.9–10.3)
Chloride: 94 mmol/L — ABNORMAL LOW (ref 98–111)
Creatinine, Ser: 0.92 mg/dL (ref 0.61–1.24)
GFR calc Af Amer: >60 mL/min (ref >60)
GFR calc non Af Amer: >60 mL/min (ref >60)
Glucose, Bld: 104 mg/dL — ABNORMAL HIGH (ref 70–99)
Potassium: 3.8 mmol/L (ref 3.5–5.1)
Sodium: 132 mmol/L — ABNORMAL LOW (ref 135–145)

## 2018-05-15 MED ORDER — AMIODARONE HCL 200 MG PO TABS: 200.0000 mg | ORAL_TABLET | Freq: Every day | ORAL | 11 refills | Status: DC

## 2018-05-15 NOTE — Progress Notes (Signed)
PCP: Hulda Marin PA-C  Primary Cardiologist: none  HF MD: Dr Haroldine Laws  CT Surgeon: Dr Roxy Manns   HPI: Travis Reese is a 72 year old with a history of HTN, tobacco abuse, aortic insufficiency, and diastolic heart failure.   Admitted to [facility] 04/12/18 with increased dyspnea. Stress test was negative. ECHO per patient showed leaky valve. He was diuresed with IV lasix and transitioned to oral lasix.   Admitted to [facility] on August 18th with increased SOB. CXR showed pulmonary edema. ECHO was completed and showed severe AI. He has been diuresing with IV lasix. Dr Roxy Manns was consulted with recommendations for TEE, RHC/LHC. HF team consulted to optimize prior to surgery.  s/p AVR/bentall 8/30. Diuresed with IV lasix then transitioned to lasix 40 mg as needed. Discharged on 05/08/2018 . Discharge weight 202 pounds.   Today he returns for post hospital follow up. Overall feeling fine. Says he is getter stronger every day.  Denies SOB/PND/Orthopnea. Appetite ok. No fever or chills. Denies chest pain. No bleeding problems.  Weight at home 203-204 pounds. He has not smoked since discharge. Taking all medications.  RHC/LHC 04/27/2018   Prox LAD to Mid LAD lesion is 40% stenosed.  Findings: Ao = 104/49 (74)  LV = 94/24 RA =  3 RV = 37/6 PA = 38/14 (25) PCW = 17 Fick cardiac output/index = 5.5/2.6 PVR = 1.5 WU Ao sat = 99% PA sat = 69%, 73% Assessment: 1. Mild nonobstructive CAD with 40% calcified mLAD lesion. 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 Normal LVEF 3. Severe AI by echo due to [place] prolapse 4. Essentially normal hemodynamics  CTA 8/22  No evidence of dissection involving the thoracoabdominal aorta. Small bilateral pleural effusions with bibasilar atelectasis slightly improved. Subtle peripheral patchy density over the lateral right upper lobe and anteromedial right middle lobe likely atelectasis, although infection is possible. Aspirate material over the distal trachea and right  mainstem bronchus. No acute findings in the abdomen/pelvis. 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 Recommend annual imaging followup by CTA or MRA. This recommendation follows 2010 ACCF/AHA/AATS/ACR/ASA/SCA/SCAI/SIR/STS/SVM Guidelines for the Diagnosis and Management of Patients with Thoracic Aortic Disease. Circulation. 2010; 121: Q119-E174.  Echo 04/22/2018  Left ventricle: The cavity size was normal. Wall thickness was increased in a pattern of mild LVH. Systolic function was normal. The estimated ejection fraction was in the range of 50% to 55%. Diffuse hypokinesis. The study is not technically sufficient to allow evaluation of LV diastolic function. - Aortic valve: There was severe regurgitation. - Left atrium: Volume/bsa, ES (1-plane Simpson&'s, A4C): 34.3 ml/m^2. CT consulted for AI. CT requested HF team to optimize prior to surgery.      ROS: All systems negative except as listed in HPI, PMH and Problem List.  SH:  Social History   Socioeconomic History  . Marital status: Unknown    Spouse name: Not on file  . Number of children: 2  . Years of education: Not on file  . Highest education level: Not on file  Occupational History  . Occupation: retired    Comment: Presenter, broadcasting  Social Needs  . Financial resource strain: Not on file  . Food insecurity:    Worry: Not on file    Inability: Not on file  . Transportation needs:    Medical: Not on file    Non-medical: Not on file  Tobacco Use  . Smoking status: Current Every Day Smoker    Packs/day: 0.50  Years: 50.00    Pack years: 25.00    Types: Cigarettes  . Smokeless tobacco: Never Used  Substance and Sexual Activity  . Alcohol use: No    Alcohol/week: 0.0 standard drinks  . Drug use: No  . Sexual activity: Not Currently  Lifestyle  . Physical activity:    Days per week: Not on file    Minutes per session: Not on file  . Stress: Not on file  Relationships    . Social connections:    Talks on phone: Not on file    Gets together: Not on file    Attends religious service: Not on file    Active member of club or organization: Not on file    Attends meetings of clubs or organizations: Not on file    Relationship status: Not on file  . Intimate partner violence:    Fear of current or ex partner: Not on file    Emotionally abused: Not on file    Physically abused: Not on file    Forced sexual activity: Not on file  Other Topics Concern  . Not on file  Social History Narrative   Divorced lives with father after incarceration x 5 yrs   1 son, 1 daughter   Volunteer work   3 caffeine/day   04/19/2015    FH:  Family History  Problem Relation Age of Onset  . COPD Mother   . Heart attack Mother   . Colon polyps Father   . Prostate cancer Father   . Parkinson's disease Father   . Hypertension Sister   . Hypertension Brother   . Breast cancer Sister   . Prostate cancer Paternal Uncle     Past Medical History:  Diagnosis Date  . Anxiety   . Arthritis    knees, neck, shoulder  . BPH (benign prostatic hyperplasia)   . CHF (congestive heart failure) ([place])   . Elevated PSA    denies per patient  . HTN (hypertension)   . Hyperplasia of prostate with lower urinary tract symptoms (LUTS)   . Hypertension   . 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 Wears hearing aid    bilateral    Current Outpatient Medications  Medication Sig Dispense Refill  . acetaminophen (TYLENOL) 500 MG tablet Take 500 mg by mouth 2 (two) times daily.    Marland Kitchen albuterol (PROVENTIL HFA;VENTOLIN HFA) 108 (90 BASE) MCG/ACT inhaler Inhale 2 puffs into the lungs every 6 (six) hours as needed for wheezing or shortness of breath. 1 Inhaler 0  . amiodarone (PACERONE) 200 MG tablet Take 1 tablet (200 mg total) by mouth 2 (two) times daily after a meal. For 7 days, then decrease to 200 mg daily 60 tablet 2  . Ascorbic Acid (VITAMIN C) 1000 MG tablet Take 1,000 mg  by mouth daily.    Marland Kitchen aspirin EC 325 MG EC tablet Take 1 tablet (325 mg total) by mouth daily. 30 tablet 0  . atorvastatin (LIPITOR) 20 MG tablet Take 1 tablet (20 mg total) by mouth daily at 6 PM. 30 tablet 3  . Azelastine HCl 0.15 % SOLN Place 2 sprays into both nostrils daily as needed (allergies).     Marland Kitchen BIOTIN PO Take 1 tablet by mouth daily.     Marland Kitchen buPROPion (WELLBUTRIN XL) 150 MG 24 hr tablet Take 150 mg daily by mouth.    Marland Kitchen CALCIUM PO Take 1 tablet by mouth daily.     . carvedilol (COREG) 25  MG tablet Take 1 tablet (25 mg total) by mouth 2 (two) times daily with a meal. 60 tablet 3  . clonazePAM (KLONOPIN) 1 MG tablet Take 1 mg by mouth 3 (three) times daily as needed for anxiety.    Marland Kitchen co-enzyme Q-10 30 MG capsule Take 30 mg by mouth 2 (two) times daily.    . DULoxetine (CYMBALTA) 60 MG capsule Take 60 mg daily by mouth.    . DUREZOL 0.05 % EMUL Place 1 drop into the right eye 4 (four) times daily.     . furosemide (LASIX) 40 MG tablet Take 1 tablet (40 mg total) by mouth daily. For 2 days, then take as needed for 3 lb weight gain 30 tablet 3  . montelukast (SINGULAIR) 10 MG tablet Take 10 mg daily by mouth.    . moxifloxacin (VIGAMOX) 0.5 % ophthalmic solution Place 1 drop into the right eye 4 (four) times daily.     Marland Kitchen omeprazole (PRILOSEC) 20 MG capsule Take 20 mg by mouth 2 (two) times daily before a meal.    . oxyCODONE (OXY IR/ROXICODONE) 5 MG immediate release tablet Take 1 tablet (5 mg total) by mouth every 4 (four) hours as needed for severe pain. 30 tablet 0  . polyethylene glycol powder (GLYCOLAX/MIRALAX) powder Take 17 g by mouth as needed for mild constipation.     . primidone (MYSOLINE) 50 MG tablet TAKE 1 TABLET BY MOUTH AT  BEDTIME (Patient taking differently: Take 50 mg by mouth at bedtime. ) 90 tablet 1  . QUEtiapine (SEROQUEL) 50 MG tablet Take 25 mg by mouth at bedtime.    . sodium chloride (OCEAN) 0.65 % SOLN nasal spray Place 1-2 sprays into both nostrils as needed for  congestion.    Marland Kitchen spironolactone (ALDACTONE) 25 MG tablet Take 0.5 tablets (12.5 mg total) by mouth daily. 30 tablet 3  . tamsulosin (FLOMAX) 0.4 MG CAPS capsule Take 0.4 mg daily by mouth.    Marland Kitchen tiZANidine (ZANAFLEX) 4 MG tablet Take 4 mg by mouth at bedtime.      No current facility-administered medications for this encounter.     Vitals:   05/15/18 0831  BP: 110/72  Pulse: 72  SpO2: 97%  Weight: 92.5 kg (204 lb)   Wt Readings from Last 3 Encounters:  05/15/18 92.5 kg (204 lb)  05/08/18 91.7 kg (202 lb 1.6 oz)  12/16/17 100.7 kg (222 lb)    PHYSICAL EXAM: General:  Well appearing. No resp difficulty. Walked in the clinic HEENT: normal Neck: supple. JVP flat. Carotids 2+ bilaterally; no bruits. No lymphadenopathy or thryomegaly appreciated. Cor: PMI normal. Regular rate & rhythm. No rubs, gallops or murmurs. Sternal incision approximated.  Lungs: clear Abdomen: soft, nontender, nondistended. No hepatosplenomegaly. No bruits or masses. Good bowel sounds. Extremities: no cyanosis, clubbing, rash, edema Neuro: alert & orientedx3, cranial nerves grossly intact. Moves all 4 extremities w/o difficulty. Affect pleasant.   ECG: SR 1st degree heart block 73 bpm    ASSESSMENT & PLAN: 1. Aortic Insufficiency - 04/22/2018 ECHO EF 50-55% with AV severe regurgitation  - s/p AVR/bentall 8/30 - he has follow up with Dr Roxy Manns.    2 .Chronic Diastolic HF.  NYHA II. Volume status stable.  Much improved after AVR.  Continue lasix needed for 3 pound weight gain.  - Continue 12.5 mg spiro daily.   3. Smoker  - Has not restarted smoking since discharge.  Congratulated.      4. Emphysema - Noted on CTA.  -  No wheeze. Lungs clear.   6. Mild nonobstructive CAD - LHC 04/27/18: Prox LAD to Mid LAD lesion is 40% stenosed. - LDL 53 8/28.  - On atorvastatin and asa.   7. Post-op AF with hypotension EKG today- in NSR.  Cut back amiodarone to 200 mg daily.  Would not expect to keep on  amio long term.    Follow up in 3 months  with Dr Donnamae Jude NP-C  8:48 AM

## 2018-05-15 NOTE — Patient Instructions (Signed)
DECREASE Amiodarone to 200 mg tablet ONCE daily.  Routine lab work today. Will notify you of abnormal results, otherwise no news is good news!  Follow up with Dr. Haroldine Laws in 3 months.  ___________________________________________________________________________ Travis Reese Code: 1900  Take all medication as prescribed the day of your appointment. Bring all medications with you to your appointment.  Do the following things EVERYDAY: 1) Weigh yourself in the morning before breakfast. Write it down and keep it in a log. 2) Take your medicines as prescribed 3) Eat low salt foods-Limit salt (sodium) to 2000 mg per day.  4) Stay as active as you can everyday 5) Limit all fluids for the day to less than 2 liters

## 2018-05-15 NOTE — Progress Notes (Signed)
Subjective:   Travis Reese was seen in consultation in the movement disorder clinic at the request of [place], PA-C.  The evaluation is for tremor.  Tremor started approximately a few months ago and is intermittent involves the bilateral UE.  Tremor is most noticeable when he is doing something like putting toothpaste on the toothbrush.  He does state that he has been weaning the klonopin for the last month from tid to qhs, about 3 days per week.   Uses melatonin on the other nights.  There is a family hx of tremor in his father, who was dx with PD at the age of 75.    Affected by caffeine:  No. (2, 16-oz coffee per day) Affected by alcohol:  Doesn't drink EtOH Affected by stress:  May or may not - "feels tense inside" Affected by fatigue:  No. Spills soup if on spoon:  No. Spills glass of liquid if full:  No. Affects ADL's (tying shoes, brushing teeth, etc):  No.   12/16/17 update: Patient is seen today in follow-up.  The patient was started on primidone last visit.  Patient reports that tremor is about 90% better but not completely gone.  He reports that he is now on seroquel for sleep.  He doesn't know the dosage but thinks that it may be 25 mg.  He did stop the zanaflex he was using for sleep; prior to doing that he was too deep in sleep and had incontinence, urinary.  better now that he d/c.  He did fall and broke a bone in his hand.  States that he was in the parking lot and there was a plant stand that fell onto of him when he passed it and it caught his shoe and he hit his face.  He does note some depression and trouble with motivation.  He notes some trouble going through the doors.  He will lose balance and bump into the door frame.  05/18/18 update: Patient seen today in follow-up for essential tremor.  He is on primidone, 50 mg daily.  Records are reviewed since our last visit.  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  He is now on amiodarone.  Current/Previously tried tremor medications: on klonopin but worked down from 1 mg tid to prn (about 1 time per week now per patient)  Current medications that may exacerbate tremor:  albuterol, singulair (using something about 2 times per day - doesn't think it changes tremor)  Outside reports reviewed: lab reports, office notes and referral letter/letters.  No Known Allergies  Outpatient Encounter Medications as of 05/18/2018  Medication Sig  . acetaminophen (TYLENOL) 500 MG tablet Take 500 mg by mouth 2 (two) times daily.  Marland Kitchen albuterol (PROVENTIL HFA;VENTOLIN HFA) 108 (90 BASE) MCG/ACT inhaler Inhale 2 puffs into the lungs every 6 (six) hours as needed for wheezing or shortness of breath.  Marland Kitchen amiodarone (PACERONE) 200 MG tablet Take 1 tablet (200 mg total) by mouth daily.  . Ascorbic Acid (VITAMIN C) 1000 MG tablet Take 1,000 mg by mouth daily.  Marland Kitchen aspirin EC 325 MG EC tablet Take 1 tablet (325 mg total) by mouth daily.  Marland Kitchen atorvastatin (LIPITOR) 20 MG tablet Take 1 tablet (20 mg total) by mouth daily at 6 PM.  . Azelastine HCl 0.15 % SOLN Place 2 sprays into both nostrils daily as needed (allergies).   Marland Kitchen BIOTIN PO Take 1 tablet by mouth  daily.   . buPROPion (WELLBUTRIN XL) 150 MG 24 hr tablet Take 150 mg daily by mouth.  Marland Kitchen CALCIUM PO Take 1 tablet by mouth daily.   . carvedilol (COREG) 25 MG tablet Take 1 tablet (25 mg total) by mouth 2 (two) times daily with a meal.  . clonazePAM (KLONOPIN) 1 MG tablet Take 1 mg by mouth 3 (three) times daily as needed for anxiety.  Marland Kitchen co-enzyme Q-10 30 MG capsule Take 30 mg by mouth 2 (two) times daily.  . DULoxetine (CYMBALTA) 60 MG capsule Take 60 mg daily by mouth.  . DUREZOL 0.05 % EMUL Place 1 drop into the right eye 4 (four) times daily.   . furosemide (LASIX) 40 MG tablet Take 1 tablet (40 mg total) by mouth daily. For 2 days, then take as needed for 3 lb weight gain  . montelukast (SINGULAIR) 10 MG tablet  Take 10 mg daily by mouth.  . moxifloxacin (VIGAMOX) 0.5 % ophthalmic solution Place 1 drop into the right eye 4 (four) times daily.   Marland Kitchen omeprazole (PRILOSEC) 20 MG capsule Take 20 mg by mouth 2 (two) times daily before a meal.  . oxyCODONE (OXY IR/ROXICODONE) 5 MG immediate release tablet Take 1 tablet (5 mg total) by mouth every 4 (four) hours as needed for severe pain.  . polyethylene glycol powder (GLYCOLAX/MIRALAX) powder Take 17 g by mouth as needed for mild constipation.   . primidone (MYSOLINE) 50 MG tablet Take 1 tablet (50 mg total) by mouth at bedtime.  Marland Kitchen QUEtiapine (SEROQUEL) 50 MG tablet Take 25 mg by mouth at bedtime.  . sodium chloride (OCEAN) 0.65 % SOLN nasal spray Place 1-2 sprays into both nostrils as needed for congestion.  Marland Kitchen spironolactone (ALDACTONE) 25 MG tablet Take 0.5 tablets (12.5 mg total) by mouth daily.  . tamsulosin (FLOMAX) 0.4 MG CAPS capsule Take 0.4 mg daily by mouth.  Marland Kitchen tiZANidine (ZANAFLEX) 4 MG tablet Take 4 mg by mouth at bedtime.   . [DISCONTINUED] primidone (MYSOLINE) 50 MG tablet TAKE 1 TABLET BY MOUTH AT  BEDTIME (Patient taking differently: Take 50 mg by mouth at bedtime. )   No facility-administered encounter medications on file as of 05/18/2018.     Past Medical History:  Diagnosis Date  . Anxiety   . Arthritis    knees, neck, shoulder  . BPH (benign prostatic hyperplasia)   . CHF (congestive heart failure) ([place])   . Elevated PSA    denies per patient  . HTN (hypertension)   . Hyperplasia of prostate with lower urinary tract symptoms (LUTS)   . Hypertension   . 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 Wears hearing aid    bilateral    Past Surgical History:  Procedure Laterality Date  . BENTALL PROCEDURE N/A 05/01/2018   Procedure: BIOLOGICAL BENTALL PROCEDURE AORTIC ROOT REPLACEMENT WITH 30MM VALSALVA GRAFT & 27MM INSPIRIS VALVE.;  Surgeon: Rexene Alberts, MD;  Location: [facility];  Service: Open Heart Surgery;  Laterality:  N/A;  . CATARACT EXTRACTION  04/2018  . COLONOSCOPY  2007  . NASAL SINUS SURGERY  2006  . RIGHT/LEFT HEART CATH AND CORONARY ANGIOGRAPHY N/A 04/27/2018   Procedure: RIGHT/LEFT HEART CATH AND CORONARY ANGIOGRAPHY;  Surgeon: Jolaine Artist, MD;  Location: [facility];  Service: Cardiovascular;  Laterality: N/A;  . TEE WITHOUT CARDIOVERSION N/A 04/27/2018   Procedure: TRANSESOPHAGEAL ECHOCARDIOGRAM (TEE);  Surgeon: Jolaine Artist, MD;  Location: [facility];  Service: Cardiovascular;  Laterality: N/A;  .  TEE WITHOUT CARDIOVERSION N/A 05/01/2018   Procedure: TRANSESOPHAGEAL ECHOCARDIOGRAM (TEE);  Surgeon: Rexene Alberts, MD;  Location: [facility];  Service: Open Heart Surgery;  Laterality: N/A;  . TRANSANAL HEMORRHOIDAL DEARTERIALIZATION N/A 11/15/2015   Procedure: TRANSANAL HEMORRHOIDAL DEARTERIALIZATION;  Surgeon: Leighton Ruff, MD;  Location: [facility];  Service: General;  Laterality: N/A;    Social History   Socioeconomic History  . Marital status: Unknown    Spouse name: Not on file  . Number of children: 2  . Years of education: Not on file  . Highest education level: Not on file  Occupational History  . Occupation: retired    Comment: Presenter, broadcasting  Social Needs  . Financial resource strain: Not on file  . Food insecurity:    Worry: Not on file    Inability: Not on file  . Transportation needs:    Medical: Not on file    Non-medical: Not on file  Tobacco Use  . Smoking status: Current Every Day Smoker    Packs/day: 0.50    Years: 50.00    Pack years: 25.00    Types: Cigarettes  . Smokeless tobacco: Never Used  Substance and Sexual Activity  . Alcohol use: No    Alcohol/week: 0.0 standard drinks  . Drug use: No  . Sexual activity: Not Currently  Lifestyle  . Physical activity:    Days per week: Not on file    Minutes per session: Not on file  . Stress: Not on file  Relationships  . Social connections:    Talks on phone: Not on file     Gets together: Not on file    Attends religious service: Not on file    Active member of club or organization: Not on file    Attends meetings of clubs or organizations: Not on file    Relationship status: Not on file  . Intimate partner violence:    Fear of current or ex partner: Not on file    Emotionally abused: Not on file    Physically abused: Not on file    Forced sexual activity: Not on file  Other Topics Concern  . Not on file  Social History Narrative   Divorced lives with father after incarceration x 5 yrs   1 son, 1 daughter   Volunteer work   3 caffeine/day   04/19/2015    Family Status  Relation Name Status  . Mother  Deceased  . Father  Alive  . Sister  Alive  . Brother  Alive  . Sister  Alive  . Annamarie Major  (Not Specified)  . Son  Alive  . Daughter  Alive    Review of Systems A complete 10 system ROS was obtained and was negative apart from what is mentioned.   Objective:   VITALS:   Vitals:   05/18/18 1351  BP: 136/84  Pulse: 74  SpO2: 94%  Weight: 206 lb (93.4 kg)  Height: 6\' 2"  (1.88 m)   GEN:  The patient appears stated age and is in NAD. HEENT:  Normocephalic, atraumatic.  The mucous membranes are moist. The superficial temporal arteries are without ropiness or tenderness. CV:  RRR Lungs:  CTAB Neck/HEME:  There are no carotid bruits bilaterally.  Neurological examination:  Orientation: The patient is alert and oriented x3. Cranial nerves: There is good facial symmetry. The speech is fluent and clear. Soft palate rises symmetrically and there is no tongue deviation. Hearing is intact to conversational tone.  Sensation: Sensation is intact to light touch throughout Motor: Strength is 5/5 in the bilateral upper and lower extremities.   Shoulder shrug is equal and symmetric.  There is no pronator drift.  Movement examination: Tone: There is normal tone in the RUE.  There is normal tone in the LUE.  There is normal tone in the RLE.  There is  normal tone in the LLE.  Abnormal movements: there is mild tremor of the outstretched hands Coordination:  There is no decremation with RAM's, with any form of RAMS, including alternating supination and pronation of the forearm, hand opening and closing, finger taps, heel taps and toe taps. Gait and Station: The patient has  difficulty arising out of a deep-seated chair without the use of the hands. The patient's stride length is good.     Labs:  Lab Results  Component Value Date   TSH 1.53 07/21/2017     Chemistry      Component Value Date/Time   NA 132 (L) 05/15/2018 0849   K 3.8 05/15/2018 0849   CL 94 (L) 05/15/2018 0849   CO2 24 05/15/2018 0849   BUN 12 05/15/2018 0849   CREATININE 0.92 05/15/2018 0849   CREATININE 0.77 07/21/2017 1401      Component Value Date/Time   CALCIUM 8.9 05/15/2018 0849   ALKPHOS 49 04/30/2018 0422   AST 22 04/30/2018 0422   ALT 15 04/30/2018 0422   BILITOT 1.0 04/30/2018 0422          Assessment/Plan:   1.  Essential Tremor.  -Improved with primidone, 50 mg daily.  He is doing well.  I may consider trying to stop in the future.  Just had major sx and didn't want to change things today  Has recently just been placed on amiodarone.  Discussed that this medication can increase tremor in about 33% of patients.  He doesn't think that tremor has worsened, nor do I  2.  F/u 9 months

## 2018-05-18 ENCOUNTER — Encounter: Payer: Self-pay | Admitting: Neurology

## 2018-05-18 ENCOUNTER — Ambulatory Visit (INDEPENDENT_AMBULATORY_CARE_PROVIDER_SITE_OTHER): Payer: Medicare Other | Admitting: Neurology

## 2018-05-18 VITALS — BP 136/84 | HR 74 | Ht 74.0 in | Wt 206.0 lb

## 2018-05-18 DIAGNOSIS — G250 Essential tremor: Secondary | ICD-10-CM | POA: Diagnosis not present

## 2018-05-18 MED ORDER — PRIMIDONE 50 MG PO TABS: 50.0000 mg | ORAL_TABLET | Freq: Every day | ORAL | 1 refills | Status: DC

## 2018-05-19 ENCOUNTER — Telehealth (HOSPITAL_COMMUNITY): Payer: Self-pay

## 2018-05-19 NOTE — Telephone Encounter (Signed)
Patient returned call stated he is currently exercising.  Closed referral

## 2018-05-19 NOTE — Telephone Encounter (Signed)
Attempted to call patient in regards to Cardiac Rehab - LM on VM 

## 2018-05-25 ENCOUNTER — Telehealth: Payer: Self-pay | Admitting: Neurology

## 2018-05-25 DIAGNOSIS — R202 Paresthesia of skin: Principal | ICD-10-CM

## 2018-05-25 DIAGNOSIS — M79609 Pain in unspecified limb: Secondary | ICD-10-CM

## 2018-05-25 NOTE — Telephone Encounter (Signed)
See if insurance will allow mri cervical spine without

## 2018-05-25 NOTE — Telephone Encounter (Signed)
Patient walked in the office to let us know about a new symptom. He states since his heart surgery he has had constant numbness in left pinky finger that travels inferior up the arm to the back of the shoulder. He also has pain this is worse while sleeping and has woken him up. Aware this could be positional during surgery or recovery, which he has discussed with his surgery, but it isn't getting better. Please advise.

## 2018-05-25 NOTE — Telephone Encounter (Signed)
Patient made aware recommendation is MR. He is agreeable to proceed. Order sent to [facility] and they will call him directly to schedule.

## 2018-05-26 ENCOUNTER — Telehealth: Payer: Self-pay

## 2018-05-26 NOTE — Telephone Encounter (Signed)
Patient called concerned about continuing to take his Lasix after discharge from hospital on 05/08/2018.  He is s/p Bentall with Dr. Roxy Manns on 05/01/18.  He stated that he has been taking his lasix daily and has lost 10 pounds since being discharged from the hospital.  I advised patient that per his discharge instructions he was to only take the medication for 2 days and then ONLY take the medication as needed for a 3 pound weight gain over night.  He stated he was unaware of this and would change how he took the medication.  Patient also made aware that he could also contact his Cardiologist with any questions or concerns.  He acknowledged receipt and is aware of his upcoming appointment with Dr. Roxy Manns in October.

## 2018-06-04 ENCOUNTER — Other Ambulatory Visit: Payer: Self-pay | Admitting: Thoracic Surgery (Cardiothoracic Vascular Surgery)

## 2018-06-04 DIAGNOSIS — Z953 Presence of xenogenic heart valve: Secondary | ICD-10-CM

## 2018-06-08 ENCOUNTER — Other Ambulatory Visit: Payer: Self-pay

## 2018-06-08 ENCOUNTER — Ambulatory Visit
Admission: RE | Admit: 2018-06-08 | Discharge: 2018-06-08 | Disposition: A | Discharge status: Home / Self Care | Payer: Medicare Other | Source: Ambulatory Visit | Attending: Thoracic Surgery (Cardiothoracic Vascular Surgery) | Admitting: Thoracic Surgery (Cardiothoracic Vascular Surgery)

## 2018-06-08 ENCOUNTER — Encounter: Payer: Self-pay | Admitting: Thoracic Surgery (Cardiothoracic Vascular Surgery)

## 2018-06-08 ENCOUNTER — Ambulatory Visit (INDEPENDENT_AMBULATORY_CARE_PROVIDER_SITE_OTHER): Payer: Self-pay | Admitting: Thoracic Surgery (Cardiothoracic Vascular Surgery)

## 2018-06-08 VITALS — BP 148/98 | HR 79 | Resp 18 | Ht 74.0 in | Wt 203.6 lb

## 2018-06-08 DIAGNOSIS — Z953 Presence of xenogenic heart valve: Secondary | ICD-10-CM

## 2018-06-08 NOTE — Progress Notes (Signed)
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  Preoperatively the patient was evaluated and managed by the advanced heart failure team under the care and direction of Dr. Haroldine Laws.  Postoperatively the patient developed transient postoperative atrial fibrillation for which she was treated with amiodarone.  His recovery was otherwise uneventful and he was discharged from the hospital on the sixth postoperative day.  Since then he has been seen in follow-up in the advanced heart failure clinic on May 15, 2018.  He returns to the office today and reports that he is doing exceptionally well.  He states that his breathing is already dramatically better than it was prior to surgery, and really is better than it has been for quite some time.  He has minimal residual soreness in his chest.  Appetite is good.  He is walking every day.  He has no dizzy spells.  Weight is been stable and he has been off of diuretics for more than 2 weeks now.  Overall he is delighted with his progress.   Current Outpatient Medications  Medication Sig Dispense Refill  . acetaminophen (TYLENOL) 500 MG tablet Take 500 mg by mouth 2 (two) times daily.    Marland Kitchen amiodarone (PACERONE) 200 MG tablet Take 1 tablet (200 mg total) by mouth daily. 30 tablet 11  . Ascorbic  Acid (VITAMIN C) 1000 MG tablet Take 1,000 mg by mouth daily.    Marland Kitchen aspirin EC 325 MG EC tablet Take 1 tablet (325 mg total) by mouth daily. 30 tablet 0  . atorvastatin (LIPITOR) 20 MG tablet Take 1 tablet (20 mg total) by mouth daily at 6 PM. 30 tablet 3  . BIOTIN PO Take 1 tablet by mouth daily.     Marland Kitchen buPROPion (WELLBUTRIN XL) 150 MG 24 hr tablet Take 150 mg daily by mouth.    Marland Kitchen CALCIUM PO Take 1 tablet by mouth daily.     . carvedilol (COREG) 25 MG tablet Take 1 tablet (25 mg total) by mouth 2 (two) times daily with a meal. 60 tablet 3  . clonazePAM (KLONOPIN) 1 MG tablet Take 1 mg by mouth daily as needed for anxiety.     Marland Kitchen co-enzyme Q-10 30 MG capsule Take 30 mg by mouth 2 (two) times daily.    . DULoxetine (CYMBALTA) 60 MG capsule Take 60 mg daily by mouth.    . DUREZOL 0.05 % EMUL Place 1 drop into the right eye 4 (four) times daily.     . furosemide (LASIX) 40 MG tablet Take 1 tablet (40 mg total) by mouth daily. For 2 days, then take as needed for 3 lb weight gain 30 tablet 3  . montelukast (SINGULAIR) 10 MG tablet Take 10 mg daily by mouth.    . moxifloxacin (VIGAMOX) 0.5 %  ophthalmic solution Place 1 drop into the right eye 4 (four) times daily.     Marland Kitchen omeprazole (PRILOSEC) 20 MG capsule Take 20 mg by mouth 2 (two) times daily before a meal.    . polyethylene glycol powder (GLYCOLAX/MIRALAX) powder Take 17 g by mouth as needed for mild constipation.     . primidone (MYSOLINE) 50 MG tablet Take 1 tablet (50 mg total) by mouth at bedtime. 90 tablet 1  . QUEtiapine (SEROQUEL) 50 MG tablet Take 25 mg by mouth at bedtime.    . sodium chloride (OCEAN) 0.65 % SOLN nasal spray Place 1-2 sprays into both nostrils as needed for congestion.    Marland Kitchen spironolactone (ALDACTONE) 25 MG tablet Take 0.5 tablets (12.5 mg total) by mouth daily. 30 tablet 3  . tamsulosin (FLOMAX) 0.4 MG CAPS capsule Take 0.4 mg daily by mouth.    Marland Kitchen tiZANidine (ZANAFLEX) 4 MG tablet Take 4 mg by mouth at bedtime.      No  current facility-administered medications for this visit.       Physical Exam:   BP (!) 148/98 (BP Location: Left Arm, Patient Position: Sitting, Cuff Size: Normal)   Pulse 79   Resp 18   Ht 6\' 2"  (1.88 m)   Wt 203 lb 9.6 oz (92.4 kg)   SpO2 97% Comment: RA  BMI 26.14 kg/m   General:  Well-appearing  Chest:   Clear to auscultation  CV:   Regular rate and rhythm without murmur  Incisions:  Healing nicely, sternum is stable  Abdomen:  Soft nontender  Extremities:  Warm and well-perfused, no edema  Diagnostic Tests:  CHEST - 2 VIEW  COMPARISON:  None.  FINDINGS: Mild emphysematous hyperinflation of the lungs without pulmonary consolidation, effusion or pneumothorax. Linear density at the left lung base may reflect an area of pulmonary scarring. Heart size is normal. Tortuous atherosclerotic aorta with evidence of prior aortic valvular replacement. No acute nor suspicious osseous abnormalities.  IMPRESSION: Emphysematous hyperinflation of the lungs. Tortuous atherosclerotic aorta. Status post AVR.   Electronically Signed   By: Ashley Royalty M.D.   On: 06/08/2018 13:45   Impression:  Patient is doing very well more than 1 month status post biological Bentall aortic root replacement using a stented bovine pericardial tissue valve with synthetic aortic root conduit for stage D severe symptomatic primary aortic insufficiency with acute diastolic congestive heart failure.    Plan:  I have instructed the patient to stop taking amiodarone.  He will remain on all other medications without change at this time.  I have encouraged the patient to continue to gradually increase his physical activity but to refrain from any heavy lifting or strenuous use of his arms or shoulders for at least another 2 months.  I have encouraged him to enroll and participate in the cardiac rehab program.  He may resume driving an automobile.  All of his questions been addressed.  The patient will  return to our office for routine follow-up in approximately 2 months.  He will call and return sooner should specific problems or questions arise.    Valentina Gu. Roxy Manns, MD 06/08/2018 2:17 PM

## 2018-06-08 NOTE — Patient Instructions (Addendum)
Stop taking amiodarone.  Continue all other previous medications without any changes at this time  Continue to avoid any heavy lifting or strenuous use of your arms or shoulders for at least a total of three months from the time of surgery.  After three months you may gradually increase how much you lift or otherwise use your arms or chest as tolerated, with limits based upon whether or not activities lead to the return of significant discomfort.  You are encouraged to enroll and participate in the outpatient cardiac rehab program beginning as soon as practical.  You may return to driving an automobile as long as you are no longer requiring oral narcotic pain relievers during the daytime.  It would be wise to start driving only short distances during the daylight and gradually increase from there as you feel comfortable.  Endocarditis is a potentially serious infection of heart valves or inside lining of the heart.  It occurs more commonly in patients with diseased heart valves (such as patient's with aortic or mitral valve disease) and in patients who have undergone heart valve repair or replacement.  Certain surgical and dental procedures may put you at risk, such as dental cleaning, other dental procedures, or any surgery involving the respiratory, urinary, gastrointestinal tract, gallbladder or prostate gland.   To minimize your chances for develooping endocarditis, maintain good oral health and seek prompt medical attention for any infections involving the mouth, teeth, gums, skin or urinary tract.    Always notify your doctor or dentist about your underlying heart valve condition before having any invasive procedures. You will need to take antibiotics before certain procedures, including all routine dental cleanings or other dental procedures.  Your cardiologist or dentist should prescribe these antibiotics for you to be taken ahead of time.

## 2018-06-15 ENCOUNTER — Other Ambulatory Visit: Payer: Medicare Other

## 2018-06-15 ENCOUNTER — Telehealth: Payer: Self-pay | Admitting: Neurology

## 2018-06-15 NOTE — Telephone Encounter (Signed)
I did it went into medical review

## 2018-06-15 NOTE — Telephone Encounter (Signed)
Es, insurance information updated in chart. Can you work on MR auth please?

## 2018-06-15 NOTE — Telephone Encounter (Signed)
Patient just called and gave [place] the new insurance information over the phone. He states that his MRI was canceled due to the ins information not being correct. The new insurance is [employer] [place] [phone] he is not sure if he will need prior auth for the MRI.

## 2018-06-19 ENCOUNTER — Other Ambulatory Visit: Payer: Self-pay

## 2018-06-19 NOTE — Patient Outreach (Signed)
[facility]) Care Management  06/19/2018  Elizabeth Haff [place] 1946-07-23 [id number]   Medication Adherence call to Mr. Zenia Resides Garoutte left a message for patient to call back patient is due on Lisinopril 30 mg. Mr. Rohl is showing past due under [place].   [facility] Direct Dial [phone]  Fax [phone] [email or web address]

## 2018-06-24 ENCOUNTER — Telehealth: Payer: Self-pay

## 2018-06-24 NOTE — Telephone Encounter (Signed)
Kim from [facility] in [place] contacted the office [phone] requesting a medication list that the patient is currently on.  She stated he came into their office today, who has not been seen since July 2019, and was not sure what medications he was taking since he was discharged from the hospital after having a Bentall and aortic valve replacement 05/01/2018.  Medications were went over with Maudie Mercury and she acknowledged receipt.

## 2018-06-27 ENCOUNTER — Ambulatory Visit
Admission: RE | Admit: 2018-06-27 | Discharge: 2018-06-27 | Disposition: A | Discharge status: Home / Self Care | Payer: Medicare HMO | Source: Ambulatory Visit | Attending: Neurology | Admitting: Neurology

## 2018-06-27 DIAGNOSIS — M79609 Pain in unspecified limb: Secondary | ICD-10-CM

## 2018-06-27 DIAGNOSIS — R202 Paresthesia of skin: Principal | ICD-10-CM

## 2018-06-29 ENCOUNTER — Telehealth: Payer: Self-pay | Admitting: Neurology

## 2018-06-29 DIAGNOSIS — M79609 Pain in unspecified limb: Secondary | ICD-10-CM

## 2018-06-29 DIAGNOSIS — R202 Paresthesia of skin: Principal | ICD-10-CM

## 2018-06-29 NOTE — Telephone Encounter (Signed)
Dana scheduled.

## 2018-06-29 NOTE — Telephone Encounter (Signed)
-----   Message from [place], DO sent at 06/29/2018 11:40 AM EDT ----- Let pt know that he definitely has some pinched nerves but doesn't really look like the one that goes to the left pinky finger.  Schedule for EMG before referring to neurosx

## 2018-06-29 NOTE — Telephone Encounter (Signed)
Patient made aware. He would like to proceed with EMG. Order entered.   Travis Reese - Can you call to schedule?

## 2018-06-30 ENCOUNTER — Ambulatory Visit (INDEPENDENT_AMBULATORY_CARE_PROVIDER_SITE_OTHER): Payer: Medicare HMO | Admitting: Neurology

## 2018-06-30 ENCOUNTER — Telehealth: Payer: Self-pay | Admitting: Neurology

## 2018-06-30 DIAGNOSIS — M79609 Pain in unspecified limb: Secondary | ICD-10-CM | POA: Diagnosis not present

## 2018-06-30 DIAGNOSIS — R202 Paresthesia of skin: Secondary | ICD-10-CM

## 2018-06-30 DIAGNOSIS — G5603 Carpal tunnel syndrome, bilateral upper limbs: Secondary | ICD-10-CM

## 2018-06-30 DIAGNOSIS — G5623 Lesion of ulnar nerve, bilateral upper limbs: Secondary | ICD-10-CM

## 2018-06-30 NOTE — Procedures (Signed)
[facility]  [address], [place]  [place], [place] [address] Tel: [phone] Fax:  [phone] Test Date:  06/30/2018  Patient: Travis Reese DOB: 04-26-1946 Physician: Narda Amber, DO  Sex: Male Height: 6\' 2"  Ref Phys: Alonza Bogus, DO  ID#: [id number] Temp: 32.0C Technician:    Patient Complaints: This is a 72 year old man referred for evaluation of left fifth finger numbness and pain following cardiac surgery.  NCV & EMG Findings: Extensive electrodiagnostic testing of the left upper extremity and additional studies of the right shows:  1. Bilateral median sensory responses show prolonged latencies and normal amplitude.  Bilateral ulnar sensory responses show prolonged latency with asymmetrically reduced amplitude on the left.  Bilateral radial sensory responses are within normal.  Bilateral medial and lateral antebrachial cutaneous sensory responses are absent, which is normal for patient's age. 2. Left ulnar motor response shows prolonged distal onset latency (3.3 ms), reduced amplitude (5.4 mV), and decreased conduction velocity (A Elbow-B Elbow, 36 m/s).  Right ulnar motor response shows conduction velocity slowing across the elbow (A Elbow-B Elbow, 43 m/s), with normal latency and amplitude.  Bilateral median motor responses are within normal limits, with evidence of a median-to-ulnar crossover in the forearm on the left, consistent with a Martin-Gruber anastomosis. 3. Chronic motor axonal loss changes are seen affecting the ulnar innervated muscles, abductor pollicis brevis, biceps, and deltoid muscles on the left.  There is no evidence of accompanied active denervation.  Impression: 1. Left ulnar neuropathy with slowing across the elbow, demyelinating and axonal loss type, which is at least moderate in degree electrically. 2. Right ulnar neuropathy with slowing across the elbow, purely demyelinating type and mild in degree electrically. 3. Bilateral median  neuropathy at or distal to the wrist, consistent with a clinical diagnosis of carpal tunnel syndrome, which is mild in degree electrically. 4. 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 5. Incidentally, there is a left Martin-Gruber anastomosis, normal variant.   ___________________________ Narda Amber, DO    Nerve Conduction Studies Anti Sensory Summary Table   Site NR Peak (ms) Norm Peak (ms) P-T Amp (V) Norm P-T Amp  Left Lat Ante Brach Cutan Anti Sensory (Lat Forearm)  32C  Lat Biceps NR  <2.8  >10  Right Lat Ante Brach Cutan Anti Sensory (Lat Forearm)  32C  Lat Biceps NR  <2.8  >10  Left Med Ante Brach Cutan Anti Sensory (Med Forearm)  32C  Elbow NR      Right Med Ante Brach Cutan Anti Sensory (Med Forearm)  32C  Elbow NR      Left Median Anti Sensory (2nd Digit)  32C  Wrist    4.5 <3.8 18.7 >10  Right Median Anti Sensory (2nd Digit)  32C  Wrist    4.1 <3.8 16.3 >10  Left Radial Anti Sensory (Base 1st Digit)  32C  Wrist    2.6 <2.8 15.0 >10  Right Radial Anti Sensory (Base 1st Digit)  32C  Wrist    2.6 <2.8 13.9 >10  Left Ulnar Anti Sensory (5th Digit)  32C  Wrist    3.7 <3.2 5.0 >5  Right Ulnar Anti Sensory (5th Digit)  32C  Wrist    3.6 <3.2 9.6 >5   Motor Summary Table   Site NR Onset (ms) Norm Onset (ms) O-P Amp (mV) Norm O-P Amp Site1 Site2 Delta-0 (ms) Dist (cm) Vel (m/s) Norm Vel (m/s)  Left Median Motor (Abd Poll Brev)  32C  Wrist  4.0 <4.0 5.2 >5 Elbow Wrist 5.7 29.0 51 >50  Elbow    9.7  6.2  Ulnar-wrist crossover Elbow 5.1 0.0    Ulnar-wrist crossover    4.6  2.4         Right Median Motor (Abd Poll Brev)  32C  Wrist    3.4 <4.0 10.8 >5 Elbow Wrist 5.9 30.0 51 >50  Elbow    9.3  10.5         Left Ulnar Motor (Abd Dig Minimi)  32C  Wrist    3.3 <3.1 5.4 >7 B Elbow Wrist 4.1 25.0 61 >50  B Elbow    7.4  4.6  A Elbow B Elbow 2.8 10.0 36 >50  A Elbow    10.2  3.6         Right Ulnar Motor (Abd Dig Minimi)  32C  Wrist    2.9  <3.1 8.4 >7 B Elbow Wrist 4.4 25.0 57 >50  B Elbow    7.3  8.0  A Elbow B Elbow 2.3 10.0 43 >50  A Elbow    9.6  7.4          EMG   Side Muscle Ins Act Fibs Psw Fasc Number Recrt Dur Dur. Amp Amp. Poly Poly. Comment  Left Triceps Nml Nml Nml Nml Nml Nml Nml Nml Nml Nml Nml Nml N/A  Left PronatorTeres Nml Nml Nml Nml Nml Nml Nml Nml Nml Nml Nml Nml N/A  Left Abd Poll Brev Nml Nml Nml Nml 1- Rapid Some 1+ Some 1+ Nml Nml N/A  Left ABD Dig Min Nml Nml Nml Nml 1- Rapid Some 1+ Some 1+ Nml Nml N/A  Left Biceps Nml Nml Nml Nml 1- Rapid Few 1+ Few 1+ Nml Nml N/A  Left Deltoid Nml Nml Nml Nml 1- Rapid Some 1+ Some 1+ Nml Nml N/A  Left FlexCarpiUln Nml Nml Nml Nml 1- Rapid Some 1+ Some 1+ Some 1+ N/A  Left 1stDorInt Nml Nml Nml Nml 1- Rapid Some 1+ Some 1+ Nml Nml N/A      Waveforms:

## 2018-06-30 NOTE — Telephone Encounter (Signed)
-----   Message from [place], DO sent at 06/30/2018  3:07 PM EDT ----- Let pt know that the problem causing sx is likely ulnar neuropathy at elbow (pinched nerve at elbow).  Has it on both sides, but much worse on the L.  Refer to Dr. Amedeo Plenty or Dr. Caralyn Guile.  Send copy of EMG

## 2018-07-01 NOTE — Telephone Encounter (Signed)
Spoke with patient and made him aware of results. He already sees Dr. Donivan Scull for orthopaedics in [place]. EMG sent to Dr. Donivan Scull in [place] at Fax # [phone] with confirmation received. He will call with any questions.

## 2018-07-22 ENCOUNTER — Telehealth (HOSPITAL_COMMUNITY): Payer: Self-pay

## 2018-07-22 NOTE — Telephone Encounter (Signed)
Pt called and stated that he was visiting his father in the hospital yesterday and he became lightheaded and dizzy. Nurse checked bp and it was 96/62 which is not normal for him. Pt also states that he had a valve replacement on 08/30 and didn't know if the dropping in bp was caused by surgery. Please advise.

## 2018-07-22 NOTE — Telephone Encounter (Signed)
Would not make any changes with the stressful nature of visiting his father in the hospital, if recurs please let us know and should come for RN visit BP check IF RECURS.   Otherwise, would follow and keep appointment for December with Dr. Haroldine Laws.     Legrand Como [address] [place], PA-C 07/22/2018 10:06 AM

## 2018-07-22 NOTE — Telephone Encounter (Signed)
Left message on VM.

## 2018-08-17 ENCOUNTER — Encounter (HOSPITAL_COMMUNITY): Payer: Medicare Other | Admitting: Internal Medicine

## 2018-09-07 ENCOUNTER — Encounter: Payer: Self-pay | Admitting: Thoracic Surgery (Cardiothoracic Vascular Surgery)

## 2018-09-17 ENCOUNTER — Ambulatory Visit: Payer: Medicare Other | Admitting: Thoracic Surgery (Cardiothoracic Vascular Surgery)

## 2018-09-17 ENCOUNTER — Other Ambulatory Visit: Payer: Self-pay

## 2018-09-17 ENCOUNTER — Encounter: Payer: Self-pay | Admitting: Thoracic Surgery (Cardiothoracic Vascular Surgery)

## 2018-09-17 VITALS — BP 135/89 | HR 74 | Resp 16 | Ht 74.0 in | Wt 208.0 lb

## 2018-09-17 DIAGNOSIS — I351 Nonrheumatic aortic (valve) insufficiency: Secondary | ICD-10-CM

## 2018-09-17 DIAGNOSIS — Z953 Presence of xenogenic heart valve: Secondary | ICD-10-CM | POA: Diagnosis not present

## 2018-09-17 NOTE — Patient Instructions (Addendum)
Continue all previous medications without any changes at this time  You may resume unrestricted physical activity without any particular limitations at this time.  Make every effort to stay physically active, get some type of exercise on a regular basis, and stick to a "heart healthy diet".  The long term benefits for regular exercise and a healthy diet are critically important to your overall health and wellbeing.  Check your weight on a regular basis and keep a log for your records.  Look for signs of fluid overload such as worsening swelling of your lower legs, increased shortness of breath with activity, and/or a dry nonproductive cough.  Discussed these findings with your cardiologist including whether or not you should adjust your fluid pill dosage (diuretic).  Call to reschedule your appointment ASAP with Dr. Haroldine Laws in the [facility] or with Dr. Ena Dawley at [facility].  Endocarditis is a potentially serious infection of heart valves or inside lining of the heart.  It occurs more commonly in patients with diseased heart valves (such as patient's with aortic or mitral valve disease) and in patients who have undergone heart valve repair or replacement.  Certain surgical and dental procedures may put you at risk, such as dental cleaning, other dental procedures, or any surgery involving the respiratory, urinary, gastrointestinal tract, gallbladder or prostate gland.   To minimize your chances for develooping endocarditis, maintain good oral health and seek prompt medical attention for any infections involving the mouth, teeth, gums, skin or urinary tract.    Always notify your doctor or dentist about your underlying heart valve condition before having any invasive procedures. You will need to take antibiotics before certain procedures, including all routine dental cleanings or other dental procedures.  Your cardiologist or dentist should prescribe these antibiotics  for you to be taken ahead of time.

## 2018-09-17 NOTE — Progress Notes (Signed)
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  He was last seen here in our office on June 08, 2018 at which time he was doing well.  Since then he has continued to do well physically, although he admits that he has stopped exercising on a regular basis since he finished the cardiac rehab program.  He recently got a cat and he does walk to the mailbox every day.  Otherwise he is not very active physically.  Last month he had problems with an upper respiratory tract infection.  He thought he might have the flu and was treated by his primary care physician.  Flu test was negative.  He ultimately was treated with a course of antibiotics and symptoms resolved.  He canceled his follow-up appointment with Dr. Haroldine Laws in the advanced heart failure clinic and has not bothered to reschedule it.  He returns her office today and reports that he is physically doing well.  He does note that he has some mild swelling around his ankles, but he blames this on spending most of his day in a chair sitting.  He has not had shortness of breath.  When his weight goes up he takes Lasix, but he does not take it on a daily basis consistently.  He has not been taking Spironolactone as previously prescribed.  Appetite is good.  He no longer has any pain in his  chest.  He denies any shortness of breath.   Current Outpatient Medications  Medication Sig Dispense Refill  . acetaminophen (TYLENOL) 500 MG tablet Take 500 mg by mouth 2 (two) times daily.    . Ascorbic Acid (VITAMIN C) 1000 MG tablet Take 1,000 mg by mouth daily.    Marland Kitchen aspirin EC 325 MG EC tablet Take 1 tablet (325 mg total) by mouth daily. 30 tablet 0  . BIOTIN PO Take 1 tablet by mouth daily.     Marland Kitchen CALCIUM PO Take 1 tablet by mouth daily.     . carvedilol (COREG) 25 MG tablet Take 1 tablet (25 mg total) by mouth 2 (two) times daily with a meal. 60 tablet 3  . clonazePAM (KLONOPIN) 1 MG tablet Take 1 mg by mouth daily as needed for anxiety.     Marland Kitchen co-enzyme Q-10 30 MG capsule Take 30 mg by mouth 2 (two) times daily.    . furosemide (LASIX) 40 MG tablet Take 1 tablet (40 mg total) by mouth daily. For 2 days, then take as needed for 3 lb weight gain 30 tablet 3  . montelukast (SINGULAIR) 10 MG tablet Take 10 mg daily by mouth.    Marland Kitchen omeprazole (PRILOSEC) 20 MG capsule Take 20 mg by mouth 2 (two) times daily before a meal.    . polyethylene glycol powder (GLYCOLAX/MIRALAX) powder Take  17 g by mouth as needed for mild constipation.     . primidone (MYSOLINE) 50 MG tablet Take 1 tablet (50 mg total) by mouth at bedtime. 90 tablet 1  . sodium chloride (OCEAN) 0.65 % SOLN nasal spray Place 1-2 sprays into both nostrils as needed for congestion.    Marland Kitchen spironolactone (ALDACTONE) 25 MG tablet Take 0.5 tablets (12.5 mg total) by mouth daily. 30 tablet 3  . tamsulosin (FLOMAX) 0.4 MG CAPS capsule Take 0.8 mg by mouth daily.     Marland Kitchen tiZANidine (ZANAFLEX) 4 MG tablet Take 4 mg by mouth at bedtime.      No current facility-administered medications for this visit.       Physical Exam:   BP 135/89 (BP Location: Left Arm, Patient Position: Sitting, Cuff Size: Large)   Pulse 74   Resp 16   Ht 6\' 2"  (1.88 m)   Wt 208 lb (94.3 kg)   SpO2 95% Comment: RA  BMI 26.71 kg/m    General:  Well-appearing  Chest:   Clear to auscultation  CV:   Regular rate and rhythm without murmur  Incisions:  Well-healed, sternum is stable  Abdomen:  Soft nontender  Extremities:  Warm and well-perfused with trace to mild lower extremity edema  Diagnostic Tests:  n/a   Impression:  Patient is doing well more than 4 months status post Bentall aortic root replacement using a bioprosthetic tissue valve and synthetic aortic root conduit.  He has mild lower extremity edema but denies symptoms of exertional shortness of breath.  He admits that he has become more sedentary.  He has not had a follow-up echocardiogram since surgery and he canceled his last office visit in the advanced heart failure clinic.  Plan:  We have not recommended any change the patient's current medications.  However, I have suggested that the patient probably should resume taking Spironolactone as had previously been prescribed, and he may need to take Lasix more consistently if he continues to have lower extremity edema.  I have encouraged him to make an effort to try to at least go on a good walk once or twice daily and to try to keep his legs propped up when he is not up and ambulatory.  I have strongly emphasized the need to reschedule his appointment with Dr. Haroldine Laws and reminded him that he will need a follow-up echocardiogram performed.  The patient has been reminded regarding the importance of dental hygiene and the lifelong need for antibiotic prophylaxis for all dental cleanings and other related invasive procedures.  The patient will return to our office for routine follow-up next August, approximately 1 year following his surgery.  He will call and return sooner should specific problems or questions arise.   I spent in excess of 15 minutes during the conduct of this office consultation and >50% of this time involved direct face-to-face encounter with the patient for counseling and/or coordination of  their care.    Valentina Gu. Roxy Manns, MD 09/17/2018 11:09 AM

## 2018-10-02 ENCOUNTER — Other Ambulatory Visit: Payer: Self-pay | Admitting: Physician Assistant

## 2018-10-02 DIAGNOSIS — E785 Hyperlipidemia, unspecified: Secondary | ICD-10-CM | POA: Diagnosis not present

## 2018-10-02 DIAGNOSIS — E559 Vitamin D deficiency, unspecified: Secondary | ICD-10-CM | POA: Diagnosis not present

## 2018-10-02 DIAGNOSIS — G4700 Insomnia, unspecified: Secondary | ICD-10-CM | POA: Diagnosis not present

## 2018-10-02 DIAGNOSIS — Z79899 Other long term (current) drug therapy: Secondary | ICD-10-CM | POA: Diagnosis not present

## 2018-10-12 ENCOUNTER — Other Ambulatory Visit: Payer: Self-pay | Admitting: Physician Assistant

## 2018-10-13 ENCOUNTER — Other Ambulatory Visit (HOSPITAL_COMMUNITY): Payer: Self-pay

## 2018-10-13 MED ORDER — CARVEDILOL 25 MG PO TABS: 25.0000 mg | ORAL_TABLET | Freq: Two times a day (BID) | ORAL | 1 refills | Status: DC

## 2018-10-15 ENCOUNTER — Other Ambulatory Visit: Payer: Self-pay | Admitting: Physician Assistant

## 2018-10-27 DIAGNOSIS — E785 Hyperlipidemia, unspecified: Secondary | ICD-10-CM | POA: Diagnosis not present

## 2018-11-24 ENCOUNTER — Other Ambulatory Visit: Payer: Self-pay | Admitting: Physician Assistant

## 2018-11-30 ENCOUNTER — Other Ambulatory Visit: Payer: Self-pay | Admitting: Physician Assistant

## 2018-12-01 ENCOUNTER — Other Ambulatory Visit (HOSPITAL_COMMUNITY): Payer: Self-pay | Admitting: Internal Medicine

## 2018-12-08 ENCOUNTER — Encounter (HOSPITAL_COMMUNITY): Payer: Self-pay

## 2018-12-08 ENCOUNTER — Telehealth: Payer: Self-pay | Admitting: Cardiology

## 2018-12-08 ENCOUNTER — Ambulatory Visit (HOSPITAL_COMMUNITY)
Admission: RE | Admit: 2018-12-08 | Discharge: 2018-12-08 | Disposition: A | Discharge status: Home / Self Care | Payer: Medicare Other | Source: Ambulatory Visit | Attending: Internal Medicine | Admitting: Internal Medicine

## 2018-12-08 ENCOUNTER — Other Ambulatory Visit: Payer: Self-pay

## 2018-12-08 DIAGNOSIS — Z952 Presence of prosthetic heart valve: Secondary | ICD-10-CM

## 2018-12-08 DIAGNOSIS — I5032 Chronic diastolic (congestive) heart failure: Secondary | ICD-10-CM

## 2018-12-08 DIAGNOSIS — I2510 Atherosclerotic heart disease of native coronary artery without angina pectoris: Secondary | ICD-10-CM

## 2018-12-08 MED ORDER — SPIRONOLACTONE 25 MG PO TABS: 12.5000 mg | ORAL_TABLET | Freq: Every day | ORAL | 3 refills | Status: DC

## 2018-12-08 NOTE — Progress Notes (Signed)
Spoke to pt. Reviewed avs instructions. Refilled cardiac meds. Made pt aware that he will only need to follow up with [place] as needed and to be expecting a call from Dr. Wendy Poet office to schedule an appointment. Pt verbalized understanding and is agreeable.

## 2018-12-08 NOTE — Addendum Note (Signed)
Encounter addended by: Marlise Eves, RN on: 12/08/2018 12:28 PM  Actions taken: Pharmacy for encounter modified, Order list changed, Clinical Note Signed

## 2018-12-08 NOTE — Patient Instructions (Signed)
You have graduated from the [facility]! You only need to follow up with [place] as needed.   Please Follow up with Dr. Agustin Cree in 6 months with an echocardiogram.  Your physician has requested that you have an echocardiogram. Echocardiography is a painless test that uses sound waves to create images of your heart. It provides your doctor with information about the size and shape of your heart and how well your heart's chambers and valves are working. This procedure takes approximately one hour. There are no restrictions for this procedure. This will be done at your appointment with Dr. Agustin Cree.

## 2018-12-08 NOTE — Telephone Encounter (Signed)
Pt in workque needing new pt appt referred my Dr Haroldine Laws, please advise

## 2018-12-08 NOTE — Addendum Note (Signed)
Encounter addended by: Marlise Eves, RN on: 12/08/2018 12:07 PM  Actions taken: Clinical Note Signed, Order list changed, Diagnosis association updated

## 2018-12-08 NOTE — Progress Notes (Signed)
Heart Failure TeleHealth Note  Due to national recommendations of social distancing due to [place] 19, Audio/video telehealth visit is felt to be most appropriate for this patient at this time.  See MyChart message from today for patient consent regarding telehealth for [facility].  Date:  12/08/2018   ID:  Travis Reese, DOB [birth year], MRN [id number]  Location: Home  Provider location: [facility] Type of Visit: Established  PCP:  Travis Limbo, PA-C  Cardiologist:  No primary care provider on file. Primary HF: DB  Chief Complaint: HF and AVR   History of Present Illness:  Travis Reese is a 73 year old with a history of HTN, tobacco abuse, aortic insufficiency s/p AVR/Bentall on 3/82/50, and diastolic heart failure. Pre-op cath with only 40% LAD lesion otherwise normal coronaries. He presents via [employer], [employer] (consulting) for a telehealth visit today.   Says he is doing great. Very active around the house. No swelling or SOB. BP well controlled. 120-130/70s. Takes lasix once a week or so. PCP recently checked blood work. Back to smoking a little bit.    He denies symptoms of cough, fevers, chills, or new SOB worrisome for COVID 19.    Past Medical History:  Diagnosis Date  . Anxiety   . Arthritis    knees, neck, shoulder  . BPH (benign prostatic hyperplasia)   . CHF (congestive heart failure) ([place])   . Elevated PSA    denies per patient  . HTN (hypertension)   . Hyperplasia of prostate with lower urinary tract symptoms (LUTS)   . Hypertension   . 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 Wears hearing aid    bilateral   Past Surgical History:  Procedure Laterality Date  . BENTALL PROCEDURE N/A 05/01/2018   Procedure: BIOLOGICAL BENTALL PROCEDURE AORTIC ROOT REPLACEMENT WITH 30MM VALSALVA GRAFT & 27MM INSPIRIS VALVE.;  Surgeon: Rexene Alberts, MD;  Location: [facility];  Service: Open Heart Surgery;  Laterality: N/A;  . CATARACT EXTRACTION  04/2018   . COLONOSCOPY  2007  . NASAL SINUS SURGERY  2006  . RIGHT/LEFT HEART CATH AND CORONARY ANGIOGRAPHY N/A 04/27/2018   Procedure: RIGHT/LEFT HEART CATH AND CORONARY ANGIOGRAPHY;  Surgeon: Jolaine Artist, MD;  Location: [place];  Service: Cardiovascular;  Laterality: N/A;  . TEE WITHOUT CARDIOVERSION N/A 04/27/2018   Procedure: TRANSESOPHAGEAL ECHOCARDIOGRAM (TEE);  Surgeon: Jolaine Artist, MD;  Location: [facility];  Service: Cardiovascular;  Laterality: N/A;  . TEE WITHOUT CARDIOVERSION N/A 05/01/2018   Procedure: TRANSESOPHAGEAL ECHOCARDIOGRAM (TEE);  Surgeon: Rexene Alberts, MD;  Location: [facility];  Service: Open Heart Surgery;  Laterality: N/A;  . TRANSANAL HEMORRHOIDAL DEARTERIALIZATION N/A 11/15/2015   Procedure: TRANSANAL HEMORRHOIDAL DEARTERIALIZATION;  Surgeon: Leighton Ruff, MD;  Location: [facility];  Service: General;  Laterality: N/A;     Current Outpatient Medications  Medication Sig Dispense Refill  . acetaminophen (TYLENOL) 500 MG tablet Take 500 mg by mouth 2 (two) times daily.    . Ascorbic Acid (VITAMIN C) 1000 MG tablet Take 1,000 mg by mouth daily.    Marland Kitchen aspirin EC 325 MG EC tablet Take 1 tablet (325 mg total) by mouth daily. 30 tablet 0  . BIOTIN PO Take 1 tablet by mouth daily.     Marland Kitchen CALCIUM PO Take 1 tablet by mouth daily.     . carvedilol (COREG) 25 MG tablet Take 1 tablet (25 mg total) by mouth 2 (two) times daily  with a meal. 60 tablet 1  . clonazePAM (KLONOPIN) 1 MG tablet Take 1 mg by mouth daily as needed for anxiety.     Marland Kitchen co-enzyme Q-10 30 MG capsule Take 30 mg by mouth 2 (two) times daily.    . furosemide (LASIX) 40 MG tablet Take 1 tablet (40 mg total) by mouth daily. For 2 days, then take as needed for 3 lb weight gain 30 tablet 3  . montelukast (SINGULAIR) 10 MG tablet Take 10 mg daily by mouth.    Marland Kitchen omeprazole (PRILOSEC) 20 MG capsule Take 20 mg by mouth 2 (two) times daily before a meal.    . polyethylene glycol powder  (GLYCOLAX/MIRALAX) powder Take 17 g by mouth as needed for mild constipation.     . primidone (MYSOLINE) 50 MG tablet Take 1 tablet (50 mg total) by mouth at bedtime. 90 tablet 1  . sodium chloride (OCEAN) 0.65 % SOLN nasal spray Place 1-2 sprays into both nostrils as needed for congestion.    Marland Kitchen spironolactone (ALDACTONE) 25 MG tablet TAKE 1/2 TABLET BY MOUTH DAILY 45 tablet 3  . tamsulosin (FLOMAX) 0.4 MG CAPS capsule Take 0.8 mg by mouth daily.     Marland Kitchen tiZANidine (ZANAFLEX) 4 MG tablet Take 4 mg by mouth at bedtime.      No current facility-administered medications for this encounter.     Allergies:   Patient has no known allergies.   Social History:  The patient  reports that he has been smoking cigarettes. He has a 25.00 pack-year smoking history. He has never used smokeless tobacco. He reports that he does not drink alcohol or use drugs.   Family History:  The patient's family history includes Breast cancer in his sister; COPD in his mother; Colon polyps in his father; Heart attack in his mother; Hypertension in his brother and sister; Parkinson's disease in his father; Prostate cancer in his father and paternal uncle.   ROS:  Please see the history of present illness.   All other systems are personally reviewed and negative.   Exam:  (Video/Tele Health Call; Exam is subjective and or/visual.) General:  Speaks in full sentences. No resp difficulty. Lungs: Normal respiratory effort with conversation.  Neuro: Alert & oriented x 3.   Recent Labs: 04/19/2018: B Natriuretic Peptide 1,549.5 04/30/2018: ALT 15 05/06/2018: Hemoglobin 8.7; Platelets 215 05/08/2018: Magnesium 1.9 05/15/2018: BUN 12; Creatinine, Ser 0.92; Potassium 3.8; Sodium 132  Personally reviewed   Wt Readings from Last 3 Encounters:  09/17/18 94.3 kg (208 lb)  06/08/18 92.4 kg (203 lb 9.6 oz)  05/18/18 93.4 kg (206 lb)    Other studies personally reviewed:   ASSESSMENT AND PLAN:  1. Aortic Insufficiency - 04/22/2018  ECHO EF 50-55% with AV severe regurgitation  - s/p AVR/bentall 8/30 - doing well - will have him f/i in 6 months with [facility] with echo - Reviewed need for SBE prophylaxis   2 .Chronic Diastolic HF.  - NYHA I Volume status stable.  - Much improved after AVR.  Continue lasix needed for 3 pound weight gain.  - Continue12.5 mg spiro daily.  3. Smoker/COPD - He is back to smoking - Encouraged cessatio  4. Mild nonobstructive CAD - LHC 04/27/18: Prox LAD to Mid LAD lesion is 40% stenosed. - LDL 53 8/28.  - On atorvastatin and asa. Lipids followed by PCP    COVID screen The patient does not have any symptoms that suggest any further testing/ screening at this time.  Social distancing reinforced today.  Recommended follow-up:  As above.   Relevant cardiac medications were reviewed at length with the patient today.   The patient does not have concerns regarding their medications at this time.   Today, I have spent 12 minutes with the patient with telehealth technology discussing above .    Signed, Glori Bickers, MD  12/08/2018 10:24 AM  [facility] [address] [facility] [place] [address] [phone] (office) [phone] (fax)

## 2018-12-09 NOTE — Telephone Encounter (Signed)
Called patient asking what his referral was for from Dr. Mahalia Longest. He reports he just needs a 6 month appt with Dr. Agustin Cree. Patient scheduled, no further questions

## 2018-12-10 ENCOUNTER — Encounter (HOSPITAL_COMMUNITY): Payer: Medicare Other | Admitting: Internal Medicine

## 2019-01-02 ENCOUNTER — Other Ambulatory Visit (HOSPITAL_COMMUNITY): Payer: Self-pay | Admitting: Internal Medicine

## 2019-01-19 NOTE — Progress Notes (Signed)
Virtual Visit via Video Note The purpose of this virtual visit is to provide medical care while limiting exposure to the novel coronavirus.    Consent was obtained for video visit:  Yes.   Answered questions that patient had about telehealth interaction:  Yes.   I discussed the limitations, risks, security and privacy concerns of performing an evaluation and management service by telemedicine. I also discussed with the patient that there may be a patient responsible charge related to this service. The patient expressed understanding and agreed to proceed.  Pt location: Home Physician Location: office Name of referring provider:  O'Buch, Greta, PA-C I connected with Travis Reese at patients initiation/request on 01/20/2019 at 10:45 AM EDT by video enabled telemedicine application and verified that I am speaking with the correct person using two identifiers. Pt MRN:  [id number] Pt DOB:  1946/07/17 Video Participants:  Travis Reese;     History of Present Illness:  Patient seen today in follow-up for his essential tremor as well as ulnar neuropathy.  He was on primidone, 50 mg daily.  He reports that he quit taking it because he was having trouble waking up in the AM and so he was eliminating his night time meds (also cut zanaflex in half at night; he also increased his klonopin).  He isn't noticing shaking.  Since our last visit, he did end up having an MRI of the cervical spine for radiating pain into the left hand and pinky finger.  While he had severe right C6 neural foraminal stenosis, this really did not explain his symptoms.  He ended up having an EMG on October 29 demonstrating at least a moderate degree of left ulnar neuropathy as well as a mild right ulnar neuropathy.  There was mild bilateral median neuropathy.  There was chronic C5 radiculopathy on the left.  He was already seeing orthopedics at [facility].  We sent the results to the orthopedic surgeon.  I have not heard back since.   The patient reports that he was put on gabapentin and "it did me no good so I quit taking it."  Pt states that he didn't want sx at this point.  States that the symptoms are actually better than they were.  His father died last week from [place].  He was in a nursing facility.  They just held the service.   Current Outpatient Medications on File Prior to Visit  Medication Sig Dispense Refill   Ascorbic Acid (VITAMIN C) 1000 MG tablet Take 1,000 mg by mouth daily.     aspirin EC 325 MG EC tablet Take 1 tablet (325 mg total) by mouth daily. 30 tablet 0   BIOTIN PO Take 1 tablet by mouth daily.      CALCIUM PO Take 1 tablet by mouth daily.      carvedilol (COREG) 25 MG tablet TAKE 1 TABLET BY MOUTH TWICE DAILY WITH A MEAL 60 tablet 1   clonazePAM (KLONOPIN) 1 MG tablet Take 1 mg by mouth daily as needed for anxiety.      co-enzyme Q-10 30 MG capsule Take 30 mg by mouth 2 (two) times daily.     furosemide (LASIX) 40 MG tablet Take 1 tablet (40 mg total) by mouth daily. For 2 days, then take as needed for 3 lb weight gain 30 tablet 3   montelukast (SINGULAIR) 10 MG tablet Take 10 mg daily by mouth.     omeprazole (PRILOSEC) 20 MG capsule Take 20 mg by mouth  2 (two) times daily before a meal.     polyethylene glycol powder (GLYCOLAX/MIRALAX) powder Take 17 g by mouth as needed for mild constipation.      primidone (MYSOLINE) 50 MG tablet Take 1 tablet (50 mg total) by mouth at bedtime. 90 tablet 1   sodium chloride (OCEAN) 0.65 % SOLN nasal spray Place 1-2 sprays into both nostrils as needed for congestion.     spironolactone (ALDACTONE) 25 MG tablet Take 0.5 tablets (12.5 mg total) by mouth daily. 45 tablet 3   tamsulosin (FLOMAX) 0.4 MG CAPS capsule Take 0.8 mg by mouth daily.      tiZANidine (ZANAFLEX) 4 MG tablet Take 4 mg by mouth at bedtime.      No current facility-administered medications on file prior to visit.      Observations/Objective:   Vitals:   01/20/19 1032  BP:  140/83  Pulse: 63  Weight: 205 lb (93 kg)  Height: 6\' 2"  (1.88 m)   GEN:  The patient appears stated age and is in NAD.  Neurological examination:  Orientation: The patient is alert and oriented x3. Cranial nerves: There is good facial symmetry. There is no facial hypomimia.  The speech is fluent and clear. Soft palate rises symmetrically and there is no tongue deviation. Hearing is intact to conversational tone. Motor: Strength is at least antigravity x 4.   Shoulder shrug is equal and symmetric.  There is no pronator drift.     Assessment and Plan:   1.  Essential Tremor.             -Now off of primidone and seems to be doing well.  He stopped the primidone when he was getting sleepy, but his clonazepam has been increased.  He also stopped tizanidine at the same time.  My suspicion is that the sleepiness was from other medications and not the primidone.  Nonetheless, he is doing well and will remain off the primidone.  2.  Left ulnar neuropathy  -Patient following with orthopedics.  We discussed his EMG findings in detail.  Discussed surgeries for transposition of the ulnar nerve.  Ultimately, the patient stated that he was actually doing better.  Discussed not leaning on the elbows.  -Patient has incidental findings on EMG of bilateral median neuropathy as well as a more chronic cervical radiculopathy (also noted on MRI).  Follow Up Instructions:  Follow-up as needed  -I discussed the assessment and treatment plan with the patient. The patient was provided an opportunity to ask questions and all were answered. The patient agreed with the plan and demonstrated an understanding of the instructions.   The patient was advised to call back or seek an in-person evaluation if the symptoms worsen or if the condition fails to improve as anticipated.    Total Time spent in visit with the patient was: 10 minutes   Wells Guiles Ebb Carelock, DO

## 2019-01-20 ENCOUNTER — Telehealth (INDEPENDENT_AMBULATORY_CARE_PROVIDER_SITE_OTHER): Payer: Medicare Other | Admitting: Neurology

## 2019-01-20 ENCOUNTER — Encounter: Payer: Self-pay | Admitting: Neurology

## 2019-01-20 ENCOUNTER — Telehealth: Payer: Self-pay

## 2019-01-20 ENCOUNTER — Other Ambulatory Visit: Payer: Self-pay

## 2019-01-20 DIAGNOSIS — G250 Essential tremor: Secondary | ICD-10-CM | POA: Diagnosis not present

## 2019-01-20 DIAGNOSIS — G5622 Lesion of ulnar nerve, left upper limb: Secondary | ICD-10-CM

## 2019-01-20 NOTE — Telephone Encounter (Signed)
ERROR

## 2019-02-08 ENCOUNTER — Other Ambulatory Visit: Payer: Self-pay | Admitting: Neurology

## 2019-02-08 NOTE — Telephone Encounter (Signed)
Requested Prescriptions   Pending Prescriptions Disp Refills  . primidone (MYSOLINE) 50 MG tablet [Pharmacy Med Name: PRIMIDONE  50MG   TAB] 90 tablet 1    Sig: TAKE 1 TABLET BY MOUTH AT  BEDTIME   Rx last filled:05/18/18 #90 1 refills  Pt last seen:01/20/19 Assessment and Plan:   1. Essential Tremor. -Now off of primidone and seems to be doing well.  He stopped the primidone when he was getting sleepy, but his clonazepam has been increased.  He also stopped tizanidine at the same time.  My suspicion is that the sleepiness was from other medications and not the primidone.  Nonetheless, he is doing well and will remain off the primidone  Follow up appt scheduled:02/16/19  RX DENIED PT NO LONGER TAKEN

## 2019-02-09 DIAGNOSIS — R5383 Other fatigue: Secondary | ICD-10-CM | POA: Diagnosis not present

## 2019-02-09 DIAGNOSIS — I358 Other nonrheumatic aortic valve disorders: Secondary | ICD-10-CM | POA: Diagnosis not present

## 2019-02-09 DIAGNOSIS — E559 Vitamin D deficiency, unspecified: Secondary | ICD-10-CM | POA: Diagnosis not present

## 2019-02-09 DIAGNOSIS — E785 Hyperlipidemia, unspecified: Secondary | ICD-10-CM | POA: Diagnosis not present

## 2019-02-09 DIAGNOSIS — Z79899 Other long term (current) drug therapy: Secondary | ICD-10-CM | POA: Diagnosis not present

## 2019-02-10 ENCOUNTER — Other Ambulatory Visit (HOSPITAL_COMMUNITY): Payer: Self-pay

## 2019-02-10 MED ORDER — SPIRONOLACTONE 25 MG PO TABS: 12.5000 mg | ORAL_TABLET | Freq: Every day | ORAL | 1 refills | Status: DC

## 2019-02-10 MED ORDER — CARVEDILOL 25 MG PO TABS: ORAL_TABLET | ORAL | 1 refills | Status: DC

## 2019-02-11 ENCOUNTER — Other Ambulatory Visit: Payer: Self-pay

## 2019-02-11 NOTE — Telephone Encounter (Signed)
Requested Prescriptions   Refused Prescriptions Disp Refills  . primidone (MYSOLINE) 50 MG tablet 90 tablet 1    Sig: Take 1 tablet (50 mg total) by mouth at bedtime.    Refused By: Ranae Plumber   Rx last filled:05/18/18 #90 1 refills  Pt last seen:01/20/19  Assessment and Plan:   1. Essential Tremor. -Now off of primidone and seems to be doing well.  He stopped the primidone when he was getting sleepy, but his clonazepam has been increased.  He also stopped tizanidine at the same time.  My suspicion is that the sleepiness was from other medications and not the primidone.  Nonetheless, he is doing well and will remain off the primidone. Follow up appt scheduled:02/16/19

## 2019-02-16 ENCOUNTER — Ambulatory Visit: Payer: Medicare Other | Admitting: Neurology

## 2019-03-15 DIAGNOSIS — D2239 Melanocytic nevi of other parts of face: Secondary | ICD-10-CM | POA: Diagnosis not present

## 2019-03-15 DIAGNOSIS — B351 Tinea unguium: Secondary | ICD-10-CM | POA: Diagnosis not present

## 2019-03-15 DIAGNOSIS — L728 Other follicular cysts of the skin and subcutaneous tissue: Secondary | ICD-10-CM | POA: Diagnosis not present

## 2019-03-15 DIAGNOSIS — B353 Tinea pedis: Secondary | ICD-10-CM | POA: Diagnosis not present

## 2019-03-25 DIAGNOSIS — G5620 Lesion of ulnar nerve, unspecified upper limb: Secondary | ICD-10-CM | POA: Diagnosis not present

## 2019-03-25 DIAGNOSIS — M4156 Other secondary scoliosis, lumbar region: Secondary | ICD-10-CM | POA: Diagnosis not present

## 2019-04-05 ENCOUNTER — Ambulatory Visit (INDEPENDENT_AMBULATORY_CARE_PROVIDER_SITE_OTHER): Payer: Medicare Other | Admitting: Thoracic Surgery (Cardiothoracic Vascular Surgery)

## 2019-04-05 ENCOUNTER — Encounter: Payer: Self-pay | Admitting: Thoracic Surgery (Cardiothoracic Vascular Surgery)

## 2019-04-05 ENCOUNTER — Other Ambulatory Visit: Payer: Self-pay

## 2019-04-05 VITALS — BP 179/97 | HR 65 | Temp 97.7°F | Resp 18 | Ht 74.0 in | Wt 198.4 lb

## 2019-04-05 DIAGNOSIS — Z953 Presence of xenogenic heart valve: Secondary | ICD-10-CM | POA: Diagnosis not present

## 2019-04-05 NOTE — Patient Instructions (Addendum)
Continue all previous medications without any changes at this time  Monitor your blood pressure regularly and discuss whether or not your blood pressure medications should be adjusted with your cardiologist and/or primary care physician, particularly if your systolic blood pressure consistently measures > 140 mmHg and/or your diastolic blood pressure measures > 90 mmHg  Keep your follow up office visit and echocardiogram at [facility] as scheduled  Endocarditis is a potentially serious infection of heart valves or inside lining of the heart.  It occurs more commonly in patients with diseased heart valves (such as patient's with aortic or mitral valve disease) and in patients who have undergone heart valve repair or replacement.  Certain surgical and dental procedures may put you at risk, such as dental cleaning, other dental procedures, or any surgery involving the respiratory, urinary, gastrointestinal tract, gallbladder or prostate gland.   To minimize your chances for develooping endocarditis, maintain good oral health and seek prompt medical attention for any infections involving the mouth, teeth, gums, skin or urinary tract.    Always notify your doctor or dentist about your underlying heart valve condition before having any invasive procedures. You will need to take antibiotics before certain procedures, including all routine dental cleanings or other dental procedures.  Your cardiologist or dentist should prescribe these antibiotics for you to be taken ahead of time.

## 2019-04-05 NOTE — Progress Notes (Signed)
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  He was last seen here in our office on September 17, 2018 at which time he was doing well.  Since then he has still not been seen in follow-up by his cardiologist and he has not had a follow-up echocardiogram since his surgery.  However, he tells me that he is scheduled to see Agustin Cree within the next week.  He reports that he is doing very well clinically.  He is quite active physically and he reports no limitations other than the fact that he has some problems with his lower legs.  He only gets short of breath if he really pushes himself physically and he is not at all limited by breathing overall he feels "much improved" in comparison with how he felt prior to surgery.  He does not experience any sort of chest pain or chest tightness.  He states that he checks his blood pressure 2 or 3 times a week.  He does not take his blood pressure medications this morning.   Current Outpatient Medications  Medication Sig Dispense Refill  . aspirin EC 325 MG EC tablet Take 1 tablet (325 mg total) by mouth daily. 30 tablet 0  . BIOTIN PO Take 1 tablet by mouth daily.     . carvedilol (COREG) 25 MG tablet TAKE 1 TABLET BY MOUTH TWICE DAILY WITH A MEAL 180 tablet 1  . clonazePAM (KLONOPIN) 1 MG tablet  Take 1 mg by mouth daily as needed for anxiety.     . DULoxetine (CYMBALTA) 60 MG capsule     . furosemide (LASIX) 40 MG tablet Take 1 tablet (40 mg total) by mouth daily. For 2 days, then take as needed for 3 lb weight gain 30 tablet 3  . montelukast (SINGULAIR) 10 MG tablet Take 10 mg daily by mouth.    . Multiple Vitamins-Minerals (MULTIVITAMIN ADULT) CHEW Chew 1 tablet by mouth.    . polyethylene glycol powder (GLYCOLAX/MIRALAX) powder Take 17 g by mouth as needed for mild constipation.     . primidone (MYSOLINE) 50 MG tablet Take 1 tablet (50 mg total) by mouth at bedtime. 90 tablet 1  . QUEtiapine (SEROQUEL) 50 MG tablet     . sodium chloride (OCEAN) 0.65 % SOLN nasal spray Place 1-2 sprays into both nostrils as needed for congestion.    Marland Kitchen spironolactone (ALDACTONE) 25 MG tablet Take 0.5 tablets (12.5 mg total) by mouth daily. 45 tablet 1  . tamsulosin (FLOMAX) 0.4 MG CAPS capsule Take 0.8 mg by mouth daily.     Marland Kitchen tiZANidine (ZANAFLEX) 4 MG tablet Take 4 mg by mouth at bedtime.     . Vitamin D, Ergocalciferol, (DRISDOL) 1.25 MG (50000 UT) CAPS capsule Take 50,000 Units by mouth every 7 (seven) days.    Marland Kitchen  vitamin E 100 UNIT capsule Take by mouth daily.     No current facility-administered medications for this visit.       Physical Exam:   BP (!) 179/97 (BP Location: Right Arm, Patient Position: Sitting, Cuff Size: Normal)   Pulse 65   Temp 97.7 F (36.5 C)   Resp 18   Ht 6\' 2"  (1.88 m)   Wt 198 lb 6.4 oz (90 kg)   SpO2 97% Comment: RA  BMI 25.47 kg/m   General:  Well-appearing  Chest:   Clear to auscultation  CV:   Regular rate and rhythm without murmur  Incisions:  Completely healed, sternum is stable  Abdomen:  Soft nontender  Extremities:  Warm and well-perfused  Diagnostic Tests:  n/a   Impression:  Patient appears to be doing very well approximately 1 year status post biological Bentall aortic root replacement using a stented bioprosthetic tissue valve and  synthetic root conduit.  The patient is hypertensive today but admits that he did not take his blood pressure medications this morning.  He has not had an echocardiogram performed since surgery but he is scheduled to see Dr. Agustin Cree within the next week.  Plan:  We have not recommended any changes to the patient's current medications.  I have suggested that the patient record his blood pressure every morning for the next few days so that he can bring the results for Dr. Agustin Cree to review at his next office visit.  I have reminded him that he needs a follow-up echocardiogram.  The patient has been reminded regarding the importance of dental hygiene and the lifelong need for antibiotic prophylaxis for all dental cleanings and other related invasive procedures.  In the future the patient will call and return to see [place] only should specific problems or questions arise.  I spent in excess of 10 minutes during the conduct of this office consultation and >50% of this time involved direct face-to-face encounter with the patient for counseling and/or coordination of their care.    Valentina Gu. Roxy Manns, MD 04/05/2019 12:30 PM

## 2019-04-06 ENCOUNTER — Other Ambulatory Visit: Payer: Medicare Other

## 2019-04-08 ENCOUNTER — Ambulatory Visit (INDEPENDENT_AMBULATORY_CARE_PROVIDER_SITE_OTHER): Payer: Medicare Other

## 2019-04-08 ENCOUNTER — Ambulatory Visit (INDEPENDENT_AMBULATORY_CARE_PROVIDER_SITE_OTHER): Payer: Medicare Other | Admitting: Cardiology

## 2019-04-08 ENCOUNTER — Encounter: Payer: Self-pay | Admitting: Cardiology

## 2019-04-08 ENCOUNTER — Other Ambulatory Visit: Payer: Self-pay

## 2019-04-08 VITALS — BP 140/80 | HR 57 | Ht 74.0 in | Wt 201.0 lb

## 2019-04-08 DIAGNOSIS — Z953 Presence of xenogenic heart valve: Secondary | ICD-10-CM

## 2019-04-08 DIAGNOSIS — E782 Mixed hyperlipidemia: Secondary | ICD-10-CM

## 2019-04-08 DIAGNOSIS — I5032 Chronic diastolic (congestive) heart failure: Secondary | ICD-10-CM

## 2019-04-08 DIAGNOSIS — I10 Essential (primary) hypertension: Secondary | ICD-10-CM

## 2019-04-08 LAB — ECHOCARDIOGRAM COMPLETE
Height: 74 in
Weight: 3216 oz

## 2019-04-08 NOTE — Progress Notes (Signed)
Cardiology Office Note:    Date:  04/08/2019   ID:  Travis Reese, DOB [birth year], MRN [id number]  PCP:  Janine Limbo, PA-C  Cardiologist:  Jenne Campus, MD    Referring MD: Janine Limbo, PA-C   Chief Complaint  Patient presents with  . Pre-op Exam  I need elbow surgery  History of Present Illness:    Travis Reese is a 73 y.o. 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  He comes today to my office to be established as a patient.  On top of that he required left elbow surgery which will be done under general anesthesia and he will like to be make sure that he is fine from that point of view.  Overall he is doing very well he is asymptomatic he can walk climb stairs he cut the grass with no difficulties feels feeling good.  There is no dizziness no passing out.  Past Medical History:  Diagnosis Date  . Anxiety   . Arthritis    knees, neck, shoulder  . BPH (benign prostatic hyperplasia)   . CHF (congestive heart failure) ([facility])   . Elevated PSA    denies per patient  . HTN (hypertension)   . Hyperplasia of prostate with lower urinary tract symptoms (LUTS)   . Hypertension   . 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 Wears hearing aid    bilateral    Past Surgical History:  Procedure Laterality Date  . BENTALL PROCEDURE N/A 05/01/2018   Procedure: BIOLOGICAL BENTALL PROCEDURE AORTIC ROOT REPLACEMENT WITH 30MM VALSALVA GRAFT & 27MM INSPIRIS VALVE.;  Surgeon: Rexene Alberts, MD;  Location: [facility];  Service: Open Heart Surgery;  Laterality: N/A;  . CATARACT EXTRACTION   04/2018  . COLONOSCOPY  2007  . NASAL SINUS SURGERY  2006  . RIGHT/LEFT HEART CATH AND CORONARY ANGIOGRAPHY N/A 04/27/2018   Procedure: RIGHT/LEFT HEART CATH AND CORONARY ANGIOGRAPHY;  Surgeon: Jolaine Artist, MD;  Location: [facility];  Service: Cardiovascular;  Laterality: N/A;  . TEE WITHOUT CARDIOVERSION N/A 04/27/2018   Procedure: TRANSESOPHAGEAL ECHOCARDIOGRAM (TEE);  Surgeon: Jolaine Artist, MD;  Location[facility];  Service: Cardiovascular;  Laterality: N/A;  . TEE WITHOUT CARDIOVERSION N/A 05/01/2018   Procedure: TRANSESOPHAGEAL ECHOCARDIOGRAM (TEE);  Surgeon: Rexene Alberts, MD;  Location: [facility];  Service: Open Heart Surgery;  Laterality: N/A;  . TRANSANAL HEMORRHOIDAL DEARTERIALIZATION N/A 11/15/2015   Procedure: TRANSANAL HEMORRHOIDAL DEARTERIALIZATION;  Surgeon: Leighton Ruff, MD;  Location: [facility];  Service: General;  Laterality: N/A;    Current Medications: Current Meds  Medication Sig  . albuterol (PROAIR HFA) 108 (90 Base) MCG/ACT inhaler Inhale 2 puffs into the lungs every 6 (six) hours as needed for wheezing or shortness of breath.  Marland Kitchen aspirin EC 325 MG EC tablet Take 1 tablet (325 mg total) by mouth daily.  Marland Kitchen BIOTIN PO Take 1 tablet by mouth daily.   . carvedilol (COREG) 25 MG tablet TAKE 1 TABLET BY MOUTH TWICE DAILY WITH A MEAL  . clonazePAM (KLONOPIN) 1 MG tablet Take 1 mg by mouth daily as needed for anxiety.   . DULoxetine (CYMBALTA) 60  MG capsule Take 60 mg by mouth daily.   . furosemide (LASIX) 40 MG tablet Take 1 tablet (40 mg total) by mouth daily. For 2 days, then take as needed for 3 lb weight gain (Patient taking differently: Take 40 mg by mouth as needed. )  . montelukast (SINGULAIR) 10 MG tablet Take 10 mg daily by mouth.  . Multiple Vitamins-Minerals (MULTIVITAMIN ADULT) CHEW Chew 1 tablet by mouth.  . polyethylene glycol powder (GLYCOLAX/MIRALAX) powder Take 17 g by mouth as needed for mild constipation.   .  primidone (MYSOLINE) 50 MG tablet Take 1 tablet (50 mg total) by mouth at bedtime.  Marland Kitchen QUEtiapine (SEROQUEL) 50 MG tablet Take 25 mg by mouth at bedtime.   . sodium chloride (OCEAN) 0.65 % SOLN nasal spray Place 1-2 sprays into both nostrils as needed for congestion.  Marland Kitchen spironolactone (ALDACTONE) 25 MG tablet Take 0.5 tablets (12.5 mg total) by mouth daily.  . tamsulosin (FLOMAX) 0.4 MG CAPS capsule Take 0.8 mg by mouth daily.   Marland Kitchen tiZANidine (ZANAFLEX) 4 MG tablet Take 4 mg by mouth at bedtime.   . Vitamin D, Ergocalciferol, (DRISDOL) 1.25 MG (50000 UT) CAPS capsule Take 50,000 Units by mouth every 7 (seven) days.  . vitamin E 100 UNIT capsule Take by mouth daily.     Allergies:   Patient has no known allergies.   Social History   Socioeconomic History  . Marital status: Single    Spouse name: Not on file  . Number of children: 2  . Years of education: Not on file  . Highest education level: Not on file  Occupational History  . Occupation: retired    Comment: Presenter, broadcasting  Social Needs  . Financial resource strain: Not on file  . Food insecurity    Worry: Not on file    Inability: Not on file  . Transportation needs    Medical: Not on file    Non-medical: Not on file  Tobacco Use  . Smoking status: Current Every Day Smoker    Packs/day: 0.50    Years: 50.00    Pack years: 25.00    Types: Cigarettes  . Smokeless tobacco: Never Used  Substance and Sexual Activity  . Alcohol use: No    Alcohol/week: 0.0 standard drinks  . Drug use: No  . Sexual activity: Not Currently  Lifestyle  . Physical activity    Days per week: Not on file    Minutes per session: Not on file  . Stress: Not on file  Relationships  . Social Herbalist on phone: Not on file    Gets together: Not on file    Attends religious service: Not on file    Active member of club or organization: Not on file    Attends meetings of clubs or organizations: Not on file    Relationship status: Not  on file  Other Topics Concern  . Not on file  Social History Narrative   Divorced lives with father after incarceration x 5 yrs   1 son, 1 daughter   Volunteer work   3 caffeine/day   04/19/2015     Family History: The patient's family history includes Breast cancer in his sister; COPD in his mother; Colon polyps in his father; Heart attack in his mother; Hypertension in his brother and sister; Parkinson's disease in his father; Prostate cancer in his father and paternal uncle. ROS:   Please see the history of present illness.  All 14 point review of systems negative except as described per history of present illness  EKGs/Labs/Other Studies Reviewed:    EKG done today shows sinus bradycardia rate 57.  Right bundle branch block with left anterior fascicular block.  Recent Labs: 04/19/2018: B Natriuretic Peptide 1,549.5 04/30/2018: ALT 15 05/06/2018: Hemoglobin 8.7; Platelets 215 05/08/2018: Magnesium 1.9 05/15/2018: BUN 12; Creatinine, Ser 0.92; Potassium 3.8; Sodium 132  Recent Lipid Panel    Component Value Date/Time   CHOL 110 04/29/2018 0500   TRIG 29 04/29/2018 0500   HDL 51 04/29/2018 0500   CHOLHDL 2.2 04/29/2018 0500   VLDL 6 04/29/2018 0500   LDLCALC 53 04/29/2018 0500    Physical Exam:    VS:  BP 140/80   Pulse (!) 57   Ht 6\' 2"  (1.88 m)   Wt 201 lb (91.2 kg)   SpO2 99%   BMI 25.81 kg/m     Wt Readings from Last 3 Encounters:  04/08/19 201 lb (91.2 kg)  04/05/19 198 lb 6.4 oz (90 kg)  01/20/19 205 lb (93 kg)     GEN:  Well nourished, well developed in no acute distress HEENT: Normal NECK: No JVD; No carotid bruits LYMPHATICS: No lymphadenopathy CARDIAC: RRR, no murmurs, no rubs, no gallops RESPIRATORY:  Clear to auscultation without rales, wheezing or rhonchi  ABDOMEN: Soft, non-tender, non-distended MUSCULOSKELETAL:  No edema; No deformity  SKIN: Warm and dry LOWER EXTREMITIES: no swelling NEUROLOGIC:  Alert and oriented x 3 PSYCHIATRIC:  Normal  affect   ASSESSMENT:    1. S/P aortic valve replacement with bioprosthetic valve   2. Essential hypertension   3. Mixed hyperlipidemia    PLAN:    In order of problems listed above:  1. Status post aortic valve replacement with bioprosthetic valve.  Doing well sounds beautifully.  He is scheduled to have an echocardiogram done will await results of the test. 2. Essential hypertension blood pressure slightly elevated today I will ask him to check his blood pressure on a regular basis and let me know what his blood pressure is. 3. Mixed dyslipidemia he tells me that every single time he got his cholesterol check is excellent we will call his primary care physician to get a copy of it.   Medication Adjustments/Labs and Tests Ordered: Current medicines are reviewed at length with the patient today.  Concerns regarding medicines are outlined above.  No orders of the defined types were placed in this encounter.  Medication changes: No orders of the defined types were placed in this encounter.   Signed, Park Liter, MD, [facility] 04/08/2019 12:05 PM    [place]

## 2019-04-08 NOTE — Patient Instructions (Addendum)
Medication Instructions:  Your physician recommends that you continue on your current medications as directed. Please refer to the Current Medication list given to you today.  If you need a refill on your cardiac medications before your next appointment, please call your pharmacy.   Lab work: None.   If you have labs (blood work) drawn today and your tests are completely normal, you will receive your results only by: . MyChart Message (if you have MyChart) OR . A paper copy in the mail If you have any lab test that is abnormal or we need to change your treatment, we will call you to review the results.  Testing/Procedures: None.   Follow-Up: At [facility], you and your health needs are our priority.  As part of our continuing mission to provide you with exceptional heart care, we have created designated Provider Care Teams.  These Care Teams include your primary Cardiologist (physician) and Advanced Practice Providers (APPs -  Physician Assistants and Nurse Practitioners) who all work together to provide you with the care you need, when you need it. You will need a follow up appointment in 6 months.  Please call our office 2 months in advance to schedule this appointment.  You may see No primary care provider on file. or another member of our [facility] Provider Team in [place]: Brian Munley, MD . Rajan Revankar, MD  Any Other Special Instructions Will Be Listed Below (If Applicable).    

## 2019-04-08 NOTE — Progress Notes (Signed)
Complete echocardiogram has been performed.  Jimmy Nashea Chumney RDCS, RVT 

## 2019-04-16 DIAGNOSIS — M5116 Intervertebral disc disorders with radiculopathy, lumbar region: Secondary | ICD-10-CM | POA: Diagnosis not present

## 2019-04-16 DIAGNOSIS — M4156 Other secondary scoliosis, lumbar region: Secondary | ICD-10-CM | POA: Diagnosis not present

## 2019-04-28 DIAGNOSIS — Z139 Encounter for screening, unspecified: Secondary | ICD-10-CM | POA: Diagnosis not present

## 2019-04-28 DIAGNOSIS — Z9181 History of falling: Secondary | ICD-10-CM | POA: Diagnosis not present

## 2019-04-28 DIAGNOSIS — E785 Hyperlipidemia, unspecified: Secondary | ICD-10-CM | POA: Diagnosis not present

## 2019-04-28 DIAGNOSIS — Z0000 Encounter for general adult medical examination without abnormal findings: Secondary | ICD-10-CM | POA: Diagnosis not present

## 2019-05-13 DIAGNOSIS — Z79899 Other long term (current) drug therapy: Secondary | ICD-10-CM | POA: Diagnosis not present

## 2019-05-13 DIAGNOSIS — I509 Heart failure, unspecified: Secondary | ICD-10-CM | POA: Diagnosis not present

## 2019-05-13 DIAGNOSIS — E785 Hyperlipidemia, unspecified: Secondary | ICD-10-CM | POA: Diagnosis not present

## 2019-05-13 DIAGNOSIS — E559 Vitamin D deficiency, unspecified: Secondary | ICD-10-CM | POA: Diagnosis not present

## 2019-05-27 DIAGNOSIS — L720 Epidermal cyst: Secondary | ICD-10-CM | POA: Diagnosis not present

## 2019-06-07 ENCOUNTER — Other Ambulatory Visit (HOSPITAL_COMMUNITY): Payer: Self-pay | Admitting: Internal Medicine

## 2019-06-07 ENCOUNTER — Other Ambulatory Visit: Payer: Self-pay | Admitting: Neurology

## 2019-06-14 DIAGNOSIS — Z6824 Body mass index (BMI) 24.0-24.9, adult: Secondary | ICD-10-CM | POA: Diagnosis not present

## 2019-06-14 DIAGNOSIS — G4700 Insomnia, unspecified: Secondary | ICD-10-CM | POA: Diagnosis not present

## 2019-06-15 ENCOUNTER — Ambulatory Visit: Payer: Medicare Other | Admitting: Cardiology

## 2019-07-04 ENCOUNTER — Other Ambulatory Visit (HOSPITAL_COMMUNITY): Payer: Self-pay | Admitting: Internal Medicine

## 2019-07-16 ENCOUNTER — Other Ambulatory Visit: Payer: Self-pay | Admitting: Neurology

## 2019-07-19 ENCOUNTER — Other Ambulatory Visit: Payer: Self-pay | Admitting: Neurology

## 2019-07-19 ENCOUNTER — Telehealth: Payer: Self-pay | Admitting: Neurology

## 2019-07-19 NOTE — Telephone Encounter (Signed)
Per medication list he is no longer on this medication please advise

## 2019-07-19 NOTE — Telephone Encounter (Signed)
In May visit, he told me he went off of it.  We can work him in Friday at 11:15 if he would like

## 2019-07-19 NOTE — Telephone Encounter (Signed)
Patient called regarding his Primidone medication. He is needing a Refill. He said he has contacted his Pharmacy and they are telling him it cannot be refilled. He said it has been helping him. Please Advise. Thank you

## 2019-07-23 ENCOUNTER — Ambulatory Visit: Payer: Medicare Other | Admitting: Neurology

## 2019-08-04 DIAGNOSIS — M5116 Intervertebral disc disorders with radiculopathy, lumbar region: Secondary | ICD-10-CM | POA: Diagnosis not present

## 2019-08-17 DIAGNOSIS — M4150 Other secondary scoliosis, site unspecified: Secondary | ICD-10-CM | POA: Diagnosis not present

## 2019-08-17 DIAGNOSIS — M4716 Other spondylosis with myelopathy, lumbar region: Secondary | ICD-10-CM | POA: Diagnosis not present

## 2019-08-17 DIAGNOSIS — M545 Low back pain: Secondary | ICD-10-CM | POA: Diagnosis not present

## 2019-08-17 DIAGNOSIS — M549 Dorsalgia, unspecified: Secondary | ICD-10-CM | POA: Diagnosis not present

## 2019-08-19 ENCOUNTER — Other Ambulatory Visit: Payer: Self-pay | Admitting: Orthopaedic Surgery

## 2019-08-19 DIAGNOSIS — M4150 Other secondary scoliosis, site unspecified: Secondary | ICD-10-CM

## 2019-09-04 ENCOUNTER — Other Ambulatory Visit: Payer: Self-pay

## 2019-09-04 ENCOUNTER — Ambulatory Visit
Admission: RE | Admit: 2019-09-04 | Discharge: 2019-09-04 | Disposition: A | Discharge status: Home / Self Care | Payer: Medicare Other | Source: Ambulatory Visit | Attending: Orthopaedic Surgery | Admitting: Orthopaedic Surgery

## 2019-09-04 DIAGNOSIS — M48061 Spinal stenosis, lumbar region without neurogenic claudication: Secondary | ICD-10-CM | POA: Diagnosis not present

## 2019-09-04 DIAGNOSIS — M4150 Other secondary scoliosis, site unspecified: Secondary | ICD-10-CM

## 2019-09-09 DIAGNOSIS — M48062 Spinal stenosis, lumbar region with neurogenic claudication: Secondary | ICD-10-CM | POA: Diagnosis not present

## 2019-09-09 DIAGNOSIS — M4716 Other spondylosis with myelopathy, lumbar region: Secondary | ICD-10-CM | POA: Diagnosis not present

## 2019-09-09 DIAGNOSIS — M4150 Other secondary scoliosis, site unspecified: Secondary | ICD-10-CM | POA: Diagnosis not present

## 2019-09-09 DIAGNOSIS — M545 Low back pain: Secondary | ICD-10-CM | POA: Diagnosis not present

## 2019-09-14 DIAGNOSIS — M48062 Spinal stenosis, lumbar region with neurogenic claudication: Secondary | ICD-10-CM | POA: Diagnosis not present

## 2019-09-14 DIAGNOSIS — E785 Hyperlipidemia, unspecified: Secondary | ICD-10-CM | POA: Diagnosis not present

## 2019-09-14 DIAGNOSIS — M5030 Other cervical disc degeneration, unspecified cervical region: Secondary | ICD-10-CM | POA: Diagnosis not present

## 2019-09-14 DIAGNOSIS — Z79899 Other long term (current) drug therapy: Secondary | ICD-10-CM | POA: Diagnosis not present

## 2019-09-14 DIAGNOSIS — E559 Vitamin D deficiency, unspecified: Secondary | ICD-10-CM | POA: Diagnosis not present

## 2019-10-05 DIAGNOSIS — M48062 Spinal stenosis, lumbar region with neurogenic claudication: Secondary | ICD-10-CM | POA: Diagnosis not present

## 2019-10-12 DIAGNOSIS — M48062 Spinal stenosis, lumbar region with neurogenic claudication: Secondary | ICD-10-CM | POA: Diagnosis not present

## 2019-10-14 DIAGNOSIS — B351 Tinea unguium: Secondary | ICD-10-CM | POA: Diagnosis not present

## 2019-10-14 DIAGNOSIS — L853 Xerosis cutis: Secondary | ICD-10-CM | POA: Diagnosis not present

## 2019-11-01 ENCOUNTER — Ambulatory Visit: Payer: Medicare Other | Attending: Internal Medicine

## 2019-11-01 DIAGNOSIS — Z23 Encounter for immunization: Secondary | ICD-10-CM | POA: Insufficient documentation

## 2019-11-01 NOTE — Progress Notes (Signed)
   Covid-19 Vaccination Clinic  Name:  Travis Reese    MRN: [id number] DOB: [birth year]  11/01/2019  Mr. Travis Reese was observed post Covid-19 immunization for 15 minutes without incidence. He was provided with Vaccine Information Sheet and instruction to access the V-Safe system.   Mr. Travis Reese was instructed to call 911 with any severe reactions post vaccine: Marland Kitchen Difficulty breathing  . Swelling of your face and throat  . A fast heartbeat  . A bad rash all over your body  . Dizziness and weakness    Immunizations Administered    Name Date Dose VIS Date Route   Pfizer COVID-19 Vaccine 11/01/2019 11:29 AM 0.3 mL 08/13/2019 Intramuscular   Manufacturer: [place]   Lot: [id number]   [place]: [id number]

## 2019-11-02 DIAGNOSIS — M4150 Other secondary scoliosis, site unspecified: Secondary | ICD-10-CM | POA: Diagnosis not present

## 2019-11-02 DIAGNOSIS — M5136 Other intervertebral disc degeneration, lumbar region: Secondary | ICD-10-CM | POA: Diagnosis not present

## 2019-11-02 DIAGNOSIS — M48062 Spinal stenosis, lumbar region with neurogenic claudication: Secondary | ICD-10-CM | POA: Diagnosis not present

## 2019-11-25 DIAGNOSIS — J309 Allergic rhinitis, unspecified: Secondary | ICD-10-CM | POA: Diagnosis not present

## 2019-11-25 DIAGNOSIS — J069 Acute upper respiratory infection, unspecified: Secondary | ICD-10-CM | POA: Diagnosis not present

## 2019-11-30 ENCOUNTER — Ambulatory Visit: Payer: Medicare Other | Attending: Internal Medicine

## 2019-11-30 DIAGNOSIS — Z23 Encounter for immunization: Secondary | ICD-10-CM

## 2019-11-30 NOTE — Progress Notes (Signed)
   Covid-19 Vaccination Clinic  Name:  IZELL KISLER    MRN: [id number] DOB: 1946/08/24  11/30/2019  Mr. Binns was observed post Covid-19 immunization for 15 minutes without incident. He was provided with Vaccine Information Sheet and instruction to access the V-Safe system.   Mr. Spradley was instructed to call 911 with any severe reactions post vaccine: Marland Kitchen Difficulty breathing  . Swelling of face and throat  . A fast heartbeat  . A bad rash all over body  . Dizziness and weakness   Immunizations Administered    Name Date Dose VIS Date Route   Pfizer COVID-19 Vaccine 11/30/2019 12:54 PM 0.3 mL 08/13/2019 Intramuscular   Manufacturer: [place]   Lot: [id number]   [place]: [id number]

## 2019-12-14 DIAGNOSIS — M5136 Other intervertebral disc degeneration, lumbar region: Secondary | ICD-10-CM | POA: Diagnosis not present

## 2019-12-15 DIAGNOSIS — E559 Vitamin D deficiency, unspecified: Secondary | ICD-10-CM | POA: Diagnosis not present

## 2019-12-15 DIAGNOSIS — G4700 Insomnia, unspecified: Secondary | ICD-10-CM | POA: Diagnosis not present

## 2019-12-15 DIAGNOSIS — Z79899 Other long term (current) drug therapy: Secondary | ICD-10-CM | POA: Diagnosis not present

## 2019-12-15 DIAGNOSIS — E785 Hyperlipidemia, unspecified: Secondary | ICD-10-CM | POA: Diagnosis not present

## 2020-01-05 DIAGNOSIS — M4150 Other secondary scoliosis, site unspecified: Secondary | ICD-10-CM | POA: Diagnosis not present

## 2020-01-05 DIAGNOSIS — M5136 Other intervertebral disc degeneration, lumbar region: Secondary | ICD-10-CM | POA: Diagnosis not present

## 2020-01-05 DIAGNOSIS — M48062 Spinal stenosis, lumbar region with neurogenic claudication: Secondary | ICD-10-CM | POA: Diagnosis not present

## 2020-01-28 DIAGNOSIS — M5136 Other intervertebral disc degeneration, lumbar region: Secondary | ICD-10-CM | POA: Diagnosis not present

## 2020-01-28 DIAGNOSIS — M4150 Other secondary scoliosis, site unspecified: Secondary | ICD-10-CM | POA: Diagnosis not present

## 2020-01-28 DIAGNOSIS — M48062 Spinal stenosis, lumbar region with neurogenic claudication: Secondary | ICD-10-CM | POA: Diagnosis not present

## 2020-02-15 DIAGNOSIS — M48062 Spinal stenosis, lumbar region with neurogenic claudication: Secondary | ICD-10-CM | POA: Diagnosis not present

## 2020-02-24 DIAGNOSIS — L723 Sebaceous cyst: Secondary | ICD-10-CM | POA: Diagnosis not present

## 2020-02-24 DIAGNOSIS — L089 Local infection of the skin and subcutaneous tissue, unspecified: Secondary | ICD-10-CM | POA: Diagnosis not present

## 2020-03-15 DIAGNOSIS — Z79899 Other long term (current) drug therapy: Secondary | ICD-10-CM | POA: Diagnosis not present

## 2020-03-15 DIAGNOSIS — H532 Diplopia: Secondary | ICD-10-CM | POA: Diagnosis not present

## 2020-03-15 DIAGNOSIS — E559 Vitamin D deficiency, unspecified: Secondary | ICD-10-CM | POA: Diagnosis not present

## 2020-03-15 DIAGNOSIS — M5136 Other intervertebral disc degeneration, lumbar region: Secondary | ICD-10-CM | POA: Diagnosis not present

## 2020-03-15 DIAGNOSIS — E785 Hyperlipidemia, unspecified: Secondary | ICD-10-CM | POA: Diagnosis not present

## 2020-03-15 DIAGNOSIS — H5213 Myopia, bilateral: Secondary | ICD-10-CM | POA: Diagnosis not present

## 2020-03-15 DIAGNOSIS — M48062 Spinal stenosis, lumbar region with neurogenic claudication: Secondary | ICD-10-CM | POA: Diagnosis not present

## 2020-03-29 NOTE — Progress Notes (Signed)
[facility] Clinic Note - Initial Visit   Date: 03/30/20  BASHIR MARCHETTI MRN: [id number] DOB: 12/04/1945   Dear Dr Delman Cheadle:  Thank you for your kind referral of Travis Reese for consultation of diplopai. Although his history is well known to you, please allow [place] to reiterate it for the purpose of our medical record. The patient was accompanied to the clinic by self.    History of Present Illness: Travis Reese is a 74 y.o. male with hypertension, CHF, and tremor presenting for evaluation of double vision.  Starting in the spring 2021, he began having double vision when looking to the end-gaze right and left. He notices it more when he is tired. No worsening with temperature changes. He has not tried closing one eye to see if it improves.   He does not have droopy eyelids.  He occasionally has difficulty swallowing solids. He has not had falls. 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  He has some weakness and pain in the legs related to this.   He is followed here by my collegue, Dr. Carles Collet for essential tremor.  He has left ulnar neuropathy at the elbow which is managed conservatively.  Out-side paper records, electronic medical record, and images have been reviewed where available and summarized as:  NCS/EMG bilateral upper extremities 06/30/2018: 1. Left ulnar neuropathy with slowing across the elbow, demyelinating and axonal loss type, which is at least moderate in degree electrically. 2. Right ulnar neuropathy with slowing across the elbow, purely demyelinating type and mild in degree electrically. 3. Bilateral median neuropathy at or distal to the wrist, consistent with a clinical diagnosis of carpal tunnel syndrome, which is mild in degree electrically. 4. 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 5. Incidentally, there is a left Martin-Gruber anastomosis, normal variant.   Past Medical History:  Diagnosis  Date  . Anxiety   . Arthritis    knees, neck, shoulder  . BPH (benign prostatic hyperplasia)   . CHF (congestive heart failure) ([place])   . Elevated PSA    denies per patient  . HTN (hypertension)   . Hyperplasia of prostate with lower urinary tract symptoms (LUTS)   . Hypertension   . 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 Wears hearing aid    bilateral    Past Surgical History:  Procedure Laterality Date  . 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 CATARACT EXTRACTION  04/2018  . COLONOSCOPY  2007  . NASAL SINUS SURGERY  2006  . RIGHT/LEFT HEART CATH AND CORONARY ANGIOGRAPHY N/A 04/27/2018   Procedure: RIGHT/LEFT HEART CATH AND CORONARY ANGIOGRAPHY;  Surgeon: Jolaine Artist, MD;  Location: [place] [facility];  Service: Cardiovascular;  Laterality: N/A;  . TEE WITHOUT CARDIOVERSION N/A 04/27/2018   Procedure: TRANSESOPHAGEAL ECHOCARDIOGRAM (TEE);  Surgeon: Jolaine Artist, MD;  [facility] ENDOSCOPY;  Service: Cardiovascular;  Laterality: N/A;  . TEE WITHOUT CARDIOVERSION N/A 05/01/2018   Procedure: TRANSESOPHAGEAL ECHOCARDIOGRAM (TEE);  Surgeon: Rexene Alberts, MD;  Location: [place];  Service: Open Heart Surgery;  Laterality: N/A;  . TRANSANAL HEMORRHOIDAL DEARTERIALIZATION N/A 11/15/2015   Procedure: TRANSANAL HEMORRHOIDAL DEARTERIALIZATION;  Surgeon: Leighton Ruff, MD;  Location: [place];  Service: General;  Laterality: N/A;  Medications:  Outpatient Encounter Medications as of 03/30/2020  Medication Sig  . albuterol (PROAIR HFA) 108 (90 Base) MCG/ACT inhaler Inhale 2 puffs into the lungs every 6 (six) hours as needed for wheezing or shortness of breath.  . ARIPiprazole (ABILIFY) 5 MG tablet Take 2 tablets by mouth daily.  Marland Kitchen aspirin  EC 325 MG EC tablet Take 1 tablet (325 mg total) by mouth daily.  . carvedilol (COREG) 25 MG tablet TAKE 1 TABLET BY MOUTH  TWICE DAILY WITH MEALS  . clonazePAM (KLONOPIN) 1 MG tablet Take 1 mg by mouth daily as needed for anxiety.   . furosemide (LASIX) 40 MG tablet Take 1 tablet (40 mg total) by mouth daily. For 2 days, then take as needed for 3 lb weight gain (Patient taking differently: Take 40 mg by mouth as needed. )  . Krill Oil 1000 MG CAPS Take by mouth.  . montelukast (SINGULAIR) 10 MG tablet Take 10 mg daily by mouth.  . polyethylene glycol powder (GLYCOLAX/MIRALAX) powder Take 17 g by mouth as needed for mild constipation.   . pregabalin (LYRICA) 50 MG capsule Take 50 mg by mouth 3 (three) times daily.  . primidone (MYSOLINE) 50 MG tablet Take 1 tablet (50 mg total) by mouth at bedtime.  . sodium chloride (OCEAN) 0.65 % SOLN nasal spray Place 1-2 sprays into both nostrils as needed for congestion.  Marland Kitchen spironolactone (ALDACTONE) 25 MG tablet TAKE ONE-HALF TABLET BY  MOUTH DAILY  . tadalafil (CIALIS) 5 MG tablet Take 5 mg by mouth daily.  . tamsulosin (FLOMAX) 0.4 MG CAPS capsule Take 0.8 mg by mouth daily.   Marland Kitchen tiZANidine (ZANAFLEX) 4 MG tablet Take 4 mg by mouth at bedtime.   . Vitamin D, Ergocalciferol, (DRISDOL) 1.25 MG (50000 UT) CAPS capsule Take 50,000 Units by mouth every 7 (seven) days.  . vitamin E 100 UNIT capsule Take by mouth daily.  Marland Kitchen zinc gluconate 50 MG tablet Take 50 mg by mouth daily.  Marland Kitchen BIOTIN PO Take 1 tablet by mouth daily.  (Patient not taking: Reported on 03/30/2020)  . DULoxetine (CYMBALTA) 60 MG capsule Take 60 mg by mouth daily.  (Patient not taking: Reported on 03/30/2020)  . Multiple Vitamins-Minerals (MULTIVITAMIN ADULT) CHEW Chew 1 tablet by mouth. (Patient not taking: Reported on 03/30/2020)  . QUEtiapine (SEROQUEL) 50 MG tablet Take 25 mg by mouth at bedtime.  (Patient not taking: Reported on 03/30/2020)   No facility-administered encounter medications on  file as of 03/30/2020.    Allergies: No Known Allergies  Family History: Family History  Problem Relation Age of Onset  . COPD Mother   . Heart attack Mother   . Colon polyps Father   . Prostate cancer Father   . Parkinson's disease Father   . Hypertension Sister   . Hypertension Brother   . Breast cancer Sister   . Prostate cancer Paternal Uncle     Social History: Social History   Tobacco Use  . Smoking status: Current Every Day Smoker    Packs/day: 0.50    Years: 50.00    Pack years: 25.00    Types: Cigarettes  . Smokeless tobacco: Never Used  Vaping Use  . Vaping Use: Former  Substance Use Topics  . Alcohol use: No    Alcohol/week: 0.0 standard drinks  . Drug use: No   Social History   Social History Narrative   Divorced lives with father after incarceration x 5 yrs   1 son, 1 daughter  Volunteer work   3 caffeine/day   04/19/2015    Vital Signs:  BP (!) 144/92   Pulse 70   Ht 6\' 3"  (1.905 m)   Wt (!) 206 lb (93.4 kg)   SpO2 96%   BMI 25.75 kg/m    General Medical Exam:   General:  Well appearing, comfortable.   Eyes/ENT: see cranial nerve examination.   Neck:   No carotid bruits. Respiratory:  Clear to auscultation, good air entry bilaterally.   Cardiac:  Regular rate and rhythm, no murmur.   Extremities:  No edema Skin:  No rashes or lesions.  Neurological Exam: MENTAL STATUS including orientation to time, place, person, recent and remote memory, attention span and concentration, language, and fund of knowledge is normal.  Speech is slow, not dysarthric.  CRANIAL NERVES: II:  No visual field defects.  III-IV-VI: Pupils equal round and reactive to light.  Normal conjugate, extra-ocular eye movements in all directions of gaze, except mild restriction with upgaze on the right.  No nystagmus.  Mild R ptosis, at rest without fatigability.   V:  Normal facial sensation.    VII:  Normal facial symmetry and movements.  Frontalis, orbicularis oris,  orbicularis oculi is 5/5 VIII:  Normal hearing and vestibular function.   IX-X:  Normal palatal movement.   XI:  Normal shoulder shrug and head rotation.   XII:  Normal tongue strength   MOTOR:  No atrophy, fasciculations or abnormal movements.  No pronator drift.  No muscle fatigability Upper Extremity:  Right  Left  Deltoid  5/5   5/5   Biceps  5/5   5/5   Triceps  5/5   5/5   Infraspinatus 5/5  5/5  Medial pectoralis 5/5  5/5  Wrist extensors  5/5   5/5   Wrist flexors  5/5   5/5   Finger extensors  5/5   5/5   Finger flexors  5/5   5/5   Dorsal interossei  5/5   5/5   Abductor pollicis  5/5   5/5   Tone (Ashworth scale)  0  0   Lower Extremity:  Right  Left  Hip flexors  5/5   5/5   Hip extensors  5/5   5/5   Adductor 5/5  5/5  Abductor 5/5  5/5  Knee flexors  5/5   5/5   Knee extensors  5/5   5/5   Dorsiflexors  5/5   5/5   Plantarflexors  5/5   5/5   Toe extensors  5/5   5/5   Toe flexors  5/5   5/5   Tone (Ashworth scale)  0  0   MSRs:  Right        Left                  brachioradialis 2+  2+  biceps 2+  2+  triceps 2+  2+  patellar 2+  2+  ankle jerk 1+  1+  Hoffman no  no  plantar response down  down   SENSORY:  Normal and symmetric perception of temperature and vibration  COORDINATION/GAIT: Gait is mildly wide-based, antalgic, slightly stooped.  He was able to walk unassisted.   IMPRESSION: Diplopia with mild right ptosis.  No other focal weakness is noted on exam.  Differential includes ocular MG vs decompensated phoria  - Check AChR antibodies and TSH  - Discussed additional testing of single fiber EMG going forward, if symptoms get worse.  Given purely ocular symptoms, NCS/EMG would be of low value.   Further recommendations pending results.   Thank you for allowing me to participate in patient's care.  If I can answer any additional questions, I would be pleased to do so.    Sincerely,    Ceferino Lang K. Posey Pronto, DO

## 2020-03-30 ENCOUNTER — Encounter: Payer: Self-pay | Admitting: Neurology

## 2020-03-30 ENCOUNTER — Other Ambulatory Visit: Payer: Self-pay

## 2020-03-30 ENCOUNTER — Other Ambulatory Visit (INDEPENDENT_AMBULATORY_CARE_PROVIDER_SITE_OTHER): Payer: Medicare Other

## 2020-03-30 ENCOUNTER — Ambulatory Visit: Payer: Medicare Other | Admitting: Neurology

## 2020-03-30 VITALS — BP 144/92 | HR 70 | Ht 75.0 in | Wt 206.0 lb

## 2020-03-30 DIAGNOSIS — H532 Diplopia: Secondary | ICD-10-CM

## 2020-03-30 LAB — TSH: TSH: 2.17 u[IU]/mL (ref 0.35–4.50)

## 2020-03-30 NOTE — Patient Instructions (Addendum)
Check labs.  We will let you know the next step based on the results.

## 2020-04-03 DIAGNOSIS — K117 Disturbances of salivary secretion: Secondary | ICD-10-CM | POA: Diagnosis not present

## 2020-04-03 DIAGNOSIS — M48062 Spinal stenosis, lumbar region with neurogenic claudication: Secondary | ICD-10-CM | POA: Diagnosis not present

## 2020-04-03 DIAGNOSIS — R0981 Nasal congestion: Secondary | ICD-10-CM | POA: Diagnosis not present

## 2020-04-05 LAB — MYASTHENIA GRAVIS PANEL 2
A CHR BINDING ABS: <0.3 nmol/L
ACHR Blocking Abs: <15 % Inhibition (ref <15)
Acetylchol Modul Ab: 18 % Inhibition

## 2020-04-06 ENCOUNTER — Other Ambulatory Visit (HOSPITAL_COMMUNITY): Payer: Self-pay | Admitting: Surgery

## 2020-04-06 MED ORDER — CARVEDILOL 25 MG PO TABS: ORAL_TABLET | ORAL | 3 refills | Status: DC

## 2020-04-06 MED ORDER — SPIRONOLACTONE 25 MG PO TABS: 12.5000 mg | ORAL_TABLET | Freq: Every day | ORAL | 3 refills | Status: DC

## 2020-04-13 DIAGNOSIS — I10 Essential (primary) hypertension: Secondary | ICD-10-CM | POA: Diagnosis not present

## 2020-04-13 DIAGNOSIS — G4700 Insomnia, unspecified: Secondary | ICD-10-CM | POA: Diagnosis not present

## 2020-04-18 DIAGNOSIS — I10 Essential (primary) hypertension: Secondary | ICD-10-CM | POA: Diagnosis not present

## 2020-04-18 DIAGNOSIS — M5136 Other intervertebral disc degeneration, lumbar region: Secondary | ICD-10-CM | POA: Diagnosis not present

## 2020-04-18 DIAGNOSIS — M48062 Spinal stenosis, lumbar region with neurogenic claudication: Secondary | ICD-10-CM | POA: Diagnosis not present

## 2020-04-25 DIAGNOSIS — M48062 Spinal stenosis, lumbar region with neurogenic claudication: Secondary | ICD-10-CM | POA: Diagnosis not present

## 2020-05-01 ENCOUNTER — Other Ambulatory Visit (HOSPITAL_COMMUNITY): Payer: Self-pay | Admitting: Internal Medicine

## 2020-05-03 ENCOUNTER — Other Ambulatory Visit (HOSPITAL_COMMUNITY): Payer: Self-pay | Admitting: *Deleted

## 2020-05-03 MED ORDER — CARVEDILOL 25 MG PO TABS: ORAL_TABLET | ORAL | 3 refills | Status: DC

## 2020-05-03 MED ORDER — SPIRONOLACTONE 25 MG PO TABS
12.5000 mg | ORAL_TABLET | Freq: Every day | ORAL | 3 refills | Status: DC
Start: 2020-05-03 — End: 2020-11-28

## 2020-06-26 DIAGNOSIS — K5909 Other constipation: Secondary | ICD-10-CM | POA: Diagnosis not present

## 2020-06-26 DIAGNOSIS — K117 Disturbances of salivary secretion: Secondary | ICD-10-CM | POA: Diagnosis not present

## 2020-06-28 DIAGNOSIS — M4156 Other secondary scoliosis, lumbar region: Secondary | ICD-10-CM | POA: Diagnosis not present

## 2020-06-28 DIAGNOSIS — I10 Essential (primary) hypertension: Secondary | ICD-10-CM | POA: Diagnosis not present

## 2020-06-28 DIAGNOSIS — M48062 Spinal stenosis, lumbar region with neurogenic claudication: Secondary | ICD-10-CM | POA: Diagnosis not present

## 2020-07-12 DIAGNOSIS — M48062 Spinal stenosis, lumbar region with neurogenic claudication: Secondary | ICD-10-CM | POA: Diagnosis not present

## 2020-07-20 DIAGNOSIS — M419 Scoliosis, unspecified: Secondary | ICD-10-CM | POA: Diagnosis not present

## 2020-07-20 DIAGNOSIS — Z79899 Other long term (current) drug therapy: Secondary | ICD-10-CM | POA: Diagnosis not present

## 2020-07-20 DIAGNOSIS — E785 Hyperlipidemia, unspecified: Secondary | ICD-10-CM | POA: Diagnosis not present

## 2020-07-20 DIAGNOSIS — I509 Heart failure, unspecified: Secondary | ICD-10-CM | POA: Diagnosis not present

## 2020-07-20 DIAGNOSIS — E559 Vitamin D deficiency, unspecified: Secondary | ICD-10-CM | POA: Diagnosis not present

## 2020-07-24 DIAGNOSIS — J439 Emphysema, unspecified: Secondary | ICD-10-CM | POA: Diagnosis not present

## 2020-07-24 DIAGNOSIS — I700 Atherosclerosis of aorta: Secondary | ICD-10-CM | POA: Diagnosis not present

## 2020-07-24 DIAGNOSIS — J90 Pleural effusion, not elsewhere classified: Secondary | ICD-10-CM | POA: Diagnosis not present

## 2020-07-24 DIAGNOSIS — J9811 Atelectasis: Secondary | ICD-10-CM | POA: Diagnosis not present

## 2020-07-24 DIAGNOSIS — I77810 Thoracic aortic ectasia: Secondary | ICD-10-CM | POA: Diagnosis not present

## 2020-08-02 DIAGNOSIS — Z0000 Encounter for general adult medical examination without abnormal findings: Secondary | ICD-10-CM | POA: Diagnosis not present

## 2020-08-02 DIAGNOSIS — Z9181 History of falling: Secondary | ICD-10-CM | POA: Diagnosis not present

## 2020-08-02 DIAGNOSIS — E785 Hyperlipidemia, unspecified: Secondary | ICD-10-CM | POA: Diagnosis not present

## 2020-08-02 DIAGNOSIS — Z139 Encounter for screening, unspecified: Secondary | ICD-10-CM | POA: Diagnosis not present

## 2020-09-02 DIAGNOSIS — I6529 Occlusion and stenosis of unspecified carotid artery: Secondary | ICD-10-CM | POA: Insufficient documentation

## 2020-09-02 HISTORY — DX: Occlusion and stenosis of unspecified carotid artery: I65.29

## 2020-09-27 DIAGNOSIS — M48062 Spinal stenosis, lumbar region with neurogenic claudication: Secondary | ICD-10-CM | POA: Diagnosis not present

## 2020-09-27 DIAGNOSIS — M4156 Other secondary scoliosis, lumbar region: Secondary | ICD-10-CM | POA: Diagnosis not present

## 2020-10-18 DIAGNOSIS — E785 Hyperlipidemia, unspecified: Secondary | ICD-10-CM | POA: Diagnosis not present

## 2020-10-18 DIAGNOSIS — Z79899 Other long term (current) drug therapy: Secondary | ICD-10-CM | POA: Diagnosis not present

## 2020-10-18 DIAGNOSIS — R251 Tremor, unspecified: Secondary | ICD-10-CM | POA: Diagnosis not present

## 2020-10-18 DIAGNOSIS — E559 Vitamin D deficiency, unspecified: Secondary | ICD-10-CM | POA: Diagnosis not present

## 2020-10-18 DIAGNOSIS — I509 Heart failure, unspecified: Secondary | ICD-10-CM | POA: Diagnosis not present

## 2020-10-31 ENCOUNTER — Encounter: Payer: Self-pay | Admitting: Cardiology

## 2020-11-01 ENCOUNTER — Encounter: Payer: Self-pay | Admitting: Cardiology

## 2020-11-01 ENCOUNTER — Encounter: Payer: Self-pay | Admitting: *Deleted

## 2020-11-01 DIAGNOSIS — M48062 Spinal stenosis, lumbar region with neurogenic claudication: Secondary | ICD-10-CM | POA: Diagnosis not present

## 2020-11-01 NOTE — Telephone Encounter (Signed)
   [facility] Pre-operative Risk Assessment    Request for surgical clearance:  1. What type of surgery is being performed? Left Cubital Tunnel Release   2. When is this surgery scheduled? TBD   3. What type of clearance is required (medical clearance vs. Pharmacy clearance to hold med vs. Both)? Both  4. Are there any medications that need to be held prior to surgery and how long?Aspirin  5. Practice name and name of physician performing surgery? [facility], Dr. Creig Hines, MD   6. What is your office phone number [phone]    5.   What is your office fax number [phone]  8.   Anesthesia type (None, local, MAC, general) ? General   Travis Reese 11/01/2020, 2:51 PM  _________________________________________________________________   (provider comments below)

## 2020-11-01 NOTE — Telephone Encounter (Signed)
After reviewing schedule for Dr. Agustin Cree and APP's I did see an appt for 12/01/20. I am going to send message to scheduler to see if they can maybe squeeze the pt in sooner than 12/01/20 so that the pt's surgery is not delayed.

## 2020-11-01 NOTE — Telephone Encounter (Signed)
   Primary Cardiologist: Dr. Agustin Cree, MD   Chart reviewed as part of pre-operative protocol coverage. Because of Travis Reese's past medical history and time since last visit, he will require a follow-up visit in order to better assess preoperative cardiovascular risk.   Pre-op covering staff: The patient has not been seen since 2020. He has an upcoming appointment with Dr. Agustin Cree however could we see if this may be moved forward given the need for surgery. If not, please inform the team that he will need to have this appointment prior to proceeding. Please add pre-op to the appointment comments.    Kathyrn Drown, NP  11/01/2020, 3:55 PM

## 2020-11-02 NOTE — Telephone Encounter (Signed)
S/w Raye Sorrow CMA for Dr. Agustin Cree who states she s/w patient to try and get him in earlier and he had no idea about any surgery. He said he was not getting surgery for anything. It was in with a referral from the practice but I think it was old. Not sure why it was in with the referral but it turns out we don't need surgical clearance on for pt.   I will close the note and mark as erroneous.  This encounter was created in error - please disregard.

## 2020-11-06 ENCOUNTER — Other Ambulatory Visit: Payer: Self-pay | Admitting: Rehabilitation

## 2020-11-06 DIAGNOSIS — M48062 Spinal stenosis, lumbar region with neurogenic claudication: Secondary | ICD-10-CM

## 2020-11-28 ENCOUNTER — Other Ambulatory Visit (HOSPITAL_COMMUNITY): Payer: Self-pay | Admitting: Internal Medicine

## 2020-11-28 NOTE — Telephone Encounter (Signed)
Refill sent to pharmacy.   

## 2020-11-29 ENCOUNTER — Other Ambulatory Visit: Payer: Self-pay

## 2020-11-29 ENCOUNTER — Ambulatory Visit
Admission: RE | Admit: 2020-11-29 | Discharge: 2020-11-29 | Disposition: A | Discharge status: Home / Self Care | Payer: Medicare Other | Source: Ambulatory Visit | Attending: Rehabilitation | Admitting: Rehabilitation

## 2020-11-29 DIAGNOSIS — M48062 Spinal stenosis, lumbar region with neurogenic claudication: Secondary | ICD-10-CM | POA: Diagnosis not present

## 2020-11-29 DIAGNOSIS — M48061 Spinal stenosis, lumbar region without neurogenic claudication: Secondary | ICD-10-CM | POA: Diagnosis not present

## 2020-11-29 DIAGNOSIS — M4156 Other secondary scoliosis, lumbar region: Secondary | ICD-10-CM | POA: Diagnosis not present

## 2020-11-29 DIAGNOSIS — M542 Cervicalgia: Secondary | ICD-10-CM | POA: Diagnosis not present

## 2020-12-08 ENCOUNTER — Other Ambulatory Visit: Payer: Self-pay | Admitting: Orthopaedic Surgery

## 2020-12-08 DIAGNOSIS — M542 Cervicalgia: Secondary | ICD-10-CM

## 2020-12-14 ENCOUNTER — Emergency Department (HOSPITAL_COMMUNITY): Payer: Medicare Other

## 2020-12-14 ENCOUNTER — Encounter (HOSPITAL_COMMUNITY): Payer: Self-pay

## 2020-12-14 ENCOUNTER — Inpatient Hospital Stay (HOSPITAL_COMMUNITY)
Admission: EM | Admit: 2020-12-14 | Discharge: 2020-12-20 | DRG: 057 | Disposition: A | Discharge status: Skilled Nursing Facility | Payer: Medicare Other | Attending: Internal Medicine | Admitting: Internal Medicine

## 2020-12-14 ENCOUNTER — Other Ambulatory Visit: Payer: Self-pay

## 2020-12-14 DIAGNOSIS — Z20822 Contact with and (suspected) exposure to covid-19: Secondary | ICD-10-CM | POA: Diagnosis not present

## 2020-12-14 DIAGNOSIS — J449 Chronic obstructive pulmonary disease, unspecified: Secondary | ICD-10-CM | POA: Diagnosis not present

## 2020-12-14 DIAGNOSIS — M1990 Unspecified osteoarthritis, unspecified site: Secondary | ICD-10-CM | POA: Diagnosis present

## 2020-12-14 DIAGNOSIS — Z8249 Family history of ischemic heart disease and other diseases of the circulatory system: Secondary | ICD-10-CM | POA: Diagnosis not present

## 2020-12-14 DIAGNOSIS — I10 Essential (primary) hypertension: Secondary | ICD-10-CM | POA: Diagnosis not present

## 2020-12-14 DIAGNOSIS — M50222 Other cervical disc displacement at C5-C6 level: Secondary | ICD-10-CM | POA: Diagnosis not present

## 2020-12-14 DIAGNOSIS — Z7982 Long term (current) use of aspirin: Secondary | ICD-10-CM

## 2020-12-14 DIAGNOSIS — M5412 Radiculopathy, cervical region: Secondary | ICD-10-CM | POA: Diagnosis not present

## 2020-12-14 DIAGNOSIS — J309 Allergic rhinitis, unspecified: Secondary | ICD-10-CM | POA: Diagnosis not present

## 2020-12-14 DIAGNOSIS — G2119 Other drug induced secondary parkinsonism: Secondary | ICD-10-CM | POA: Diagnosis not present

## 2020-12-14 DIAGNOSIS — Z7401 Bed confinement status: Secondary | ICD-10-CM | POA: Diagnosis not present

## 2020-12-14 DIAGNOSIS — R531 Weakness: Secondary | ICD-10-CM

## 2020-12-14 DIAGNOSIS — M5010 Cervical disc disorder with radiculopathy, unspecified cervical region: Secondary | ICD-10-CM | POA: Diagnosis not present

## 2020-12-14 DIAGNOSIS — E538 Deficiency of other specified B group vitamins: Secondary | ICD-10-CM | POA: Diagnosis present

## 2020-12-14 DIAGNOSIS — M5116 Intervertebral disc disorders with radiculopathy, lumbar region: Secondary | ICD-10-CM | POA: Diagnosis not present

## 2020-12-14 DIAGNOSIS — Z79899 Other long term (current) drug therapy: Secondary | ICD-10-CM

## 2020-12-14 DIAGNOSIS — R29818 Other symptoms and signs involving the nervous system: Secondary | ICD-10-CM | POA: Diagnosis not present

## 2020-12-14 DIAGNOSIS — Z952 Presence of prosthetic heart valve: Secondary | ICD-10-CM

## 2020-12-14 DIAGNOSIS — G319 Degenerative disease of nervous system, unspecified: Secondary | ICD-10-CM | POA: Diagnosis not present

## 2020-12-14 DIAGNOSIS — G250 Essential tremor: Secondary | ICD-10-CM | POA: Diagnosis not present

## 2020-12-14 DIAGNOSIS — Z974 Presence of external hearing-aid: Secondary | ICD-10-CM | POA: Diagnosis not present

## 2020-12-14 DIAGNOSIS — R5381 Other malaise: Secondary | ICD-10-CM | POA: Diagnosis not present

## 2020-12-14 DIAGNOSIS — M546 Pain in thoracic spine: Secondary | ICD-10-CM | POA: Diagnosis not present

## 2020-12-14 DIAGNOSIS — M6281 Muscle weakness (generalized): Secondary | ICD-10-CM | POA: Diagnosis not present

## 2020-12-14 DIAGNOSIS — Z87898 Personal history of other specified conditions: Secondary | ICD-10-CM

## 2020-12-14 DIAGNOSIS — I358 Other nonrheumatic aortic valve disorders: Secondary | ICD-10-CM | POA: Diagnosis not present

## 2020-12-14 DIAGNOSIS — S0083XA Contusion of other part of head, initial encounter: Secondary | ICD-10-CM | POA: Diagnosis not present

## 2020-12-14 DIAGNOSIS — M419 Scoliosis, unspecified: Secondary | ICD-10-CM | POA: Diagnosis not present

## 2020-12-14 DIAGNOSIS — M6259 Muscle wasting and atrophy, not elsewhere classified, multiple sites: Secondary | ICD-10-CM | POA: Diagnosis not present

## 2020-12-14 DIAGNOSIS — N400 Enlarged prostate without lower urinary tract symptoms: Secondary | ICD-10-CM | POA: Diagnosis present

## 2020-12-14 DIAGNOSIS — Z95828 Presence of other vascular implants and grafts: Secondary | ICD-10-CM | POA: Diagnosis not present

## 2020-12-14 DIAGNOSIS — E559 Vitamin D deficiency, unspecified: Secondary | ICD-10-CM | POA: Diagnosis present

## 2020-12-14 DIAGNOSIS — M5021 Other cervical disc displacement,  high cervical region: Secondary | ICD-10-CM | POA: Diagnosis not present

## 2020-12-14 DIAGNOSIS — Z87891 Personal history of nicotine dependence: Secondary | ICD-10-CM

## 2020-12-14 DIAGNOSIS — R296 Repeated falls: Secondary | ICD-10-CM | POA: Diagnosis present

## 2020-12-14 DIAGNOSIS — M2550 Pain in unspecified joint: Secondary | ICD-10-CM | POA: Diagnosis not present

## 2020-12-14 DIAGNOSIS — E785 Hyperlipidemia, unspecified: Secondary | ICD-10-CM | POA: Diagnosis not present

## 2020-12-14 DIAGNOSIS — F319 Bipolar disorder, unspecified: Secondary | ICD-10-CM | POA: Diagnosis present

## 2020-12-14 DIAGNOSIS — J438 Other emphysema: Secondary | ICD-10-CM | POA: Diagnosis present

## 2020-12-14 DIAGNOSIS — Z953 Presence of xenogenic heart valve: Secondary | ICD-10-CM | POA: Diagnosis not present

## 2020-12-14 DIAGNOSIS — S199XXA Unspecified injury of neck, initial encounter: Secondary | ICD-10-CM | POA: Diagnosis not present

## 2020-12-14 DIAGNOSIS — G20 Parkinson's disease: Secondary | ICD-10-CM | POA: Diagnosis not present

## 2020-12-14 DIAGNOSIS — M545 Low back pain, unspecified: Secondary | ICD-10-CM | POA: Diagnosis not present

## 2020-12-14 DIAGNOSIS — M4805 Spinal stenosis, thoracolumbar region: Secondary | ICD-10-CM | POA: Diagnosis not present

## 2020-12-14 DIAGNOSIS — M5416 Radiculopathy, lumbar region: Secondary | ICD-10-CM | POA: Diagnosis not present

## 2020-12-14 DIAGNOSIS — Z83438 Family history of other disorder of lipoprotein metabolism and other lipidemia: Secondary | ICD-10-CM

## 2020-12-14 DIAGNOSIS — J439 Emphysema, unspecified: Secondary | ICD-10-CM | POA: Diagnosis not present

## 2020-12-14 DIAGNOSIS — R9082 White matter disease, unspecified: Secondary | ICD-10-CM | POA: Diagnosis not present

## 2020-12-14 DIAGNOSIS — R0689 Other abnormalities of breathing: Secondary | ICD-10-CM | POA: Diagnosis not present

## 2020-12-14 DIAGNOSIS — R251 Tremor, unspecified: Secondary | ICD-10-CM | POA: Diagnosis present

## 2020-12-14 DIAGNOSIS — R4781 Slurred speech: Secondary | ICD-10-CM | POA: Diagnosis not present

## 2020-12-14 DIAGNOSIS — I509 Heart failure, unspecified: Secondary | ICD-10-CM | POA: Diagnosis not present

## 2020-12-14 DIAGNOSIS — R1311 Dysphagia, oral phase: Secondary | ICD-10-CM | POA: Diagnosis not present

## 2020-12-14 DIAGNOSIS — I4510 Unspecified right bundle-branch block: Secondary | ICD-10-CM | POA: Diagnosis not present

## 2020-12-14 DIAGNOSIS — F419 Anxiety disorder, unspecified: Secondary | ICD-10-CM | POA: Diagnosis present

## 2020-12-14 DIAGNOSIS — Z9181 History of falling: Secondary | ICD-10-CM | POA: Diagnosis not present

## 2020-12-14 DIAGNOSIS — Z825 Family history of asthma and other chronic lower respiratory diseases: Secondary | ICD-10-CM | POA: Diagnosis not present

## 2020-12-14 DIAGNOSIS — Z743 Need for continuous supervision: Secondary | ICD-10-CM | POA: Diagnosis not present

## 2020-12-14 DIAGNOSIS — M50223 Other cervical disc displacement at C6-C7 level: Secondary | ICD-10-CM | POA: Diagnosis not present

## 2020-12-14 LAB — CBC
HCT: 41 % (ref 39.0–52.0)
Hemoglobin: 14 g/dL (ref 13.0–17.0)
MCH: 34.3 pg — ABNORMAL HIGH (ref 26.0–34.0)
MCHC: 34.1 g/dL (ref 30.0–36.0)
MCV: 100.5 fL — ABNORMAL HIGH (ref 80.0–100.0)
Platelets: 195 10*3/uL (ref 150–400)
RBC: 4.08 MIL/uL — ABNORMAL LOW (ref 4.22–5.81)
RDW: 12.2 % (ref 11.5–15.5)
WBC: 9.7 10*3/uL (ref 4.0–10.5)
nRBC: 0 % (ref 0.0–0.2)

## 2020-12-14 LAB — COMPREHENSIVE METABOLIC PANEL
ALT: 9 U/L (ref 0–44)
AST: 12 U/L — ABNORMAL LOW (ref 15–41)
Albumin: 3.1 g/dL — ABNORMAL LOW (ref 3.5–5.0)
Alkaline Phosphatase: 57 U/L (ref 38–126)
Anion gap: 10 (ref 5–15)
BUN: 13 mg/dL (ref 8–23)
CO2: 24 mmol/L (ref 22–32)
Calcium: 8.7 mg/dL — ABNORMAL LOW (ref 8.9–10.3)
Chloride: 103 mmol/L (ref 98–111)
Creatinine, Ser: 0.79 mg/dL (ref 0.61–1.24)
GFR, Estimated: >60 mL/min (ref >60)
Glucose, Bld: 98 mg/dL (ref 70–99)
Potassium: 4.2 mmol/L (ref 3.5–5.1)
Sodium: 137 mmol/L (ref 135–145)
Total Bilirubin: 0.8 mg/dL (ref 0.3–1.2)
Total Protein: 5.9 g/dL — ABNORMAL LOW (ref 6.5–8.1)

## 2020-12-14 LAB — URINALYSIS, ROUTINE W REFLEX MICROSCOPIC
Bilirubin Urine: NEGATIVE
Glucose, UA: NEGATIVE mg/dL
Hgb urine dipstick: NEGATIVE
Ketones, ur: NEGATIVE mg/dL
Leukocytes,Ua: NEGATIVE
Nitrite: NEGATIVE
Protein, ur: NEGATIVE mg/dL
Specific Gravity, Urine: 1.013 (ref 1.005–1.030)
pH: 6 (ref 5.0–8.0)

## 2020-12-14 LAB — RESP PANEL BY RT-PCR (FLU A&B, COVID) ARPGX2
Influenza A by PCR: NEGATIVE
Influenza B by PCR: NEGATIVE
SARS Coronavirus 2 by RT PCR: NEGATIVE

## 2020-12-14 NOTE — ED Notes (Addendum)
Pt transported to MRI 

## 2020-12-14 NOTE — ED Triage Notes (Signed)
Pt BIB [facility] after worsening of stroke-like symptoms. Symptoms originally started in Nov with progression noted 5 weeks ago after pt started new medication (GABA). Pt also fell 5 weeks, ago, no LOC. Pt VSS, A&Ox4, able to verbalize needs. No symptoms noted at this time.

## 2020-12-14 NOTE — ED Notes (Signed)
Patient transported to CT 

## 2020-12-14 NOTE — ED Notes (Signed)
Writer and MD stood pt up at bedside with assistance. Pt took a few small steps.

## 2020-12-14 NOTE — ED Provider Notes (Signed)
I assumed care in signout Plan is to follow-up on MRI C-spine and then reconsult neurology.    Ripley Fraise, MD 12/14/20 2330

## 2020-12-14 NOTE — ED Notes (Signed)
Pt provided with [place] sandwich and sprite.

## 2020-12-14 NOTE — ED Provider Notes (Signed)
[facility] EMERGENCY DEPARTMENT Provider Note   CSN: [id number] Arrival date & time: 12/14/20  1559     History Chief Complaint  Patient presents with  . Stroke like symptoms    PERLIE SCHEURING is a 75 y.o. male.  Patient presents with bilateral arm and leg weakness for the past 1-2 months. Symptoms gradual onset, constant, persistent, moderate, slowly worsening without abrupt/acute change today. No focal or unilateral numbness/weakness. Denies headache. No new or acute neck or back pain. Pt notes hx 'scoliosis'. Denies change in speech or vision. No numbness/weakness. Indicates unable to get around or attend to adls for past few weeks. Denies hx previous neurologic disease or chronic illness. Denies change in meds/new meds. No fever or chills. No abd pain or nvd. No gu c/o. No wt change.   The history is provided by the patient and the EMS personnel.       Past Medical History:  Diagnosis Date  . Allergic rhinosinusitis   . Anxiety   . Aortic valve sclerosis   . Arthritis    knees, neck, shoulder  . CHF (congestive heart failure) ([place])   . Cubital tunnel syndrome   . DDD (degenerative disc disease), cervical   . Depression   . Elevated PSA    denies per patient  . HTN (hypertension)   . Hyperlipidemia   . Hyperplasia of prostate with lower urinary tract symptoms (LUTS)   . Hypertension   . Mild dilation of ascending aorta ([facility])   . 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 Rectal prolapse   . Scoliosis of lumbosacral spine   . Tremor   . Vitamin D deficiency   . Wears hearing aid    bilateral    Patient Active Problem List   Diagnosis Date Noted  . S/P aortic valve replacement with bioprosthetic valve 05/01/2018  . HTN (hypertension) 04/19/2018  . Acute CHF (congestive heart failure) ([place]) 04/19/2018  . BPH (benign prostatic hyperplasia) 04/19/2018  . Anxiety 04/19/2018  . Cellulitis of face 10/15/2016  . Mixed hyperlipidemia  09/23/2016  . Other fatigue 07/23/2016  . Recurrent sinusitis 07/23/2016  . Other emphysema ([place]) 07/11/2016  . Restless leg syndrome 07/10/2016  . Plantar wart of left foot 01/05/2016  . Prolapsed internal hemorrhoids, grade 3 04/19/2015    Past Surgical History:  Procedure Laterality Date  . 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 BENTALL PROCEDURE N/A 05/01/2018   Procedure: BIOLOGICAL BENTALL PROCEDURE AORTIC ROOT REPLACEMENT WITH 30MM VALSALVA GRAFT & 27MM INSPIRIS VALVE.;  Surgeon: Rexene Alberts, MD;  Location: [facility];  Service: Open Heart Surgery;  Laterality: N/A;  . CATARACT EXTRACTION  04/2018  . COLONOSCOPY  2007  . NASAL SINUS SURGERY  2006  . RIGHT/LEFT HEART CATH AND CORONARY ANGIOGRAPHY N/A 04/27/2018   Procedure: RIGHT/LEFT HEART CATH AND CORONARY ANGIOGRAPHY;  Surgeon: Jolaine Artist, MD;  Location: [facility];  Service: Cardiovascular;  Laterality: N/A;  . TEE WITHOUT CARDIOVERSION N/A 04/27/2018   Procedure: TRANSESOPHAGEAL ECHOCARDIOGRAM (TEE);  Surgeon: Jolaine Artist, MD;  Location: [facility] ENDOS[facility];  Service: Cardiovascular;  Laterality: N/A;  . TEE WITHOUT CARDIOVERSION N/A 05/01/2018   Procedure: TRANSESOPHAGEAL ECHOCARDIOGRAM (TEE);  Surgeon: Rexene Alberts, MD;  Location: [facility];  Service: Open Heart Surgery;  Laterality: N/A;  . TRANSANAL HEMORRHOIDAL DEARTERIALIZATION N/A 11/15/2015   Procedure: TRANSANAL HEMORRHOIDAL DEARTERIALIZATION;  Surgeon: Leighton Ruff, MD;  Location: [facility];  Service: General;  Laterality: N/A;  Family History  Problem Relation Age of Onset  . COPD Mother   . Heart attack Mother   . Colon polyps Father   . Prostate cancer Father   . Parkinson's disease Father   . Hyperlipidemia Father   . Hypertension Sister   . Hypertension Brother   . Breast cancer Sister   . Prostate cancer Paternal Uncle     Social History   Tobacco Use  . Smoking status: Former Smoker    Packs/day: 0.50     Years: 50.00    Pack years: 25.00    Types: Cigarettes    Quit date: 05/2018    Years since quitting: 2.6  . Smokeless tobacco: Never Used  Vaping Use  . Vaping Use: Former  Substance Use Topics  . Alcohol use: Yes    Alcohol/week: 0.0 standard drinks    Comment: Occasional  . Drug use: No    Home Medications Prior to Admission medications   Medication Sig Start Date End Date Taking? Authorizing Provider  spironolactone (ALDACTONE) 25 MG tablet Take 0.5 tablets (12.5 mg total) by mouth daily. NEEDS AN APPOINTMENT FOR FURTHER REFILLS 11/28/20   Park Liter, MD  acetaminophen (TYLENOL) 500 MG tablet Take 500 mg by mouth every 8 (eight) hours as needed for mild pain.    [provider]  albuterol (VENTOLIN HFA) 108 (90 Base) MCG/ACT inhaler Inhale 2 puffs into the lungs every 6 (six) hours as needed for wheezing or shortness of breath.    [provider]  aspirin EC 325 MG EC tablet Take 1 tablet (325 mg total) by mouth daily. 05/08/18   Barrett, Erin R, PA-C  carvedilol (COREG) 25 MG tablet TAKE 1 TABLET BY MOUTH  TWICE DAILY WITH MEALS 05/03/20   Bensimhon, Shaune Pascal, MD  celecoxib (CELEBREX) 100 MG capsule Take 100 mg by mouth daily.    [provider]  clonazePAM (KLONOPIN) 1 MG tablet Take 1 mg by mouth daily as needed for anxiety.     [provider]  diclofenac (VOLTAREN) 75 MG EC tablet Take 75 mg by mouth 2 (two) times daily.    [provider]  furosemide (LASIX) 40 MG tablet Take 40 mg by mouth daily as needed for fluid (Take as needed for 3lb weight gain).    [provider]  Javier Docker Oil 1000 MG CAPS Take 1,000 mg by mouth daily.    [provider]  Melatonin 10 MG CAPS Take 10 mg by mouth at bedtime as needed (Sleep).    [provider]  montelukast (SINGULAIR) 10 MG tablet Take 10 mg by mouth at bedtime. 05/28/17   [provider]  pregabalin (LYRICA) 150 MG capsule Take 150 mg by mouth 2 (two)  times daily.    [provider]  primidone (MYSOLINE) 50 MG tablet Take 1 tablet (50 mg total) by mouth at bedtime. 05/18/18   Tat, Eustace Quail, DO  tadalafil (CIALIS) 5 MG tablet Take 5 mg by mouth daily.    [provider]  tamsulosin (FLOMAX) 0.4 MG CAPS capsule Take 0.8 mg by mouth daily after supper.    [provider]  tiZANidine (ZANAFLEX) 4 MG tablet Take 4 mg by mouth every 12 (twelve) hours as needed for muscle spasms. 05/16/17   [provider]  Vitamin D, Ergocalciferol, (DRISDOL) 1.25 MG (50000 UT) CAPS capsule Take 50,000 Units by mouth every 7 (seven) days.    [provider]    Allergies  Patient has no known allergies.  Review of Systems   Review of Systems  Constitutional: Negative for chills and fever.  HENT: Negative for sore throat.   Eyes: Negative for pain, redness and visual disturbance.  Respiratory: Negative for cough and shortness of breath.   Cardiovascular: Negative for chest pain and leg swelling.  Gastrointestinal: Negative for abdominal pain, diarrhea and vomiting.  Endocrine: Negative for polyuria.  Genitourinary: Negative for dysuria and flank pain.  Musculoskeletal: Negative for back pain and neck pain.  Skin: Negative for rash.  Neurological: Positive for weakness. Negative for speech difficulty, numbness and headaches.  Hematological: Does not bruise/bleed easily.  Psychiatric/Behavioral: Negative for agitation.    Physical Exam Updated Vital Signs Pulse 66   Resp 19   SpO2 96%   Physical Exam Vitals and nursing note reviewed.  Constitutional:      Appearance: Normal appearance. He is well-developed.  HENT:     Head: Atraumatic.     Nose: Nose normal.     Mouth/Throat:     Mouth: Mucous membranes are moist.     Pharynx: Oropharynx is clear.  Eyes:     General: No scleral icterus.    Conjunctiva/sclera: Conjunctivae normal.     Pupils: Pupils are equal, round, and reactive to light.  Neck:      Vascular: No carotid bruit.     Trachea: No tracheal deviation.     Comments: Thyroid not grossly enlarged or tender.  Cardiovascular:     Rate and Rhythm: Normal rate and regular rhythm.     Pulses: Normal pulses.     Heart sounds: Normal heart sounds. No murmur heard. No friction rub. No gallop.   Pulmonary:     Effort: Pulmonary effort is normal. No accessory muscle usage or respiratory distress.     Breath sounds: Normal breath sounds.  Abdominal:     General: Bowel sounds are normal. There is no distension.     Palpations: Abdomen is soft. There is no mass.     Tenderness: There is no abdominal tenderness. There is no guarding or rebound.     Hernia: No hernia is present.  Genitourinary:    Comments: No cva tenderness. Musculoskeletal:        General: No swelling or tenderness.     Cervical back: Normal range of motion and neck supple. No rigidity.     Right lower leg: No edema.     Left lower leg: No edema.  Skin:    General: Skin is warm and dry.     Findings: No rash.  Neurological:     Mental Status: He is alert.     Comments: Alert, speech clear. No dysarthria or aphasia. Motor intact bil, stre 4+ bilateral. Sens grossly intact. No pronator drift. +general muscular rigidity/stiffness. Brisk reflexes.   Psychiatric:        Mood and Affect: Mood normal.     ED Results / Procedures / Treatments   Labs (all labs ordered are listed, but only abnormal results are displayed) Results for orders placed or performed during the hospital encounter of 12/14/20  Resp Panel by RT-PCR (Flu A&B, Covid) Nasopharyngeal Swab   Specimen: Nasopharyngeal Swab; Nasopharyngeal(NP) swabs in vial transport medium  Result Value Ref Range   SARS Coronavirus 2 by RT PCR NEGATIVE NEGATIVE   Influenza A by PCR NEGATIVE NEGATIVE   Influenza B by PCR NEGATIVE NEGATIVE  Comprehensive metabolic panel  Result Value Ref Range   Sodium 137 135 -  145 mmol/L   Potassium 4.2 3.5 - 5.1 mmol/L    Chloride 103 98 - 111 mmol/L   CO2 24 22 - 32 mmol/L   Glucose, Bld 98 70 - 99 mg/dL   BUN 13 8 - 23 mg/dL   Creatinine, Ser 0.79 0.61 - 1.24 mg/dL   Calcium 8.7 (L) 8.9 - 10.3 mg/dL   Total Protein 5.9 (L) 6.5 - 8.1 g/dL   Albumin 3.1 (L) 3.5 - 5.0 g/dL   AST 12 (L) 15 - 41 U/L   ALT 9 0 - 44 U/L   Alkaline Phosphatase 57 38 - 126 U/L   Total Bilirubin 0.8 0.3 - 1.2 mg/dL   GFR, Estimated >60 >60 mL/min   Anion gap 10 5 - 15  CBC  Result Value Ref Range   WBC 9.7 4.0 - 10.5 K/uL   RBC 4.08 (L) 4.22 - 5.81 MIL/uL   Hemoglobin 14.0 13.0 - 17.0 g/dL   HCT 41.0 39.0 - 52.0 %   MCV 100.5 (H) 80.0 - 100.0 fL   MCH 34.3 (H) 26.0 - 34.0 pg   MCHC 34.1 30.0 - 36.0 g/dL   RDW 12.2 11.5 - 15.5 %   Platelets 195 150 - 400 K/uL   nRBC 0.0 0.0 - 0.2 %  Urinalysis, Routine w reflex microscopic Urine, Random  Result Value Ref Range   Color, Urine YELLOW YELLOW   APPearance CLEAR CLEAR   Specific Gravity, Urine 1.013 1.005 - 1.030   pH 6.0 5.0 - 8.0   Glucose, UA NEGATIVE NEGATIVE mg/dL   Hgb urine dipstick NEGATIVE NEGATIVE   Bilirubin Urine NEGATIVE NEGATIVE   Ketones, ur NEGATIVE NEGATIVE mg/dL   Protein, ur NEGATIVE NEGATIVE mg/dL   Nitrite NEGATIVE NEGATIVE   Leukocytes,Ua NEGATIVE NEGATIVE   MR LUMBAR SPINE WO CONTRAST  Result Date: 11/29/2020 CLINICAL DATA:  Neurogenic claudication EXAM: MRI LUMBAR SPINE WITHOUT CONTRAST TECHNIQUE: Multiplanar, multisequence MR imaging of the lumbar spine was performed. No intravenous contrast was administered. COMPARISON:  09/04/2019 FINDINGS: Segmentation:  Stand Alignment: Left convex scoliosis with apex at L3. Grade 1 retrolisthesis at L2-3. Vertebrae:  Heterogeneous bone marrow signal.  No acute abnormality. Conus medullaris and cauda equina: Conus extends to the L1-2 level. Conus and cauda equina appear normal. Paraspinal and other soft tissues: Unchanged cystic focus posterior to the right posterior elements. Disc levels: T12-L1: Normal disc  space and facets. No spinal canal or neuroforaminal stenosis. L1-L2: Right asymmetric disc bulge. Narrowing of the right lateral recess with mild central spinal canal stenosis. Moderate bilateral neural foraminal stenosis. L2-L3: Large right asymmetric disc bulge. Mild narrowing of the right lateral recess. Severe right and mild left foraminal stenosis. No central spinal canal stenosis. L3-L4: Large right asymmetric disc bulge. No spinal canal stenosis. Moderate right foraminal stenosis. L4-L5: Large disc bulge with severe facet arthrosis and widening of the right facet, unchanged. Moderate spinal canal stenosis. Unchanged severe right and mild left foraminal stenosis. L5-S1: Mild disc bulge. Moderate left foraminal stenosis. No spinal canal stenosis. Visualized sacrum: Normal. IMPRESSION: 1. Unchanged severe right L2-L3 and L4-L5 neural foraminal stenosis. 2. Moderate multilevel neural foraminal stenosis, unchanged Electronically Signed   By: Ulyses Jarred M.D.   On: 11/29/2020 23:34   DG Chest Port 1 View  Result Date: 12/14/2020 CLINICAL DATA:  Weakness. EXAM: PORTABLE CHEST 1 VIEW COMPARISON:  June 08, 2018. FINDINGS: Stable cardiomediastinal silhouette. Status post aortic root repair. No pneumothorax or pleural effusion is noted. Lungs are clear. Bony thorax  is unremarkable. IMPRESSION: No acute cardiopulmonary abnormality seen. Aortic Atherosclerosis (ICD10-I70.0). Electronically Signed   By: Marijo Conception M.D.   On: 12/14/2020 16:37    EKG EKG Interpretation  Date/Time:  Thursday December 14 2020 16:00:43 EDT Ventricular Rate:  66 PR Interval:  167 QRS Duration: 149 QT Interval:  436 QTC Calculation: 457 R Axis:   -69 Text Interpretation: Sinus rhythm RBBB and LAFB Confirmed by Lajean Saver [phone]) on 12/14/2020 4:22:03 PM   Radiology CT HEAD WO CONTRAST  Result Date: 12/14/2020 CLINICAL DATA:  Fall, pain EXAM: CT HEAD WITHOUT CONTRAST CT CERVICAL SPINE WITHOUT CONTRAST TECHNIQUE:  Multidetector CT imaging of the head and cervical spine was performed following the standard protocol without intravenous contrast. Multiplanar CT image reconstructions of the cervical spine were also generated. COMPARISON:  None. FINDINGS: CT HEAD FINDINGS Brain: No evidence of acute infarction, hemorrhage, hydrocephalus, extra-axial collection or mass lesion/mass effect. Periventricular and deep white matter hypodensity. Vascular: No hyperdense vessel or unexpected calcification. Skull: Normal. Negative for fracture or focal lesion. Sinuses/Orbits: No acute finding. Other: Soft tissue contusion of the right forehead (series 1, image 16). CT CERVICAL SPINE FINDINGS Alignment: Normal. Skull base and vertebrae: No acute fracture. No primary bone lesion or focal pathologic process. Soft tissues and spinal canal: No prevertebral fluid or swelling. No visible canal hematoma. Disc levels: Moderate multilevel disc space height loss and osteophytosis. Upper chest: Emphysema. Other: None. IMPRESSION: 1. No acute intracranial pathology. Small-vessel white matter disease. 2. Soft tissue contusion of the right forehead. 3. No fracture or static subluxation of the cervical spine. 4. Moderate multilevel disc space height loss and osteophytosis. 5. Emphysema in the included lung apices. Emphysema (ICD10-J43.9). Electronically Signed   By: Eddie Candle M.D.   On: 12/14/2020 17:07   CT CERVICAL SPINE WO CONTRAST  Result Date: 12/14/2020 CLINICAL DATA:  Fall, pain EXAM: CT HEAD WITHOUT CONTRAST CT CERVICAL SPINE WITHOUT CONTRAST TECHNIQUE: Multidetector CT imaging of the head and cervical spine was performed following the standard protocol without intravenous contrast. Multiplanar CT image reconstructions of the cervical spine were also generated. COMPARISON:  None. FINDINGS: CT HEAD FINDINGS Brain: No evidence of acute infarction, hemorrhage, hydrocephalus, extra-axial collection or mass lesion/mass effect. Periventricular and  deep white matter hypodensity. Vascular: No hyperdense vessel or unexpected calcification. Skull: Normal. Negative for fracture or focal lesion. Sinuses/Orbits: No acute finding. Other: Soft tissue contusion of the right forehead (series 1, image 16). CT CERVICAL SPINE FINDINGS Alignment: Normal. Skull base and vertebrae: No acute fracture. No primary bone lesion or focal pathologic process. Soft tissues and spinal canal: No prevertebral fluid or swelling. No visible canal hematoma. Disc levels: Moderate multilevel disc space height loss and osteophytosis. Upper chest: Emphysema. Other: None. IMPRESSION: 1. No acute intracranial pathology. Small-vessel white matter disease. 2. Soft tissue contusion of the right forehead. 3. No fracture or static subluxation of the cervical spine. 4. Moderate multilevel disc space height loss and osteophytosis. 5. Emphysema in the included lung apices. Emphysema (ICD10-J43.9). Electronically Signed   By: Eddie Candle M.D.   On: 12/14/2020 17:07   MR BRAIN WO CONTRAST  Result Date: 12/14/2020 CLINICAL DATA:  Acute neuro deficit EXAM: MRI HEAD WITHOUT CONTRAST TECHNIQUE: Multiplanar, multiecho pulse sequences of the brain and surrounding structures were obtained without intravenous contrast. COMPARISON:  CT head 12/14/2020 FINDINGS: Brain: Cluster of small areas of hyperintensity on diffusion-weighted imaging in the left parietal cortex and white matter. Possible acute or subacute infarct. No definite ADC correlate.  Generalized atrophy. Mild to moderate white matter changes with periventricular deep white matter hyperintensities bilaterally. Small chronic infarct left cerebellum. Negative for hemorrhage or mass. Vascular: Normal arterial flow voids. Skull and upper cervical spine: Negative Sinuses/Orbits: Paranasal sinuses clear. Bilateral cataract extraction Other: None IMPRESSION: Several small areas of diffusion hyperintensity in the left parietal white matter and cortex.  Possible acute or subacute infarct. Generalized atrophy. Mild to moderate chronic microvascular ischemic changes in the white matter. Electronically Signed   By: Franchot Gallo M.D.   On: 12/14/2020 20:57   DG Chest Port 1 View  Result Date: 12/14/2020 CLINICAL DATA:  Weakness. EXAM: PORTABLE CHEST 1 VIEW COMPARISON:  June 08, 2018. FINDINGS: Stable cardiomediastinal silhouette. Status post aortic root repair. No pneumothorax or pleural effusion is noted. Lungs are clear. Bony thorax is unremarkable. IMPRESSION: No acute cardiopulmonary abnormality seen. Aortic Atherosclerosis (ICD10-I70.0). Electronically Signed   By: Marijo Conception M.D.   On: 12/14/2020 16:37    Procedures Procedures   Medications Ordered in ED Medications - No data to display  ED Course  I have reviewed the triage vital signs and the nursing notes.  Pertinent labs & imaging results that were available during my care of the patient were reviewed by me and considered in my medical decision making (see chart for details).    MDM Rules/Calculators/A&P                         Iv ns. Continuous pulse ox and cardiac monitoring. Stat labs and imaging.   Reviewed nursing notes and prior charts for additional history.   Labs reviewed/interrpreted by me - wbc normal.   CT reviewed/interpreted by me -  No hem.   Will get MRIs - MRI pending.   MRI reviewed/interpreted by me - ? Left parietal lesions, ?subacute cva. Had intended to get brain and c spine, but only c spine mr ordered.   Neurology consulted - discussed pt, recommends adding mr cervical spine - Dr Stack/neurology also evaluated patient in ED, ?Parkinson's vs other progressive neurologist disease process. She requests call back when MRI done - she will look at MRI and advise on plan then.  Discussed plan with Dr Christy Gentles who accepts in sign out at [phone].            Final Clinical Impression(s) / ED Diagnoses Final diagnoses:  None    Rx / DC  Orders ED Discharge Orders    None       Lajean Saver, MD 12/14/20 2339

## 2020-12-15 ENCOUNTER — Encounter (HOSPITAL_COMMUNITY): Payer: Self-pay | Admitting: Internal Medicine

## 2020-12-15 ENCOUNTER — Observation Stay (HOSPITAL_COMMUNITY): Payer: Medicare Other

## 2020-12-15 DIAGNOSIS — Z87898 Personal history of other specified conditions: Secondary | ICD-10-CM | POA: Diagnosis not present

## 2020-12-15 DIAGNOSIS — M5412 Radiculopathy, cervical region: Secondary | ICD-10-CM

## 2020-12-15 DIAGNOSIS — R531 Weakness: Secondary | ICD-10-CM | POA: Insufficient documentation

## 2020-12-15 DIAGNOSIS — G20 Parkinson's disease: Secondary | ICD-10-CM

## 2020-12-15 DIAGNOSIS — M546 Pain in thoracic spine: Secondary | ICD-10-CM | POA: Diagnosis not present

## 2020-12-15 DIAGNOSIS — R29818 Other symptoms and signs involving the nervous system: Secondary | ICD-10-CM | POA: Diagnosis not present

## 2020-12-15 DIAGNOSIS — M5416 Radiculopathy, lumbar region: Secondary | ICD-10-CM | POA: Diagnosis not present

## 2020-12-15 DIAGNOSIS — M545 Low back pain, unspecified: Secondary | ICD-10-CM | POA: Diagnosis not present

## 2020-12-15 LAB — BASIC METABOLIC PANEL
Anion gap: 7 (ref 5–15)
BUN: 11 mg/dL (ref 8–23)
CO2: 26 mmol/L (ref 22–32)
Calcium: 9 mg/dL (ref 8.9–10.3)
Chloride: 102 mmol/L (ref 98–111)
Creatinine, Ser: 0.74 mg/dL (ref 0.61–1.24)
GFR, Estimated: >60 mL/min (ref >60)
Glucose, Bld: 103 mg/dL — ABNORMAL HIGH (ref 70–99)
Potassium: 3.8 mmol/L (ref 3.5–5.1)
Sodium: 135 mmol/L (ref 135–145)

## 2020-12-15 LAB — CBC
HCT: 40.6 % (ref 39.0–52.0)
Hemoglobin: 14 g/dL (ref 13.0–17.0)
MCH: 34.9 pg — ABNORMAL HIGH (ref 26.0–34.0)
MCHC: 34.5 g/dL (ref 30.0–36.0)
MCV: 101.2 fL — ABNORMAL HIGH (ref 80.0–100.0)
Platelets: 182 10*3/uL (ref 150–400)
RBC: 4.01 MIL/uL — ABNORMAL LOW (ref 4.22–5.81)
RDW: 12.4 % (ref 11.5–15.5)
WBC: 8.6 10*3/uL (ref 4.0–10.5)
nRBC: 0 % (ref 0.0–0.2)

## 2020-12-15 MED ORDER — ACETAMINOPHEN 650 MG RE SUPP: 650.0000 mg | Freq: Four times a day (QID) | RECTAL | Status: DC | PRN

## 2020-12-15 MED ORDER — MONTELUKAST SODIUM 10 MG PO TABS
10.0000 mg | ORAL_TABLET | Freq: Every day | ORAL | Status: DC
  Administered 2020-12-15 – 2020-12-19 (×6): 10 mg via ORAL
  Filled 2020-12-15 (×7): qty 1

## 2020-12-15 MED ORDER — CARVEDILOL 12.5 MG PO TABS
25.0000 mg | ORAL_TABLET | Freq: Two times a day (BID) | ORAL | Status: DC
  Administered 2020-12-15 – 2020-12-20 (×12): 25 mg via ORAL
  Filled 2020-12-15 (×12): qty 2

## 2020-12-15 MED ORDER — ALBUTEROL SULFATE HFA 108 (90 BASE) MCG/ACT IN AERS
2.0000 | INHALATION_SPRAY | Freq: Four times a day (QID) | RESPIRATORY_TRACT | Status: DC | PRN
  Administered 2020-12-20: 2 via RESPIRATORY_TRACT
  Filled 2020-12-15: qty 6.7

## 2020-12-15 MED ORDER — PRIMIDONE 50 MG PO TABS
50.0000 mg | ORAL_TABLET | Freq: Every day | ORAL | Status: DC
  Administered 2020-12-15 – 2020-12-19 (×6): 50 mg via ORAL
  Filled 2020-12-15 (×8): qty 1

## 2020-12-15 MED ORDER — MELATONIN 5 MG PO TABS
10.0000 mg | ORAL_TABLET | Freq: Every day | ORAL | Status: DC
  Administered 2020-12-15 – 2020-12-19 (×6): 10 mg via ORAL
  Filled 2020-12-15 (×7): qty 2

## 2020-12-15 MED ORDER — ACETAMINOPHEN 325 MG PO TABS
650.0000 mg | ORAL_TABLET | Freq: Four times a day (QID) | ORAL | Status: DC | PRN
  Administered 2020-12-16 – 2020-12-20 (×7): 650 mg via ORAL
  Filled 2020-12-15 (×7): qty 2

## 2020-12-15 MED ORDER — TAMSULOSIN HCL 0.4 MG PO CAPS
0.4000 mg | ORAL_CAPSULE | Freq: Every day | ORAL | Status: DC
  Administered 2020-12-15 – 2020-12-20 (×6): 0.4 mg via ORAL
  Filled 2020-12-15 (×6): qty 1

## 2020-12-15 MED ORDER — ASPIRIN EC 325 MG PO TBEC
325.0000 mg | DELAYED_RELEASE_TABLET | Freq: Every day | ORAL | Status: DC
  Administered 2020-12-15 – 2020-12-20 (×6): 325 mg via ORAL
  Filled 2020-12-15 (×7): qty 1

## 2020-12-15 MED ORDER — SPIRONOLACTONE 12.5 MG HALF TABLET
12.5000 mg | ORAL_TABLET | Freq: Every day | ORAL | Status: DC
  Administered 2020-12-15 – 2020-12-20 (×6): 12.5 mg via ORAL
  Filled 2020-12-15 (×6): qty 1

## 2020-12-15 MED ORDER — CLONAZEPAM 0.5 MG PO TABS
1.0000 mg | ORAL_TABLET | Freq: Every day | ORAL | Status: DC
  Administered 2020-12-15 – 2020-12-19 (×6): 1 mg via ORAL
  Filled 2020-12-15 (×6): qty 2

## 2020-12-15 NOTE — ED Notes (Signed)
Pt returned from MRI. Pt soiled self and bed. RN helped clean pt and placed new linen. Pt provided with sprite.

## 2020-12-15 NOTE — Progress Notes (Signed)
PROGRESS NOTE    GRAISON LEINBERGER  AST:[id number] DOB: 23-Oct-1945 DOA: 12/14/2020 PCP: Janine Limbo, PA-C    Brief Narrative:  Patient with history of aortic valve replacement, hypertension, BPH, tobacco abuse, lumbar myelopathy who has experienced worsening lower extremity weakness and difficulty ambulating over the past 5 weeks.  He has had multiple falls and is having a lot of difficulty functioning around his house.  MRI of spine has shown diffuse degeneration but no acute findings.  MRI of the brain shows no stroke.  The patient is noted to have significant hyperreflexia, tremors, bradykinesia concerning for possible Parkinson's or ALS.   Assessment & Plan:   Principal Problem:   Weakness Active Problems:   HTN (hypertension)   BPH (benign prostatic hyperplasia)   S/P aortic valve replacement with bioprosthetic valve   Other emphysema (HCC)  Weakness  Increasing weakness with features concerning for parkinsonism and also has increased reflexes -appreciate neurology consult.  Physical therapy consult.  Hypertension  Continue spironolactone.  Bioprosthetic aortic valve replacement  appears compensated.  BPH  Continue Flomax.  Lumbar and C-spine radiculopathy No significant changes  Emphysema  No acute decompensation noted    DVT prophylaxis: SCD/Compression stockings Code Status: Full code  Family Communication: Patient at bedside Disposition Plan: To be determined once PT eval is done however it is unclear how safe it will be for him to return home.  Patient remains inpatient due to unsafe discharge plan and continued work-up.  Consultants:   Neurology  Procedures:  None  Antimicrobials: Anti-infectives (From admission, onward)   None       Subjective: Feels well today.  He continues to be weak.  His bed is uncomfortable.  Objective: Vitals:   12/15/20 0800 12/15/20 0830 12/15/20 1100 12/15/20 1300  BP: (!) 127/96 109/78 110/85 115/75   Pulse: 72 75 68 75  Resp:  14 18 15   Temp:   98.1 F (36.7 C)   TempSrc:   Oral   SpO2: 95% 93% 94% 94%    Intake/Output Summary (Last 24 hours) at 12/15/2020 1627 Last data filed at 12/15/2020 0744 Gross per 24 hour  Intake --  Output 700 ml  Net -700 ml   There were no vitals filed for this visit.  Examination:  General exam: Appears calm and comfortable  Respiratory system: Clear to auscultation. Respiratory effort normal. Cardiovascular system: S1 & S2 heard, RRR.  Gastrointestinal system: Abdomen is nondistended, soft and nontender.  Central nervous system: Alert and oriented. No focal neurological deficits. Extremities: Symmetric  Skin: No rashes Psychiatry: Judgement and insight appear normal. Mood & affect appropriate.  Neurologic: Significant weakness in the lower extremities, mild upper extremity weakness    Data Reviewed: I have personally reviewed following labs and imaging studies  CBC: Recent Labs  Lab 12/14/20 1618 12/15/20 0745  WBC 9.7 8.6  HGB 14.0 14.0  HCT 41.0 40.6  MCV 100.5* 101.2*  PLT 195 989   Basic Metabolic Panel: Recent Labs  Lab 12/14/20 1618 12/15/20 0745  NA 137 135  K 4.2 3.8  CL 103 102  CO2 24 26  GLUCOSE 98 103*  BUN 13 11  CREATININE 0.79 0.74  CALCIUM 8.7* 9.0   GFR: CrCl cannot be calculated (Unknown ideal weight.). Liver Function Tests: Recent Labs  Lab 12/14/20 1618  AST 12*  ALT 9  ALKPHOS 57  BILITOT 0.8  PROT 5.9*  ALBUMIN 3.1*   Sepsis Labs:   Recent Results (from the past 240 hour(s))  Resp Panel by RT-PCR (Flu A&B, Covid) Nasopharyngeal Swab     Status: None   Collection Time: 12/14/20  5:49 PM   Specimen: Nasopharyngeal Swab; Nasopharyngeal(NP) swabs in vial transport medium  Result Value Ref Range Status   SARS Coronavirus 2 by RT PCR NEGATIVE NEGATIVE Final    Comment: (NOTE) SARS-CoV-2 target nucleic acids are NOT DETECTED.  The SARS-CoV-2 RNA is generally detectable in upper  respiratory specimens during the acute phase of infection. The lowest concentration of SARS-CoV-2 viral copies this assay can detect is 138 copies/mL. A negative result does not preclude SARS-Cov-2 infection and should not be used as the sole basis for treatment or other patient management decisions. A negative result may occur with  improper specimen collection/handling, submission of specimen other than nasopharyngeal swab, presence of viral mutation(s) within the areas targeted by this assay, and inadequate number of viral copies(<138 copies/mL). A negative result must be combined with clinical observations, patient history, and epidemiological information. The expected result is Negative.  Fact Sheet for Patients:  EntrepreneurPulse.com.au  Fact Sheet for Healthcare Providers:  IncredibleEmployment.be  This test is no t yet approved or cleared by the [place] FDA and  has been authorized for detection and/or diagnosis of SARS-CoV-2 by FDA under an Emergency Use Authorization (EUA). This EUA will remain  in effect (meaning this test can be used) for the duration of the COVID-19 declaration under Section 564(b)(1) of the Act, 21 U.S.C.section 360bbb-3(b)(1), unless the authorization is terminated  or revoked sooner.       Influenza A by PCR NEGATIVE NEGATIVE Final   Influenza B by PCR NEGATIVE NEGATIVE Final    Comment: (NOTE) The Xpert Xpress SARS-CoV-2/FLU/RSV plus assay is intended as an aid in the diagnosis of influenza from Nasopharyngeal swab specimens and should not be used as a sole basis for treatment. Nasal washings and aspirates are unacceptable for Xpert Xpress SARS-CoV-2/FLU/RSV testing.  Fact Sheet for Patients: EntrepreneurPulse.com.au  Fact Sheet for Healthcare Providers: IncredibleEmployment.be  This test is not yet approved or cleared by the [place] FDA and has been  authorized for detection and/or diagnosis of SARS-CoV-2 by FDA under an Emergency Use Authorization (EUA). This EUA will remain in effect (meaning this test can be used) for the duration of the COVID-19 declaration under Section 564(b)(1) of the Act, 21 U.S.C. section 360bbb-3(b)(1), unless the authorization is terminated or revoked.  Performed at [facility], [address], [place], [place] [address]       Radiology Studies: CT HEAD WO CONTRAST  Result Date: 12/14/2020 CLINICAL DATA:  Fall, pain EXAM: CT HEAD WITHOUT CONTRAST CT CERVICAL SPINE WITHOUT CONTRAST TECHNIQUE: Multidetector CT imaging of the head and cervical spine was performed following the standard protocol without intravenous contrast. Multiplanar CT image reconstructions of the cervical spine were also generated. COMPARISON:  None. FINDINGS: CT HEAD FINDINGS Brain: No evidence of acute infarction, hemorrhage, hydrocephalus, extra-axial collection or mass lesion/mass effect. Periventricular and deep white matter hypodensity. Vascular: No hyperdense vessel or unexpected calcification. Skull: Normal. Negative for fracture or focal lesion. Sinuses/Orbits: No acute finding. Other: Soft tissue contusion of the right forehead (series 1, image 16). CT CERVICAL SPINE FINDINGS Alignment: Normal. Skull base and vertebrae: No acute fracture. No primary bone lesion or focal pathologic process. Soft tissues and spinal canal: No prevertebral fluid or swelling. No visible canal hematoma. Disc levels: Moderate multilevel disc space height loss and osteophytosis. Upper chest: Emphysema. Other: None. IMPRESSION: 1. No acute intracranial pathology.  Small-vessel white matter disease. 2. Soft tissue contusion of the right forehead. 3. No fracture or static subluxation of the cervical spine. 4. Moderate multilevel disc space height loss and osteophytosis. 5. Emphysema in the included lung apices. Emphysema (ICD10-J43.9). Electronically Signed   By:  Eddie Candle M.D.   On: 12/14/2020 17:07   CT CERVICAL SPINE WO CONTRAST  Result Date: 12/14/2020 CLINICAL DATA:  Fall, pain EXAM: CT HEAD WITHOUT CONTRAST CT CERVICAL SPINE WITHOUT CONTRAST TECHNIQUE: Multidetector CT imaging of the head and cervical spine was performed following the standard protocol without intravenous contrast. Multiplanar CT image reconstructions of the cervical spine were also generated. COMPARISON:  None. FINDINGS: CT HEAD FINDINGS Brain: No evidence of acute infarction, hemorrhage, hydrocephalus, extra-axial collection or mass lesion/mass effect. Periventricular and deep white matter hypodensity. Vascular: No hyperdense vessel or unexpected calcification. Skull: Normal. Negative for fracture or focal lesion. Sinuses/Orbits: No acute finding. Other: Soft tissue contusion of the right forehead (series 1, image 16). CT CERVICAL SPINE FINDINGS Alignment: Normal. Skull base and vertebrae: No acute fracture. No primary bone lesion or focal pathologic process. Soft tissues and spinal canal: No prevertebral fluid or swelling. No visible canal hematoma. Disc levels: Moderate multilevel disc space height loss and osteophytosis. Upper chest: Emphysema. Other: None. IMPRESSION: 1. No acute intracranial pathology. Small-vessel white matter disease. 2. Soft tissue contusion of the right forehead. 3. No fracture or static subluxation of the cervical spine. 4. Moderate multilevel disc space height loss and osteophytosis. 5. Emphysema in the included lung apices. Emphysema (ICD10-J43.9). Electronically Signed   By: Eddie Candle M.D.   On: 12/14/2020 17:07   MR BRAIN WO CONTRAST  Result Date: 12/14/2020 CLINICAL DATA:  Acute neuro deficit EXAM: MRI HEAD WITHOUT CONTRAST TECHNIQUE: Multiplanar, multiecho pulse sequences of the brain and surrounding structures were obtained without intravenous contrast. COMPARISON:  CT head 12/14/2020 FINDINGS: Brain: Cluster of small areas of hyperintensity on  diffusion-weighted imaging in the left parietal cortex and white matter. Possible acute or subacute infarct. No definite ADC correlate. Generalized atrophy. Mild to moderate white matter changes with periventricular deep white matter hyperintensities bilaterally. Small chronic infarct left cerebellum. Negative for hemorrhage or mass. Vascular: Normal arterial flow voids. Skull and upper cervical spine: Negative Sinuses/Orbits: Paranasal sinuses clear. Bilateral cataract extraction Other: None IMPRESSION: Several small areas of diffusion hyperintensity in the left parietal white matter and cortex. Possible acute or subacute infarct. Generalized atrophy. Mild to moderate chronic microvascular ischemic changes in the white matter. Electronically Signed   By: Franchot Gallo M.D.   On: 12/14/2020 20:57   MR Cervical Spine Wo Contrast  Result Date: 12/15/2020 CLINICAL DATA:  Bilateral arm and leg weakness for 1-2 months EXAM: MRI CERVICAL SPINE WITHOUT CONTRAST TECHNIQUE: Multiplanar, multisequence MR imaging of the cervical spine was performed. No intravenous contrast was administered. COMPARISON:  MRI cervical spine 06/27/2018 and 10/09/17 FINDINGS: Alignment: Grade 1 anterolisthesis at C3-4 and C4-5. Straightening of normal cervical lordotic curvature. Vertebrae: No fracture, evidence of discitis, or bone lesion. Cord: Normal signal and morphology. Posterior Fossa, vertebral arteries, paraspinal tissues: Cystic focus of the right nasopharynx (12:1), unchanged since 10/09/17. Disc levels: C1-2: Unremarkable. C2-3: Left facet hypertrophy. No disc herniation. There is no spinal canal stenosis. No neural foraminal stenosis. C3-4: Left facet hypertrophy. Small disc bulge. There is no spinal canal stenosis. Worsened severe left neural foraminal stenosis. C4-5: Left facet hypertrophy. No disc herniation. There is no spinal canal stenosis. Unchanged severe left neural foraminal stenosis. C5-6: Left  facet hypertrophy. Small  disc bulge with bilateral uncovertebral osteophytes. There is no spinal canal stenosis. Unchanged severe right and moderate left neural foraminal stenosis. C6-7: Small disc bulge with uncovertebral spurring. There is no spinal canal stenosis. No neural foraminal stenosis. C7-T1: Normal disc space and facet joints. There is no spinal canal stenosis. Mild left neural foraminal stenosis. IMPRESSION: 1. Worsened severe left C3-4 neural foraminal stenosis. 2. Unchanged severe left C4-5 and right C5-6 neural foraminal stenosis. 3. No spinal canal stenosis. Electronically Signed   By: Ulyses Jarred M.D.   On: 12/15/2020 00:48   MR THORACIC SPINE WO CONTRAST  Result Date: 12/15/2020 CLINICAL DATA:  Low and mid back pain.  Neuro deficit EXAM: MRI THORACIC AND LUMBAR SPINE WITHOUT CONTRAST TECHNIQUE: Multiplanar and multiecho pulse sequences of the thoracic and lumbar spine were obtained without intravenous contrast. COMPARISON:  November 29, 2020. FINDINGS: MRI THORACIC SPINE FINDINGS Alignment:  Normal. Vertebrae: Vertebral body heights are maintained. Heterogeneous marrow. No specific evidence of acute fracture, discitis/osteomyelitis, or suspicious bone lesion. Cord:  Normal cord signal. Paraspinal and other soft tissues: Unremarkable Disc levels: Disc bulging with bilateral facet hypertrophy at T9-T10 and T10-T11 results in moderate left and mild right foraminal stenosis. Mild canal stenosis at T9-T10 mild left foraminal stenosis at T11-T12. Small central disc protrusion at T4-T5 and T5-T6 which efface ventral CSF and flatten the ventral cord without significant canal stenosis. Right a centric small disc/osteophyte at T7-T8 partially effaces ventral CSF. MRI LUMBAR SPINE FINDINGS Segmentation:  Standard. Alignment: Levocurvature centered at L3. Similar grade 1 retrolisthesis of L2 on L3. Vertebrae: Similar heterogeneous bone marrow without focal lesion. Degenerative/discogenic endplate signal changes about the right  L4-L5 disc. No specific evidence of acute fracture or discitis/osteomyelitis. Conus medullaris and cauda equina: Conus extends to the level. Conus and cauda equina appear normal. Paraspinal and other soft tissues: Similar cystic area posterior to the right posterior elements. Disc levels: T12-L1: Similar far left lateral/extraforaminal disc protrusion without impingement. No significant canal stenosis. L1-L2: Broad disc bulging and bilateral facet hypertrophy with similar narrowing of the right subarticular recess and similar moderate bilateral foraminal stenosis. L2-L3: Similar large right asymmetric disc bulge with right facet hypertrophy. Similar severe right foraminal stenosis. Similar right subarticular recess narrowing without significant central canal or left foraminal l stenosis. L3-L4: Large right asymmetric disc bulge with right greater than left facet hypertrophy. Similar moderate right foraminal stenosis and right subarticular recess stenosis. No significant central canal stenosis or left foraminal stenosis. L4-L5: Large right asymmetric disc bulge with severe right and moderate left facet hypertrophy. Similar widening of the right facet joint with associated facet joint. Similar moderate to severe canal stenosis, severe right subarticular recess narrowing, and severe right and mild-to-moderate left foraminal stenosis. L5-S1: Left eccentric disc bulge and moderate bilateral facet hypertrophy. Similar moderate left foraminal stenosis. No significant canal or right foraminal stenosis. IMPRESSION: MR LUMBAR SPINE IMPRESSION 1. In comparison to November 29, 2020, similar severe foraminal stenosis on the right at L2-L3 and L4-L5 and multilevel moderate foraminal stenosis, as detailed above. 2. Similar moderate to severe canal stenosis and severe right subarticular recess stenosis at L4-L5. Additional multilevel right subarticular recess narrowing, as detailed above. 3. Levocurvature, centered at L3. MR THORACIC  SPINE IMPRESSION 1. Moderate left and mild right foraminal stenosis at T9-T10 and T10-T11. Mild left foraminal stenosis T11-T12. 2. Mild canal stenosis at T9-T10. Small central disc protrusions at T4-T5 and T5-T6 flatten the ventral cord without significant canal stenosis. Electronically Signed  By: Margaretha Sheffield MD   On: 12/15/2020 07:02   MR LUMBAR SPINE WO CONTRAST  Result Date: 12/15/2020 CLINICAL DATA:  Low and mid back pain.  Neuro deficit EXAM: MRI THORACIC AND LUMBAR SPINE WITHOUT CONTRAST TECHNIQUE: Multiplanar and multiecho pulse sequences of the thoracic and lumbar spine were obtained without intravenous contrast. COMPARISON:  November 29, 2020. FINDINGS: MRI THORACIC SPINE FINDINGS Alignment:  Normal. Vertebrae: Vertebral body heights are maintained. Heterogeneous marrow. No specific evidence of acute fracture, discitis/osteomyelitis, or suspicious bone lesion. Cord:  Normal cord signal. Paraspinal and other soft tissues: Unremarkable Disc levels: Disc bulging with bilateral facet hypertrophy at T9-T10 and T10-T11 results in moderate left and mild right foraminal stenosis. Mild canal stenosis at T9-T10 mild left foraminal stenosis at T11-T12. Small central disc protrusion at T4-T5 and T5-T6 which efface ventral CSF and flatten the ventral cord without significant canal stenosis. Right a centric small disc/osteophyte at T7-T8 partially effaces ventral CSF. MRI LUMBAR SPINE FINDINGS Segmentation:  Standard. Alignment: Levocurvature centered at L3. Similar grade 1 retrolisthesis of L2 on L3. Vertebrae: Similar heterogeneous bone marrow without focal lesion. Degenerative/discogenic endplate signal changes about the right L4-L5 disc. No specific evidence of acute fracture or discitis/osteomyelitis. Conus medullaris and cauda equina: Conus extends to the level. Conus and cauda equina appear normal. Paraspinal and other soft tissues: Similar cystic area posterior to the right posterior elements. Disc  levels: T12-L1: Similar far left lateral/extraforaminal disc protrusion without impingement. No significant canal stenosis. L1-L2: Broad disc bulging and bilateral facet hypertrophy with similar narrowing of the right subarticular recess and similar moderate bilateral foraminal stenosis. L2-L3: Similar large right asymmetric disc bulge with right facet hypertrophy. Similar severe right foraminal stenosis. Similar right subarticular recess narrowing without significant central canal or left foraminal l stenosis. L3-L4: Large right asymmetric disc bulge with right greater than left facet hypertrophy. Similar moderate right foraminal stenosis and right subarticular recess stenosis. No significant central canal stenosis or left foraminal stenosis. L4-L5: Large right asymmetric disc bulge with severe right and moderate left facet hypertrophy. Similar widening of the right facet joint with associated facet joint. Similar moderate to severe canal stenosis, severe right subarticular recess narrowing, and severe right and mild-to-moderate left foraminal stenosis. L5-S1: Left eccentric disc bulge and moderate bilateral facet hypertrophy. Similar moderate left foraminal stenosis. No significant canal or right foraminal stenosis. IMPRESSION: MR LUMBAR SPINE IMPRESSION 1. In comparison to November 29, 2020, similar severe foraminal stenosis on the right at L2-L3 and L4-L5 and multilevel moderate foraminal stenosis, as detailed above. 2. Similar moderate to severe canal stenosis and severe right subarticular recess stenosis at L4-L5. Additional multilevel right subarticular recess narrowing, as detailed above. 3. Levocurvature, centered at L3. MR THORACIC SPINE IMPRESSION 1. Moderate left and mild right foraminal stenosis at T9-T10 and T10-T11. Mild left foraminal stenosis T11-T12. 2. Mild canal stenosis at T9-T10. Small central disc protrusions at T4-T5 and T5-T6 flatten the ventral cord without significant canal stenosis.  Electronically Signed   By: Margaretha Sheffield MD   On: 12/15/2020 07:02   DG Chest Port 1 View  Result Date: 12/14/2020 CLINICAL DATA:  Weakness. EXAM: PORTABLE CHEST 1 VIEW COMPARISON:  June 08, 2018. FINDINGS: Stable cardiomediastinal silhouette. Status post aortic root repair. No pneumothorax or pleural effusion is noted. Lungs are clear. Bony thorax is unremarkable. IMPRESSION: No acute cardiopulmonary abnormality seen. Aortic Atherosclerosis (ICD10-I70.0). Electronically Signed   By: Marijo Conception M.D.   On: 12/14/2020 16:37     Scheduled  Meds: . aspirin  325 mg Oral Daily  . carvedilol  25 mg Oral BID WC  . clonazePAM  1 mg Oral QHS  . melatonin  10 mg Oral QHS  . montelukast  10 mg Oral QHS  . primidone  50 mg Oral QHS  . spironolactone  12.5 mg Oral Daily  . tamsulosin  0.4 mg Oral QPC supper   Continuous Infusions:   LOS: 0 days    Donnamae Jude, MD 12/15/2020 4:27 PM [phone] [facility] If 7PM-7AM, please contact night-coverage 12/15/2020, 4:27 PM

## 2020-12-15 NOTE — ED Notes (Signed)
Assumed care of patient. A/A/O, remains on continuous cardiac and 02 sat monitoring. No distress.

## 2020-12-15 NOTE — ED Notes (Signed)
Sitting up in bed eating lunch

## 2020-12-15 NOTE — ED Provider Notes (Signed)
Discussed MRI findings with Dr. Quinn Axe with neurology.  She request more MRIs.  After further discussion, patient will be admitted to the hospitalist for further work-up.  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  Patient apparently is on Abilify and she is going to discontinue this D/w dr Leeanne Mannan for admission   Ripley Fraise, MD 12/15/20 0140

## 2020-12-15 NOTE — H&P (Signed)
History and Physical    Travis Reese:[id number] DOB: 03/31/1946 DOA: 12/14/2020  PCP: Janine Limbo, PA-C  Patient coming from: Home.  Chief Complaint: Weakness with difficulty ambulating.  HPI: Travis Reese is a 75 y.o. 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 has been experiencing worsening lower extremity weakness and difficulty ambulating over the last 5 weeks.  Patient symptoms worsen after patient had a fall at the shower 5 weeks ago.  Since then patient had 2 more falls last one was 1 week ago.  Patient is finding it very difficult to walk because of the weakness.  It involves both upper and lower extremity but lower extremity is small pronounced.  Patient uses a walker.  Denies any incontinence of urine or bowel.  Denies any fever or chills.  Has used epidural injections previously for back pain.  Last 1 was more than 3 months ago.  ED Course: In the ER patient had a CT head and C-spine and MRI brain and MRI C-spine.  Neurologist on-call was consulted.  MRI C-spine showed known left-sided neural foraminal stenosis.  MRI brain was showing diffusion abnormality as per neurologist no signs of any acute stroke.  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  Labs are largely unremarkable.  Covid test was negative.  On exam patient is unable to raise his both lower extremity against gravity.  Upper extremities almost 4 x 5.  Review of Systems: As per HPI, rest all negative.   Past Medical History:  Diagnosis Date  . Allergic rhinosinusitis   . Anxiety   . Aortic valve sclerosis   . Arthritis    knees, neck, shoulder  . CHF (congestive heart failure) ([place])   . Cubital tunnel syndrome   . DDD (degenerative disc disease), cervical   . Depression   .  Elevated PSA    denies per patient  . HTN (hypertension)   . Hyperlipidemia   . Hyperplasia of prostate with lower urinary tract symptoms (LUTS)   . Hypertension   . Mild dilation of ascending aorta ([facility])   . 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 Rectal prolapse   . Scoliosis of lumbosacral spine   . Tremor   . Vitamin D deficiency   . Wears hearing aid    bilateral    Past Surgical History:  Procedure Laterality Date  . 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 BENTALL PROCEDURE N/A 05/01/2018   Procedure: BIOLOGICAL BENTALL PROCEDURE AORTIC ROOT REPLACEMENT WITH 30MM VALSALVA GRAFT & 27MM INSPIRIS VALVE.;  Surgeon: Rexene Alberts, MD;  Location: [place];  Service: Open Heart Surgery;  Laterality: N/A;  . CATARACT EXTRACTION  04/2018  . COLONOSCOPY  2007  . NASAL SINUS SURGERY  2006  . RIGHT/LEFT HEART CATH AND CORONARY ANGIOGRAPHY N/A 04/27/2018   Procedure: RIGHT/LEFT HEART CATH AND CORONARY ANGIOGRAPHY;  Surgeon: Jolaine Artist, MD;  Location: [facility];  Service: Cardiovascular;  Laterality: N/A;  . TEE WITHOUT CARDIOVERSION N/A 04/27/2018   Procedure: TRANSESOPHAGEAL ECHOCARDIOGRAM (TEE);  Surgeon: Jolaine Artist, MD;  Location: [facility] ENDOSCOPY;  Service: Cardiovascular;  Laterality: N/A;  . TEE WITHOUT CARDIOVERSION N/A 05/01/2018   Procedure: TRANSESOPHAGEAL ECHOCARDIOGRAM (TEE);  Surgeon: Rexene Alberts, MD;  Location: [place];  Service: Open Heart Surgery;  Laterality:  N/A;  . TRANSANAL HEMORRHOIDAL DEARTERIALIZATION N/A 11/15/2015   Procedure: TRANSANAL HEMORRHOIDAL DEARTERIALIZATION;  Surgeon: Leighton Ruff, MD;  Location: [facility];  Service: General;  Laterality: N/A;     reports that he quit smoking about 2 years ago. His smoking use included cigarettes. He has a 25.00 pack-year smoking history. He has never used smokeless tobacco. He reports current alcohol use. He reports that he does not use drugs.  No Known  Allergies  Family History  Problem Relation Age of Onset  . COPD Mother   . Heart attack Mother   . Colon polyps Father   . Prostate cancer Father   . Parkinson's disease Father   . Hyperlipidemia Father   . Hypertension Sister   . Hypertension Brother   . Breast cancer Sister   . Prostate cancer Paternal Uncle     Prior to Admission medications   Medication Sig Start Date End Date Taking? Authorizing Provider  acetaminophen (TYLENOL) 500 MG tablet Take 500 mg by mouth every 8 (eight) hours as needed for mild pain.   Yes [provider]  albuterol (VENTOLIN HFA) 108 (90 Base) MCG/ACT inhaler Inhale 2 puffs into the lungs every 6 (six) hours as needed for wheezing or shortness of breath.   Yes [provider]  ARIPiprazole (ABILIFY) 10 MG tablet Take 10 mg by mouth daily. 12/14/20  Yes [provider]  aspirin EC 325 MG EC tablet Take 1 tablet (325 mg total) by mouth daily. 05/08/18  Yes Barrett, Erin R, PA-C  carvedilol (COREG) 25 MG tablet TAKE 1 TABLET BY MOUTH  TWICE DAILY WITH MEALS Patient taking differently: Take 25 mg by mouth 2 (two) times daily with a meal. 05/03/20  Yes Bensimhon, Shaune Pascal, MD  celecoxib (CELEBREX) 100 MG capsule Take 100 mg by mouth daily.   Yes [provider]  clonazePAM (KLONOPIN) 1 MG tablet Take 1 mg by mouth at bedtime.   Yes [provider]  fluticasone (FLONASE) 50 MCG/ACT nasal spray Place into both nostrils. 12/07/20  Yes [provider]  furosemide (LASIX) 40 MG tablet Take 40 mg by mouth daily as needed for fluid (Take as needed for 3lb weight gain).   Yes [provider]  Javier Docker Oil 1000 MG CAPS Take 1,000 mg by mouth daily.   Yes [provider]  Melatonin 10 MG CAPS Take 10 mg by mouth at bedtime.   Yes [provider]  Multiple Vitamins-Minerals (ZINC PO) Take 1 tablet by mouth daily.   Yes [provider]  primidone (MYSOLINE) 50 MG tablet Take 1 tablet (50 mg  total) by mouth at bedtime. 05/18/18  Yes Tat, Eustace Quail, DO  spironolactone (ALDACTONE) 25 MG tablet Take 0.5 tablets (12.5 mg total) by mouth daily. NEEDS AN APPOINTMENT FOR FURTHER REFILLS Patient taking differently: Take 25 mg by mouth daily. 11/28/20  Yes Park Liter, MD  tadalafil (CIALIS) 5 MG tablet Take 5 mg by mouth daily.   Yes [provider]  tamsulosin (FLOMAX) 0.4 MG CAPS capsule Take 0.4 mg by mouth in the morning and at bedtime.   Yes [provider]  Vitamin D, Ergocalciferol, (DRISDOL) 1.25 MG (50000 UT) CAPS capsule Take 50,000 Units by mouth every Monday.   Yes [provider]  montelukast (SINGULAIR) 10 MG tablet Take 10 mg by mouth at bedtime. 05/28/17   [provider]    Physical Exam: Constitutional: Moderately built and nourished. Vitals:   12/14/20 2215 12/14/20  2315 12/14/20 2345 12/15/20 0053  BP: 131/84 125/68 97/60 (!) 133/95  Pulse: 81 73 66 70  Resp: 17 20 15 20   Temp:      TempSrc:      SpO2: 93% 95% 92% 95%   Eyes: Anicteric no pallor. ENMT: No discharge from the ears eyes nose or mouth. Neck: No mass felt.  No neck rigidity. Respiratory: No rhonchi or crepitations. Cardiovascular: S1-S2 heard. Abdomen: Soft nontender bowel sounds present. Musculoskeletal: No edema. Skin: Chronic skin changes. Neurologic: Alert awake oriented to time place and person.  Both upper extremities are 4 x 5 in both lower extremities around 2 x 5.  Brisk reflexes. Psychiatric: Appears normal.  Normal affect.   Labs on Admission: I have personally reviewed following labs and imaging studies  CBC: Recent Labs  Lab 12/14/20 1618  WBC 9.7  HGB 14.0  HCT 41.0  MCV 100.5*  PLT 093   Basic Metabolic Panel: Recent Labs  Lab 12/14/20 1618  NA 137  K 4.2  CL 103  CO2 24  GLUCOSE 98  BUN 13  CREATININE 0.79  CALCIUM 8.7*   GFR: CrCl cannot be calculated (Unknown ideal weight.). Liver Function Tests: Recent Labs   Lab 12/14/20 1618  AST 12*  ALT 9  ALKPHOS 57  BILITOT 0.8  PROT 5.9*  ALBUMIN 3.1*   No results for input(s): LIPASE, AMYLASE in the last 168 hours. No results for input(s): AMMONIA in the last 168 hours. Coagulation Profile: No results for input(s): INR, PROTIME in the last 168 hours. Cardiac Enzymes: No results for input(s): CKTOTAL, CKMB, CKMBINDEX, TROPONINI in the last 168 hours. BNP (last 3 results) No results for input(s): PROBNP in the last 8760 hours. HbA1C: No results for input(s): HGBA1C in the last 72 hours. CBG: No results for input(s): GLUCAP in the last 168 hours. Lipid Profile: No results for input(s): CHOL, HDL, LDLCALC, TRIG, CHOLHDL, LDLDIRECT in the last 72 hours. Thyroid Function Tests: No results for input(s): TSH, T4TOTAL, FREET4, T3FREE, THYROIDAB in the last 72 hours. Anemia Panel: No results for input(s): VITAMINB12, FOLATE, FERRITIN, TIBC, IRON, RETICCTPCT in the last 72 hours. Urine analysis:    Component Value Date/Time   COLORURINE YELLOW 12/14/2020 1701   APPEARANCEUR CLEAR 12/14/2020 1701   LABSPEC 1.013 12/14/2020 1701   PHURINE 6.0 12/14/2020 1701   GLUCOSEU NEGATIVE 12/14/2020 1701   HGBUR NEGATIVE 12/14/2020 1701   BILIRUBINUR NEGATIVE 12/14/2020 1701   KETONESUR NEGATIVE 12/14/2020 1701   PROTEINUR NEGATIVE 12/14/2020 1701   NITRITE NEGATIVE 12/14/2020 1701   LEUKOCYTESUR NEGATIVE 12/14/2020 1701   Sepsis Labs: @LABRCNTIP (procalcitonin:4,lacticidven:4) ) Recent Results (from the past 240 hour(s))  Resp Panel by RT-PCR (Flu A&B, Covid) Nasopharyngeal Swab     Status: None   Collection Time: 12/14/20  5:49 PM   Specimen: Nasopharyngeal Swab; Nasopharyngeal(NP) swabs in vial transport medium  Result Value Ref Range Status   SARS Coronavirus 2 by RT PCR NEGATIVE NEGATIVE Final    Comment: (NOTE) SARS-CoV-2 target nucleic acids are NOT DETECTED.  The SARS-CoV-2 RNA is generally detectable in upper respiratory specimens during  the acute phase of infection. The lowest concentration of SARS-CoV-2 viral copies this assay can detect is 138 copies/mL. A negative result does not preclude SARS-Cov-2 infection and should not be used as the sole basis for treatment or other patient management decisions. A negative result may occur with  improper specimen collection/handling, submission of specimen other than nasopharyngeal swab, presence of viral mutation(s) within the areas  targeted by this assay, and inadequate number of viral copies(<138 copies/mL). A negative result must be combined with clinical observations, patient history, and epidemiological information. The expected result is Negative.  Fact Sheet for Patients:  EntrepreneurPulse.com.au  Fact Sheet for Healthcare Providers:  IncredibleEmployment.be  This test is no t yet approved or cleared by the [place] FDA and  has been authorized for detection and/or diagnosis of SARS-CoV-2 by FDA under an Emergency Use Authorization (EUA). This EUA will remain  in effect (meaning this test can be used) for the duration of the COVID-19 declaration under Section 564(b)(1) of the Act, 21 U.S.C.section 360bbb-3(b)(1), unless the authorization is terminated  or revoked sooner.       Influenza A by PCR NEGATIVE NEGATIVE Final   Influenza B by PCR NEGATIVE NEGATIVE Final    Comment: (NOTE) The Xpert Xpress SARS-CoV-2/FLU/RSV plus assay is intended as an aid in the diagnosis of influenza from Nasopharyngeal swab specimens and should not be used as a sole basis for treatment. Nasal washings and aspirates are unacceptable for Xpert Xpress SARS-CoV-2/FLU/RSV testing.  Fact Sheet for Patients: EntrepreneurPulse.com.au  Fact Sheet for Healthcare Providers: IncredibleEmployment.be  This test is not yet approved or cleared by the [place] FDA and has been authorized for detection and/or  diagnosis of SARS-CoV-2 by FDA under an Emergency Use Authorization (EUA). This EUA will remain in effect (meaning this test can be used) for the duration of the COVID-19 declaration under Section 564(b)(1) of the Act, 21 U.S.C. section 360bbb-3(b)(1), unless the authorization is terminated or revoked.  Performed at [facility], [place] [address] [place],  [place] [address]      Radiological Exams on Admission: CT HEAD WO CONTRAST  Result Date: 12/14/2020 CLINICAL DATA:  Fall, pain EXAM: CT HEAD WITHOUT CONTRAST CT CERVICAL SPINE WITHOUT CONTRAST TECHNIQUE: Multidetector CT imaging of the head and cervical spine was performed following the standard protocol without intravenous contrast. Multiplanar CT image reconstructions of the cervical spine were also generated. COMPARISON:  None. FINDINGS: CT HEAD FINDINGS Brain: No evidence of acute infarction, hemorrhage, hydrocephalus, extra-axial collection or mass lesion/mass effect. Periventricular and deep white matter hypodensity. Vascular: No hyperdense vessel or unexpected calcification. Skull: Normal. Negative for fracture or focal lesion. Sinuses/Orbits: No acute finding. Other: Soft tissue contusion of the right forehead (series 1, image 16). CT CERVICAL SPINE FINDINGS Alignment: Normal. Skull base and vertebrae: No acute fracture. No primary bone lesion or focal pathologic process. Soft tissues and spinal canal: No prevertebral fluid or swelling. No visible canal hematoma. Disc levels: Moderate multilevel disc space height loss and osteophytosis. Upper chest: Emphysema. Other: None. IMPRESSION: 1. No acute intracranial pathology. Small-vessel white matter disease. 2. Soft tissue contusion of the right forehead. 3. No fracture or static subluxation of the cervical spine. 4. Moderate multilevel disc space height loss and osteophytosis. 5. Emphysema in the included lung apices. Emphysema (ICD10-J43.9). Electronically Signed   By: Eddie Candle M.D.    On: 12/14/2020 17:07   CT CERVICAL SPINE WO CONTRAST  Result Date: 12/14/2020 CLINICAL DATA:  Fall, pain EXAM: CT HEAD WITHOUT CONTRAST CT CERVICAL SPINE WITHOUT CONTRAST TECHNIQUE: Multidetector CT imaging of the head and cervical spine was performed following the standard protocol without intravenous contrast. Multiplanar CT image reconstructions of the cervical spine were also generated. COMPARISON:  None. FINDINGS: CT HEAD FINDINGS Brain: No evidence of acute infarction, hemorrhage, hydrocephalus, extra-axial collection or mass lesion/mass effect. Periventricular and deep white matter hypodensity. Vascular: No hyperdense vessel  or unexpected calcification. Skull: Normal. Negative for fracture or focal lesion. Sinuses/Orbits: No acute finding. Other: Soft tissue contusion of the right forehead (series 1, image 16). CT CERVICAL SPINE FINDINGS Alignment: Normal. Skull base and vertebrae: No acute fracture. No primary bone lesion or focal pathologic process. Soft tissues and spinal canal: No prevertebral fluid or swelling. No visible canal hematoma. Disc levels: Moderate multilevel disc space height loss and osteophytosis. Upper chest: Emphysema. Other: None. IMPRESSION: 1. No acute intracranial pathology. Small-vessel white matter disease. 2. Soft tissue contusion of the right forehead. 3. No fracture or static subluxation of the cervical spine. 4. Moderate multilevel disc space height loss and osteophytosis. 5. Emphysema in the included lung apices. Emphysema (ICD10-J43.9). Electronically Signed   By: Eddie Candle M.D.   On: 12/14/2020 17:07   MR BRAIN WO CONTRAST  Result Date: 12/14/2020 CLINICAL DATA:  Acute neuro deficit EXAM: MRI HEAD WITHOUT CONTRAST TECHNIQUE: Multiplanar, multiecho pulse sequences of the brain and surrounding structures were obtained without intravenous contrast. COMPARISON:  CT head 12/14/2020 FINDINGS: Brain: Cluster of small areas of hyperintensity on diffusion-weighted imaging  in the left parietal cortex and white matter. Possible acute or subacute infarct. No definite ADC correlate. Generalized atrophy. Mild to moderate white matter changes with periventricular deep white matter hyperintensities bilaterally. Small chronic infarct left cerebellum. Negative for hemorrhage or mass. Vascular: Normal arterial flow voids. Skull and upper cervical spine: Negative Sinuses/Orbits: Paranasal sinuses clear. Bilateral cataract extraction Other: None IMPRESSION: Several small areas of diffusion hyperintensity in the left parietal white matter and cortex. Possible acute or subacute infarct. Generalized atrophy. Mild to moderate chronic microvascular ischemic changes in the white matter. Electronically Signed   By: Franchot Gallo M.D.   On: 12/14/2020 20:57   MR Cervical Spine Wo Contrast  Result Date: 12/15/2020 CLINICAL DATA:  Bilateral arm and leg weakness for 1-2 months EXAM: MRI CERVICAL SPINE WITHOUT CONTRAST TECHNIQUE: Multiplanar, multisequence MR imaging of the cervical spine was performed. No intravenous contrast was administered. COMPARISON:  MRI cervical spine 06/27/2018 and 10/09/17 FINDINGS: Alignment: Grade 1 anterolisthesis at C3-4 and C4-5. Straightening of normal cervical lordotic curvature. Vertebrae: No fracture, evidence of discitis, or bone lesion. Cord: Normal signal and morphology. Posterior Fossa, vertebral arteries, paraspinal tissues: Cystic focus of the right nasopharynx (12:1), unchanged since 10/09/17. Disc levels: C1-2: Unremarkable. C2-3: Left facet hypertrophy. No disc herniation. There is no spinal canal stenosis. No neural foraminal stenosis. C3-4: Left facet hypertrophy. Small disc bulge. There is no spinal canal stenosis. Worsened severe left neural foraminal stenosis. C4-5: Left facet hypertrophy. No disc herniation. There is no spinal canal stenosis. Unchanged severe left neural foraminal stenosis. C5-6: Left facet hypertrophy. Small disc bulge with bilateral  uncovertebral osteophytes. There is no spinal canal stenosis. Unchanged severe right and moderate left neural foraminal stenosis. C6-7: Small disc bulge with uncovertebral spurring. There is no spinal canal stenosis. No neural foraminal stenosis. C7-T1: Normal disc space and facet joints. There is no spinal canal stenosis. Mild left neural foraminal stenosis. IMPRESSION: 1. Worsened severe left C3-4 neural foraminal stenosis. 2. Unchanged severe left C4-5 and right C5-6 neural foraminal stenosis. 3. No spinal canal stenosis. Electronically Signed   By: Ulyses Jarred M.D.   On: 12/15/2020 00:48   DG Chest Port 1 View  Result Date: 12/14/2020 CLINICAL DATA:  Weakness. EXAM: PORTABLE CHEST 1 VIEW COMPARISON:  June 08, 2018. FINDINGS: Stable cardiomediastinal silhouette. Status post aortic root repair. No pneumothorax or pleural effusion is noted. Lungs are  clear. Bony thorax is unremarkable. IMPRESSION: No acute cardiopulmonary abnormality seen. Aortic Atherosclerosis (ICD10-I70.0). Electronically Signed   By: Marijo Conception M.D.   On: 12/14/2020 16:37    EKG: Independently reviewed.  Normal sinus rhythm.  Assessment/Plan Principal Problem:   Weakness Active Problems:   HTN (hypertension)   BPH (benign prostatic hyperplasia)   S/P aortic valve replacement with bioprosthetic valve   Other emphysema ([place])    1. Increasing weakness with features concerning for parkinsonism and also has increased reflexes -appreciate neurology consult.  At this time neurology has recommended getting MRI of the T and L-spine to make sure there is no acute component given the recent falls.  Neurology advised to hold off Abilify.  Further recommendation based on MRI is ordered.  Physical therapy consult. 2. History of hypertension presently on spironolactone. 3. History of bioprosthetic aortic valve replacement appears compensated. 4. BPH on Flomax. 5. History of lumbar and C-spine radiculopathy. 6. Emphysema -not  wheezing.   DVT prophylaxis: SCDs for now until we get MRI results. Code Status: Full code. Family Communication: Discussed with patient. Disposition Plan: To be determined. Consults called: Neurology. Admission status: Observation.   Rise Patience MD [facility] Pager [phone].  If 7PM-7AM, please contact night-coverage [email or web address] Password [email or web address]  12/15/2020, 1:52 AM

## 2020-12-15 NOTE — Consult Note (Signed)
NEUROLOGY CONSULTATION NOTE   Date of service: December 15, 2020 Patient Name: Travis Reese MRN:  [id number] DOB:  February 13, 1946 Reason for consult: progressive gait difficulty x1 yr with recent decompensation  _ _ _   _ __   _ __ _ _  __ __   _ __   __ _  History of Present Illness   Travis Reese is a 75 y.o. male with PMH significant for  has a past medical history of Allergic rhinosinusitis, Anxiety, Aortic valve sclerosis, Arthritis, CHF (congestive heart failure) ([place]), Cubital tunnel syndrome, DDD (degenerative disc disease), cervical, Depression, Elevated PSA, HTN (hypertension), Hyperlipidemia, Hyperplasia of prostate with lower urinary tract symptoms (LUTS), Hypertension, Mild dilation of ascending aorta (HCC), Prolapsed internal hemorrhoids, grade 3 (04/19/2015), Rectal prolapse, Scoliosis of lumbosacral spine, Tremor, Vitamin D deficiency, and Wears hearing aid. who presents with progressive BLE>BUE weakness x once yr with decompensation ~3 wks ago when his legs gave out in the shower and he had a fall. Daughter reported over the last 3 wks he has become progressively stooped and shuffling with walking and now freezing and barely able to ambulate with RW. Known hx severe bilat cervical radiculopathy and R lumbar radiculopathy. L parietal hyperintensities on diffusion have no definitive ADC correlate and are not favored to be acute/subacute as considered in the radiology report. MRI c spine in ED redemonstrated severe bilat cervical neuroforaminal stenosis. Pt is on abilify, uncear when this was started.   ROS   10 point review of systems was performed and was negative except as described in HPI.  Past History   Past Medical History:  Diagnosis Date  . Allergic rhinosinusitis   . Anxiety   . Aortic valve sclerosis   . Arthritis    knees, neck, shoulder  . CHF (congestive heart failure) ([place])   . Cubital tunnel syndrome   . DDD (degenerative disc disease), cervical   . Depression    . Elevated PSA    denies per patient  . HTN (hypertension)   . Hyperlipidemia   . Hyperplasia of prostate with lower urinary tract symptoms (LUTS)   . Hypertension   . Mild dilation of ascending aorta (HCC)   . 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 Rectal prolapse   . Scoliosis of lumbosacral spine   . Tremor   . Vitamin D deficiency   . Wears hearing aid    bilateral   Past Surgical History:  Procedure Laterality Date  . 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 BENTALL PROCEDURE N/A 05/01/2018   Procedure: BIOLOGICAL BENTALL PROCEDURE AORTIC ROOT REPLACEMENT WITH 30MM VALSALVA GRAFT & 27MM INSPIRIS VALVE.;  Surgeon: Rexene Alberts, MD;  Location: [facility];  Service: Open Heart Surgery;  Laterality: N/A;  . CATARACT EXTRACTION  04/2018  . COLONOSCOPY  2007  . NASAL SINUS SURGERY  2006  . RIGHT/LEFT HEART CATH AND CORONARY ANGIOGRAPHY N/A 04/27/2018   Procedure: RIGHT/LEFT HEART CATH AND CORONARY ANGIOGRAPHY;  Surgeon: Jolaine Artist, MD;  Location: [facility];  Service: Cardiovascular;  Laterality: N/A;  . TEE WITHOUT CARDIOVERSION N/A 04/27/2018   Procedure: TRANSESOPHAGEAL ECHOCARDIOGRAM (TEE);  Surgeon: Jolaine Artist, MD;  Location: [facility];  Service: Cardiovascular;  Laterality: N/A;  . TEE WITHOUT CARDIOVERSION N/A 05/01/2018   Procedure: TRANSESOPHAGEAL ECHOCARDIOGRAM (TEE);  Surgeon: Rexene Alberts, MD;  Location: [facility];  Service: Open Heart Surgery;  Laterality: N/A;  . TRANSANAL HEMORRHOIDAL DEARTERIALIZATION N/A 11/15/2015   Procedure: TRANSANAL  HEMORRHOIDAL DEARTERIALIZATION;  Surgeon: Leighton Ruff, MD;  [facility];  Service: General;  Laterality: N/A;   Family History  Problem Relation Age of Onset  . COPD Mother   . Heart attack Mother   . Colon polyps Father   . Prostate cancer Father   . Parkinson's disease Father   . Hyperlipidemia Father   . Hypertension Sister   . Hypertension Brother   . Breast  cancer Sister   . Prostate cancer Paternal Uncle    Social History   Socioeconomic History  . Marital status: Single    Spouse name: Not on file  . Number of children: 2  . Years of education: Not on file  . Highest education level: Not on file  Occupational History  . Occupation: retired    Comment: Presenter, broadcasting  Tobacco Use  . Smoking status: Former Smoker    Packs/day: 0.50    Years: 50.00    Pack years: 25.00    Types: Cigarettes    Quit date: 05/2018    Years since quitting: 2.6  . Smokeless tobacco: Never Used  Vaping Use  . Vaping Use: Former  Substance and Sexual Activity  . Alcohol use: Yes    Alcohol/week: 0.0 standard drinks    Comment: Occasional  . Drug use: No  . Sexual activity: Not Currently  Other Topics Concern  . Not on file  Social History Narrative   Divorced lives with father after incarceration x 5 yrs   1 son, 1 daughter   Psychologist, occupational work   3 caffeine/day   04/19/2015   Social Determinants of Health   Financial Resource Strain: Not on file  Food Insecurity: Not on file  Transportation Needs: Not on file  Physical Activity: Not on file  Stress: Not on file  Social Connections: Not on file   No Known Allergies  Medications   (Not in a hospital admission)    Vitals   Vitals:   12/14/20 2215 12/14/20 2315 12/14/20 2345 12/15/20 0053  BP: 131/84 125/68 97/60 (!) 133/95  Pulse: 81 73 66 70  Resp: 17 20 15 20   Temp:      TempSrc:      SpO2: 93% 95% 92% 95%     There is no height or weight on file to calculate BMI.  Physical Exam   Physical Exam Gen: A&O x4, NAD HEENT: Atraumatic, normocephalic;mucous membranes moist; oropharynx clear, tongue without atrophy or fasciculations. Neck: Supple, trachea midline. Resp: CTAB, no w/r/r CV: RRR, no m/g/r; nml S1 and S2. 2+ symmetric peripheral pulses. Abd: soft/NT/ND; nabs x 4 quad Extrem: Nml bulk; no cyanosis, clubbing, or edema.  Neuro: *MS: A&O x4. Follows multi-step  commands.  *Speech: fluid, mild dysarthria, able to name and repeat *CN:    I: Deferred   II,III: PERRLA, VFF by confrontation   III,IV,VI: EOMI w/o nystagmus, no ptosis   V: Sensation intact from V1 to V3 to LT   VII: Eyelid closure was full.  Smile symmetric. Masked facies.   VIII: Hearing intact to voice   IX,X: Voice normal, palate elevates symmetrically    XI: SCM/trap 5/5 bilat   XII: Tongue protrudes midline, no atrophy or fasciculations   *Motor:  Normal bulk.  No tremor. Significant cogwheeling rigidity BUE, bradykinetic. Freezing while gesturing as well as walking.    Strength: Dlt Bic Tri WrE WrF FgS Gr HF KnF KnE PlF DoF    Left 4+ 4+ 4 4 4+ 4+ 4  4 4 4  4+ 4+    Right 4+ 4+ 4 4 4+ 4+ 4+ 4 4 4  4+ 4+   *Sensory: Intact to light touch, pinprick, temperature vibration throughout. Symmetric. Propioception intact bilat.  No double-simultaneous extinction.  *Coordination:  FNF bradykinetic with mild intention tremor bilat. RAM bradykinetic and incoordinated. *Reflexes:  3+ and symmetric throughout with unsustained clonus at bilat achilles; toes down-going bilat *Gait: extremely bradykinetic, shuffling gait requiring 1 person assist, freezing, turns en bloc   Labs   CBC:  Recent Labs  Lab 12/14/20 1618  WBC 9.7  HGB 14.0  HCT 41.0  MCV 100.5*  PLT 875    Basic Metabolic Panel:  Lab Results  Component Value Date   NA 137 12/14/2020   K 4.2 12/14/2020   CO2 24 12/14/2020   GLUCOSE 98 12/14/2020   BUN 13 12/14/2020   CREATININE 0.79 12/14/2020   CALCIUM 8.7 (L) 12/14/2020   GFRNONAA >60 12/14/2020   GFRAA >60 05/15/2018   Lipid Panel:  Lab Results  Component Value Date   LDLCALC 53 04/29/2018   HgbA1c: No results found for: HGBA1C Urine Drug Screen:     Component Value Date/Time   LABOPIA NONE DETECTED 04/25/2018 0518   COCAINSCRNUR NONE DETECTED 04/25/2018 0518   LABBENZ NONE DETECTED 04/25/2018 0518   AMPHETMU NONE DETECTED 04/25/2018 0518   THCU NONE  DETECTED 04/25/2018 0518   LABBARB POSITIVE (A) 04/25/2018 0518    Alcohol Level No results found for: ETH    Impression   Pt is extremely Parkinsonian on exam which may be secondary to his abilify. Parkinsonism so prouonced that this would significantly exacerbate his pre-existing BLE>BUE weakness 2/2 known cervical and lumbar DDD with severe bilat radiculopathy. Will order MRI t and l spine to rule out acute component of spinal cord pathology esp given recent fall. Pt has marked hyperreflexia on exam which could suggest motor neuron disease such as ALS but that would not explain his Parkinsonism and his hyperreflexia may be accounted for by his cervical and lumbar stenoses.   Recommendations   - MRI t spine wo contrast - MRI l spine wo contrast - Hold abilify  Will continue to follow ______________________________________________________________________   Thank you for the opportunity to take part in the care of this patient. If you have any further questions, please contact the neurology consultation attending.  Signed,  Su Monks, MD [facility] [phone]  If 7pm- 7am, please page neurology on call as listed in [place].

## 2020-12-16 DIAGNOSIS — G2119 Other drug induced secondary parkinsonism: Secondary | ICD-10-CM | POA: Diagnosis present

## 2020-12-16 DIAGNOSIS — Z8249 Family history of ischemic heart disease and other diseases of the circulatory system: Secondary | ICD-10-CM | POA: Diagnosis not present

## 2020-12-16 DIAGNOSIS — J309 Allergic rhinitis, unspecified: Secondary | ICD-10-CM | POA: Diagnosis present

## 2020-12-16 DIAGNOSIS — J438 Other emphysema: Secondary | ICD-10-CM | POA: Diagnosis present

## 2020-12-16 DIAGNOSIS — M419 Scoliosis, unspecified: Secondary | ICD-10-CM | POA: Diagnosis present

## 2020-12-16 DIAGNOSIS — I10 Essential (primary) hypertension: Secondary | ICD-10-CM | POA: Diagnosis present

## 2020-12-16 DIAGNOSIS — Z87891 Personal history of nicotine dependence: Secondary | ICD-10-CM | POA: Diagnosis not present

## 2020-12-16 DIAGNOSIS — I358 Other nonrheumatic aortic valve disorders: Secondary | ICD-10-CM | POA: Diagnosis present

## 2020-12-16 DIAGNOSIS — M5010 Cervical disc disorder with radiculopathy, unspecified cervical region: Secondary | ICD-10-CM | POA: Diagnosis present

## 2020-12-16 DIAGNOSIS — F319 Bipolar disorder, unspecified: Secondary | ICD-10-CM | POA: Diagnosis present

## 2020-12-16 DIAGNOSIS — M1990 Unspecified osteoarthritis, unspecified site: Secondary | ICD-10-CM | POA: Diagnosis present

## 2020-12-16 DIAGNOSIS — F419 Anxiety disorder, unspecified: Secondary | ICD-10-CM | POA: Diagnosis present

## 2020-12-16 DIAGNOSIS — R531 Weakness: Secondary | ICD-10-CM | POA: Diagnosis not present

## 2020-12-16 DIAGNOSIS — E559 Vitamin D deficiency, unspecified: Secondary | ICD-10-CM | POA: Diagnosis present

## 2020-12-16 DIAGNOSIS — G250 Essential tremor: Secondary | ICD-10-CM | POA: Diagnosis present

## 2020-12-16 DIAGNOSIS — Z953 Presence of xenogenic heart valve: Secondary | ICD-10-CM | POA: Diagnosis not present

## 2020-12-16 DIAGNOSIS — Z20822 Contact with and (suspected) exposure to covid-19: Secondary | ICD-10-CM | POA: Diagnosis present

## 2020-12-16 DIAGNOSIS — R296 Repeated falls: Secondary | ICD-10-CM | POA: Diagnosis present

## 2020-12-16 DIAGNOSIS — E785 Hyperlipidemia, unspecified: Secondary | ICD-10-CM | POA: Diagnosis present

## 2020-12-16 DIAGNOSIS — Z83438 Family history of other disorder of lipoprotein metabolism and other lipidemia: Secondary | ICD-10-CM | POA: Diagnosis not present

## 2020-12-16 DIAGNOSIS — R251 Tremor, unspecified: Secondary | ICD-10-CM | POA: Diagnosis present

## 2020-12-16 DIAGNOSIS — N400 Enlarged prostate without lower urinary tract symptoms: Secondary | ICD-10-CM | POA: Diagnosis present

## 2020-12-16 DIAGNOSIS — M5116 Intervertebral disc disorders with radiculopathy, lumbar region: Secondary | ICD-10-CM | POA: Diagnosis present

## 2020-12-16 DIAGNOSIS — Z974 Presence of external hearing-aid: Secondary | ICD-10-CM | POA: Diagnosis not present

## 2020-12-16 DIAGNOSIS — Z825 Family history of asthma and other chronic lower respiratory diseases: Secondary | ICD-10-CM | POA: Diagnosis not present

## 2020-12-16 DIAGNOSIS — E538 Deficiency of other specified B group vitamins: Secondary | ICD-10-CM | POA: Diagnosis present

## 2020-12-16 LAB — VITAMIN B12: Vitamin B-12: 107 pg/mL — ABNORMAL LOW (ref 180–914)

## 2020-12-16 LAB — VITAMIN D 25 HYDROXY (VIT D DEFICIENCY, FRACTURES): Vit D, 25-Hydroxy: 70.03 ng/mL (ref 30–100)

## 2020-12-16 NOTE — Progress Notes (Signed)
Neurology Progress Note  S: Patient states he feels OK. No tremors. No n/v, CP, SOB, or confusion. Says he sleeps well with Primidone.   PT has seen him once and per note: "short step to gait with significant trunk flexion. Activity limited due to fatigue. Decreased strength;Decreased activity tolerance;Decreased balance; Decreased mobility;Decreased knowledge of use of DME; Decreased safety awareness; Decreased knowledge of precautions."  NP called his daughter listed on the chart to ask about Abilify. She is not sure when this was prescribed but thinks it may have been around Christmas of 2021. She is unsure of why it was started. Patient is not on a SSRI, but daughter states he is on Klonopin.  She states she noted patient to be more "out of it" (flat, confused) at Christmas time. His PCP is Dr. Alen Bleacher at [facility].   Daughter is to check with patient's ex wife and see if she has any further information. (Patient still lives with ex wife). DAughter called back and ex wife has a list of patient's medicines that she will bring here this p.m. When asked if Abilify was on the list, she replied yes. When asked if the patient has a history of bipolar disorder, she states he is very private, but she believes he has struggled with that most of his life.   O: Current vital signs: BP 117/87 (BP Location: Right Arm)   Pulse 71   Temp 98.4 F (36.9 C) (Oral)   Resp 20   SpO2 95%  Vital signs in last 24 hours: Temp:  [98.1 F (36.7 C)-98.4 F (36.9 C)] 98.4 F (36.9 C) (04/16 0700) Pulse Rate:  [68-77] 71 (04/16 0700) Resp:  [15-20] 20 (04/16 0700) BP: (110-138)/(75-96) 117/87 (04/16 0700) SpO2:  [93 %-95 %] 95 % (04/16 0700)  GENERAL: Awake, alert in NAD. Flat affect.  HEENT: Normocephalic and atraumatic LUNGS: Normal respiratory effort.  CV: RRR   Ext: warm  NEURO:  Mental Status: AA&Ox3  Speech/Language: speech is slowed with flat phonation. + bradyphrenia. No aphasia or  dysarthria.    Cranial Nerves:  V: Sensation is intact to light touch and symmetrical to face.  VII: Smile is symmetrical. Masked facies. VIII: hearing intact to voice. IX, X: Palate elevates symmetrically. Phonation is normal.  SW:NIOEVOJJ shrug 5/5. XII: tongue is midline without fasciculations. Motor: 5/5 strength to all muscle groups tested.  Tone: is normal and bulk is normal Sensation- Intact to light touch bilaterally. Extinction absent to light touch to DSS.    Coordination: no tremors or clonus. No head tremor.  Proprioception intact.  DTRs: 2+ throughout except 3+ LLE.  Plantars down going.  Gait- deferred  Medications  Current Facility-Administered Medications:  .  acetaminophen (TYLENOL) tablet 650 mg, 650 mg, Oral, Q6H PRN **OR** acetaminophen (TYLENOL) suppository 650 mg, 650 mg, Rectal, Q6H PRN, Rise Patience, MD .  albuterol (VENTOLIN HFA) 108 (90 Base) MCG/ACT inhaler 2 puff, 2 puff, Inhalation, Q6H PRN, Rise Patience, MD .  aspirin EC tablet 325 mg, 325 mg, Oral, Daily, Rise Patience, MD, 325 mg at 12/16/20 0921 .  carvedilol (COREG) tablet 25 mg, 25 mg, Oral, BID WC, Rise Patience, MD, 25 mg at 12/16/20 0920 .  clonazePAM (KLONOPIN) tablet 1 mg, 1 mg, Oral, QHS, Rise Patience, MD, 1 mg at 12/15/20 2140 .  melatonin tablet 10 mg, 10 mg, Oral, QHS, Rise Patience, MD, 10 mg at 12/15/20 2141 .  montelukast (SINGULAIR) tablet 10 mg, 10  mg, Oral, QHS, Rise Patience, MD, 10 mg at 12/15/20 2141 .  primidone (MYSOLINE) tablet 50 mg, 50 mg, Oral, QHS, Rise Patience, MD, 50 mg at 12/15/20 2140 .  spironolactone (ALDACTONE) tablet 12.5 mg, 12.5 mg, Oral, Daily, Rise Patience, MD, 12.5 mg at 12/16/20 0921 .  tamsulosin (FLOMAX) capsule 0.4 mg, 0.4 mg, Oral, QPC supper, Rise Patience, MD, 0.4 mg at 12/15/20 1959  Pertinent Labs UA negative  Imaging  CT Head No evidence of acute infarction, hemorrhage,  hydrocephalus, extra-axial collection or mass lesion/mass effect. Periventricular and deep white matter hypodensity.  CT Cspine 1. No acute intracranial pathology. Small-vessel white matter disease. 2. Soft tissue contusion of the right forehead. 3. No fracture or static subluxation of the cervical spine. 4. Moderate multilevel disc space height loss and osteophytosis. 5. Emphysema in the included lung apices.  MRI lumbar spine 1. In comparison to November 29, 2020, similar severe foraminal stenosis on the right at L2-L3 and L4-L5 and multilevel moderate foraminal stenosis, as detailed above. 2. Similar moderate to severe canal stenosis and severe right subarticular recess stenosis at L4-L5. Additional multilevel right subarticular recess narrowing, as detailed above. 3. Levocurvature, centered at L3.  MRI Tspine  1. Moderate left and mild right foraminal stenosis at T9-T10 and T10-T11. Mild left foraminal stenosis T11-T12. 2. Mild canal stenosis at T9-T10. Small central disc protrusions at T4-T5 and T5-T6 flatten the ventral cord without significant canal stenosis.   Assessment: 75 yo male who presented to ED for gait disturbancewith recent significant decline over 2-3 weeks with a fall. Gait described as shuffling by family. He presented with hyperreflexia and there was concern of an acute spinal process leading to this. However, his spine films are not consistent for an acute process, but patient does have evidence of multilevel disc space height and severe stenosis at multiple levels. Neurology consulted on 12/15/20. His hyperreflexia is not due to acute findings on spinal imaging, however, he does have multiple chronic issues to spine. Abilify has been held since admission as a suspected contributor to his presenting Parkinsonism symptoms. In review of home medications, he takes Primidone and has no history of seizures, so likely prescribed for tremors.   Impression/Plan:  1.  Parkinsonism-continue to hold Abilify, would expect improvement to be gradual over the course of weeks to months.  2. Continue primidone for essential tremor.  3. Gait disturbance-likely related to Parkinsonism. Continue PT.   Daughter to bring home medication list today.   Pt seen by Clance Boll, MSN, APN-BC/Nurse Practitioner/Neuro and later by MD. Note and plan to be edited as needed by MD.  Pager: [phone]   I have seen the patient reviewed the above note.  The patient describes that since he started on Abilify about 4 months ago, he has noticed a gradual increasing slowness to his movements, and finally reached the point of no longer being able to compensate about two or 3 weeks ago.  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  I do not think I would favor starting any particular medication, but would rather hold the Abilify and give it time.  He is on Abilify for depression, and I would avoid all neuroleptics for the time being.  No further recommendations other than PT, OT and he can follow-up with Dr. Carles Collet who is his regular neurologist for essential tremor and a movement disorder specialist. He may need short-term SNF or rehab based on PT recs.   Roland Rack,  MD [facility] [phone]  If 7pm- 7am, please page neurology on call as listed in [place].

## 2020-12-16 NOTE — Progress Notes (Signed)
PROGRESS NOTE    BONNIE ROIG  BMW:[id number] DOB: Aug 04, 1946 DOA: 12/14/2020 PCP: Janine Limbo, PA-C    Brief Narrative:  Patient with history of aortic valve replacement, hypertension, BPH, tobacco abuse, lumbar myelopathy who has experienced worsening lower extremity weakness and difficulty ambulating over the past 5 weeks.  He has had multiple falls and is having a lot of difficulty functioning around his house.  MRI of spine has shown diffuse degeneration but no acute findings.  MRI of the brain shows no stroke.  The patient is noted to have significant hyperreflexia, tremors, bradykinesia concerning for possible Parkinson's or ALS.   Assessment & Plan:   Principal Problem:   Weakness Active Problems:   HTN (hypertension)   BPH (benign prostatic hyperplasia)   S/P aortic valve replacement with bioprosthetic valve   Other emphysema (HCC)   Tremor   Weakness  Increasing weakness with features concerning for parkinsonism and also has increased reflexes-appreciate neurology consult.  Question of some of his parkinsonism is related to Abilify which she had been on at home.  We are holding this medication. Check TFTs, vitamin D, vitamin B12 Physical therapy consult.  Hypertension  Continue carvedilol and spironolactone.  Bioprosthetic aortic valve replacement  appears compensated.  BPH  Continue Flomax.  Lumbar and C-spine radiculopathy No significant changes  Emphysema No acute decompensation noted  Essential tremor On Mysoline   DVT prophylaxis: SCD/Compression stockings Code Status: Full code  Family Communication: Patient at bedside Disposition Plan: To be determined as patient improves with symptoms.  Possible SNF versus CIR placement according to PT.   Consultants:   Neurology  Procedures:  None  Antimicrobials: Anti-infectives (From admission, onward)   None       Subjective: Feels well today.  His major complaint is about amount  of salt is in his diet he really wants to be on a regular diet and not a low-salt diet.  Objective: Vitals:   12/15/20 2300 12/16/20 0400 12/16/20 0700 12/16/20 1100  BP: 138/89 110/75 117/87 105/73  Pulse: 73 73 71 72  Resp: 16 16 20 18   Temp: 98.3 F (36.8 C)  98.4 F (36.9 C) (!) 97.3 F (36.3 C)  TempSrc: Oral  Oral Oral  SpO2: 95% 93% 95% 93%    Intake/Output Summary (Last 24 hours) at 12/16/2020 1332 Last data filed at 12/16/2020 0354 Gross per 24 hour  Intake --  Output 800 ml  Net -800 ml   There were no vitals filed for this visit.  Examination:  General exam: Appears calm and comfortable  Respiratory system: Clear to auscultation. Respiratory effort normal. Cardiovascular system: S1 & S2 heard, RRR.  Gastrointestinal system: Abdomen is nondistended, soft and nontender.  Central nervous system: Alert and oriented. No focal neurological deficits. Extremities: Symmetric, weakness in the lower extremities Skin: No rashes Psychiatry: Judgement and insight appear normal. Mood & affect appropriate.     Data Reviewed: I have personally reviewed following labs and imaging studies  CBC: Recent Labs  Lab 12/14/20 1618 12/15/20 0745  WBC 9.7 8.6  HGB 14.0 14.0  HCT 41.0 40.6  MCV 100.5* 101.2*  PLT 195 272   Basic Metabolic Panel: Recent Labs  Lab 12/14/20 1618 12/15/20 0745  NA 137 135  K 4.2 3.8  CL 103 102  CO2 24 26  GLUCOSE 98 103*  BUN 13 11  CREATININE 0.79 0.74  CALCIUM 8.7* 9.0   GFR: CrCl cannot be calculated (Unknown ideal weight.). Liver Function Tests: Recent Labs  Lab 12/14/20 1618  AST 12*  ALT 9  ALKPHOS 57  BILITOT 0.8  PROT 5.9*  ALBUMIN 3.1*   .  Okay  Recent Results (from the past 240 hour(s))  Resp Panel by RT-PCR (Flu A&B, Covid) Nasopharyngeal Swab     Status: None   Collection Time: 12/14/20  5:49 PM   Specimen: Nasopharyngeal Swab; Nasopharyngeal(NP) swabs in vial transport medium  Result Value Ref Range Status    SARS Coronavirus 2 by RT PCR NEGATIVE NEGATIVE Final    Comment: (NOTE) SARS-CoV-2 target nucleic acids are NOT DETECTED.  The SARS-CoV-2 RNA is generally detectable in upper respiratory specimens during the acute phase of infection. The lowest concentration of SARS-CoV-2 viral copies this assay can detect is 138 copies/mL. A negative result does not preclude SARS-Cov-2 infection and should not be used as the sole basis for treatment or other patient management decisions. A negative result may occur with  improper specimen collection/handling, submission of specimen other than nasopharyngeal swab, presence of viral mutation(s) within the areas targeted by this assay, and inadequate number of viral copies(<138 copies/mL). A negative result must be combined with clinical observations, patient history, and epidemiological information. The expected result is Negative.  Fact Sheet for Patients:  EntrepreneurPulse.com.au  Fact Sheet for Healthcare Providers:  IncredibleEmployment.be  This test is no t yet approved or cleared by the [place] FDA and  has been authorized for detection and/or diagnosis of SARS-CoV-2 by FDA under an Emergency Use Authorization (EUA). This EUA will remain  in effect (meaning this test can be used) for the duration of the COVID-19 declaration under Section 564(b)(1) of the Act, 21 U.S.C.section 360bbb-3(b)(1), unless the authorization is terminated  or revoked sooner.       Influenza A by PCR NEGATIVE NEGATIVE Final   Influenza B by PCR NEGATIVE NEGATIVE Final    Comment: (NOTE) The Xpert Xpress SARS-CoV-2/FLU/RSV plus assay is intended as an aid in the diagnosis of influenza from Nasopharyngeal swab specimens and should not be used as a sole basis for treatment. Nasal washings and aspirates are unacceptable for Xpert Xpress SARS-CoV-2/FLU/RSV testing.  Fact Sheet for  Patients: EntrepreneurPulse.com.au  Fact Sheet for Healthcare Providers: IncredibleEmployment.be  This test is not yet approved or cleared by the [place] FDA and has been authorized for detection and/or diagnosis of SARS-CoV-2 by FDA under an Emergency Use Authorization (EUA). This EUA will remain in effect (meaning this test can be used) for the duration of the COVID-19 declaration under Section 564(b)(1) of the Act, 21 U.S.C. section 360bbb-3(b)(1), unless the authorization is terminated or revoked.  Performed at [facility], [place] [address], [place], [place] [address]       Radiology Studies: CT HEAD WO CONTRAST  Result Date: 12/14/2020 CLINICAL DATA:  Fall, pain EXAM: CT HEAD WITHOUT CONTRAST CT CERVICAL SPINE WITHOUT CONTRAST TECHNIQUE: Multidetector CT imaging of the head and cervical spine was performed following the standard protocol without intravenous contrast. Multiplanar CT image reconstructions of the cervical spine were also generated. COMPARISON:  None. FINDINGS: CT HEAD FINDINGS Brain: No evidence of acute infarction, hemorrhage, hydrocephalus, extra-axial collection or mass lesion/mass effect. Periventricular and deep white matter hypodensity. Vascular: No hyperdense vessel or unexpected calcification. Skull: Normal. Negative for fracture or focal lesion. Sinuses/Orbits: No acute finding. Other: Soft tissue contusion of the right forehead (series 1, image 16). CT CERVICAL SPINE FINDINGS Alignment: Normal. Skull base and vertebrae: No acute fracture. No primary bone lesion or focal pathologic process.  Soft tissues and spinal canal: No prevertebral fluid or swelling. No visible canal hematoma. Disc levels: Moderate multilevel disc space height loss and osteophytosis. Upper chest: Emphysema. Other: None. IMPRESSION: 1. No acute intracranial pathology. Small-vessel white matter disease. 2. Soft tissue contusion of the right forehead.  3. No fracture or static subluxation of the cervical spine. 4. Moderate multilevel disc space height loss and osteophytosis. 5. Emphysema in the included lung apices. Emphysema (ICD10-J43.9). Electronically Signed   By: Eddie Candle M.D.   On: 12/14/2020 17:07   CT CERVICAL SPINE WO CONTRAST  Result Date: 12/14/2020 CLINICAL DATA:  Fall, pain EXAM: CT HEAD WITHOUT CONTRAST CT CERVICAL SPINE WITHOUT CONTRAST TECHNIQUE: Multidetector CT imaging of the head and cervical spine was performed following the standard protocol without intravenous contrast. Multiplanar CT image reconstructions of the cervical spine were also generated. COMPARISON:  None. FINDINGS: CT HEAD FINDINGS Brain: No evidence of acute infarction, hemorrhage, hydrocephalus, extra-axial collection or mass lesion/mass effect. Periventricular and deep white matter hypodensity. Vascular: No hyperdense vessel or unexpected calcification. Skull: Normal. Negative for fracture or focal lesion. Sinuses/Orbits: No acute finding. Other: Soft tissue contusion of the right forehead (series 1, image 16). CT CERVICAL SPINE FINDINGS Alignment: Normal. Skull base and vertebrae: No acute fracture. No primary bone lesion or focal pathologic process. Soft tissues and spinal canal: No prevertebral fluid or swelling. No visible canal hematoma. Disc levels: Moderate multilevel disc space height loss and osteophytosis. Upper chest: Emphysema. Other: None. IMPRESSION: 1. No acute intracranial pathology. Small-vessel white matter disease. 2. Soft tissue contusion of the right forehead. 3. No fracture or static subluxation of the cervical spine. 4. Moderate multilevel disc space height loss and osteophytosis. 5. Emphysema in the included lung apices. Emphysema (ICD10-J43.9). Electronically Signed   By: Eddie Candle M.D.   On: 12/14/2020 17:07   MR BRAIN WO CONTRAST  Result Date: 12/14/2020 CLINICAL DATA:  Acute neuro deficit EXAM: MRI HEAD WITHOUT CONTRAST TECHNIQUE:  Multiplanar, multiecho pulse sequences of the brain and surrounding structures were obtained without intravenous contrast. COMPARISON:  CT head 12/14/2020 FINDINGS: Brain: Cluster of small areas of hyperintensity on diffusion-weighted imaging in the left parietal cortex and white matter. Possible acute or subacute infarct. No definite ADC correlate. Generalized atrophy. Mild to moderate white matter changes with periventricular deep white matter hyperintensities bilaterally. Small chronic infarct left cerebellum. Negative for hemorrhage or mass. Vascular: Normal arterial flow voids. Skull and upper cervical spine: Negative Sinuses/Orbits: Paranasal sinuses clear. Bilateral cataract extraction Other: None IMPRESSION: Several small areas of diffusion hyperintensity in the left parietal white matter and cortex. Possible acute or subacute infarct. Generalized atrophy. Mild to moderate chronic microvascular ischemic changes in the white matter. Electronically Signed   By: Franchot Gallo M.D.   On: 12/14/2020 20:57   MR Cervical Spine Wo Contrast  Result Date: 12/15/2020 CLINICAL DATA:  Bilateral arm and leg weakness for 1-2 months EXAM: MRI CERVICAL SPINE WITHOUT CONTRAST TECHNIQUE: Multiplanar, multisequence MR imaging of the cervical spine was performed. No intravenous contrast was administered. COMPARISON:  MRI cervical spine 06/27/2018 and 10/09/17 FINDINGS: Alignment: Grade 1 anterolisthesis at C3-4 and C4-5. Straightening of normal cervical lordotic curvature. Vertebrae: No fracture, evidence of discitis, or bone lesion. Cord: Normal signal and morphology. Posterior Fossa, vertebral arteries, paraspinal tissues: Cystic focus of the right nasopharynx (12:1), unchanged since 10/09/17. Disc levels: C1-2: Unremarkable. C2-3: Left facet hypertrophy. No disc herniation. There is no spinal canal stenosis. No neural foraminal stenosis. C3-4: Left facet hypertrophy. Small  disc bulge. There is no spinal canal stenosis.  Worsened severe left neural foraminal stenosis. C4-5: Left facet hypertrophy. No disc herniation. There is no spinal canal stenosis. Unchanged severe left neural foraminal stenosis. C5-6: Left facet hypertrophy. Small disc bulge with bilateral uncovertebral osteophytes. There is no spinal canal stenosis. Unchanged severe right and moderate left neural foraminal stenosis. C6-7: Small disc bulge with uncovertebral spurring. There is no spinal canal stenosis. No neural foraminal stenosis. C7-T1: Normal disc space and facet joints. There is no spinal canal stenosis. Mild left neural foraminal stenosis. IMPRESSION: 1. Worsened severe left C3-4 neural foraminal stenosis. 2. Unchanged severe left C4-5 and right C5-6 neural foraminal stenosis. 3. No spinal canal stenosis. Electronically Signed   By: Ulyses Jarred M.D.   On: 12/15/2020 00:48   MR THORACIC SPINE WO CONTRAST  Result Date: 12/15/2020 CLINICAL DATA:  Low and mid back pain.  Neuro deficit EXAM: MRI THORACIC AND LUMBAR SPINE WITHOUT CONTRAST TECHNIQUE: Multiplanar and multiecho pulse sequences of the thoracic and lumbar spine were obtained without intravenous contrast. COMPARISON:  November 29, 2020. FINDINGS: MRI THORACIC SPINE FINDINGS Alignment:  Normal. Vertebrae: Vertebral body heights are maintained. Heterogeneous marrow. No specific evidence of acute fracture, discitis/osteomyelitis, or suspicious bone lesion. Cord:  Normal cord signal. Paraspinal and other soft tissues: Unremarkable Disc levels: Disc bulging with bilateral facet hypertrophy at T9-T10 and T10-T11 results in moderate left and mild right foraminal stenosis. Mild canal stenosis at T9-T10 mild left foraminal stenosis at T11-T12. Small central disc protrusion at T4-T5 and T5-T6 which efface ventral CSF and flatten the ventral cord without significant canal stenosis. Right a centric small disc/osteophyte at T7-T8 partially effaces ventral CSF. MRI LUMBAR SPINE FINDINGS Segmentation:  Standard.  Alignment: Levocurvature centered at L3. Similar grade 1 retrolisthesis of L2 on L3. Vertebrae: Similar heterogeneous bone marrow without focal lesion. Degenerative/discogenic endplate signal changes about the right L4-L5 disc. No specific evidence of acute fracture or discitis/osteomyelitis. Conus medullaris and cauda equina: Conus extends to the level. Conus and cauda equina appear normal. Paraspinal and other soft tissues: Similar cystic area posterior to the right posterior elements. Disc levels: T12-L1: Similar far left lateral/extraforaminal disc protrusion without impingement. No significant canal stenosis. L1-L2: Broad disc bulging and bilateral facet hypertrophy with similar narrowing of the right subarticular recess and similar moderate bilateral foraminal stenosis. L2-L3: Similar large right asymmetric disc bulge with right facet hypertrophy. Similar severe right foraminal stenosis. Similar right subarticular recess narrowing without significant central canal or left foraminal l stenosis. L3-L4: Large right asymmetric disc bulge with right greater than left facet hypertrophy. Similar moderate right foraminal stenosis and right subarticular recess stenosis. No significant central canal stenosis or left foraminal stenosis. L4-L5: Large right asymmetric disc bulge with severe right and moderate left facet hypertrophy. Similar widening of the right facet joint with associated facet joint. Similar moderate to severe canal stenosis, severe right subarticular recess narrowing, and severe right and mild-to-moderate left foraminal stenosis. L5-S1: Left eccentric disc bulge and moderate bilateral facet hypertrophy. Similar moderate left foraminal stenosis. No significant canal or right foraminal stenosis. IMPRESSION: MR LUMBAR SPINE IMPRESSION 1. In comparison to November 29, 2020, similar severe foraminal stenosis on the right at L2-L3 and L4-L5 and multilevel moderate foraminal stenosis, as detailed above. 2.  Similar moderate to severe canal stenosis and severe right subarticular recess stenosis at L4-L5. Additional multilevel right subarticular recess narrowing, as detailed above. 3. Levocurvature, centered at L3. MR THORACIC SPINE IMPRESSION 1. Moderate left and mild right  foraminal stenosis at T9-T10 and T10-T11. Mild left foraminal stenosis T11-T12. 2. Mild canal stenosis at T9-T10. Small central disc protrusions at T4-T5 and T5-T6 flatten the ventral cord without significant canal stenosis. Electronically Signed   By: Margaretha Sheffield MD   On: 12/15/2020 07:02   MR LUMBAR SPINE WO CONTRAST  Result Date: 12/15/2020 CLINICAL DATA:  Low and mid back pain.  Neuro deficit EXAM: MRI THORACIC AND LUMBAR SPINE WITHOUT CONTRAST TECHNIQUE: Multiplanar and multiecho pulse sequences of the thoracic and lumbar spine were obtained without intravenous contrast. COMPARISON:  November 29, 2020. FINDINGS: MRI THORACIC SPINE FINDINGS Alignment:  Normal. Vertebrae: Vertebral body heights are maintained. Heterogeneous marrow. No specific evidence of acute fracture, discitis/osteomyelitis, or suspicious bone lesion. Cord:  Normal cord signal. Paraspinal and other soft tissues: Unremarkable Disc levels: Disc bulging with bilateral facet hypertrophy at T9-T10 and T10-T11 results in moderate left and mild right foraminal stenosis. Mild canal stenosis at T9-T10 mild left foraminal stenosis at T11-T12. Small central disc protrusion at T4-T5 and T5-T6 which efface ventral CSF and flatten the ventral cord without significant canal stenosis. Right a centric small disc/osteophyte at T7-T8 partially effaces ventral CSF. MRI LUMBAR SPINE FINDINGS Segmentation:  Standard. Alignment: Levocurvature centered at L3. Similar grade 1 retrolisthesis of L2 on L3. Vertebrae: Similar heterogeneous bone marrow without focal lesion. Degenerative/discogenic endplate signal changes about the right L4-L5 disc. No specific evidence of acute fracture or  discitis/osteomyelitis. Conus medullaris and cauda equina: Conus extends to the level. Conus and cauda equina appear normal. Paraspinal and other soft tissues: Similar cystic area posterior to the right posterior elements. Disc levels: T12-L1: Similar far left lateral/extraforaminal disc protrusion without impingement. No significant canal stenosis. L1-L2: Broad disc bulging and bilateral facet hypertrophy with similar narrowing of the right subarticular recess and similar moderate bilateral foraminal stenosis. L2-L3: Similar large right asymmetric disc bulge with right facet hypertrophy. Similar severe right foraminal stenosis. Similar right subarticular recess narrowing without significant central canal or left foraminal l stenosis. L3-L4: Large right asymmetric disc bulge with right greater than left facet hypertrophy. Similar moderate right foraminal stenosis and right subarticular recess stenosis. No significant central canal stenosis or left foraminal stenosis. L4-L5: Large right asymmetric disc bulge with severe right and moderate left facet hypertrophy. Similar widening of the right facet joint with associated facet joint. Similar moderate to severe canal stenosis, severe right subarticular recess narrowing, and severe right and mild-to-moderate left foraminal stenosis. L5-S1: Left eccentric disc bulge and moderate bilateral facet hypertrophy. Similar moderate left foraminal stenosis. No significant canal or right foraminal stenosis. IMPRESSION: MR LUMBAR SPINE IMPRESSION 1. In comparison to November 29, 2020, similar severe foraminal stenosis on the right at L2-L3 and L4-L5 and multilevel moderate foraminal stenosis, as detailed above. 2. Similar moderate to severe canal stenosis and severe right subarticular recess stenosis at L4-L5. Additional multilevel right subarticular recess narrowing, as detailed above. 3. Levocurvature, centered at L3. MR THORACIC SPINE IMPRESSION 1. Moderate left and mild right  foraminal stenosis at T9-T10 and T10-T11. Mild left foraminal stenosis T11-T12. 2. Mild canal stenosis at T9-T10. Small central disc protrusions at T4-T5 and T5-T6 flatten the ventral cord without significant canal stenosis. Electronically Signed   By: Margaretha Sheffield MD   On: 12/15/2020 07:02   DG Chest Port 1 View  Result Date: 12/14/2020 CLINICAL DATA:  Weakness. EXAM: PORTABLE CHEST 1 VIEW COMPARISON:  June 08, 2018. FINDINGS: Stable cardiomediastinal silhouette. Status post aortic root repair. No pneumothorax or pleural effusion is  noted. Lungs are clear. Bony thorax is unremarkable. IMPRESSION: No acute cardiopulmonary abnormality seen. Aortic Atherosclerosis (ICD10-I70.0). Electronically Signed   By: Marijo Conception M.D.   On: 12/14/2020 16:37     Scheduled Meds: . aspirin  325 mg Oral Daily  . carvedilol  25 mg Oral BID WC  . clonazePAM  1 mg Oral QHS  . melatonin  10 mg Oral QHS  . montelukast  10 mg Oral QHS  . primidone  50 mg Oral QHS  . spironolactone  12.5 mg Oral Daily  . tamsulosin  0.4 mg Oral QPC supper   Continuous Infusions:   LOS: 0 days    Donnamae Jude, MD 12/16/2020 1:32 PM [phone] [facility] If 7PM-7AM, please contact night-coverage 12/16/2020, 1:32 PM

## 2020-12-16 NOTE — Progress Notes (Signed)
PT EVALUATION - Late entry for Ryan L, PT Pt with limited assist at home and needing assist for mobility today.  Feel that a SNF for Rehab is warranted prior to D/C home.  Will follow acutely and progress pt as able.   12/15/20 1030  PT Visit Information  Last PT Received On 12/15/20  Assistance Needed +1  History of Present Illness 75 y.o. male presents to [facility] ED on 12/14/2020 with reports of worsening LE weakness and difficulty ambulating. Pt has been followed for lumbar myelopathy and had a fall in shower 5 weeks ago with worsening of symptoms, multiple falls since. 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  Precautions  Precautions Fall  Precaution Comments parkinsons-like presentation  Restrictions  Weight Bearing Restrictions No  Home Living  Family/patient expects to be discharged to: Private residence  Living Arrangements Spouse/significant other ("lady friend")  Available Help at Discharge Friend(s);Available 24 hours/day (limited physical assistance from significant other, son PRN)  Type of [place] entrance  [place] One level  Bathroom Shower/Tub Tub/shower unit;[place] - 4 wheels;Shower seat;Grab bars - toilet;Grab bars - tub/shower (3 point cane)  Prior Function  Level of Independence Independent  Comments pt had been ambulating independently until 5 weeks ago when he sustained a fall, since fall he has had progressive weakness, ambulating with a walker for the last month and falling out of the bed multiple times  Communication  Communication No difficulties  Pain Assessment  Pain Assessment No/denies pain  Cognition   Arousal/Alertness Awake/alert  Behavior During Therapy Flat affect (more appropriate by end of session, may be due to fatigue)  Overall Cognitive Status Within Functional Limits for tasks assessed  Upper Extremity Assessment  Upper Extremity Assessment Generalized weakness  Lower Extremity Assessment  Lower Extremity Assessment Generalized weakness  Cervical / Trunk Assessment  Cervical / Trunk Assessment Kyphotic  Bed Mobility  Overal bed mobility Needs Assistance  Bed Mobility Supine to Sit;Sit to Supine  Supine to sit Mod assist  Sit to supine Min assist  Transfers  Overall transfer level Needs assistance  Equipment used Rolling walker (2 wheeled)  Transfers Sit to/from Stand  Sit to Stand Min assist  General transfer comment assist to power up into standing  Ambulation/Gait  Ambulation/Gait assistance Min guard  Gait Distance (Feet) 15 Feet  Assistive device Rolling walker (2 wheeled)  Gait Pattern/deviations Step-to pattern;Trunk flexed  General Gait Details pt with short step-to gait, significant trunk flexion with walker far out in front of BOS, PT provides verbal cues to encourage more upright posture while maintaining RW closer to BOS. Significantly slowed gait speed, pt appears to have some difficulty with turns, possibly freezing.  Gait velocity reduced  Gait velocity interpretation <1.31 ft/sec, indicative of household ambulator  Balance  Overall balance assessment Needs assistance  Sitting-balance support Single extremity supported;Feet supported  Sitting balance-Leahy Scale Poor  Sitting balance - Comments reliant on UE support of minG  Standing balance support Single extremity supported;Bilateral upper extremity supported  Standing balance-Leahy Scale Poor  Standing balance comment reliant on UE support of RW  General Comments  General comments (skin integrity, edema, etc.) VSS on RA  PT - End of Session  Activity Tolerance Patient  limited by fatigue  Patient  left in bed;with call bell/phone within reach  Nurse Communication Mobility status  PT Assessment  PT Recommendation/Assessment Patient needs continued PT services  PT Visit Diagnosis Unsteadiness on feet (R26.81);Other abnormalities of gait and mobility (R26.89);Muscle weakness (generalized) (M62.81);Other symptoms and signs involving the nervous system (R29.898)  PT Problem List Decreased strength;Decreased activity tolerance;Decreased balance;Decreased mobility;Decreased knowledge of use of DME;Decreased safety awareness;Decreased knowledge of precautions  PT Plan  PT Frequency (ACUTE ONLY) Min 2X/week  PT Treatment/Interventions (ACUTE ONLY) DME instruction;Gait training;Functional mobility training;Therapeutic activities;Balance training;Therapeutic exercise;Neuromuscular re-education;Patient/family education  AM-PAC PT "6 Clicks" Mobility Outcome Measure (Version 2)  Help needed turning from your back to your side while in a flat bed without using bedrails? 3  Help needed moving from lying on your back to sitting on the side of a flat bed without using bedrails? 2  Help needed moving to and from a bed to a chair (including a wheelchair)? 3  Help needed standing up from a chair using your arms (e.g., wheelchair or bedside chair)? 3  Help needed to walk in hospital room? 3  Help needed climbing 3-5 steps with a railing?  2  6 Click Score 16  Consider Recommendation of Discharge To: Home with [facility]  PT Recommendation  Follow Up Recommendations SNF;Supervision/Assistance - 24 hour  PT equipment Wheelchair (measurements PT);Hospital bed  Acute Rehab PT Goals  Patient Stated Goal to improve mobility quality and restore independence  PT Goal Formulation With patient  Time For Goal Achievement 12/29/20  Potential to Achieve Goals Good  PT Time Calculation  PT Start Time (ACUTE ONLY) 1010  PT Stop Time (ACUTE ONLY) 1030  PT Time Calculation (min) (ACUTE ONLY) 20 min  PT General Charges  $$  ACUTE PT VISIT 1 Visit  PT Evaluation  $PT Eval Low Complexity 1 Low  Dejon Lukas M,PT Acute Rehab Services [phone] (pager)

## 2020-12-16 NOTE — Plan of Care (Signed)
Neuurology:  The daughter arrived with medication list. Abilify is on there and patient states he has been on it x 6 months prescribed by his PCP. Patient's son states patient was diagnosed with Bipolar in the 55s. The patient is very private about this and does not share much.   Therefore, we need to continue to hold Abilify. Asked hospitalist to get psychiatry involved to weigh in on an alternative medication that is not neuroleptic.  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 No need to start Sinemet at this time.   His symptoms may not resolved for weeks to months.   Clance Boll, NP Neurology

## 2020-12-16 NOTE — Progress Notes (Signed)
Patient ID: Travis Reese, male   DOB: 08/11/1946, 74 y.o.   MRN: [id number]  PROGRESS NOTE    Travis Reese  BOF:[id number] DOB: Jan 31, 1946 DOA: 12/14/2020 PCP: Janine Limbo, PA-C    Brief Narrative:  Patient with history of aortic valve replacement, hypertension, BPH, tobacco abuse, lumbar myelopathy who has experienced worsening lower extremity weakness and difficulty ambulating over the past 5 weeks. He has had multiple falls and is having a lot of difficulty functioning around his house. MRI of spine has shown diffuse degeneration but no acute findings. MRI of the brain shows no stroke. The patient is noted to have significant hyperreflexia, tremors, bradykinesia concerning for possible Parkinson's or ALS.   Assessment & Plan:   Principal Problem:   Weakness Active Problems:   HTN (hypertension)   BPH (benign prostatic hyperplasia)   S/P aortic valve replacement with bioprosthetic valve   Other emphysema (HCC)  Weakness Increasing weakness with features concerning for parkinsonism and also has increased reflexes-appreciate neurology consult. They suspect that his Abilify has caused this. They recommend a Psych consult--page placed x 2, thus far, no response for alternative mood stabilizer  We are holding his Abilify. Check TFTs, vitamin D, vitamin B12 Physical therapy consult.  Hypertension Continuecarvedilol and spironolactone.  Bioprosthetic aortic valve replacement appears compensated.  BPH ContinueFlomax.  Lumbar and C-spine radiculopathy No significant changes but diffuse chronic degenerative changes  Emphysema No acute decompensation noted  Essential tremor On Mysoline   DVT prophylaxis: SCD/Compression stockings Code Status: Full code  Family Communication: Patient at bedside Disposition Plan: To be determined as patient improves. Possible SNF vs CIR placement according to PT.  Changed to inpatient due to unsafe DC  plan   Consultants:   Neurology  Psych attempted x 2 on 4/16  Procedures:  none  Antimicrobials: Anti-infectives (From admission, onward)   None       Subjective: Feels ok today, less weak  Objective: Vitals:   12/15/20 1800 12/15/20 2300 12/16/20 0400 12/16/20 0700  BP: (!) 127/94 138/89 110/75 117/87  Pulse: 73 73 73 71  Resp:  16 16 20   Temp: 98.1 F (36.7 C) 98.3 F (36.8 C)  98.4 F (36.9 C)  TempSrc: Oral Oral  Oral  SpO2:  95% 93% 95%    Intake/Output Summary (Last 24 hours) at 12/16/2020 1017 Last data filed at 12/16/2020 0354 Gross per 24 hour  Intake --  Output 800 ml  Net -800 ml   There were no vitals filed for this visit.  Examination:  General exam: Appears calm and comfortable  Respiratory system: Clear to auscultation. Respiratory effort normal. Cardiovascular system: S1 & S2 heard, RRR.  Gastrointestinal system: Abdomen is nondistended, soft and nontender.  Central nervous system: Alert and oriented. LE weakness, slight tremor Extremities: Symmetric  Skin: No rashes Psychiatry: Judgement and insight appear normal. Mood & affect appropriate.     Data Reviewed: I have personally reviewed following labs and imaging studies  CBC: Recent Labs  Lab 12/14/20 1618 12/15/20 0745  WBC 9.7 8.6  HGB 14.0 14.0  HCT 41.0 40.6  MCV 100.5* 101.2*  PLT 195 778   Basic Metabolic Panel: Recent Labs  Lab 12/14/20 1618 12/15/20 0745  NA 137 135  K 4.2 3.8  CL 103 102  CO2 24 26  GLUCOSE 98 103*  BUN 13 11  CREATININE 0.79 0.74  CALCIUM 8.7* 9.0   GFR: CrCl cannot be calculated (Unknown ideal weight.). Liver Function Tests: Recent Labs  Lab  12/14/20 1618  AST 12*  ALT 9  ALKPHOS 57  BILITOT 0.8  PROT 5.9*  ALBUMIN 3.1*    Sepsis Labs:   Recent Results (from the past 240 hour(s))  Resp Panel by RT-PCR (Flu A&B, Covid) Nasopharyngeal Swab     Status: None   Collection Time: 12/14/20  5:49 PM   Specimen: Nasopharyngeal  Swab; Nasopharyngeal(NP) swabs in vial transport medium  Result Value Ref Range Status   SARS Coronavirus 2 by RT PCR NEGATIVE NEGATIVE Final    Comment: (NOTE) SARS-CoV-2 target nucleic acids are NOT DETECTED.  The SARS-CoV-2 RNA is generally detectable in upper respiratory specimens during the acute phase of infection. The lowest concentration of SARS-CoV-2 viral copies this assay can detect is 138 copies/mL. A negative result does not preclude SARS-Cov-2 infection and should not be used as the sole basis for treatment or other patient management decisions. A negative result may occur with  improper specimen collection/handling, submission of specimen other than nasopharyngeal swab, presence of viral mutation(s) within the areas targeted by this assay, and inadequate number of viral copies(<138 copies/mL). A negative result must be combined with clinical observations, patient history, and epidemiological information. The expected result is Negative.  Fact Sheet for Patients:  EntrepreneurPulse.com.au  Fact Sheet for Healthcare Providers:  IncredibleEmployment.be  This test is no t yet approved or cleared by the [place] FDA and  has been authorized for detection and/or diagnosis of SARS-CoV-2 by FDA under an Emergency Use Authorization (EUA). This EUA will remain  in effect (meaning this test can be used) for the duration of the COVID-19 declaration under Section 564(b)(1) of the Act, 21 U.S.C.section 360bbb-3(b)(1), unless the authorization is terminated  or revoked sooner.       Influenza A by PCR NEGATIVE NEGATIVE Final   Influenza B by PCR NEGATIVE NEGATIVE Final    Comment: (NOTE) The Xpert Xpress SARS-CoV-2/FLU/RSV plus assay is intended as an aid in the diagnosis of influenza from Nasopharyngeal swab specimens and should not be used as a sole basis for treatment. Nasal washings and aspirates are unacceptable for Xpert Xpress  SARS-CoV-2/FLU/RSV testing.  Fact Sheet for Patients: EntrepreneurPulse.com.au  Fact Sheet for Healthcare Providers: IncredibleEmployment.be  This test is not yet approved or cleared by the [place] FDA and has been authorized for detection and/or diagnosis of SARS-CoV-2 by FDA under an Emergency Use Authorization (EUA). This EUA will remain in effect (meaning this test can be used) for the duration of the COVID-19 declaration under Section 564(b)(1) of the Act, 21 U.S.C. section 360bbb-3(b)(1), unless the authorization is terminated or revoked.  Performed at [facility], [place] [address] [place], [place] [address]       Radiology Studies: CT HEAD WO CONTRAST  Result Date: 12/14/2020 CLINICAL DATA:  Fall, pain EXAM: CT HEAD WITHOUT CONTRAST CT CERVICAL SPINE WITHOUT CONTRAST TECHNIQUE: Multidetector CT imaging of the head and cervical spine was performed following the standard protocol without intravenous contrast. Multiplanar CT image reconstructions of the cervical spine were also generated. COMPARISON:  None. FINDINGS: CT HEAD FINDINGS Brain: No evidence of acute infarction, hemorrhage, hydrocephalus, extra-axial collection or mass lesion/mass effect. Periventricular and deep white matter hypodensity. Vascular: No hyperdense vessel or unexpected calcification. Skull: Normal. Negative for fracture or focal lesion. Sinuses/Orbits: No acute finding. Other: Soft tissue contusion of the right forehead (series 1, image 16). CT CERVICAL SPINE FINDINGS Alignment: Normal. Skull base and vertebrae: No acute fracture. No primary bone lesion or focal pathologic process.  Soft tissues and spinal canal: No prevertebral fluid or swelling. No visible canal hematoma. Disc levels: Moderate multilevel disc space height loss and osteophytosis. Upper chest: Emphysema. Other: None. IMPRESSION: 1. No acute intracranial pathology. Small-vessel white matter disease.  2. Soft tissue contusion of the right forehead. 3. No fracture or static subluxation of the cervical spine. 4. Moderate multilevel disc space height loss and osteophytosis. 5. Emphysema in the included lung apices. Emphysema (ICD10-J43.9). Electronically Signed   By: Eddie Candle M.D.   On: 12/14/2020 17:07   CT CERVICAL SPINE WO CONTRAST  Result Date: 12/14/2020 CLINICAL DATA:  Fall, pain EXAM: CT HEAD WITHOUT CONTRAST CT CERVICAL SPINE WITHOUT CONTRAST TECHNIQUE: Multidetector CT imaging of the head and cervical spine was performed following the standard protocol without intravenous contrast. Multiplanar CT image reconstructions of the cervical spine were also generated. COMPARISON:  None. FINDINGS: CT HEAD FINDINGS Brain: No evidence of acute infarction, hemorrhage, hydrocephalus, extra-axial collection or mass lesion/mass effect. Periventricular and deep white matter hypodensity. Vascular: No hyperdense vessel or unexpected calcification. Skull: Normal. Negative for fracture or focal lesion. Sinuses/Orbits: No acute finding. Other: Soft tissue contusion of the right forehead (series 1, image 16). CT CERVICAL SPINE FINDINGS Alignment: Normal. Skull base and vertebrae: No acute fracture. No primary bone lesion or focal pathologic process. Soft tissues and spinal canal: No prevertebral fluid or swelling. No visible canal hematoma. Disc levels: Moderate multilevel disc space height loss and osteophytosis. Upper chest: Emphysema. Other: None. IMPRESSION: 1. No acute intracranial pathology. Small-vessel white matter disease. 2. Soft tissue contusion of the right forehead. 3. No fracture or static subluxation of the cervical spine. 4. Moderate multilevel disc space height loss and osteophytosis. 5. Emphysema in the included lung apices. Emphysema (ICD10-J43.9). Electronically Signed   By: Eddie Candle M.D.   On: 12/14/2020 17:07   MR BRAIN WO CONTRAST  Result Date: 12/14/2020 CLINICAL DATA:  Acute neuro deficit  EXAM: MRI HEAD WITHOUT CONTRAST TECHNIQUE: Multiplanar, multiecho pulse sequences of the brain and surrounding structures were obtained without intravenous contrast. COMPARISON:  CT head 12/14/2020 FINDINGS: Brain: Cluster of small areas of hyperintensity on diffusion-weighted imaging in the left parietal cortex and white matter. Possible acute or subacute infarct. No definite ADC correlate. Generalized atrophy. Mild to moderate white matter changes with periventricular deep white matter hyperintensities bilaterally. Small chronic infarct left cerebellum. Negative for hemorrhage or mass. Vascular: Normal arterial flow voids. Skull and upper cervical spine: Negative Sinuses/Orbits: Paranasal sinuses clear. Bilateral cataract extraction Other: None IMPRESSION: Several small areas of diffusion hyperintensity in the left parietal white matter and cortex. Possible acute or subacute infarct. Generalized atrophy. Mild to moderate chronic microvascular ischemic changes in the white matter. Electronically Signed   By: Franchot Gallo M.D.   On: 12/14/2020 20:57   MR Cervical Spine Wo Contrast  Result Date: 12/15/2020 CLINICAL DATA:  Bilateral arm and leg weakness for 1-2 months EXAM: MRI CERVICAL SPINE WITHOUT CONTRAST TECHNIQUE: Multiplanar, multisequence MR imaging of the cervical spine was performed. No intravenous contrast was administered. COMPARISON:  MRI cervical spine 06/27/2018 and 10/09/17 FINDINGS: Alignment: Grade 1 anterolisthesis at C3-4 and C4-5. Straightening of normal cervical lordotic curvature. Vertebrae: No fracture, evidence of discitis, or bone lesion. Cord: Normal signal and morphology. Posterior Fossa, vertebral arteries, paraspinal tissues: Cystic focus of the right nasopharynx (12:1), unchanged since 10/09/17. Disc levels: C1-2: Unremarkable. C2-3: Left facet hypertrophy. No disc herniation. There is no spinal canal stenosis. No neural foraminal stenosis. C3-4: Left facet hypertrophy. Small  disc  bulge. There is no spinal canal stenosis. Worsened severe left neural foraminal stenosis. C4-5: Left facet hypertrophy. No disc herniation. There is no spinal canal stenosis. Unchanged severe left neural foraminal stenosis. C5-6: Left facet hypertrophy. Small disc bulge with bilateral uncovertebral osteophytes. There is no spinal canal stenosis. Unchanged severe right and moderate left neural foraminal stenosis. C6-7: Small disc bulge with uncovertebral spurring. There is no spinal canal stenosis. No neural foraminal stenosis. C7-T1: Normal disc space and facet joints. There is no spinal canal stenosis. Mild left neural foraminal stenosis. IMPRESSION: 1. Worsened severe left C3-4 neural foraminal stenosis. 2. Unchanged severe left C4-5 and right C5-6 neural foraminal stenosis. 3. No spinal canal stenosis. Electronically Signed   By: Ulyses Jarred M.D.   On: 12/15/2020 00:48   MR THORACIC SPINE WO CONTRAST  Result Date: 12/15/2020 CLINICAL DATA:  Low and mid back pain.  Neuro deficit EXAM: MRI THORACIC AND LUMBAR SPINE WITHOUT CONTRAST TECHNIQUE: Multiplanar and multiecho pulse sequences of the thoracic and lumbar spine were obtained without intravenous contrast. COMPARISON:  November 29, 2020. FINDINGS: MRI THORACIC SPINE FINDINGS Alignment:  Normal. Vertebrae: Vertebral body heights are maintained. Heterogeneous marrow. No specific evidence of acute fracture, discitis/osteomyelitis, or suspicious bone lesion. Cord:  Normal cord signal. Paraspinal and other soft tissues: Unremarkable Disc levels: Disc bulging with bilateral facet hypertrophy at T9-T10 and T10-T11 results in moderate left and mild right foraminal stenosis. Mild canal stenosis at T9-T10 mild left foraminal stenosis at T11-T12. Small central disc protrusion at T4-T5 and T5-T6 which efface ventral CSF and flatten the ventral cord without significant canal stenosis. Right a centric small disc/osteophyte at T7-T8 partially effaces ventral CSF. MRI LUMBAR  SPINE FINDINGS Segmentation:  Standard. Alignment: Levocurvature centered at L3. Similar grade 1 retrolisthesis of L2 on L3. Vertebrae: Similar heterogeneous bone marrow without focal lesion. Degenerative/discogenic endplate signal changes about the right L4-L5 disc. No specific evidence of acute fracture or discitis/osteomyelitis. Conus medullaris and cauda equina: Conus extends to the level. Conus and cauda equina appear normal. Paraspinal and other soft tissues: Similar cystic area posterior to the right posterior elements. Disc levels: T12-L1: Similar far left lateral/extraforaminal disc protrusion without impingement. No significant canal stenosis. L1-L2: Broad disc bulging and bilateral facet hypertrophy with similar narrowing of the right subarticular recess and similar moderate bilateral foraminal stenosis. L2-L3: Similar large right asymmetric disc bulge with right facet hypertrophy. Similar severe right foraminal stenosis. Similar right subarticular recess narrowing without significant central canal or left foraminal l stenosis. L3-L4: Large right asymmetric disc bulge with right greater than left facet hypertrophy. Similar moderate right foraminal stenosis and right subarticular recess stenosis. No significant central canal stenosis or left foraminal stenosis. L4-L5: Large right asymmetric disc bulge with severe right and moderate left facet hypertrophy. Similar widening of the right facet joint with associated facet joint. Similar moderate to severe canal stenosis, severe right subarticular recess narrowing, and severe right and mild-to-moderate left foraminal stenosis. L5-S1: Left eccentric disc bulge and moderate bilateral facet hypertrophy. Similar moderate left foraminal stenosis. No significant canal or right foraminal stenosis. IMPRESSION: MR LUMBAR SPINE IMPRESSION 1. In comparison to November 29, 2020, similar severe foraminal stenosis on the right at L2-L3 and L4-L5 and multilevel moderate  foraminal stenosis, as detailed above. 2. Similar moderate to severe canal stenosis and severe right subarticular recess stenosis at L4-L5. Additional multilevel right subarticular recess narrowing, as detailed above. 3. Levocurvature, centered at L3. MR THORACIC SPINE IMPRESSION 1. Moderate left and mild right  foraminal stenosis at T9-T10 and T10-T11. Mild left foraminal stenosis T11-T12. 2. Mild canal stenosis at T9-T10. Small central disc protrusions at T4-T5 and T5-T6 flatten the ventral cord without significant canal stenosis. Electronically Signed   By: Margaretha Sheffield MD   On: 12/15/2020 07:02   MR LUMBAR SPINE WO CONTRAST  Result Date: 12/15/2020 CLINICAL DATA:  Low and mid back pain.  Neuro deficit EXAM: MRI THORACIC AND LUMBAR SPINE WITHOUT CONTRAST TECHNIQUE: Multiplanar and multiecho pulse sequences of the thoracic and lumbar spine were obtained without intravenous contrast. COMPARISON:  November 29, 2020. FINDINGS: MRI THORACIC SPINE FINDINGS Alignment:  Normal. Vertebrae: Vertebral body heights are maintained. Heterogeneous marrow. No specific evidence of acute fracture, discitis/osteomyelitis, or suspicious bone lesion. Cord:  Normal cord signal. Paraspinal and other soft tissues: Unremarkable Disc levels: Disc bulging with bilateral facet hypertrophy at T9-T10 and T10-T11 results in moderate left and mild right foraminal stenosis. Mild canal stenosis at T9-T10 mild left foraminal stenosis at T11-T12. Small central disc protrusion at T4-T5 and T5-T6 which efface ventral CSF and flatten the ventral cord without significant canal stenosis. Right a centric small disc/osteophyte at T7-T8 partially effaces ventral CSF. MRI LUMBAR SPINE FINDINGS Segmentation:  Standard. Alignment: Levocurvature centered at L3. Similar grade 1 retrolisthesis of L2 on L3. Vertebrae: Similar heterogeneous bone marrow without focal lesion. Degenerative/discogenic endplate signal changes about the right L4-L5 disc. No  specific evidence of acute fracture or discitis/osteomyelitis. Conus medullaris and cauda equina: Conus extends to the level. Conus and cauda equina appear normal. Paraspinal and other soft tissues: Similar cystic area posterior to the right posterior elements. Disc levels: T12-L1: Similar far left lateral/extraforaminal disc protrusion without impingement. No significant canal stenosis. L1-L2: Broad disc bulging and bilateral facet hypertrophy with similar narrowing of the right subarticular recess and similar moderate bilateral foraminal stenosis. L2-L3: Similar large right asymmetric disc bulge with right facet hypertrophy. Similar severe right foraminal stenosis. Similar right subarticular recess narrowing without significant central canal or left foraminal l stenosis. L3-L4: Large right asymmetric disc bulge with right greater than left facet hypertrophy. Similar moderate right foraminal stenosis and right subarticular recess stenosis. No significant central canal stenosis or left foraminal stenosis. L4-L5: Large right asymmetric disc bulge with severe right and moderate left facet hypertrophy. Similar widening of the right facet joint with associated facet joint. Similar moderate to severe canal stenosis, severe right subarticular recess narrowing, and severe right and mild-to-moderate left foraminal stenosis. L5-S1: Left eccentric disc bulge and moderate bilateral facet hypertrophy. Similar moderate left foraminal stenosis. No significant canal or right foraminal stenosis. IMPRESSION: MR LUMBAR SPINE IMPRESSION 1. In comparison to November 29, 2020, similar severe foraminal stenosis on the right at L2-L3 and L4-L5 and multilevel moderate foraminal stenosis, as detailed above. 2. Similar moderate to severe canal stenosis and severe right subarticular recess stenosis at L4-L5. Additional multilevel right subarticular recess narrowing, as detailed above. 3. Levocurvature, centered at L3. MR THORACIC SPINE  IMPRESSION 1. Moderate left and mild right foraminal stenosis at T9-T10 and T10-T11. Mild left foraminal stenosis T11-T12. 2. Mild canal stenosis at T9-T10. Small central disc protrusions at T4-T5 and T5-T6 flatten the ventral cord without significant canal stenosis. Electronically Signed   By: Margaretha Sheffield MD   On: 12/15/2020 07:02   DG Chest Port 1 View  Result Date: 12/14/2020 CLINICAL DATA:  Weakness. EXAM: PORTABLE CHEST 1 VIEW COMPARISON:  June 08, 2018. FINDINGS: Stable cardiomediastinal silhouette. Status post aortic root repair. No pneumothorax or pleural effusion is  noted. Lungs are clear. Bony thorax is unremarkable. IMPRESSION: No acute cardiopulmonary abnormality seen. Aortic Atherosclerosis (ICD10-I70.0). Electronically Signed   By: Marijo Conception M.D.   On: 12/14/2020 16:37     Scheduled Meds: . aspirin  325 mg Oral Daily  . carvedilol  25 mg Oral BID WC  . clonazePAM  1 mg Oral QHS  . melatonin  10 mg Oral QHS  . montelukast  10 mg Oral QHS  . primidone  50 mg Oral QHS  . spironolactone  12.5 mg Oral Daily  . tamsulosin  0.4 mg Oral QPC supper   Continuous Infusions:   LOS: 0 days    Donnamae Jude, MD 12/16/2020 10:17 AM [phone] [facility] If 7PM-7AM, please contact night-coverage 12/16/2020, 10:17 AM

## 2020-12-16 NOTE — NC FL2 (Signed)
[place] LEVEL OF CARE SCREENING TOOL     IDENTIFICATION  Patient Name: Travis Reese Birthdate: Sep 10, 1945 Sex: male Admission Date (Current Location): 12/14/2020  [facility] and [place] Number:      Facility and Address:  [facility], [place] [address] [place], [place] [address]      Provider Number: [id number]  Attending Physician Name and Address:  Donnamae Jude, MD  Relative Name and Phone Number:  Ammie Ferrier, daughter, [phone]    Current Level of Care:   Recommended Level of Care: [place] Prior Approval Number:    Date Approved/Denied:   PASRR Number:    Discharge Plan: SNF    Current Diagnoses: Patient Active Problem List   Diagnosis Date Noted  . Tremor   . Weakness 12/15/2020  . S/P aortic valve replacement with bioprosthetic valve 05/01/2018  . HTN (hypertension) 04/19/2018  . Acute CHF (congestive heart failure) ([place]) 04/19/2018  . BPH (benign prostatic hyperplasia) 04/19/2018  . Anxiety 04/19/2018  . Cellulitis of face 10/15/2016  . Mixed hyperlipidemia 09/23/2016  . Other fatigue 07/23/2016  . Recurrent sinusitis 07/23/2016  . Other emphysema ([place]) 07/11/2016  . Restless leg syndrome 07/10/2016  . Plantar wart of left foot 01/05/2016  . Prolapsed internal hemorrhoids, grade 3 04/19/2015    Orientation RESPIRATION BLADDER Height & Weight     Self,Time,Situation,Place  Normal Continent,External catheter Weight:   Height:     BEHAVIORAL SYMPTOMS/MOOD NEUROLOGICAL BOWEL NUTRITION STATUS      Continent Diet (See DC summary)  AMBULATORY STATUS COMMUNICATION OF NEEDS Skin   Limited Assist Verbally Skin abrasions (Abrasion on R elbow w/ foam dressing)                       Personal Care Assistance Level of Assistance  Bathing,Feeding,Dressing Bathing Assistance: Limited assistance Feeding assistance: Independent Dressing Assistance: Limited assistance     Functional  Limitations Info  Sight,Hearing,Speech Sight Info: Adequate Hearing Info: Adequate Speech Info: Adequate    SPECIAL CARE FACTORS FREQUENCY  PT (By licensed PT),OT (By licensed OT)     PT Frequency: 5x week OT Frequency: 5x week            Contractures Contractures Info: Not present    Additional Factors Info  Code Status,Allergies,Psychotropic Code Status Info: Full Allergies Info: NKA Psychotropic Info: Clonzepam (Klonopin)         Current Medications (12/16/2020):  This is the current hospital active medication list Current Facility-Administered Medications  Medication Dose Route Frequency Provider Last Rate Last Admin  . acetaminophen (TYLENOL) tablet 650 mg  650 mg Oral Q6H PRN Rise Patience, MD       Or  . acetaminophen (TYLENOL) suppository 650 mg  650 mg Rectal Q6H PRN Rise Patience, MD      . albuterol (VENTOLIN HFA) 108 (90 Base) MCG/ACT inhaler 2 puff  2 puff Inhalation Q6H PRN Rise Patience, MD      . aspirin EC tablet 325 mg  325 mg Oral Daily Rise Patience, MD   325 mg at 12/16/20 6010  . carvedilol (COREG) tablet 25 mg  25 mg Oral BID WC Rise Patience, MD   25 mg at 12/16/20 0920  . clonazePAM (KLONOPIN) tablet 1 mg  1 mg Oral QHS Rise Patience, MD   1 mg at 12/15/20 2140  . melatonin tablet 10 mg  10 mg Oral QHS Hal Hope,  Doreatha Lew, MD   10 mg at 12/15/20 2141  . montelukast (SINGULAIR) tablet 10 mg  10 mg Oral QHS Rise Patience, MD   10 mg at 12/15/20 2141  . primidone (MYSOLINE) tablet 50 mg  50 mg Oral QHS Rise Patience, MD   50 mg at 12/15/20 2140  . spironolactone (ALDACTONE) tablet 12.5 mg  12.5 mg Oral Daily Rise Patience, MD   12.5 mg at 12/16/20 8032  . tamsulosin (FLOMAX) capsule 0.4 mg  0.4 mg Oral QPC supper Rise Patience, MD   0.4 mg at 12/15/20 1959     Discharge Medications: Please see discharge summary for a list of discharge medications.  Relevant Imaging  Results:  Relevant Lab Results:   Additional Information SS# [id number] LCSWA

## 2020-12-16 NOTE — Social Work (Signed)
To Whom It May Concern:  Please be advised that the above-named patient will require a short-term nursing home stay - anticipated 30 days or less for rehabilitation and strengthening.  The plan is for return home.  

## 2020-12-17 MED ORDER — FOLIC ACID 1 MG PO TABS
1.0000 mg | ORAL_TABLET | Freq: Every day | ORAL | Status: DC
  Administered 2020-12-18 – 2020-12-20 (×3): 1 mg via ORAL
  Filled 2020-12-17 (×3): qty 1

## 2020-12-17 MED ORDER — CYANOCOBALAMIN 1000 MCG/ML IJ SOLN
1000.0000 ug | Freq: Every day | INTRAMUSCULAR | Status: DC
  Administered 2020-12-18 – 2020-12-20 (×3): 1000 ug via INTRAMUSCULAR
  Filled 2020-12-17 (×3): qty 1

## 2020-12-17 MED ORDER — CYANOCOBALAMIN 1000 MCG/ML IJ SOLN: 1000.0000 ug | Freq: Once | INTRAMUSCULAR | Status: DC

## 2020-12-17 NOTE — Progress Notes (Signed)
[place] [facility] PROGRESS NOTE    XION DEBRUYNE  IOE:[id number] DOB: 06-Jan-1946 DOA: 12/14/2020 PCP: Janine Limbo, PA-C      Brief Narrative:  Mr. Corvino is a 75 y.o. M with hx AVR, HTN, smoking, and lumbar myelopathy who presented with lower extremity weakness and progressive difficulty ambulating over several weeks, with progressive falls.  In the ER, patient was found to have normal MRI brain, no myelopathy on MRI spine, but hyperreflexia, tremors, and bradykinesia.  Neurology were consulted, who felt that his gradually increasing slowness of movements, as well as tremor correlated well with initiation of his Abilify several months ago, most consistent with drug-induced parkinsonism.  Abilify was held.        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  His symptoms were judged to be from Abilify induced parkinsonism, so this was stopped.  - Stop Abilify - PT eval and rehab - Supplement B12 - Consult Psychiatry for alternative to Abilify     Vitamin B12 deficiency B12 level low.  Given neurologic symptoms, will give 1000 mcg once daily for a week - Start B12 injections daily for 1 week - Start folate   Bipolar disorder - Continue clonazepam - Consult Psychiatry here for Abilify alternative and follow-up with outpatient psychiatry  Hypertension Blood pressure controlled - Continue carvedilol, low-dose aspirin, spironolactone  History of aortic valve replacement  BPH -Continue Flomax  COPD Not on chronic oxygen, emphysema, no evidence of flare  Essential tremor - Continue primidone        Disposition: Status is: Inpatient  Remains inpatient appropriate because:Unsafe d/c plan   Dispo: The patient is from: Home              Anticipated d/c is to: SNF              Patient currently is medically stable to d/c.   Difficult to place  patient No       Level of care: Med-Surg       MDM: The below labs and imaging reports were reviewed and summarized above.  Medication management as above.    DVT prophylaxis: SCDs Start: 12/15/20 0151  Code Status: FULL Family Communication: Daughter by phone            Subjective: No headache, chest pain, dyspnea, abdominal pain.  He is still fairly weak.  No loss of consciousness or confusion.  Objective: Vitals:   12/17/20 0335 12/17/20 0336 12/17/20 0700 12/17/20 1100  BP: 117/89 117/89 121/89 118/89  Pulse: 71 69 73 69  Resp: 18  16 18   Temp: 97.6 F (36.4 C) 97.6 F (36.4 C) 98.8 F (37.1 C) 98.5 F (36.9 C)  TempSrc: Oral Oral Oral Oral  SpO2: 97% 96% 93% 94%    Intake/Output Summary (Last 24 hours) at 12/17/2020 1536 Last data filed at 12/17/2020 0445 Gross per 24 hour  Intake --  Output 500 ml  Net -500 ml   There were no vitals filed for this visit.  Examination: General appearance: Elderly adult male, alert and in no acute distress.  Lying in bed, appears weak and debilitated. HEENT: Anicteric, conjunctiva pink, lids and lashes normal. No nasal deformity, discharge, epistaxis.  Lips moist, dentures in place, oropharynx moist, no oral lesions.   Skin: Warm and dry.  No jaundice.  No suspicious rashes or lesions. Cardiac: RRR, nl S1-S2, no murmurs appreciated.  Capillary refill is  brisk.  JVP normal.  No LE edema.  Radial pulses 2+ and symmetric. Respiratory: Normal respiratory rate and rhythm.  CTAB without rales or wheezes. Abdomen: Abdomen soft.  No TTP or guarding. No ascites, distension, hepatosplenomegaly.   MSK: No deformities or effusions. Neuro: Awake and alert.  EOMI, moves all extremities with severe generalized weakness. Speech fluent but slow.    Psych: Sensorium intact and responding to questions, psychomotor slowing noted attention normal. Affect flat.  Judgment and insight appear normal.    Data Reviewed: I have  personally reviewed following labs and imaging studies:  CBC: Recent Labs  Lab 12/14/20 1618 12/15/20 0745  WBC 9.7 8.6  HGB 14.0 14.0  HCT 41.0 40.6  MCV 100.5* 101.2*  PLT 195 081   Basic Metabolic Panel: Recent Labs  Lab 12/14/20 1618 12/15/20 0745  NA 137 135  K 4.2 3.8  CL 103 102  CO2 24 26  GLUCOSE 98 103*  BUN 13 11  CREATININE 0.79 0.74  CALCIUM 8.7* 9.0   GFR: CrCl cannot be calculated (Unknown ideal weight.). Liver Function Tests: Recent Labs  Lab 12/14/20 1618  AST 12*  ALT 9  ALKPHOS 57  BILITOT 0.8  PROT 5.9*  ALBUMIN 3.1*   No results for input(s): LIPASE, AMYLASE in the last 168 hours. No results for input(s): AMMONIA in the last 168 hours. Coagulation Profile: No results for input(s): INR, PROTIME in the last 168 hours. Cardiac Enzymes: No results for input(s): CKTOTAL, CKMB, CKMBINDEX, TROPONINI in the last 168 hours. BNP (last 3 results) No results for input(s): PROBNP in the last 8760 hours. HbA1C: No results for input(s): HGBA1C in the last 72 hours. CBG: No results for input(s): GLUCAP in the last 168 hours. Lipid Profile: No results for input(s): CHOL, HDL, LDLCALC, TRIG, CHOLHDL, LDLDIRECT in the last 72 hours. Thyroid Function Tests: No results for input(s): TSH, T4TOTAL, FREET4, T3FREE, THYROIDAB in the last 72 hours. Anemia Panel: Recent Labs    12/16/20 1216  VITAMINB12 107*   Urine analysis:    Component Value Date/Time   COLORURINE YELLOW 12/14/2020 1701   APPEARANCEUR CLEAR 12/14/2020 1701   LABSPEC 1.013 12/14/2020 1701   PHURINE 6.0 12/14/2020 1701   GLUCOSEU NEGATIVE 12/14/2020 1701   HGBUR NEGATIVE 12/14/2020 1701   BILIRUBINUR NEGATIVE 12/14/2020 1701   KETONESUR NEGATIVE 12/14/2020 1701   PROTEINUR NEGATIVE 12/14/2020 1701   NITRITE NEGATIVE 12/14/2020 1701   LEUKOCYTESUR NEGATIVE 12/14/2020 1701   Sepsis Labs: @LABRCNTIP (procalcitonin:4,lacticacidven:4)  ) Recent Results (from the past 240 hour(s))   Resp Panel by RT-PCR (Flu A&B, Covid) Nasopharyngeal Swab     Status: None   Collection Time: 12/14/20  5:49 PM   Specimen: Nasopharyngeal Swab; Nasopharyngeal(NP) swabs in vial transport medium  Result Value Ref Range Status   SARS Coronavirus 2 by RT PCR NEGATIVE NEGATIVE Final    Comment: (NOTE) SARS-CoV-2 target nucleic acids are NOT DETECTED.  The SARS-CoV-2 RNA is generally detectable in upper respiratory specimens during the acute phase of infection. The lowest concentration of SARS-CoV-2 viral copies this assay can detect is 138 copies/mL. A negative result does not preclude SARS-Cov-2 infection and should not be used as the sole basis for treatment or other patient management decisions. A negative result may occur with  improper specimen collection/handling, submission of specimen other than nasopharyngeal swab, presence of viral mutation(s) within the areas targeted by this assay, and inadequate number of viral copies(<138 copies/mL). A negative result must be combined with clinical observations, patient  history, and epidemiological information. The expected result is Negative.  Fact Sheet for Patients:  EntrepreneurPulse.com.au  Fact Sheet for Healthcare Providers:  IncredibleEmployment.be  This test is no t yet approved or cleared by the [place] FDA and  has been authorized for detection and/or diagnosis of SARS-CoV-2 by FDA under an Emergency Use Authorization (EUA). This EUA will remain  in effect (meaning this test can be used) for the duration of the COVID-19 declaration under Section 564(b)(1) of the Act, 21 U.S.C.section 360bbb-3(b)(1), unless the authorization is terminated  or revoked sooner.       Influenza A by PCR NEGATIVE NEGATIVE Final   Influenza B by PCR NEGATIVE NEGATIVE Final    Comment: (NOTE) The Xpert Xpress SARS-CoV-2/FLU/RSV plus assay is intended as an aid in the diagnosis of influenza from  Nasopharyngeal swab specimens and should not be used as a sole basis for treatment. Nasal washings and aspirates are unacceptable for Xpert Xpress SARS-CoV-2/FLU/RSV testing.  Fact Sheet for Patients: EntrepreneurPulse.com.au  Fact Sheet for Healthcare Providers: IncredibleEmployment.be  This test is not yet approved or cleared by the [place] FDA and has been authorized for detection and/or diagnosis of SARS-CoV-2 by FDA under an Emergency Use Authorization (EUA). This EUA will remain in effect (meaning this test can be used) for the duration of the COVID-19 declaration under Section 564(b)(1) of the Act, 21 U.S.C. section 360bbb-3(b)(1), unless the authorization is terminated or revoked.  Performed at [facility], [place] [address], [place], [place] [address]          Radiology Studies: No results found.      Scheduled Meds: . aspirin  325 mg Oral Daily  . carvedilol  25 mg Oral BID WC  . clonazePAM  1 mg Oral QHS  . melatonin  10 mg Oral QHS  . montelukast  10 mg Oral QHS  . primidone  50 mg Oral QHS  . spironolactone  12.5 mg Oral Daily  . tamsulosin  0.4 mg Oral QPC supper   Continuous Infusions:   LOS: 1 day    Time spent: 35 minutes    Edwin Dada, MD [facility] 12/17/2020, 3:36 PM     Please page though [place] or Epic secure chat:  For [employer], Adult nurse

## 2020-12-17 NOTE — Plan of Care (Signed)

## 2020-12-17 NOTE — Progress Notes (Signed)
Inpatient Rehab Admissions Coordinator Note:   CIR consult received.  At this time, Pt. Is min guard with ambulation and does not appear to have medical necessity to support CIR admission. Current payor source is also very unlikely to approve stay and PT is recommending short term rehab at SNF, which I believe is an appropriate venue. CIR will sign off.   Clemens Catholic, [place], [place] Admissions Coordinator  [phone] ([place]) [phone] (office)

## 2020-12-17 NOTE — TOC Progression Note (Signed)
Transition of Care [facility]) - Progression Note    Patient Details  Name: RONEN BROMWELL MRN: [id number] Date of Birth: 1946/03/18  Transition of Care [facility]) CM/SW Contact  Joanne Chars, LCSW Phone Number: 12/17/2020, 9:37 AM  Clinical Narrative:  Level 2 PASSR docs uploaded to [place] [facility]          Expected Discharge Plan and Services                                                 Social Determinants of Health (SDOH) Interventions    Readmission Risk Interventions Readmission Risk Prevention Plan 05/08/2018  Transportation Screening Complete  PCP or Specialist Appt within 5-7 Days Complete  Home Care Screening Complete  Medication Review (RN CM) Complete  Some recent data might be hidden

## 2020-12-18 ENCOUNTER — Ambulatory Visit: Payer: Medicare Other | Admitting: Cardiology

## 2020-12-18 DIAGNOSIS — G2119 Other drug induced secondary parkinsonism: Secondary | ICD-10-CM | POA: Diagnosis not present

## 2020-12-18 DIAGNOSIS — I10 Essential (primary) hypertension: Secondary | ICD-10-CM

## 2020-12-18 HISTORY — DX: Other drug induced secondary parkinsonism: G21.19

## 2020-12-18 NOTE — Plan of Care (Signed)

## 2020-12-18 NOTE — Progress Notes (Signed)
PROGRESS NOTE    Travis Reese  ZDG:[id number] DOB: 1945-11-09 DOA: 12/14/2020 PCP: Janine Limbo, PA-C    Brief Narrative:  Travis Reese has been admitted to the hospital with the working diagnosis of drug induced parkinsonism.   75 year old male with past medical history of aortic stenosis s/p bioprosthetic valve placement, hypertension, BPH and lumbar myelopathy who presented with worsening lower extremity weakness and difficulty ambulating for 5 weeks.  Frequent falls at home, he has been using a walker, denies any bowel or urinary incontinence.  On his initial physical examination blood pressure 131/84, heart rate 81, respiratory rate 17, oxygen saturation 93%, his lungs were clear to auscultation bilaterally, heart S1-S2, present rhythmic, soft abdomen, no lower extremity edema.  Decreased strength upper extremities 4/5, lower extremities 2/5.  Positive hyperreflexia.  Further work-up with brain MRI with several small areas of diffusion hyperintensity in the left parietal white matter and cortex.   Cervical spine MRI with worsening severe left C3-4 neural foraminal stenosis.  Unchanged severe left C4-5 and right C5-6 neural foraminal stenosis.  No spinal canal stenosis.   Toracic and lumbar spine MRI with moderate left and mild right foraminal stenosis T9-T10 and T10-T11.  Mild foraminal stenosis T11-T12.  Mild canal stenosis T9-T10.  Small central disc protrusion at T4-T5 and T5-T6.  Severe foraminal stenosis on the right L2-L3 and L4-L5.  Multilevel moderate foraminal stenosis.  Moderate to severe canal stenosis and severe right subarticular recess stenosis L4-L5.  Additional multilevel right subarticular recess narrowing.  Neurologic symptoms, parkinsonism, likely related to Abilify. This medication has been discontinued. PT and OT have recommended CIR but patient not candidate for this services.  Possible transfer to SNF.   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 Aripripazole  (abilify) induced parkinsonism. Patient continue to be very weak and deconditioned. Poor mobility.   Plan to continue to hold aripirazole and follow with physical therapy/ occupational therapy for further rehab. Recommendation to avoid neuroleptic agents.  Patient can not go home due to unsafe discharge.   2. Vitamin B12 deficiency. Continue with B12 supplementation.   3. HTN. Continue blood pressure control with carvedilol and spironolactone.  On aspirin full dose.   4. COPD. No clinical sings of exacerbation, continue oxymetry monitoring with vital signs.   5. Depression/ bipolar disorder. Continue with clonazepam. Psych has been consulted for alternative agent in exchange of aripiriazole.      Status is: Inpatient  Remains inpatient appropriate because:Unsafe d/c plan and Inpatient level of care appropriate due to severity of illness   Dispo: The patient is from: Home              Anticipated d/c is to: SNF              Patient currently is medically stable to d/c.   Difficult to place patient No   DVT prophylaxis: Enoxaparin   Code Status:   full  Family Communication:  No family at the bedside      Consultants:   Neurology   Psychiatry    Subjective: Patient continue to have significant weakness and difficulty ambulating, no nausea or vomiting, no chest pain or dyspnea.   Objective: Vitals:   12/17/20 1931 12/17/20 2346 12/18/20 0340 12/18/20 0755  BP: 118/70  105/76 (!) 135/91 120/87  Pulse: 75 77 69 74  Resp:  18 18 17   Temp: 98.1 F (36.7 C) 97.7 F (36.5 C) (!) 97.4 F (36.3 C) 97.7 F (36.5 C)  TempSrc: Oral Oral Oral Oral  SpO2: 96% 95% 95% 95%    Intake/Output Summary (Last 24 hours) at 12/18/2020 1138 Last data filed at 12/18/2020 1013 Gross  per 24 hour  Intake --  Output 825 ml  Net -825 ml   There were no vitals filed for this visit.  Examination:   General: Not in pain or dyspnea.  Neurology: Awake and alert, slow to response to questions.  E ENT: mild pallor, no icterus, oral mucosa moist Cardiovascular: No JVD. S1-S2 present, rhythmic, no gallops, rubs, or murmurs. No lower extremity edema. Pulmonary: positive breath sounds bilaterally, adequate air movement, no wheezing, rhonchi or rales. Gastrointestinal. Abdomen soft and non tender Skin. No rashes Musculoskeletal: no joint deformities     Data Reviewed: I have personally reviewed following labs and imaging studies  CBC: Recent Labs  Lab 12/14/20 1618 12/15/20 0745  WBC 9.7 8.6  HGB 14.0 14.0  HCT 41.0 40.6  MCV 100.5* 101.2*  PLT 195 270   Basic Metabolic Panel: Recent Labs  Lab 12/14/20 1618 12/15/20 0745  NA 137 135  K 4.2 3.8  CL 103 102  CO2 24 26  GLUCOSE 98 103*  BUN 13 11  CREATININE 0.79 0.74  CALCIUM 8.7* 9.0   GFR: CrCl cannot be calculated (Unknown ideal weight.). Liver Function Tests: Recent Labs  Lab 12/14/20 1618  AST 12*  ALT 9  ALKPHOS 57  BILITOT 0.8  PROT 5.9*  ALBUMIN 3.1*   No results for input(s): LIPASE, AMYLASE in the last 168 hours. No results for input(s): AMMONIA in the last 168 hours. Coagulation Profile: No results for input(s): INR, PROTIME in the last 168 hours. Cardiac Enzymes: No results for input(s): CKTOTAL, CKMB, CKMBINDEX, TROPONINI in the last 168 hours. BNP (last 3 results) No results for input(s): PROBNP in the last 8760 hours. HbA1C: No results for input(s): HGBA1C in the last 72 hours. CBG: No results for input(s): GLUCAP in the last 168 hours. Lipid Profile: No results for input(s): CHOL, HDL, LDLCALC, TRIG, CHOLHDL, LDLDIRECT in the last 72 hours. Thyroid Function Tests: No results for input(s): TSH, T4TOTAL, FREET4, T3FREE, THYROIDAB in the last 72 hours. Anemia Panel: Recent  Labs    12/16/20 1216  VITAMINB12 107*      Radiology Studies: I have reviewed all of the imaging during this hospital visit personally     Scheduled Meds: . aspirin  325 mg Oral Daily  . carvedilol  25 mg Oral BID WC  . clonazePAM  1 mg Oral QHS  . cyanocobalamin  1,000 mcg Intramuscular Daily  . folic acid  1 mg Oral Daily  . melatonin  10 mg Oral QHS  . montelukast  10 mg Oral QHS  . primidone  50 mg Oral QHS  . spironolactone  12.5 mg Oral Daily  . tamsulosin  0.4 mg Oral QPC supper   Continuous Infusions:   LOS: 2 days        Chinaza Rooke Gerome Apley, MD

## 2020-12-18 NOTE — Plan of Care (Signed)

## 2020-12-18 NOTE — TOC Initial Note (Signed)
Transition of Care [facility]) - Initial/Assessment Note    Patient Details  Name: Travis Reese MRN: [id number] Date of Birth: 07-13-1946  Transition of Care [facility]) CM/SW Contact:    Geralynn Ochs, LCSW Phone Number: 12/18/2020, 3:19 PM  Clinical Narrative:    CSW spoke with patient's daughter, Travis Reese, to discuss SNF recommendation. Christie in agreement, request for either [facility] facility or [facility]. CSW to fax out referral and follow up with SNF offers.                Expected Discharge Plan: Skilled Nursing Facility Barriers to Discharge: Insurance Authorization,Continued Medical Work up   Patient Goals and CMS Choice Patient states their goals for this hospitalization and ongoing recovery are:: get back home CMS Medicare.gov Compare Post Acute Care list provided to:: Patient Represenative (must comment) Choice offered to / list presented to : Adult Children  Expected Discharge Plan and Services Expected Discharge Plan: [place] Acute Care Choice: [place] Living arrangements for the past 2 months: Single Family Home                                      Prior Living Arrangements/Services Living arrangements for the past 2 months: Single Family Home Lives with:: Significant Other Patient language and need for interpreter reviewed:: No Do you feel safe going back to the place where you live?: Yes      Need for Family Participation in Patient Care: Yes (Comment) Care giver support system in place?: No (comment)   Criminal Activity/Legal Involvement Pertinent to Current Situation/Hospitalization: No - Comment as needed  Activities of Daily Living Home Assistive Devices/Equipment: None ADL Screening (condition at time of admission) Patient's cognitive ability adequate to safely complete daily activities?: Yes Is the patient deaf or have difficulty hearing?: Yes Does the patient have difficulty seeing, even  when wearing glasses/contacts?: No Does the patient have difficulty concentrating, remembering, or making decisions?: No Patient able to express need for assistance with ADLs?: Yes Does the patient have difficulty dressing or bathing?: No Independently performs ADLs?: Yes (appropriate for developmental age) Does the patient have difficulty walking or climbing stairs?: Yes Weakness of Legs: Both Weakness of Arms/Hands: Both  Permission Sought/Granted Permission sought to share information with : Facility Retail banker granted to share information with : Yes, Verbal Permission Granted  Share Information with NAME: Travis Reese  Permission granted to share info w AGENCY: SNF  Permission granted to share info w Relationship: Daughter     Emotional Assessment Appearance:: Appears stated age Attitude/Demeanor/Rapport: Engaged Affect (typically observed): Appropriate Orientation: : Oriented to Self,Oriented to Place,Oriented to  Time,Oriented to Situation Alcohol / Substance Use: Not Applicable Psych Involvement: No (comment)  Admission diagnosis:  Weakness [R53.1] History of progressive weakness [Z87.898] Progressive neurological disorder [R29.818] Patient Active Problem List   Diagnosis Date Noted  . 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 Tremor   . Weakness 12/15/2020  . S/P aortic valve replacement with bioprosthetic valve 05/01/2018  . HTN (hypertension) 04/19/2018  . Acute CHF (congestive heart failure) ([place]) 04/19/2018  . BPH (benign prostatic hyperplasia) 04/19/2018  . Anxiety 04/19/2018  . Cellulitis of face 10/15/2016  . Mixed hyperlipidemia 09/23/2016  . Other fatigue 07/23/2016  . Recurrent sinusitis 07/23/2016  . Other emphysema ([place]) 07/11/2016  . Restless leg syndrome 07/10/2016  . Plantar wart of  left foot 01/05/2016  . Prolapsed internal hemorrhoids, grade 3 04/19/2015   PCP:  Janine Limbo, PA-C Pharmacy:   [facility], [address], [address] [place], [place] [address] Phone: [phone] Fax: [phone]  [facility], [address] [place] [address] Phone: [phone] Fax: [phone]  [facility], [address] [place] [address] Phone: [phone] Fax: [phone]     Social Determinants of Health (SDOH) Interventions    Readmission Risk Interventions Readmission Risk Prevention Plan 05/08/2018  Transportation Screening Complete  PCP or Specialist Appt within 5-7 Days Complete  Home Care Screening Complete  Medication Review (RN CM) Complete  Some recent data might be hidden

## 2020-12-18 NOTE — NC FL2 (Signed)
[place] LEVEL OF CARE SCREENING TOOL     IDENTIFICATION  Patient Name: Travis Reese Birthdate: 10/01/1945 Sex: male Admission Date (Current Location): 12/14/2020  [facility] and [place] Number:      Facility and Address:  [facility] [place], [place] [address] [place], [place] [address]      Provider Number: [id number]  Attending Physician Name and Address:  Tawni Millers,*  Relative Name and Phone Number:  Ammie Ferrier, daughter, [phone]    Current Level of Care: Hospital Recommended Level of Care: [place] Prior Approval Number:    Date Approved/Denied:   PASRR Number:    Discharge Plan: SNF    Current Diagnoses: Patient Active Problem List   Diagnosis Date Noted  . 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 Tremor   . Weakness 12/15/2020  . S/P aortic valve replacement with bioprosthetic valve 05/01/2018  . HTN (hypertension) 04/19/2018  . Acute CHF (congestive heart failure) ([place]) 04/19/2018  . BPH (benign prostatic hyperplasia) 04/19/2018  . Anxiety 04/19/2018  . Cellulitis of face 10/15/2016  . Mixed hyperlipidemia 09/23/2016  . Other fatigue 07/23/2016  . Recurrent sinusitis 07/23/2016  . Other emphysema ([place]) 07/11/2016  . Restless leg syndrome 07/10/2016  . Plantar wart of left foot 01/05/2016  . Prolapsed internal hemorrhoids, grade 3 04/19/2015    Orientation RESPIRATION BLADDER Height & Weight     Self,Time,Situation,Place  Normal Continent Weight:   Height:     BEHAVIORAL SYMPTOMS/MOOD NEUROLOGICAL BOWEL NUTRITION STATUS      Continent Diet (See DC summary)  AMBULATORY STATUS COMMUNICATION OF NEEDS Skin   Limited Assist Verbally Skin abrasions (Abrasion on R elbow w/ foam dressing)                       Personal Care Assistance Level of Assistance  Bathing,Feeding,Dressing Bathing Assistance: Limited assistance Feeding assistance: Independent Dressing Assistance:  Limited assistance     Functional Limitations Info  Sight,Hearing,Speech Sight Info: Adequate Hearing Info: Adequate Speech Info: Adequate    SPECIAL CARE FACTORS FREQUENCY  PT (By licensed PT),OT (By licensed OT)     PT Frequency: 5x week OT Frequency: 5x week            Contractures Contractures Info: Not present    Additional Factors Info  Code Status,Allergies,Psychotropic Code Status Info: Full Allergies Info: NKA Psychotropic Info: Clonzepam (Klonopin)         Current Medications (12/18/2020):  This is the current hospital active medication list Current Facility-Administered Medications  Medication Dose Route Frequency Provider Last Rate Last Admin  . acetaminophen (TYLENOL) tablet 650 mg  650 mg Oral Q6H PRN Rise Patience, MD   650 mg at 12/17/20 2023   Or  . acetaminophen (TYLENOL) suppository 650 mg  650 mg Rectal Q6H PRN Rise Patience, MD      . albuterol (VENTOLIN HFA) 108 (90 Base) MCG/ACT inhaler 2 puff  2 puff Inhalation Q6H PRN Rise Patience, MD      . aspirin EC tablet 325 mg  325 mg Oral Daily Rise Patience, MD   325 mg at 12/18/20 1019  . carvedilol (COREG) tablet 25 mg  25 mg Oral BID WC Rise Patience, MD   25 mg at 12/18/20 1019  . clonazePAM (KLONOPIN) tablet 1 mg  1 mg Oral QHS Rise Patience, MD   1 mg at 12/17/20 2205  . cyanocobalamin ((VITAMIN  B-12)) injection 1,000 mcg  1,000 mcg Intramuscular Daily Edwin Dada, MD   1,000 mcg at 12/18/20 1020  . folic acid (FOLVITE) tablet 1 mg  1 mg Oral Daily Danford, Suann Larry, MD   1 mg at 12/18/20 1019  . melatonin tablet 10 mg  10 mg Oral QHS Rise Patience, MD   10 mg at 12/17/20 2205  . montelukast (SINGULAIR) tablet 10 mg  10 mg Oral QHS Rise Patience, MD   10 mg at 12/17/20 2205  . primidone (MYSOLINE) tablet 50 mg  50 mg Oral QHS Rise Patience, MD   50 mg at 12/17/20 2205  . spironolactone (ALDACTONE) tablet 12.5 mg  12.5 mg  Oral Daily Rise Patience, MD   12.5 mg at 12/18/20 1019  . tamsulosin (FLOMAX) capsule 0.4 mg  0.4 mg Oral QPC supper Rise Patience, MD   0.4 mg at 12/17/20 1730     Discharge Medications: Please see discharge summary for a list of discharge medications.  Relevant Imaging Results:  Relevant Lab Results:   Additional Information SS# [place], [place]

## 2020-12-18 NOTE — Progress Notes (Signed)
Patient stable for discharge per MD order. IV removed. Discharge paperwork reviewed with patient and family, all questions answered. Belongings: clothing, cell phone, Games developer. Patient leaving for home.

## 2020-12-18 NOTE — Consult Note (Addendum)
Diagnosis: Major Depression Disorder, Moderate Reason for consult: medication recommendations for Bipolar. 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   HPI:75 year old male with past medical history of aortic stenosis s/p bioprosthetic valve placement, hypertension, BPH and lumbar myelopathy who presented with worsening lower extremity weakness and difficulty ambulating for 5 weeks.  Frequent falls at home, he has been using a walker, denies any bowel or urinary incontinence.  On his initial physical examination blood pressure 131/84, heart rate 81, respiratory rate 17, oxygen saturation 93%, his lungs were clear to auscultation bilaterally, heart S1-S2, present rhythmic, soft abdomen, no lower extremity edema.  Decreased strength upper extremities 4/5, lower extremities 2/5.  Positive hyperreflexia. Neurologic symptoms, parkinsonism, likely related to Abilify. This medication has been discontinued. PT and OT have recommended CIR but patient not candidate for this services.   Patient reports long standing history of depression with multiple treatment failure options. " I have tried just about everything, and nothing worked for my depression. The Abilify wasn't doing nothing either so we might as well stop it. I was having difficulty walking and muscle weakness. " Patient states his neurologist "Dr. Carles Collet" originally prescribed the Abilify. " I requested to be put on the Abilify. I was having trouble with depression. " At this time he endorses depressive symptoms of worthlessness, decreased motivation, no interest or pleasure in things. He currently rates his depression 5/10 with 10 being the worse on a Likert scale. He denies any suicide attempts, suicidal thoughts or suicidal behaviors. He denies any previous history of suicidal intentions or suicidal attempts. HE is able to contract for safety while in the hospital. He denies any hallucinations at this time. He appears to be open to seeking therapy  services outpatient by psychiatrist to include ECT therapy.   -He does not meet criteria for inpatient geropsych admission at this time.  -Abilify has been discontinued at this time. Will need to be monitor closely for rebound depression, suicidal ideation and psychosis. Unfortunately as discussed with patient most antipsychotics are at risk for causing EPS and or drug induced parkinsonism.  -Consider starting Cymbalta 20mg  po BID for depression and management of neuropathic pain. Can titrate this dose appropriately to target symptoms above.  -Psychiatry to sign off at this time.

## 2020-12-19 LAB — SARS CORONAVIRUS 2 (TAT 6-24 HRS): SARS Coronavirus 2: NEGATIVE

## 2020-12-19 NOTE — Care Management Important Message (Signed)
Important Message  Patient Details  Name: Travis Reese MRN: [id number] Date of Birth: [birth year]   Medicare Important Message Given:  Yes     Maegan Buller Montine Circle 12/19/2020, 4:26 PM

## 2020-12-19 NOTE — Progress Notes (Signed)
PROGRESS NOTE    NAKUL AVINO  WJX:[id number] DOB: October 01, 1945 DOA: 12/14/2020 PCP: Janine Limbo, PA-C    Brief Narrative:  Mr. Southwell has been admitted to the hospital with the working diagnosis of drug induced parkinsonism.   75 year old male with past medical history of aortic stenosis s/p bioprosthetic valve placement, hypertension, BPH and lumbar myelopathy who presented with worsening lower extremity weakness and difficulty ambulating for 5 weeks.  Frequent falls at home, he has been using a walker, denies any bowel or urinary incontinence.  On his initial physical examination blood pressure 131/84, heart rate 81, respiratory rate 17, oxygen saturation 93%, his lungs were clear to auscultation bilaterally, heart S1-S2, present rhythmic, soft abdomen, no lower extremity edema.  Decreased strength upper extremities 4/5, lower extremities 2/5.  Positive hyperreflexia.  Further work-up with brain MRI with several small areas of diffusion hyperintensity in the left parietal white matter and cortex.   Cervical spine MRI with worsening severe left C3-4 neural foraminal stenosis.  Unchanged severe left C4-5 and right C5-6 neural foraminal stenosis.  No spinal canal stenosis.   Toracic and lumbar spine MRI with moderate left and mild right foraminal stenosis T9-T10 and T10-T11.  Mild foraminal stenosis T11-T12.  Mild canal stenosis T9-T10.  Small central disc protrusion at T4-T5 and T5-T6.  Severe foraminal stenosis on the right L2-L3 and L4-L5.  Multilevel moderate foraminal stenosis.  Moderate to severe canal stenosis and severe right subarticular recess stenosis L4-L5.  Additional multilevel right subarticular recess narrowing.  Neurologic symptoms, parkinsonism, likely related to Abilify. This medication has been discontinued. PT and OT have recommended CIR but patient not candidate for this services.  Possible transfer to SNF.    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 Aripripazole  (abilify) induced parkinsonism.  Mobility slowly improving but not yet back to baseline,   Continue to avoid neuroleptic agents.   Pending transfer patient to SNF to continue with aggressive PT and OT.   2. Vitamin B12 deficiency. On B12 supplementation.   3. HTN. On carvedilol and spironolactone for blood pressure control.  Continue with aspirin.    4. COPD. No exacerbation, on oxymetry monitoring.   5. Depression/ bipolar disorder. Tolerating well clonazepam. Aripirazole has been discontinued Monitor for signs of rebuond depression, suicidal ideation and psychosis. Currently not candidate for inpatient psychiatry.  Continue with primidone and clonazepam.   Add Cymbalta 20 mg bid per psych recommendations.   Status is: Inpatient  Remains inpatient appropriate because:Unsafe d/c plan   Dispo: The patient is from: Home              Anticipated d/c is to: SNF              Patient currently is medically stable to d/c.   Difficult to place patient No   DVT prophylaxis: Enoxaparin   Code Status:   full  Family Communication:   I was not able to reach his daughter over the phone, left a message.     Consultants:   Neurology   Subjective: Patient continue to be very weak and deconditioned, no nausea or vomiting, no chest pain or dyspnea.   Objective: Vitals:   12/18/20 1952 12/18/20 2337 12/19/20 0354 12/19/20 0837  BP: (!) 142/98 107/80 135/89 Marland Kitchen)  135/92  Pulse: 76 68 73 76  Resp: 19 17 17 19   Temp: 97.7 F (36.5 C) 98.1 F (36.7 C) 97.7 F (36.5 C) 98.3 F (36.8 C)  TempSrc: Oral Oral Oral Oral  SpO2: 94% 93% 93% 96%    Intake/Output Summary (Last 24 hours) at 12/19/2020 1222 Last data filed at 12/19/2020 7017 Gross per 24 hour  Intake --   Output 1000 ml  Net -1000 ml   There were no vitals filed for this visit.  Examination:   General: Not in pain or dyspnea., deconditioned  Neurology: Awake and alert, decreased rigidity upper and lower extremities, but not yet back to baseline.  E ENT: no pallor, no icterus, oral mucosa moist Cardiovascular: No JVD. S1-S2 present, rhythmic, no gallops, rubs, or murmurs. No lower extremity edema. Pulmonary: vesicular breath sounds bilaterally,  Gastrointestinal. Abdomen soft and non tender Skin. No rashes Musculoskeletal: no joint deformities     Data Reviewed: I have personally reviewed following labs and imaging studies  CBC: Recent Labs  Lab 12/14/20 1618 12/15/20 0745  WBC 9.7 8.6  HGB 14.0 14.0  HCT 41.0 40.6  MCV 100.5* 101.2*  PLT 195 793   Basic Metabolic Panel: Recent Labs  Lab 12/14/20 1618 12/15/20 0745  NA 137 135  K 4.2 3.8  CL 103 102  CO2 24 26  GLUCOSE 98 103*  BUN 13 11  CREATININE 0.79 0.74  CALCIUM 8.7* 9.0   GFR: CrCl cannot be calculated (Unknown ideal weight.). Liver Function Tests: Recent Labs  Lab 12/14/20 1618  AST 12*  ALT 9  ALKPHOS 57  BILITOT 0.8  PROT 5.9*  ALBUMIN 3.1*   No results for input(s): LIPASE, AMYLASE in the last 168 hours. No results for input(s): AMMONIA in the last 168 hours. Coagulation Profile: No results for input(s): INR, PROTIME in the last 168 hours. Cardiac Enzymes: No results for input(s): CKTOTAL, CKMB, CKMBINDEX, TROPONINI in the last 168 hours. BNP (last 3 results) No results for input(s): PROBNP in the last 8760 hours. HbA1C: No results for input(s): HGBA1C in the last 72 hours. CBG: No results for input(s): GLUCAP in the last 168 hours. Lipid Profile: No results for input(s): CHOL, HDL, LDLCALC, TRIG, CHOLHDL, LDLDIRECT in the last 72 hours. Thyroid Function Tests: No results for input(s): TSH, T4TOTAL, FREET4, T3FREE, THYROIDAB in the last 72 hours. Anemia Panel: No results for  input(s): VITAMINB12, FOLATE, FERRITIN, TIBC, IRON, RETICCTPCT in the last 72 hours.    Radiology Studies: I have reviewed all of the imaging during this hospital visit personally     Scheduled Meds: . aspirin  325 mg Oral Daily  . carvedilol  25 mg Oral BID WC  . clonazePAM  1 mg Oral QHS  . cyanocobalamin  1,000 mcg Intramuscular Daily  . folic acid  1 mg Oral Daily  . melatonin  10 mg Oral QHS  . montelukast  10 mg Oral QHS  . primidone  50 mg Oral QHS  . spironolactone  12.5 mg Oral Daily  . tamsulosin  0.4 mg Oral QPC supper   Continuous Infusions:   LOS: 3 days        Ikey Omary Gerome Apley, MD

## 2020-12-19 NOTE — TOC Progression Note (Signed)
Transition of Care [facility]) - Progression Note    Patient Details  Name: Travis Reese MRN: [id number] Date of Birth: 1946-05-24  Transition of Care [facility]) CM/SW Contact  Marney Setting, [place] Work Phone Number: 12/19/2020, 1:51 PM  Clinical Narrative:   MSW Student spoke with patient and his daughter about [facility] accepting him and they are both happy about the placement. Student informed them both that patient will be likely be discharging tomorrow.     Expected Discharge Plan: [place] Barriers to Discharge: Insurance Authorization,Continued Medical Work up  Expected Discharge Plan and Services Expected Discharge Plan: [place] Choice: [place] arrangements for the past 2 months: Single Family Home                                       Social Determinants of Health (SDOH) Interventions    Readmission Risk Interventions Readmission Risk Prevention Plan 05/08/2018  Transportation Screening Complete  PCP or Specialist Appt within 5-7 Days Complete  Home Care Screening Complete  Medication Review (RN CM) Complete  Some recent data might be hidden

## 2020-12-19 NOTE — Plan of Care (Signed)

## 2020-12-19 NOTE — Progress Notes (Signed)
Physical Therapy Treatment Patient Details Name: Travis Reese MRN: [id number] DOB: Aug 04, 1946 Today's Date: 12/19/2020    History of Present Illness 75 y.o. male presents to [facility] ED on 12/14/2020 with reports of worsening LE weakness and difficulty ambulating. Pt has been followed for lumbar myelopathy and had a fall in shower 5 weeks ago with worsening of symptoms, multiple falls since. 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 PMH includes Anxiety, Aortic valve sclerosis, Arthritis, CHF), Cubital tunnel syndrome, DDD (degenerative disc disease), Depression, HLD, HTN, Mild dilation of ascending aorta ([place]), Scoliosis, Tremor.    PT Comments    Pt very motivated to mobilize today, increased frequency to 3x/wk due to pt's desire and showing improvement. Pt required min A to come to EOB with increased time given for all mobility. With ambulation, pt initially given a visual cue for increasing step length and then able to progress to only verbal cue to maintain step through gait pattern. Pt ambulated 20' with RW and min A. Pt very tight through chest and anterior shoulder, worked on trunk and shoulder stretch in standing against wall x30 secs. PT will continue to follow.    Follow Up Recommendations  SNF;Supervision/Assistance - 24 hour     Equipment Recommendations  Wheelchair (measurements PT);Hospital bed    Recommendations for Other Services       Precautions / Restrictions Precautions Precautions: Fall Precaution Comments: parkinsons-like presentation Restrictions Weight Bearing Restrictions: No    Mobility  Bed Mobility Overal bed mobility: Needs Assistance Bed Mobility: Supine to Sit     Supine to sit: Min assist     General bed mobility comments: min A to BLE's and HHA for trunk elevation into sitting    Transfers Overall transfer level: Needs assistance Equipment used: Rolling walker (2  wheeled) Transfers: Sit to/from Stand Sit to Stand: Min assist         General transfer comment: allowed pt to attempt standing on his own first and pt unsuccessful due to being too far back in bed and not enough fwd wt shift. Cues for scoot out, fwd lean, and hand placement and then pt able to stand with min A for power up. All motion very slow  Ambulation/Gait Ambulation/Gait assistance: Min assist Gait Distance (Feet): 20 Feet Assistive device: Rolling walker (2 wheeled) Gait Pattern/deviations: Trunk flexed;Step-through pattern Gait velocity: reduced Gait velocity interpretation: <1.31 ft/sec, indicative of household ambulator General Gait Details: pt given visual cues to facilitate step through pattern for 1st 5' and was then able to continue with only verbal cues.   Stairs             Wheelchair Mobility    Modified Rankin (Stroke Patients Only)       Balance Overall balance assessment: Needs assistance Sitting-balance support: Single extremity supported;Feet supported Sitting balance-Leahy Scale: Fair Sitting balance - Comments: pt able to maintain sitting EOB without support and lift each foot for donning for shoe   Standing balance support: Single extremity supported;Bilateral upper extremity supported Standing balance-Leahy Scale: Poor Standing balance comment: reliant on UE support of RW                            Cognition Arousal/Alertness: Awake/alert Behavior During Therapy: Flat affect Overall Cognitive Status: Impaired/Different from baseline Area of Impairment: Following commands;Memory  Following Commands: Follows multi-step commands with increased time       General Comments: needs increased processing time but appropriate. repeated same question about male purewick several times during session      Exercises      General Comments General comments (skin integrity, edema, etc.): performed  standing trunk extension and shoulder stretch with hands on wall, 30 sec      Pertinent Vitals/Pain Pain Assessment: No/denies pain    Home Living                      Prior Function            PT Goals (current goals can now be found in the care plan section) Acute Rehab PT Goals Patient Stated Goal: to improve mobility quality and restore independence PT Goal Formulation: With patient Time For Goal Achievement: 12/29/20 Potential to Achieve Goals: Good Progress towards PT goals: Progressing toward goals    Frequency    Min 3X/week      PT Plan Frequency needs to be updated    Co-evaluation              AM-PAC PT "6 Clicks" Mobility   Outcome Measure  Help needed turning from your back to your side while in a flat bed without using bedrails?: A Little Help needed moving from lying on your back to sitting on the side of a flat bed without using bedrails?: A Little Help needed moving to and from a bed to a chair (including a wheelchair)?: A Little Help needed standing up from a chair using your arms (e.g., wheelchair or bedside chair)?: A Little Help needed to walk in hospital room?: A Little Help needed climbing 3-5 steps with a railing? : A Lot 6 Click Score: 17    End of Session Equipment Utilized During Treatment: Gait belt Activity Tolerance: Patient tolerated treatment well Patient left: with call bell/phone within reach;in chair;with chair alarm set Nurse Communication: Mobility status PT Visit Diagnosis: Unsteadiness on feet (R26.81);Other abnormalities of gait and mobility (R26.89);Muscle weakness (generalized) (M62.81);Other symptoms and signs involving the nervous system (R29.898)     Time: 5537-4827 PT Time Calculation (min) (ACUTE ONLY): 23 min  Charges:  $Gait Training: 23-37 mins                     Leighton Roach, [place]  Pager [phone] Office [place] 12/19/2020, 2:06  PM

## 2020-12-20 DIAGNOSIS — R1311 Dysphagia, oral phase: Secondary | ICD-10-CM | POA: Diagnosis not present

## 2020-12-20 DIAGNOSIS — R5381 Other malaise: Secondary | ICD-10-CM | POA: Diagnosis not present

## 2020-12-20 DIAGNOSIS — I5030 Unspecified diastolic (congestive) heart failure: Secondary | ICD-10-CM | POA: Diagnosis not present

## 2020-12-20 DIAGNOSIS — I509 Heart failure, unspecified: Secondary | ICD-10-CM | POA: Diagnosis not present

## 2020-12-20 DIAGNOSIS — I110 Hypertensive heart disease with heart failure: Secondary | ICD-10-CM | POA: Diagnosis not present

## 2020-12-20 DIAGNOSIS — E538 Deficiency of other specified B group vitamins: Secondary | ICD-10-CM | POA: Diagnosis not present

## 2020-12-20 DIAGNOSIS — Z95828 Presence of other vascular implants and grafts: Secondary | ICD-10-CM | POA: Diagnosis not present

## 2020-12-20 DIAGNOSIS — M6259 Muscle wasting and atrophy, not elsewhere classified, multiple sites: Secondary | ICD-10-CM | POA: Diagnosis not present

## 2020-12-20 DIAGNOSIS — M1990 Unspecified osteoarthritis, unspecified site: Secondary | ICD-10-CM | POA: Diagnosis not present

## 2020-12-20 DIAGNOSIS — G20 Parkinson's disease: Secondary | ICD-10-CM | POA: Diagnosis not present

## 2020-12-20 DIAGNOSIS — J449 Chronic obstructive pulmonary disease, unspecified: Secondary | ICD-10-CM | POA: Diagnosis not present

## 2020-12-20 DIAGNOSIS — Z743 Need for continuous supervision: Secondary | ICD-10-CM | POA: Diagnosis not present

## 2020-12-20 DIAGNOSIS — M2550 Pain in unspecified joint: Secondary | ICD-10-CM | POA: Diagnosis not present

## 2020-12-20 DIAGNOSIS — G2119 Other drug induced secondary parkinsonism: Secondary | ICD-10-CM | POA: Diagnosis not present

## 2020-12-20 DIAGNOSIS — I10 Essential (primary) hypertension: Secondary | ICD-10-CM | POA: Diagnosis not present

## 2020-12-20 DIAGNOSIS — Z9181 History of falling: Secondary | ICD-10-CM | POA: Diagnosis not present

## 2020-12-20 DIAGNOSIS — M4805 Spinal stenosis, thoracolumbar region: Secondary | ICD-10-CM | POA: Diagnosis not present

## 2020-12-20 DIAGNOSIS — R531 Weakness: Secondary | ICD-10-CM | POA: Diagnosis not present

## 2020-12-20 DIAGNOSIS — Z7401 Bed confinement status: Secondary | ICD-10-CM | POA: Diagnosis not present

## 2020-12-20 DIAGNOSIS — L89610 Pressure ulcer of right heel, unstageable: Secondary | ICD-10-CM | POA: Diagnosis not present

## 2020-12-20 DIAGNOSIS — M6281 Muscle weakness (generalized): Secondary | ICD-10-CM | POA: Diagnosis not present

## 2020-12-20 DIAGNOSIS — R238 Other skin changes: Secondary | ICD-10-CM | POA: Diagnosis not present

## 2020-12-20 MED ORDER — CYANOCOBALAMIN 1000 MCG/ML IJ SOLN: 1000.0000 ug | Freq: Every day | INTRAMUSCULAR | 0 refills | Status: AC

## 2020-12-20 MED ORDER — CLONAZEPAM 1 MG PO TABS: 1.0000 mg | ORAL_TABLET | Freq: Every day | ORAL | 0 refills | Status: DC

## 2020-12-20 MED ORDER — FOLIC ACID 1 MG PO TABS: 1.0000 mg | ORAL_TABLET | Freq: Every day | ORAL | 0 refills | Status: DC

## 2020-12-20 NOTE — Plan of Care (Signed)

## 2020-12-20 NOTE — Progress Notes (Signed)
Patient picked up by [facility] at 2030 pm, no s/s distress, A&O x4.

## 2020-12-20 NOTE — Discharge Summary (Addendum)
Physician Discharge Summary  Travis Reese CHY:[id number] DOB: 1946/07/20 DOA: 12/14/2020  PCP: Janine Limbo, PA-C  Admit date: 12/14/2020 Discharge date: 12/20/2020  Admitted From: Home  Disposition:  SNF   Recommendations for Outpatient Follow-up and new medication changes:  1. Follow up with [name] PA-C in 7 to 10 days.  2. Aripiprazole (Abilify) has been discontinued due to parkinsonism side effects. Monitor for signs of rebound depression, suicidal ideation and psychosis.  3. Avoid neuroleptics to prevent parkinsonism.  4. Started on Cymbalta for depression.   I spoke over the phone with the patient's daughter about patient's  condition, plan of care, prognosis and all questions were addressed.  Home Health: na   Equipment/Devices: na    Discharge Condition: stable  CODE STATUS: full  Diet recommendation:  Regular   Brief/Interim Summary: Travis Reese was admitted to the hospital with the working diagnosis of drug induced parkinsonism.  75 year old male with past medical history of aortic stenosis s/p bioprosthetic valve placement, hypertension, BPH and lumbar myelopathy who presented with worsening lower extremity weakness and difficulty ambulating for 5 weeks. Frequent falls at home, he has been using a walker, denies any bowel or urinary incontinence. On his initial physical examination blood pressure 131/84, heart rate 81, respiratory rate 17, oxygen saturation 93%, his lungs were clear to auscultation bilaterally, heart S1-S2, present rhythmic, soft abdomen, no lower extremity edema. Decreased strength upper extremities 4/5, lower extremities 2/5. Positive hyperreflexia.  Sodium 137, potassium 4.2, chloride 103, bicarb 24, glucose 98, BUN 13, creatinine 0.79, white count 9.7, hemoglobin 14.0, hematocrit 41.0, platelets 197. SARS COVID-19 negative. Urinalysis specific gravity 1.013, negative leukocytes.  Chest radiograph no infiltrates. EKG 66 bpm, left axis  deviation, left anterior fascicular block, right bundle branch block, normal QTC, sinus rhythm, no significant ST segment or T wave changes.  CT head, cervical spine with no acute intracranial pathology.  Soft tissue contusion of the right forehead.  No fracture or subluxation of the cervical spine.  Further work-up with brain MRI with several small areas of diffusion hyperintensity in the left parietal white matter and cortex.  Cervical spine MRI with worsening severe left C3-4 neural foraminal stenosis. Unchanged severe left C4-5 and right C5-6 neural foraminal stenosis. No spinal canal stenosis.  Thoracic and lumbar spine MRI with moderate left and mild right foraminal stenosis T9-T10 and T10-T11. Mild foraminal stenosis T11-T12. Mild canal stenosis T9-T10. Small central disc protrusion at T4-T5 and T5-T6. Severe foraminal stenosis on the right L2-L3 and L4-L5. Multilevel moderate foraminal stenosis. Moderate to severe canal stenosis and severe right subarticular recess stenosis L4-L5. Additional multilevel right subarticular recess narrowing.  Neurologic symptoms of parkinsonism, likely related to Abilify.This medication has been discontinued. PT and OT have recommended CIR but patient not candidate for this services.  Patient being transfer to SNF to continue with physical therapy  1.  Aripiprazole induced parkinsonism.  Patient was admitted to the medical ward, he was ruled out for cerebrovascular accident.  Patient was seen by neurology, recommendations to discontinue Ariprazole due to parkinsonian symptoms. Avoid further neuroleptic agents.  Patient was evaluated by physical therapy/Occupational Therapy, recommendations to continue therapy at a skilled nursing facility.  Patient symptoms slowly improving, not back to baseline yet.  2.  Vitamin B12 deficiency.  Vitamin B12 107, patient has been placed on B12 supplements.  3.  Hypertension/ sp aortic valve replacement.   Continue blood pressure control with carvedilol and spironolactone. Continue aspirin 325 mg daily.   4.  COPD.  No signs of acute exacerbation.  5.  Depression/bipolar.  Continue clonazepam and primidone. Psychiatry was consulted, recommendations to start Cymbalta 20 mg twice daily. Aripirazole was discontinued, monitor for signs of rebound depression, suicidal ideation or psychosis. Patient was evaluated by psychiatry, and he was found not a candidate for inpatient psychiatric treatment.  6. BPH. No signs of urinary retention, continue with tadalafil and tamsulosin.   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   albuterol 108 (90 Base) MCG/ACT inhaler Commonly known as: VENTOLIN HFA Inhale 2 puffs into the lungs every 6 (six) hours as needed for wheezing or shortness of breath.   aspirin 325 MG EC tablet Take 1 tablet (325 mg total) by mouth daily.   carvedilol 25 MG tablet Commonly known as: COREG TAKE 1 TABLET BY MOUTH  TWICE DAILY WITH MEALS What changed:   how much to take  how to take this  when to take this  additional instructions   celecoxib 100 MG capsule Commonly known as: CELEBREX Take 100 mg by mouth every morning.   clonazePAM 1 MG tablet Commonly known as: KLONOPIN Take 1 tablet (1 mg total) by mouth at bedtime.   cyanocobalamin 1000 MCG/ML injection Commonly known as: (VITAMIN B-12) Inject 1 mL (1,000 mcg  total) into the muscle daily for 15 days.   fluticasone 50 MCG/ACT nasal spray Commonly known as: FLONASE Place 1 spray into both nostrils at bedtime.   folic acid 1 MG tablet Commonly known as: FOLVITE Take 1 tablet (1 mg total) by mouth daily.   Krill Oil 1000 MG Caps Take 1,000 mg by mouth at bedtime.   Melatonin 10 MG Caps Take 10 mg by mouth at bedtime.   montelukast 10 MG tablet Commonly known as: SINGULAIR Take 10 mg by mouth at bedtime.   primidone 50 MG tablet Commonly known as: MYSOLINE Take 1 tablet (50 mg total) by mouth at bedtime.   spironolactone 25 MG tablet Commonly known as: ALDACTONE Take 0.5 tablets (12.5 mg total) by mouth daily. NEEDS AN APPOINTMENT FOR FURTHER REFILLS What changed: additional instructions   tadalafil 5 MG tablet Commonly known as: CIALIS Take 5 mg by mouth every morning.   tamsulosin 0.4 MG Caps capsule Commonly known as: FLOMAX Take 0.4 mg by mouth 2 (two) times daily after a meal.   Vitamin D (Ergocalciferol) 1.25 MG (50000 UNIT) Caps capsule Commonly known as: DRISDOL Take 50,000 Units by mouth every Monday.   Zinc 50 MG Tabs Take 50 mg by mouth at bedtime.       No Known Allergies  Consultations:  Neurology   Psychiatry    Procedures/Studies: CT HEAD WO CONTRAST  Result Date: 12/14/2020 CLINICAL DATA:  Fall, pain EXAM: CT HEAD WITHOUT CONTRAST CT CERVICAL SPINE WITHOUT CONTRAST TECHNIQUE: Multidetector CT imaging of the head and cervical spine was performed following the standard protocol without intravenous contrast. Multiplanar  CT image reconstructions of the cervical spine were also generated. COMPARISON:  None. FINDINGS: CT HEAD FINDINGS Brain: No evidence of acute infarction, hemorrhage, hydrocephalus, extra-axial collection or mass lesion/mass effect. Periventricular and deep white matter hypodensity. Vascular: No hyperdense vessel or unexpected calcification. Skull: Normal. Negative for fracture or focal  lesion. Sinuses/Orbits: No acute finding. Other: Soft tissue contusion of the right forehead (series 1, image 16). CT CERVICAL SPINE FINDINGS Alignment: Normal. Skull base and vertebrae: No acute fracture. No primary bone lesion or focal pathologic process. Soft tissues and spinal canal: No prevertebral fluid or swelling. No visible canal hematoma. Disc levels: Moderate multilevel disc space height loss and osteophytosis. Upper chest: Emphysema. Other: None. IMPRESSION: 1. No acute intracranial pathology. Small-vessel white matter disease. 2. Soft tissue contusion of the right forehead. 3. No fracture or static subluxation of the cervical spine. 4. Moderate multilevel disc space height loss and osteophytosis. 5. Emphysema in the included lung apices. Emphysema (ICD10-J43.9). Electronically Signed   By: Eddie Candle M.D.   On: 12/14/2020 17:07   CT CERVICAL SPINE WO CONTRAST  Result Date: 12/14/2020 CLINICAL DATA:  Fall, pain EXAM: CT HEAD WITHOUT CONTRAST CT CERVICAL SPINE WITHOUT CONTRAST TECHNIQUE: Multidetector CT imaging of the head and cervical spine was performed following the standard protocol without intravenous contrast. Multiplanar CT image reconstructions of the cervical spine were also generated. COMPARISON:  None. FINDINGS: CT HEAD FINDINGS Brain: No evidence of acute infarction, hemorrhage, hydrocephalus, extra-axial collection or mass lesion/mass effect. Periventricular and deep white matter hypodensity. Vascular: No hyperdense vessel or unexpected calcification. Skull: Normal. Negative for fracture or focal lesion. Sinuses/Orbits: No acute finding. Other: Soft tissue contusion of the right forehead (series 1, image 16). CT CERVICAL SPINE FINDINGS Alignment: Normal. Skull base and vertebrae: No acute fracture. No primary bone lesion or focal pathologic process. Soft tissues and spinal canal: No prevertebral fluid or swelling. No visible canal hematoma. Disc levels: Moderate multilevel disc space  height loss and osteophytosis. Upper chest: Emphysema. Other: None. IMPRESSION: 1. No acute intracranial pathology. Small-vessel white matter disease. 2. Soft tissue contusion of the right forehead. 3. No fracture or static subluxation of the cervical spine. 4. Moderate multilevel disc space height loss and osteophytosis. 5. Emphysema in the included lung apices. Emphysema (ICD10-J43.9). Electronically Signed   By: Eddie Candle M.D.   On: 12/14/2020 17:07   MR BRAIN WO CONTRAST  Result Date: 12/14/2020 CLINICAL DATA:  Acute neuro deficit EXAM: MRI HEAD WITHOUT CONTRAST TECHNIQUE: Multiplanar, multiecho pulse sequences of the brain and surrounding structures were obtained without intravenous contrast. COMPARISON:  CT head 12/14/2020 FINDINGS: Brain: Cluster of small areas of hyperintensity on diffusion-weighted imaging in the left parietal cortex and white matter. Possible acute or subacute infarct. No definite ADC correlate. Generalized atrophy. Mild to moderate white matter changes with periventricular deep white matter hyperintensities bilaterally. Small chronic infarct left cerebellum. Negative for hemorrhage or mass. Vascular: Normal arterial flow voids. Skull and upper cervical spine: Negative Sinuses/Orbits: Paranasal sinuses clear. Bilateral cataract extraction Other: None IMPRESSION: Several small areas of diffusion hyperintensity in the left parietal white matter and cortex. Possible acute or subacute infarct. Generalized atrophy. Mild to moderate chronic microvascular ischemic changes in the white matter. Electronically Signed   By: Franchot Gallo M.D.   On: 12/14/2020 20:57   MR Cervical Spine Wo Contrast  Result Date: 12/15/2020 CLINICAL DATA:  Bilateral arm and leg weakness for 1-2 months EXAM: MRI CERVICAL SPINE WITHOUT CONTRAST TECHNIQUE: Multiplanar, multisequence MR imaging of  the cervical spine was performed. No intravenous contrast was administered. COMPARISON:  MRI cervical spine  06/27/2018 and 10/09/17 FINDINGS: Alignment: Grade 1 anterolisthesis at C3-4 and C4-5. Straightening of normal cervical lordotic curvature. Vertebrae: No fracture, evidence of discitis, or bone lesion. Cord: Normal signal and morphology. Posterior Fossa, vertebral arteries, paraspinal tissues: Cystic focus of the right nasopharynx (12:1), unchanged since 10/09/17. Disc levels: C1-2: Unremarkable. C2-3: Left facet hypertrophy. No disc herniation. There is no spinal canal stenosis. No neural foraminal stenosis. C3-4: Left facet hypertrophy. Small disc bulge. There is no spinal canal stenosis. Worsened severe left neural foraminal stenosis. C4-5: Left facet hypertrophy. No disc herniation. There is no spinal canal stenosis. Unchanged severe left neural foraminal stenosis. C5-6: Left facet hypertrophy. Small disc bulge with bilateral uncovertebral osteophytes. There is no spinal canal stenosis. Unchanged severe right and moderate left neural foraminal stenosis. C6-7: Small disc bulge with uncovertebral spurring. There is no spinal canal stenosis. No neural foraminal stenosis. C7-T1: Normal disc space and facet joints. There is no spinal canal stenosis. Mild left neural foraminal stenosis. IMPRESSION: 1. Worsened severe left C3-4 neural foraminal stenosis. 2. Unchanged severe left C4-5 and right C5-6 neural foraminal stenosis. 3. No spinal canal stenosis. Electronically Signed   By: Ulyses Jarred M.D.   On: 12/15/2020 00:48   MR THORACIC SPINE WO CONTRAST  Result Date: 12/15/2020 CLINICAL DATA:  Low and mid back pain.  Neuro deficit EXAM: MRI THORACIC AND LUMBAR SPINE WITHOUT CONTRAST TECHNIQUE: Multiplanar and multiecho pulse sequences of the thoracic and lumbar spine were obtained without intravenous contrast. COMPARISON:  November 29, 2020. FINDINGS: MRI THORACIC SPINE FINDINGS Alignment:  Normal. Vertebrae: Vertebral body heights are maintained. Heterogeneous marrow. No specific evidence of acute fracture,  discitis/osteomyelitis, or suspicious bone lesion. Cord:  Normal cord signal. Paraspinal and other soft tissues: Unremarkable Disc levels: Disc bulging with bilateral facet hypertrophy at T9-T10 and T10-T11 results in moderate left and mild right foraminal stenosis. Mild canal stenosis at T9-T10 mild left foraminal stenosis at T11-T12. Small central disc protrusion at T4-T5 and T5-T6 which efface ventral CSF and flatten the ventral cord without significant canal stenosis. Right a centric small disc/osteophyte at T7-T8 partially effaces ventral CSF. MRI LUMBAR SPINE FINDINGS Segmentation:  Standard. Alignment: Levocurvature centered at L3. Similar grade 1 retrolisthesis of L2 on L3. Vertebrae: Similar heterogeneous bone marrow without focal lesion. Degenerative/discogenic endplate signal changes about the right L4-L5 disc. No specific evidence of acute fracture or discitis/osteomyelitis. Conus medullaris and cauda equina: Conus extends to the level. Conus and cauda equina appear normal. Paraspinal and other soft tissues: Similar cystic area posterior to the right posterior elements. Disc levels: T12-L1: Similar far left lateral/extraforaminal disc protrusion without impingement. No significant canal stenosis. L1-L2: Broad disc bulging and bilateral facet hypertrophy with similar narrowing of the right subarticular recess and similar moderate bilateral foraminal stenosis. L2-L3: Similar large right asymmetric disc bulge with right facet hypertrophy. Similar severe right foraminal stenosis. Similar right subarticular recess narrowing without significant central canal or left foraminal l stenosis. L3-L4: Large right asymmetric disc bulge with right greater than left facet hypertrophy. Similar moderate right foraminal stenosis and right subarticular recess stenosis. No significant central canal stenosis or left foraminal stenosis. L4-L5: Large right asymmetric disc bulge with severe right and moderate left facet  hypertrophy. Similar widening of the right facet joint with associated facet joint. Similar moderate to severe canal stenosis, severe right subarticular recess narrowing, and severe right and mild-to-moderate left foraminal stenosis. L5-S1: Left eccentric  disc bulge and moderate bilateral facet hypertrophy. Similar moderate left foraminal stenosis. No significant canal or right foraminal stenosis. IMPRESSION: MR LUMBAR SPINE IMPRESSION 1. In comparison to November 29, 2020, similar severe foraminal stenosis on the right at L2-L3 and L4-L5 and multilevel moderate foraminal stenosis, as detailed above. 2. Similar moderate to severe canal stenosis and severe right subarticular recess stenosis at L4-L5. Additional multilevel right subarticular recess narrowing, as detailed above. 3. Levocurvature, centered at L3. MR THORACIC SPINE IMPRESSION 1. Moderate left and mild right foraminal stenosis at T9-T10 and T10-T11. Mild left foraminal stenosis T11-T12. 2. Mild canal stenosis at T9-T10. Small central disc protrusions at T4-T5 and T5-T6 flatten the ventral cord without significant canal stenosis. Electronically Signed   By: Margaretha Sheffield MD   On: 12/15/2020 07:02   MR LUMBAR SPINE WO CONTRAST  Result Date: 12/15/2020 CLINICAL DATA:  Low and mid back pain.  Neuro deficit EXAM: MRI THORACIC AND LUMBAR SPINE WITHOUT CONTRAST TECHNIQUE: Multiplanar and multiecho pulse sequences of the thoracic and lumbar spine were obtained without intravenous contrast. COMPARISON:  November 29, 2020. FINDINGS: MRI THORACIC SPINE FINDINGS Alignment:  Normal. Vertebrae: Vertebral body heights are maintained. Heterogeneous marrow. No specific evidence of acute fracture, discitis/osteomyelitis, or suspicious bone lesion. Cord:  Normal cord signal. Paraspinal and other soft tissues: Unremarkable Disc levels: Disc bulging with bilateral facet hypertrophy at T9-T10 and T10-T11 results in moderate left and mild right foraminal stenosis. Mild canal  stenosis at T9-T10 mild left foraminal stenosis at T11-T12. Small central disc protrusion at T4-T5 and T5-T6 which efface ventral CSF and flatten the ventral cord without significant canal stenosis. Right a centric small disc/osteophyte at T7-T8 partially effaces ventral CSF. MRI LUMBAR SPINE FINDINGS Segmentation:  Standard. Alignment: Levocurvature centered at L3. Similar grade 1 retrolisthesis of L2 on L3. Vertebrae: Similar heterogeneous bone marrow without focal lesion. Degenerative/discogenic endplate signal changes about the right L4-L5 disc. No specific evidence of acute fracture or discitis/osteomyelitis. Conus medullaris and cauda equina: Conus extends to the level. Conus and cauda equina appear normal. Paraspinal and other soft tissues: Similar cystic area posterior to the right posterior elements. Disc levels: T12-L1: Similar far left lateral/extraforaminal disc protrusion without impingement. No significant canal stenosis. L1-L2: Broad disc bulging and bilateral facet hypertrophy with similar narrowing of the right subarticular recess and similar moderate bilateral foraminal stenosis. L2-L3: Similar large right asymmetric disc bulge with right facet hypertrophy. Similar severe right foraminal stenosis. Similar right subarticular recess narrowing without significant central canal or left foraminal l stenosis. L3-L4: Large right asymmetric disc bulge with right greater than left facet hypertrophy. Similar moderate right foraminal stenosis and right subarticular recess stenosis. No significant central canal stenosis or left foraminal stenosis. L4-L5: Large right asymmetric disc bulge with severe right and moderate left facet hypertrophy. Similar widening of the right facet joint with associated facet joint. Similar moderate to severe canal stenosis, severe right subarticular recess narrowing, and severe right and mild-to-moderate left foraminal stenosis. L5-S1: Left eccentric disc bulge and moderate  bilateral facet hypertrophy. Similar moderate left foraminal stenosis. No significant canal or right foraminal stenosis. IMPRESSION: MR LUMBAR SPINE IMPRESSION 1. In comparison to November 29, 2020, similar severe foraminal stenosis on the right at L2-L3 and L4-L5 and multilevel moderate foraminal stenosis, as detailed above. 2. Similar moderate to severe canal stenosis and severe right subarticular recess stenosis at L4-L5. Additional multilevel right subarticular recess narrowing, as detailed above. 3. Levocurvature, centered at L3. MR THORACIC SPINE IMPRESSION 1. Moderate left and  mild right foraminal stenosis at T9-T10 and T10-T11. Mild left foraminal stenosis T11-T12. 2. Mild canal stenosis at T9-T10. Small central disc protrusions at T4-T5 and T5-T6 flatten the ventral cord without significant canal stenosis. Electronically Signed   By: Margaretha Sheffield MD   On: 12/15/2020 07:02   MR LUMBAR SPINE WO CONTRAST  Result Date: 11/29/2020 CLINICAL DATA:  Neurogenic claudication EXAM: MRI LUMBAR SPINE WITHOUT CONTRAST TECHNIQUE: Multiplanar, multisequence MR imaging of the lumbar spine was performed. No intravenous contrast was administered. COMPARISON:  09/04/2019 FINDINGS: Segmentation:  Stand Alignment: Left convex scoliosis with apex at L3. Grade 1 retrolisthesis at L2-3. Vertebrae:  Heterogeneous bone marrow signal.  No acute abnormality. Conus medullaris and cauda equina: Conus extends to the L1-2 level. Conus and cauda equina appear normal. Paraspinal and other soft tissues: Unchanged cystic focus posterior to the right posterior elements. Disc levels: T12-L1: Normal disc space and facets. No spinal canal or neuroforaminal stenosis. L1-L2: Right asymmetric disc bulge. Narrowing of the right lateral recess with mild central spinal canal stenosis. Moderate bilateral neural foraminal stenosis. L2-L3: Large right asymmetric disc bulge. Mild narrowing of the right lateral recess. Severe right and mild left  foraminal stenosis. No central spinal canal stenosis. L3-L4: Large right asymmetric disc bulge. No spinal canal stenosis. Moderate right foraminal stenosis. L4-L5: Large disc bulge with severe facet arthrosis and widening of the right facet, unchanged. Moderate spinal canal stenosis. Unchanged severe right and mild left foraminal stenosis. L5-S1: Mild disc bulge. Moderate left foraminal stenosis. No spinal canal stenosis. Visualized sacrum: Normal. IMPRESSION: 1. Unchanged severe right L2-L3 and L4-L5 neural foraminal stenosis. 2. Moderate multilevel neural foraminal stenosis, unchanged Electronically Signed   By: Ulyses Jarred M.D.   On: 11/29/2020 23:34   DG Chest Port 1 View  Result Date: 12/14/2020 CLINICAL DATA:  Weakness. EXAM: PORTABLE CHEST 1 VIEW COMPARISON:  June 08, 2018. FINDINGS: Stable cardiomediastinal silhouette. Status post aortic root repair. No pneumothorax or pleural effusion is noted. Lungs are clear. Bony thorax is unremarkable. IMPRESSION: No acute cardiopulmonary abnormality seen. Aortic Atherosclerosis (ICD10-I70.0). Electronically Signed   By: Marijo Conception M.D.   On: 12/14/2020 16:37      Subjective: Patient is feeling better slowly improving but not yet back to his baseline, no nausea or vomiting, no dyspnea or chest pain,.   Discharge Exam: Vitals:   12/20/20 0348 12/20/20 0724  BP: 109/75 110/85  Pulse: 62 68  Resp: 16 16  Temp: 98 F (36.7 C) 97.8 F (36.6 C)  SpO2: 96% 95%   Vitals:   12/19/20 2039 12/19/20 2349 12/20/20 0348 12/20/20 0724  BP: 111/78 101/71 109/75 110/85  Pulse: 74 62 62 68  Resp: 18 15 16 16   Temp: 98.7 F (37.1 C) 97.8 F (36.6 C) 98 F (36.7 C) 97.8 F (36.6 C)  TempSrc: Oral Oral  Oral  SpO2: 97% 96% 96% 95%    General: Not in pain or dyspnea Neurology: Awake and alert, generalized weakness and improving rigidity  E ENT: no pallor, no icterus, oral mucosa moist Cardiovascular: No JVD. S1-S2 present, rhythmic, no  gallops, rubs, or murmurs. No lower extremity edema. Pulmonary: vesicular breath sounds bilaterally, adequate air movement, no wheezing, rhonchi or rales. Gastrointestinal. Abdomen soft and non tender Skin. No rashes Musculoskeletal: no joint deformities   The results of significant diagnostics from this hospitalization (including imaging, microbiology, ancillary and laboratory) are listed below for reference.     Microbiology: Recent Results (from the past 240 hour(s))  Resp Panel by RT-PCR (Flu A&B, Covid) Nasopharyngeal Swab     Status: None   Collection Time: 12/14/20  5:49 PM   Specimen: Nasopharyngeal Swab; Nasopharyngeal(NP) swabs in vial transport medium  Result Value Ref Range Status   SARS Coronavirus 2 by RT PCR NEGATIVE NEGATIVE Final    Comment: (NOTE) SARS-CoV-2 target nucleic acids are NOT DETECTED.  The SARS-CoV-2 RNA is generally detectable in upper respiratory specimens during the acute phase of infection. The lowest concentration of SARS-CoV-2 viral copies this assay can detect is 138 copies/mL. A negative result does not preclude SARS-Cov-2 infection and should not be used as the sole basis for treatment or other patient management decisions. A negative result may occur with  improper specimen collection/handling, submission of specimen other than nasopharyngeal swab, presence of viral mutation(s) within the areas targeted by this assay, and inadequate number of viral copies(<138 copies/mL). A negative result must be combined with clinical observations, patient history, and epidemiological information. The expected result is Negative.  Fact Sheet for Patients:  EntrepreneurPulse.com.au  Fact Sheet for Healthcare Providers:  IncredibleEmployment.be  This test is no t yet approved or cleared by the [place] FDA and  has been authorized for detection and/or diagnosis of SARS-CoV-2 by FDA under an Emergency Use  Authorization (EUA). This EUA will remain  in effect (meaning this test can be used) for the duration of the COVID-19 declaration under Section 564(b)(1) of the Act, 21 U.S.C.section 360bbb-3(b)(1), unless the authorization is terminated  or revoked sooner.       Influenza A by PCR NEGATIVE NEGATIVE Final   Influenza B by PCR NEGATIVE NEGATIVE Final    Comment: (NOTE) The Xpert Xpress SARS-CoV-2/FLU/RSV plus assay is intended as an aid in the diagnosis of influenza from Nasopharyngeal swab specimens and should not be used as a sole basis for treatment. Nasal washings and aspirates are unacceptable for Xpert Xpress SARS-CoV-2/FLU/RSV testing.  Fact Sheet for Patients: EntrepreneurPulse.com.au  Fact Sheet for Healthcare Providers: IncredibleEmployment.be  This test is not yet approved or cleared by the [place] FDA and has been authorized for detection and/or diagnosis of SARS-CoV-2 by FDA under an Emergency Use Authorization (EUA). This EUA will remain in effect (meaning this test can be used) for the duration of the COVID-19 declaration under Section 564(b)(1) of the Act, 21 U.S.C. section 360bbb-3(b)(1), unless the authorization is terminated or revoked.  Performed at [facility], [place] [address], [place], [place] [address]   SARS CORONAVIRUS 2 (TAT 6-24 HRS) Nasopharyngeal Nasopharyngeal Swab     Status: None   Collection Time: 12/19/20  1:07 PM   Specimen: Nasopharyngeal Swab  Result Value Ref Range Status   SARS Coronavirus 2 NEGATIVE NEGATIVE Final    Comment: (NOTE) SARS-CoV-2 target nucleic acids are NOT DETECTED.  The SARS-CoV-2 RNA is generally detectable in upper and lower respiratory specimens during the acute phase of infection. Negative results do not preclude SARS-CoV-2 infection, do not rule out co-infections with other pathogens, and should not be used as the sole basis for treatment or other patient  management decisions. Negative results must be combined with clinical observations, patient history, and epidemiological information. The expected result is Negative.  Fact Sheet for Patients: SugarRoll.be  Fact Sheet for Healthcare Providers: https://www.woods-mathews.com/  This test is not yet approved or cleared by the [place] FDA and  has been authorized for detection and/or diagnosis of SARS-CoV-2 by FDA under an Emergency Use Authorization (EUA). This EUA will remain  in effect (meaning this test can be used) for the duration of the COVID-19 declaration under Se ction 564(b)(1) of the Act, 21 U.S.C. section 360bbb-3(b)(1), unless the authorization is terminated or revoked sooner.  Performed at [facility], [place] [address], [place], [place] [address]      Labs: BNP (last 3 results) No results for input(s): BNP in the last 8760 hours. Basic Metabolic Panel: Recent Labs  Lab 12/14/20 1618 12/15/20 0745  NA 137 135  K 4.2 3.8  CL 103 102  CO2 24 26  GLUCOSE 98 103*  BUN 13 11  CREATININE 0.79 0.74  CALCIUM 8.7* 9.0   Liver Function Tests: Recent Labs  Lab 12/14/20 1618  AST 12*  ALT 9  ALKPHOS 57  BILITOT 0.8  PROT 5.9*  ALBUMIN 3.1*   No results for input(s): LIPASE, AMYLASE in the last 168 hours. No results for input(s): AMMONIA in the last 168 hours. CBC: Recent Labs  Lab 12/14/20 1618 12/15/20 0745  WBC 9.7 8.6  HGB 14.0 14.0  HCT 41.0 40.6  MCV 100.5* 101.2*  PLT 195 182   Cardiac Enzymes: No results for input(s): CKTOTAL, CKMB, CKMBINDEX, TROPONINI in the last 168 hours. BNP: Invalid input(s): POCBNP CBG: No results for input(s): GLUCAP in the last 168 hours. D-Dimer No results for input(s): DDIMER in the last 72 hours. Hgb A1c No results for input(s): HGBA1C in the last 72 hours. Lipid Profile No results for input(s): CHOL, HDL, LDLCALC, TRIG, CHOLHDL, LDLDIRECT in the last 72  hours. Thyroid function studies No results for input(s): TSH, T4TOTAL, T3FREE, THYROIDAB in the last 72 hours.  Invalid input(s): FREET3 Anemia work up No results for input(s): VITAMINB12, FOLATE, FERRITIN, TIBC, IRON, RETICCTPCT in the last 72 hours. Urinalysis    Component Value Date/Time   COLORURINE YELLOW 12/14/2020 1701   APPEARANCEUR CLEAR 12/14/2020 1701   LABSPEC 1.013 12/14/2020 1701   PHURINE 6.0 12/14/2020 1701   GLUCOSEU NEGATIVE 12/14/2020 1701   HGBUR NEGATIVE 12/14/2020 1701   BILIRUBINUR NEGATIVE 12/14/2020 1701   KETONESUR NEGATIVE 12/14/2020 1701   PROTEINUR NEGATIVE 12/14/2020 1701   NITRITE NEGATIVE 12/14/2020 1701   LEUKOCYTESUR NEGATIVE 12/14/2020 1701   Sepsis Labs Invalid input(s): PROCALCITONIN,  WBC,  LACTICIDVEN Microbiology Recent Results (from the past 240 hour(s))  Resp Panel by RT-PCR (Flu A&B, Covid) Nasopharyngeal Swab     Status: None   Collection Time: 12/14/20  5:49 PM   Specimen: Nasopharyngeal Swab; Nasopharyngeal(NP) swabs in vial transport medium  Result Value Ref Range Status   SARS Coronavirus 2 by RT PCR NEGATIVE NEGATIVE Final    Comment: (NOTE) SARS-CoV-2 target nucleic acids are NOT DETECTED.  The SARS-CoV-2 RNA is generally detectable in upper respiratory specimens during the acute phase of infection. The lowest concentration of SARS-CoV-2 viral copies this assay can detect is 138 copies/mL. A negative result does not preclude SARS-Cov-2 infection and should not be used as the sole basis for treatment or other patient management decisions. A negative result may occur with  improper specimen collection/handling, submission of specimen other than nasopharyngeal swab, presence of viral mutation(s) within the areas targeted by this assay, and inadequate number of viral copies(<138 copies/mL). A negative result must be combined with clinical observations, patient history, and epidemiological information. The expected result is  Negative.  Fact Sheet for Patients:  EntrepreneurPulse.com.au  Fact Sheet for Healthcare Providers:  IncredibleEmployment.be  This test is no t yet approved or cleared by the [place] and  has been authorized for detection and/or diagnosis of SARS-CoV-2 by FDA under an Emergency Use Authorization (EUA). This EUA will remain  in effect (meaning this test can be used) for the duration of the COVID-19 declaration under Section 564(b)(1) of the Act, 21 U.S.C.section 360bbb-3(b)(1), unless the authorization is terminated  or revoked sooner.       Influenza A by PCR NEGATIVE NEGATIVE Final   Influenza B by PCR NEGATIVE NEGATIVE Final    Comment: (NOTE) The Xpert Xpress SARS-CoV-2/FLU/RSV plus assay is intended as an aid in the diagnosis of influenza from Nasopharyngeal swab specimens and should not be used as a sole basis for treatment. Nasal washings and aspirates are unacceptable for Xpert Xpress SARS-CoV-2/FLU/RSV testing.  Fact Sheet for Patients: EntrepreneurPulse.com.au  Fact Sheet for Healthcare Providers: IncredibleEmployment.be  This test is not yet approved or cleared by the [place] FDA and has been authorized for detection and/or diagnosis of SARS-CoV-2 by FDA under an Emergency Use Authorization (EUA). This EUA will remain in effect (meaning this test can be used) for the duration of the COVID-19 declaration under Section 564(b)(1) of the Act, 21 U.S.C. section 360bbb-3(b)(1), unless the authorization is terminated or revoked.  Performed at [facility], [place] [address], [place], [place] [address]   SARS CORONAVIRUS 2 (TAT 6-24 HRS) Nasopharyngeal Nasopharyngeal Swab     Status: None   Collection Time: 12/19/20  1:07 PM   Specimen: Nasopharyngeal Swab  Result Value Ref Range Status   SARS Coronavirus 2 NEGATIVE NEGATIVE Final    Comment: (NOTE) SARS-CoV-2 target nucleic  acids are NOT DETECTED.  The SARS-CoV-2 RNA is generally detectable in upper and lower respiratory specimens during the acute phase of infection. Negative results do not preclude SARS-CoV-2 infection, do not rule out co-infections with other pathogens, and should not be used as the sole basis for treatment or other patient management decisions. Negative results must be combined with clinical observations, patient history, and epidemiological information. The expected result is Negative.  Fact Sheet for Patients: SugarRoll.be  Fact Sheet for Healthcare Providers: https://www.woods-mathews.com/  This test is not yet approved or cleared by the [place] FDA and  has been authorized for detection and/or diagnosis of SARS-CoV-2 by FDA under an Emergency Use Authorization (EUA). This EUA will remain  in effect (meaning this test can be used) for the duration of the COVID-19 declaration under Se ction 564(b)(1) of the Act, 21 U.S.C. section 360bbb-3(b)(1), unless the authorization is terminated or revoked sooner.  Performed at [facility], [place] [address], [place], [place] [address]      Time coordinating discharge: 45 minutes  SIGNED:   Tawni Millers, MD  [facility] 12/20/2020, 8:30 AM

## 2020-12-20 NOTE — TOC Transition Note (Signed)
Transition of Care [facility]) - CM/SW Discharge Note   Patient Details  Name: Travis Reese MRN: [id number] Date of Birth: Aug 09, 1946  Transition of Care [facility]) CM/SW Contact:  Marney Setting, [place] Work Phone Number: 12/20/2020, 2:37 PM   Clinical Narrative:   Nurse to call report to [phone] ask for nurse who has hall [address].    Final next level of care: Skilled Nursing Facility Barriers to Discharge: No Barriers Identified   Patient Goals and CMS Choice Patient states their goals for this hospitalization and ongoing recovery are:: get back home CMS Medicare.gov Compare Post Acute Care list provided to:: Patient Represenative (must comment) Choice offered to / list presented to : Adult Children  Discharge Placement              Patient chooses bed at: [facility] Patient to be transferred to facility by: [facility] Name of family member notified: Christi Patient and family notified of of transfer: 12/20/20  Discharge Plan and Services     Post Acute Care Choice: [facility]                               Social Determinants of Health (SDOH) Interventions     Readmission Risk Interventions Readmission Risk Prevention Plan 05/08/2018  Transportation Screening Complete  PCP or Specialist Appt within 5-7 Days Complete  Home Care Screening Complete  Medication Review (RN CM) Complete  Some recent data might be hidden

## 2020-12-21 DIAGNOSIS — M6281 Muscle weakness (generalized): Secondary | ICD-10-CM | POA: Diagnosis not present

## 2020-12-21 DIAGNOSIS — G20 Parkinson's disease: Secondary | ICD-10-CM | POA: Diagnosis not present

## 2020-12-26 DIAGNOSIS — M6281 Muscle weakness (generalized): Secondary | ICD-10-CM | POA: Diagnosis not present

## 2020-12-26 DIAGNOSIS — I110 Hypertensive heart disease with heart failure: Secondary | ICD-10-CM | POA: Diagnosis not present

## 2020-12-26 DIAGNOSIS — G20 Parkinson's disease: Secondary | ICD-10-CM | POA: Diagnosis not present

## 2020-12-26 DIAGNOSIS — I5030 Unspecified diastolic (congestive) heart failure: Secondary | ICD-10-CM | POA: Diagnosis not present

## 2021-01-01 DIAGNOSIS — R238 Other skin changes: Secondary | ICD-10-CM | POA: Diagnosis not present

## 2021-01-01 DIAGNOSIS — G20 Parkinson's disease: Secondary | ICD-10-CM | POA: Diagnosis not present

## 2021-01-04 DIAGNOSIS — L89610 Pressure ulcer of right heel, unstageable: Secondary | ICD-10-CM | POA: Diagnosis not present

## 2021-01-12 DIAGNOSIS — M4802 Spinal stenosis, cervical region: Secondary | ICD-10-CM | POA: Diagnosis not present

## 2021-01-12 DIAGNOSIS — L89612 Pressure ulcer of right heel, stage 2: Secondary | ICD-10-CM | POA: Diagnosis not present

## 2021-01-12 DIAGNOSIS — Z7902 Long term (current) use of antithrombotics/antiplatelets: Secondary | ICD-10-CM | POA: Diagnosis not present

## 2021-01-12 DIAGNOSIS — M4805 Spinal stenosis, thoracolumbar region: Secondary | ICD-10-CM | POA: Diagnosis not present

## 2021-01-12 DIAGNOSIS — D692 Other nonthrombocytopenic purpura: Secondary | ICD-10-CM | POA: Diagnosis not present

## 2021-01-12 DIAGNOSIS — G20 Parkinson's disease: Secondary | ICD-10-CM | POA: Diagnosis not present

## 2021-01-12 DIAGNOSIS — J449 Chronic obstructive pulmonary disease, unspecified: Secondary | ICD-10-CM | POA: Diagnosis not present

## 2021-01-12 DIAGNOSIS — G959 Disease of spinal cord, unspecified: Secondary | ICD-10-CM | POA: Diagnosis not present

## 2021-01-12 DIAGNOSIS — I5030 Unspecified diastolic (congestive) heart failure: Secondary | ICD-10-CM | POA: Diagnosis not present

## 2021-01-12 DIAGNOSIS — I110 Hypertensive heart disease with heart failure: Secondary | ICD-10-CM | POA: Diagnosis not present

## 2021-01-14 DIAGNOSIS — M4802 Spinal stenosis, cervical region: Secondary | ICD-10-CM | POA: Diagnosis not present

## 2021-01-14 DIAGNOSIS — Z7902 Long term (current) use of antithrombotics/antiplatelets: Secondary | ICD-10-CM | POA: Diagnosis not present

## 2021-01-14 DIAGNOSIS — J449 Chronic obstructive pulmonary disease, unspecified: Secondary | ICD-10-CM | POA: Diagnosis not present

## 2021-01-14 DIAGNOSIS — D692 Other nonthrombocytopenic purpura: Secondary | ICD-10-CM | POA: Diagnosis not present

## 2021-01-14 DIAGNOSIS — G20 Parkinson's disease: Secondary | ICD-10-CM | POA: Diagnosis not present

## 2021-01-14 DIAGNOSIS — I5030 Unspecified diastolic (congestive) heart failure: Secondary | ICD-10-CM | POA: Diagnosis not present

## 2021-01-14 DIAGNOSIS — L89612 Pressure ulcer of right heel, stage 2: Secondary | ICD-10-CM | POA: Diagnosis not present

## 2021-01-14 DIAGNOSIS — I110 Hypertensive heart disease with heart failure: Secondary | ICD-10-CM | POA: Diagnosis not present

## 2021-01-14 DIAGNOSIS — M4805 Spinal stenosis, thoracolumbar region: Secondary | ICD-10-CM | POA: Diagnosis not present

## 2021-01-14 DIAGNOSIS — G959 Disease of spinal cord, unspecified: Secondary | ICD-10-CM | POA: Diagnosis not present

## 2021-01-15 DIAGNOSIS — D649 Anemia, unspecified: Secondary | ICD-10-CM | POA: Diagnosis not present

## 2021-01-15 DIAGNOSIS — Z6824 Body mass index (BMI) 24.0-24.9, adult: Secondary | ICD-10-CM | POA: Diagnosis not present

## 2021-01-15 DIAGNOSIS — G2119 Other drug induced secondary parkinsonism: Secondary | ICD-10-CM | POA: Diagnosis not present

## 2021-01-15 DIAGNOSIS — R29898 Other symptoms and signs involving the musculoskeletal system: Secondary | ICD-10-CM | POA: Diagnosis not present

## 2021-01-15 DIAGNOSIS — E538 Deficiency of other specified B group vitamins: Secondary | ICD-10-CM | POA: Diagnosis not present

## 2021-01-15 DIAGNOSIS — Z79899 Other long term (current) drug therapy: Secondary | ICD-10-CM | POA: Diagnosis not present

## 2021-01-16 DIAGNOSIS — I110 Hypertensive heart disease with heart failure: Secondary | ICD-10-CM | POA: Diagnosis not present

## 2021-01-16 DIAGNOSIS — J449 Chronic obstructive pulmonary disease, unspecified: Secondary | ICD-10-CM | POA: Diagnosis not present

## 2021-01-16 DIAGNOSIS — Z6824 Body mass index (BMI) 24.0-24.9, adult: Secondary | ICD-10-CM | POA: Diagnosis not present

## 2021-01-16 DIAGNOSIS — I5030 Unspecified diastolic (congestive) heart failure: Secondary | ICD-10-CM | POA: Diagnosis not present

## 2021-01-16 DIAGNOSIS — E538 Deficiency of other specified B group vitamins: Secondary | ICD-10-CM | POA: Diagnosis not present

## 2021-01-16 DIAGNOSIS — I10 Essential (primary) hypertension: Secondary | ICD-10-CM | POA: Diagnosis not present

## 2021-01-16 DIAGNOSIS — G20 Parkinson's disease: Secondary | ICD-10-CM | POA: Diagnosis not present

## 2021-01-16 DIAGNOSIS — I509 Heart failure, unspecified: Secondary | ICD-10-CM | POA: Diagnosis not present

## 2021-01-16 DIAGNOSIS — G959 Disease of spinal cord, unspecified: Secondary | ICD-10-CM | POA: Diagnosis not present

## 2021-01-16 DIAGNOSIS — Z79899 Other long term (current) drug therapy: Secondary | ICD-10-CM | POA: Diagnosis not present

## 2021-01-16 DIAGNOSIS — M4805 Spinal stenosis, thoracolumbar region: Secondary | ICD-10-CM | POA: Diagnosis not present

## 2021-01-16 DIAGNOSIS — M4802 Spinal stenosis, cervical region: Secondary | ICD-10-CM | POA: Diagnosis not present

## 2021-01-16 DIAGNOSIS — E559 Vitamin D deficiency, unspecified: Secondary | ICD-10-CM | POA: Diagnosis not present

## 2021-01-16 DIAGNOSIS — D649 Anemia, unspecified: Secondary | ICD-10-CM | POA: Diagnosis not present

## 2021-01-16 DIAGNOSIS — Z7902 Long term (current) use of antithrombotics/antiplatelets: Secondary | ICD-10-CM | POA: Diagnosis not present

## 2021-01-16 DIAGNOSIS — M25472 Effusion, left ankle: Secondary | ICD-10-CM | POA: Diagnosis not present

## 2021-01-16 DIAGNOSIS — E785 Hyperlipidemia, unspecified: Secondary | ICD-10-CM | POA: Diagnosis not present

## 2021-01-16 DIAGNOSIS — L89612 Pressure ulcer of right heel, stage 2: Secondary | ICD-10-CM | POA: Diagnosis not present

## 2021-01-16 DIAGNOSIS — M25471 Effusion, right ankle: Secondary | ICD-10-CM | POA: Diagnosis not present

## 2021-01-16 DIAGNOSIS — D692 Other nonthrombocytopenic purpura: Secondary | ICD-10-CM | POA: Diagnosis not present

## 2021-01-16 NOTE — Progress Notes (Signed)
Assessment/Plan:   1.  Parkinsonism with tremor  -I had a long counseling session with the patient today.  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  I explained that one clinically cannot tell the difference between idiopathic parkinsons disease and secondary parkinsonism from medication.  I also explained that even if one is able to get off of the medication, it can take up to 6 months to clinically definitively know if this is idiopathic parkinsons disease.  Patient was just taken off of the Abilify about 4 weeks ago.  -A complicating factor in this patient is that he does have a family history of Parkinson's disease in his father, and the patient has had a long-term history of essential tremor.  Generally, Parkinson's disease is not hereditary, but there are certainly hereditary forms of Parkinson's disease.  Given that he is significantly disabled from the parkinsonism and declining, I am not sure that we should wait a full 6 months for a more definitive answer.  We are going to go ahead and get a DaTscan.    2.  Acute/subacute infarct   -MRI did show several areas of DWI positive lesions in the left parietal and white matter cortex.  I do think that the patient had new strokes, which were unfortunately not addressed at the hospital.  However, also discussed with patient and family that these are likely not the cause of his symptoms.  He certainly could have had some new infarcts that made him more weak and caused him to have more trouble walking, but he really describes nothing focal or lateralizing on examination.  His right leg was a tad weaker than the left, but not sure if that is related to parkinsonism or chronic.  -We discussed the diagnosis as well as pathophysiology of the disease.  We discussed signs and sx's of stroke and importance of calling 911 immediately should he have any of these.  Patient education was provided.    -Talked about  stroke risk factors which include tobacco use, HTN, hyperlipidemia, previous hx of stroke, age.  Talked about those risk factors which are modifiable.  -Talked about importance of blood pressure control with a goal <130/80 mm Hg.   -Talked about importance of lipid control and proper diet.  Lipids should be managed intensively, with a goal LDL < 70 mg/dL.  His current lipids from 2 days ago are well controlled with HDL of 55 and LDL 70.  -I counseled the patient on measures to reduce stroke risk, including the importance of medication compliance, risk factor control, exercise, healthy diet, and avoidance of smoking.    -I will schedule carotid ultrasound and echo with bubble/saline contrast  -Patient already on aspirin.  -pt doing PT/OT at home now   Subjective:   Travis Reese was seen today in the movement disorders clinic for neurologic consultation at the request of [place], PA-C.   I have seen the patient in the past for essential tremor and he was on primidone then.  He ended up stopping that medication because he was having some hangover effect (but he was also on tizanidine and clonazepam).  Apparently, that got restarted by primary care after our last visit.  Pt accompanied by significant other [name]) who supplements the hx.  Daughter on phone and participates.  Patient presented to the emergency room on April 14 with complaints of weakness and trouble walking.  Patient was seen by neurology, Dr. Leonel Ramsay,  while in the hospital.  There was initially concern that his walking issues were due to acute myelopathy.  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  However, this was not felt the cause of his issues.  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  His Abilify was held and he has been off of that since April 16.  He was started on Cymbalta instead.  Interestingly, MRI of the brain did demonstrate several areas of DWI positive lesions in the left parietal white matter and cortex.  It does not appear that this was addressed at the hospital.  Significant other states that slowness started in December.  He would walk and legs would turn to "jelly" and he would ease to the ground.  Daughter states that this started mid March.  They state that they went to the hospital in April because of progressive weakness.    Pt went to SNF rehab following hospital stay and did rehab until 5/7.  Noting initially did well but now having trouble again so PT just started in the home.  Generally walking with the walker.     Specific Symptoms:  Tremor: Yes.   with eating but not at rest Family hx of similar:  Yes.  Father with Parkinson's disease Voice: weaker/softer Wet Pillows: No. Bradykinesia symptoms: difficulty getting out of a chair and difficulty regaining balance (have a lift chair but doesn't sleep in that - sleeping in bed and trouble getting in and out of it) Urinary Incontinence:  Yes.  , has urgency and cannot make it to the RR. Trouble with ADL's:  Yes.  , significant other assisting with this  Trouble buttoning clothing: Yes.   Depression:  Yes.   Memory changes:  No. Hallucinations:  No.  visual distortions: No. N/V:  No. Lightheaded:  No.  Syncope: No. Prior exposure to reglan/antipsychotics: Yes.      ALLERGIES:  No Known Allergies  CURRENT MEDICATIONS:  Current Outpatient Medications  Medication Instructions  . acetaminophen (TYLENOL) 1,000 mg, Oral, Every 8 hours PRN  . albuterol (VENTOLIN HFA) 108 (90 Base) MCG/ACT inhaler 2 puffs, Inhalation, Every 6 hours PRN  . aspirin 325 mg, Oral, Daily  . carvedilol (COREG) 25 MG tablet TAKE 1 TABLET BY MOUTH  TWICE DAILY WITH MEALS  . celecoxib  (CELEBREX) 100 mg, Oral, Every morning  . clonazePAM (KLONOPIN) 1 mg, Oral, Daily at bedtime  . fluticasone (FLONASE) 50 MCG/ACT nasal spray 1 spray, Each Nare, Daily at bedtime  . folic acid (FOLVITE) 1 mg, Oral, Daily  . Krill Oil 1,000 mg, Oral, Daily at bedtime  . Melatonin 10 mg, Oral, Daily at bedtime  . montelukast (SINGULAIR) 10 mg, Oral, Daily at bedtime  . primidone (MYSOLINE) 50 mg, Oral, Daily at bedtime  . spironolactone (ALDACTONE) 12.5 mg, Oral, Daily, NEEDS AN APPOINTMENT FOR FURTHER REFILLS  . tadalafil (CIALIS) 5 mg, Oral, Every morning  . tamsulosin (FLOMAX) 0.4 mg, Oral, 2 times daily after meals  . Vitamin D (Ergocalciferol) (DRISDOL) 50,000 Units, Oral, Every Mon  . Zinc 50 mg, Oral, Daily at bedtime    Objective:   VITALS:   Vitals:   01/18/21 0938  BP: 120/62  Weight: 187 lb (84.8 kg)  Height: 6\' 2"  (1.88 m)    GEN:  The patient appears stated age and is in NAD. HEENT:  Normocephalic, atraumatic.  The mucous membranes are moist. The superficial temporal arteries are without ropiness or tenderness. CV:  RRR Lungs:  CTAB Neck/HEME:  There are no carotid bruits bilaterally.  Neurological examination:  Orientation: The patient is alert and oriented x3.  Cranial nerves: There is good facial symmetry. There is facial hypomimia.  Extraocular muscles are intact. The visual fields are full to confrontational testing. The speech is fluent and clear. Soft palate rises symmetrically and there is no tongue deviation. Hearing is intact to conversational tone. Sensation: Sensation is intact to light and pinprick throughout (facial, trunk, extremities).  No extinction with double simultaneous stimulation. Motor: Strength is 5/5 in the bilateral upper and left lower extremities.  Strength is 4+-5-/5 in the right lower extremity.  Shoulder shrug is equal and symmetric.  There is no pronator drift.   Movement examination: Tone: There is at least moderate increased tone in  the bilateral upper extremities.  Pt slumped to the L in WC Abnormal movements: He does have postural tremor.  He has rare left upper extremity rest tremor. Coordination:  There is decremation with RAM's, with any form of RAMS, including alternating supination and pronation of the forearm, hand opening and closing, finger taps, heel taps and toe taps bilaterally Gait and Station: The patient requires mild assist OOC.  He is given a walker.  He is slow and shuffles.     I have reviewed and interpreted the following labs independently   Chemistry      Component Value Date/Time   NA 135 12/15/2020 0745   K 3.8 12/15/2020 0745   CL 102 12/15/2020 0745   CO2 26 12/15/2020 0745   BUN 11 12/15/2020 0745   CREATININE 0.74 12/15/2020 0745   CREATININE 0.77 07/21/2017 1401      Component Value Date/Time   CALCIUM 9.0 12/15/2020 0745   ALKPHOS 57 12/14/2020 1618   AST 12 (L) 12/14/2020 1618   ALT 9 12/14/2020 1618   BILITOT 0.8 12/14/2020 1618      Lab Results  Component Value Date   TSH 2.17 03/30/2020   Lab Results  Component Value Date   WBC 8.6 12/15/2020   HGB 14.0 12/15/2020   HCT 40.6 12/15/2020   MCV 101.2 (H) 12/15/2020   PLT 182 12/15/2020   Primary care sent over lab work from May 17.  Vitamin B12 was 684.  Total cholesterol was 138, HDL 55, LDL 70  Total time spent on today's visit was 70 minutes, including both face-to-face time and nonface-to-face time.  Time included that spent on review of records (prior notes available to me/labs/imaging if pertinent), discussing treatment and goals, answering patient's questions and coordinating care.  Cc:  O'Buch, Greta, PA-C

## 2021-01-17 DIAGNOSIS — I110 Hypertensive heart disease with heart failure: Secondary | ICD-10-CM | POA: Diagnosis not present

## 2021-01-17 DIAGNOSIS — D692 Other nonthrombocytopenic purpura: Secondary | ICD-10-CM | POA: Diagnosis not present

## 2021-01-17 DIAGNOSIS — M4805 Spinal stenosis, thoracolumbar region: Secondary | ICD-10-CM | POA: Diagnosis not present

## 2021-01-17 DIAGNOSIS — G20 Parkinson's disease: Secondary | ICD-10-CM | POA: Diagnosis not present

## 2021-01-17 DIAGNOSIS — G959 Disease of spinal cord, unspecified: Secondary | ICD-10-CM | POA: Diagnosis not present

## 2021-01-17 DIAGNOSIS — I5030 Unspecified diastolic (congestive) heart failure: Secondary | ICD-10-CM | POA: Diagnosis not present

## 2021-01-17 DIAGNOSIS — Z7902 Long term (current) use of antithrombotics/antiplatelets: Secondary | ICD-10-CM | POA: Diagnosis not present

## 2021-01-17 DIAGNOSIS — L89612 Pressure ulcer of right heel, stage 2: Secondary | ICD-10-CM | POA: Diagnosis not present

## 2021-01-17 DIAGNOSIS — M4802 Spinal stenosis, cervical region: Secondary | ICD-10-CM | POA: Diagnosis not present

## 2021-01-17 DIAGNOSIS — J449 Chronic obstructive pulmonary disease, unspecified: Secondary | ICD-10-CM | POA: Diagnosis not present

## 2021-01-18 ENCOUNTER — Ambulatory Visit: Payer: Medicare Other | Admitting: Neurology

## 2021-01-18 ENCOUNTER — Other Ambulatory Visit: Payer: Self-pay

## 2021-01-18 ENCOUNTER — Encounter: Payer: Self-pay | Admitting: Neurology

## 2021-01-18 VITALS — BP 120/62 | Ht 74.0 in | Wt 187.0 lb

## 2021-01-18 DIAGNOSIS — I639 Cerebral infarction, unspecified: Secondary | ICD-10-CM | POA: Diagnosis not present

## 2021-01-18 DIAGNOSIS — G2111 Neuroleptic induced parkinsonism: Secondary | ICD-10-CM

## 2021-01-18 DIAGNOSIS — G250 Essential tremor: Secondary | ICD-10-CM

## 2021-01-19 ENCOUNTER — Encounter (HOSPITAL_COMMUNITY): Payer: Self-pay | Admitting: *Deleted

## 2021-01-19 ENCOUNTER — Inpatient Hospital Stay (HOSPITAL_COMMUNITY)
Admission: EM | Admit: 2021-01-19 | Discharge: 2021-01-24 | DRG: 690 | Disposition: A | Discharge status: Home Health | Payer: Medicare Other | Attending: Family Medicine | Admitting: Family Medicine

## 2021-01-19 DIAGNOSIS — L89616 Pressure-induced deep tissue damage of right heel: Secondary | ICD-10-CM | POA: Diagnosis present

## 2021-01-19 DIAGNOSIS — Z8673 Personal history of transient ischemic attack (TIA), and cerebral infarction without residual deficits: Secondary | ICD-10-CM

## 2021-01-19 DIAGNOSIS — G2119 Other drug induced secondary parkinsonism: Secondary | ICD-10-CM | POA: Diagnosis present

## 2021-01-19 DIAGNOSIS — I5030 Unspecified diastolic (congestive) heart failure: Secondary | ICD-10-CM | POA: Diagnosis not present

## 2021-01-19 DIAGNOSIS — Z79899 Other long term (current) drug therapy: Secondary | ICD-10-CM

## 2021-01-19 DIAGNOSIS — R531 Weakness: Secondary | ICD-10-CM | POA: Diagnosis not present

## 2021-01-19 DIAGNOSIS — F32A Depression, unspecified: Secondary | ICD-10-CM | POA: Diagnosis not present

## 2021-01-19 DIAGNOSIS — M419 Scoliosis, unspecified: Secondary | ICD-10-CM | POA: Diagnosis not present

## 2021-01-19 DIAGNOSIS — M1990 Unspecified osteoarthritis, unspecified site: Secondary | ICD-10-CM | POA: Diagnosis not present

## 2021-01-19 DIAGNOSIS — M6259 Muscle wasting and atrophy, not elsewhere classified, multiple sites: Secondary | ICD-10-CM | POA: Diagnosis not present

## 2021-01-19 DIAGNOSIS — Z8249 Family history of ischemic heart disease and other diseases of the circulatory system: Secondary | ICD-10-CM

## 2021-01-19 DIAGNOSIS — Z87891 Personal history of nicotine dependence: Secondary | ICD-10-CM | POA: Diagnosis not present

## 2021-01-19 DIAGNOSIS — Z825 Family history of asthma and other chronic lower respiratory diseases: Secondary | ICD-10-CM

## 2021-01-19 DIAGNOSIS — E559 Vitamin D deficiency, unspecified: Secondary | ICD-10-CM | POA: Diagnosis present

## 2021-01-19 DIAGNOSIS — I2510 Atherosclerotic heart disease of native coronary artery without angina pectoris: Secondary | ICD-10-CM | POA: Diagnosis not present

## 2021-01-19 DIAGNOSIS — I10 Essential (primary) hypertension: Secondary | ICD-10-CM | POA: Diagnosis not present

## 2021-01-19 DIAGNOSIS — Z20822 Contact with and (suspected) exposure to covid-19: Secondary | ICD-10-CM | POA: Diagnosis not present

## 2021-01-19 DIAGNOSIS — Z7902 Long term (current) use of antithrombotics/antiplatelets: Secondary | ICD-10-CM | POA: Diagnosis not present

## 2021-01-19 DIAGNOSIS — M6281 Muscle weakness (generalized): Secondary | ICD-10-CM | POA: Diagnosis not present

## 2021-01-19 DIAGNOSIS — B9620 Unspecified Escherichia coli [E. coli] as the cause of diseases classified elsewhere: Secondary | ICD-10-CM | POA: Diagnosis present

## 2021-01-19 DIAGNOSIS — J438 Other emphysema: Secondary | ICD-10-CM | POA: Diagnosis not present

## 2021-01-19 DIAGNOSIS — Z952 Presence of prosthetic heart valve: Secondary | ICD-10-CM

## 2021-01-19 DIAGNOSIS — G20 Parkinson's disease: Secondary | ICD-10-CM | POA: Diagnosis not present

## 2021-01-19 DIAGNOSIS — I110 Hypertensive heart disease with heart failure: Secondary | ICD-10-CM | POA: Diagnosis present

## 2021-01-19 DIAGNOSIS — I5032 Chronic diastolic (congestive) heart failure: Secondary | ICD-10-CM | POA: Diagnosis present

## 2021-01-19 DIAGNOSIS — R0902 Hypoxemia: Secondary | ICD-10-CM | POA: Diagnosis not present

## 2021-01-19 DIAGNOSIS — D509 Iron deficiency anemia, unspecified: Secondary | ICD-10-CM | POA: Diagnosis not present

## 2021-01-19 DIAGNOSIS — M4802 Spinal stenosis, cervical region: Secondary | ICD-10-CM | POA: Diagnosis not present

## 2021-01-19 DIAGNOSIS — D692 Other nonthrombocytopenic purpura: Secondary | ICD-10-CM | POA: Diagnosis not present

## 2021-01-19 DIAGNOSIS — L8990 Pressure ulcer of unspecified site, unspecified stage: Secondary | ICD-10-CM | POA: Insufficient documentation

## 2021-01-19 DIAGNOSIS — E538 Deficiency of other specified B group vitamins: Secondary | ICD-10-CM | POA: Diagnosis not present

## 2021-01-19 DIAGNOSIS — N390 Urinary tract infection, site not specified: Principal | ICD-10-CM | POA: Diagnosis present

## 2021-01-19 DIAGNOSIS — Z9181 History of falling: Secondary | ICD-10-CM | POA: Diagnosis not present

## 2021-01-19 DIAGNOSIS — R410 Disorientation, unspecified: Secondary | ICD-10-CM | POA: Diagnosis not present

## 2021-01-19 DIAGNOSIS — N3000 Acute cystitis without hematuria: Secondary | ICD-10-CM

## 2021-01-19 DIAGNOSIS — N400 Enlarged prostate without lower urinary tract symptoms: Secondary | ICD-10-CM | POA: Diagnosis present

## 2021-01-19 DIAGNOSIS — Z95828 Presence of other vascular implants and grafts: Secondary | ICD-10-CM | POA: Diagnosis not present

## 2021-01-19 DIAGNOSIS — R2681 Unsteadiness on feet: Secondary | ICD-10-CM | POA: Diagnosis not present

## 2021-01-19 DIAGNOSIS — Z974 Presence of external hearing-aid: Secondary | ICD-10-CM | POA: Diagnosis not present

## 2021-01-19 DIAGNOSIS — Z7982 Long term (current) use of aspirin: Secondary | ICD-10-CM

## 2021-01-19 DIAGNOSIS — I509 Heart failure, unspecified: Secondary | ICD-10-CM | POA: Diagnosis not present

## 2021-01-19 DIAGNOSIS — L89621 Pressure ulcer of left heel, stage 1: Secondary | ICD-10-CM | POA: Diagnosis present

## 2021-01-19 DIAGNOSIS — Z820 Family history of epilepsy and other diseases of the nervous system: Secondary | ICD-10-CM

## 2021-01-19 DIAGNOSIS — M19019 Primary osteoarthritis, unspecified shoulder: Secondary | ICD-10-CM | POA: Diagnosis present

## 2021-01-19 DIAGNOSIS — L89612 Pressure ulcer of right heel, stage 2: Secondary | ICD-10-CM | POA: Diagnosis not present

## 2021-01-19 DIAGNOSIS — L89152 Pressure ulcer of sacral region, stage 2: Secondary | ICD-10-CM | POA: Diagnosis present

## 2021-01-19 DIAGNOSIS — M4805 Spinal stenosis, thoracolumbar region: Secondary | ICD-10-CM | POA: Diagnosis not present

## 2021-01-19 DIAGNOSIS — G5620 Lesion of ulnar nerve, unspecified upper limb: Secondary | ICD-10-CM | POA: Diagnosis present

## 2021-01-19 DIAGNOSIS — G959 Disease of spinal cord, unspecified: Secondary | ICD-10-CM | POA: Diagnosis not present

## 2021-01-19 DIAGNOSIS — E785 Hyperlipidemia, unspecified: Secondary | ICD-10-CM | POA: Diagnosis present

## 2021-01-19 DIAGNOSIS — M170 Bilateral primary osteoarthritis of knee: Secondary | ICD-10-CM | POA: Diagnosis present

## 2021-01-19 DIAGNOSIS — Z743 Need for continuous supervision: Secondary | ICD-10-CM | POA: Diagnosis not present

## 2021-01-19 DIAGNOSIS — R509 Fever, unspecified: Secondary | ICD-10-CM | POA: Diagnosis not present

## 2021-01-19 DIAGNOSIS — Z83438 Family history of other disorder of lipoprotein metabolism and other lipidemia: Secondary | ICD-10-CM

## 2021-01-19 DIAGNOSIS — Z791 Long term (current) use of non-steroidal anti-inflammatories (NSAID): Secondary | ICD-10-CM

## 2021-01-19 DIAGNOSIS — F419 Anxiety disorder, unspecified: Secondary | ICD-10-CM | POA: Diagnosis present

## 2021-01-19 DIAGNOSIS — I639 Cerebral infarction, unspecified: Secondary | ICD-10-CM | POA: Diagnosis not present

## 2021-01-19 DIAGNOSIS — M479 Spondylosis, unspecified: Secondary | ICD-10-CM | POA: Diagnosis present

## 2021-01-19 DIAGNOSIS — F331 Major depressive disorder, recurrent, moderate: Secondary | ICD-10-CM | POA: Diagnosis present

## 2021-01-19 DIAGNOSIS — R1311 Dysphagia, oral phase: Secondary | ICD-10-CM | POA: Diagnosis not present

## 2021-01-19 DIAGNOSIS — J449 Chronic obstructive pulmonary disease, unspecified: Secondary | ICD-10-CM | POA: Diagnosis not present

## 2021-01-19 LAB — LIPASE, BLOOD: Lipase: 28 U/L (ref 11–51)

## 2021-01-19 LAB — CBC WITH DIFFERENTIAL/PLATELET
Abs Immature Granulocytes: 0.12 10*3/uL — ABNORMAL HIGH (ref 0.00–0.07)
Basophils Absolute: 0 10*3/uL (ref 0.0–0.1)
Basophils Relative: 0 %
Eosinophils Absolute: 0 10*3/uL (ref 0.0–0.5)
Eosinophils Relative: 0 %
HCT: 37.1 % — ABNORMAL LOW (ref 39.0–52.0)
Hemoglobin: 12.5 g/dL — ABNORMAL LOW (ref 13.0–17.0)
Immature Granulocytes: 1 %
Lymphocytes Relative: 7 %
Lymphs Abs: 0.7 10*3/uL (ref 0.7–4.0)
MCH: 34.5 pg — ABNORMAL HIGH (ref 26.0–34.0)
MCHC: 33.7 g/dL (ref 30.0–36.0)
MCV: 102.5 fL — ABNORMAL HIGH (ref 80.0–100.0)
Monocytes Absolute: 0.5 10*3/uL (ref 0.1–1.0)
Monocytes Relative: 5 %
Neutro Abs: 8.7 10*3/uL — ABNORMAL HIGH (ref 1.7–7.7)
Neutrophils Relative %: 87 %
Platelets: 179 10*3/uL (ref 150–400)
RBC: 3.62 MIL/uL — ABNORMAL LOW (ref 4.22–5.81)
RDW: 12.2 % (ref 11.5–15.5)
WBC: 10.1 10*3/uL (ref 4.0–10.5)
nRBC: 0 % (ref 0.0–0.2)

## 2021-01-19 LAB — URINALYSIS, ROUTINE W REFLEX MICROSCOPIC
Bilirubin Urine: NEGATIVE
Glucose, UA: NEGATIVE mg/dL
Ketones, ur: 20 mg/dL — AB
Nitrite: POSITIVE — AB
Protein, ur: 100 mg/dL — AB
Specific Gravity, Urine: 1.028 (ref 1.005–1.030)
pH: 5 (ref 5.0–8.0)

## 2021-01-19 LAB — COMPREHENSIVE METABOLIC PANEL
ALT: 10 U/L (ref 0–44)
AST: 15 U/L (ref 15–41)
Albumin: 3.1 g/dL — ABNORMAL LOW (ref 3.5–5.0)
Alkaline Phosphatase: 60 U/L (ref 38–126)
Anion gap: 9 (ref 5–15)
BUN: 13 mg/dL (ref 8–23)
CO2: 24 mmol/L (ref 22–32)
Calcium: 9 mg/dL (ref 8.9–10.3)
Chloride: 101 mmol/L (ref 98–111)
Creatinine, Ser: 0.79 mg/dL (ref 0.61–1.24)
GFR, Estimated: >60 mL/min (ref >60)
Glucose, Bld: 122 mg/dL — ABNORMAL HIGH (ref 70–99)
Potassium: 4 mmol/L (ref 3.5–5.1)
Sodium: 134 mmol/L — ABNORMAL LOW (ref 135–145)
Total Bilirubin: 1.3 mg/dL — ABNORMAL HIGH (ref 0.3–1.2)
Total Protein: 6.4 g/dL — ABNORMAL LOW (ref 6.5–8.1)

## 2021-01-19 LAB — LACTIC ACID, PLASMA: Lactic Acid, Venous: 0.9 mmol/L (ref 0.5–1.9)

## 2021-01-19 MED ORDER — SODIUM CHLORIDE 0.9 % IV SOLN
1.0000 g | Freq: Once | INTRAVENOUS | Status: AC
  Administered 2021-01-19: 1 g via INTRAVENOUS
  Filled 2021-01-19: qty 10

## 2021-01-19 NOTE — ED Triage Notes (Signed)
Pt here from home via [facility] for dysuria and constipation x 4 days. Denies nvd.  [facility] stated temp of 100.5.

## 2021-01-19 NOTE — ED Provider Notes (Signed)
[facility] EMERGENCY DEPARTMENT Provider Note   CSN: [id number] Arrival date & time: 01/19/21  1241     History Chief Complaint  Patient presents with  . Dysuria    Travis Reese is a 75 y.o. male.  HPI Patient presents with generalized weakness.  Temperatures up to 100.5 at home.  Patient's wife states the temperature normally runs lower.  Patient has had dysuria.  Had for the last few days.  Recently been seen by neurology for likely Parkinson's although because of it is unknown.  No cough.  More weak than his baseline.  Unable to get out of bed were normally he be able assist with this.  Nonfocal.  Mild suprapubic pain.  Patient wears a pull-up due to urinary incontinence at baseline    Past Medical History:  Diagnosis Date  . Allergic rhinosinusitis   . Anxiety   . Aortic valve sclerosis   . Arthritis    knees, neck, shoulder  . CHF (congestive heart failure) ([place])   . Cubital tunnel syndrome   . DDD (degenerative disc disease), cervical   . Depression   . Elevated PSA    denies per patient  . HTN (hypertension)   . Hyperlipidemia   . Hyperplasia of prostate with lower urinary tract symptoms (LUTS)   . Hypertension   . Mild dilation of ascending aorta (H[facility])   . 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 Rectal prolapse   . Scoliosis of lumbosacral spine   . Tremor   . Vitamin D deficiency   . Wears hearing aid    bilateral    Patient Active Problem List   Diagnosis Date Noted  . 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 Tremor   . Weakness 12/15/2020  . S/P aortic valve replacement with bioprosthetic valve 05/01/2018  . HTN (hypertension) 04/19/2018  . Acute CHF (congestive heart failure) ([place]) 04/19/2018  . BPH (benign prostatic hyperplasia) 04/19/2018  . Anxiety 04/19/2018  . Cellulitis of face 10/15/2016  . Mixed hyperlipidemia 09/23/2016  . Other fatigue 07/23/2016  . Recurrent sinusitis 07/23/2016  . Other  emphysema ([place]) 07/11/2016  . Restless leg syndrome 07/10/2016  . Plantar wart of left foot 01/05/2016  . Prolapsed internal hemorrhoids, grade 3 04/19/2015    Past Surgical History:  Procedure Laterality Date  . 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 BENTALL PROCEDURE N/A 05/01/2018   Procedure: BIOLOGICAL BENTALL PROCEDURE AORTIC ROOT REPLACEMENT WITH 30MM VALSALVA GRAFT & 27MM INSPIRIS VALVE.;  Surgeon: Rexene Alberts, MD;  Location: [facility];  Service: Open Heart Surgery;  Laterality: N/A;  . CATARACT EXTRACTION  04/2018  . COLONOSCOPY  2007  . NASAL SINUS SURGERY  2006  . RIGHT/LEFT HEART CATH AND CORONARY ANGIOGRAPHY N/A 04/27/2018   Procedure: RIGHT/LEFT HEART CATH AND CORONARY ANGIOGRAPHY;  Surgeon: Jolaine Artist, MD;  Location: [facility];  Service: Cardiovascular;  Laterality: N/A;  . TEE WITHOUT CARDIOVERSION N/A 04/27/2018   Procedure: TRANSESOPHAGEAL ECHOCARDIOGRAM (TEE);  Surgeon: Jolaine Artist, MD;  Location: [facility] ENDOSCOPY;  Service: Cardiovascular;  Laterality: N/A;  . TEE WITHOUT CARDIOVERSION N/A 05/01/2018   Procedure: TRANSESOPHAGEAL ECHOCARDIOGRAM (TEE);  Surgeon: Rexene Alberts, MD;  Location: [facility];  Service: Open Heart Surgery;  Laterality: N/A;  . TRANSANAL HEMORRHOIDAL DEARTERIALIZATION N/A 11/15/2015   Procedure: TRANSANAL HEMORRHOIDAL DEARTERIALIZATION;  Surgeon: Leighton Ruff, MD;  Location: [facility];  Service: General;  Laterality: N/A;       Family  History  Problem Relation Age of Onset  . COPD Mother   . Heart attack Mother   . Colon polyps Father   . Prostate cancer Father   . Parkinson's disease Father   . Hyperlipidemia Father   . Hypertension Sister   . Hypertension Brother   . Breast cancer Sister   . Prostate cancer Paternal Uncle     Social History   Tobacco Use  . Smoking status: Former Smoker    Packs/day: 0.50    Years: 50.00    Pack years: 25.00    Types: Cigarettes    Quit date: 05/2018     Years since quitting: 2.7  . Smokeless tobacco: Never Used  Vaping Use  . Vaping Use: Former  Substance Use Topics  . Alcohol use: Yes    Alcohol/week: 0.0 standard drinks    Comment: Occasional  . Drug use: No    Home Medications Prior to Admission medications   Medication Sig Start Date End Date Taking? Authorizing Provider  acetaminophen (TYLENOL) 500 MG tablet Take 1,000 mg by mouth every 8 (eight) hours as needed for mild pain.    [provider]  albuterol (VENTOLIN HFA) 108 (90 Base) MCG/ACT inhaler Inhale 2 puffs into the lungs every 6 (six) hours as needed for wheezing or shortness of breath.    [provider]  aspirin EC 325 MG EC tablet Take 1 tablet (325 mg total) by mouth daily. 05/08/18   Barrett, Erin R, PA-C  carvedilol (COREG) 25 MG tablet TAKE 1 TABLET BY MOUTH  TWICE DAILY WITH MEALS Patient taking differently: Take 25 mg by mouth 2 (two) times daily with a meal. 05/03/20   Bensimhon, Shaune Pascal, MD  celecoxib (CELEBREX) 100 MG capsule Take 100 mg by mouth every morning.    [provider]  clonazePAM (KLONOPIN) 1 MG tablet Take 1 tablet (1 mg total) by mouth at bedtime. 12/20/20   Arrien, Jimmy Picket, MD  fluticasone (FLONASE) 50 MCG/ACT nasal spray Place 1 spray into both nostrils at bedtime. 12/07/20   [provider]  folic acid (FOLVITE) 1 MG tablet Take 1 tablet (1 mg total) by mouth daily. 12/20/20 01/19/21  Arrien, Jimmy Picket, MD  Krill Oil 1000 MG CAPS Take 1,000 mg by mouth at bedtime.    [provider]  Melatonin 10 MG CAPS Take 10 mg by mouth at bedtime.    [provider]  montelukast (SINGULAIR) 10 MG tablet Take 10 mg by mouth at bedtime. 05/28/17   [provider]  primidone (MYSOLINE) 50 MG tablet Take 1 tablet (50 mg total) by mouth at bedtime. 05/18/18   Tat, Eustace Quail, DO  spironolactone (ALDACTONE) 25 MG tablet Take 0.5 tablets (12.5 mg total) by mouth daily. NEEDS AN APPOINTMENT FOR  FURTHER REFILLS Patient taking differently: Take 12.5 mg by mouth daily. 11/28/20   Park Liter, MD  tadalafil (CIALIS) 5 MG tablet Take 5 mg by mouth every morning.    [provider]  tamsulosin (FLOMAX) 0.4 MG CAPS capsule Take 0.4 mg by mouth 2 (two) times daily after a meal.    [provider]  Vitamin D, Ergocalciferol, (DRISDOL) 1.25 MG (50000 UT) CAPS capsule Take 50,000 Units by mouth every Monday.    [provider]  Zinc 50 MG TABS Take 50 mg by mouth at bedtime.    [provider]    Allergies    Patient has no known allergies.  Review of Systems  Review of Systems  Constitutional: Positive for appetite change, fatigue and fever.  HENT: Negative for congestion.   Respiratory: Negative for cough and shortness of breath.   Gastrointestinal: Positive for abdominal pain.  Genitourinary: Positive for dysuria.  Musculoskeletal: Negative for back pain.  Skin: Negative for rash.  Neurological: Positive for tremors and weakness.  Psychiatric/Behavioral: Negative for confusion.    Physical Exam Updated Vital Signs BP (!) 150/77 (BP Location: Right Arm)   Pulse 65   Temp 99.5 F (37.5 C) (Oral)   Resp 16   Ht 6\' 2"  (1.88 m)   Wt 84.8 kg   SpO2 100%   BMI 24.01 kg/m   Physical Exam Vitals reviewed.  HENT:     Head: Normocephalic.  Eyes:     Pupils: Pupils are equal, round, and reactive to light.  Pulmonary:     Breath sounds: No wheezing, rhonchi or rales.  Abdominal:     Comments: Mild suprapubic tenderness.  No rebound or guarding.  No hernias palpated.  Genitourinary:    Comments: No CVA tenderness Musculoskeletal:        General: No tenderness.     Cervical back: Neck supple.  Skin:    General: Skin is warm.  Neurological:     Mental Status: He is alert and oriented to person, place, and time.     Comments: Has tremor.  Chronic weakness.     ED Results / Procedures / Treatments   Labs (all labs ordered are  listed, but only abnormal results are displayed) Labs Reviewed  CBC WITH DIFFERENTIAL/PLATELET - Abnormal; Notable for the following components:      Result Value   RBC 3.62 (*)    Hemoglobin 12.5 (*)    HCT 37.1 (*)    MCV 102.5 (*)    MCH 34.5 (*)    Neutro Abs 8.7 (*)    Abs Immature Granulocytes 0.12 (*)    All other components within normal limits  COMPREHENSIVE METABOLIC PANEL - Abnormal; Notable for the following components:   Sodium 134 (*)    Glucose, Bld 122 (*)    Total Protein 6.4 (*)    Albumin 3.1 (*)    Total Bilirubin 1.3 (*)    All other components within normal limits  URINALYSIS, ROUTINE W REFLEX MICROSCOPIC - Abnormal; Notable for the following components:   Color, Urine AMBER (*)    APPearance HAZY (*)    Hgb urine dipstick MODERATE (*)    Ketones, ur 20 (*)    Protein, ur 100 (*)    Nitrite POSITIVE (*)    Leukocytes,Ua SMALL (*)    Bacteria, UA MANY (*)    All other components within normal limits  URINE CULTURE  LIPASE, BLOOD  LACTIC ACID, PLASMA  LACTIC ACID, PLASMA    EKG None  Radiology No results found.  Procedures Procedures   Medications Ordered in ED Medications  cefTRIAXone (ROCEPHIN) 1 g in sodium chloride 0.9 % 100 mL IVPB (has no administration in time range)    ED Course  I have reviewed the triage vital signs and the nursing notes.  Pertinent labs & imaging results that were available during my care of the patient were reviewed by me and considered in my medical decision making (see chart for details).    MDM Rules/Calculators/A&P                          Patient with weakness and fever.  Has Parkinson's but weakness has increased to the point where he was not able to get out of bed even with much assistance.  Reportedly temperatures elevated at home.  Afebrile here normal lactic acid, however has an apparent UTI.  With comorbidities and generalized weakness I feels the patient benefit from mission to the hospital.  Urine  culture sent.  Will discuss with unassigned medicine Final Clinical Impression(s) / ED Diagnoses Final diagnoses:  Acute cystitis without hematuria    Rx / DC Orders ED Discharge Orders    None       Davonna Belling, MD 01/19/21 2141

## 2021-01-19 NOTE — ED Provider Notes (Signed)
Emergency Medicine Provider Triage Evaluation Note  Travis Reese , a 75 y.o. male  was evaluated in triage.  Pt complains of fever 100.5, CBG 136 with EMS. Dysuria x 4 days. Not given meds by EMS, from home  Review of Systems  Positive: Fever, dysuria Negative: Abdominal pain  Physical Exam  There were no vitals taken for this visit. Gen:   Awake, no distress   Resp:  Normal effort  MSK:   Moves extremities without difficulty  Other:  Mild tenderness to suprapubic area  Medical Decision Making  Medically screening exam initiated at 12:38 PM.  Appropriate orders placed.  Alease Medina Rosemeyer was informed that the remainder of the evaluation will be completed by another provider, this initial triage assessment does not replace that evaluation, and the importance of remaining in the ED until their evaluation is complete.     Tacy Learn, PA-C 01/19/21 1240    Elnora Morrison, MD 01/19/21 [phone]

## 2021-01-20 ENCOUNTER — Encounter (HOSPITAL_COMMUNITY): Payer: Self-pay | Admitting: Family Medicine

## 2021-01-20 ENCOUNTER — Other Ambulatory Visit: Payer: Self-pay

## 2021-01-20 DIAGNOSIS — B9620 Unspecified Escherichia coli [E. coli] as the cause of diseases classified elsewhere: Secondary | ICD-10-CM | POA: Diagnosis present

## 2021-01-20 DIAGNOSIS — R531 Weakness: Secondary | ICD-10-CM | POA: Diagnosis present

## 2021-01-20 DIAGNOSIS — E538 Deficiency of other specified B group vitamins: Secondary | ICD-10-CM | POA: Diagnosis not present

## 2021-01-20 DIAGNOSIS — E785 Hyperlipidemia, unspecified: Secondary | ICD-10-CM | POA: Diagnosis present

## 2021-01-20 DIAGNOSIS — Z8673 Personal history of transient ischemic attack (TIA), and cerebral infarction without residual deficits: Secondary | ICD-10-CM

## 2021-01-20 DIAGNOSIS — Z974 Presence of external hearing-aid: Secondary | ICD-10-CM | POA: Diagnosis not present

## 2021-01-20 DIAGNOSIS — L89152 Pressure ulcer of sacral region, stage 2: Secondary | ICD-10-CM | POA: Diagnosis present

## 2021-01-20 DIAGNOSIS — M419 Scoliosis, unspecified: Secondary | ICD-10-CM | POA: Diagnosis present

## 2021-01-20 DIAGNOSIS — M6259 Muscle wasting and atrophy, not elsewhere classified, multiple sites: Secondary | ICD-10-CM | POA: Diagnosis not present

## 2021-01-20 DIAGNOSIS — Z9181 History of falling: Secondary | ICD-10-CM | POA: Diagnosis not present

## 2021-01-20 DIAGNOSIS — Z825 Family history of asthma and other chronic lower respiratory diseases: Secondary | ICD-10-CM | POA: Diagnosis not present

## 2021-01-20 DIAGNOSIS — F32A Depression, unspecified: Secondary | ICD-10-CM | POA: Diagnosis present

## 2021-01-20 DIAGNOSIS — Z820 Family history of epilepsy and other diseases of the nervous system: Secondary | ICD-10-CM | POA: Diagnosis not present

## 2021-01-20 DIAGNOSIS — G2119 Other drug induced secondary parkinsonism: Secondary | ICD-10-CM | POA: Diagnosis not present

## 2021-01-20 DIAGNOSIS — I5032 Chronic diastolic (congestive) heart failure: Secondary | ICD-10-CM

## 2021-01-20 DIAGNOSIS — N390 Urinary tract infection, site not specified: Secondary | ICD-10-CM | POA: Diagnosis not present

## 2021-01-20 DIAGNOSIS — D509 Iron deficiency anemia, unspecified: Secondary | ICD-10-CM | POA: Diagnosis present

## 2021-01-20 DIAGNOSIS — Z20822 Contact with and (suspected) exposure to covid-19: Secondary | ICD-10-CM | POA: Diagnosis present

## 2021-01-20 DIAGNOSIS — L8990 Pressure ulcer of unspecified site, unspecified stage: Secondary | ICD-10-CM | POA: Insufficient documentation

## 2021-01-20 DIAGNOSIS — I10 Essential (primary) hypertension: Secondary | ICD-10-CM | POA: Diagnosis not present

## 2021-01-20 DIAGNOSIS — N400 Enlarged prostate without lower urinary tract symptoms: Secondary | ICD-10-CM | POA: Diagnosis present

## 2021-01-20 DIAGNOSIS — L89621 Pressure ulcer of left heel, stage 1: Secondary | ICD-10-CM | POA: Diagnosis present

## 2021-01-20 DIAGNOSIS — N3000 Acute cystitis without hematuria: Secondary | ICD-10-CM

## 2021-01-20 DIAGNOSIS — I509 Heart failure, unspecified: Secondary | ICD-10-CM | POA: Diagnosis not present

## 2021-01-20 DIAGNOSIS — Z8249 Family history of ischemic heart disease and other diseases of the circulatory system: Secondary | ICD-10-CM | POA: Diagnosis not present

## 2021-01-20 DIAGNOSIS — Z95828 Presence of other vascular implants and grafts: Secondary | ICD-10-CM | POA: Diagnosis not present

## 2021-01-20 DIAGNOSIS — F419 Anxiety disorder, unspecified: Secondary | ICD-10-CM | POA: Diagnosis present

## 2021-01-20 DIAGNOSIS — M6281 Muscle weakness (generalized): Secondary | ICD-10-CM | POA: Diagnosis not present

## 2021-01-20 DIAGNOSIS — Z87891 Personal history of nicotine dependence: Secondary | ICD-10-CM | POA: Diagnosis not present

## 2021-01-20 DIAGNOSIS — I639 Cerebral infarction, unspecified: Secondary | ICD-10-CM | POA: Diagnosis not present

## 2021-01-20 DIAGNOSIS — I2510 Atherosclerotic heart disease of native coronary artery without angina pectoris: Secondary | ICD-10-CM | POA: Diagnosis present

## 2021-01-20 DIAGNOSIS — I110 Hypertensive heart disease with heart failure: Secondary | ICD-10-CM | POA: Diagnosis present

## 2021-01-20 DIAGNOSIS — R410 Disorientation, unspecified: Secondary | ICD-10-CM | POA: Diagnosis not present

## 2021-01-20 DIAGNOSIS — J449 Chronic obstructive pulmonary disease, unspecified: Secondary | ICD-10-CM | POA: Diagnosis not present

## 2021-01-20 DIAGNOSIS — M1990 Unspecified osteoarthritis, unspecified site: Secondary | ICD-10-CM | POA: Diagnosis not present

## 2021-01-20 DIAGNOSIS — R1311 Dysphagia, oral phase: Secondary | ICD-10-CM | POA: Diagnosis not present

## 2021-01-20 DIAGNOSIS — J438 Other emphysema: Secondary | ICD-10-CM | POA: Diagnosis present

## 2021-01-20 LAB — COMPREHENSIVE METABOLIC PANEL
ALT: 10 U/L (ref 0–44)
AST: 15 U/L (ref 15–41)
Albumin: 2.9 g/dL — ABNORMAL LOW (ref 3.5–5.0)
Alkaline Phosphatase: 59 U/L (ref 38–126)
Anion gap: 10 (ref 5–15)
BUN: 15 mg/dL (ref 8–23)
CO2: 24 mmol/L (ref 22–32)
Calcium: 9 mg/dL (ref 8.9–10.3)
Chloride: 100 mmol/L (ref 98–111)
Creatinine, Ser: 0.75 mg/dL (ref 0.61–1.24)
GFR, Estimated: >60 mL/min (ref >60)
Glucose, Bld: 130 mg/dL — ABNORMAL HIGH (ref 70–99)
Potassium: 3.9 mmol/L (ref 3.5–5.1)
Sodium: 134 mmol/L — ABNORMAL LOW (ref 135–145)
Total Bilirubin: 1.3 mg/dL — ABNORMAL HIGH (ref 0.3–1.2)
Total Protein: 6.1 g/dL — ABNORMAL LOW (ref 6.5–8.1)

## 2021-01-20 LAB — CBC
HCT: 35.4 % — ABNORMAL LOW (ref 39.0–52.0)
Hemoglobin: 12.2 g/dL — ABNORMAL LOW (ref 13.0–17.0)
MCH: 34.5 pg — ABNORMAL HIGH (ref 26.0–34.0)
MCHC: 34.5 g/dL (ref 30.0–36.0)
MCV: 100 fL (ref 80.0–100.0)
Platelets: 163 10*3/uL (ref 150–400)
RBC: 3.54 MIL/uL — ABNORMAL LOW (ref 4.22–5.81)
RDW: 12.1 % (ref 11.5–15.5)
WBC: 6.1 10*3/uL (ref 4.0–10.5)
nRBC: 0 % (ref 0.0–0.2)

## 2021-01-20 LAB — SARS CORONAVIRUS 2 (TAT 6-24 HRS): SARS Coronavirus 2: NEGATIVE

## 2021-01-20 MED ORDER — SPIRONOLACTONE 12.5 MG HALF TABLET
12.5000 mg | ORAL_TABLET | Freq: Every day | ORAL | Status: DC
  Administered 2021-01-20 – 2021-01-24 (×5): 12.5 mg via ORAL
  Filled 2021-01-20 (×5): qty 1

## 2021-01-20 MED ORDER — CARVEDILOL 25 MG PO TABS
25.0000 mg | ORAL_TABLET | Freq: Two times a day (BID) | ORAL | Status: DC
  Administered 2021-01-21 – 2021-01-24 (×7): 25 mg via ORAL
  Filled 2021-01-20 (×9): qty 1

## 2021-01-20 MED ORDER — ACETAMINOPHEN 325 MG PO TABS
650.0000 mg | ORAL_TABLET | Freq: Four times a day (QID) | ORAL | Status: DC | PRN
Start: 2021-01-20 — End: 2021-01-24
  Administered 2021-01-20 (×2): 650 mg via ORAL
  Filled 2021-01-20 (×2): qty 2

## 2021-01-20 MED ORDER — DULOXETINE HCL 30 MG PO CPEP
30.0000 mg | ORAL_CAPSULE | Freq: Every day | ORAL | Status: DC
  Administered 2021-01-20 – 2021-01-24 (×5): 30 mg via ORAL
  Filled 2021-01-20 (×6): qty 1

## 2021-01-20 MED ORDER — CELECOXIB 100 MG PO CAPS
100.0000 mg | ORAL_CAPSULE | Freq: Every morning | ORAL | Status: DC
  Administered 2021-01-20 – 2021-01-24 (×5): 100 mg via ORAL
  Filled 2021-01-20 (×6): qty 1

## 2021-01-20 MED ORDER — CLONAZEPAM 1 MG PO TABS
1.0000 mg | ORAL_TABLET | Freq: Every day | ORAL | Status: DC
  Administered 2021-01-20 – 2021-01-23 (×5): 1 mg via ORAL
  Filled 2021-01-20 (×5): qty 1

## 2021-01-20 MED ORDER — CYANOCOBALAMIN 1000 MCG/ML IJ SOLN
1000.0000 ug | Freq: Every day | INTRAMUSCULAR | Status: AC
  Administered 2021-01-20 – 2021-01-22 (×3): 1000 ug via INTRAMUSCULAR
  Filled 2021-01-20 (×5): qty 1

## 2021-01-20 MED ORDER — FOLIC ACID 1 MG PO TABS
1.0000 mg | ORAL_TABLET | Freq: Every day | ORAL | Status: DC
  Administered 2021-01-20 – 2021-01-24 (×5): 1 mg via ORAL
  Filled 2021-01-20 (×5): qty 1

## 2021-01-20 MED ORDER — TAMSULOSIN HCL 0.4 MG PO CAPS
0.4000 mg | ORAL_CAPSULE | Freq: Two times a day (BID) | ORAL | Status: DC
  Administered 2021-01-20 – 2021-01-24 (×8): 0.4 mg via ORAL
  Filled 2021-01-20 (×10): qty 1

## 2021-01-20 MED ORDER — MONTELUKAST SODIUM 10 MG PO TABS
10.0000 mg | ORAL_TABLET | Freq: Every day | ORAL | Status: DC
  Administered 2021-01-20 – 2021-01-23 (×5): 10 mg via ORAL
  Filled 2021-01-20 (×6): qty 1

## 2021-01-20 MED ORDER — ENOXAPARIN SODIUM 40 MG/0.4ML IJ SOSY
40.0000 mg | PREFILLED_SYRINGE | Freq: Every day | INTRAMUSCULAR | Status: DC
  Administered 2021-01-20 – 2021-01-24 (×5): 40 mg via SUBCUTANEOUS
  Filled 2021-01-20 (×5): qty 0.4

## 2021-01-20 MED ORDER — PRIMIDONE 50 MG PO TABS
50.0000 mg | ORAL_TABLET | Freq: Every day | ORAL | Status: DC
  Administered 2021-01-20 – 2021-01-23 (×5): 50 mg via ORAL
  Filled 2021-01-20 (×6): qty 1

## 2021-01-20 MED ORDER — SENNOSIDES-DOCUSATE SODIUM 8.6-50 MG PO TABS: 1.0000 | ORAL_TABLET | Freq: Every evening | ORAL | Status: DC | PRN

## 2021-01-20 MED ORDER — SODIUM CHLORIDE 0.9 % IV SOLN
250.0000 mL | INTRAVENOUS | Status: DC | PRN
  Administered 2021-01-20: 250 mL via INTRAVENOUS

## 2021-01-20 MED ORDER — ACETAMINOPHEN 650 MG RE SUPP: 650.0000 mg | Freq: Four times a day (QID) | RECTAL | Status: DC | PRN

## 2021-01-20 MED ORDER — MELATONIN 5 MG PO TABS
10.0000 mg | ORAL_TABLET | Freq: Every day | ORAL | Status: DC
  Administered 2021-01-20 – 2021-01-23 (×5): 10 mg via ORAL
  Filled 2021-01-20 (×6): qty 2

## 2021-01-20 MED ORDER — SODIUM CHLORIDE 0.9 % IV SOLN
1.0000 g | INTRAVENOUS | Status: DC
  Administered 2021-01-20 – 2021-01-21 (×2): 1 g via INTRAVENOUS
  Filled 2021-01-20 (×2): qty 10
  Filled 2021-01-20: qty 1

## 2021-01-20 MED ORDER — ENSURE ENLIVE PO LIQD
237.0000 mL | Freq: Two times a day (BID) | ORAL | Status: DC
  Administered 2021-01-20 – 2021-01-21 (×4): 237 mL via ORAL
  Filled 2021-01-20 (×2): qty 237

## 2021-01-20 MED ORDER — ONDANSETRON HCL 4 MG/2ML IJ SOLN: 4.0000 mg | Freq: Four times a day (QID) | INTRAMUSCULAR | Status: DC | PRN

## 2021-01-20 MED ORDER — ASPIRIN EC 325 MG PO TBEC
325.0000 mg | DELAYED_RELEASE_TABLET | Freq: Every day | ORAL | Status: DC
  Administered 2021-01-20 – 2021-01-24 (×5): 325 mg via ORAL
  Filled 2021-01-20 (×5): qty 1

## 2021-01-20 MED ORDER — BISACODYL 5 MG PO TBEC: 5.0000 mg | DELAYED_RELEASE_TABLET | Freq: Every day | ORAL | Status: DC | PRN

## 2021-01-20 MED ORDER — CYANOCOBALAMIN 1000 MCG/ML IJ SOLN
1000.0000 ug | INTRAMUSCULAR | Status: DC
  Administered 2021-01-23: 1000 ug via INTRAMUSCULAR
  Filled 2021-01-20: qty 1

## 2021-01-20 MED ORDER — ONDANSETRON HCL 4 MG PO TABS: 4.0000 mg | ORAL_TABLET | Freq: Four times a day (QID) | ORAL | Status: DC | PRN

## 2021-01-20 MED ORDER — ALBUTEROL SULFATE HFA 108 (90 BASE) MCG/ACT IN AERS: 2.0000 | INHALATION_SPRAY | Freq: Four times a day (QID) | RESPIRATORY_TRACT | Status: DC | PRN

## 2021-01-20 NOTE — Evaluation (Signed)
Physical Therapy Evaluation Patient Details Name: Travis Reese MRN: [id number] DOB: 12-05-1945 Today's Date: 01/20/2021   History of Present Illness  Pt adm 5/20 with weakness, dysuria, and fatigue. Pt found to have UTI. 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 Expect as infection improves his mobility will also improve. Was using lift chair at home and per notes requiring assist to get OOB. Pt reports he isn't back to baseline with mobility but feels he significant other and daughter can provide the needed assist at home.     Follow Up Recommendations Home health PT;Supervision for mobility/OOB    Equipment Recommendations  None recommended by PT    Recommendations for Other Services       Precautions / Restrictions Precautions Precautions: Fall      Mobility  Bed Mobility Overal bed mobility: Needs Assistance Bed Mobility: Supine to Sit     Supine to sit: Mod assist;HOB elevated     General bed mobility comments: Assist to initiate any movement. Assist to bring legs off of bed and elevate trunk into sitting    Transfers Overall transfer level: Needs assistance Equipment used: 4-wheeled walker Transfers: Sit to/from Stand Sit to Stand: Mod assist;From elevated surface         General transfer comment: Assist to bring hips up and for balance  Ambulation/Gait Ambulation/Gait assistance: Min assist Gait Distance (Feet): 70 Feet Assistive device: 4-wheeled walker Gait Pattern/deviations: Step-through pattern;Decreased step length - right;Decreased step length - left;Shuffle;Trunk flexed Gait velocity: decr Gait velocity interpretation: <1.31 ft/sec, indicative of household ambulator General Gait Details: Assist  for balance. Verbal cues to stand more erect and step closer to the rollator.  Stairs            Wheelchair Mobility    Modified Rankin (Stroke Patients Only)       Balance Overall balance assessment: Needs assistance Sitting-balance support: No upper extremity supported;Feet supported Sitting balance-Leahy Scale: Fair     Standing balance support: Bilateral upper extremity supported Standing balance-Leahy Scale: Poor Standing balance comment: rollator and min guard for static standing                             Pertinent Vitals/Pain Pain Assessment: No/denies pain    Home Living Family/patient expects to be discharged to:: Private residence Living Arrangements: Spouse/significant other Available Help at Discharge: Family;Available 24 hours/day Type of Home: House Home Access: Ramped entrance     Home Layout: One level Home Equipment: Walker - 4 wheels;Shower seat;Grab bars - toilet;Grab bars - tub/shower;Cane - quad Additional Comments: lift chair    Prior Function Level of Independence: Needs assistance   Gait / Transfers Assistance Needed: Pt reports he was amb with rollator mostly on his own. Notes report he requires assist to get out of bed           Hand Dominance        Extremity/Trunk Assessment   Upper Extremity Assessment Upper Extremity Assessment: Generalized weakness    Lower Extremity Assessment Lower Extremity Assessment: Generalized weakness       Communication   Communication: No difficulties  Cognition Arousal/Alertness: Awake/alert Behavior During Therapy: Flat affect Overall Cognitive Status: No family/caregiver present to determine baseline cognitive functioning Area of Impairment:  Problem solving;Following commands                       Following Commands: Follows one step commands consistently;Follows one step commands with increased time     Problem Solving: Decreased initiation;Slow  processing;Requires verbal cues;Requires tactile cues General Comments: Likely baseline due to Parkinsonism      General Comments      Exercises     Assessment/Plan    PT Assessment Patient needs continued PT services  PT Problem List Decreased strength;Decreased activity tolerance;Decreased balance;Decreased mobility;Decreased cognition       PT Treatment Interventions DME instruction;Gait training;Functional mobility training;Therapeutic activities;Therapeutic exercise;Balance training;Patient/family education    PT Goals (Current goals can be found in the Care Plan section)  Acute Rehab PT Goals Patient Stated Goal: not stated PT Goal Formulation: With patient Time For Goal Achievement: 02/03/21 Potential to Achieve Goals: Good    Frequency Min 3X/week   Barriers to discharge        Co-evaluation               AM-PAC PT "6 Clicks" Mobility  Outcome Measure Help needed turning from your back to your side while in a flat bed without using bedrails?: A Lot Help needed moving from lying on your back to sitting on the side of a flat bed without using bedrails?: A Lot Help needed moving to and from a bed to a chair (including a wheelchair)?: A Lot Help needed standing up from a chair using your arms (e.g., wheelchair or bedside chair)?: A Lot Help needed to walk in hospital room?: A Little Help needed climbing 3-5 steps with a railing? : A Lot 6 Click Score: 13    End of Session Equipment Utilized During Treatment: Gait belt Activity Tolerance: Patient limited by fatigue Patient left: in chair;with call bell/phone within reach;with chair alarm set Nurse Communication: Mobility status PT Visit Diagnosis: Other abnormalities of gait and mobility (R26.89);Muscle weakness (generalized) (M62.81);History of falling (Z91.81);Other symptoms and signs involving the nervous system (R29.898)    Time: 8469-6295 PT Time Calculation (min) (ACUTE ONLY): 36 min   Charges:    PT Evaluation $PT Eval Moderate Complexity: 1 Mod PT Treatments $Gait Training: 8-22 mins        [place] Pager [phone] Office [place] 01/20/2021, 10:22 AM

## 2021-01-20 NOTE — Plan of Care (Signed)

## 2021-01-20 NOTE — Progress Notes (Signed)
MD notified of patient's bps 99/68 & rechecked 109/72. Orders received to hold morning dose Coreg.

## 2021-01-20 NOTE — Plan of Care (Signed)

## 2021-01-20 NOTE — H&P (Signed)
History and Physical    ARVIN ABELLO DJM:[id number] DOB: November 17, 1945 DOA: 01/19/2021  PCP: Janine Limbo, PA-C   Patient coming from: Home   Chief Complaint: Increased fatigue and general weakness, dysuria, fever  HPI: Travis Reese is a 75 y.o. male with medical history significant for chronic diastolic CHF, mild nonobstructive CAD, depression, anxiety, drug-induced parkinson, and chronic diastolic CHF, now presenting to the emergency department for evaluation of dysuria, fever, fatigue, and general weakness.  Patient requires assistance with bathing and getting out of bed at baseline but has been feeling more fatigued and generally weak for approximately a week or more and has had dysuria.  His wife was unable to help him out of bed today and noted that he had been increasingly weak and fatigued over the past few days.  He reportedly had a fever with EMS.  He denies flank pain but has had subjective fevers and chills.  Denies any focal numbness or weakness.  ED Course: Upon arrival to the ED, patient is found to be afebrile saturating well on room air, and with stable blood pressure.  There was a mild microcytic anemia and normal lactic acid noted in the ED.  Urine was nitrite positive.  Urine culture was ordered and the patient was given a gram of Rocephin.  Review of Systems:  All other systems reviewed and apart from HPI, are negative.  Past Medical History:  Diagnosis Date  . Allergic rhinosinusitis   . Anxiety   . Aortic valve sclerosis   . Arthritis    knees, neck, shoulder  . CHF (congestive heart failure) ([place])   . Cubital tunnel syndrome   . DDD (degenerative disc disease), cervical   . Depression   . Elevated PSA    denies per patient  . HTN (hypertension)   . Hyperlipidemia   . Hyperplasia of prostate with lower urinary tract symptoms (LUTS)   . Hypertension   . Mild dilation of ascending aorta (HCC)   . 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 Rectal  prolapse   . Scoliosis of lumbosacral spine   . Tremor   . Vitamin D deficiency   . Wears hearing aid    bilateral    Past Surgical History:  Procedure Laterality Date  . 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 BENTALL PROCEDURE N/A 05/01/2018   Procedure: BIOLOGICAL BENTALL PROCEDURE AORTIC ROOT REPLACEMENT WITH 30MM VALSALVA GRAFT & 27MM INSPIRIS VALVE.;  Surgeon: Rexene Alberts, MD;  Location: [facility];  Service: Open Heart Surgery;  Laterality: N/A;  . CATARACT EXTRACTION  04/2018  . COLONOSCOPY  2007  . NASAL SINUS SURGERY  2006  . RIGHT/LEFT HEART CATH AND CORONARY ANGIOGRAPHY N/A 04/27/2018   Procedure: RIGHT/LEFT HEART CATH AND CORONARY ANGIOGRAPHY;  Surgeon: Jolaine Artist, MD;  Location: [facility];  Service: Cardiovascular;  Laterality: N/A;  . TEE WITHOUT CARDIOVERSION N/A 04/27/2018   Procedure: TRANSESOPHAGEAL ECHOCARDIOGRAM (TEE);  Surgeon: Jolaine Artist, MD;  Location: [facility] END[facility];  Service: Cardiovascular;  Laterality: N/A;  . TEE WITHOUT CARDIOVERSION N/A 05/01/2018   Procedure: TRANSESOPHAGEAL ECHOCARDIOGRAM (TEE);  Surgeon: Rexene Alberts, MD;  Location: [facility];  Service: Open Heart Surgery;  Laterality: N/A;  . TRANSANAL HEMORRHOIDAL DEARTERIALIZATION N/A 11/15/2015   Procedure: TRANSANAL HEMORRHOIDAL DEARTERIALIZATION;  Surgeon: Leighton Ruff, MD;  Location: [facility];  Service: General;  Laterality: N/A;    Social History:   reports that he quit smoking about 2 years ago. His  smoking use included cigarettes. He has a 25.00 pack-year smoking history. He has never used smokeless tobacco. He reports current alcohol use. He reports that he does not use drugs.  No Known Allergies  Family History  Problem Relation Age of Onset  . COPD Mother   . Heart attack Mother   . Colon polyps Father   . Prostate cancer Father   . Parkinson's disease Father   . Hyperlipidemia Father   . Hypertension Sister   . Hypertension Brother   .  Breast cancer Sister   . Prostate cancer Paternal Uncle      Prior to Admission medications   Medication Sig Start Date End Date Taking? Authorizing Provider  acetaminophen (TYLENOL) 500 MG tablet Take 1,000 mg by mouth every 8 (eight) hours as needed for mild pain.   Yes [provider]  albuterol (VENTOLIN HFA) 108 (90 Base) MCG/ACT inhaler Inhale 2 puffs into the lungs every 6 (six) hours as needed for wheezing or shortness of breath.   Yes [provider]  aspirin EC 325 MG EC tablet Take 1 tablet (325 mg total) by mouth daily. 05/08/18  Yes Barrett, Erin R, PA-C  carvedilol (COREG) 25 MG tablet TAKE 1 TABLET BY MOUTH  TWICE DAILY WITH MEALS Patient taking differently: Take 25 mg by mouth 2 (two) times daily with a meal. 05/03/20  Yes Bensimhon, Shaune Pascal, MD  celecoxib (CELEBREX) 100 MG capsule Take 100 mg by mouth every morning.   Yes [provider]  clonazePAM (KLONOPIN) 1 MG tablet Take 1 tablet (1 mg total) by mouth at bedtime. 12/20/20  Yes Arrien, Jimmy Picket, MD  DULoxetine (CYMBALTA) 30 MG capsule Take 30 mg by mouth daily. 01/15/21  Yes [provider]  fluticasone (FLONASE) 50 MCG/ACT nasal spray Place 1 spray into both nostrils at bedtime. 12/07/20  Yes [provider]  Javier Docker Oil 1000 MG CAPS Take 1,000 mg by mouth at bedtime.   Yes [provider]  Melatonin 10 MG CAPS Take 10 mg by mouth at bedtime.   Yes [provider]  montelukast (SINGULAIR) 10 MG tablet Take 10 mg by mouth at bedtime. 05/28/17  Yes [provider]  primidone (MYSOLINE) 50 MG tablet Take 1 tablet (50 mg total) by mouth at bedtime. 05/18/18  Yes Tat, Eustace Quail, DO  spironolactone (ALDACTONE) 25 MG tablet Take 0.5 tablets (12.5 mg total) by mouth daily. NEEDS AN APPOINTMENT FOR FURTHER REFILLS Patient taking differently: Take 12.5 mg by mouth daily. 11/28/20  Yes Park Liter, MD  tadalafil (CIALIS) 5 MG tablet Take 5 mg by mouth every  morning.   Yes [provider]  tamsulosin (FLOMAX) 0.4 MG CAPS capsule Take 0.4 mg by mouth 2 (two) times daily after a meal.   Yes [provider]  Vitamin D, Ergocalciferol, (DRISDOL) 1.25 MG (50000 UT) CAPS capsule Take 50,000 Units by mouth every Monday.   Yes [provider]  Zinc 50 MG TABS Take 50 mg by mouth at bedtime.   Yes [provider]    Physical Exam: Vitals:   01/19/21 1835 01/19/21 1838 01/19/21 2151 01/19/21 2152  BP: (!) 142/87 (!) 150/77 (!) 153/93   Pulse: 88 65  91  Resp: 15 16 20    Temp:      TempSrc:      SpO2: 97% 100%  97%  Weight:      Height:        Constitutional: NAD, calm  Eyes: PERTLA,  lids and conjunctivae normal ENMT: Mucous membranes are moist. Posterior pharynx clear of any exudate or lesions.   Neck: supple, no masses  Respiratory: clear to auscultation bilaterally, no wheezing, no crackles. No accessory muscle use.  Cardiovascular: S1 & S2 heard, regular rate and rhythm. No extremity edema.   Abdomen: No distension, no tenderness, soft. Bowel sounds active.  Musculoskeletal: no clubbing / cyanosis. No joint deformity upper and lower extremities.   Skin: no significant rashes, lesions, ulcers. Warm, dry, well-perfused. Neurologic: CN 2-12 grossly intact. Sensation intact. Moving all extremities.  Psychiatric: Alert, affected blunted and responses are slow but he is oriented to person, place, and situation. Pleasant and cooperative.    Labs and Imaging on Admission: I have personally reviewed following labs and imaging studies  CBC: Recent Labs  Lab 01/19/21 1242  WBC 10.1  NEUTROABS 8.7*  HGB 12.5*  HCT 37.1*  MCV 102.5*  PLT 086   Basic Metabolic Panel: Recent Labs  Lab 01/19/21 1242  NA 134*  K 4.0  CL 101  CO2 24  GLUCOSE 122*  BUN 13  CREATININE 0.79  CALCIUM 9.0   GFR: Estimated Creatinine Clearance: 97 mL/min (by C-G formula based on SCr of 0.79 mg/dL). Liver Function  Tests: Recent Labs  Lab 01/19/21 1242  AST 15  ALT 10  ALKPHOS 60  BILITOT 1.3*  PROT 6.4*  ALBUMIN 3.1*   Recent Labs  Lab 01/19/21 1242  LIPASE 28   No results for input(s): AMMONIA in the last 168 hours. Coagulation Profile: No results for input(s): INR, PROTIME in the last 168 hours. Cardiac Enzymes: No results for input(s): CKTOTAL, CKMB, CKMBINDEX, TROPONINI in the last 168 hours. BNP (last 3 results) No results for input(s): PROBNP in the last 8760 hours. HbA1C: No results for input(s): HGBA1C in the last 72 hours. CBG: No results for input(s): GLUCAP in the last 168 hours. Lipid Profile: No results for input(s): CHOL, HDL, LDLCALC, TRIG, CHOLHDL, LDLDIRECT in the last 72 hours. Thyroid Function Tests: No results for input(s): TSH, T4TOTAL, FREET4, T3FREE, THYROIDAB in the last 72 hours. Anemia Panel: No results for input(s): VITAMINB12, FOLATE, FERRITIN, TIBC, IRON, RETICCTPCT in the last 72 hours. Urine analysis:    Component Value Date/Time   COLORURINE AMBER (A) 01/19/2021 2043   APPEARANCEUR HAZY (A) 01/19/2021 2043   LABSPEC 1.028 01/19/2021 2043   PHURINE 5.0 01/19/2021 2043   GLUCOSEU NEGATIVE 01/19/2021 2043   HGBUR MODERATE (A) 01/19/2021 2043   BILIRUBINUR NEGATIVE 01/19/2021 2043   KETONESUR 20 (A) 01/19/2021 2043   PROTEINUR 100 (A) 01/19/2021 2043   NITRITE POSITIVE (A) 01/19/2021 2043   LEUKOCYTESUR SMALL (A) 01/19/2021 2043   Sepsis Labs: @LABRCNTIP (procalcitonin:4,lacticidven:4) )No results found for this or any previous visit (from the past 240 hour(s)).   Radiological Exams on Admission: No results found.   Assessment/Plan   1. UTI  - Presents with dysuria, fever, and fatigue  - He is not septic on admission  - Urine culture was ordered in ED and Rocephin started  - Continue Rocephin, follow culture and clinical response to treatment    2. History of CVA  - Continue ASA    3. Emphysema  - No cough or wheezing  - Continue  as-needed albuterol    4. Depression, anxiety  - Appears stable  - Continue Cymbalta and Klonopin   5. Chronic diastolic CHF  - Appears compensated  - Continue Aldactone and Coreg     DVT prophylaxis: Lovenox  Code Status: Discussed with patient. No intubation. Wants only brief attempt at CPR, defibrillation, and ACLS meds.  Level of Care: Level of care: Med-Surg Family Communication: Wife updated from ED  Disposition Plan:  Patient is from: home  Anticipated d/c is to: TBD  Anticipated d/c date is: Possibly as early as 01/20/21 Patient currently: Pending PT assessment, improvement with antibiotics Consults called: None  Admission status: Observation     Vianne Bulls, MD [facility]  01/20/2021, 12:11 AM

## 2021-01-20 NOTE — Plan of Care (Signed)
  Problem: Education: Goal: Knowledge of General Education information will improve Description: Including pain rating scale, medication(s)/side effects and non-pharmacologic comfort measures 01/20/2021 0212 by Duard Brady, RN Outcome: Progressing 01/20/2021 0154 by Duard Brady, RN Outcome: Progressing 01/20/2021 0154 by Duard Brady, RN Outcome: Progressing 01/20/2021 0153 by Duard Brady, RN Outcome: Progressing   Problem: Clinical Measurements: Goal: Ability to maintain clinical measurements within normal limits will improve 01/20/2021 0212 by Duard Brady, RN Outcome: Progressing 01/20/2021 0154 by Duard Brady, RN Outcome: Progressing 01/20/2021 0154 by Duard Brady, RN Outcome: Progressing 01/20/2021 0153 by Duard Brady, RN Outcome: Progressing Goal: Will remain free from infection 01/20/2021 0212 by Duard Brady, RN Outcome: Progressing 01/20/2021 0154 by Duard Brady, RN Outcome: Progressing 01/20/2021 0154 by Duard Brady, RN Outcome: Progressing 01/20/2021 0153 by Duard Brady, RN Outcome: Progressing Goal: Diagnostic test results will improve 01/20/2021 0212 by Duard Brady, RN Outcome: Progressing 01/20/2021 0154 by Duard Brady, RN Outcome: Progressing 01/20/2021 0154 by Duard Brady, RN Outcome: Progressing 01/20/2021 0153 by Duard Brady, RN Outcome: Progressing Goal: Respiratory complications will improve 01/20/2021 0212 by Duard Brady, RN Outcome: Progressing 01/20/2021 0154 by Duard Brady, RN Outcome: Progressing 01/20/2021 0154 by Duard Brady, RN Outcome: Progressing 01/20/2021 0153 by Duard Brady, RN Outcome: Progressing Goal: Cardiovascular complication will be avoided 01/20/2021 0212 by Duard Brady, RN Outcome: Progressing 01/20/2021 0154 by Duard Brady, RN Outcome: Progressing 01/20/2021 0154 by Duard Brady, RN Outcome: Progressing 01/20/2021 0153 by Duard Brady, RN Outcome: Progressing   Problem: Health Behavior/Discharge Planning: Goal: Ability  to manage health-related needs will improve 01/20/2021 0212 by Duard Brady, RN Outcome: Progressing 01/20/2021 0154 by Duard Brady, RN Outcome: Progressing 01/20/2021 0154 by Duard Brady, RN Outcome: Progressing 01/20/2021 0153 by Duard Brady, RN Outcome: Progressing   Problem: Education: Goal: Knowledge of General Education information will improve Description: Including pain rating scale, medication(s)/side effects and non-pharmacologic comfort measures 01/20/2021 0212 by Duard Brady, RN Outcome: Progressing 01/20/2021 0154 by Duard Brady, RN Outcome: Progressing 01/20/2021 0154 by Duard Brady, RN Outcome: Progressing 01/20/2021 0153 by Duard Brady, RN Outcome: Progressing

## 2021-01-20 NOTE — Progress Notes (Signed)
MD verbal orders received to holding evening dose Coreg for bp of 112/68.

## 2021-01-20 NOTE — Progress Notes (Signed)
PROGRESS NOTE    Travis Reese  FWY:[id number] DOB: 07/05/1946 DOA: 01/19/2021 PCP: Janine Limbo, PA-C    Chief Complaint  Patient presents with  . Dysuria    Brief Narrative:    Travis Reese is a 75 y.o. male with medical history significant for chronic diastolic CHF, mild nonobstructive CAD, depression, anxiety, drug-induced parkinson, and chronic diastolic CHF, now presenting to the emergency department for evaluation of dysuria, fever, fatigue, and general weakness.  Patient requires assistance with bathing and getting out of bed at baseline but has been feeling more fatigued and generally weak for approximately a week or more and has had dysuria.  His wife was unable to help him out of bed today and noted that he had been increasingly weak and fatigued over the past few days.  He reportedly had a fever with EMS.  He denies flank pain but has had subjective fevers and chills.  Denies any focal numbness or weakness.  ED Course: Upon arrival to the ED, patient is found to be afebrile saturating well on room air, and with stable blood pressure.  There was a mild microcytic anemia and normal lactic acid noted in the ED.  Urine was nitrite positive.  Urine culture was ordered and the patient was given a gram of Rocephin.  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[place])   Depression   History of CVA (cerebrovascular accident)   Pressure injury of skin   UTI  - Presents with dysuria, fever, and fatigue  - He is not septic on admission  - Urine culture was ordered in ED and Rocephin started  - Continue Rocephin, urine cultures pending  History of CVA  - Continue ASA   -Patient appears to be forgetful with very minimal confusion, previous work-up indicated very low B12 level at 107, it  will be repleted with IM supplements -Seen by his neurologist 5/19, with evidence of subacute CVA on MRI done 4/14, will proceed with carotid Dopplers and echo with bubble study  B12 deficiency -Starting on supplements   Emphysema  - No cough or wheezing  - Continue as-needed albuterol    Depression, anxiety  - Appears stable  - Continue Cymbalta and Klonopin   Chronic diastolic CHF  - Appears compensated  - Continue Aldactone and Coreg    Generalized weakness/deconditioning - PT consulted   DVT prophylaxis: Lovenox Code Status: Partial Family Communication: D/W daughter by phone Disposition:   Status is: Inpatient  Remains inpatient appropriate because:IV treatments appropriate due to intensity of illness or inability to take PO   Dispo: The patient is from: Home              Anticipated d/c is to: Home              Patient currently is not medically stable to d/c.   Difficult to place patient No       Consultants:  None  Subjective:  For generalized weakness, fatigue and poor appetite Objective: Vitals:   01/20/21 0113 01/20/21 0533 01/20/21 1048 01/20/21 1100  BP: (!) 146/85 (!) 146/78 (S) 99/68 (S) 109/72  Pulse: 85 80 81   Resp: 20 18 19    Temp: 100.3 F (37.9 C) 99.2 F (37.3 C) 98.5 F (36.9 C)   TempSrc:  Oral Oral Oral   SpO2: 97% 94% 93%   Weight:      Height:        Intake/Output Summary (Last 24 hours) at 01/20/2021 1436 Last data filed at 01/20/2021 0400 Gross per 24 hour  Intake 0 ml  Output --  Net 0 ml   Filed Weights   01/19/21 1247  Weight: 84.8 kg    Examination:  Awake Alert, extremely frail, deconditioned, Symmetrical Chest wall movement, Good air movement bilaterally, CTAB RRR,No Gallops,Rubs or new Murmurs, No Parasternal Heave +ve B.Sounds, Abd Soft, No tenderness, No rebound - guarding or rigidity. No Cyanosis, Clubbing or edema, No new Rash or bruise       Data Reviewed: I have personally reviewed  following labs and imaging studies  CBC: Recent Labs  Lab 01/19/21 1242 01/20/21 0332  WBC 10.1 6.1  NEUTROABS 8.7*  --   HGB 12.5* 12.2*  HCT 37.1* 35.4*  MCV 102.5* 100.0  PLT 179 619    Basic Metabolic Panel: Recent Labs  Lab 01/19/21 1242 01/20/21 0332  NA 134* 134*  K 4.0 3.9  CL 101 100  CO2 24 24  GLUCOSE 122* 130*  BUN 13 15  CREATININE 0.79 0.75  CALCIUM 9.0 9.0    GFR: Estimated Creatinine Clearance: 97 mL/min (by C-G formula based on SCr of 0.75 mg/dL).  Liver Function Tests: Recent Labs  Lab 01/19/21 1242 01/20/21 0332  AST 15 15  ALT 10 10  ALKPHOS 60 59  BILITOT 1.3* 1.3*  PROT 6.4* 6.1*  ALBUMIN 3.1* 2.9*    CBG: No results for input(s): GLUCAP in the last 168 hours.   Recent Results (from the past 240 hour(s))  SARS CORONAVIRUS 2 (TAT 6-24 HRS) Nasopharyngeal Nasopharyngeal Swab     Status: None   Collection Time: 01/19/21 10:51 PM   Specimen: Nasopharyngeal Swab  Result Value Ref Range Status   SARS Coronavirus 2 NEGATIVE NEGATIVE Final    Comment: (NOTE) SARS-CoV-2 target nucleic acids are NOT DETECTED.  The SARS-CoV-2 RNA is generally detectable in upper and lower respiratory specimens during the acute phase of infection. Negative results do not preclude SARS-CoV-2 infection, do not rule out co-infections with other pathogens, and should not be used as the sole basis for treatment or other patient management decisions. Negative results must be combined with clinical observations, patient history, and epidemiological information. The expected result is Negative.  Fact Sheet for Patients: SugarRoll.be  Fact Sheet for Healthcare Providers: https://www.woods-mathews.com/  This test is not yet approved or cleared by the [place] FDA and  has been authorized for detection and/or diagnosis of SARS-CoV-2 by FDA under an Emergency Use Authorization (EUA). This EUA will remain  in effect  (meaning this test can be used) for the duration of the COVID-19 declaration under Se ction 564(b)(1) of the Act, 21 U.S.C. section 360bbb-3(b)(1), unless the authorization is terminated or revoked sooner.  Performed at [facility], [place] [address] [place], [place] [address]          Radiology Studies: No results found.      Scheduled Meds: . aspirin  325 mg Oral Daily  . carvedilol  25 mg Oral BID WC  . celecoxib  100 mg Oral q morning  . clonazePAM  1 mg Oral QHS  . cyanocobalamin  1,000 mcg Intramuscular Daily   Followed by  . [START ON 01/23/2021] cyanocobalamin  1,000 mcg Intramuscular Weekly  . DULoxetine  30 mg Oral Daily  .  enoxaparin (LOVENOX) injection  40 mg Subcutaneous Daily  . feeding supplement  237 mL Oral BID BM  . folic acid  1 mg Oral Daily  . melatonin  10 mg Oral QHS  . montelukast  10 mg Oral QHS  . primidone  50 mg Oral QHS  . spironolactone  12.5 mg Oral Daily  . tamsulosin  0.4 mg Oral BID PC   Continuous Infusions: . sodium chloride 250 mL (01/20/21 0616)  . cefTRIAXone (ROCEPHIN)  IV       LOS: 0 days    Phillips Climes, MD [facility]   To contact the attending provider between 7A-7P or the covering provider during after hours 7P-7A, please log into the web site [email or web address] and access using universal [place] password for that web site. If you do not have the password, please call the hospital operator.  01/20/2021, 2:36 PM

## 2021-01-20 NOTE — ED Notes (Signed)
Attempted report x1. 

## 2021-01-20 NOTE — Plan of Care (Signed)
  Problem: Education: Goal: Knowledge of General Education information will improve Description: Including pain rating scale, medication(s)/side effects and non-pharmacologic comfort measures 01/20/2021 0154 by Duard Brady, RN Outcome: Progressing 01/20/2021 0154 by Duard Brady, RN Outcome: Progressing 01/20/2021 0153 by Duard Brady, RN Outcome: Progressing   Problem: Health Behavior/Discharge Planning: Goal: Ability to manage health-related needs will improve 01/20/2021 0154 by Duard Brady, RN Outcome: Progressing 01/20/2021 0154 by Duard Brady, RN Outcome: Progressing 01/20/2021 0153 by Duard Brady, RN Outcome: Progressing   Problem: Clinical Measurements: Goal: Ability to maintain clinical measurements within normal limits will improve 01/20/2021 0154 by Duard Brady, RN Outcome: Progressing 01/20/2021 0154 by Duard Brady, RN Outcome: Progressing 01/20/2021 0153 by Duard Brady, RN Outcome: Progressing Goal: Will remain free from infection 01/20/2021 0154 by Duard Brady, RN Outcome: Progressing 01/20/2021 0154 by Duard Brady, RN Outcome: Progressing 01/20/2021 0153 by Duard Brady, RN Outcome: Progressing Goal: Diagnostic test results will improve 01/20/2021 0154 by Duard Brady, RN Outcome: Progressing 01/20/2021 0154 by Duard Brady, RN Outcome: Progressing 01/20/2021 0153 by Duard Brady, RN Outcome: Progressing Goal: Respiratory complications will improve 01/20/2021 0154 by Duard Brady, RN Outcome: Progressing 01/20/2021 0154 by Duard Brady, RN Outcome: Progressing 01/20/2021 0153 by Duard Brady, RN Outcome: Progressing Goal: Cardiovascular complication will be avoided 01/20/2021 0154 by Duard Brady, RN Outcome: Progressing 01/20/2021 0154 by Duard Brady, RN Outcome: Progressing 01/20/2021 0153 by Duard Brady, RN Outcome: Progressing

## 2021-01-21 ENCOUNTER — Inpatient Hospital Stay (HOSPITAL_COMMUNITY): Payer: Medicare Other

## 2021-01-21 DIAGNOSIS — I10 Essential (primary) hypertension: Secondary | ICD-10-CM | POA: Diagnosis not present

## 2021-01-21 DIAGNOSIS — N3000 Acute cystitis without hematuria: Secondary | ICD-10-CM | POA: Diagnosis not present

## 2021-01-21 LAB — ECHOCARDIOGRAM COMPLETE BUBBLE STUDY
AR max vel: 1.97 cm2
AV Area VTI: 2.05 cm2
AV Area mean vel: 1.91 cm2
AV Mean grad: 8 mmHg
AV Peak grad: 15.2 mmHg
Ao pk vel: 1.95 m/s
Area-P 1/2: 3.03 cm2
S' Lateral: 3 cm

## 2021-01-21 LAB — BASIC METABOLIC PANEL
Anion gap: 9 (ref 5–15)
BUN: 23 mg/dL (ref 8–23)
CO2: 26 mmol/L (ref 22–32)
Calcium: 8.7 mg/dL — ABNORMAL LOW (ref 8.9–10.3)
Chloride: 98 mmol/L (ref 98–111)
Creatinine, Ser: 0.78 mg/dL (ref 0.61–1.24)
GFR, Estimated: >60 mL/min (ref >60)
Glucose, Bld: 122 mg/dL — ABNORMAL HIGH (ref 70–99)
Potassium: 3.7 mmol/L (ref 3.5–5.1)
Sodium: 133 mmol/L — ABNORMAL LOW (ref 135–145)

## 2021-01-21 LAB — CBC
HCT: 35.2 % — ABNORMAL LOW (ref 39.0–52.0)
Hemoglobin: 12.1 g/dL — ABNORMAL LOW (ref 13.0–17.0)
MCH: 34.4 pg — ABNORMAL HIGH (ref 26.0–34.0)
MCHC: 34.4 g/dL (ref 30.0–36.0)
MCV: 100 fL (ref 80.0–100.0)
Platelets: 164 10*3/uL (ref 150–400)
RBC: 3.52 MIL/uL — ABNORMAL LOW (ref 4.22–5.81)
RDW: 12.6 % (ref 11.5–15.5)
WBC: 4.6 10*3/uL (ref 4.0–10.5)
nRBC: 0 % (ref 0.0–0.2)

## 2021-01-21 LAB — TSH: TSH: 2.294 u[IU]/mL (ref 0.350–4.500)

## 2021-01-21 MED ORDER — PERFLUTREN LIPID MICROSPHERE
1.0000 mL | INTRAVENOUS | Status: AC | PRN
  Administered 2021-01-21: 4 mL via INTRAVENOUS
  Filled 2021-01-21: qty 10

## 2021-01-21 NOTE — Progress Notes (Incomplete)
  Echocardiogram 2D Echocardiogram has been performed.  Shanae Luo  Lynnette Caffey 01/21/2021, 1:05 PM

## 2021-01-21 NOTE — Progress Notes (Signed)
PROGRESS NOTE    Travis Reese  XTG:[id number] DOB: 1946/07/24 DOA: 01/19/2021 PCP: Janine Limbo, PA-C    Chief Complaint  Patient presents with  . Dysuria    Brief Narrative:    Travis Reese is a 75 y.o. male with medical history significant for chronic diastolic CHF, mild nonobstructive CAD, depression, anxiety, drug-induced parkinson, and chronic diastolic CHF, now presenting to the emergency department for evaluation of dysuria, fever, fatigue, and general weakness.  Patient requires assistance with bathing and getting out of bed at baseline but has been feeling more fatigued and generally weak for approximately a week or more and has had dysuria.  His wife was unable to help him out of bed today and noted that he had been increasingly weak and fatigued over the past few days.  He reportedly had a fever with EMS.  He denies flank pain but has had subjective fevers and chills.  Denies any focal numbness or weakness.  ED Course: Upon arrival to the ED, patient is found to be afebrile saturating well on room air, and with stable blood pressure.  There was a mild microcytic anemia and normal lactic acid noted in the ED.  Urine was nitrite positive.  Urine culture was ordered and the patient was given a gram of Rocephin.  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[place])   Depression   History of CVA (cerebrovascular accident)   Pressure injury of skin   UTI  - Presents with dysuria, fever, and fatigue  - He is not septic on admission  - Continue with IV Rocephin, urine cultures pending  History of CVA  - Continue ASA   -Patient appears to be forgetful with very minimal confusion, previous work-up indicated very low B12 level at 107, it will be repleted with IM supplements -Seen by his  outpatient neurologist 5/19, with evidence of subacute CVA on MRI done 4/14, will proceed with carotid Dopplers and echo with bubble study, as well we will keep him on telemetry to rule out any A. fib.  B12 deficiency -Starting on supplements, significant other to be educated about IM injections.   Emphysema  - No cough or wheezing  - Continue as-needed albuterol    Depression, anxiety  - Appears stable  - Continue Cymbalta and Klonopin   Chronic diastolic CHF  - Appears compensated  - Continue Aldactone and Coreg    Generalized weakness/deconditioning - PT consulted, will need home health   DVT prophylaxis: Lovenox Code Status: Partial Family Communication: D/W daughter by phone 5/21 Disposition:   Status is: Inpatient  Remains inpatient appropriate because:IV treatments appropriate due to intensity of illness or inability to take PO   Dispo: The patient is from: Home              Anticipated d/c is to: Home              Patient currently is not medically stable to d/c.   Difficult to place patient No       Consultants:  None  Subjective:  Reports still feeling weak, fatigue, reports appetite has improved. Objective: Vitals:   01/20/21 1100 01/20/21 1700 01/20/21 2103 01/21/21 0630  BP: (S) 109/72 (S) 112/68 110/73 117/76  Pulse:  79 73 72  Resp:  18 16  Temp:   98.8 F (37.1 C) 98.2 F (36.8 C)  TempSrc:   Oral Oral  SpO2:   97% 95%  Weight:      Height:        Intake/Output Summary (Last 24 hours) at 01/21/2021 1102 Last data filed at 01/21/2021 0030 Gross per 24 hour  Intake 100 ml  Output 350 ml  Net -250 ml   Filed Weights   01/19/21 1247  Weight: 84.8 kg    Examination:  Awake Alert, frail, deconditioned, slow to respond, easily distracted, but he is oriented x3 Symmetrical Chest wall movement, Good air movement bilaterally, CTAB RRR,No Gallops,Rubs or new Murmurs, No Parasternal Heave +ve B.Sounds, Abd Soft, No tenderness, No  rebound - guarding or rigidity. No Cyanosis, Clubbing or edema, No new Rash or bruise        Data Reviewed: I have personally reviewed following labs and imaging studies  CBC: Recent Labs  Lab 01/19/21 1242 01/20/21 0332 01/21/21 0034  WBC 10.1 6.1 4.6  NEUTROABS 8.7*  --   --   HGB 12.5* 12.2* 12.1*  HCT 37.1* 35.4* 35.2*  MCV 102.5* 100.0 100.0  PLT 179 163 248    Basic Metabolic Panel: Recent Labs  Lab 01/19/21 1242 01/20/21 0332 01/21/21 0034  NA 134* 134* 133*  K 4.0 3.9 3.7  CL 101 100 98  CO2 24 24 26   GLUCOSE 122* 130* 122*  BUN 13 15 23   CREATININE 0.79 0.75 0.78  CALCIUM 9.0 9.0 8.7*    GFR: Estimated Creatinine Clearance: 97 mL/min (by C-G formula based on SCr of 0.78 mg/dL).  Liver Function Tests: Recent Labs  Lab 01/19/21 1242 01/20/21 0332  AST 15 15  ALT 10 10  ALKPHOS 60 59  BILITOT 1.3* 1.3*  PROT 6.4* 6.1*  ALBUMIN 3.1* 2.9*    CBG: No results for input(s): GLUCAP in the last 168 hours.   Recent Results (from the past 240 hour(s))  SARS CORONAVIRUS 2 (TAT 6-24 HRS) Nasopharyngeal Nasopharyngeal Swab     Status: None   Collection Time: 01/19/21 10:51 PM   Specimen: Nasopharyngeal Swab  Result Value Ref Range Status   SARS Coronavirus 2 NEGATIVE NEGATIVE Final    Comment: (NOTE) SARS-CoV-2 target nucleic acids are NOT DETECTED.  The SARS-CoV-2 RNA is generally detectable in upper and lower respiratory specimens during the acute phase of infection. Negative results do not preclude SARS-CoV-2 infection, do not rule out co-infections with other pathogens, and should not be used as the sole basis for treatment or other patient management decisions. Negative results must be combined with clinical observations, patient history, and epidemiological information. The expected result is Negative.  Fact Sheet for Patients: SugarRoll.be  Fact Sheet for Healthcare  Providers: https://www.woods-mathews.com/  This test is not yet approved or cleared by the [place] FDA and  has been authorized for detection and/or diagnosis of SARS-CoV-2 by FDA under an Emergency Use Authorization (EUA). This EUA will remain  in effect (meaning this test can be used) for the duration of the COVID-19 declaration under Se ction 564(b)(1) of the Act, 21 U.S.C. section 360bbb-3(b)(1), unless the authorization is terminated or revoked sooner.  Performed at [facility], [place] [address] [place], [place] [address]          Radiology Studies: No results found.      Scheduled Meds: . aspirin  325 mg Oral Daily  . carvedilol  25 mg Oral BID WC  .  celecoxib  100 mg Oral q morning  . clonazePAM  1 mg Oral QHS  . cyanocobalamin  1,000 mcg Intramuscular Daily   Followed by  . [START ON 01/23/2021] cyanocobalamin  1,000 mcg Intramuscular Weekly  . DULoxetine  30 mg Oral Daily  . enoxaparin (LOVENOX) injection  40 mg Subcutaneous Daily  . feeding supplement  237 mL Oral BID BM  . folic acid  1 mg Oral Daily  . melatonin  10 mg Oral QHS  . montelukast  10 mg Oral QHS  . primidone  50 mg Oral QHS  . spironolactone  12.5 mg Oral Daily  . tamsulosin  0.4 mg Oral BID PC   Continuous Infusions: . sodium chloride 250 mL (01/20/21 0616)  . cefTRIAXone (ROCEPHIN)  IV 1 g (01/20/21 2157)     LOS: 1 day    Phillips Climes, MD [facility]   To contact the attending provider between 7A-7P or the covering provider during after hours 7P-7A, please log into the web site [email or web address] and access using universal [place] password for that web site. If you do not have the password, please call the hospital operator.  01/21/2021, 11:02 AM

## 2021-01-21 NOTE — Progress Notes (Signed)
Attempted carotid artery duplex, however patient is eating. Will attempt again as schedule permits.  01/21/2021 9:57 AM Kelby Aline., MHA, RVT, RDCS, RDMS

## 2021-01-21 NOTE — Progress Notes (Signed)
Physical Therapy Treatment Patient Details Name: Travis Reese MRN: [id number] DOB: August 20, 1946 Today's Date: 01/21/2021    History of Present Illness Pt adm 5/20 with weakness, dysuria, and fatigue. Pt found to have UTI. PMH - Parkinsonism (drug induced vs idiopathic), CVA, Anxiety, AVR, Arthritis, CHF, Cubital tunnel syndrome, DDD (degenerative disc disease), Depression, HTN, Scoliosis    PT Comments    Pt continues to require significant assist for mobility. When asked if family can provide this much assist pt states "I think so." Per nurse on speaking to daughter pt needs to be fairly independent with mobility prior to return home. Changed DC recommendation to SNF. Pt was recently at [facility].    Follow Up Recommendations  SNF     Equipment Recommendations  None recommended by PT    Recommendations for Other Services       Precautions / Restrictions Precautions Precautions: Fall    Mobility  Bed Mobility Overal bed mobility: Needs Assistance Bed Mobility: Supine to Sit     Supine to sit: Mod assist;HOB elevated     General bed mobility comments: Assist to initiate any movement. Assist to bring legs off of bed and elevate trunk into sitting    Transfers Overall transfer level: Needs assistance Equipment used: 4-wheeled walker Transfers: Sit to/from Stand Sit to Stand: Mod assist;From elevated surface         General transfer comment: Assist to bring hips up and for balance. Pt with posterior lean  Ambulation/Gait Ambulation/Gait assistance: Min assist Gait Distance (Feet): 40 Feet Assistive device: 4-wheeled walker Gait Pattern/deviations: Step-through pattern;Decreased step length - right;Decreased step length - left;Shuffle;Trunk flexed Gait velocity: decr Gait velocity interpretation: <1.31 ft/sec, indicative of household ambulator General Gait Details: Assist for balance. Initially pt with posterior lean. With turning pt needs assist to move walker and  frequent cues to continue stepping around. Verbal cues to stand more erect and step closer to the rollator. Distance limited by incontinent of bowel in the hallway.   Stairs             Wheelchair Mobility    Modified Rankin (Stroke Patients Only)       Balance Overall balance assessment: Needs assistance Sitting-balance support: No upper extremity supported;Feet supported Sitting balance-Leahy Scale: Fair     Standing balance support: Bilateral upper extremity supported Standing balance-Leahy Scale: Poor Standing balance comment: rollator and mod assist initially and then min assist for static standing                            Cognition Arousal/Alertness: Awake/alert Behavior During Therapy: Flat affect Overall Cognitive Status: No family/caregiver present to determine baseline cognitive functioning Area of Impairment: Problem solving;Following commands                       Following Commands: Follows one step commands with increased time;Follows one step commands inconsistently     Problem Solving: Decreased initiation;Slow processing;Requires verbal cues;Requires tactile cues General Comments: Likely baseline due to Parkinsonism      Exercises      General Comments        Pertinent Vitals/Pain      Home Living                      Prior Function            PT Goals (current goals can now be found in the care  plan section) Acute Rehab PT Goals Patient Stated Goal: not stated Progress towards PT goals: Not progressing toward goals - comment    Frequency    Min 2X/week      PT Plan Discharge plan needs to be updated;Frequency needs to be updated    Co-evaluation              AM-PAC PT "6 Clicks" Mobility   Outcome Measure  Help needed turning from your back to your side while in a flat bed without using bedrails?: A Lot Help needed moving from lying on your back to sitting on the side of a flat bed  without using bedrails?: A Lot Help needed moving to and from a bed to a chair (including a wheelchair)?: A Lot Help needed standing up from a chair using your arms (e.g., wheelchair or bedside chair)?: A Lot Help needed to walk in hospital room?: A Little Help needed climbing 3-5 steps with a railing? : A Lot 6 Click Score: 13    End of Session Equipment Utilized During Treatment: Gait belt Activity Tolerance: Patient limited by fatigue;Other (comment) (Incontinent of bowel in hallway) Patient left: in chair;with call bell/phone within reach;with chair alarm set Nurse Communication: Mobility status;Need for lift equipment Charlaine Dalton may be helpful for pivot transfer) PT Visit Diagnosis: Other abnormalities of gait and mobility (R26.89);Muscle weakness (generalized) (M62.81);History of falling (Z91.81);Other symptoms and signs involving the nervous system (S97.026)     Time: 3785-8850 PT Time Calculation (min) (ACUTE ONLY): 27 min  Charges:  $Gait Training: 23-37 mins                     [place]y Pager [phone] Office [place] 01/21/2021, 4:48 PM

## 2021-01-22 ENCOUNTER — Telehealth: Payer: Self-pay | Admitting: Neurology

## 2021-01-22 ENCOUNTER — Encounter (HOSPITAL_COMMUNITY): Payer: Medicare Other

## 2021-01-22 ENCOUNTER — Inpatient Hospital Stay (HOSPITAL_COMMUNITY): Payer: Medicare Other

## 2021-01-22 ENCOUNTER — Inpatient Hospital Stay (HOSPITAL_COMMUNITY): Admit: 2021-01-22 | Payer: Medicare Other

## 2021-01-22 DIAGNOSIS — I639 Cerebral infarction, unspecified: Secondary | ICD-10-CM

## 2021-01-22 DIAGNOSIS — Z8673 Personal history of transient ischemic attack (TIA), and cerebral infarction without residual deficits: Secondary | ICD-10-CM | POA: Diagnosis not present

## 2021-01-22 DIAGNOSIS — N3000 Acute cystitis without hematuria: Secondary | ICD-10-CM | POA: Diagnosis not present

## 2021-01-22 LAB — BASIC METABOLIC PANEL
Anion gap: 11 (ref 5–15)
BUN: 14 mg/dL (ref 8–23)
CO2: 22 mmol/L (ref 22–32)
Calcium: 8.5 mg/dL — ABNORMAL LOW (ref 8.9–10.3)
Chloride: 98 mmol/L (ref 98–111)
Creatinine, Ser: 0.63 mg/dL (ref 0.61–1.24)
GFR, Estimated: >60 mL/min (ref >60)
Glucose, Bld: 94 mg/dL (ref 70–99)
Potassium: 4 mmol/L (ref 3.5–5.1)
Sodium: 131 mmol/L — ABNORMAL LOW (ref 135–145)

## 2021-01-22 LAB — URINE CULTURE: Culture: >=100000 — AB

## 2021-01-22 LAB — MAGNESIUM: Magnesium: 1.8 mg/dL (ref 1.7–2.4)

## 2021-01-22 LAB — CBC
HCT: 35.7 % — ABNORMAL LOW (ref 39.0–52.0)
Hemoglobin: 12.7 g/dL — ABNORMAL LOW (ref 13.0–17.0)
MCH: 34.3 pg — ABNORMAL HIGH (ref 26.0–34.0)
MCHC: 35.6 g/dL (ref 30.0–36.0)
MCV: 96.5 fL (ref 80.0–100.0)
Platelets: 158 10*3/uL (ref 150–400)
RBC: 3.7 MIL/uL — ABNORMAL LOW (ref 4.22–5.81)
RDW: 11.9 % (ref 11.5–15.5)
WBC: 5.8 10*3/uL (ref 4.0–10.5)
nRBC: 0 % (ref 0.0–0.2)

## 2021-01-22 LAB — PHOSPHORUS: Phosphorus: 3.4 mg/dL (ref 2.5–4.6)

## 2021-01-22 MED ORDER — ENSURE ENLIVE PO LIQD
237.0000 mL | Freq: Three times a day (TID) | ORAL | Status: DC
  Administered 2021-01-22 – 2021-01-24 (×6): 237 mL via ORAL
  Filled 2021-01-22: qty 237

## 2021-01-22 MED ORDER — CEFAZOLIN SODIUM-DEXTROSE 1-4 GM/50ML-% IV SOLN
1.0000 g | Freq: Three times a day (TID) | INTRAVENOUS | Status: DC
  Administered 2021-01-22 – 2021-01-24 (×7): 1 g via INTRAVENOUS
  Filled 2021-01-22 (×9): qty 50

## 2021-01-22 MED ORDER — ADULT MULTIVITAMIN W/MINERALS CH
1.0000 | ORAL_TABLET | Freq: Every day | ORAL | Status: DC
  Administered 2021-01-22 – 2021-01-24 (×3): 1 via ORAL
  Filled 2021-01-22 (×3): qty 1

## 2021-01-22 MED ORDER — GADOBUTROL 1 MMOL/ML IV SOLN
8.5000 mL | Freq: Once | INTRAVENOUS | Status: AC | PRN
  Administered 2021-01-22: 8.5 mL via INTRAVENOUS

## 2021-01-22 NOTE — Progress Notes (Signed)
Lower extremity venous has been completed.   Preliminary results in CV Proc.   Abram Sander 01/22/2021 2:13 PM

## 2021-01-22 NOTE — Telephone Encounter (Signed)
Patient is in the hospital at [facility]. He has had several tests done that Dr. Carles Collet ordered. His significant other, Theda Sers (no DPR on file to share PHI), called to let Dr. Carles Collet know.  For call backs, please call someone on DPR.

## 2021-01-22 NOTE — Telephone Encounter (Signed)
Could this happen if he is in there through hospital?

## 2021-01-22 NOTE — Telephone Encounter (Signed)
That's something they can discuss with in patient team.  We don't direct that part

## 2021-01-22 NOTE — Progress Notes (Signed)
Initial Nutrition Assessment  DOCUMENTATION CODES:   Not applicable  INTERVENTION:  -Ensure Enlive po TID, each supplement provides 350 kcal and 20 grams of protein -Magic cup BID with meals, each supplement provides 290 kcal and 9 grams of protein -MVI with minerals daily  NUTRITION DIAGNOSIS:   Inadequate oral intake related to decreased appetite as evidenced by other (comment) (per MST report).  GOAL:   Patient will meet greater than or equal to 90% of their needs  MONITOR:   PO intake,Supplement acceptance,Labs,Weight trends,I & O's  REASON FOR ASSESSMENT:   Malnutrition Screening Tool    ASSESSMENT:   Pt with PMH significant for CHF, CAD, depression/anxiety, drug-induced Parkinson, and h/o CVA admitted with UTI.  Pt unavailable at time of RD visit. No PO intake documented. Per RN, pt has been doing well with Ensure BID and reports appetite/intake is improving. Will provide additional supplements for pt.   Limited weight history available for review, though no significant weight changes noted based on available weight readings.   UOP: 1068ml x24 hours I/O: -1236ml since admit  Medications: . aspirin  325 mg Oral Daily  . carvedilol  25 mg Oral BID WC  . celecoxib  100 mg Oral q morning  . clonazePAM  1 mg Oral QHS  . [START ON 01/23/2021] cyanocobalamin  1,000 mcg Intramuscular Weekly  . DULoxetine  30 mg Oral Daily  . enoxaparin (LOVENOX) injection  40 mg Subcutaneous Daily  . feeding supplement  237 mL Oral TID BM  . folic acid  1 mg Oral Daily  . melatonin  10 mg Oral QHS  . montelukast  10 mg Oral QHS  . multivitamin with minerals  1 tablet Oral Daily  . primidone  50 mg Oral QHS  . spironolactone  12.5 mg Oral Daily  . tamsulosin  0.4 mg Oral BID PC   Labs: Recent Labs  Lab 01/20/21 0332 01/21/21 0034 01/22/21 0415  NA 134* 133* 131*  K 3.9 3.7 4.0  CL 100 98 98  CO2 24 26 22   BUN 15 23 14   CREATININE 0.75 0.78 0.63  CALCIUM 9.0 8.7* 8.5*   MG  --   --  1.8  PHOS  --   --  3.4  GLUCOSE 130* 122* 94    Diet Order:   Diet Order            Diet Heart Room service appropriate? No; Fluid consistency: Thin  Diet effective now                 EDUCATION NEEDS:   No education needs have been identified at this time  Skin:  Skin Assessment: Skin Integrity Issues: Skin Integrity Issues:: Stage I,Stage II,DTI DTI: R heel Stage I: L heel Stage II: coccyx  Last BM:  5/20  Height:   Ht Readings from Last 1 Encounters:  01/19/21 6\' 2"  (1.88 m)    Weight:  Wt Readings from Last 10 Encounters:  01/19/21 84.8 kg  01/18/21 84.8 kg  03/30/20 (!) 93.4 kg  04/08/19 91.2 kg  04/05/19 90 kg  01/20/19 93 kg  09/17/18 94.3 kg  06/08/18 92.4 kg  05/18/18 93.4 kg  05/15/18 92.5 kg   BMI:  Body mass index is 24.01 kg/m.  Estimated Nutritional Needs:   Kcal:  2100-2300  Protein:  105-120g  Fluid:  >2L    Larkin Ina, MS, RD, LDN RD pager number and weekend/on-call pager number located in [place].

## 2021-01-22 NOTE — Evaluation (Signed)
Occupational Therapy Evaluation Patient Details Name: Travis Reese MRN: [id number] DOB: 1945/09/10 Today's Date: 01/22/2021    History of Present Illness Pt adm 5/20 with weakness, dysuria, and fatigue. Pt found to have UTI. PMH - Parkinsonism (drug induced vs idiopathic), CVA, Anxiety, AVR, Arthritis, CHF, Cubital tunnel syndrome, DDD (degenerative disc disease), Depression, HTN, Scoliosis   Clinical Impression   Pt is from home, and while he needs assist to get out of bed (and has lift chair) he has been mobile with DME at supervision level. He gets min A for bathing and dressing, but does his own grooming, self feeding etc. Today he is mod A for bed mobility with assist for LB and trunk elevation, initially very strong posterior lean (able to progress to intermittent assist EOB for seated balance) mod A from elevated bed for SPT with RW, and set up for grooming activities in the chair. Max A for LB ADL. At this time Pt will require SNF post-acute for more intensive rehab schedule to return to baseline, and maximize safety and independence for ADL and functional transfers.     Follow Up Recommendations  SNF    Equipment Recommendations       Recommendations for Other Services       Precautions / Restrictions Precautions Precautions: Fall Restrictions Weight Bearing Restrictions: No      Mobility Bed Mobility Overal bed mobility: Needs Assistance Bed Mobility: Supine to Sit     Supine to sit: Mod assist;HOB elevated     General bed mobility comments: Assist to initiate any movement. Assist to bring legs off of bed and elevate trunk into sitting    Transfers Overall transfer level: Needs assistance Equipment used: Rolling walker (2 wheeled) Transfers: Sit to/from Stand Sit to Stand: Mod assist;From elevated surface         General transfer comment: Pt pulls up on RW, Assist to bring hips up and for balance. Pt with posterior lean    Balance Overall balance  assessment: Needs assistance Sitting-balance support: No upper extremity supported;Feet supported Sitting balance-Leahy Scale: Fair     Standing balance support: Bilateral upper extremity supported Standing balance-Leahy Scale: Poor Standing balance comment: RW and mod assist initially and then min assist for static standing                           ADL either performed or assessed with clinical judgement   ADL Overall ADL's : Needs assistance/impaired Eating/Feeding: Set up;Sitting   Grooming: Wash/dry face;Wash/dry hands;Set up;Sitting Grooming Details (indicate cue type and reason): in recliner with back support Upper Body Bathing: Moderate assistance Upper Body Bathing Details (indicate cue type and reason): for back Lower Body Bathing: Moderate assistance Lower Body Bathing Details (indicate cue type and reason): assist for knees down Upper Body Dressing : Min guard   Lower Body Dressing: Maximal assistance Lower Body Dressing Details (indicate cue type and reason): to don socks Toilet Transfer: Moderate assistance;Stand-pivot;RW Toilet Transfer Details (indicate cue type and reason): assist for boost and balance, gait belt to assist with posterior lean Toileting- Clothing Manipulation and Hygiene: Maximal assistance;Sit to/from stand       Functional mobility during ADLs: Moderate assistance;Cueing for safety;Rolling walker (vc for safe hand placement, SPT only) General ADL Comments: decreased balance, safety awareness     Vision Patient Visual Report: No change from baseline       Perception     Praxis  Pertinent Vitals/Pain Pain Assessment: No/denies pain     Hand Dominance     Extremity/Trunk Assessment Upper Extremity Assessment Upper Extremity Assessment: Generalized weakness   Lower Extremity Assessment Lower Extremity Assessment: Generalized weakness       Communication Communication Communication: No difficulties   Cognition  Arousal/Alertness: Awake/alert Behavior During Therapy: Flat affect Overall Cognitive Status: History of cognitive impairments - at baseline Area of Impairment: Problem solving;Following commands                       Following Commands: Follows one step commands with increased time;Follows one step commands inconsistently     Problem Solving: Decreased initiation;Slow processing;Requires verbal cues;Requires tactile cues General Comments: Likely baseline due to Parkinsonism   General Comments       Exercises     Shoulder Instructions      Home Living Family/patient expects to be discharged to:: Private residence Living Arrangements: Spouse/significant other Available Help at Discharge: Family;Available 24 hours/day (son is able to physically assist with transfers not wife) Type of Home: House Home Access: Ramped entrance     Home Layout: One level     Bathroom Shower/Tub: Tub/shower unit;Walk-in shower   Bathroom Toilet: Standard     Home Equipment: Environmental consultant - 4 wheels;Shower seat;Grab bars - toilet;Grab bars - tub/shower;Cane - quad   Additional Comments: lift chair      Prior Functioning/Environment Level of Independence: Needs assistance  Gait / Transfers Assistance Needed: Pt reports he was amb with rollator mostly on his own. Notes report he requires assist to get out of bed ADL's / Homemaking Assistance Needed: Pt at least min A for ADL, sponge baths            OT Problem List: Decreased strength;Decreased activity tolerance;Impaired balance (sitting and/or standing);Decreased safety awareness;Decreased knowledge of use of DME or AE      OT Treatment/Interventions: Self-care/ADL training;Therapeutic exercise;DME and/or AE instruction;Manual therapy;Therapeutic activities;Patient/family education;Balance training    OT Goals(Current goals can be found in the care plan section) Acute Rehab OT Goals Patient Stated Goal: get stronger OT Goal  Formulation: With patient Time For Goal Achievement: 02/05/21 Potential to Achieve Goals: Good ADL Goals Pt Will Perform Grooming: sitting;with modified independence Pt Will Perform Upper Body Bathing: with set-up;sitting Pt Will Perform Lower Body Bathing: with min guard assist;sitting/lateral leans Pt Will Transfer to Toilet: with min guard assist;ambulating Pt Will Perform Toileting - Clothing Manipulation and hygiene: with min guard assist;sit to/from stand  OT Frequency: Min 2X/week   Barriers to D/C:            Co-evaluation              AM-PAC OT "6 Clicks" Daily Activity     Outcome Measure Help from another person eating meals?: A Little Help from another person taking care of personal grooming?: A Little Help from another person toileting, which includes using toliet, bedpan, or urinal?: A Lot Help from another person bathing (including washing, rinsing, drying)?: A Lot Help from another person to put on and taking off regular upper body clothing?: A Little Help from another person to put on and taking off regular lower body clothing?: A Lot 6 Click Score: 15   End of Session Equipment Utilized During Treatment: Gait belt;Rolling walker Nurse Communication: Mobility status  Activity Tolerance: Patient tolerated treatment well Patient left: in bed;with call bell/phone within reach;with bed alarm set;with family/visitor present  OT Visit Diagnosis: Unsteadiness on feet (R26.81);Other  abnormalities of gait and mobility (R26.89);Repeated falls (R29.6);History of falling (Z91.81);Muscle weakness (generalized) (M62.81)                Time: 9794-8016 OT Time Calculation (min): 26 min Charges:  OT General Charges $OT Visit: 1 Visit OT Evaluation $OT Eval Moderate Complexity: 1 Mod OT Treatments $Self Care/Home Management : 8-22 mins  Jesse Sans OTR/L Acute Rehabilitation Services Pager: [phone] Office: [place] 01/22/2021, 12:21 PM

## 2021-01-22 NOTE — Telephone Encounter (Signed)
Patient's daughter called requesting to speak with a nurse. She'd like to know if her dad can have the DAT scan while in the hospital?

## 2021-01-22 NOTE — Telephone Encounter (Signed)
He had MRI brain but I wanted DaT scan, which still needs to be scheduled and we are likely waiting on authorization

## 2021-01-22 NOTE — Progress Notes (Signed)
Carotid artery duplex has been completed. Preliminary results can be found in CV Proc through chart review.  This study was begun and completed before being cancelled.  01/22/21 2:42 PM Carlos Levering RVT

## 2021-01-22 NOTE — Telephone Encounter (Signed)
FYI

## 2021-01-22 NOTE — Progress Notes (Signed)
PROGRESS NOTE    Travis Reese  JXB:[id number] DOB: June 06, 1946 DOA: 01/19/2021 PCP: Janine Limbo, PA-C    Chief Complaint  Patient presents with  . Dysuria    Brief Narrative:    Travis Reese is a 75 y.o. male with medical history significant for chronic diastolic CHF, mild nonobstructive CAD, depression, anxiety, drug-induced parkinson, and chronic diastolic CHF, now presenting to the emergency department for evaluation of dysuria, fever, fatigue, and general weakness.  Patient requires assistance with bathing and getting out of bed at baseline but has been feeling more fatigued and generally weak for approximately a week or more and has had dysuria.    Is any focal deficits, his work-up was significant for UTI, admitted for further work-up.  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 coli, pansensitive, will change to cefazolin.  History of CVA  - Continue ASA   -Patient appears to be forgetful with very minimal confusion, previous work-up indicated very low B12 level at 107, it will be repleted with IM supplements -Seen by his outpatient neurologist 5/19, with evidence of subacute CVA on MRI done 4/14, with work-up, 2D echo cannot rule PFO, so we will check venous Dopplers, discussed MRI brain findings with neurology, proceed with repeat MRI, MRA head and neck, if work-up is negative then he will need cardiac monitor, continue with telemetry monitoring during hospital stay.    B12 deficiency -Starting on supplements, significant other to be educated about IM  injections.   Emphysema  - No cough or wheezing  - Continue as-needed albuterol    Depression, anxiety  - Appears stable  - Continue Cymbalta and Klonopin   Chronic diastolic CHF  - Appears compensated  - Continue Aldactone and Coreg    Generalized weakness/deconditioning - PT consulted, will need home health   DVT prophylaxis: Lovenox Code Status: Partial Family Communication: Left daughter voicemail 5/23 Disposition:   Status is: Inpatient  Remains inpatient appropriate because:IV treatments appropriate due to intensity of illness or inability to take PO   Dispo: The patient is from: Home              Anticipated d/c is to: SNF              Patient currently is not medically stable to d/c.   Difficult to place patient No       Consultants:  None  Subjective:  Reports still feeling weak, fatigue, reports appetite has improved. Objective: Vitals:   01/21/21 0630 01/21/21 1245 01/21/21 2100 01/22/21 1000  BP: 117/76 124/71 140/78 (!) 147/88  Pulse: 72 78 79 88  Resp: 16 18 18 17   Temp: 98.2 F (36.8 C)  98 F (36.7 C) 98.5 F (36.9 C)  TempSrc: Oral  Oral Oral  SpO2: 95% 95% 93%   Weight:      Height:        Intake/Output Summary (Last 24 hours) at 01/22/2021 1238 Last data filed at 01/22/2021 1308 Gross per 24 hour  Intake 100 ml  Output 1070  ml  Net -970 ml   Filed Weights   01/19/21 1247  Weight: 84.8 kg    Examination:  Awake Alert, frail and deconditioned, slow to respond, easily distracted, he is oriented x3 though Symmetrical Chest wall movement, Good air movement bilaterally, CTAB RRR,No Gallops,Rubs or new Murmurs, No Parasternal Heave +ve B.Sounds, Abd Soft, No tenderness, No rebound - guarding or rigidity. No Cyanosis, Clubbing or edema, No new Rash or bruise     Data Reviewed: I have personally reviewed following labs and imaging studies  CBC: Recent Labs  Lab 01/19/21 1242 01/20/21 0332 01/21/21 0034 01/22/21 0415   WBC 10.1 6.1 4.6 5.8  NEUTROABS 8.7*  --   --   --   HGB 12.5* 12.2* 12.1* 12.7*  HCT 37.1* 35.4* 35.2* 35.7*  MCV 102.5* 100.0 100.0 96.5  PLT 179 163 164 0000000    Basic Metabolic Panel: Recent Labs  Lab 01/19/21 1242 01/20/21 0332 01/21/21 0034 01/22/21 0415  NA 134* 134* 133* 131*  K 4.0 3.9 3.7 4.0  CL 101 100 98 98  CO2 24 24 26 22   GLUCOSE 122* 130* 122* 94  BUN 13 15 23 14   CREATININE 0.79 0.75 0.78 0.63  CALCIUM 9.0 9.0 8.7* 8.5*  MG  --   --   --  1.8  PHOS  --   --   --  3.4    GFR: Estimated Creatinine Clearance: 97 mL/min (by C-G formula based on SCr of 0.63 mg/dL).  Liver Function Tests: Recent Labs  Lab 01/19/21 1242 01/20/21 0332  AST 15 15  ALT 10 10  ALKPHOS 60 59  BILITOT 1.3* 1.3*  PROT 6.4* 6.1*  ALBUMIN 3.1* 2.9*    CBG: No results for input(s): GLUCAP in the last 168 hours.   Recent Results (from the past 240 hour(s))  Urine culture     Status: Abnormal   Collection Time: 01/19/21 12:42 PM   Specimen: Urine, Random  Result Value Ref Range Status   Specimen Description URINE, RANDOM  Final   Special Requests   Final    NONE Performed at [facility], [address] [place], [place] [address]    Culture >=100,000 COLONIES/mL ESCHERICHIA COLI (A)  Final   Report Status 01/22/2021 FINAL  Final   Organism ID, Bacteria ESCHERICHIA COLI (A)  Final      Susceptibility   Escherichia coli - MIC*    AMPICILLIN 8 SENSITIVE Sensitive     CEFAZOLIN <=4 SENSITIVE Sensitive     CEFEPIME <=0.12 SENSITIVE Sensitive     CEFTRIAXONE <=0.25 SENSITIVE Sensitive     CIPROFLOXACIN <=0.25 SENSITIVE Sensitive     GENTAMICIN <=1 SENSITIVE Sensitive     IMIPENEM <=0.25 SENSITIVE Sensitive     NITROFURANTOIN <=16 SENSITIVE Sensitive     TRIMETH/SULFA <=20 SENSITIVE Sensitive     AMPICILLIN/SULBACTAM <=2 SENSITIVE Sensitive     PIP/TAZO <=4 SENSITIVE Sensitive     * >=100,000 COLONIES/mL ESCHERICHIA COLI  SARS CORONAVIRUS 2 (TAT 6-24 HRS)  Nasopharyngeal Nasopharyngeal Swab     Status: None   Collection Time: 01/19/21 10:51 PM   Specimen: Nasopharyngeal Swab  Result Value Ref Range Status   SARS Coronavirus 2 NEGATIVE NEGATIVE Final    Comment: (NOTE) SARS-CoV-2 target nucleic acids are NOT DETECTED.  The SARS-CoV-2 RNA is generally detectable in upper and lower respiratory specimens during the acute phase of infection. Negative results do not preclude SARS-CoV-2 infection, do not rule out co-infections with other pathogens,  and should not be used as the sole basis for treatment or other patient management decisions. Negative results must be combined with clinical observations, patient history, and epidemiological information. The expected result is Negative.  Fact Sheet for Patients: SugarRoll.be  Fact Sheet for Healthcare Providers: https://www.woods-mathews.com/  This test is not yet approved or cleared by the [place] FDA and  has been authorized for detection and/or diagnosis of SARS-CoV-2 by FDA under an Emergency Use Authorization (EUA). This EUA will remain  in effect (meaning this test can be used) for the duration of the COVID-19 declaration under Se ction 564(b)(1) of the Act, 21 U.S.C. section 360bbb-3(b)(1), unless the authorization is terminated or revoked sooner.  Performed at [facility], [place] [address], [place], [place] [address]          Radiology Studies: ECHOCARDIOGRAM COMPLETE BUBBLE STUDY  Result Date: 01/21/2021    ECHOCARDIOGRAM REPORT   Patient Name:   Travis Reese Date of Exam: 01/21/2021 Medical Rec #:  [id number]       Height:       74.0 in Accession #:    [id number]      Weight:       187.0 lb Date of Birth:  1946/05/30      BSA:          2.112 m Patient Age:    19 years        BP:           117/76 mmHg Patient Gender: M               HR:           72 bpm. Exam Location:  Inpatient Procedure: 2D Echo, Cardiac Doppler, Color  Doppler and Saline Contrast Bubble            Study Indications:    Stroke  History:        Patient has prior history of Echocardiogram examinations, most                 recent 04/08/2019. CHF; Risk Factors:Hypertension.  Sonographer:    Cammy Brochure Referring Phys: 50 [place]  1. Left ventricular ejection fraction, by estimation, is 55 to 60%. The left ventricle has normal function. The left ventricle has no regional wall motion abnormalities. Left ventricular diastolic parameters are consistent with Grade I diastolic dysfunction (impaired relaxation).  2. Right ventricular systolic function is normal. The right ventricular size is normal.  3. Left atrial size was mildly dilated.  4. Saline microbubbles were seen in the left ventricle, however, I cannot determine if it was within 5 beats - this was after valsalva. cannot exclude PFO vs late intrapulmonary shunting.  5. The mitral valve is grossly normal. No evidence of mitral valve regurgitation.  6. The aortic valve is tricuspid. Aortic valve regurgitation is not visualized. Conclusion(s)/Recommendation(s): Cannot exclude ASD/PFO. Consider transesophageal echocardiogram, if clinically indicated. FINDINGS  Left Ventricle: Left ventricular ejection fraction, by estimation, is 55 to 60%. The left ventricle has normal function. The left ventricle has no regional wall motion abnormalities. The left ventricular internal cavity size was normal in size. There is  no left ventricular hypertrophy. Left ventricular diastolic parameters are consistent with Grade I diastolic dysfunction (impaired relaxation). Indeterminate filling pressures. Right Ventricle: The right ventricular size is normal. No increase in right ventricular wall thickness. Right ventricular systolic function is normal. Left Atrium: Left atrial size was mildly dilated. Right Atrium: Right  atrial size was normal in size. Pericardium: There is no evidence of pericardial effusion.  Mitral Valve: The mitral valve is grossly normal. No evidence of mitral valve regurgitation. Tricuspid Valve: The tricuspid valve is grossly normal. Tricuspid valve regurgitation is trivial. Aortic Valve: The aortic valve is tricuspid. Aortic valve regurgitation is not visualized. Aortic valve mean gradient measures 8.0 mmHg. Aortic valve peak gradient measures 15.2 mmHg. Aortic valve area, by VTI measures 2.05 cm. There is a bioprosthetic valve present in the aortic position. Pulmonic Valve: The pulmonic valve was normal in structure. Pulmonic valve regurgitation is not visualized. Aorta: The aortic root and ascending aorta are structurally normal, with no evidence of dilitation. IAS/Shunts: Cannot exclude PFO vs late intrapulmonary shunting. Agitated saline contrast was given intravenously to evaluate for intracardiac shunting.  LEFT VENTRICLE PLAX 2D LVIDd:         4.10 cm  Diastology LVIDs:         3.00 cm  LV e' medial:    5.22 cm/s LV PW:         1.20 cm  LV E/e' medial:  11.2 LV IVS:        0.90 cm  LV e' lateral:   11.40 cm/s LVOT diam:     2.40 cm  LV E/e' lateral: 5.1 LV SV:         75 LV SV Index:   35 LVOT Area:     4.52 cm  RIGHT VENTRICLE            IVC RV S prime:     8.92 cm/s  IVC diam: 0.90 cm LEFT ATRIUM             Index       RIGHT ATRIUM           Index LA diam:        2.80 cm 1.33 cm/m  RA Area:     21.00 cm LA Vol (A2C):   71.7 ml 33.95 ml/m RA Volume:   72.10 ml  34.13 ml/m LA Vol (A4C):   76.2 ml 36.08 ml/m LA Biplane Vol: 77.5 ml 36.69 ml/m  AORTIC VALVE AV Area (Vmax):    1.97 cm AV Area (Vmean):   1.91 cm AV Area (VTI):     2.05 cm AV Vmax:           195.00 cm/s AV Vmean:          130.000 cm/s AV VTI:            0.365 m AV Peak Grad:      15.2 mmHg AV Mean Grad:      8.0 mmHg LVOT Vmax:         84.80 cm/s LVOT Vmean:        54.800 cm/s LVOT VTI:          0.165 m LVOT/AV VTI ratio: 0.45  AORTA Ao Root diam: 2.90 cm MITRAL VALVE MV Area (PHT): 3.03 cm    SHUNTS MV Decel Time:  250 msec    Systemic VTI:  0.16 m MV E velocity: 58.30 cm/s  Systemic Diam: 2.40 cm MV A velocity: 95.50 cm/s MV E/A ratio:  0.61 [name] Bishop MD Electronically signed by [name] Bishop MD Signature Date/Time: 01/21/2021/1:08:51 PM    Final         Scheduled Meds: . aspirin  325 mg Oral Daily  . carvedilol  25 mg Oral BID WC  . celecoxib  100 mg Oral q  morning  . clonazePAM  1 mg Oral QHS  . [START ON 01/23/2021] cyanocobalamin  1,000 mcg Intramuscular Weekly  . DULoxetine  30 mg Oral Daily  . enoxaparin (LOVENOX) injection  40 mg Subcutaneous Daily  . feeding supplement  237 mL Oral BID BM  . folic acid  1 mg Oral Daily  . melatonin  10 mg Oral QHS  . montelukast  10 mg Oral QHS  . primidone  50 mg Oral QHS  . spironolactone  12.5 mg Oral Daily  . tamsulosin  0.4 mg Oral BID PC   Continuous Infusions: . sodium chloride 250 mL (01/20/21 0616)  . cefTRIAXone (ROCEPHIN)  IV Stopped (01/21/21 2220)     LOS: 2 days    Phillips Climes, MD [facility]   To contact the attending provider between 7A-7P or the covering provider during after hours 7P-7A, please log into the web site [email or web address] and access using universal [place] password for that web site. If you do not have the password, please call the hospital operator.  01/22/2021, 12:38 PM

## 2021-01-22 NOTE — Telephone Encounter (Signed)
Spoke with daughter voiced understanding.

## 2021-01-23 ENCOUNTER — Other Ambulatory Visit: Payer: Self-pay | Admitting: Medical

## 2021-01-23 DIAGNOSIS — I639 Cerebral infarction, unspecified: Secondary | ICD-10-CM

## 2021-01-23 DIAGNOSIS — Z8673 Personal history of transient ischemic attack (TIA), and cerebral infarction without residual deficits: Secondary | ICD-10-CM

## 2021-01-23 DIAGNOSIS — N3000 Acute cystitis without hematuria: Secondary | ICD-10-CM | POA: Diagnosis not present

## 2021-01-23 MED ORDER — FOLIC ACID 1 MG PO TABS: 1.0000 mg | ORAL_TABLET | Freq: Every day | ORAL | 0 refills | Status: AC

## 2021-01-23 MED ORDER — CEPHALEXIN 500 MG PO CAPS: 500.0000 mg | ORAL_CAPSULE | Freq: Four times a day (QID) | ORAL | 0 refills | Status: AC

## 2021-01-23 MED ORDER — ENSURE ENLIVE PO LIQD: 237.0000 mL | Freq: Three times a day (TID) | ORAL | 12 refills | Status: DC

## 2021-01-23 MED ORDER — ACETAMINOPHEN 325 MG PO TABS: 650.0000 mg | ORAL_TABLET | Freq: Four times a day (QID) | ORAL | Status: DC | PRN

## 2021-01-23 MED ORDER — CYANOCOBALAMIN 1000 MCG/ML IJ SOLN: 1000.0000 ug | INTRAMUSCULAR | 0 refills | Status: DC

## 2021-01-23 MED ORDER — "SYRINGE 18G X 1-1/2"" 3 ML MISC": 0 refills | Status: DC

## 2021-01-23 NOTE — NC FL2 (Signed)
[place] LEVEL OF CARE SCREENING TOOL     IDENTIFICATION  Patient Name: Travis Reese Birthdate: 08/02/1946 Sex: male Admission Date (Current Location): 01/19/2021  [facility] and [place] Number:  Herbalist and Address:  [facility], [place] [address] [place], [place] [address]      Provider Number: [id number]  Attending Physician Name and Address:  Albertine Patricia, MD  Relative Name and Phone Number:  Smith,Christie Daughter   [phone]    Current Level of Care: Hospital Recommended Level of Care: [place] Prior Approval Number:    Date Approved/Denied:   PASRR Number:    Discharge Plan: SNF    Current Diagnoses: Patient Active Problem List   Diagnosis Date Noted  . Pressure injury of skin 01/20/2021  . Depression   . History of CVA (cerebrovascular accident)   . UTI (urinary tract infection) 01/19/2021  . 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 Tremor   . Weakness 12/15/2020  . S/P aortic valve replacement with bioprosthetic valve 05/01/2018  . HTN (hypertension) 04/19/2018  . Chronic diastolic CHF (congestive heart failure) ([place]) 04/19/2018  . BPH (benign prostatic hyperplasia) 04/19/2018  . Anxiety 04/19/2018  . Cellulitis of face 10/15/2016  . Mixed hyperlipidemia 09/23/2016  . Other fatigue 07/23/2016  . Recurrent sinusitis 07/23/2016  . Other emphysema ([place]) 07/11/2016  . Restless leg syndrome 07/10/2016  . Plantar wart of left foot 01/05/2016  . Prolapsed internal hemorrhoids, grade 3 04/19/2015    Orientation RESPIRATION BLADDER Height & Weight     Self,Time,Situation,Place  Normal Incontinent Weight: 186 lb 15.9 oz (84.8 kg) Height:  6\' 2"  (188 cm)  BEHAVIORAL SYMPTOMS/MOOD NEUROLOGICAL BOWEL NUTRITION STATUS      Continent Diet (see discharge summary)  AMBULATORY STATUS COMMUNICATION OF NEEDS Skin   Limited Assist Verbally Normal                       Personal  Care Assistance Level of Assistance  Bathing,Feeding,Dressing Bathing Assistance: Limited assistance Feeding assistance: Independent Dressing Assistance: Maximum assistance     Functional Limitations Info  Sight,Hearing,Speech Sight Info: Adequate Hearing Info: Adequate Speech Info: Adequate    SPECIAL CARE FACTORS FREQUENCY  PT (By licensed PT),OT (By licensed OT)     PT Frequency: 5x week OT Frequency: 5x week            Contractures Contractures Info: Not present    Additional Factors Info  Code Status,Allergies Code Status Info: partial Allergies Info: NKA           Current Medications (01/23/2021):  This is the current hospital active medication list Current Facility-Administered Medications  Medication Dose Route Frequency Provider Last Rate Last Admin  . 0.9 %  sodium chloride infusion  250 mL Intravenous PRN Vianne Bulls, MD 10 mL/hr at 01/20/21 0616 250 mL at 01/20/21 0616  . acetaminophen (TYLENOL) tablet 650 mg  650 mg Oral Q6H PRN Opyd, Ilene Qua, MD   650 mg at 01/20/21 2227   Or  . acetaminophen (TYLENOL) suppository 650 mg  650 mg Rectal Q6H PRN Opyd, Ilene Qua, MD      . albuterol (VENTOLIN HFA) 108 (90 Base) MCG/ACT inhaler 2 puff  2 puff Inhalation Q6H PRN Opyd, Ilene Qua, MD      . aspirin EC tablet 325 mg  325 mg Oral Daily Opyd, Ilene Qua, MD   325 mg at 01/23/21 0902  .  bisacodyl (DULCOLAX) EC tablet 5 mg  5 mg Oral Daily PRN Opyd, Ilene Qua, MD      . carvedilol (COREG) tablet 25 mg  25 mg Oral BID WC Opyd, Ilene Qua, MD   25 mg at 01/23/21 0902  . ceFAZolin (ANCEF) IVPB 1 g/50 mL premix  1 g Intravenous Q8H Elgergawy, Silver Huguenin, MD 100 mL/hr at 01/23/21 0527 1 g at 01/23/21 0527  . celecoxib (CELEBREX) capsule 100 mg  100 mg Oral q morning Opyd, Ilene Qua, MD   100 mg at 01/23/21 0919  . clonazePAM (KLONOPIN) tablet 1 mg  1 mg Oral QHS Opyd, Ilene Qua, MD   1 mg at 01/22/21 2138  . cyanocobalamin ((VITAMIN B-12)) injection 1,000 mcg  1,000 mcg  Intramuscular Weekly Elgergawy, Silver Huguenin, MD   1,000 mcg at 01/23/21 0908  . DULoxetine (CYMBALTA) DR capsule 30 mg  30 mg Oral Daily Opyd, Ilene Qua, MD   30 mg at 01/23/21 0902  . enoxaparin (LOVENOX) injection 40 mg  40 mg Subcutaneous Daily Opyd, Ilene Qua, MD   40 mg at 01/23/21 0902  . feeding supplement (ENSURE ENLIVE / ENSURE PLUS) liquid 237 mL  237 mL Oral TID BM Elgergawy, Silver Huguenin, MD   237 mL at 01/23/21 0907  . folic acid (FOLVITE) tablet 1 mg  1 mg Oral Daily Opyd, Ilene Qua, MD   1 mg at 01/23/21 0902  . melatonin tablet 10 mg  10 mg Oral QHS Opyd, Ilene Qua, MD   10 mg at 01/22/21 2138  . montelukast (SINGULAIR) tablet 10 mg  10 mg Oral QHS Opyd, Ilene Qua, MD   10 mg at 01/22/21 2138  . multivitamin with minerals tablet 1 tablet  1 tablet Oral Daily Elgergawy, Silver Huguenin, MD   1 tablet at 01/23/21 0902  . ondansetron (ZOFRAN) tablet 4 mg  4 mg Oral Q6H PRN Opyd, Ilene Qua, MD       Or  . ondansetron (ZOFRAN) injection 4 mg  4 mg Intravenous Q6H PRN Opyd, Ilene Qua, MD      . primidone (MYSOLINE) tablet 50 mg  50 mg Oral QHS Opyd, Ilene Qua, MD   50 mg at 01/22/21 2138  . senna-docusate (Senokot-S) tablet 1 tablet  1 tablet Oral QHS PRN Opyd, Ilene Qua, MD      . spironolactone (ALDACTONE) tablet 12.5 mg  12.5 mg Oral Daily Opyd, Ilene Qua, MD   12.5 mg at 01/23/21 0902  . tamsulosin (FLOMAX) capsule 0.4 mg  0.4 mg Oral BID PC Opyd, Ilene Qua, MD   0.4 mg at 01/23/21 0902     Discharge Medications: Please see discharge summary for a list of discharge medications.  Relevant Imaging Results:  Relevant Lab Results:   Additional Information SS# [id number]. Pt is vaccinated for covid and boosted.  Joanne Chars, LCSW

## 2021-01-23 NOTE — Discharge Instructions (Signed)
Follow with Primary MD O'Buch, Greta, PA-C in 7 days   Get CBC, CMP,  checked  by Primary MD next visit.    Activity: As tolerated with Full fall precautions use walker/cane & assistance as needed   Disposition Home    Diet: Heart Healthy  , with feeding assistance and aspiration precautions.   On your next visit with your primary care physician please Get Medicines reviewed and adjusted.   Please request your Prim.MD to go over all Hospital Tests and Procedure/Radiological results at the follow up, please get all Hospital records sent to your Prim MD by signing hospital release before you go home.   If you experience worsening of your admission symptoms, develop shortness of breath, life threatening emergency, suicidal or homicidal thoughts you must seek medical attention immediately by calling 911 or calling your MD immediately  if symptoms less severe.  You Must read complete instructions/literature along with all the possible adverse reactions/side effects for all the Medicines you take and that have been prescribed to you. Take any new Medicines after you have completely understood and accpet all the possible adverse reactions/side effects.   Do not drive, operating heavy machinery, perform activities at heights, swimming or participation in water activities or provide baby sitting services if your were admitted for syncope or siezures until you have seen by Primary MD or a Neurologist and advised to do so again.  Do not drive when taking Pain medications.    Do not take more than prescribed Pain, Sleep and Anxiety Medications  Special Instructions: If you have smoked or chewed Tobacco  in the last 2 yrs please stop smoking, stop any regular Alcohol  and or any Recreational drug use.  Wear Seat belts while driving.   Please note  You were cared for by a hospitalist during your hospital stay. If you have any questions about your discharge medications or the care you received  while you were in the hospital after you are discharged, you can call the unit and asked to speak with the hospitalist on call if the hospitalist that took care of you is not available. Once you are discharged, your primary care physician will handle any further medical issues. Please note that NO REFILLS for any discharge medications will be authorized once you are discharged, as it is imperative that you return to your primary care physician (or establish a relationship with a primary care physician if you do not have one) for your aftercare needs so that they can reassess your need for medications and monitor your lab values.

## 2021-01-23 NOTE — TOC Progression Note (Signed)
Transition of Care [facility]) - Progression Note    Patient Details  Name: Travis Reese MRN: [id number] Date of Birth: [birth year]  [facility] CM/SW Contact  Joanne Chars, LCSW Phone Number: 01/23/2021, 3:57 PM  Clinical Narrative:   Pt has changed his mind while working with PT and is now requesting SNF placement, states they will find a way to cover the copay days.  Permission given to send referral out in the hub.  Pt first choice is [facility], would be willing to go to [facility] again if [facility] not available.    Expected Discharge Plan: [place] Barriers to Discharge: Barriers Resolved  Expected Discharge Plan and Services Expected Discharge Plan: [place] Choice: [place] arrangements for the past 2 months: Single Family Home Expected Discharge Date: 01/23/21               DME Arranged: N/A         HH Arranged: RN,PT,OT,Nurse's Aide [place]: [place] Date [place]: 01/23/21 Time [place]: 1552 Representative spoke with at [place]: [place] ([place]) Interventions    Readmission Risk Interventions Readmission Risk Prevention Plan 05/08/2018  Transportation Screening Complete  PCP or Specialist Appt within 5-7 Days Complete  Home Care Screening Complete  Medication Review (RN CM) Complete  Some recent data might be hidden

## 2021-01-23 NOTE — TOC Progression Note (Signed)
Transition of Care [facility]) - Progression Note    Patient Details  Name: MARVEL MCPHILLIPS MRN: [id number] Date of Birth: Apr 04, 1946  [facility] of [facility]) CM/SW Contact  Joanne Chars, LCSW Phone Number: 01/23/2021, 8:28 AM  Clinical Narrative:    CSW received message from Theda Sers from last night.  Attempted to return call, LM CSW spoke with [facility] who confirmed that they are providing PT/OT/RN/HH aide  Theda Sers called back, is aware of the financial issues.  Michela Pitcher it can be challenging but she is in agreement with pt coming home.  She asked if pt has been able to get up and walk and CSW relayed note that pt had been up and walking with PT on 5/22.    Expected Discharge Plan: [place] Barriers to Discharge: Continued Medical Work up  Expected Discharge Plan and Services Expected Discharge Plan: [place] Choice: [place] arrangements for the past 2 months: Single Family Home                                       Social Determinants of Health (SDOH) Interventions    Readmission Risk Interventions Readmission Risk Prevention Plan 05/08/2018  Transportation Screening Complete  PCP or Specialist Appt within 5-7 Days Complete  Home Care Screening Complete  Medication Review (RN CM) Complete  Some recent data might be hidden

## 2021-01-23 NOTE — TOC Transition Note (Signed)
Transition of Care [facility]) - CM/SW Discharge Note   Patient Details  Name: Travis Reese MRN: [id number] Date of Birth: April 25, 1946  Transition of Care [facility]) CM/SW Contact:  Joanne Chars, LCSW Phone Number: 01/23/2021, 11:08 AM   Clinical Narrative:   Pt discharging home with [facility] resuming services.  No DME needs.  CSW spoke with daughter Travis Reese who will provide transport home.      Final next level of care: [place] Barriers to Discharge: Barriers Resolved   Patient Goals and CMS Choice Patient states their goals for this hospitalization and ongoing recovery are:: pt did not have one CMS Medicare.gov Compare Post Acute Care list provided to::  (NA-wants to continue with [facility])    Discharge Placement                  Name of family member notified: daughter Travis Reese Patient and family notified of of transfer: 01/23/21  Discharge Plan and Services     Post Acute Care Choice: Home Health          DME Arranged: N/A         HH Arranged: RN,PT,OT,Nurse's Aide [place] Agency: [place] Date [place]: 01/23/21 Time [place]: 0768 Representative spoke with at [place]: [place]) Interventions     Readmission Risk Interventions Readmission Risk Prevention Plan 05/08/2018  Transportation Screening Complete  PCP or Specialist Appt within 5-7 Days Complete  Home Care Screening Complete  Medication Review (RN CM) Complete  Some recent data might be hidden

## 2021-01-23 NOTE — Progress Notes (Signed)
   Cardiology asked to place order for 30 day monitor to evaluate CVA.  Patient to follow-up with Dr. Agustin Cree afterwards to discuss results.   Abigail Butts, PA-C 01/23/21; 5:15 PM

## 2021-01-23 NOTE — Consult Note (Signed)
   [facility] CM Inpatient Consult   01/23/2021  Travis Reese [birth year] [id number]  [place] [facility] [[facility]] Patient: [name]   Patient screened for less than 30 days readmission hospitalization with noted stay at [facility] in [employer].   Review of patient's medical record reveals patient is for home with home health. Primary Care Provider listed as Sheppard Evens, [facility] does the Transition of Care follow up calls and appointment.  [facility] health is the home health agency listed as from post SNF.  Plan:  Continue to follow progress and disposition to assess for post hospital care management needs.    For questions contact:   Natividad Brood, RN BSN [employer] [facility] Liaison  [phone] business mobile phone Toll free office [phone]  Fax number: [phone] [email or web address]

## 2021-01-23 NOTE — Plan of Care (Signed)
  Problem: Education: Goal: Knowledge of General Education information will improve Description: Including pain rating scale, medication(s)/side effects and non-pharmacologic comfort measures Outcome: Adequate for Discharge   

## 2021-01-23 NOTE — Progress Notes (Signed)
Discharge instructions (including medications) discussed with and copy provided to patient/caregiver 

## 2021-01-23 NOTE — Care Management Important Message (Signed)
Important Message  Patient Details  Name: Travis Reese MRN: [id number] Date of Birth: [birth year]   Medicare Important Message Given:  Yes     Hatem Cull [address] 01/23/2021, 3:59 PM

## 2021-01-23 NOTE — Discharge Summary (Addendum)
Physician Discharge Summary  Travis Reese ZOX:[id number] DOB: [birth year] DOA: 01/19/2021  PCP: Travis Limbo, PA-C  Admit date: 01/19/2021 Discharge date: 01/24/2021  Admitted From: Home Disposition:  SNF   Recommendations for Outpatient Follow-up:  1. Follow up with PCP in 1-2 weeks 2. Please obtain BMP/CBC in one week 3. Please continue to monitor B12 level 4. Patient will have 30 days cardiac event monitor to be arranged by [facility] Equipment/Devices:HH  Discharge Condition:Stable CODE STATUS:FULL Diet recommendation: Heart Healthy   Brief/Interim Summary:  Travis Medina Flowersis a 75 y.o.malewith medical history significant forchronic diastolic CHF, mild nonobstructive CAD, depression, anxiety, drug-induced parkinson,and chronic diastolic CHF, now presenting to the emergency department for evaluation of dysuria, fever, fatigue, and general weakness. Patient requires assistance with bathing and getting out of bed at baseline but has been feeling more fatigued and generally weak for approximately a week or more and has had dysuria.   Is any focal deficits, his work-up was significant for UTI, admitted for further work-up.    UTI -Presents with dysuria, fever, and fatigue -He is not septic on admission -Treated with Rocephin, urine culture growing E. coli, pansensitive, narrowed to cefazolin, he will be discharged on Keflex for total of 7 days treatment  History of CVA -During recent hospitalization, MRI brain 4/14 significant for subacute CVA -Patient appears to be forgetful with very minimal confusion, previous work-up indicated very low B12 level at 107, it will be repleted with IM supplements -Seen by his outpatient neurologist 5/19, with evidence of subacute CVA on MRI done 4/14, patient supposed to have 2D echo and carotid Doppler as an outpatient, work-up has been pursued as an inpatient as discussed with neurology by phone, repeat MRI brain/MRA  head and neck, there is no large vessel occlusion, no acute CVA,  2D echo cannot rule PFO, I have obtained venous Dopplers which were negative for DVT, he was monitored on telemetry, no evidence of A. fib during hospital stay, I have arranged for 30 days event monitor as an outpatient.   B12 deficiency -Starting on supplements, significant other  educated about IM injections.  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    Activity: As tolerated with Full fall precautions use walker/cane & assistance as needed   Disposition Home    Diet: Heart Healthy  , with feeding assistance and aspiration precautions.   On your next visit with your primary care physician please Get Medicines reviewed and adjusted.   Please request your Prim.MD to go over all Hospital Tests and Procedure/Radiological results at the follow up, please get all Hospital records sent to your Prim MD  by signing hospital release before you go home.   If you experience worsening of your admission symptoms, develop shortness of breath, life threatening  emergency, suicidal or homicidal thoughts you must seek medical attention immediately by calling 911 or calling your MD immediately  if symptoms less severe.  You Must read complete instructions/literature along with all the possible adverse reactions/side effects for all the Medicines you take and that have been prescribed to you. Take any new Medicines after you have completely understood and accpet all the possible adverse reactions/side effects.   Do not drive, operating heavy machinery, perform activities at heights, swimming or participation in water activities or provide baby sitting services if your were admitted for syncope or siezures until you have seen by Primary MD or a Neurologist and advised to do so again.  Do not drive when taking Pain medications.    Do not take more than prescribed Pain, Sleep and Anxiety Medications  Special Instructions: If you have smoked or chewed Tobacco  in the last 2 yrs please stop smoking, stop any regular Alcohol  and or any Recreational drug use.  Wear Seat belts while driving.   Please note  You were cared for by a hospitalist during your hospital stay. If you have any questions about your discharge medications or the care you received while you were in the hospital after you are discharged, you can call the unit and asked to speak with the hospitalist on call if the hospitalist that took care of you is not available. Once you are discharged, your primary care physician will handle any further medical issues. Please note that NO REFILLS for any discharge medications will be authorized once you are discharged, as it is imperative that you return to your primary care physician (or establish a relationship with a primary care physician if you do not have one) for your aftercare needs so that they can reassess your need for medications and monitor your lab values.   Increase activity slowly   Complete by: As directed    No wound care   Complete by: As  directed      Allergies as of 01/23/2021   No Known Allergies     Medication List    STOP taking these medications   tadalafil 5 MG tablet Commonly known as: CIALIS     TAKE these medications   acetaminophen 325 MG tablet Commonly known as: TYLENOL Take 2 tablets (650 mg total) by mouth every 6 (six) hours as needed for mild pain (or Fever >/= 101). What changed:   medication strength  how much to take  when to take this  reasons to take this   albuterol 108 (90 Base) MCG/ACT inhaler Commonly known as: VENTOLIN HFA Inhale 2 puffs into the lungs every 6 (six) hours as needed for wheezing or shortness of breath.   aspirin 325 MG EC tablet Take 1 tablet (325 mg total) by mouth daily.   carvedilol 25 MG tablet Commonly known as: COREG TAKE 1 TABLET BY MOUTH  TWICE DAILY WITH MEALS What changed:   how much to take  how to take this  when to take this  additional instructions   celecoxib 100 MG capsule Commonly known as: CELEBREX Take 100 mg by mouth every morning.   cephALEXin 500 MG capsule Commonly known as: KEFLEX Take 1 capsule (500 mg total) by mouth 4 (four) times daily for 4 days.   clonazePAM 1 MG tablet Commonly known as: KLONOPIN Take 1  tablet (1 mg total) by mouth at bedtime.   cyanocobalamin 1000 MCG/ML injection Commonly known as: (VITAMIN B-12) Inject 1 mL (1,000 mcg total) into the muscle every 30 (thirty) days for 10 doses.   DULoxetine 30 MG capsule Commonly known as: CYMBALTA Take 30 mg by mouth daily.   feeding supplement Liqd Take 237 mLs by mouth 3 (three) times daily between meals.   fluticasone 50 MCG/ACT nasal spray Commonly known as: FLONASE Place 1 spray into both nostrils at bedtime.   folic acid 1 MG tablet Commonly known as: FOLVITE Take 1 tablet (1 mg total) by mouth daily.   Krill Oil 1000 MG Caps Take 1,000 mg by mouth at bedtime.   Melatonin 10 MG Caps Take 10 mg by mouth at bedtime.   montelukast 10 MG  tablet Commonly known as: SINGULAIR Take 10 mg by mouth at bedtime.   primidone 50 MG tablet Commonly known as: MYSOLINE Take 1 tablet (50 mg total) by mouth at bedtime.   spironolactone 25 MG tablet Commonly known as: ALDACTONE Take 0.5 tablets (12.5 mg total) by mouth daily. NEEDS AN APPOINTMENT FOR FURTHER REFILLS What changed: additional instructions   SYRINGE 3CC/18GX1-1/2" 18G X 1-1/2" 3 ML Misc Use for B12 IM injections   tamsulosin 0.4 MG Caps capsule Commonly known as: FLOMAX Take 0.4 mg by mouth 2 (two) times daily after a meal.   Vitamin D (Ergocalciferol) 1.25 MG (50000 UNIT) Caps capsule Commonly known as: DRISDOL Take 50,000 Units by mouth every Monday.   Zinc 50 MG Tabs Take 50 mg by mouth at bedtime.       Follow-up Information    Reese, Greta, PA-C Follow up in 1 week(s).   Specialty: Internal Medicine Contact information: [address] [place] [address] [facility].   Specialty: [facility] Why: [facility] will contact you to schedule your first home visit.  Contact information: [address] [address] [phone]              No Known Allergies  Consultations: None  Procedures/Studies: MR ANGIO HEAD WO CONTRAST  Result Date: 01/22/2021 CLINICAL DATA:  Worsening confusion with possible stroke on prior study EXAM: MRI HEAD WITHOUT CONTRAST MRA HEAD WITHOUT CONTRAST MRA NECK WITHOUT AND WITH CONTRAST TECHNIQUE: Multiplanar, multi-echo pulse sequences of the brain and surrounding structures were acquired without intravenous contrast. Angiographic images of the Circle of Willis were acquired using MRA technique without intravenous contrast. Angiographic images of the neck were acquired using MRA technique without and with intravenous contrast. Carotid stenosis measurements (when applicable) are obtained utilizing NASCET criteria, using the distal  internal carotid diameter as the denominator. CONTRAST:  8.60mL GADAVIST GADOBUTROL 1 MMOL/ML IV SOLN COMPARISON:  MRI brain 12/14/2020 FINDINGS: MRI HEAD Brain: There is no acute infarction or intracranial hemorrhage. Small foci of diffusion hyperintensity on the prior study are no longer present. There is no intracranial mass, mass effect, or edema. Prominence of the ventricles and sulci reflects stable parenchymal volume loss. Patchy and confluent areas of T2 hyperintensity in the supratentorial white matter likely reflects stable chronic microvascular ischemic changes. Small chronic bilateral cerebellar infarcts. Minimal punctate foci of susceptibility hypointensity subcortical cerebral white matter likely reflecting chronic microhemorrhages. There is no hydrocephalus or extra-axial fluid collection. Vascular: Major vessel flow voids at the skull base are preserved. Skull and upper cervical spine: Normal marrow signal is preserved.  Sinuses/Orbits: Trace paranasal sinus mucosal thickening. No acute orbital finding. Other: Sella is unremarkable.  Mastoid air cells are clear. MRA HEAD Intracranial internal carotid arteries are patent. Middle and anterior cerebral arteries are patent. Intracranial vertebral arteries, basilar artery, posterior cerebral arteries are patent. There is no significant stenosis or aneurysm. MRA NECK Great vessel origins are patent. There is no proximal subclavian artery stenosis. Common, internal, and external carotid arteries are patent. Vertebral arteries are patent. Left vertebral artery is dominant. IMPRESSION: No acute infarction, hemorrhage, or mass. Expected evolution of small infarcts on prior study. No large vessel occlusion, hemodynamically significant stenosis, or evidence of dissection. Electronically Signed   By: Macy Mis M.D.   On: 01/22/2021 16:06   MR ANGIO NECK W WO CONTRAST  Result Date: 01/22/2021 CLINICAL DATA:  Worsening confusion with possible stroke on prior  study EXAM: MRI HEAD WITHOUT CONTRAST MRA HEAD WITHOUT CONTRAST MRA NECK WITHOUT AND WITH CONTRAST TECHNIQUE: Multiplanar, multi-echo pulse sequences of the brain and surrounding structures were acquired without intravenous contrast. Angiographic images of the Circle of Willis were acquired using MRA technique without intravenous contrast. Angiographic images of the neck were acquired using MRA technique without and with intravenous contrast. Carotid stenosis measurements (when applicable) are obtained utilizing NASCET criteria, using the distal internal carotid diameter as the denominator. CONTRAST:  8.52mL GADAVIST GADOBUTROL 1 MMOL/ML IV SOLN COMPARISON:  MRI brain 12/14/2020 FINDINGS: MRI HEAD Brain: There is no acute infarction or intracranial hemorrhage. Small foci of diffusion hyperintensity on the prior study are no longer present. There is no intracranial mass, mass effect, or edema. Prominence of the ventricles and sulci reflects stable parenchymal volume loss. Patchy and confluent areas of T2 hyperintensity in the supratentorial white matter likely reflects stable chronic microvascular ischemic changes. Small chronic bilateral cerebellar infarcts. Minimal punctate foci of susceptibility hypointensity subcortical cerebral white matter likely reflecting chronic microhemorrhages. There is no hydrocephalus or extra-axial fluid collection. Vascular: Major vessel flow voids at the skull base are preserved. Skull and upper cervical spine: Normal marrow signal is preserved. Sinuses/Orbits: Trace paranasal sinus mucosal thickening. No acute orbital finding. Other: Sella is unremarkable.  Mastoid air cells are clear. MRA HEAD Intracranial internal carotid arteries are patent. Middle and anterior cerebral arteries are patent. Intracranial vertebral arteries, basilar artery, posterior cerebral arteries are patent. There is no significant stenosis or aneurysm. MRA NECK Great vessel origins are patent. There is no  proximal subclavian artery stenosis. Common, internal, and external carotid arteries are patent. Vertebral arteries are patent. Left vertebral artery is dominant. IMPRESSION: No acute infarction, hemorrhage, or mass. Expected evolution of small infarcts on prior study. No large vessel occlusion, hemodynamically significant stenosis, or evidence of dissection. Electronically Signed   By: Macy Mis M.D.   On: 01/22/2021 16:06   MR BRAIN WO CONTRAST  Result Date: 01/22/2021 CLINICAL DATA:  Worsening confusion with possible stroke on prior study EXAM: MRI HEAD WITHOUT CONTRAST MRA HEAD WITHOUT CONTRAST MRA NECK WITHOUT AND WITH CONTRAST TECHNIQUE: Multiplanar, multi-echo pulse sequences of the brain and surrounding structures were acquired without intravenous contrast. Angiographic images of the Circle of Willis were acquired using MRA technique without intravenous contrast. Angiographic images of the neck were acquired using MRA technique without and with intravenous contrast. Carotid stenosis measurements (when applicable) are obtained utilizing NASCET criteria, using the distal internal carotid diameter as the denominator. CONTRAST:  8.43mL GADAVIST GADOBUTROL 1 MMOL/ML IV SOLN COMPARISON:  MRI brain 12/14/2020 FINDINGS: MRI HEAD Brain: There is no acute  infarction or intracranial hemorrhage. Small foci of diffusion hyperintensity on the prior study are no longer present. There is no intracranial mass, mass effect, or edema. Prominence of the ventricles and sulci reflects stable parenchymal volume loss. Patchy and confluent areas of T2 hyperintensity in the supratentorial white matter likely reflects stable chronic microvascular ischemic changes. Small chronic bilateral cerebellar infarcts. Minimal punctate foci of susceptibility hypointensity subcortical cerebral white matter likely reflecting chronic microhemorrhages. There is no hydrocephalus or extra-axial fluid collection. Vascular: Major vessel flow  voids at the skull base are preserved. Skull and upper cervical spine: Normal marrow signal is preserved. Sinuses/Orbits: Trace paranasal sinus mucosal thickening. No acute orbital finding. Other: Sella is unremarkable.  Mastoid air cells are clear. MRA HEAD Intracranial internal carotid arteries are patent. Middle and anterior cerebral arteries are patent. Intracranial vertebral arteries, basilar artery, posterior cerebral arteries are patent. There is no significant stenosis or aneurysm. MRA NECK Great vessel origins are patent. There is no proximal subclavian artery stenosis. Common, internal, and external carotid arteries are patent. Vertebral arteries are patent. Left vertebral artery is dominant. IMPRESSION: No acute infarction, hemorrhage, or mass. Expected evolution of small infarcts on prior study. No large vessel occlusion, hemodynamically significant stenosis, or evidence of dissection. Electronically Signed   By: Macy Mis M.D.   On: 01/22/2021 16:06   ECHOCARDIOGRAM COMPLETE BUBBLE STUDY  Result Date: 01/21/2021    ECHOCARDIOGRAM REPORT   Patient Name:   Travis Reese Date of Exam: 01/21/2021 Medical Rec #:  [id number]       Height:       74.0 in Accession #:    [id number]      Weight:       187.0 lb Date of Birth:  27-Sep-1945      BSA:          2.112 m Patient Age:    47 years        BP:           117/76 mmHg Patient Gender: M               HR:           72 bpm. Exam Location:  Inpatient Procedure: 2D Echo, Cardiac Doppler, Color Doppler and Saline Contrast Bubble            Study Indications:    Stroke  History:        Patient has prior history of Echocardiogram examinations, most                 recent 04/08/2019. CHF; Risk Factors:Hypertension.  Sonographer:    Cammy Brochure Referring Phys: 67 Newman  1. Left ventricular ejection fraction, by estimation, is 55 to 60%. The left ventricle has normal function. The left ventricle has no regional wall motion  abnormalities. Left ventricular diastolic parameters are consistent with Grade I diastolic dysfunction (impaired relaxation).  2. Right ventricular systolic function is normal. The right ventricular size is normal.  3. Left atrial size was mildly dilated.  4. Saline microbubbles were seen in the left ventricle, however, I cannot determine if it was within 5 beats - this was after valsalva. cannot exclude PFO vs late intrapulmonary shunting.  5. The mitral valve is grossly normal. No evidence of mitral valve regurgitation.  6. The aortic valve is tricuspid. Aortic valve regurgitation is not visualized. Conclusion(s)/Recommendation(s): Cannot exclude ASD/PFO. Consider transesophageal echocardiogram, if clinically indicated. FINDINGS  Left Ventricle: Left ventricular ejection fraction,  by estimation, is 55 to 60%. The left ventricle has normal function. The left ventricle has no regional wall motion abnormalities. The left ventricular internal cavity size was normal in size. There is  no left ventricular hypertrophy. Left ventricular diastolic parameters are consistent with Grade I diastolic dysfunction (impaired relaxation). Indeterminate filling pressures. Right Ventricle: The right ventricular size is normal. No increase in right ventricular wall thickness. Right ventricular systolic function is normal. Left Atrium: Left atrial size was mildly dilated. Right Atrium: Right atrial size was normal in size. Pericardium: There is no evidence of pericardial effusion. Mitral Valve: The mitral valve is grossly normal. No evidence of mitral valve regurgitation. Tricuspid Valve: The tricuspid valve is grossly normal. Tricuspid valve regurgitation is trivial. Aortic Valve: The aortic valve is tricuspid. Aortic valve regurgitation is not visualized. Aortic valve mean gradient measures 8.0 mmHg. Aortic valve peak gradient measures 15.2 mmHg. Aortic valve area, by VTI measures 2.05 cm. 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 Pulmonic Valve: The pulmonic valve was normal in structure. Pulmonic valve regurgitation is not visualized. Aorta: The aortic root and ascending aorta are structurally normal, with no evidence of dilitation. IAS/Shunts: Cannot exclude PFO vs late intrapulmonary shunting. Agitated saline contrast was given intravenously to evaluate for intracardiac shunting.  LEFT VENTRICLE PLAX 2D LVIDd:         4.10 cm  Diastology LVIDs:         3.00 cm  LV e' medial:    5.22 cm/s LV PW:         1.20 cm  LV E/e' medial:  11.2 LV IVS:        0.90 cm  LV e' lateral:   11.40 cm/s LVOT diam:     2.40 cm  LV E/e' lateral: 5.1 LV SV:         75 LV SV Index:   35 LVOT Area:     4.52 cm  RIGHT VENTRICLE            IVC RV S prime:     8.92 cm/s  IVC diam: 0.90 cm LEFT ATRIUM             Index       RIGHT ATRIUM           Index LA diam:        2.80 cm 1.33 cm/m  RA Area:     21.00 cm LA Vol (A2C):   71.7 ml 33.95 ml/m RA Volume:   72.10 ml  34.13 ml/m LA Vol (A4C):   76.2 ml 36.08 ml/m LA Biplane Vol: 77.5 ml 36.69 ml/m  AORTIC VALVE AV Area (Vmax):    1.97 cm AV Area (Vmean):   1.91 cm AV Area (VTI):     2.05 cm AV Vmax:           195.00 cm/s AV Vmean:          130.000 cm/s AV VTI:            0.365 m AV Peak Grad:      15.2 mmHg AV Mean Grad:      8.0 mmHg LVOT Vmax:         84.80 cm/s LVOT Vmean:        54.800 cm/s LVOT VTI:          0.165 m LVOT/AV VTI ratio: 0.45  AORTA Ao Root diam: 2.90 cm MITRAL VALVE MV Area (PHT): 3.03 cm  SHUNTS MV Decel Time: 250 msec    Systemic VTI:  0.16 m MV E velocity: 58.30 cm/s  Systemic Diam: 2.40 cm MV A velocity: 95.50 cm/s MV E/A ratio:  0.61 [name] Bishop MD Electronically signed by [name] Bishop MD Signature Date/Time: 01/21/2021/1:08:51 PM    Final    VAS [place] LOWER EXTREMITY VENOUS (DVT)  Result Date: 01/22/2021  Lower Venous DVT Study Patient Name:  Travis Reese  Date of Exam:   01/22/2021 Medical Rec #: [id number]        Accession #:    [id number] Date of Birth:  June 20, 1946       Patient Gender: M Patient Age:   74Y Exam Location:  [facility] Procedure:      VAS [place] LOWER EXTREMITY VENOUS (DVT) Referring Phys: [id number] --------------------------------------------------------------------------------  Indications: Stroke.  Comparison Study: no prior Performing Technologist: Abram Sander RVS  Examination Guidelines: A complete evaluation includes B-mode imaging, spectral Doppler, color Doppler, and power Doppler as needed of all accessible portions of each vessel. Bilateral testing is considered an integral part of a complete examination. Limited examinations for reoccurring indications may be performed as noted. The reflux portion of the exam is performed with the patient in reverse Trendelenburg.  +---------+---------------+---------+-----------+----------+--------------+ RIGHT    CompressibilityPhasicitySpontaneityPropertiesThrombus Aging +---------+---------------+---------+-----------+----------+--------------+ CFV      Full           Yes      Yes                                 +---------+---------------+---------+-----------+----------+--------------+ SFJ      Full                                                        +---------+---------------+---------+-----------+----------+--------------+ FV Prox  Full                                                        +---------+---------------+---------+-----------+----------+--------------+ FV Mid   Full                                                        +---------+---------------+---------+-----------+----------+--------------+ FV DistalFull                                                        +---------+---------------+---------+-----------+----------+--------------+ PFV      Full                                                        +---------+---------------+---------+-----------+----------+--------------+ POP      Full  Yes       Yes                                 +---------+---------------+---------+-----------+----------+--------------+ PTV      Full                                                        +---------+---------------+---------+-----------+----------+--------------+ PERO     Full                                                        +---------+---------------+---------+-----------+----------+--------------+   +---------+---------------+---------+-----------+----------+--------------+ LEFT     CompressibilityPhasicitySpontaneityPropertiesThrombus Aging +---------+---------------+---------+-----------+----------+--------------+ CFV      Full           Yes      Yes                                 +---------+---------------+---------+-----------+----------+--------------+ SFJ      Full                                                        +---------+---------------+---------+-----------+----------+--------------+ FV Prox  Full                                                        +---------+---------------+---------+-----------+----------+--------------+ FV Mid   Full                                                        +---------+---------------+---------+-----------+----------+--------------+ FV DistalFull                                                        +---------+---------------+---------+-----------+----------+--------------+ PFV      Full                                                        +---------+---------------+---------+-----------+----------+--------------+ POP      Full           Yes      Yes                                 +---------+---------------+---------+-----------+----------+--------------+ PTV  Full                                                        +---------+---------------+---------+-----------+----------+--------------+ PERO     Full                                                         +---------+---------------+---------+-----------+----------+--------------+     Summary: BILATERAL: - No evidence of deep vein thrombosis seen in the lower extremities, bilaterally. -No evidence of popliteal cyst, bilaterally.   *See table(s) above for measurements and observations. Electronically signed by Monica Martinez MD on 01/22/2021 at 3:35:53 PM.    Final      Subjective:  Patient denies any complaints today, no new focal deficits, no nausea, no vomiting, no further dysuria Discharge Exam: Vitals:   01/23/21 0527 01/23/21 0825  BP: (!) 153/93 (!) 144/89  Pulse: 72 81  Resp: 16 18  Temp: 98.4 F (36.9 C) 98.4 F (36.9 C)  SpO2: 95% 96%   Vitals:   01/22/21 1500 01/22/21 1935 01/23/21 0527 01/23/21 0825  BP: (!) 161/101 126/78 (!) 153/93 (!) 144/89  Pulse: 78 74 72 81  Resp: 18 20 16 18   Temp: 98.2 F (36.8 C) 98.5 F (36.9 C) 98.4 F (36.9 C) 98.4 F (36.9 C)  TempSrc: Oral Oral Oral Oral  SpO2:  95% 95% 96%  Weight:      Height:        General: Pt is alert, awake, extremely frail and deconditioned, not in acute distress Cardiovascular: RRR, S1/S2 +, no rubs, no gallops Respiratory: CTA bilaterally, no wheezing, no rhonchi Abdominal: Soft, NT, ND, bowel sounds + Extremities: no edema, no cyanosis    The results of significant diagnostics from this hospitalization (including imaging, microbiology, ancillary and laboratory) are listed below for reference.     Microbiology: Recent Results (from the past 240 hour(s))  Urine culture     Status: Abnormal   Collection Time: 01/19/21 12:42 PM   Specimen: Urine, Random  Result Value Ref Range Status   Specimen Description URINE, RANDOM  Final   Special Requests   Final    NONE Performed at [facility], [address], [place], [place] [address]    Culture >=100,000 COLONIES/mL ESCHERICHIA COLI (A)  Final   Report Status 01/22/2021 FINAL  Final   Organism ID, Bacteria ESCHERICHIA COLI (A)  Final       Susceptibility   Escherichia coli - MIC*    AMPICILLIN 8 SENSITIVE Sensitive     CEFAZOLIN <=4 SENSITIVE Sensitive     CEFEPIME <=0.12 SENSITIVE Sensitive     CEFTRIAXONE <=0.25 SENSITIVE Sensitive     CIPROFLOXACIN <=0.25 SENSITIVE Sensitive     GENTAMICIN <=1 SENSITIVE Sensitive     IMIPENEM <=0.25 SENSITIVE Sensitive     NITROFURANTOIN <=16 SENSITIVE Sensitive     TRIMETH/SULFA <=20 SENSITIVE Sensitive     AMPICILLIN/SULBACTAM <=2 SENSITIVE Sensitive     PIP/TAZO <=4 SENSITIVE Sensitive     * >=100,000 COLONIES/mL ESCHERICHIA COLI  SARS CORONAVIRUS 2 (TAT 6-24 HRS) Nasopharyngeal Nasopharyngeal Swab     Status: None   Collection Time: 01/19/21  10:51 PM   Specimen: Nasopharyngeal Swab  Result Value Ref Range Status   SARS Coronavirus 2 NEGATIVE NEGATIVE Final    Comment: (NOTE) SARS-CoV-2 target nucleic acids are NOT DETECTED.  The SARS-CoV-2 RNA is generally detectable in upper and lower respiratory specimens during the acute phase of infection. Negative results do not preclude SARS-CoV-2 infection, do not rule out co-infections with other pathogens, and should not be used as the sole basis for treatment or other patient management decisions. Negative results must be combined with clinical observations, patient history, and epidemiological information. The expected result is Negative.  Fact Sheet for Patients: SugarRoll.be  Fact Sheet for Healthcare Providers: https://www.woods-mathews.com/  This test is not yet approved or cleared by the [place] FDA and  has been authorized for detection and/or diagnosis of SARS-CoV-2 by FDA under an Emergency Use Authorization (EUA). This EUA will remain  in effect (meaning this test can be used) for the duration of the COVID-19 declaration under Se ction 564(b)(1) of the Act, 21 U.S.C. section 360bbb-3(b)(1), unless the authorization is terminated or revoked sooner.  Performed at [facility], [place] [address] [place], [place] [address]      Labs: BNP (last 3 results) No results for input(s): BNP in the last 8760 hours. Basic Metabolic Panel: Recent Labs  Lab 01/19/21 1242 01/20/21 0332 01/21/21 0034 01/22/21 0415  NA 134* 134* 133* 131*  K 4.0 3.9 3.7 4.0  CL 101 100 98 98  CO2 24 24 26 22   GLUCOSE 122* 130* 122* 94  BUN 13 15 23 14   CREATININE 0.79 0.75 0.78 0.63  CALCIUM 9.0 9.0 8.7* 8.5*  MG  --   --   --  1.8  PHOS  --   --   --  3.4   Liver Function Tests: Recent Labs  Lab 01/19/21 1242 01/20/21 0332  AST 15 15  ALT 10 10  ALKPHOS 60 59  BILITOT 1.3* 1.3*  PROT 6.4* 6.1*  ALBUMIN 3.1* 2.9*   Recent Labs  Lab 01/19/21 1242  LIPASE 28   No results for input(s): AMMONIA in the last 168 hours. CBC: Recent Labs  Lab 01/19/21 1242 01/20/21 0332 01/21/21 0034 01/22/21 0415  WBC 10.1 6.1 4.6 5.8  NEUTROABS 8.7*  --   --   --   HGB 12.5* 12.2* 12.1* 12.7*  HCT 37.1* 35.4* 35.2* 35.7*  MCV 102.5* 100.0 100.0 96.5  PLT 179 163 164 158   Cardiac Enzymes: No results for input(s): CKTOTAL, CKMB, CKMBINDEX, TROPONINI in the last 168 hours. BNP: Invalid input(s): POCBNP CBG: No results for input(s): GLUCAP in the last 168 hours. D-Dimer No results for input(s): DDIMER in the last 72 hours. Hgb A1c No results for input(s): HGBA1C in the last 72 hours. Lipid Profile No results for input(s): CHOL, HDL, LDLCALC, TRIG, CHOLHDL, LDLDIRECT in the last 72 hours. Thyroid function studies Recent Labs    01/21/21 0034  TSH 2.294   Anemia work up No results for input(s): VITAMINB12, FOLATE, FERRITIN, TIBC, IRON, RETICCTPCT in the last 72 hours. Urinalysis    Component Value Date/Time   COLORURINE AMBER (A) 01/19/2021 2043   APPEARANCEUR HAZY (A) 01/19/2021 2043   LABSPEC 1.028 01/19/2021 2043   PHURINE 5.0 01/19/2021 2043   GLUCOSEU NEGATIVE 01/19/2021 2043   HGBUR MODERATE (A) 01/19/2021 2043   BILIRUBINUR NEGATIVE 01/19/2021  2043   KETONESUR 20 (A) 01/19/2021 2043   PROTEINUR 100 (A) 01/19/2021 2043   NITRITE POSITIVE (A)  01/19/2021 2043   LEUKOCYTESUR SMALL (A) 01/19/2021 2043   Sepsis Labs Invalid input(s): PROCALCITONIN,  WBC,  LACTICIDVEN Microbiology Recent Results (from the past 240 hour(s))  Urine culture     Status: Abnormal   Collection Time: 01/19/21 12:42 PM   Specimen: Urine, Random  Result Value Ref Range Status   Specimen Description URINE, RANDOM  Final   Special Requests   Final    NONE Performed at [facility], [address] [place], [place] [address]    Culture >=100,000 COLONIES/mL ESCHERICHIA COLI (A)  Final   Report Status 01/22/2021 FINAL  Final   Organism ID, Bacteria ESCHERICHIA COLI (A)  Final      Susceptibility   Escherichia coli - MIC*    AMPICILLIN 8 SENSITIVE Sensitive     CEFAZOLIN <=4 SENSITIVE Sensitive     CEFEPIME <=0.12 SENSITIVE Sensitive     CEFTRIAXONE <=0.25 SENSITIVE Sensitive     CIPROFLOXACIN <=0.25 SENSITIVE Sensitive     GENTAMICIN <=1 SENSITIVE Sensitive     IMIPENEM <=0.25 SENSITIVE Sensitive     NITROFURANTOIN <=16 SENSITIVE Sensitive     TRIMETH/SULFA <=20 SENSITIVE Sensitive     AMPICILLIN/SULBACTAM <=2 SENSITIVE Sensitive     PIP/TAZO <=4 SENSITIVE Sensitive     * >=100,000 COLONIES/mL ESCHERICHIA COLI  SARS CORONAVIRUS 2 (TAT 6-24 HRS) Nasopharyngeal Nasopharyngeal Swab     Status: None   Collection Time: 01/19/21 10:51 PM   Specimen: Nasopharyngeal Swab  Result Value Ref Range Status   SARS Coronavirus 2 NEGATIVE NEGATIVE Final    Comment: (NOTE) SARS-CoV-2 target nucleic acids are NOT DETECTED.  The SARS-CoV-2 RNA is generally detectable in upper and lower respiratory specimens during the acute phase of infection. Negative results do not preclude SARS-CoV-2 infection, do not rule out co-infections with other pathogens, and should not be used as the sole basis for treatment or other patient management decisions. Negative  results must be combined with clinical observations, patient history, and epidemiological information. The expected result is Negative.  Fact Sheet for Patients: SugarRoll.be  Fact Sheet for Healthcare Providers: https://www.woods-mathews.com/  This test is not yet approved or cleared by the [place] FDA and  has been authorized for detection and/or diagnosis of SARS-CoV-2 by FDA under an Emergency Use Authorization (EUA). This EUA will remain  in effect (meaning this test can be used) for the duration of the COVID-19 declaration under Se ction 564(b)(1) of the Act, 21 U.S.C. section 360bbb-3(b)(1), unless the authorization is terminated or revoked sooner.  Performed at [facility], [place] [address], [place], [place] [address]      Time coordinating discharge: Over 30 minutes  SIGNED:   Phillips Climes, MD  [facility] 01/23/2021, 1:52 PM Pager   If 7PM-7AM, please contact night-coverage [email or web address] Password [email or web address]

## 2021-01-23 NOTE — Progress Notes (Signed)
Physical Therapy Treatment Patient Details Name: Travis Reese MRN: [id number] DOB: May 05, 1946 Today's Date: 01/23/2021    History of Present Illness Pt is a 75 y.o. male admitted 01/19/21 with weakness, dysuria, and fatigue. Workup for UTI. 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 Today's session focused on transfer and gait training with RW; pt requiring mod-maxA for standing and very short bouts of ambulation distance. Pt with bowel incontinence; dependent for posterior pericare. Pt remains limited by decreased activity tolerance, generalized weakness and difficulty with movement initiation/sequencing; at high risk for falls and readmission. Increased time discussing safe d/c plans with pt and daughter (over phone). Pt ultimately agreeable with recommendation for SNF-level therapies to maximize functional mobility and independence prior to return home. Will continue to follow acutely to address established goals.    Follow Up Recommendations  SNF;Supervision for mobility/OOB     Equipment Recommendations  Wheelchair (measurements PT);Wheelchair cushion (measurements PT);Hospital bed    Recommendations for Other Services       Precautions / Restrictions Precautions Precautions: Fall;Other (comment) Precaution Comments: Parkinsons-like presentation; bladder/bowel incontinence Restrictions Weight Bearing Restrictions: No    Mobility  Bed Mobility Overal bed mobility: Needs Assistance Bed Mobility: Supine to Sit     Supine to sit: Max assist;HOB elevated     General bed mobility comments: Verbal cues to initiate movement, pt minimally bringing BLEs towards EOB, eventually requiring maxA for trunk elevation and scooting hips    Transfers Overall transfer level: Needs assistance Equipment used: Rolling walker (2  wheeled) Transfers: Sit to/from Stand Sit to Stand: Mod assist;Max assist;From elevated surface         General transfer comment: Max verbal cues for movement initiation and sequencing, pt with difficulty rocking forward for anterior weight translation, initial maxA to stand from elevated EOB to RW; 2x additional standing trials from recliner to RW with modA, heavy reliance on BUE support of armrests; maxA for eccentric control to sit  Ambulation/Gait Ambulation/Gait assistance: Mod assist;Max assist Gait Distance (Feet): 4 Feet Assistive device: Rolling walker (2 wheeled) Gait Pattern/deviations: Step-to pattern;Shuffle;Festinating;Trunk flexed;Narrow base of support;Leaning posteriorly Gait velocity: Decreased   General Gait Details: Slow, very unsteady gait with RW and modA for stability. Pt with heavy posterior lean, somewhat improved when initiating forward ambulation; shuffling/festinating gait with cues for "big steps" and frequent cues for more upright posture with forward lean; pt endorses limited by fatigue and feeling "unsteady"   Stairs             Wheelchair Mobility    Modified Rankin (Stroke Patients Only)       Balance Overall balance assessment: Needs assistance Sitting-balance support: No upper extremity supported;Feet supported Sitting balance-Leahy Scale: Poor Sitting balance - Comments: Reliant on UE support to maintain static sitting balance Postural control: Posterior lean Standing balance support: Bilateral upper extremity supported Standing balance-Leahy Scale: Poor Standing balance comment: Reliant on external assist and BUE support; dependent for posterior pericare (bowel incontinence)                            Cognition Arousal/Alertness: Awake/alert Behavior During Therapy: Flat affect Overall Cognitive Status: History of cognitive impairments - at baseline Area of Impairment: Following  commands;Safety/judgement;Awareness;Problem solving  Following Commands: Follows one step commands with increased time;Follows one step commands inconsistently Safety/Judgement: Decreased awareness of safety;Decreased awareness of deficits Awareness: Emergent Problem Solving: Decreased initiation;Slow processing;Requires verbal cues;Requires tactile cues;Difficulty sequencing        Exercises      General Comments General comments (skin integrity, edema, etc.): Increased time discussing safe d/c planning as pt preparing for d/c home this afternoon. Pt called daughter at end of session to join discussion; ultimately deciding pt not safe to return home as family not able to provide adequate physical assist; pt and family now agreeable to short-term SNF for rehab. SW and MD notified of change in d/c plans      Pertinent Vitals/Pain Pain Assessment: No/denies pain    Home Living                      Prior Function            PT Goals (current goals can now be found in the care plan section) Progress towards PT goals: Progressing toward goals (slowly; limited by fatigue)    Frequency    Min 2X/week      PT Plan Current plan remains appropriate    Co-evaluation              AM-PAC PT "6 Clicks" Mobility   Outcome Measure  Help needed turning from your back to your side while in a flat bed without using bedrails?: A Lot Help needed moving from lying on your back to sitting on the side of a flat bed without using bedrails?: A Lot Help needed moving to and from a bed to a chair (including a wheelchair)?: A Lot Help needed standing up from a chair using your arms (e.g., wheelchair or bedside chair)?: A Lot Help needed to walk in hospital room?: A Lot Help needed climbing 3-5 steps with a railing? : Total 6 Click Score: 11    End of Session Equipment Utilized During Treatment: Gait belt Activity Tolerance: Patient limited by  fatigue;Patient tolerated treatment well Patient left: in chair;with call bell/phone within reach;with chair alarm set Nurse Communication: Mobility status;Other (comment) (NT notified of need for condom cath & sacral pad/dressing) PT Visit Diagnosis: Other abnormalities of gait and mobility (R26.89);Muscle weakness (generalized) (M62.81);History of falling (Z91.81);Other symptoms and signs involving the nervous system (R29.898)     Time: 1540-0867 PT Time Calculation (min) (ACUTE ONLY): 54 min  Charges:  $Therapeutic Activity: 38-52 mins $Self Care/Home Management: 8-22                    Mabeline Caras, PT, DPT Acute Rehabilitation Services  Pager [phone] Office [place] 01/23/2021, 5:10 PM

## 2021-01-23 NOTE — TOC Initial Note (Signed)
Transition of Care [facility]) - Initial/Assessment Note    Patient Details  Name: Travis Reese MRN: [id number] Date of Birth: [birth year]  Transition of [facility]) CM/SW Contact:    Joanne Chars, LCSW Phone Number: 01/23/2021, 8:19 AM  Clinical Narrative:   CSW met with pt regarding discharge recommendation for SNF.  Pt reports he was recently discharged [facility] in [place] and spent more than 2 weeks there.  Recently started [facility] services with [facility].  Pt aware that he would be close to his copay days if he were to return to SNF and says that this is a financial barrier.  Pt lives with his significant other, Travis Reese, who is home full time to assist him and he would like to return home with her and continue with [facility] services.  Permission given to speak to Travis Reese and also to pt daughter, Travis Reese.  PCP in place. Current equipment in home: electric lift chair, walker, shower bench.  Pt has been vaccinated for covid and boosted.  CSW was able to speak with daughter Travis Reese, who provided phone number for Theda Sers: [phone].  Travis Reese confirms that copay days would be difficult to manage if pt were to return to SNF.  It has been difficult for pt and Travis Reese to manage but she would support this.  She did ask CSW to speak with Travis Reese as well to get input from her.  CSW LM with Theda Sers asking for return call.                  Expected Discharge Plan: [place] Barriers to Discharge: Continued Medical Work up   Patient Goals and CMS Choice Patient states their goals for this hospitalization and ongoing recovery are:: pt did not have one CMS Medicare.gov Compare Post Acute Care list provided to::  (NA-wants to continue with [facility])    Expected Discharge Plan and Services Expected Discharge Plan: [place] Choice: [place] arrangements for the past 2 months: Single Family Home                                       Prior Living Arrangements/Services Living arrangements for the past 2 months: Single Family Home Lives with:: Significant Other Patient language and need for interpreter reviewed:: Yes Do you feel safe going back to the place where you live?: Yes      Need for Family Participation in Patient Care: Yes (Comment) Care giver support system in place?: Yes (comment) Current home services: Home OT,Home PT Criminal Activity/Legal Involvement Pertinent to Current Situation/Hospitalization: No - Comment as needed  Activities of Daily Living Home Assistive Devices/Equipment: Dentures (specify type) ADL Screening (condition at time of admission) Patient's cognitive ability adequate to safely complete daily activities?: Yes Is the patient deaf or have difficulty hearing?: Yes Does the patient have difficulty seeing, even when wearing glasses/contacts?: No Does the patient have difficulty concentrating, remembering, or making decisions?: No Patient able to express need for assistance with ADLs?: Yes Does the patient have difficulty dressing or bathing?: Yes Independently performs ADLs?: No Communication: Needs assistance Is this a change from baseline?: Pre-admission baseline Dressing (OT): Needs assistance Is this a change from baseline?: Pre-admission baseline Grooming: Needs assistance Is this a change from baseline?: Pre-admission baseline Feeding: Independent Bathing: Needs assistance Is  this a change from baseline?: Pre-admission baseline Toileting: Needs assistance Is this a change from baseline?: Pre-admission baseline In/Out Bed: Needs assistance Is this a change from baseline?: Pre-admission baseline Walks in Home: Dependent Is this a change from baseline?: Pre-admission baseline Does the patient have difficulty walking or climbing stairs?: Yes Weakness of Legs: Both Weakness of Arms/Hands: Both  Permission Sought/Granted Permission  sought to share information with : Family Supports Permission granted to share information with : Yes, Verbal Permission Granted  Share Information with NAME: significant other Travis Reese, daughter Travis Reese  Permission granted to share info w AGENCY: [facility]        Emotional Assessment Appearance:: Appears stated age Attitude/Demeanor/Rapport: Engaged Affect (typically observed): Appropriate,Pleasant Orientation: : Oriented to Self,Oriented to Place,Oriented to  Time,Oriented to Situation Alcohol / Substance Use: Not Applicable Psych Involvement: No (comment)  Admission diagnosis:  UTI (urinary tract infection) [N39.0] Acute cystitis without hematuria [N30.00] Patient Active Problem List   Diagnosis Date Noted  . Pressure injury of skin 01/20/2021  . Depression   . History of CVA (cerebrovascular accident)   . UTI (urinary tract infection) 01/19/2021  . 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 Tremor   . Weakness 12/15/2020  . S/P aortic valve replacement with bioprosthetic valve 05/01/2018  . HTN (hypertension) 04/19/2018  . Chronic diastolic CHF (congestive heart failure) ([place]) 04/19/2018  . BPH (benign prostatic hyperplasia) 04/19/2018  . Anxiety 04/19/2018  . Cellulitis of face 10/15/2016  . Mixed hyperlipidemia 09/23/2016  . Other fatigue 07/23/2016  . Recurrent sinusitis 07/23/2016  . Other emphysema ([place]) 07/11/2016  . Restless leg syndrome 07/10/2016  . Plantar wart of left foot 01/05/2016  . Prolapsed internal hemorrhoids, grade 3 04/19/2015   PCP:  Janine Limbo, PA-C Pharmacy:   [facility], [address], [address] [place], [place] [address] Phone: [phone] Fax: [phone]  [facility] [place], [address] [place] [address] Phone: [phone] Fax: [phone]  [facility], [place] [address] [place] [address] Phone: [phone] Fax: [phone]     Social Determinants of Health (SDOH) Interventions    Readmission Risk Interventions Readmission Risk Prevention Plan 05/08/2018  Transportation Screening Complete  PCP or Specialist Appt within 5-7 Days Complete  Home Care Screening Complete  Medication Review (RN CM) Complete  Some recent data might be hidden

## 2021-01-23 NOTE — Progress Notes (Signed)
    RE:  Travis Reese       Date of Birth: 2046/06/09      Date:   01/23/21       To Whom It May Concern:  Please be advised that the above-named patient will require a short-term nursing home stay - anticipated 30 days or less for rehabilitation and strengthening.  The plan is for return home.                 MD signature                Date

## 2021-01-24 ENCOUNTER — Other Ambulatory Visit: Payer: Self-pay | Admitting: *Deleted

## 2021-01-24 DIAGNOSIS — R531 Weakness: Secondary | ICD-10-CM | POA: Diagnosis not present

## 2021-01-24 DIAGNOSIS — R0902 Hypoxemia: Secondary | ICD-10-CM | POA: Diagnosis not present

## 2021-01-24 DIAGNOSIS — M6281 Muscle weakness (generalized): Secondary | ICD-10-CM | POA: Diagnosis not present

## 2021-01-24 DIAGNOSIS — I5030 Unspecified diastolic (congestive) heart failure: Secondary | ICD-10-CM | POA: Diagnosis not present

## 2021-01-24 DIAGNOSIS — R2681 Unsteadiness on feet: Secondary | ICD-10-CM | POA: Diagnosis not present

## 2021-01-24 DIAGNOSIS — M1990 Unspecified osteoarthritis, unspecified site: Secondary | ICD-10-CM | POA: Diagnosis not present

## 2021-01-24 DIAGNOSIS — I10 Essential (primary) hypertension: Secondary | ICD-10-CM | POA: Diagnosis not present

## 2021-01-24 DIAGNOSIS — M6259 Muscle wasting and atrophy, not elsewhere classified, multiple sites: Secondary | ICD-10-CM | POA: Diagnosis not present

## 2021-01-24 DIAGNOSIS — R1311 Dysphagia, oral phase: Secondary | ICD-10-CM | POA: Diagnosis not present

## 2021-01-24 DIAGNOSIS — H00012 Hordeolum externum right lower eyelid: Secondary | ICD-10-CM | POA: Diagnosis not present

## 2021-01-24 DIAGNOSIS — Z9181 History of falling: Secondary | ICD-10-CM | POA: Diagnosis not present

## 2021-01-24 DIAGNOSIS — G20 Parkinson's disease: Secondary | ICD-10-CM | POA: Diagnosis not present

## 2021-01-24 DIAGNOSIS — Z95828 Presence of other vascular implants and grafts: Secondary | ICD-10-CM | POA: Diagnosis not present

## 2021-01-24 DIAGNOSIS — G2119 Other drug induced secondary parkinsonism: Secondary | ICD-10-CM | POA: Diagnosis not present

## 2021-01-24 DIAGNOSIS — Z743 Need for continuous supervision: Secondary | ICD-10-CM | POA: Diagnosis not present

## 2021-01-24 DIAGNOSIS — I110 Hypertensive heart disease with heart failure: Secondary | ICD-10-CM | POA: Diagnosis not present

## 2021-01-24 DIAGNOSIS — N3000 Acute cystitis without hematuria: Secondary | ICD-10-CM | POA: Diagnosis not present

## 2021-01-24 DIAGNOSIS — J449 Chronic obstructive pulmonary disease, unspecified: Secondary | ICD-10-CM | POA: Diagnosis not present

## 2021-01-24 DIAGNOSIS — E538 Deficiency of other specified B group vitamins: Secondary | ICD-10-CM | POA: Diagnosis not present

## 2021-01-24 DIAGNOSIS — I509 Heart failure, unspecified: Secondary | ICD-10-CM | POA: Diagnosis not present

## 2021-01-24 DIAGNOSIS — N390 Urinary tract infection, site not specified: Secondary | ICD-10-CM | POA: Diagnosis not present

## 2021-01-24 DIAGNOSIS — M4805 Spinal stenosis, thoracolumbar region: Secondary | ICD-10-CM | POA: Diagnosis not present

## 2021-01-24 LAB — RESP PANEL BY RT-PCR (FLU A&B, COVID) ARPGX2
Influenza A by PCR: NEGATIVE
Influenza B by PCR: NEGATIVE
SARS Coronavirus 2 by RT PCR: NEGATIVE

## 2021-01-24 MED ORDER — CLONAZEPAM 1 MG PO TABS: 1.0000 mg | ORAL_TABLET | Freq: Every day | ORAL | 0 refills | Status: DC

## 2021-01-24 NOTE — TOC Progression Note (Addendum)
Transition of Care [facility]) - Progression Note    Patient Details  Name: Travis Reese MRN: [id number] Date of Birth: March 09, 1946  Transition of Care [facility] [place]) CM/SW Contact  Joanne Chars, LCSW Phone Number: 01/24/2021, 7:23 AM  Clinical Narrative:    Level 2 PASSR received: [id number] Evelina Dun from [facility] does not have available beds at [facility], also having covid outbreak at [employer]   Arbie Cookey from [employer] reviewing referral.  SNF auth request faxed to [place].  2020: [facility] can accept pt, would be semi private room and she would need $1020 down payment.  CSW spoke with pt regarding this, he would like to go to [facility] due to location, agreeable to semi private.  CSW spoke with daughter and with [name] regarding need for down payment.    MD will order covid.      Expected Discharge Plan: [place] Barriers to Discharge: Barriers Resolved  Expected Discharge Plan and Services Expected Discharge Plan: [place] Choice: [place] arrangements for the past 2 months: Single Family Home Expected Discharge Date: 01/23/21               DME Arranged: N/A         HH Arranged: RN,PT,OT,Nurse's Aide [place]: [place] Date [place]: 01/23/21 Time [place]: 0156 Representative spoke with at [place]: [place] ([place]) Interventions    Readmission Risk Interventions Readmission Risk Prevention Plan 05/08/2018  Transportation Screening Complete  PCP or Specialist Appt within 5-7 Days Complete  Home Care Screening Complete  Medication Review (RN CM) Complete  Some recent data might be hidden

## 2021-01-24 NOTE — TOC Progression Note (Addendum)
Transition of Care [facility]) - Progression Note    Patient Details  Name: Travis Reese MRN: [id number] Date of Birth: 1946/04/24  Transition of Care [facility]) CM/SW [place] Phone Number: 01/24/2021, 12:30 PM  Clinical Narrative:     Auth# [id number] Auth  01/24/2021-01/26/2021   CSW updated [facility] with Josem Kaufmann. Details. They can accept pt today. Pt will go to Room 211B  CSW notified daughter of transport.  Expected Discharge Plan: [place] Barriers to Discharge: Barriers Resolved  Expected Discharge Plan and Services Expected Discharge Plan: [place] Choice: [place] arrangements for the past 2 months: Single Family Home Expected Discharge Date: 01/23/21               DME Arranged: N/A         HH Arranged: RN,PT,OT,Nurse's Aide [place]: [place] Date [place]: 01/23/21 Time [place]: [id number] Representative spoke with at [place]: [place] ([place]) Interventions    Readmission Risk Interventions Readmission Risk Prevention Plan 05/08/2018  Transportation Screening Complete  PCP or Specialist Appt within 5-7 Days Complete  Home Care Screening Complete  Medication Review (RN CM) Complete  Some recent data might be hidden

## 2021-01-24 NOTE — TOC Transition Note (Signed)
Transition of Care [facility]) - CM/SW Discharge Note   Patient Details  Name: Travis Reese MRN: [id number] Date of Birth: July 06, 1946  Transition of Care [facility]) CM/SW Contact:  Joanne Chars, LCSW Phone Number: 01/24/2021, 1:27 PM   Clinical Narrative:   [facility] [place], Room [facility].  RN call report to [phone].    Final next level of care: Skilled Nursing Facility Barriers to Discharge: Barriers Resolved   Patient Goals and CMS Choice Patient states their goals for this hospitalization and ongoing recovery are:: pt did not have one CMS Medicare.gov Compare Post Acute Care list provided to::  (NA-wants to continue with [facility])    Discharge Placement              Patient chooses bed at:  ([facility] [place]) Patient to be transferred to facility by: [place] Name of family member notified: daughter [name] Patient and family notified of of transfer: 01/24/21  Discharge Plan and Services     Post Acute Care Choice: Home Health          DME Arranged: N/A         HH Arranged: RN,PT,OT,Nurse's Aide [place] Agency: [place] Date [place]: 01/23/21 Time [place]: [place] Representative spoke with at [place]: [place] ([place]) Interventions     Readmission Risk Interventions Readmission Risk Prevention Plan 05/08/2018  Transportation Screening Complete  PCP or Specialist Appt within 5-7 Days Complete  Home Care Screening Complete  Medication Review (RN CM) Complete  Some recent data might be hidden

## 2021-01-25 ENCOUNTER — Encounter: Payer: Self-pay | Admitting: *Deleted

## 2021-01-25 DIAGNOSIS — N390 Urinary tract infection, site not specified: Secondary | ICD-10-CM | POA: Diagnosis not present

## 2021-01-25 DIAGNOSIS — G20 Parkinson's disease: Secondary | ICD-10-CM | POA: Diagnosis not present

## 2021-01-25 DIAGNOSIS — I110 Hypertensive heart disease with heart failure: Secondary | ICD-10-CM | POA: Diagnosis not present

## 2021-01-25 DIAGNOSIS — I5030 Unspecified diastolic (congestive) heart failure: Secondary | ICD-10-CM | POA: Diagnosis not present

## 2021-01-25 NOTE — Progress Notes (Signed)
Patient ID: Travis Reese, male   DOB: 1946/02/21, 75 y.o.   MRN: [id number] Patient enrolled for [employer] to ship a 30 day cardiac event monitor to: [name], in care of [address], [address] [place], [place]  [address] Attn: [place] [address] Nurse, Nadara Mustard or Juanda Crumble [phone]. Letter with instructions mailed to above address.

## 2021-01-26 ENCOUNTER — Telehealth: Payer: Self-pay | Admitting: Neurology

## 2021-01-26 NOTE — Telephone Encounter (Signed)
Follow up on DaT I reviewed in patient records.  thanks

## 2021-01-30 ENCOUNTER — Ambulatory Visit: Payer: Medicare Other | Admitting: Legal Medicine

## 2021-01-30 DIAGNOSIS — J449 Chronic obstructive pulmonary disease, unspecified: Secondary | ICD-10-CM | POA: Diagnosis not present

## 2021-01-30 DIAGNOSIS — M6281 Muscle weakness (generalized): Secondary | ICD-10-CM | POA: Diagnosis not present

## 2021-01-30 DIAGNOSIS — G20 Parkinson's disease: Secondary | ICD-10-CM | POA: Diagnosis not present

## 2021-01-30 NOTE — Telephone Encounter (Signed)
Following up on Dat Scan. Will update when done.

## 2021-01-30 NOTE — Progress Notes (Signed)
NO PA needed

## 2021-02-06 DIAGNOSIS — H00012 Hordeolum externum right lower eyelid: Secondary | ICD-10-CM | POA: Diagnosis not present

## 2021-02-07 ENCOUNTER — Other Ambulatory Visit (HOSPITAL_COMMUNITY): Payer: Self-pay | Admitting: Internal Medicine

## 2021-02-08 DIAGNOSIS — I110 Hypertensive heart disease with heart failure: Secondary | ICD-10-CM | POA: Diagnosis not present

## 2021-02-08 DIAGNOSIS — M6281 Muscle weakness (generalized): Secondary | ICD-10-CM | POA: Diagnosis not present

## 2021-02-08 DIAGNOSIS — G20 Parkinson's disease: Secondary | ICD-10-CM | POA: Diagnosis not present

## 2021-02-13 DIAGNOSIS — E538 Deficiency of other specified B group vitamins: Secondary | ICD-10-CM | POA: Diagnosis not present

## 2021-02-13 DIAGNOSIS — I110 Hypertensive heart disease with heart failure: Secondary | ICD-10-CM | POA: Diagnosis not present

## 2021-02-13 DIAGNOSIS — R1311 Dysphagia, oral phase: Secondary | ICD-10-CM | POA: Diagnosis not present

## 2021-02-13 DIAGNOSIS — I2510 Atherosclerotic heart disease of native coronary artery without angina pectoris: Secondary | ICD-10-CM | POA: Diagnosis not present

## 2021-02-13 DIAGNOSIS — J449 Chronic obstructive pulmonary disease, unspecified: Secondary | ICD-10-CM | POA: Diagnosis not present

## 2021-02-13 DIAGNOSIS — E559 Vitamin D deficiency, unspecified: Secondary | ICD-10-CM | POA: Diagnosis not present

## 2021-02-13 DIAGNOSIS — I509 Heart failure, unspecified: Secondary | ICD-10-CM | POA: Diagnosis not present

## 2021-02-14 ENCOUNTER — Ambulatory Visit: Payer: Medicare Other | Admitting: Neurology

## 2021-02-15 ENCOUNTER — Other Ambulatory Visit (HOSPITAL_COMMUNITY): Payer: Self-pay | Admitting: Cardiology

## 2021-02-15 DIAGNOSIS — L89612 Pressure ulcer of right heel, stage 2: Secondary | ICD-10-CM | POA: Diagnosis not present

## 2021-02-15 DIAGNOSIS — M4802 Spinal stenosis, cervical region: Secondary | ICD-10-CM | POA: Diagnosis not present

## 2021-02-15 DIAGNOSIS — I5030 Unspecified diastolic (congestive) heart failure: Secondary | ICD-10-CM | POA: Diagnosis not present

## 2021-02-15 DIAGNOSIS — Z7902 Long term (current) use of antithrombotics/antiplatelets: Secondary | ICD-10-CM | POA: Diagnosis not present

## 2021-02-15 DIAGNOSIS — E538 Deficiency of other specified B group vitamins: Secondary | ICD-10-CM | POA: Diagnosis not present

## 2021-02-15 DIAGNOSIS — I2510 Atherosclerotic heart disease of native coronary artery without angina pectoris: Secondary | ICD-10-CM | POA: Diagnosis not present

## 2021-02-15 DIAGNOSIS — Z9181 History of falling: Secondary | ICD-10-CM | POA: Diagnosis not present

## 2021-02-15 DIAGNOSIS — E559 Vitamin D deficiency, unspecified: Secondary | ICD-10-CM | POA: Diagnosis not present

## 2021-02-15 DIAGNOSIS — R1311 Dysphagia, oral phase: Secondary | ICD-10-CM | POA: Diagnosis not present

## 2021-02-15 DIAGNOSIS — G20 Parkinson's disease: Secondary | ICD-10-CM | POA: Diagnosis not present

## 2021-02-15 DIAGNOSIS — J449 Chronic obstructive pulmonary disease, unspecified: Secondary | ICD-10-CM | POA: Diagnosis not present

## 2021-02-15 DIAGNOSIS — D692 Other nonthrombocytopenic purpura: Secondary | ICD-10-CM | POA: Diagnosis not present

## 2021-02-15 DIAGNOSIS — M4805 Spinal stenosis, thoracolumbar region: Secondary | ICD-10-CM | POA: Diagnosis not present

## 2021-02-15 DIAGNOSIS — I110 Hypertensive heart disease with heart failure: Secondary | ICD-10-CM | POA: Diagnosis not present

## 2021-02-15 DIAGNOSIS — G959 Disease of spinal cord, unspecified: Secondary | ICD-10-CM | POA: Diagnosis not present

## 2021-02-16 ENCOUNTER — Other Ambulatory Visit (HOSPITAL_COMMUNITY): Payer: Self-pay | Admitting: Internal Medicine

## 2021-02-16 NOTE — Telephone Encounter (Signed)
Carvedilol 25 mg # 180 [facility] with message for patient to keep scheduled appt for further refills

## 2021-02-18 DIAGNOSIS — K219 Gastro-esophageal reflux disease without esophagitis: Secondary | ICD-10-CM | POA: Diagnosis not present

## 2021-02-19 DIAGNOSIS — L89612 Pressure ulcer of right heel, stage 2: Secondary | ICD-10-CM | POA: Diagnosis not present

## 2021-02-19 DIAGNOSIS — E538 Deficiency of other specified B group vitamins: Secondary | ICD-10-CM | POA: Diagnosis not present

## 2021-02-19 DIAGNOSIS — R1311 Dysphagia, oral phase: Secondary | ICD-10-CM | POA: Diagnosis not present

## 2021-02-19 DIAGNOSIS — M4805 Spinal stenosis, thoracolumbar region: Secondary | ICD-10-CM | POA: Diagnosis not present

## 2021-02-19 DIAGNOSIS — I110 Hypertensive heart disease with heart failure: Secondary | ICD-10-CM | POA: Diagnosis not present

## 2021-02-19 DIAGNOSIS — M4802 Spinal stenosis, cervical region: Secondary | ICD-10-CM | POA: Diagnosis not present

## 2021-02-19 DIAGNOSIS — I2510 Atherosclerotic heart disease of native coronary artery without angina pectoris: Secondary | ICD-10-CM | POA: Diagnosis not present

## 2021-02-19 DIAGNOSIS — G959 Disease of spinal cord, unspecified: Secondary | ICD-10-CM | POA: Diagnosis not present

## 2021-02-19 DIAGNOSIS — G20 Parkinson's disease: Secondary | ICD-10-CM | POA: Diagnosis not present

## 2021-02-19 DIAGNOSIS — J449 Chronic obstructive pulmonary disease, unspecified: Secondary | ICD-10-CM | POA: Diagnosis not present

## 2021-02-19 DIAGNOSIS — E559 Vitamin D deficiency, unspecified: Secondary | ICD-10-CM | POA: Diagnosis not present

## 2021-02-19 DIAGNOSIS — D692 Other nonthrombocytopenic purpura: Secondary | ICD-10-CM | POA: Diagnosis not present

## 2021-02-19 DIAGNOSIS — Z7902 Long term (current) use of antithrombotics/antiplatelets: Secondary | ICD-10-CM | POA: Diagnosis not present

## 2021-02-19 DIAGNOSIS — I5030 Unspecified diastolic (congestive) heart failure: Secondary | ICD-10-CM | POA: Diagnosis not present

## 2021-02-19 DIAGNOSIS — Z9181 History of falling: Secondary | ICD-10-CM | POA: Diagnosis not present

## 2021-02-20 DIAGNOSIS — E038 Other specified hypothyroidism: Secondary | ICD-10-CM | POA: Diagnosis not present

## 2021-02-20 DIAGNOSIS — E7849 Other hyperlipidemia: Secondary | ICD-10-CM | POA: Diagnosis not present

## 2021-02-20 DIAGNOSIS — E559 Vitamin D deficiency, unspecified: Secondary | ICD-10-CM | POA: Diagnosis not present

## 2021-02-20 DIAGNOSIS — Z79899 Other long term (current) drug therapy: Secondary | ICD-10-CM | POA: Diagnosis not present

## 2021-02-20 DIAGNOSIS — E119 Type 2 diabetes mellitus without complications: Secondary | ICD-10-CM | POA: Diagnosis not present

## 2021-02-20 DIAGNOSIS — D518 Other vitamin B12 deficiency anemias: Secondary | ICD-10-CM | POA: Diagnosis not present

## 2021-02-21 ENCOUNTER — Encounter (HOSPITAL_COMMUNITY)
Admission: RE | Admit: 2021-02-21 | Discharge: 2021-02-21 | Disposition: A | Discharge status: Home / Self Care | Payer: Medicare Other | Source: Ambulatory Visit | Attending: Neurology | Admitting: Neurology

## 2021-02-21 ENCOUNTER — Other Ambulatory Visit: Payer: Self-pay

## 2021-02-21 ENCOUNTER — Telehealth: Payer: Self-pay | Admitting: Neurology

## 2021-02-21 DIAGNOSIS — R531 Weakness: Secondary | ICD-10-CM | POA: Diagnosis not present

## 2021-02-21 DIAGNOSIS — G20 Parkinson's disease: Secondary | ICD-10-CM | POA: Diagnosis not present

## 2021-02-21 DIAGNOSIS — G250 Essential tremor: Secondary | ICD-10-CM | POA: Diagnosis not present

## 2021-02-21 MED ORDER — IOFLUPANE I 123 185 MBQ/2.5ML IV SOLN
4.6000 | Freq: Once | INTRAVENOUS | Status: AC | PRN
  Administered 2021-02-21: 4.6 via INTRAVENOUS
  Filled 2021-02-21: qty 5

## 2021-02-21 MED ORDER — POTASSIUM IODIDE (ANTIDOTE) 130 MG PO TABS: 130.0000 mg | ORAL_TABLET | Freq: Once | ORAL | Status: AC

## 2021-02-21 MED ORDER — POTASSIUM IODIDE (ANTIDOTE) 130 MG PO TABS
ORAL_TABLET | ORAL | Status: AC
  Administered 2021-02-21: 130 mg via ORAL
  Filled 2021-02-21: qty 1

## 2021-02-21 NOTE — Telephone Encounter (Signed)
Pt's daughter called in and left a message wanting to speak with someone about the pt's DAT Scan results

## 2021-02-22 DIAGNOSIS — D692 Other nonthrombocytopenic purpura: Secondary | ICD-10-CM | POA: Diagnosis not present

## 2021-02-22 DIAGNOSIS — E559 Vitamin D deficiency, unspecified: Secondary | ICD-10-CM | POA: Diagnosis not present

## 2021-02-22 DIAGNOSIS — I5030 Unspecified diastolic (congestive) heart failure: Secondary | ICD-10-CM | POA: Diagnosis not present

## 2021-02-22 DIAGNOSIS — E538 Deficiency of other specified B group vitamins: Secondary | ICD-10-CM | POA: Diagnosis not present

## 2021-02-22 DIAGNOSIS — L89612 Pressure ulcer of right heel, stage 2: Secondary | ICD-10-CM | POA: Diagnosis not present

## 2021-02-22 DIAGNOSIS — R1311 Dysphagia, oral phase: Secondary | ICD-10-CM | POA: Diagnosis not present

## 2021-02-22 DIAGNOSIS — M4805 Spinal stenosis, thoracolumbar region: Secondary | ICD-10-CM | POA: Diagnosis not present

## 2021-02-22 DIAGNOSIS — I110 Hypertensive heart disease with heart failure: Secondary | ICD-10-CM | POA: Diagnosis not present

## 2021-02-22 DIAGNOSIS — Z9181 History of falling: Secondary | ICD-10-CM | POA: Diagnosis not present

## 2021-02-22 DIAGNOSIS — J449 Chronic obstructive pulmonary disease, unspecified: Secondary | ICD-10-CM | POA: Diagnosis not present

## 2021-02-22 DIAGNOSIS — Z7902 Long term (current) use of antithrombotics/antiplatelets: Secondary | ICD-10-CM | POA: Diagnosis not present

## 2021-02-22 DIAGNOSIS — G20 Parkinson's disease: Secondary | ICD-10-CM | POA: Diagnosis not present

## 2021-02-22 DIAGNOSIS — I2510 Atherosclerotic heart disease of native coronary artery without angina pectoris: Secondary | ICD-10-CM | POA: Diagnosis not present

## 2021-02-22 DIAGNOSIS — G959 Disease of spinal cord, unspecified: Secondary | ICD-10-CM | POA: Diagnosis not present

## 2021-02-22 DIAGNOSIS — M4802 Spinal stenosis, cervical region: Secondary | ICD-10-CM | POA: Diagnosis not present

## 2021-02-22 NOTE — Progress Notes (Signed)
Virtual Visit Via Video   The purpose of this virtual visit is to provide medical care while limiting exposure to the novel coronavirus.    Consent was obtained for video visit:  Yes.   Answered questions that patient had about telehealth interaction:  Yes.   I discussed the limitations, risks, security and privacy concerns of performing an evaluation and management service by telemedicine. I also discussed with the patient that there may be a patient responsible charge related to this service. The patient expressed understanding and agreed to proceed.  Pt location: Home Physician Location: office Name of referring provider:  O'Buch, Greta, PA-C I connected with Travis Reese at patients initiation/request on 02/23/2021 at 10:45 AM EDT by video enabled telemedicine application and verified that I am speaking with the correct person using two identifiers. Pt MRN:  [id number] Pt DOB:  Nov 15, 1945 Video Participants:  [name];  daughter [name] and girlfriend [name]   Assessment/Plan:   1.  Parkinsonism with tremor  -I had a long counseling session with the patient today.  My guess is that the patient likely has Parkinson's disease, significantly worsened by Abilify.  Patient has been off of the Abilify since mid April, 2022.  It can take about 6 months for secondary parkinsonism to resolve.    -My recommendation would be to go ahead and start the patient on levodopa.  Patient does have a family history of Parkinson's disease, so patient could certainly have one of the genetic forms of Parkinson's.  He was given a titration schedule and we discussed how to take it.  He will work up to carbidopa/levodopa 25/100, 1 tablet at 8 AM/noon/4 PM.  R/B/SE were discussed.  The opportunity to ask questions was given and they were answered to the best of my ability.  The patient expressed understanding and willingness to follow the outlined treatment protocols.  -Patient separately has a history of  essential tremor, which really complicates this matter.  -wife/pt/daughter asked questions and I answered to the best of my ability  -my chart message with instructions sent to patient and instructions sent to care facility and to [facility]   2.  Recent infarcts  -We discussed the diagnosis as well as pathophysiology of the disease.  We discussed signs and sx's of stroke and importance of calling 911 immediately should he have any of these.  Patient education was provided.    -Talked about stroke risk factors which include tobacco use, HTN, hyperlipidemia, previous hx of stroke, age.  Talked about those risk factors which are modifiable.  -Talked about importance of blood pressure control with a goal <130/80 mm Hg.   -Talked about importance of lipid control and proper diet.  Lipids should be managed intensively, with a goal LDL < 70 mg/dL.  His current lipids are well controlled with HDL of 55 and LDL 70.  -I counseled the patient on measures to reduce stroke risk, including the importance of medication compliance, risk factor control, exercise, healthy diet, and avoidance of smoking.    -Carotid ultrasound negative.  -Patient already on aspirin.  -Echocardiogram with normal ejection fraction, but saline contrast could not exclude ASD/PFO.  TEE was recommended.  Patient sees Dr. Sharol Harness.  We will send him back to cardiology for consideration.  3.  Pt has appt in November and he will keep that.   Subjective:   Travis Reese was seen today in the movement disorders clinic for follow-up on his DaTscan.  I personally reviewed  that, which was just done on June 22.  It was positive, demonstrating decreased uptake of the radiotracer bilaterally.  Patient did have carotid ultrasound done since last visit.  That was unremarkable.  He also had an echocardiogram.  Left ventricular ejection fraction was 55 to 60%.  Saline contrast was given, but ASD/PFO could not be excluded.    ALLERGIES:  No Known  Allergies  CURRENT MEDICATIONS:  Current Outpatient Medications  Medication Instructions   acetaminophen (TYLENOL) 650 mg, Oral, Every 6 hours PRN   albuterol (VENTOLIN HFA) 108 (90 Base) MCG/ACT inhaler 2 puffs, Inhalation, Every 6 hours PRN   aspirin 325 mg, Oral, Daily   carvedilol (COREG) 25 mg, Oral, 2 times daily with meals, Must keep scheduled appointment for further refills   celecoxib (CELEBREX) 100 mg, Oral, Every morning   clonazePAM (KLONOPIN) 1 mg, Oral, Daily at bedtime   cyanocobalamin ((VITAMIN B-12)) 1,000 mcg, Intramuscular, Every 30 days   DULoxetine (CYMBALTA) 30 mg, Oral, Daily   feeding supplement (ENSURE ENLIVE / ENSURE PLUS) LIQD 237 mLs, Oral, 3 times daily between meals   fluticasone (FLONASE) 50 MCG/ACT nasal spray 1 spray, Each Nare, Daily at bedtime   folic acid (FOLVITE) 1 mg, Oral, Daily   Krill Oil 1,000 mg, Oral, Daily at bedtime   Melatonin 10 mg, Oral, Daily at bedtime   montelukast (SINGULAIR) 10 mg, Oral, Daily at bedtime   primidone (MYSOLINE) 50 mg, Oral, Daily at bedtime   spironolactone (ALDACTONE) 12.5 mg, Oral, Daily, NEEDS AN APPOINTMENT FOR FURTHER REFILLS   Syringe/Needle, Disp, (SYRINGE 3CC/18GX1-1/2") 18G X 1-1/2" 3 ML MISC Use for B12 IM injections   tamsulosin (FLOMAX) 0.4 mg, Oral, 2 times daily after meals   Vitamin D (Ergocalciferol) (DRISDOL) 50,000 Units, Oral, Every Mon   Zinc 50 mg, Oral, Daily at bedtime    Objective:   VITALS:   There were no vitals filed for this visit.    Neurological examination:  Orientation: The patient is alert and oriented x3. Interacts appropriately and asks appropriate questions. Speech fluent and clear  I have reviewed and interpreted the following labs independently   Chemistry      Component Value Date/Time   NA 131 (L) 01/22/2021 0415   K 4.0 01/22/2021 0415   CL 98 01/22/2021 0415   CO2 22 01/22/2021 0415   BUN 14 01/22/2021 0415   CREATININE 0.63 01/22/2021 0415   CREATININE  0.77 07/21/2017 1401      Component Value Date/Time   CALCIUM 8.5 (L) 01/22/2021 0415   ALKPHOS 59 01/20/2021 0332   AST 15 01/20/2021 0332   ALT 10 01/20/2021 0332   BILITOT 1.3 (H) 01/20/2021 0332      Lab Results  Component Value Date   TSH 2.294 01/21/2021   Lab Results  Component Value Date   WBC 5.8 01/22/2021   HGB 12.7 (L) 01/22/2021   HCT 35.7 (L) 01/22/2021   MCV 96.5 01/22/2021   PLT 158 01/22/2021   Primary care sent over lab work from May 17.  Vitamin B12 was 684.  Total cholesterol was 138, HDL 55, LDL 70  Total time spent on today's visit was 40 minutes, including both face-to-face time and nonface-to-face time.  Time included that spent on review of records (prior notes available to me/labs/imaging if pertinent), discussing treatment and goals, answering patient's questions and coordinating care.  Cc:  O'Buch, Greta, PA-C

## 2021-02-22 NOTE — Telephone Encounter (Signed)
Pt is aware of the appt on 02-23-21 @ 10:45

## 2021-02-22 NOTE — Telephone Encounter (Signed)
Called patients daughter and informed her per Dr. Carles Collet "Personally reviewed DaT scan and decreased radiotracer uptake bilaterally.  Daughter called yesterday for results.  Scan just done yesterday and results not back until 8pm.  Let her know she needs to give [place] a few days once scan completed (she called before we even got results)."  Patients daughter stated that she wasn't calling for results, she wanted to go over how her dads conditions declining. I informed patients daughter that the note from the front specially stated that she wanted DAT Scan results, so I apologized to patients daughter for the confusion. Patients daughter wanted her father seen and I informed her that patient was added to Dr. Doristine Devoid schedule for tomorrow 02/23/21 at 10:45 am. Patients daughter stated that she was unaware, and I informed her that her father was informed by Hinton Dyer.   Patients daughter is aware of appt time and will address her issues and concerns with Dr. Carles Collet at patients appt. Patients daughter had no further questions or concerns.

## 2021-02-22 NOTE — Telephone Encounter (Signed)
Personally reviewed DaT scan and decreased radiotracer uptake bilaterally.  Daughter called yesterday for results.  Scan just done yesterday and results not back until 8pm.  Let her know she needs to give [place] a few days once scan completed (she called before we even got results).  Hinton Dyer, put him in tomorrow at 10:45 to go over results.  He may only be able to do VV (had trouble getting in here previously but not sure he can do VV either)

## 2021-02-23 ENCOUNTER — Telehealth (INDEPENDENT_AMBULATORY_CARE_PROVIDER_SITE_OTHER): Payer: Medicare Other | Admitting: Neurology

## 2021-02-23 ENCOUNTER — Encounter: Payer: Self-pay | Admitting: Neurology

## 2021-02-23 ENCOUNTER — Other Ambulatory Visit: Payer: Self-pay

## 2021-02-23 VITALS — Ht 74.0 in | Wt 182.0 lb

## 2021-02-23 DIAGNOSIS — I639 Cerebral infarction, unspecified: Secondary | ICD-10-CM | POA: Diagnosis not present

## 2021-02-23 DIAGNOSIS — G20A1 Parkinson's disease without dyskinesia, without mention of fluctuations: Secondary | ICD-10-CM | POA: Insufficient documentation

## 2021-02-23 DIAGNOSIS — G20 Parkinson's disease: Secondary | ICD-10-CM

## 2021-02-23 MED ORDER — CARBIDOPA-LEVODOPA 25-100 MG PO TABS
1.0000 | ORAL_TABLET | Freq: Three times a day (TID) | ORAL | 1 refills | Status: DC
Start: 2021-02-23 — End: 2021-02-23

## 2021-02-23 MED ORDER — CARBIDOPA-LEVODOPA 25-100 MG PO TABS: ORAL_TABLET | ORAL | 1 refills | Status: DC

## 2021-02-23 NOTE — Addendum Note (Signed)
Addended by: Armen Pickup A on: 02/23/2021 11:43 AM   Modules accepted: Orders

## 2021-02-26 DIAGNOSIS — I5030 Unspecified diastolic (congestive) heart failure: Secondary | ICD-10-CM | POA: Diagnosis not present

## 2021-02-26 DIAGNOSIS — J449 Chronic obstructive pulmonary disease, unspecified: Secondary | ICD-10-CM | POA: Diagnosis not present

## 2021-02-26 DIAGNOSIS — M4805 Spinal stenosis, thoracolumbar region: Secondary | ICD-10-CM | POA: Diagnosis not present

## 2021-02-26 DIAGNOSIS — G20 Parkinson's disease: Secondary | ICD-10-CM | POA: Diagnosis not present

## 2021-02-26 DIAGNOSIS — G959 Disease of spinal cord, unspecified: Secondary | ICD-10-CM | POA: Diagnosis not present

## 2021-02-26 DIAGNOSIS — L89612 Pressure ulcer of right heel, stage 2: Secondary | ICD-10-CM | POA: Diagnosis not present

## 2021-02-26 DIAGNOSIS — I2510 Atherosclerotic heart disease of native coronary artery without angina pectoris: Secondary | ICD-10-CM | POA: Diagnosis not present

## 2021-02-26 DIAGNOSIS — E559 Vitamin D deficiency, unspecified: Secondary | ICD-10-CM | POA: Diagnosis not present

## 2021-02-26 DIAGNOSIS — R1311 Dysphagia, oral phase: Secondary | ICD-10-CM | POA: Diagnosis not present

## 2021-02-26 DIAGNOSIS — E538 Deficiency of other specified B group vitamins: Secondary | ICD-10-CM | POA: Diagnosis not present

## 2021-02-26 DIAGNOSIS — Z9181 History of falling: Secondary | ICD-10-CM | POA: Diagnosis not present

## 2021-02-26 DIAGNOSIS — M4802 Spinal stenosis, cervical region: Secondary | ICD-10-CM | POA: Diagnosis not present

## 2021-02-26 DIAGNOSIS — Z7902 Long term (current) use of antithrombotics/antiplatelets: Secondary | ICD-10-CM | POA: Diagnosis not present

## 2021-02-26 DIAGNOSIS — D692 Other nonthrombocytopenic purpura: Secondary | ICD-10-CM | POA: Diagnosis not present

## 2021-02-26 DIAGNOSIS — I110 Hypertensive heart disease with heart failure: Secondary | ICD-10-CM | POA: Diagnosis not present

## 2021-02-27 DIAGNOSIS — I5030 Unspecified diastolic (congestive) heart failure: Secondary | ICD-10-CM | POA: Diagnosis not present

## 2021-02-27 DIAGNOSIS — Z9181 History of falling: Secondary | ICD-10-CM | POA: Diagnosis not present

## 2021-02-27 DIAGNOSIS — I110 Hypertensive heart disease with heart failure: Secondary | ICD-10-CM | POA: Diagnosis not present

## 2021-02-27 DIAGNOSIS — E538 Deficiency of other specified B group vitamins: Secondary | ICD-10-CM | POA: Diagnosis not present

## 2021-02-27 DIAGNOSIS — R1311 Dysphagia, oral phase: Secondary | ICD-10-CM | POA: Diagnosis not present

## 2021-02-27 DIAGNOSIS — I2510 Atherosclerotic heart disease of native coronary artery without angina pectoris: Secondary | ICD-10-CM | POA: Diagnosis not present

## 2021-02-27 DIAGNOSIS — D692 Other nonthrombocytopenic purpura: Secondary | ICD-10-CM | POA: Diagnosis not present

## 2021-02-27 DIAGNOSIS — G20 Parkinson's disease: Secondary | ICD-10-CM | POA: Diagnosis not present

## 2021-02-27 DIAGNOSIS — E559 Vitamin D deficiency, unspecified: Secondary | ICD-10-CM | POA: Diagnosis not present

## 2021-02-27 DIAGNOSIS — M4805 Spinal stenosis, thoracolumbar region: Secondary | ICD-10-CM | POA: Diagnosis not present

## 2021-02-27 DIAGNOSIS — L89612 Pressure ulcer of right heel, stage 2: Secondary | ICD-10-CM | POA: Diagnosis not present

## 2021-02-27 DIAGNOSIS — J449 Chronic obstructive pulmonary disease, unspecified: Secondary | ICD-10-CM | POA: Diagnosis not present

## 2021-02-27 DIAGNOSIS — Z7902 Long term (current) use of antithrombotics/antiplatelets: Secondary | ICD-10-CM | POA: Diagnosis not present

## 2021-02-27 DIAGNOSIS — M4802 Spinal stenosis, cervical region: Secondary | ICD-10-CM | POA: Diagnosis not present

## 2021-02-27 DIAGNOSIS — G959 Disease of spinal cord, unspecified: Secondary | ICD-10-CM | POA: Diagnosis not present

## 2021-02-28 DIAGNOSIS — E119 Type 2 diabetes mellitus without complications: Secondary | ICD-10-CM | POA: Diagnosis not present

## 2021-02-28 DIAGNOSIS — I2510 Atherosclerotic heart disease of native coronary artery without angina pectoris: Secondary | ICD-10-CM | POA: Diagnosis not present

## 2021-02-28 DIAGNOSIS — R1311 Dysphagia, oral phase: Secondary | ICD-10-CM | POA: Diagnosis not present

## 2021-02-28 DIAGNOSIS — G959 Disease of spinal cord, unspecified: Secondary | ICD-10-CM | POA: Diagnosis not present

## 2021-02-28 DIAGNOSIS — I509 Heart failure, unspecified: Secondary | ICD-10-CM | POA: Diagnosis not present

## 2021-02-28 DIAGNOSIS — I5030 Unspecified diastolic (congestive) heart failure: Secondary | ICD-10-CM | POA: Diagnosis not present

## 2021-02-28 DIAGNOSIS — M4805 Spinal stenosis, thoracolumbar region: Secondary | ICD-10-CM | POA: Diagnosis not present

## 2021-02-28 DIAGNOSIS — Z7902 Long term (current) use of antithrombotics/antiplatelets: Secondary | ICD-10-CM | POA: Diagnosis not present

## 2021-02-28 DIAGNOSIS — E538 Deficiency of other specified B group vitamins: Secondary | ICD-10-CM | POA: Diagnosis not present

## 2021-02-28 DIAGNOSIS — D518 Other vitamin B12 deficiency anemias: Secondary | ICD-10-CM | POA: Diagnosis not present

## 2021-02-28 DIAGNOSIS — G20 Parkinson's disease: Secondary | ICD-10-CM | POA: Diagnosis not present

## 2021-02-28 DIAGNOSIS — L89612 Pressure ulcer of right heel, stage 2: Secondary | ICD-10-CM | POA: Diagnosis not present

## 2021-02-28 DIAGNOSIS — M4802 Spinal stenosis, cervical region: Secondary | ICD-10-CM | POA: Diagnosis not present

## 2021-02-28 DIAGNOSIS — E559 Vitamin D deficiency, unspecified: Secondary | ICD-10-CM | POA: Diagnosis not present

## 2021-02-28 DIAGNOSIS — D692 Other nonthrombocytopenic purpura: Secondary | ICD-10-CM | POA: Diagnosis not present

## 2021-02-28 DIAGNOSIS — J449 Chronic obstructive pulmonary disease, unspecified: Secondary | ICD-10-CM | POA: Diagnosis not present

## 2021-02-28 DIAGNOSIS — I110 Hypertensive heart disease with heart failure: Secondary | ICD-10-CM | POA: Diagnosis not present

## 2021-02-28 DIAGNOSIS — E038 Other specified hypothyroidism: Secondary | ICD-10-CM | POA: Diagnosis not present

## 2021-02-28 DIAGNOSIS — Z9181 History of falling: Secondary | ICD-10-CM | POA: Diagnosis not present

## 2021-03-01 DIAGNOSIS — E538 Deficiency of other specified B group vitamins: Secondary | ICD-10-CM | POA: Diagnosis not present

## 2021-03-01 DIAGNOSIS — G20 Parkinson's disease: Secondary | ICD-10-CM | POA: Diagnosis not present

## 2021-03-01 DIAGNOSIS — I2510 Atherosclerotic heart disease of native coronary artery without angina pectoris: Secondary | ICD-10-CM | POA: Diagnosis not present

## 2021-03-01 DIAGNOSIS — D692 Other nonthrombocytopenic purpura: Secondary | ICD-10-CM | POA: Diagnosis not present

## 2021-03-01 DIAGNOSIS — G959 Disease of spinal cord, unspecified: Secondary | ICD-10-CM | POA: Diagnosis not present

## 2021-03-01 DIAGNOSIS — I5030 Unspecified diastolic (congestive) heart failure: Secondary | ICD-10-CM | POA: Diagnosis not present

## 2021-03-01 DIAGNOSIS — R1311 Dysphagia, oral phase: Secondary | ICD-10-CM | POA: Diagnosis not present

## 2021-03-01 DIAGNOSIS — J449 Chronic obstructive pulmonary disease, unspecified: Secondary | ICD-10-CM | POA: Diagnosis not present

## 2021-03-01 DIAGNOSIS — Z9181 History of falling: Secondary | ICD-10-CM | POA: Diagnosis not present

## 2021-03-01 DIAGNOSIS — E559 Vitamin D deficiency, unspecified: Secondary | ICD-10-CM | POA: Diagnosis not present

## 2021-03-01 DIAGNOSIS — M4805 Spinal stenosis, thoracolumbar region: Secondary | ICD-10-CM | POA: Diagnosis not present

## 2021-03-01 DIAGNOSIS — I110 Hypertensive heart disease with heart failure: Secondary | ICD-10-CM | POA: Diagnosis not present

## 2021-03-01 DIAGNOSIS — M4802 Spinal stenosis, cervical region: Secondary | ICD-10-CM | POA: Diagnosis not present

## 2021-03-01 DIAGNOSIS — Z7902 Long term (current) use of antithrombotics/antiplatelets: Secondary | ICD-10-CM | POA: Diagnosis not present

## 2021-03-01 DIAGNOSIS — L89612 Pressure ulcer of right heel, stage 2: Secondary | ICD-10-CM | POA: Diagnosis not present

## 2021-03-06 DIAGNOSIS — J449 Chronic obstructive pulmonary disease, unspecified: Secondary | ICD-10-CM | POA: Diagnosis not present

## 2021-03-06 DIAGNOSIS — M79642 Pain in left hand: Secondary | ICD-10-CM | POA: Diagnosis not present

## 2021-03-06 DIAGNOSIS — I2510 Atherosclerotic heart disease of native coronary artery without angina pectoris: Secondary | ICD-10-CM | POA: Diagnosis not present

## 2021-03-06 DIAGNOSIS — D692 Other nonthrombocytopenic purpura: Secondary | ICD-10-CM | POA: Diagnosis not present

## 2021-03-06 DIAGNOSIS — I110 Hypertensive heart disease with heart failure: Secondary | ICD-10-CM | POA: Diagnosis not present

## 2021-03-06 DIAGNOSIS — I5030 Unspecified diastolic (congestive) heart failure: Secondary | ICD-10-CM | POA: Diagnosis not present

## 2021-03-06 DIAGNOSIS — M4802 Spinal stenosis, cervical region: Secondary | ICD-10-CM | POA: Diagnosis not present

## 2021-03-06 DIAGNOSIS — M4805 Spinal stenosis, thoracolumbar region: Secondary | ICD-10-CM | POA: Diagnosis not present

## 2021-03-06 DIAGNOSIS — Z9181 History of falling: Secondary | ICD-10-CM | POA: Diagnosis not present

## 2021-03-06 DIAGNOSIS — G20 Parkinson's disease: Secondary | ICD-10-CM | POA: Diagnosis not present

## 2021-03-06 DIAGNOSIS — E559 Vitamin D deficiency, unspecified: Secondary | ICD-10-CM | POA: Diagnosis not present

## 2021-03-06 DIAGNOSIS — E538 Deficiency of other specified B group vitamins: Secondary | ICD-10-CM | POA: Diagnosis not present

## 2021-03-06 DIAGNOSIS — R1311 Dysphagia, oral phase: Secondary | ICD-10-CM | POA: Diagnosis not present

## 2021-03-06 DIAGNOSIS — G959 Disease of spinal cord, unspecified: Secondary | ICD-10-CM | POA: Diagnosis not present

## 2021-03-07 DIAGNOSIS — G20 Parkinson's disease: Secondary | ICD-10-CM | POA: Diagnosis not present

## 2021-03-07 DIAGNOSIS — D692 Other nonthrombocytopenic purpura: Secondary | ICD-10-CM | POA: Diagnosis not present

## 2021-03-07 DIAGNOSIS — M4805 Spinal stenosis, thoracolumbar region: Secondary | ICD-10-CM | POA: Diagnosis not present

## 2021-03-07 DIAGNOSIS — I110 Hypertensive heart disease with heart failure: Secondary | ICD-10-CM | POA: Diagnosis not present

## 2021-03-07 DIAGNOSIS — I5030 Unspecified diastolic (congestive) heart failure: Secondary | ICD-10-CM | POA: Diagnosis not present

## 2021-03-07 DIAGNOSIS — J449 Chronic obstructive pulmonary disease, unspecified: Secondary | ICD-10-CM | POA: Diagnosis not present

## 2021-03-07 DIAGNOSIS — E538 Deficiency of other specified B group vitamins: Secondary | ICD-10-CM | POA: Diagnosis not present

## 2021-03-07 DIAGNOSIS — R1311 Dysphagia, oral phase: Secondary | ICD-10-CM | POA: Diagnosis not present

## 2021-03-07 DIAGNOSIS — Z9181 History of falling: Secondary | ICD-10-CM | POA: Diagnosis not present

## 2021-03-07 DIAGNOSIS — E559 Vitamin D deficiency, unspecified: Secondary | ICD-10-CM | POA: Diagnosis not present

## 2021-03-07 DIAGNOSIS — G959 Disease of spinal cord, unspecified: Secondary | ICD-10-CM | POA: Diagnosis not present

## 2021-03-07 DIAGNOSIS — I2510 Atherosclerotic heart disease of native coronary artery without angina pectoris: Secondary | ICD-10-CM | POA: Diagnosis not present

## 2021-03-07 DIAGNOSIS — M4802 Spinal stenosis, cervical region: Secondary | ICD-10-CM | POA: Diagnosis not present

## 2021-03-08 DIAGNOSIS — G959 Disease of spinal cord, unspecified: Secondary | ICD-10-CM | POA: Diagnosis not present

## 2021-03-08 DIAGNOSIS — I2510 Atherosclerotic heart disease of native coronary artery without angina pectoris: Secondary | ICD-10-CM | POA: Diagnosis not present

## 2021-03-08 DIAGNOSIS — I5030 Unspecified diastolic (congestive) heart failure: Secondary | ICD-10-CM | POA: Diagnosis not present

## 2021-03-08 DIAGNOSIS — D692 Other nonthrombocytopenic purpura: Secondary | ICD-10-CM | POA: Diagnosis not present

## 2021-03-08 DIAGNOSIS — M4802 Spinal stenosis, cervical region: Secondary | ICD-10-CM | POA: Diagnosis not present

## 2021-03-08 DIAGNOSIS — I110 Hypertensive heart disease with heart failure: Secondary | ICD-10-CM | POA: Diagnosis not present

## 2021-03-08 DIAGNOSIS — E559 Vitamin D deficiency, unspecified: Secondary | ICD-10-CM | POA: Diagnosis not present

## 2021-03-08 DIAGNOSIS — J449 Chronic obstructive pulmonary disease, unspecified: Secondary | ICD-10-CM | POA: Diagnosis not present

## 2021-03-08 DIAGNOSIS — M4805 Spinal stenosis, thoracolumbar region: Secondary | ICD-10-CM | POA: Diagnosis not present

## 2021-03-08 DIAGNOSIS — G20 Parkinson's disease: Secondary | ICD-10-CM | POA: Diagnosis not present

## 2021-03-08 DIAGNOSIS — E538 Deficiency of other specified B group vitamins: Secondary | ICD-10-CM | POA: Diagnosis not present

## 2021-03-08 DIAGNOSIS — R1311 Dysphagia, oral phase: Secondary | ICD-10-CM | POA: Diagnosis not present

## 2021-03-08 DIAGNOSIS — Z9181 History of falling: Secondary | ICD-10-CM | POA: Diagnosis not present

## 2021-03-09 DIAGNOSIS — I110 Hypertensive heart disease with heart failure: Secondary | ICD-10-CM | POA: Diagnosis not present

## 2021-03-09 DIAGNOSIS — G959 Disease of spinal cord, unspecified: Secondary | ICD-10-CM | POA: Diagnosis not present

## 2021-03-09 DIAGNOSIS — M4802 Spinal stenosis, cervical region: Secondary | ICD-10-CM | POA: Diagnosis not present

## 2021-03-09 DIAGNOSIS — E559 Vitamin D deficiency, unspecified: Secondary | ICD-10-CM | POA: Diagnosis not present

## 2021-03-09 DIAGNOSIS — R1311 Dysphagia, oral phase: Secondary | ICD-10-CM | POA: Diagnosis not present

## 2021-03-09 DIAGNOSIS — Z9181 History of falling: Secondary | ICD-10-CM | POA: Diagnosis not present

## 2021-03-09 DIAGNOSIS — D692 Other nonthrombocytopenic purpura: Secondary | ICD-10-CM | POA: Diagnosis not present

## 2021-03-09 DIAGNOSIS — I5030 Unspecified diastolic (congestive) heart failure: Secondary | ICD-10-CM | POA: Diagnosis not present

## 2021-03-09 DIAGNOSIS — J449 Chronic obstructive pulmonary disease, unspecified: Secondary | ICD-10-CM | POA: Diagnosis not present

## 2021-03-09 DIAGNOSIS — M4805 Spinal stenosis, thoracolumbar region: Secondary | ICD-10-CM | POA: Diagnosis not present

## 2021-03-09 DIAGNOSIS — G20 Parkinson's disease: Secondary | ICD-10-CM | POA: Diagnosis not present

## 2021-03-09 DIAGNOSIS — I2510 Atherosclerotic heart disease of native coronary artery without angina pectoris: Secondary | ICD-10-CM | POA: Diagnosis not present

## 2021-03-09 DIAGNOSIS — E538 Deficiency of other specified B group vitamins: Secondary | ICD-10-CM | POA: Diagnosis not present

## 2021-03-10 DIAGNOSIS — M79642 Pain in left hand: Secondary | ICD-10-CM | POA: Diagnosis not present

## 2021-03-12 DIAGNOSIS — I2510 Atherosclerotic heart disease of native coronary artery without angina pectoris: Secondary | ICD-10-CM | POA: Diagnosis not present

## 2021-03-12 DIAGNOSIS — E559 Vitamin D deficiency, unspecified: Secondary | ICD-10-CM | POA: Diagnosis not present

## 2021-03-12 DIAGNOSIS — J449 Chronic obstructive pulmonary disease, unspecified: Secondary | ICD-10-CM | POA: Diagnosis not present

## 2021-03-12 DIAGNOSIS — R1311 Dysphagia, oral phase: Secondary | ICD-10-CM | POA: Diagnosis not present

## 2021-03-12 DIAGNOSIS — Z9181 History of falling: Secondary | ICD-10-CM | POA: Diagnosis not present

## 2021-03-12 DIAGNOSIS — M4802 Spinal stenosis, cervical region: Secondary | ICD-10-CM | POA: Diagnosis not present

## 2021-03-12 DIAGNOSIS — D692 Other nonthrombocytopenic purpura: Secondary | ICD-10-CM | POA: Diagnosis not present

## 2021-03-12 DIAGNOSIS — G20 Parkinson's disease: Secondary | ICD-10-CM | POA: Diagnosis not present

## 2021-03-12 DIAGNOSIS — E538 Deficiency of other specified B group vitamins: Secondary | ICD-10-CM | POA: Diagnosis not present

## 2021-03-12 DIAGNOSIS — I5030 Unspecified diastolic (congestive) heart failure: Secondary | ICD-10-CM | POA: Diagnosis not present

## 2021-03-12 DIAGNOSIS — G959 Disease of spinal cord, unspecified: Secondary | ICD-10-CM | POA: Diagnosis not present

## 2021-03-12 DIAGNOSIS — I110 Hypertensive heart disease with heart failure: Secondary | ICD-10-CM | POA: Diagnosis not present

## 2021-03-12 DIAGNOSIS — M4805 Spinal stenosis, thoracolumbar region: Secondary | ICD-10-CM | POA: Diagnosis not present

## 2021-03-13 DIAGNOSIS — I110 Hypertensive heart disease with heart failure: Secondary | ICD-10-CM | POA: Diagnosis not present

## 2021-03-13 DIAGNOSIS — M1990 Unspecified osteoarthritis, unspecified site: Secondary | ICD-10-CM | POA: Diagnosis not present

## 2021-03-13 DIAGNOSIS — I509 Heart failure, unspecified: Secondary | ICD-10-CM | POA: Diagnosis not present

## 2021-03-13 DIAGNOSIS — R1311 Dysphagia, oral phase: Secondary | ICD-10-CM | POA: Diagnosis not present

## 2021-03-21 ENCOUNTER — Ambulatory Visit: Payer: Medicare Other | Admitting: Sports Medicine

## 2021-03-21 ENCOUNTER — Encounter: Payer: Self-pay | Admitting: Sports Medicine

## 2021-03-21 ENCOUNTER — Other Ambulatory Visit: Payer: Self-pay

## 2021-03-21 DIAGNOSIS — M79674 Pain in right toe(s): Secondary | ICD-10-CM | POA: Diagnosis not present

## 2021-03-21 DIAGNOSIS — M79675 Pain in left toe(s): Secondary | ICD-10-CM

## 2021-03-21 DIAGNOSIS — G20 Parkinson's disease: Secondary | ICD-10-CM

## 2021-03-21 DIAGNOSIS — Z8673 Personal history of transient ischemic attack (TIA), and cerebral infarction without residual deficits: Secondary | ICD-10-CM | POA: Diagnosis not present

## 2021-03-21 DIAGNOSIS — I739 Peripheral vascular disease, unspecified: Secondary | ICD-10-CM | POA: Diagnosis not present

## 2021-03-21 DIAGNOSIS — B351 Tinea unguium: Secondary | ICD-10-CM

## 2021-03-21 NOTE — Progress Notes (Signed)
Subjective: Travis Reese is a 75 y.o. male patient seen today in office with complaint of mildly painful thickened and elongated toenails; unable to trim. Patient denies history of Diabetes or neuropathy but has a history of vascular disease and Parkinson's. Patient has no other pedal complaints at this time.   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   Tremor    Weakness 12/15/2020   S/P aortic valve replacement with bioprosthetic valve 05/01/2018   HTN (hypertension) 04/19/2018   Chronic diastolic CHF (congestive heart failure) ([place]) 04/19/2018   BPH (benign prostatic hyperplasia) 04/19/2018   Anxiety 04/19/2018   Cellulitis of face 10/15/2016   Mixed hyperlipidemia 09/23/2016   Other fatigue 07/23/2016   Recurrent sinusitis 07/23/2016   Other emphysema ([place]) 07/11/2016   Restless leg syndrome 07/10/2016   Plantar wart of left foot 01/05/2016   Prolapsed internal hemorrhoids, grade 3 04/19/2015    Current Outpatient Medications on File Prior to Visit  Medication Sig Dispense Refill   acetaminophen (TYLENOL) 325 MG tablet Take 2 tablets (650 mg total) by mouth every 6 (six) hours as needed for mild pain (or Fever >/= 101).     albuterol (VENTOLIN HFA) 108 (90 Base) MCG/ACT inhaler Inhale 2 puffs into the lungs every 6 (six) hours as needed for wheezing or shortness of breath.     aspirin EC 325 MG EC tablet Take 1 tablet (325 mg total) by mouth daily. 30 tablet 0   carbidopa-levodopa (SINEMET IR) 25-100 MG tablet Take 1/2 tablet at 7am/11am/4pm x 1 week, Then take 1/2 tablet at 7am,  1/2 tablet at 11am, 1 tablet at 4pm, at least 30 minutes before meals x 1 week, then take 1/2 tablet at 7am, 1 tablet at 11am, 1 at 4pm, at least 30 minutes before meals,  x 1 week, then 1 po at 7am/11am/4pm 270 tablet 1   carvedilol (COREG) 25 MG tablet Take 1 tablet (25 mg total) by mouth 2 (two) times daily with a meal. Must keep scheduled appointment for further refills 180 tablet 0   celecoxib (CELEBREX) 100 MG capsule Take 100 mg by mouth every morning.     clonazePAM (KLONOPIN) 1 MG tablet Take 1 tablet (1 mg total) by mouth at bedtime. 10 tablet 0   cyanocobalamin (,VITAMIN B-12,) 1000 MCG/ML injection Inject 1 mL (1,000 mcg total) into the muscle every 30 (thirty) days for 10 doses. 10 mL 0   DULoxetine (CYMBALTA) 30 MG capsule Take 30 mg by mouth daily.     feeding supplement (ENSURE ENLIVE / ENSURE PLUS) LIQD Take 237 mLs by mouth 3 (three) times daily between meals. (Patient not taking: Reported on 02/23/2021) 237 mL 12   fluticasone (FLONASE) 50 MCG/ACT nasal spray Place 1 spray into both nostrils at bedtime.     Krill Oil 1000 MG CAPS Take 1,000 mg by mouth at bedtime.     Melatonin 10 MG CAPS Take 10 mg by mouth at bedtime.     montelukast (SINGULAIR) 10 MG tablet Take 10 mg by mouth at bedtime.     primidone (MYSOLINE) 50 MG tablet Take 1 tablet (50 mg total) by mouth at bedtime. 90 tablet 1   spironolactone (ALDACTONE) 25 MG tablet Take 0.5 tablets (12.5 mg total)  by mouth daily. NEEDS AN APPOINTMENT FOR FURTHER REFILLS (Patient taking differently: Take 12.5 mg by mouth daily.) 15 tablet 0   Syringe/Needle, Disp, (SYRINGE 3CC/18GX1-1/2") 18G X 1-1/2" 3 ML MISC Use for B12 IM injections 10 each 0   tamsulosin (FLOMAX) 0.4 MG CAPS capsule Take 0.4 mg by mouth 2 (two) times daily after a meal.     Vitamin D, Ergocalciferol, (DRISDOL) 1.25 MG (50000 UT) CAPS capsule Take 50,000 Units by mouth every Monday.     Zinc 50 MG TABS Take 50 mg by mouth at bedtime.     No current facility-administered medications on file prior to visit.    No Known Allergies  Objective: Physical Exam  General: Well developed, nourished, no acute distress, awake, alert and  oriented x 3  Vascular: Dorsalis pedis artery 1/4 bilateral, Posterior tibial artery 0/4 bilateral due to pitting edema at ankles 1+, skin temperature warm to warm proximal to distal bilateral lower extremities, moderate varicosities, scant pedal hair present bilateral.  Neurological: Gross sensation present via light touch bilateral.   Dermatological: Skin is warm, dry, and supple bilateral, Nails 1-10 are tender, long, thick, and discolored with mild subungal debris, no webspace macerations present bilateral, no open lesions present bilateral, no callus/corns/hyperkeratotic tissue present bilateral. No signs of infection bilateral.  Musculoskeletal: No symptomatic boney deformities noted bilateral. Muscular strength within normal limits without painon range of motion. No pain with calf compression bilateral.  Assessment and Plan:  Problem List Items Addressed This Visit       Nervous and Auditory   Parkinson's disease ([place])     Other   History of CVA (cerebrovascular accident)   Other Visit Diagnoses     Pain due to onychomycosis of toenails of both feet    -  Primary   PVD (peripheral vascular disease) ([place])           -Examined patient.  -Discussed treatment options for painful mycotic nails. -Mechanically debrided and reduced mycotic nails with sterile nail nipper and dremel nail file without incident. -Patient to return in 3 months for follow up evaluation or sooner if symptoms worsen.  Landis Martins, DPM

## 2021-04-02 DIAGNOSIS — I5032 Chronic diastolic (congestive) heart failure: Secondary | ICD-10-CM | POA: Insufficient documentation

## 2021-04-02 DIAGNOSIS — G5620 Lesion of ulnar nerve, unspecified upper limb: Secondary | ICD-10-CM | POA: Insufficient documentation

## 2021-04-02 DIAGNOSIS — I509 Heart failure, unspecified: Secondary | ICD-10-CM | POA: Insufficient documentation

## 2021-04-02 DIAGNOSIS — I110 Hypertensive heart disease with heart failure: Secondary | ICD-10-CM | POA: Insufficient documentation

## 2021-04-02 DIAGNOSIS — M5030 Other cervical disc degeneration, unspecified cervical region: Secondary | ICD-10-CM | POA: Insufficient documentation

## 2021-04-02 DIAGNOSIS — I358 Other nonrheumatic aortic valve disorders: Secondary | ICD-10-CM | POA: Insufficient documentation

## 2021-04-02 DIAGNOSIS — M419 Scoliosis, unspecified: Secondary | ICD-10-CM | POA: Insufficient documentation

## 2021-04-02 DIAGNOSIS — J309 Allergic rhinitis, unspecified: Secondary | ICD-10-CM | POA: Insufficient documentation

## 2021-04-02 DIAGNOSIS — M1990 Unspecified osteoarthritis, unspecified site: Secondary | ICD-10-CM | POA: Insufficient documentation

## 2021-04-02 DIAGNOSIS — K623 Rectal prolapse: Secondary | ICD-10-CM | POA: Insufficient documentation

## 2021-04-02 DIAGNOSIS — E559 Vitamin D deficiency, unspecified: Secondary | ICD-10-CM | POA: Insufficient documentation

## 2021-04-02 DIAGNOSIS — R972 Elevated prostate specific antigen [PSA]: Secondary | ICD-10-CM | POA: Insufficient documentation

## 2021-04-02 DIAGNOSIS — I77810 Thoracic aortic ectasia: Secondary | ICD-10-CM | POA: Insufficient documentation

## 2021-04-02 DIAGNOSIS — Z974 Presence of external hearing-aid: Secondary | ICD-10-CM | POA: Insufficient documentation

## 2021-04-02 HISTORY — DX: Hypertensive heart disease with heart failure: I11.0

## 2021-04-06 ENCOUNTER — Other Ambulatory Visit: Payer: Self-pay

## 2021-04-06 ENCOUNTER — Telehealth: Payer: Self-pay | Admitting: Neurology

## 2021-04-06 MED ORDER — CARBIDOPA-LEVODOPA 25-100 MG PO TABS: ORAL_TABLET | ORAL | 0 refills | Status: DC

## 2021-04-06 NOTE — Telephone Encounter (Signed)
Calling to see about his parkinsons meds. Said he needs a refill and would like to see about auto [facility]ill [facility]

## 2021-04-06 NOTE — Telephone Encounter (Signed)
[facility] to last until 11/22 appointment

## 2021-04-09 ENCOUNTER — Encounter: Payer: Self-pay | Admitting: Cardiology

## 2021-04-09 ENCOUNTER — Other Ambulatory Visit: Payer: Self-pay

## 2021-04-09 ENCOUNTER — Ambulatory Visit (INDEPENDENT_AMBULATORY_CARE_PROVIDER_SITE_OTHER): Payer: Medicare Other | Admitting: Cardiology

## 2021-04-09 ENCOUNTER — Ambulatory Visit (INDEPENDENT_AMBULATORY_CARE_PROVIDER_SITE_OTHER): Payer: Medicare Other

## 2021-04-09 VITALS — BP 102/58 | HR 71 | Ht 73.0 in | Wt 187.0 lb

## 2021-04-09 DIAGNOSIS — I5032 Chronic diastolic (congestive) heart failure: Secondary | ICD-10-CM | POA: Diagnosis not present

## 2021-04-09 DIAGNOSIS — G20 Parkinson's disease: Secondary | ICD-10-CM | POA: Diagnosis not present

## 2021-04-09 DIAGNOSIS — Z8673 Personal history of transient ischemic attack (TIA), and cerebral infarction without residual deficits: Secondary | ICD-10-CM | POA: Diagnosis not present

## 2021-04-09 DIAGNOSIS — Z953 Presence of xenogenic heart valve: Secondary | ICD-10-CM

## 2021-04-09 NOTE — Patient Instructions (Signed)
Medication Instructions:  Your physician recommends that you continue on your current medications as directed. Please refer to the Current Medication list given to you today.  *If you need a refill on your cardiac medications before your next appointment, please call your pharmacy*   Lab Work: Your physician recommends that you return for lab work in:  TODAY: BMET, CBC If you have labs (blood work) drawn today and your tests are completely normal, you will receive your results only by: MyChart Message (if you have [place]) OR A paper copy in the mail If you have any lab test that is abnormal or we need to change your treatment, we will call you to review the results.   Testing/Procedures: You are scheduled for a TEE on 04/13/2021 with Dr. Harrell Gave.  Please arrive at the [facility] (Main Entrance A) at [facility]: [address] [place], [place] [address] at 10 am/pm. (1 hour prior to procedure unless lab work is needed; if lab work is needed arrive 1.5 hours ahead)  DIET: Nothing to eat or drink after midnight except a sip of water with medications (see medication instructions below)  FYI: For your safety, and to allow [place] to monitor your vital signs accurately during the surgery/procedure we request that   if you have artificial nails, gel coating, SNS etc. Please have those removed prior to your surgery/procedure. Not having the nail coverings /polish removed may result in cancellation or delay of your surgery/procedure.   Medication Instructions: Continue your anticoagulant: Aspirin You will need to continue your anticoagulant after your procedure until you  are told by your provider that it is safe to stop   You must have a responsible person to drive you home and stay in the waiting area during your procedure. Failure to do so could result in cancellation.  Bring your insurance cards.  *Special Note: Every effort is made to have your procedure done on time.  Occasionally there are emergencies that occur at the hospital that may cause delays. Please be patient if a delay does occur.   A zio monitor was ordered today. It will remain on for 14 days. You will then return monitor and event diary in provided box. It takes 1-2 weeks for report to be downloaded and returned to [place]. We will call you with the results. If monitor falls off or has orange flashing light, please call Zio for further instructions.   Follow-Up: At [facility], you and your health needs are our priority.  As part of our continuing mission to provide you with exceptional heart care, we have created designated Provider Care Teams.  These Care Teams include your primary Cardiologist (physician) and Advanced Practice Providers (APPs -  Physician Assistants and Nurse Practitioners) who all work together to provide you with the care you need, when you need it.  We recommend signing up for the patient portal called "MyChart".  Sign up information is provided on this After Visit Summary.  MyChart is used to connect with patients for Virtual Visits (Telemedicine).  Patients are able to view lab/test results, encounter notes, upcoming appointments, etc.  Non-urgent messages can be sent to your provider as well.   To learn more about what you can do with MyChart, go to [email or web address].    Your next appointment:   2-3  month(s)  The format for your next appointment:   In Person  Provider:   Jenne Campus, MD   Other Instructions [place]  PRESCRIBING ZIO? The Zio system is proven and trusted by physicians to detect and diagnose irregular heart rhythms -- and has been prescribed to hundreds of thousands of patients.  The FDA has cleared the Zio system to monitor for many different kinds of irregular heart rhythms. In a study, physicians were able to reach a diagnosis 90% of the time with the Zio system1.  You can wear the Zio monitor -- a small, discreet,  comfortable patch -- during your normal day-to-day activity, including while you sleep, shower, and exercise, while it records every single heartbeat for analysis.  1Barrett, P., et al. Comparison of 24 Hour Holter Monitoring Versus 14 Day Novel Adhesive Patch Electrocardiographic Monitoring. [place], 2014.  ZIO VS. HOLTER MONITORING The Zio monitor can be comfortably worn for up to 14 days. Holter monitors can be worn for 24 to 48 hours, limiting the time to record any irregular heart rhythms you may have. Zio is able to capture data for the 51% of patients who have their first symptom-triggered arrhythmia after 48 hours.1  LIVE WITHOUT RESTRICTIONS The Zio ambulatory cardiac monitor is a small, unobtrusive, and water-resistant patch--you might even forget you're wearing it. The Zio monitor records and stores every beat of your heart, whether you're sleeping, working out, or showering. Remove on: Aug 22nd 2022

## 2021-04-09 NOTE — Progress Notes (Signed)
Cardiology Office Note:    Date:  04/09/2021   ID:  [name], DOB 1946-08-27, MRN [id number]  PCP:  Janine Limbo, PA-C  Cardiologist:  Jenne Campus, MD    Referring MD: Janine Limbo, PA-C   Chief Complaint  Patient presents with   Follow-up    2 strokes this year     History of Present Illness:    Travis Reese is a 75 y.o. 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  His past medical history is also significant for Parkinson which is related to medications, essential hypertension, history of CVA.  Recently he ended going to the hospital because some nonspecific symptoms he was find to have white matter disease on the CT/MRI, there was also some suspicion for stroke.  Echocardiogram has been performed which showed some possibility of PFO.  He was sent to [place] for future evaluation of this problem.  Recently seen by neurology and they recommended TEE. Overall he seems to be doing well.  Denies have any chest pain tightness squeezing pressure burning chest no palpitations.  He is Parkinson overall getting better.  Past Medical History:  Diagnosis Date   Allergic rhinosinusitis    Anxiety    Aortic valve sclerosis    Arthritis    knees, neck, shoulder   CHF (congestive heart failure) (HCC)    Cubital tunnel syndrome    DDD (degenerative disc disease), cervical    Depression    Elevated PSA    denies per patient   HTN (hypertension)    Hyperlipidemia    Hyperplasia of prostate with lower urinary tract symptoms (LUTS)    Hypertension    Mild dilation of ascending aorta (HCC)     Parkinson disease (HCC)    Prolapsed internal hemorrhoids, grade 3 04/19/2015   Rectal prolapse    Scoliosis of lumbosacral spine    Stroke ([place])    x2   Tremor    Vitamin D deficiency    Wears hearing aid    bilateral    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   BENTALL PROCEDURE N/A 05/01/2018   Procedure: BIOLOGICAL BENTALL PROCEDURE AORTIC ROOT REPLACEMENT WITH 30MM VALSALVA GRAFT & 27MM INSPIRIS VALVE.;  Surgeon: Rexene Alberts, MD;  Location: [place];  Service: Open Heart Surgery;  Laterality: N/A;   CATARACT EXTRACTION  04/2018   COLONOSCOPY  2007   NASAL SINUS SURGERY  2006   RIGHT/LEFT HEART CATH AND CORONARY ANGIOGRAPHY N/A 04/27/2018   Procedure: RIGHT/LEFT HEART CATH AND CORONARY ANGIOGRAPHY;  Surgeon: Jolaine Artist, MD;  Location: [place] [facility];  Service: Cardiovascular;  Laterality: N/A;   TEE WITHOUT CARDIOVERSION N/A 04/27/2018   Procedure: TRANSESOPHAGEAL ECHOCARDIOGRAM (TEE);  Surgeon: Jolaine Artist, MD;  Location: [facility];  Service: Cardiovascular;  Laterality: N/A;   TEE WITHOUT CARDIOVERSION N/A 05/01/2018   Procedure: TRANSESOPHAGEAL ECHOCARDIOGRAM (TEE);  Surgeon: Rexene Alberts, MD;  Location: [place];  Service: Open Heart Surgery;  Laterality: N/A;   TRANSANAL HEMORRHOIDAL DEARTERIALIZATION N/A 11/15/2015   Procedure: TRANSANAL HEMORRHOIDAL DEARTERIALIZATION;  Surgeon: Leighton Ruff, MD;  Location: [facility]  [facility];  Service: General;  Laterality: N/A;    Current Medications: Current Meds  Medication Sig   acetaminophen (TYLENOL) 325 MG tablet Take 2 tablets (650 mg total) by mouth every 6 (six) hours as needed for mild pain (or Fever >/= 101).   albuterol (VENTOLIN HFA) 108 (90 Base) MCG/ACT inhaler Inhale 2 puffs into the lungs every 6 (six) hours as needed for wheezing or shortness of breath.   aspirin EC 325 MG EC tablet Take 1 tablet (325 mg total) by mouth daily.    carbidopa-levodopa (SINEMET IR) 25-100 MG tablet Take 1/2 tablet at 7am/11am/4pm x 1 week, Then take 1/2 tablet at 7am,  1/2 tablet at 11am, 1 tablet at 4pm, at least 30 minutes before meals x 1 week, then take 1/2 tablet at 7am, 1 tablet at 11am, 1 at 4pm, at least 30 minutes before meals, x 1 week, then 1 po at 7am/11am/4pm (Patient taking differently: Take 1 tablet by mouth 3 (three) times daily. 1 po at 7am/11am/4pm)   carvedilol (COREG) 25 MG tablet Take 1 tablet (25 mg total) by mouth 2 (two) times daily with a meal. Must keep scheduled appointment for further refills   celecoxib (CELEBREX) 100 MG capsule Take 100 mg by mouth every morning.   clonazePAM (KLONOPIN) 1 MG tablet Take 1 tablet (1 mg total) by mouth at bedtime.   fluticasone (FLONASE) 50 MCG/ACT nasal spray Place 1 spray into both nostrils at bedtime.   Krill Oil 1000 MG CAPS Take 1,000 mg by mouth at bedtime.   Melatonin 10 MG CAPS Take 10 mg by mouth at bedtime.   montelukast (SINGULAIR) 10 MG tablet Take 10 mg by mouth at bedtime.   primidone (MYSOLINE) 50 MG tablet Take 1 tablet (50 mg total) by mouth at bedtime.   spironolactone (ALDACTONE) 25 MG tablet Take 0.5 tablets (12.5 mg total) by mouth daily. NEEDS AN APPOINTMENT FOR FURTHER REFILLS (Patient taking differently: Take 12.5 mg by mouth daily.)   tamsulosin (FLOMAX) 0.4 MG CAPS capsule Take 0.4 mg by mouth 2 (two) times daily after a meal.   Vitamin D, Ergocalciferol, (DRISDOL) 1.25 MG (50000 UT) CAPS capsule Take 50,000 Units by mouth every Monday.   Zinc 50 MG TABS Take 50 mg by mouth at bedtime.     Allergies:   Patient has no known allergies.   Social History   Socioeconomic History   Marital status: Single    Spouse name: Not on file   Number of children: 2   Years of education: Not on file   Highest education level: Not on file  Occupational History   Occupation: retired    Comment: security guard  Tobacco Use   Smoking status: Former    Packs/day: 0.50     Years: 50.00    Pack years: 25.00    Types: Cigarettes    Quit date: 05/2018    Years since quitting: 2.9   Smokeless tobacco: Never  Vaping Use   Vaping Use: Former  Substance and Sexual Activity   Alcohol use: Not Currently    Comment: Occasional   Drug use: No   Sexual activity: Not Currently  Other Topics Concern   Not on file  Social History Narrative   Divorced lives with father after incarceration x 5 yrs   1 son, 1 daughter   Volunteer work   3 caffeine/day   04/19/2015   Right handed   Social Determinants of Health   Financial Resource Strain: Not  on file  Food Insecurity: Not on file  Transportation Needs: Not on file  Physical Activity: Not on file  Stress: Not on file  Social Connections: Not on file     Family History: The patient's family history includes Breast cancer in his sister; COPD in his mother; Colon polyps in his father; Heart attack in his mother; Hyperlipidemia in his father; Hypertension in his brother and sister; Parkinson's disease in his father; Prostate cancer in his father and paternal uncle. ROS:   Please see the history of present illness.    All 14 point review of systems negative except as described per history of present illness  EKGs/Labs/Other Studies Reviewed:    Echocardiogram from 21 Jan 2021 showed:   1. Left ventricular ejection fraction, by estimation, is 55 to 60%. The  left ventricle has normal function. The left ventricle has no regional  wall motion abnormalities. Left ventricular diastolic parameters are  consistent with Grade I diastolic  dysfunction (impaired relaxation).   2. Right ventricular systolic function is normal. The right ventricular  size is normal.   3. Left atrial size was mildly dilated.   4. Saline microbubbles were seen in the left ventricle, however, I cannot  determine if it was within 5 beats - this was after valsalva. cannot  exclude PFO vs late intrapulmonary shunting.   5. The mitral  valve is grossly normal. No evidence of mitral valve  regurgitation.   6. The aortic valve is tricuspid. Aortic valve regurgitation is not  visualized.   Recent Labs: 01/20/2021: ALT 10 01/21/2021: TSH 2.294 01/22/2021: BUN 14; Creatinine, Ser 0.63; Hemoglobin 12.7; Magnesium 1.8; Platelets 158; Potassium 4.0; Sodium 131  Recent Lipid Panel    Component Value Date/Time   CHOL 110 04/29/2018 0500   TRIG 29 04/29/2018 0500   HDL 51 04/29/2018 0500   CHOLHDL 2.2 04/29/2018 0500   VLDL 6 04/29/2018 0500   LDLCALC 53 04/29/2018 0500    Physical Exam:    VS:  BP (!) 102/58 (BP Location: Left Arm, Patient Position: Sitting)   Pulse 71   Ht '6\' 1"'$  (1.854 m)   Wt 187 lb (84.8 kg)   SpO2 92%   BMI 24.67 kg/m     Wt Readings from Last 3 Encounters:  04/09/21 187 lb (84.8 kg)  02/23/21 182 lb (82.6 kg)  01/19/21 186 lb 15.9 oz (84.8 kg)     GEN:  Well nourished, well developed in no acute distress HEENT: Normal NECK: No JVD; No carotid bruits LYMPHATICS: No lymphadenopathy CARDIAC: RRR, no murmurs, no rubs, no gallops RESPIRATORY:  Clear to auscultation without rales, wheezing or rhonchi  ABDOMEN: Soft, non-tender, non-distended MUSCULOSKELETAL:  No edema; No deformity  SKIN: Warm and dry LOWER EXTREMITIES: no swelling NEUROLOGIC:  Alert and oriented x 3 PSYCHIATRIC:  Normal affect   ASSESSMENT:    1. S/P aortic valve replacement with bioprosthetic valve   2. History of CVA (cerebrovascular accident)   3. Parkinson's disease ([place])   4. Chronic diastolic CHF (congestive heart failure) (HCC)    PLAN:    In order of problems listed above:  Status post aortic valve replacement bioprosthetic aortic valve, overall he seems to be doing well from that point review last echocardiogram reviewed showed normal function of the valve.  However there is some question about potentially PFO on this is one of the reason why he is in my office.  Recently he was identified to have some CVA he  does have  some white matter disease which should be managed by proper management of hypertension dyslipidemia as well as factors modification, however there was some additional areas of suspicion for superacute stroke for dose embolic causes should be ruled out.  He did see neurologist and they recommended TEE as well as heart monitoring to rule out atrial fibrillation.  I explained TEE to him as well to his wife.  He did have it done before so he knows exactly what the procedure is I did discussed potential complications including esophageal injury and he agreed to proceed.  I will also ask him to wear Zio patch for 2 weeks to see if he got any significant arrhythmia. History of CVA: Plan as described above Parkinson disease which is tracking things.  Much better right now.  He complained of having problems still some Chronic diastolic congestive heart therapies to be compensated.   Medication Adjustments/Labs and Tests Ordered: Current medicines are reviewed at length with the patient today.  Concerns regarding medicines are outlined above.  No orders of the defined types were placed in this encounter.  Medication changes: No orders of the defined types were placed in this encounter.   Signed, Travis Liter, MD, [facility] 04/09/2021 10:35 AM    [place]

## 2021-04-09 NOTE — H&P (View-Only) (Signed)
Cardiology Office Note:    Date:  04/09/2021   ID:  [name], DOB 1946-02-14, MRN [id number]  PCP:  Janine Limbo, PA-C  Cardiologist:  Jenne Campus, MD    Referring MD: Janine Limbo, PA-C   Chief Complaint  Patient presents with   Follow-up    2 strokes this year     History of Present Illness:    Travis Reese is a 75 y.o. 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  His past medical history is also significant for Parkinson which is related to medications, essential hypertension, history of CVA.  Recently he ended going to the hospital because some nonspecific symptoms he was find to have white matter disease on the CT/MRI, there was also some suspicion for stroke.  Echocardiogram has been performed which showed some possibility of PFO.  He was sent to [place] for future evaluation of this problem.  Recently seen by neurology and they recommended TEE. Overall he seems to be doing well.  Denies have any chest pain tightness squeezing pressure burning chest no palpitations.  He is Parkinson overall getting better.  Past Medical History:  Diagnosis Date   Allergic rhinosinusitis    Anxiety    Aortic valve sclerosis    Arthritis    knees, neck, shoulder   CHF (congestive heart failure) (HCC)    Cubital tunnel syndrome    DDD (degenerative disc disease), cervical    Depression    Elevated PSA    denies per patient   HTN (hypertension)    Hyperlipidemia    Hyperplasia of prostate with lower urinary tract symptoms (LUTS)    Hypertension    Mild dilation of ascending aorta (HCC)     Parkinson disease (HCC)    Prolapsed internal hemorrhoids, grade 3 04/19/2015   Rectal prolapse    Scoliosis of lumbosacral spine    Stroke ([place])    x2   Tremor    Vitamin D deficiency    Wears hearing aid    bilateral    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   BENTALL PROCEDURE N/A 05/01/2018   Procedure: BIOLOGICAL BENTALL PROCEDURE AORTIC ROOT REPLACEMENT WITH 30MM VALSALVA GRAFT & 27MM INSPIRIS VALVE.;  Surgeon: Rexene Alberts, MD;  Location: [place];  Service: Open Heart Surgery;  Laterality: N/A;   CATARACT EXTRACTION  04/2018   COLONOSCOPY  2007   NASAL SINUS SURGERY  2006   RIGHT/LEFT HEART CATH AND CORONARY ANGIOGRAPHY N/A 04/27/2018   Procedure: RIGHT/LEFT HEART CATH AND CORONARY ANGIOGRAPHY;  Surgeon: Jolaine Artist, MD;  Location: [place] [facility];  Service: Cardiovascular;  Laterality: N/A;   TEE WITHOUT CARDIOVERSION N/A 04/27/2018   Procedure: TRANSESOPHAGEAL ECHOCARDIOGRAM (TEE);  Surgeon: Jolaine Artist, MD;  Location: [facility];  Service: Cardiovascular;  Laterality: N/A;   TEE WITHOUT CARDIOVERSION N/A 05/01/2018   Procedure: TRANSESOPHAGEAL ECHOCARDIOGRAM (TEE);  Surgeon: Rexene Alberts, MD;  Location: [place];  Service: Open Heart Surgery;  Laterality: N/A;   TRANSANAL HEMORRHOIDAL DEARTERIALIZATION N/A 11/15/2015   Procedure: TRANSANAL HEMORRHOIDAL DEARTERIALIZATION;  Surgeon: Leighton Ruff, MD;  Location: [place]  [facility];  Service: General;  Laterality: N/A;    Current Medications: Current Meds  Medication Sig   acetaminophen (TYLENOL) 325 MG tablet Take 2 tablets (650 mg total) by mouth every 6 (six) hours as needed for mild pain (or Fever >/= 101).   albuterol (VENTOLIN HFA) 108 (90 Base) MCG/ACT inhaler Inhale 2 puffs into the lungs every 6 (six) hours as needed for wheezing or shortness of breath.   aspirin EC 325 MG EC tablet Take 1 tablet (325 mg total) by mouth daily.    carbidopa-levodopa (SINEMET IR) 25-100 MG tablet Take 1/2 tablet at 7am/11am/4pm x 1 week, Then take 1/2 tablet at 7am,  1/2 tablet at 11am, 1 tablet at 4pm, at least 30 minutes before meals x 1 week, then take 1/2 tablet at 7am, 1 tablet at 11am, 1 at 4pm, at least 30 minutes before meals, x 1 week, then 1 po at 7am/11am/4pm (Patient taking differently: Take 1 tablet by mouth 3 (three) times daily. 1 po at 7am/11am/4pm)   carvedilol (COREG) 25 MG tablet Take 1 tablet (25 mg total) by mouth 2 (two) times daily with a meal. Must keep scheduled appointment for further refills   celecoxib (CELEBREX) 100 MG capsule Take 100 mg by mouth every morning.   clonazePAM (KLONOPIN) 1 MG tablet Take 1 tablet (1 mg total) by mouth at bedtime.   fluticasone (FLONASE) 50 MCG/ACT nasal spray Place 1 spray into both nostrils at bedtime.   Krill Oil 1000 MG CAPS Take 1,000 mg by mouth at bedtime.   Melatonin 10 MG CAPS Take 10 mg by mouth at bedtime.   montelukast (SINGULAIR) 10 MG tablet Take 10 mg by mouth at bedtime.   primidone (MYSOLINE) 50 MG tablet Take 1 tablet (50 mg total) by mouth at bedtime.   spironolactone (ALDACTONE) 25 MG tablet Take 0.5 tablets (12.5 mg total) by mouth daily. NEEDS AN APPOINTMENT FOR FURTHER REFILLS (Patient taking differently: Take 12.5 mg by mouth daily.)   tamsulosin (FLOMAX) 0.4 MG CAPS capsule Take 0.4 mg by mouth 2 (two) times daily after a meal.   Vitamin D, Ergocalciferol, (DRISDOL) 1.25 MG (50000 UT) CAPS capsule Take 50,000 Units by mouth every Monday.   Zinc 50 MG TABS Take 50 mg by mouth at bedtime.     Allergies:   Patient has no known allergies.   Social History   Socioeconomic History   Marital status: Single    Spouse name: Not on file   Number of children: 2   Years of education: Not on file   Highest education level: Not on file  Occupational History   Occupation: retired    Comment: security guard  Tobacco Use   Smoking status: Former    Packs/day: 0.50     Years: 50.00    Pack years: 25.00    Types: Cigarettes    Quit date: 05/2018    Years since quitting: 2.9   Smokeless tobacco: Never  Vaping Use   Vaping Use: Former  Substance and Sexual Activity   Alcohol use: Not Currently    Comment: Occasional   Drug use: No   Sexual activity: Not Currently  Other Topics Concern   Not on file  Social History Narrative   Divorced lives with father after incarceration x 5 yrs   1 son, 1 daughter   Volunteer work   3 caffeine/day   04/19/2015   Right handed   Social Determinants of Health   Financial Resource Strain: Not  on file  Food Insecurity: Not on file  Transportation Needs: Not on file  Physical Activity: Not on file  Stress: Not on file  Social Connections: Not on file     Family History: The patient's family history includes Breast cancer in his sister; COPD in his mother; Colon polyps in his father; Heart attack in his mother; Hyperlipidemia in his father; Hypertension in his brother and sister; Parkinson's disease in his father; Prostate cancer in his father and paternal uncle. ROS:   Please see the history of present illness.    All 14 point review of systems negative except as described per history of present illness  EKGs/Labs/Other Studies Reviewed:    Echocardiogram from 21 Jan 2021 showed:   1. Left ventricular ejection fraction, by estimation, is 55 to 60%. The  left ventricle has normal function. The left ventricle has no regional  wall motion abnormalities. Left ventricular diastolic parameters are  consistent with Grade I diastolic  dysfunction (impaired relaxation).   2. Right ventricular systolic function is normal. The right ventricular  size is normal.   3. Left atrial size was mildly dilated.   4. Saline microbubbles were seen in the left ventricle, however, I cannot  determine if it was within 5 beats - this was after valsalva. cannot  exclude PFO vs late intrapulmonary shunting.   5. The mitral  valve is grossly normal. No evidence of mitral valve  regurgitation.   6. The aortic valve is tricuspid. Aortic valve regurgitation is not  visualized.   Recent Labs: 01/20/2021: ALT 10 01/21/2021: TSH 2.294 01/22/2021: BUN 14; Creatinine, Ser 0.63; Hemoglobin 12.7; Magnesium 1.8; Platelets 158; Potassium 4.0; Sodium 131  Recent Lipid Panel    Component Value Date/Time   CHOL 110 04/29/2018 0500   TRIG 29 04/29/2018 0500   HDL 51 04/29/2018 0500   CHOLHDL 2.2 04/29/2018 0500   VLDL 6 04/29/2018 0500   LDLCALC 53 04/29/2018 0500    Physical Exam:    VS:  BP (!) 102/58 (BP Location: Left Arm, Patient Position: Sitting)   Pulse 71   Ht '6\' 1"'$  (1.854 m)   Wt 187 lb (84.8 kg)   SpO2 92%   BMI 24.67 kg/m     Wt Readings from Last 3 Encounters:  04/09/21 187 lb (84.8 kg)  02/23/21 182 lb (82.6 kg)  01/19/21 186 lb 15.9 oz (84.8 kg)     GEN:  Well nourished, well developed in no acute distress HEENT: Normal NECK: No JVD; No carotid bruits LYMPHATICS: No lymphadenopathy CARDIAC: RRR, no murmurs, no rubs, no gallops RESPIRATORY:  Clear to auscultation without rales, wheezing or rhonchi  ABDOMEN: Soft, non-tender, non-distended MUSCULOSKELETAL:  No edema; No deformity  SKIN: Warm and dry LOWER EXTREMITIES: no swelling NEUROLOGIC:  Alert and oriented x 3 PSYCHIATRIC:  Normal affect   ASSESSMENT:    1. S/P aortic valve replacement with bioprosthetic valve   2. History of CVA (cerebrovascular accident)   3. Parkinson's disease ([place])   4. Chronic diastolic CHF (congestive heart failure) (HCC)    PLAN:    In order of problems listed above:  Status post aortic valve replacement bioprosthetic aortic valve, overall he seems to be doing well from that point review last echocardiogram reviewed showed normal function of the valve.  However there is some question about potentially PFO on this is one of the reason why he is in my office.  Recently he was identified to have some CVA he  does have  some white matter disease which should be managed by proper management of hypertension dyslipidemia as well as factors modification, however there was some additional areas of suspicion for superacute stroke for dose embolic causes should be ruled out.  He did see neurologist and they recommended TEE as well as heart monitoring to rule out atrial fibrillation.  I explained TEE to him as well to his wife.  He did have it done before so he knows exactly what the procedure is I did discussed potential complications including esophageal injury and he agreed to proceed.  I will also ask him to wear Zio patch for 2 weeks to see if he got any significant arrhythmia. History of CVA: Plan as described above Parkinson disease which is tracking things.  Much better right now.  He complained of having problems still some Chronic diastolic congestive heart therapies to be compensated.   Medication Adjustments/Labs and Tests Ordered: Current medicines are reviewed at length with the patient today.  Concerns regarding medicines are outlined above.  No orders of the defined types were placed in this encounter.  Medication changes: No orders of the defined types were placed in this encounter.   Signed, Park Liter, MD, [facility] 04/09/2021 10:35 AM    [place]

## 2021-04-10 ENCOUNTER — Ambulatory Visit (INDEPENDENT_AMBULATORY_CARE_PROVIDER_SITE_OTHER): Payer: Medicare Other | Admitting: Family Medicine

## 2021-04-10 ENCOUNTER — Encounter: Payer: Self-pay | Admitting: Family Medicine

## 2021-04-10 VITALS — BP 126/68 | HR 74 | Temp 96.0°F | Ht 74.0 in | Wt 187.0 lb

## 2021-04-10 DIAGNOSIS — I700 Atherosclerosis of aorta: Secondary | ICD-10-CM

## 2021-04-10 DIAGNOSIS — M21942 Unspecified acquired deformity of hand, left hand: Secondary | ICD-10-CM

## 2021-04-10 DIAGNOSIS — M47816 Spondylosis without myelopathy or radiculopathy, lumbar region: Secondary | ICD-10-CM

## 2021-04-10 DIAGNOSIS — Z953 Presence of xenogenic heart valve: Secondary | ICD-10-CM

## 2021-04-10 DIAGNOSIS — E782 Mixed hyperlipidemia: Secondary | ICD-10-CM

## 2021-04-10 DIAGNOSIS — I110 Hypertensive heart disease with heart failure: Secondary | ICD-10-CM

## 2021-04-10 DIAGNOSIS — M4127 Other idiopathic scoliosis, lumbosacral region: Secondary | ICD-10-CM

## 2021-04-10 DIAGNOSIS — I5032 Chronic diastolic (congestive) heart failure: Secondary | ICD-10-CM

## 2021-04-10 DIAGNOSIS — E559 Vitamin D deficiency, unspecified: Secondary | ICD-10-CM

## 2021-04-10 DIAGNOSIS — E538 Deficiency of other specified B group vitamins: Secondary | ICD-10-CM | POA: Diagnosis not present

## 2021-04-10 DIAGNOSIS — M47812 Spondylosis without myelopathy or radiculopathy, cervical region: Secondary | ICD-10-CM | POA: Diagnosis not present

## 2021-04-10 DIAGNOSIS — Z8673 Personal history of transient ischemic attack (TIA), and cerebral infarction without residual deficits: Secondary | ICD-10-CM

## 2021-04-10 DIAGNOSIS — F3131 Bipolar disorder, current episode depressed, mild: Secondary | ICD-10-CM

## 2021-04-10 DIAGNOSIS — G20 Parkinson's disease: Secondary | ICD-10-CM

## 2021-04-10 LAB — CBC WITH DIFFERENTIAL/PLATELET
Basophils Absolute: 0 10*3/uL (ref 0.0–0.2)
Basos: 0 %
EOS (ABSOLUTE): 0.2 10*3/uL (ref 0.0–0.4)
Eos: 2 %
Hematocrit: 39.3 % (ref 37.5–51.0)
Hemoglobin: 13.8 g/dL (ref 13.0–17.7)
Immature Grans (Abs): 0 10*3/uL (ref 0.0–0.1)
Immature Granulocytes: 0 %
Lymphocytes Absolute: 1.6 10*3/uL (ref 0.7–3.1)
Lymphs: 23 %
MCH: 34.4 pg — ABNORMAL HIGH (ref 26.6–33.0)
MCHC: 35.1 g/dL (ref 31.5–35.7)
MCV: 98 fL — ABNORMAL HIGH (ref 79–97)
Monocytes Absolute: 0.6 10*3/uL (ref 0.1–0.9)
Monocytes: 9 %
Neutrophils Absolute: 4.7 10*3/uL (ref 1.4–7.0)
Neutrophils: 66 %
Platelets: 180 10*3/uL (ref 150–450)
RBC: 4.01 x10E6/uL — ABNORMAL LOW (ref 4.14–5.80)
RDW: 12.1 % (ref 11.6–15.4)
WBC: 7.1 10*3/uL (ref 3.4–10.8)

## 2021-04-10 LAB — BASIC METABOLIC PANEL
BUN/Creatinine Ratio: 23 (ref 10–24)
BUN: 15 mg/dL (ref 8–27)
CO2: 22 mmol/L (ref 20–29)
Calcium: 9.5 mg/dL (ref 8.6–10.2)
Chloride: 105 mmol/L (ref 96–106)
Creatinine, Ser: 0.65 mg/dL — ABNORMAL LOW (ref 0.76–1.27)
Glucose: 100 mg/dL — ABNORMAL HIGH (ref 65–99)
Potassium: 4.7 mmol/L (ref 3.5–5.2)
Sodium: 143 mmol/L (ref 134–144)
eGFR: 100 mL/min/{1.73_m2} (ref >59)

## 2021-04-10 MED ORDER — SPIRONOLACTONE 25 MG PO TABS: 12.5000 mg | ORAL_TABLET | Freq: Every day | ORAL | 0 refills | Status: DC

## 2021-04-10 MED ORDER — DULOXETINE HCL 60 MG PO CPEP: 60.0000 mg | ORAL_CAPSULE | Freq: Every day | ORAL | 1 refills | Status: DC

## 2021-04-10 NOTE — Progress Notes (Signed)
New Patient Office Visit  Subjective:  Patient ID: Travis Reese, male    DOB: May 29, 1946  Age: 75 y.o. MRN: [id number]  CC:  Chief Complaint  Patient presents with   Establish Care   Parkinson's    HPI Travis Reese presents to establish care. Pt is a 75 yo WM with history of Aortic valve stenosis and AV replacement, diastolic CHF, Hypertension, TIAs, tobacco abuse, and recent diagnosis of Parkinson's disease. Pt saw Dr.Krazowski yesterday who has ordered a holter monitor and repeat TEE. Patient is choosing to establish care with [place] to be more connected with [facility] system.   The patient was admitted as follows: [place]: 12/14/20-12/20/20.  Patient had leg weakness and progressive difficulty ambulating for 5 weeks prior to admission. Brain MRI with several small areas of diffusion hyperintensity in the left parietal white matter and cortex.  Significant for subacute CVA. Cervical spine MRI with worsening severe left C3-4 neural foraminal stenosis.  Unchanged severe left C4-5 and right C5-6 neural foraminal stenosis.  No spinal canal stenosis.   Thoracic and lumbar spine MRI with moderate left and mild right foraminal stenosis T9-T10 and T10-T11.  Mild foraminal stenosis T11-T12.  Mild canal stenosis T9-T10.  Small central disc protrusion at T4-T5 and T5-T6.  Severe foraminal stenosis on the right L2-L3 and L4-L5.  Multilevel moderate foraminal stenosis.  Moderate to severe canal stenosis and severe right subarticular recess stenosis L4-L5.  Additional multilevel right subarticular recess narrowing. 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  Also diagnosed with B12 deficiency, bipolar depression, copd, htn s/p AV replacement, and BPH.  [facility]-01/06/2021: rehab for deconditioning. Home [place] (readmitted) 01/19/2021-01/24/2021: UTI. History  of CVA: subacute CVA on MRI done 4/14, patient supposed to have 2D echo and carotid Doppler as an outpatient, work-up has been pursued as an inpatient as discussed with neurology by phone, repeat MRI brain/MRA head and neck, there is no large vessel occlusion, no acute CVA,  2D echo cannot rule PFO, I have obtained venous Dopplers which were negative for DVT, he was monitored on telemetry, no evidence of A. fib during hospital stay, I have arranged for 30 days event monitor as an outpatient. B12 deficiency. [facility]: 01/24/2021-02/2012: rehab [facility]: 02/12/2021-03/13/2021: transitioned because they would no longer pay for  Home. Past Medical History:  Diagnosis Date   Allergic rhinosinusitis    Anxiety    Aortic valve sclerosis    Arthritis    knees, neck, shoulder   CHF (congestive heart failure) (HCC)    Cubital tunnel syndrome    DDD (degenerative disc disease), cervical    Depression    Elevated PSA    denies per patient   HTN (hypertension)    Hyperlipidemia    Hyperplasia of prostate with lower urinary tract symptoms (LUTS)    Hypertension    Mild dilation of ascending aorta (HCC)    Parkinson disease (HCC)    Prolapsed internal hemorrhoids, grade 3 04/19/2015   Rectal prolapse    Scoliosis of lumbosacral spine    Stroke ([place])    x2   Tremor    Vitamin D deficiency    Wears hearing aid    bilateral    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   BENTALL PROCEDURE N/A 05/01/2018   Procedure: BIOLOGICAL BENTALL PROCEDURE AORTIC ROOT REPLACEMENT WITH  30MM VALSALVA GRAFT & 27MM INSPIRIS VALVE.;  Surgeon: Rexene Alberts, MD;  Location: [place];  Service: Open Heart Surgery;  Laterality: N/A;   CATARACT EXTRACTION Bilateral 04/2018   COLONOSCOPY  2007   NASAL SINUS SURGERY  2006   RIGHT/LEFT HEART CATH AND CORONARY ANGIOGRAPHY N/A 04/27/2018   Procedure: RIGHT/LEFT HEART CATH AND CORONARY ANGIOGRAPHY;  Surgeon: Jolaine Artist, MD;  Location: [place] [facility];  Service: Cardiovascular;  Laterality: N/A;   TEE WITHOUT CARDIOVERSION N/A 04/27/2018   Procedure: TRANSESOPHAGEAL ECHOCARDIOGRAM (TEE);  Surgeon: Jolaine Artist, MD;  Location: [facility] ENDOSCOPY;  Service: Cardiovascular;  Laterality: N/A;   TEE WITHOUT CARDIOVERSION N/A 05/01/2018   Procedure: TRANSESOPHAGEAL ECHOCARDIOGRAM (TEE);  Surgeon: Rexene Alberts, MD;  Location: [place];  Service: Open Heart Surgery;  Laterality: N/A;   TRANSANAL HEMORRHOIDAL DEARTERIALIZATION N/A 11/15/2015   Procedure: TRANSANAL HEMORRHOIDAL DEARTERIALIZATION;  Surgeon: Leighton Ruff, MD;  Location: [place];  Service: General;  Laterality: N/A;    Family History  Problem Relation Age of Onset   COPD Mother    Heart attack Mother    Colon polyps Father    Prostate cancer Father    Parkinson's disease Father    Hyperlipidemia Father    Hypertension Sister    Hypertension Brother    Breast cancer Sister    Prostate cancer Paternal Uncle     Social History   Socioeconomic History   Marital status: Single    Spouse name: Not on file   Number of children: 2   Years of education: Not on file   Highest education level: Not on file  Occupational History   Occupation: retired    Comment: security guard  Tobacco Use   Smoking status: Some Days    Packs/day: 0.50    Years: 50.00    Pack years: 25.00    Types: Cigarettes    Last attempt to quit: 05/2018    Years since quitting: 2.9   Smokeless tobacco: Never  Vaping Use   Vaping Use: Former  Substance and Sexual Activity   Alcohol use: Not Currently    Comment: Occasional   Drug use: No   Sexual activity: Not Currently  Other Topics Concern   Not on file  Social History Narrative   Divorced lives with father after incarceration x 5 yrs   1 son, 1 daughter   Volunteer work   3 caffeine/day   04/19/2015   Right handed   Social Determinants of Health   Financial Resource Strain:  Not on file  Food Insecurity: Not on file  Transportation Needs: Not on file  Physical Activity: Not on file  Stress: Not on file  Social Connections: Not on file  Intimate Partner Violence: Not on file    ROS Review of Systems  Constitutional:  Negative for chills, diaphoresis, fatigue and fever.  HENT:  Positive for congestion. Negative for ear pain and sore throat.   Respiratory:  Negative for cough and shortness of breath.   Cardiovascular:  Negative for chest pain and leg swelling.  Gastrointestinal:  Negative for abdominal pain, constipation, diarrhea, nausea and vomiting.  Genitourinary:  Negative for dysuria and urgency.  Musculoskeletal:  Positive for back pain (scoliosis). Negative for arthralgias and myalgias.  Neurological:  Negative for dizziness and headaches.  Psychiatric/Behavioral:  Negative for dysphoric mood.    Objective:   Today's Vitals: BP 126/68   Pulse 74   Temp (!) 96 F (35.6 C)  Ht '6\' 2"'$  (1.88 m)   Wt 187 lb (84.8 kg)   SpO2 98%   BMI 24.01 kg/m   Physical Exam Vitals reviewed.  Constitutional:      Appearance: Normal appearance.  HENT:     Right Ear: Tympanic membrane, ear canal and external ear normal.     Left Ear: Tympanic membrane, ear canal and external ear normal.     Nose: Nose normal. No congestion or rhinorrhea.     Mouth/Throat:     Mouth: Mucous membranes are moist.     Pharynx: No oropharyngeal exudate or posterior oropharyngeal erythema.  Neck:     Vascular: No carotid bruit.  Cardiovascular:     Rate and Rhythm: Normal rate and regular rhythm.     Pulses: Normal pulses.     Heart sounds: Normal heart sounds.  Pulmonary:     Effort: Pulmonary effort is normal. No respiratory distress.     Breath sounds: Normal breath sounds. No wheezing, rhonchi or rales.  Abdominal:     General: Bowel sounds are normal.     Palpations: Abdomen is soft.     Tenderness: There is no abdominal tenderness.  Musculoskeletal:         General: Deformity (left hand - 4th and 5th digits curved. unable to straighten. no contractures.) present.  Lymphadenopathy:     Cervical: No cervical adenopathy.  Neurological:     Mental Status: He is alert.     Sensory: Sensory deficit (numbness tingling of left 4th and 5th digits.) present.     Coordination: Coordination abnormal (cogwheeling.).     Gait: Gait abnormal (stooped, shuffling.).  Psychiatric:        Mood and Affect: Mood normal.        Behavior: Behavior normal.    Assessment & Plan:  1. Parkinson's disease ([place]) The current medical regimen is effective;  continue present plan and medications. Management per specialist. Dr Tat.  2. Hypertensive heart disease with chronic diastolic congestive heart failure ([place]) Well controlled.  No changes to medicines.  Continue to work on eating a healthy diet and exercise.  Labs drawn today.  - CBC with Differential/Platelet; Future - Comprehensive metabolic panel; Future - TSH; Future  3. Mixed hyperlipidemia Well controlled.  No changes to medicines.  Continue to work on eating a healthy diet and exercise.  Labs drawn today.  - Lipid panel; Future  4. B12 deficiency B12 was very low in hospital in May. - B12 and Folate Panel; Future - Methylmalonic acid, serum; Future  5. S/P aortic valve replacement with bioprosthetic valve Followed by cardiovascular.   6. Bipolar affective disorder, depressed, mild degree (HCC) Increase duloxetine 60 mg once daily.   7. Other idiopathic scoliosis, lumbosacral region Mgmt by Dr. Patrice Paradise.   8. Osteoarthritis of cervical and lumbar spine Mgmt by Dr. Patrice Paradise.   9. History of cardioembolic cerebrovascular accident (CVA) Cardiology has ordered a holtor monitor and a repeat TEE. The first TEE was inconclusive.  10. Vitamin D deficiency - VITAMIN D 25 Hydroxy (Vit-D Deficiency, Fractures); Future  11. Atherosclerosis of aorta (HCC) Not currently on a statin. Discuss after  labwork.   12.  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 Pt to consider calling orthopedics as this is worsening. Continue stretching.     Outpatient Encounter Medications as of 04/10/2021  Medication Sig   DULoxetine (CYMBALTA) 60 MG capsule Take 1 capsule (60 mg total) by mouth daily.   tadalafil (CIALIS) 5 MG tablet Take  5 mg by mouth daily.   acetaminophen (TYLENOL) 325 MG tablet Take 2 tablets (650 mg total) by mouth every 6 (six) hours as needed for mild pain (or Fever >/= 101).   albuterol (VENTOLIN HFA) 108 (90 Base) MCG/ACT inhaler Inhale 2 puffs into the lungs every 6 (six) hours as needed for wheezing or shortness of breath.   aspirin EC 325 MG EC tablet Take 1 tablet (325 mg total) by mouth daily.   carbidopa-levodopa (SINEMET IR) 25-100 MG tablet Take 1/2 tablet at 7am/11am/4pm x 1 week, Then take 1/2 tablet at 7am,  1/2 tablet at 11am, 1 tablet at 4pm, at least 30 minutes before meals x 1 week, then take 1/2 tablet at 7am, 1 tablet at 11am, 1 at 4pm, at least 30 minutes before meals, x 1 week, then 1 po at 7am/11am/4pm (Patient taking differently: Take 1 tablet by mouth 3 (three) times daily. 1 po at 7am/11am/4pm)   carvedilol (COREG) 25 MG tablet Take 1 tablet (25 mg total) by mouth 2 (two) times daily with a meal. Must keep scheduled appointment for further refills   celecoxib (CELEBREX) 100 MG capsule Take 100 mg by mouth every morning.   clonazePAM (KLONOPIN) 1 MG tablet Take 1 tablet (1 mg total) by mouth at bedtime.   feeding supplement (ENSURE ENLIVE / ENSURE PLUS) LIQD Take 237 mLs by mouth 3 (three) times daily between meals. (Patient not taking: No sig reported)   fluticasone (FLONASE) 50 MCG/ACT nasal spray Place 1 spray into both nostrils at bedtime.   Krill Oil 1000 MG CAPS Take 1,000 mg by mouth at bedtime.   Melatonin 10 MG CAPS Take 10 mg by mouth at bedtime.   montelukast (SINGULAIR) 10 MG tablet Take 10 mg by mouth at bedtime.   primidone (MYSOLINE) 50  MG tablet Take 1 tablet (50 mg total) by mouth at bedtime.   spironolactone (ALDACTONE) 25 MG tablet Take 0.5 tablets (12.5 mg total) by mouth daily.   tamsulosin (FLOMAX) 0.4 MG CAPS capsule Take 0.4 mg by mouth 2 (two) times daily after a meal.   Vitamin D, Ergocalciferol, (DRISDOL) 1.25 MG (50000 UT) CAPS capsule Take 50,000 Units by mouth every Monday.   Zinc 50 MG TABS Take 50 mg by mouth at bedtime.   [DISCONTINUED] cyanocobalamin (,VITAMIN B-12,) 1000 MCG/ML injection Inject 1 mL (1,000 mcg total) into the muscle every 30 (thirty) days for 10 doses. (Patient not taking: Reported on 04/09/2021)   [DISCONTINUED] DULoxetine (CYMBALTA) 30 MG capsule Take 30 mg by mouth daily.   [DISCONTINUED] spironolactone (ALDACTONE) 25 MG tablet Take 0.5 tablets (12.5 mg total) by mouth daily. NEEDS AN APPOINTMENT FOR FURTHER REFILLS (Patient taking differently: Take 12.5 mg by mouth daily.)   No facility-administered encounter medications on file as of 04/10/2021.   I spent 60 minutes reviewing records and seeing patient face to face.   Follow-up: Return in about 2 weeks (around 04/24/2021).   Rochel Brome, MD

## 2021-04-11 ENCOUNTER — Telehealth: Payer: Self-pay | Admitting: Family Medicine

## 2021-04-11 NOTE — Chronic Care Management (AMB) (Signed)
  Chronic Care Management   Note  04/11/2021 Name: Travis Reese MRN: [id number] DOB: 11/21/1945  Travis Reese is a 75 y.o. year old male who is a primary care patient of Cox, Kirsten, MD. I reached out to [employer] by phone today in response to a referral sent by Travis Reese's PCP, Cox, Kirsten, MD.   Travis Reese was given information about Chronic Care Management services today including:  CCM service includes personalized support from designated clinical staff supervised by his physician, including individualized plan of care and coordination with other care providers 24/7 contact phone numbers for assistance for urgent and routine care needs. Service will only be billed when office clinical staff spend 20 minutes or more in a month to coordinate care. Only one practitioner may furnish and bill the service in a calendar month. The patient may stop CCM services at any time (effective at the end of the month) by phone call to the office staff.   Patient agreed to services and verbal consent obtained.   Follow up plan:   Tatjana Secretary/administrator

## 2021-04-13 ENCOUNTER — Encounter (HOSPITAL_COMMUNITY): Payer: Self-pay | Admitting: Cardiology

## 2021-04-13 ENCOUNTER — Ambulatory Visit (HOSPITAL_COMMUNITY): Payer: Medicare Other | Admitting: Anesthesiology

## 2021-04-13 ENCOUNTER — Ambulatory Visit (HOSPITAL_BASED_OUTPATIENT_CLINIC_OR_DEPARTMENT_OTHER)
Admission: RE | Admit: 2021-04-13 | Discharge: 2021-04-13 | Disposition: A | Discharge status: Home / Self Care | Payer: Medicare Other | Source: Home / Self Care | Attending: Cardiology | Admitting: Cardiology

## 2021-04-13 ENCOUNTER — Encounter (HOSPITAL_COMMUNITY)
Admission: RE | Disposition: A | Discharge status: Home / Self Care | Payer: Self-pay | Source: Home / Self Care | Attending: Cardiology

## 2021-04-13 ENCOUNTER — Other Ambulatory Visit: Payer: Self-pay

## 2021-04-13 ENCOUNTER — Ambulatory Visit (HOSPITAL_COMMUNITY)
Admission: RE | Admit: 2021-04-13 | Discharge: 2021-04-13 | Disposition: A | Discharge status: Home / Self Care | Payer: Medicare Other | Attending: Cardiology | Admitting: Cardiology

## 2021-04-13 DIAGNOSIS — Z7982 Long term (current) use of aspirin: Secondary | ICD-10-CM | POA: Insufficient documentation

## 2021-04-13 DIAGNOSIS — Z8673 Personal history of transient ischemic attack (TIA), and cerebral infarction without residual deficits: Secondary | ICD-10-CM | POA: Diagnosis not present

## 2021-04-13 DIAGNOSIS — I5032 Chronic diastolic (congestive) heart failure: Secondary | ICD-10-CM | POA: Insufficient documentation

## 2021-04-13 DIAGNOSIS — G20 Parkinson's disease: Secondary | ICD-10-CM | POA: Insufficient documentation

## 2021-04-13 DIAGNOSIS — Q211 Atrial septal defect: Secondary | ICD-10-CM | POA: Diagnosis not present

## 2021-04-13 DIAGNOSIS — E785 Hyperlipidemia, unspecified: Secondary | ICD-10-CM | POA: Insufficient documentation

## 2021-04-13 DIAGNOSIS — I110 Hypertensive heart disease with heart failure: Secondary | ICD-10-CM | POA: Insufficient documentation

## 2021-04-13 DIAGNOSIS — I083 Combined rheumatic disorders of mitral, aortic and tricuspid valves: Secondary | ICD-10-CM | POA: Diagnosis not present

## 2021-04-13 DIAGNOSIS — Z87891 Personal history of nicotine dependence: Secondary | ICD-10-CM | POA: Diagnosis not present

## 2021-04-13 DIAGNOSIS — Z952 Presence of prosthetic heart valve: Secondary | ICD-10-CM | POA: Diagnosis not present

## 2021-04-13 DIAGNOSIS — Z79899 Other long term (current) drug therapy: Secondary | ICD-10-CM | POA: Insufficient documentation

## 2021-04-13 DIAGNOSIS — E782 Mixed hyperlipidemia: Secondary | ICD-10-CM | POA: Diagnosis not present

## 2021-04-13 DIAGNOSIS — Q2112 Patent foramen ovale: Secondary | ICD-10-CM

## 2021-04-13 HISTORY — PX: TEE WITHOUT CARDIOVERSION: SHX5443

## 2021-04-13 HISTORY — PX: BUBBLE STUDY: SHX6837

## 2021-04-13 LAB — ECHO TEE
AV Mean grad: 7 mmHg
AV Peak grad: 14.1 mmHg
Ao pk vel: 1.88 m/s

## 2021-04-13 SURGERY — ECHOCARDIOGRAM, TRANSESOPHAGEAL
Anesthesia: Monitor Anesthesia Care

## 2021-04-13 MED ORDER — SODIUM CHLORIDE 0.9 % IV SOLN
INTRAVENOUS | Status: AC | PRN
  Administered 2021-04-13: 500 mL via INTRAMUSCULAR

## 2021-04-13 MED ORDER — SODIUM CHLORIDE 0.9 % IV SOLN: INTRAVENOUS | Status: DC

## 2021-04-13 MED ORDER — PROPOFOL 500 MG/50ML IV EMUL
INTRAVENOUS | Status: DC | PRN
  Administered 2021-04-13: 125 ug/kg/min via INTRAVENOUS

## 2021-04-13 MED ORDER — PROPOFOL 10 MG/ML IV BOLUS
INTRAVENOUS | Status: DC | PRN
  Administered 2021-04-13: 30 mg via INTRAVENOUS

## 2021-04-13 MED ORDER — SODIUM CHLORIDE 0.9 % IV SOLN: INTRAVENOUS | Status: DC | PRN

## 2021-04-13 NOTE — Anesthesia Postprocedure Evaluation (Signed)
Anesthesia Post Note  Patient: Travis Reese  Procedure(s) Performed: TRANSESOPHAGEAL ECHOCARDIOGRAM (TEE) BUBBLE STUDY     Patient location during evaluation: Endoscopy Anesthesia Type: MAC Level of consciousness: awake and alert Pain management: pain level controlled Vital Signs Assessment: post-procedure vital signs reviewed and stable Respiratory status: spontaneous breathing, nonlabored ventilation and respiratory function stable Cardiovascular status: stable and blood pressure returned to baseline Postop Assessment: no apparent nausea or vomiting Anesthetic complications: no   No notable events documented.  Last Vitals:  Vitals:   04/13/21 1050 04/13/21 1055  BP: (!) 150/84 (!) 148/71  Pulse: (!) 57 (!) 56  Resp: 18 15  Temp:    SpO2: 100% 98%    Last Pain:  Vitals:   04/13/21 1055  TempSrc:   PainSc: 0-No pain                 Sherrod Toothman,W. EDMOND

## 2021-04-13 NOTE — Interval H&P Note (Signed)
History and Physical Interval Note:  04/13/2021 9:33 AM  Travis Reese  has presented today for surgery, with the diagnosis of RULE OUT A PFO.  The various methods of treatment have been discussed with the patient and family. After consideration of risks, benefits and other options for treatment, the patient has consented to  Procedure(s): TRANSESOPHAGEAL ECHOCARDIOGRAM (TEE) (N/A) as a surgical intervention.  The patient's history has been reviewed, patient examined, no change in status, stable for surgery.  I have reviewed the patient's chart and labs.  Questions were answered to the patient's satisfaction.     Yordy Matton Harrell Gave

## 2021-04-13 NOTE — Transfer of Care (Signed)
Immediate Anesthesia Transfer of Care Note  Patient: Travis Reese  Procedure(s) Performed: TRANSESOPHAGEAL ECHOCARDIOGRAM (TEE) BUBBLE STUDY  Patient Location: Endoscopy Unit  Anesthesia Type:MAC  Level of Consciousness: drowsy and patient cooperative  Airway & Oxygen Therapy: Patient Spontanous Breathing and Patient connected to nasal cannula oxygen  Post-op Assessment: Report given to RN and Post -op Vital signs reviewed and stable  Post vital signs: Reviewed and stable  Last Vitals:  Vitals Value Taken Time  BP 126/73   Temp    Pulse 57 04/13/21 1040  Resp 12 04/13/21 1040  SpO2 100 % 04/13/21 1040  Vitals shown include unvalidated device data.  Last Pain:  Vitals:   04/13/21 0924  TempSrc: Axillary  PainSc: 0-No pain         Complications: No notable events documented.

## 2021-04-13 NOTE — Anesthesia Procedure Notes (Signed)
Procedure Name: MAC Date/Time: 04/13/2021 10:07 AM Performed by: Kathryne Hitch, CRNA Pre-anesthesia Checklist: Patient identified, Emergency Drugs available, Suction available and Patient being monitored Patient Re-evaluated:Patient Re-evaluated prior to induction Oxygen Delivery Method: Nasal cannula Preoxygenation: Pre-oxygenation with 100% oxygen Induction Type: IV induction Dental Injury: Teeth and Oropharynx as per pre-operative assessment

## 2021-04-13 NOTE — Anesthesia Preprocedure Evaluation (Addendum)
Anesthesia Evaluation  Patient identified by MRN, date of birth, ID band Patient awake    Reviewed: Allergy & Precautions, H&P , NPO status , Patient's Chart, lab work & pertinent test results, reviewed documented beta blocker date and time   Airway Mallampati: III  TM Distance: >3 FB Neck ROM: Full    Dental no notable dental hx. (+) Teeth Intact, Dental Advisory Given   Pulmonary Current Smoker and Patient abstained from smoking.,    Pulmonary exam normal breath sounds clear to auscultation       Cardiovascular hypertension, Pt. on medications and Pt. on home beta blockers +CHF   Rhythm:Regular Rate:Normal     Neuro/Psych Anxiety Depression  Neuromuscular disease CVA    GI/Hepatic negative GI ROS, Neg liver ROS,   Endo/Other  negative endocrine ROS  Renal/GU negative Renal ROS  negative genitourinary   Musculoskeletal  (+) Arthritis , Osteoarthritis,    Abdominal   Peds  Hematology negative hematology ROS (+)   Anesthesia Other Findings   Reproductive/Obstetrics negative OB ROS                            Anesthesia Physical Anesthesia Plan  ASA: 3  Anesthesia Plan: MAC   Post-op Pain Management:    Induction: Intravenous  PONV Risk Score and Plan: 0 and Propofol infusion  Airway Management Planned: Nasal Cannula  Additional Equipment:   Intra-op Plan:   Post-operative Plan:   Informed Consent: I have reviewed the patients History and Physical, chart, labs and discussed the procedure including the risks, benefits and alternatives for the proposed anesthesia with the patient or authorized representative who has indicated his/her understanding and acceptance.     Dental advisory given  Plan Discussed with: CRNA  Anesthesia Plan Comments:         Anesthesia Quick Evaluation

## 2021-04-13 NOTE — CV Procedure (Signed)
    TRANSESOPHAGEAL ECHOCARDIOGRAM   NAME:  Travis Reese   MRN: [id number] DOB:  [birth year]   ADMIT DATE: 04/13/2021  INDICATIONS: History of stroke, rule out patent foramen ovale  PROCEDURE:   Informed consent was obtained prior to the procedure. The risks, benefits and alternatives for the procedure were discussed and the patient comprehended these risks.  Risks include, but are not limited to, cough, sore throat, vomiting, nausea, somnolence, esophageal and stomach trauma or perforation, bleeding, low blood pressure, aspiration, pneumonia, infection, trauma to the teeth and death.    Procedural time out performed. Patient received monitored anesthesia care under the supervision of Dr. Ola Spurr. Patient received a total of 231 mg propofol during the procedure.  The transesophageal probe was inserted in the esophagus and stomach without difficulty and multiple views were obtained.    COMPLICATIONS:    There were no immediate complications.  FINDINGS:  LEFT VENTRICLE: EF = 55-60%. No regional wall motion abnormalities.  RIGHT VENTRICLE: Normal size and function.   LEFT ATRIUM: No thrombus/mass.   LEFT ATRIAL APPENDAGE: No thrombus/mass.   RIGHT ATRIUM: No thrombus/mass.  AORTIC VALVE:  S/P prior surgery. 05/01/2017 [place] aortic root replacement with 27 mm Edwards Inspiris stented bovine pericardial valve. Trivial central regurgitation, no significant perivalvular flow, appears well seated, cusps open normally. No vegetation.  MITRAL VALVE:    Normal structure. Trivial regurgitation. No vegetation.  TRICUSPID VALVE: Normal structure. Trivial regurgitation. No vegetation.  PULMONIC VALVE: Grossly normal structure. No significant regurgitation. No apparent vegetation.  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 Appears consistent with patent foramen ovale. Rapidly positive bubble  study with right to left shunting  PERICARDIUM: No effusion noted.  DESCENDING AORTA: Moderate diffuse plaque seen   CONCLUSION: Study positive for patient foramen ovale, with right to left shunting by color doppler and agitated saline.   Buford Dresser, MD, PhD [facility]  [address], [place], [place] [address] [phone]   10:34 AM

## 2021-04-13 NOTE — Discharge Instructions (Signed)

## 2021-04-13 NOTE — Progress Notes (Signed)
  Echocardiogram Echocardiogram Transesophageal has been performed.  Travis Reese 04/13/2021, 10:53 AM

## 2021-04-17 ENCOUNTER — Encounter (HOSPITAL_COMMUNITY): Payer: Self-pay | Admitting: Cardiology

## 2021-04-17 ENCOUNTER — Telehealth: Payer: Self-pay

## 2021-04-17 NOTE — Chronic Care Management (AMB) (Signed)
Chronic Care Management Pharmacy Assistant   Name: Travis Reese  MRN: [id number] DOB: February 17, 1946  Travis Reese is an 75 y.o. year old male who presents for his initial CCM visit with the clinical pharmacist.  Reason for Encounter: Chart Prep/IQ   Recent office visits:  04/10/21- Rochel Brome, MD- seen to establish care/ parkinson's increased duloxetine from 30 mg daily to 60 mg daily, labs ordered, follow up 2 weeks  Recent consult visits:  04/09/21- Cyndy Freeze, MD (Cardiology)-seen for follow-up status post  aortic valve replacement bioprosthetic aortic valve , labs ordered, ZIO patch ordered, no medication changes, follow up 3 months 03/21/21- Landis Martins, DPM (Podiatry)- seen for pain due to onychomycosis of toenails of both feet, debridement performed, no medication changes, follow up 3 months 02/23/21- Wells Guiles Tat, DO (Neurology Video Visit)- seen for parkinson's with tremor, started levodopa 25/100 with titration schedule, referral to cardiology, follow up as scheduled 01/23/21- Orders Only (Cardiology)- 30 Day Cardiac Event Monitor to evaluate CVA 01/18/21- Wells Guiles Tat, DO (Neurology)- seen for parkinson's with tremor, echocardiogram ordered, [place] ordered, no medication changes, no follow up documented  Hospital visits:  Medication Reconciliation was completed by comparing discharge summary, patient's EMR and Pharmacy list, and upon discussion with patient.  Admitted to the hospital on 01/19/21 due to UTI. Discharge date was 01/24/21. Discharged from [place]?Medications Started at [facility] Discharge:?? cephALEXin 500 mg x 4 days cyanocobalamin 1000 mcg/ml every 30 days feeding supplement 237 ml tid between meals  Medications Discontinued at Hospital Discharge: tadalafil 5 MG   All other medications  remain the same after Hospital Discharge:??    2. 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 Discharge  date was 12/20/20.Marland Kitchen Discharged from [place]?Medications Started at [facility] Discharge:?? Cyanocobalamin 1000 mcg/ ml daily x 15 days folic acid 1 mg daily  Medications Discontinued at Hospital Discharge: ARIPiprazole 10 MG due to induced parkinson's  All other medications remain the same after Hospital Discharge:??   Medications: Outpatient Encounter Medications as of 04/17/2021  Medication Sig   acetaminophen (TYLENOL) 325 MG tablet Take 2 tablets (650 mg total) by mouth every 6 (six) hours as needed for mild pain (or Fever >/= 101).   albuterol (VENTOLIN HFA) 108 (90 Base) MCG/ACT inhaler Inhale 2 puffs into the lungs every 6 (six) hours as needed for wheezing or shortness of breath.   aspirin EC 325 MG EC tablet Take 1 tablet (325 mg total) by mouth daily.   carbidopa-levodopa (SINEMET IR) 25-100 MG tablet Take 1/2 tablet at 7am/11am/4pm x 1 week, Then take 1/2 tablet at 7am,  1/2 tablet at 11am, 1 tablet at 4pm, at least 30 minutes before meals x 1 week, then take 1/2 tablet at 7am, 1 tablet at 11am, 1 at 4pm, at least 30 minutes before meals, x 1 week, then 1 po at 7am/11am/4pm (Patient taking differently: Take 1 tablet by mouth 3 (three) times daily. 1 po at 7am/11am/4pm)   carvedilol (COREG) 25 MG tablet Take 1 tablet (25 mg total) by mouth 2 (two) times daily with a meal. Must keep scheduled appointment for further refills   celecoxib (CELEBREX) 100 MG capsule Take 100 mg by mouth every morning.   clonazePAM (KLONOPIN) 1 MG tablet Take 1 tablet (1 mg total) by mouth at bedtime.   DULoxetine (CYMBALTA) 60 MG capsule Take 1 capsule (60 mg total) by mouth daily.  feeding supplement (ENSURE ENLIVE / ENSURE PLUS) LIQD Take 237 mLs by mouth 3 (three) times daily between meals. (Patient not taking: No sig reported)   fluticasone (FLONASE) 50 MCG/ACT nasal spray Place 1 spray into both nostrils at bedtime.   Krill Oil 1000 MG CAPS Take 1,000 mg by mouth at  bedtime.   Melatonin 10 MG CAPS Take 10 mg by mouth at bedtime.   montelukast (SINGULAIR) 10 MG tablet Take 10 mg by mouth at bedtime.   primidone (MYSOLINE) 50 MG tablet Take 1 tablet (50 mg total) by mouth at bedtime.   spironolactone (ALDACTONE) 25 MG tablet Take 0.5 tablets (12.5 mg total) by mouth daily.   tadalafil (CIALIS) 5 MG tablet Take 5 mg by mouth daily.   tamsulosin (FLOMAX) 0.4 MG CAPS capsule Take 0.4 mg by mouth 2 (two) times daily after a meal.   Vitamin D, Ergocalciferol, (DRISDOL) 1.25 MG (50000 UT) CAPS capsule Take 50,000 Units by mouth every Monday.   Zinc 50 MG TABS Take 50 mg by mouth at bedtime.   No facility-administered encounter medications on file as of 04/17/2021.    No results found for: HGBA1C, MICROALBUR   BP Readings from Last 3 Encounters:  04/13/21 (!) 148/71  04/10/21 126/68  04/09/21 (!) 102/58    Current Documented Medications albuterol 108  MCG/ACT inhaler- 25 DS last filled 01/24/21 carvedilol  25 MG- 30 DS last filled 01/24/21 celecoxib 100 MG - 30 DS last filled 01/24/21 clonazePAM  1 MG- 14 DS last filled 02/02/21 DULoxetine  60 MG - 30 DS last filled 01/24/21 fluticasone 50 MCG/ACT nasal spray- 60 DS last filled 01/24/21 Melatonin 10 MG - 30 DS last filled 01/24/21 montelukast  10 MG- 30 DS last filled 01/24/21 primidone 50 MG - 30 DS last filled 01/24/21 spironolactone  25 MG - 30 DS last filled 01/24/21 tadalafil  5 MG- 30 DS last filled 12/21/20 tamsulosin 0.4 MG- 30 DS last filled 02/17/21  Ergocalciferol 50000 UT- 28 DS last filled 01/24/21  No Fill Hx acetaminophen  325 MG tablet aspirin EC 325 MG EC tablet carbidopa-levodopa  25-100 MG tablet feeding supplement  LIQD Krill Oil 1000 MG CAPS Zinc 50 MG TABS  Star Rating Drugs: No star rating drugs  Have you seen any other providers since your last visit with PCP?  Patient stated he only had a TEE procedure 08/12 and is still waiting for the results to be read to  him  Any changes in your medications or health?  04/10/21- Rochel Brome, MD- increased duloxetine from 30 mg daily to 60 mg daily  Any side effects from any medications?  Patient stated he does not have any side effects from medications  Do you have an symptoms or problems not managed by your medications?  Patient stated he has no unmanaged problems   Any concerns about your health right now?  Patient stated he has no concerns for his health   Has your provider asked that you check blood pressure, blood sugar, or follow special diet at home?  Patient stated he was not asked to track his BP but he does have an arm monitor which he uses. Patient stated that he doesn't have a special diet and eats 3 meals a day. He has not started incorporating liquid supplements as yet.  Do you get any type of exercise on a regular basis?  Patient stated he walks about 1/2 mile daily and is able to walk at least 1/8 mile at  a time  Can you think of a goal you would like to reach for your health? Patient stated he currently has no goals  Do you have any problems getting your medications?  Patient stated he is not having problems with [facility]. He only has to pay $30 for celebrex which is his most unmanageable co-pays  Is there anything that you would like to discuss during the appointment?  Patient stated he had nothing to discuss  Travis Reese was reminded to have all medications, supplements and any blood glucose and blood pressure readings available for review with Clarise Cruz B. Joline Salt. D, at his office visit on 08/23 at 9 am .   Care Gaps: Last annual wellness visit? Upcoming 0000000 If applicable: N/A Last eye exam / retinopathy screening? Last diabetic foot exam?  [facility] CPA,CMA

## 2021-04-19 ENCOUNTER — Other Ambulatory Visit: Payer: Medicare Other

## 2021-04-19 ENCOUNTER — Other Ambulatory Visit: Payer: Self-pay

## 2021-04-19 DIAGNOSIS — I110 Hypertensive heart disease with heart failure: Secondary | ICD-10-CM | POA: Diagnosis not present

## 2021-04-19 DIAGNOSIS — E782 Mixed hyperlipidemia: Secondary | ICD-10-CM | POA: Diagnosis not present

## 2021-04-19 DIAGNOSIS — I5032 Chronic diastolic (congestive) heart failure: Secondary | ICD-10-CM | POA: Diagnosis not present

## 2021-04-19 DIAGNOSIS — E538 Deficiency of other specified B group vitamins: Secondary | ICD-10-CM | POA: Diagnosis not present

## 2021-04-19 DIAGNOSIS — E559 Vitamin D deficiency, unspecified: Secondary | ICD-10-CM | POA: Diagnosis not present

## 2021-04-21 LAB — VITAMIN D 25 HYDROXY (VIT D DEFICIENCY, FRACTURES): Vit D, 25-Hydroxy: 53.9 ng/mL (ref 30.0–100.0)

## 2021-04-21 LAB — COMPREHENSIVE METABOLIC PANEL
ALT: 3 IU/L (ref 0–44)
AST: 11 IU/L (ref 0–40)
Albumin/Globulin Ratio: 1.8 (ref 1.2–2.2)
Albumin: 4.3 g/dL (ref 3.7–4.7)
Alkaline Phosphatase: 77 IU/L (ref 44–121)
BUN/Creatinine Ratio: 19 (ref 10–24)
BUN: 14 mg/dL (ref 8–27)
Bilirubin Total: 0.6 mg/dL (ref 0.0–1.2)
CO2: 21 mmol/L (ref 20–29)
Calcium: 9.8 mg/dL (ref 8.6–10.2)
Chloride: 102 mmol/L (ref 96–106)
Creatinine, Ser: 0.75 mg/dL — ABNORMAL LOW (ref 0.76–1.27)
Globulin, Total: 2.4 g/dL (ref 1.5–4.5)
Glucose: 98 mg/dL (ref 65–99)
Potassium: 4.3 mmol/L (ref 3.5–5.2)
Sodium: 139 mmol/L (ref 134–144)
Total Protein: 6.7 g/dL (ref 6.0–8.5)
eGFR: 96 mL/min/{1.73_m2} (ref >59)

## 2021-04-21 LAB — LIPID PANEL
Chol/HDL Ratio: 2.9 ratio (ref 0.0–5.0)
Cholesterol, Total: 149 mg/dL (ref 100–199)
HDL: 52 mg/dL (ref >39)
LDL Chol Calc (NIH): 84 mg/dL (ref 0–99)
Triglycerides: 66 mg/dL (ref 0–149)
VLDL Cholesterol Cal: 13 mg/dL (ref 5–40)

## 2021-04-21 LAB — CBC WITH DIFFERENTIAL/PLATELET
Basophils Absolute: 0 10*3/uL (ref 0.0–0.2)
Basos: 0 %
EOS (ABSOLUTE): 0.2 10*3/uL (ref 0.0–0.4)
Eos: 3 %
Hematocrit: 40.1 % (ref 37.5–51.0)
Hemoglobin: 13.7 g/dL (ref 13.0–17.7)
Immature Grans (Abs): 0 10*3/uL (ref 0.0–0.1)
Immature Granulocytes: 0 %
Lymphocytes Absolute: 1.6 10*3/uL (ref 0.7–3.1)
Lymphs: 26 %
MCH: 33.6 pg — ABNORMAL HIGH (ref 26.6–33.0)
MCHC: 34.2 g/dL (ref 31.5–35.7)
MCV: 98 fL — ABNORMAL HIGH (ref 79–97)
Monocytes Absolute: 0.5 10*3/uL (ref 0.1–0.9)
Monocytes: 9 %
Neutrophils Absolute: 3.8 10*3/uL (ref 1.4–7.0)
Neutrophils: 62 %
Platelets: 193 10*3/uL (ref 150–450)
RBC: 4.08 x10E6/uL — ABNORMAL LOW (ref 4.14–5.80)
RDW: 11.9 % (ref 11.6–15.4)
WBC: 6.1 10*3/uL (ref 3.4–10.8)

## 2021-04-21 LAB — CARDIOVASCULAR RISK ASSESSMENT

## 2021-04-21 LAB — METHYLMALONIC ACID, SERUM: Methylmalonic Acid: 223 nmol/L (ref 0–378)

## 2021-04-21 LAB — TSH: TSH: 1.24 u[IU]/mL (ref 0.450–4.500)

## 2021-04-21 LAB — B12 AND FOLATE PANEL
Folate: 6.1 ng/mL (ref >3.0)
Vitamin B-12: 506 pg/mL (ref 232–1245)

## 2021-04-23 ENCOUNTER — Ambulatory Visit (INDEPENDENT_AMBULATORY_CARE_PROVIDER_SITE_OTHER): Payer: Medicare Other | Admitting: Family Medicine

## 2021-04-23 ENCOUNTER — Other Ambulatory Visit: Payer: Self-pay

## 2021-04-23 ENCOUNTER — Encounter: Payer: Self-pay | Admitting: Family Medicine

## 2021-04-23 VITALS — BP 110/64 | HR 72 | Temp 97.5°F | Resp 16 | Ht 74.0 in | Wt 185.0 lb

## 2021-04-23 DIAGNOSIS — F330 Major depressive disorder, recurrent, mild: Secondary | ICD-10-CM

## 2021-04-23 DIAGNOSIS — Q2112 Patent foramen ovale: Secondary | ICD-10-CM

## 2021-04-23 DIAGNOSIS — Z953 Presence of xenogenic heart valve: Secondary | ICD-10-CM | POA: Diagnosis not present

## 2021-04-23 DIAGNOSIS — Q211 Atrial septal defect: Secondary | ICD-10-CM | POA: Diagnosis not present

## 2021-04-23 DIAGNOSIS — I499 Cardiac arrhythmia, unspecified: Secondary | ICD-10-CM

## 2021-04-23 DIAGNOSIS — Z8673 Personal history of transient ischemic attack (TIA), and cerebral infarction without residual deficits: Secondary | ICD-10-CM | POA: Diagnosis not present

## 2021-04-23 NOTE — Progress Notes (Signed)
Subjective:  Patient ID: Travis Reese, male    DOB: 31-May-1946  Age: 75 y.o. MRN: [id number]  Chief Complaint  Patient presents with   COPD   Tremors    HPI Pt has suffered from depression for all his life. He was tried on abilify. I increased cymbalta 60 mg once daily has helped with anxiety. It may be helping. Denies Suicidal ideation.   PFO: Moderate on recent TEE. He is currently wearing a holtor monitor.  Pt has not received a call from cardiology yet, but I discussed with Dr. Kirkland Hun. His office will reach out to the patient to schedule a follow up appt with him.  Current Outpatient Medications on File Prior to Visit  Medication Sig Dispense Refill   acetaminophen (TYLENOL) 325 MG tablet Take 2 tablets (650 mg total) by mouth every 6 (six) hours as needed for mild pain (or Fever >/= 101).     albuterol (VENTOLIN HFA) 108 (90 Base) MCG/ACT inhaler Inhale 2 puffs into the lungs every 6 (six) hours as needed for wheezing or shortness of breath.     aspirin EC 325 MG EC tablet Take 1 tablet (325 mg total) by mouth daily. 30 tablet 0   carbidopa-levodopa (SINEMET IR) 25-100 MG tablet Take 1/2 tablet at 7am/11am/4pm x 1 week, Then take 1/2 tablet at 7am,  1/2 tablet at 11am, 1 tablet at 4pm, at least 30 minutes before meals x 1 week, then take 1/2 tablet at 7am, 1 tablet at 11am, 1 at 4pm, at least 30 minutes before meals, x 1 week, then 1 po at 7am/11am/4pm (Patient taking differently: Take 1 tablet by mouth 3 (three) times daily. 1 po at 7am/11am/4pm) 270 tablet 0   carvedilol (COREG) 25 MG tablet Take 1 tablet (25 mg total) by mouth 2 (two) times daily with a meal. Must keep scheduled appointment for further refills 180 tablet 0   celecoxib (CELEBREX) 100 MG capsule Take 100 mg by mouth every morning.     clonazePAM (KLONOPIN) 1 MG tablet Take 1 tablet (1 mg total) by mouth at bedtime. 10 tablet 0   DULoxetine (CYMBALTA) 60 MG capsule Take 1 capsule (60 mg total) by mouth daily. 90  capsule 1   fluticasone (FLONASE) 50 MCG/ACT nasal spray Place 1 spray into both nostrils 3 times/day as needed-between meals & bedtime.     Krill Oil 1000 MG CAPS Take 1,000 mg by mouth at bedtime.     Melatonin 10 MG CAPS Take 10 mg by mouth at bedtime.     montelukast (SINGULAIR) 10 MG tablet Take 10 mg by mouth at bedtime.     primidone (MYSOLINE) 50 MG tablet Take 1 tablet (50 mg total) by mouth at bedtime. 90 tablet 1   spironolactone (ALDACTONE) 25 MG tablet Take 0.5 tablets (12.5 mg total) by mouth daily. 90 tablet 0   tadalafil (CIALIS) 5 MG tablet Take 5 mg by mouth daily.     tamsulosin (FLOMAX) 0.4 MG CAPS capsule Take 0.4 mg by mouth 2 (two) times daily after a meal.     Vitamin D, Ergocalciferol, (DRISDOL) 1.25 MG (50000 UT) CAPS capsule Take 50,000 Units by mouth every Monday.     Zinc 50 MG TABS Take 50 mg by mouth at bedtime.     No current facility-administered medications on file prior to visit.   Past Medical History:  Diagnosis Date   Allergic rhinosinusitis    Anxiety    Aortic valve sclerosis  Arthritis    knees, neck, shoulder   CHF (congestive heart failure) (HCC)    Cubital tunnel syndrome    DDD (degenerative disc disease), cervical    Depression    Elevated PSA    denies per patient   HTN (hypertension)    Hyperlipidemia    Hyperplasia of prostate with lower urinary tract symptoms (LUTS)    Hypertension    Mild dilation of ascending aorta (HCC)    Parkinson disease (HCC)    Prolapsed internal hemorrhoids, grade 3 04/19/2015   Rectal prolapse    Scoliosis of lumbosacral spine    Stroke ([place])    x2   Tremor    Vitamin D deficiency    Wears hearing aid    bilateral   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   BENTALL PROCEDURE N/A 05/01/2018   Procedure: BIOLOGICAL BENTALL PROCEDURE AORTIC ROOT REPLACEMENT WITH 30MM VALSALVA GRAFT & 27MM INSPIRIS VALVE.;  Surgeon: Rexene Alberts, MD;  Location:  [place];  Service: Open Heart Surgery;  Laterality: N/A;   BUBBLE STUDY  04/13/2021   Procedure: BUBBLE STUDY;  Surgeon: Buford Dresser, MD;  Location[facility] ENDOSCOPY;  Service: Cardiovascular;;   CATARACT EXTRACTION Bilateral 04/2018   COLONOSCOPY  2007   NASAL SINUS SURGERY  2006   RIGHT/LEFT HEART CATH AND CORONARY ANGIOGRAPHY N/A 04/27/2018   Procedure: RIGHT/LEFT HEART CATH AND CORONARY ANGIOGRAPHY;  Surgeon: Jolaine Artist, MD;  Location: [place] CV LAB;  Service: Cardiovascular;  Laterality: N/A;   TEE WITHOUT CARDIOVERSION N/A 04/27/2018   Procedure: TRANSESOPHAGEAL ECHOCARDIOGRAM (TEE);  Surgeon: Jolaine Artist, MD;  Location: [facility] ENDOSCOPY;  Service: Cardiovascular;  Laterality: N/A;   TEE WITHOUT CARDIOVERSION N/A 05/01/2018   Procedure: TRANSESOPHAGEAL ECHOCARDIOGRAM (TEE);  Surgeon: Rexene Alberts, MD;  Location: [place];  Service: Open Heart Surgery;  Laterality: N/A;   TEE WITHOUT CARDIOVERSION N/A 04/13/2021   Procedure: TRANSESOPHAGEAL ECHOCARDIOGRAM (TEE);  Surgeon: Buford Dresser, MD;  Location: [facility] ENDOSCOPY;  Service: Cardiovascular;  Laterality: N/A;   TRANSANAL HEMORRHOIDAL DEARTERIALIZATION N/A 11/15/2015   Procedure: TRANSANAL HEMORRHOIDAL DEARTERIALIZATION;  Surgeon: Leighton Ruff, MD;  Location: [place];  Service: General;  Laterality: N/A;    Family History  Problem Relation Age of Onset   COPD Mother    Heart attack Mother    Colon polyps Father    Prostate cancer Father    Parkinson's disease Father    Hyperlipidemia Father    Hypertension Sister    Hypertension Brother    Breast cancer Sister    Prostate cancer Paternal Uncle    Social History   Socioeconomic History   Marital status: Single    Spouse name: Not on file   Number of children: 2   Years of education: Not on file   Highest education level: Not on file  Occupational History   Occupation: retired    Comment: security guard  Tobacco Use    Smoking status: Some Days    Packs/day: 0.10    Years: 50.00    Pack years: 5.00    Types: Cigarettes    Last attempt to quit: 05/2018    Years since quitting: 2.9   Smokeless tobacco: Never  Vaping Use   Vaping Use: Former  Substance and Sexual Activity   Alcohol use: Not Currently    Comment: Occasional   Drug use: No   Sexual activity: Not Currently  Other Topics Concern  Not on file  Social History Narrative   Divorced lives with father after incarceration x 5 yrs   1 son, 1 daughter   Volunteer work   3 caffeine/day   04/19/2015   Right handed   Social Determinants of Health   Financial Resource Strain: Not on file  Food Insecurity: No Food Insecurity   Worried About Charity fundraiser in the Last Year: Never true   Ran Out of Food in the Last Year: Never true  Transportation Needs: No Transportation Needs   Lack of Transportation (Medical): No   Lack of Transportation (Non-Medical): No  Physical Activity: Not on file  Stress: Not on file  Social Connections: Not on file    Review of Systems  Constitutional:  Positive for fatigue. Negative for chills and fever.  HENT:  Positive for congestion and rhinorrhea. Negative for sore throat.   Respiratory:  Negative for cough and shortness of breath.   Cardiovascular:  Negative for chest pain and palpitations.  Gastrointestinal:  Negative for abdominal pain, constipation, diarrhea, nausea and vomiting.  Genitourinary:  Negative for dysuria and urgency.  Musculoskeletal:  Positive for myalgias. Negative for arthralgias and back pain.  Neurological:  Positive for tremors, weakness and headaches. Negative for dizziness.  Psychiatric/Behavioral:  Positive for dysphoric mood. The patient is not nervous/anxious.     Objective:  BP 110/64   Pulse 72   Temp (!) 97.5 F (36.4 C)   Resp 16   Ht '6\' 2"'$  (1.88 m)   Wt 185 lb (83.9 kg)   BMI 23.75 kg/m   BP/Weight 04/23/2021 [id number]  Systolic BP [id number]   Diastolic BP 64 71 68  Wt. (Lbs) 185 187 187  BMI 23.75 24.01 24.01    Physical Exam Vitals reviewed.  Constitutional:      Appearance: Normal appearance.  Neck:     Vascular: No carotid bruit.  Cardiovascular:     Rate and Rhythm: Normal rate and regular rhythm.     Heart sounds: Normal heart sounds.  Pulmonary:     Effort: Pulmonary effort is normal.     Breath sounds: Normal breath sounds. No wheezing, rhonchi or rales.  Abdominal:     General: Bowel sounds are normal.     Palpations: Abdomen is soft.     Tenderness: There is no abdominal tenderness.  Neurological:     Mental Status: He is alert.  Psychiatric:        Behavior: Behavior normal.     Comments: Flat affect.     Diabetic Foot Exam - Simple   No data filed      Lab Results  Component Value Date   WBC 6.1 04/19/2021   HGB 13.7 04/19/2021   HCT 40.1 04/19/2021   PLT 193 04/19/2021   GLUCOSE 98 04/19/2021   CHOL 149 04/19/2021   TRIG 66 04/19/2021   HDL 52 04/19/2021   LDLCALC 84 04/19/2021   ALT 3 04/19/2021   AST 11 04/19/2021   NA 139 04/19/2021   K 4.3 04/19/2021   CL 102 04/19/2021   CREATININE 0.75 (L) 04/19/2021   BUN 14 04/19/2021   CO2 21 04/19/2021   TSH 1.240 04/19/2021   INR 1.58 05/01/2018      Assessment & Plan:   1. Patent foramen ovale Pt to follow up with cardiology.   2. Depression, major, recurrent, mild (HCC)  The current medical regimen is effective;  continue present plan and medications.   Follow-up:  Return in about 20 weeks (around 09/10/2021) for fasting.  An After Visit Summary was printed and given to the patient.  Rochel Brome, MD [facility] [phone]

## 2021-04-23 NOTE — Progress Notes (Signed)
Chronic Care Management Pharmacy Note  04/24/2021 Name:  Travis Reese MRN:  [id number] DOB:  07-28-1946  Summary:  Patient reports that he does have some anxiety during the day - could use something occasionally during the day. He previously had an extra 1/2 tablet for occasional daytime use, please advise. GAD-7 score today is 4.  Eats a lot of vegetables, meat. Weight is stable currently. Has stopped doing ensure.  Smokes 1 cigarette a day. Has reduced from 2 packs per day to 1 cigarette. Significant other would like for him to stop completely.  Patient would like to trial off of Krill oil, celebrex and tadalafil if Dr. Tobie Poet approves. Pharmacist will update patient with Dr Cox's response.   Recommendations/Changes made from today's visit: Recommended regular exercise or considering a boxing class.  Recommend considering statin since LDL >70. Patient denies taking cholesterol medication in the past. He reports having mini stroke or stroke and scan noted atherosclerosis.     Subjective: Travis Reese is an 75 y.o. year old male who is a primary patient of Cox, Kirsten, MD.  The CCM team was consulted for assistance with disease management and care coordination needs.    Engaged with patient by telephone for initial visit in response to provider referral for pharmacy case management and/or care coordination services.   Consent to Services:  The patient was given the following information about Chronic Care Management services today, agreed to services, and gave verbal consent: 1. CCM service includes personalized support from designated clinical staff supervised by the primary care provider, including individualized plan of care and coordination with other care providers 2. 24/7 contact phone numbers for assistance for urgent and routine care needs. 3. Service will only be billed when office clinical staff spend 20 minutes or more in a month to coordinate care. 4. Only one practitioner  may furnish and bill the service in a calendar month. 5.The patient may stop CCM services at any time (effective at the end of the month) by phone call to the office staff. 6. The patient will be responsible for cost sharing (co-pay) of up to 20% of the service fee (after annual deductible is met). Patient agreed to services and consent obtained.  Patient Care Team: Rochel Brome, MD as PCP - General (Internal Medicine) Tat, Eustace Quail, DO as Consulting Physician (Neurology) Travis Liter, MD as Consulting Physician (Cardiology) Travis Manns, MD (Orthopedic Surgery) Burnice Logan, [facility] as Pharmacist (Pharmacist)    Recent office visits:  04/10/21- Rochel Brome, MD- seen to establish care/ parkinson's increased duloxetine from 30 mg daily to 60 mg daily, labs ordered, follow up 2 weeks   Recent consult visits:  04/09/21- Cyndy Freeze, MD (Cardiology)-seen for follow-up status post  aortic valve replacement bioprosthetic aortic valve , labs ordered, ZIO patch ordered, no medication changes, follow up 3 months 03/21/21- Landis Martins, DPM (Podiatry)- seen for pain due to onychomycosis of toenails of both feet, debridement performed, no medication changes, follow up 3 months 02/23/21- Wells Guiles Tat, DO (Neurology Video Visit)- seen for parkinson's with tremor, started levodopa 25/100 with titration schedule, referral to cardiology, follow up as scheduled 01/23/21- Orders Only (Cardiology)- 30 Day Cardiac Event Monitor to evaluate CVA 01/18/21- Wells Guiles Tat, DO (Neurology)- seen for parkinson's with tremor, echocardiogram ordered, [place] ordered, no medication changes, no follow up documented   Hospital visits:  Medication Reconciliation was completed by comparing discharge summary, patient's EMR and Pharmacy list, and upon discussion with patient.  Admitted to the hospital on 01/19/21 due to UTI. Discharge date was 01/24/21. Discharged from [place]?Medications  Started at [facility] Discharge:?? cephALEXin 500 mg x 4 days cyanocobalamin 1000 mcg/ml every 30 days feeding supplement 237 ml tid between meals   Medications Discontinued at Hospital Discharge: tadalafil 5 MG    All other medications  remain the same after Hospital Discharge:??      2. 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 Discharge date was 12/20/20.Marland Kitchen Discharged from [place]?Medications Started at [facility] Discharge:?? Cyanocobalamin 1000 mcg/ ml daily x 15 days folic acid 1 mg daily   Medications Discontinued at Hospital Discharge: ARIPiprazole 10 MG due to induced parkinson's   All other medications remain the same after Hospital Discharge:??    Objective:  Lab Results  Component Value Date   CREATININE 0.75 (L) 04/19/2021   BUN 14 04/19/2021   GFRNONAA >60 01/22/2021   GFRAA >60 05/15/2018   NA 139 04/19/2021   K 4.3 04/19/2021   CALCIUM 9.8 04/19/2021   CO2 21 04/19/2021   GLUCOSE 98 04/19/2021    No results found for: HGBA1C, FRUCTOSAMINE, GFR, MICROALBUR  Last diabetic Eye exam: No results found for: HMDIABEYEEXA  Last diabetic Foot exam: No results found for: HMDIABFOOTEX   Lab Results  Component Value Date   CHOL 149 04/19/2021   HDL 52 04/19/2021   LDLCALC 84 04/19/2021   TRIG 66 04/19/2021   CHOLHDL 2.9 04/19/2021    Hepatic Function Latest Ref Rng & Units 04/19/2021 01/20/2021 01/19/2021  Total Protein 6.0 - 8.5 g/dL 6.7 6.1(L) 6.4(L)  Albumin 3.7 - 4.7 g/dL 4.3 2.9(L) 3.1(L)  AST 0 - 40 IU/L _0 ALT 0 - 44 IU/L _1 Alk Phosphatase 44 - 121 IU/L 77 59 60  Total Bilirubin 0.0 - 1.2 mg/dL 0.6 1.3(H) 1.3(H)    Lab Results  Component Value Date/Time   TSH 1.240 04/19/2021 10:06 AM   TSH 2.294 01/21/2021 12:34 AM   TSH 2.17 03/30/2020 10:13 AM    CBC Latest Ref Rng & Units 04/19/2021 04/09/2021 01/22/2021  WBC 3.4 - 10.8 x10E3/uL 6.1 7.1 5.8  Hemoglobin 13.0 - 17.7 g/dL 13.7  13.8 12.7(L)  Hematocrit 37.5 - 51.0 % 40.1 39.3 35.7(L)  Platelets 150 - 450 x10E3/uL 193 180 158    Lab Results  Component Value Date/Time   VD25OH 53.9 04/19/2021 10:06 AM   VD25OH 70.03 12/16/2020 12:16 PM    Clinical ASCVD: Yes  The ASCVD Risk score Mikey Bussing DC Jr., et al., 2013) failed to calculate for the following reasons:   The patient has a prior MI or stroke diagnosis    Depression screen [facility] 2/9 04/10/2021 02/01/2015  Decreased Interest 0 0  Down, Depressed, Hopeless 0 0  PHQ - 2 Score 0 0     Other: (CHADS2VASc if Afib, MMRC or CAT for COPD, ACT, DEXA)  Social History   Tobacco Use  Smoking Status Some Days   Packs/day: 0.10   Years: 50.00   Pack years: 5.00   Types: Cigarettes   Last attempt to quit: 05/2018   Years since quitting: 2.9  Smokeless Tobacco Never   BP Readings from Last 3 Encounters:  04/23/21 110/64  04/13/21 (!) 148/71  04/10/21 126/68   Pulse Readings from Last 3 Encounters:  04/23/21 72  04/13/21 (!) 56  04/10/21 74  Wt Readings from Last 3 Encounters:  04/23/21 185 lb (83.9 kg)  04/13/21 187 lb (84.8 kg)  04/10/21 187 lb (84.8 kg)   BMI Readings from Last 3 Encounters:  04/23/21 23.75 kg/m  04/13/21 24.01 kg/m  04/10/21 24.01 kg/m    Assessment/Interventions: Review of patient past medical history, allergies, medications, health status, including review of consultants reports, laboratory and other test data, was performed as part of comprehensive evaluation and provision of chronic care management services.   SDOH:  (Social Determinants of Health) assessments and interventions performed: Yes  SDOH Screenings   Alcohol Screen: Not on file  Depression (PHQ2-9): Low Risk    PHQ-2 Score: 0  Financial Resource Strain: Not on file  Food Insecurity: No Food Insecurity   Worried About Charity fundraiser in the Last Year: Never true   Ran Out of Food in the Last Year: Never true  Housing: Low Risk    Last Housing Risk Score:  0  Physical Activity: Not on file  Social Connections: Not on file  Stress: Not on file  Tobacco Use: High Risk   Smoking Tobacco Use: Some Days   Smokeless Tobacco Use: Never  Transportation Needs: No Transportation Needs   Lack of Transportation (Medical): No   Lack of Transportation (Non-Medical): No    CCM Care Plan  No Known Allergies  Medications Reviewed Today     Reviewed by Burnice Logan, [facility] (Pharmacist) on 04/24/21 at [phone]  Med List Status: <None>   Medication Order Taking? Sig Documenting Provider Last Dose Status Informant  acetaminophen (TYLENOL) 325 MG tablet [id number] Yes Take 2 tablets (650 mg total) by mouth every 6 (six) hours as needed for mild pain (or Fever >/= 101). Elgergawy, Silver Huguenin, MD Taking Active   albuterol (VENTOLIN HFA) 108 (90 Base) MCG/ACT inhaler [id number] Yes Inhale 2 puffs into the lungs every 6 (six) hours as needed for wheezing or shortness of breath. [provider] Taking Active Self  aspirin EC 325 MG EC tablet [id number] Yes Take 1 tablet (325 mg total) by mouth daily. Barrett, Lodema Hong, PA-C Taking Active Self  carbidopa-levodopa (SINEMET IR) 25-100 MG tablet [id number] Yes Take 1/2 tablet at 7am/11am/4pm x 1 week, Then take 1/2 tablet at 7am,  1/2 tablet at 11am, 1 tablet at 4pm, at least 30 minutes before meals x 1 week, then take 1/2 tablet at 7am, 1 tablet at 11am, 1 at 4pm, at least 30 minutes before meals, x 1 week, then 1 po at 7am/11am/4pm  Patient taking differently: Take 1 tablet by mouth 3 (three) times daily. 1 po at 7am/11am/4pm   Tat, Eustace Quail, DO Taking Active   carvedilol (COREG) 25 MG tablet [id number] Yes Take 1 tablet (25 mg total) by mouth 2 (two) times daily with a meal. Must keep scheduled appointment for further refills Travis Liter, MD Taking Active   celecoxib (CELEBREX) 100 MG capsule [id number] Yes Take 100 mg by mouth every morning. [provider] Taking Active Self  clonazePAM (KLONOPIN) 1 MG  tablet [id number] Yes Take 1 tablet (1 mg total) by mouth at bedtime. Oswald Hillock, MD Taking Active   DULoxetine (CYMBALTA) 60 MG capsule [id number] Yes Take 1 capsule (60 mg total) by mouth daily. Cox, Kirsten, MD Taking Active   fluticasone [facility]) 50 MCG/ACT nasal spray [id number] Yes Place 1 spray into both nostrils 3 times/day as needed-between meals & bedtime. [provider] Taking Active Self  Krill Oil 1000 MG CAPS  [id number] Yes Take 1,000 mg by mouth at bedtime. [provider] Taking Active Self  Melatonin 10 MG CAPS [id number] Yes Take 10 mg by mouth at bedtime. [provider] Taking Active Self  montelukast (SINGULAIR) 10 MG tablet [id number] Yes Take 10 mg by mouth at bedtime. [provider] Taking Active Self  primidone (MYSOLINE) 50 MG tablet [id number] Yes Take 1 tablet (50 mg total) by mouth at bedtime. Ludwig Clarks, DO Taking Active Self  spironolactone (ALDACTONE) 25 MG tablet [id number] Yes Take 0.5 tablets (12.5 mg total) by mouth daily. Cox, Kirsten, MD Taking Active   tadalafil (CIALIS) 5 MG tablet [id number] Yes Take 5 mg by mouth daily. [provider] Taking Active   tamsulosin (FLOMAX) 0.4 MG CAPS capsule [id number] Yes Take 0.4 mg by mouth 2 (two) times daily after a meal. [provider] Taking Active Self  Vitamin D, Ergocalciferol, (DRISDOL) 1.25 MG (50000 UT) CAPS capsule [id number] Yes Take 50,000 Units by mouth every Monday. [provider] Taking Active Self  Zinc 50 MG TABS [id number] Yes Take 50 mg by mouth at bedtime. 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   Arthritis 04/02/2021   Aortic valve sclerosis 04/02/2021   CHF (congestive heart failure) ([place]) 04/02/2021   Parkinson's disease ([place]) 02/23/2021   Pressure injury of skin 01/20/2021   Depression    History of CVA (cerebrovascular accident)    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   Tremor    Weakness 12/15/2020   S/P aortic valve replacement with bioprosthetic valve 05/01/2018   HTN (hypertension) 04/19/2018   Chronic diastolic CHF (congestive heart failure) ([place]) 04/19/2018   BPH (benign prostatic hyperplasia) 04/19/2018   Anxiety 04/19/2018   Mixed hyperlipidemia 09/23/2016   Other fatigue 07/23/2016   Restless leg syndrome 07/10/2016   Plantar wart of left foot 01/05/2016   Prolapsed internal hemorrhoids, grade 3 04/19/2015    Immunization History  Administered Date(s) Administered   Influenza, High Dose Seasonal PF 06/16/2016   Influenza,inj,quad, With Preservative 05/24/2019   PFIZER(Purple Top)SARS-COV-2 Vaccination 11/01/2019, 11/30/2019   Zoster Recombinat (Shingrix) 11/24/2017, 04/02/2018    Conditions to be addressed/monitored:  Hypertension, Hyperlipidemia, Heart Failure, Anxiety, BPH, and Parkinson's disease  Care Plan : ccm pharmacy care plan  Updates made by Burnice Logan, [place] since 04/24/2021 12:00 AM     Problem: anxiety, bp, hld, heart failure   Priority: High  Onset Date: 04/24/2021     Long-Range Goal: Disease State Managment   Start Date: 04/24/2021  Expected End Date: 04/24/2022  This Visit's Progress: On track  Priority: High  Note:   Current Barriers:  Unable to maintain control of cholesterol  Pharmacist Clinical Goal(s):  Patient will maintain control of cholesterol as evidenced by lipid panel  through collaboration with PharmD and provider.   Interventions: 1:1 collaboration with Rochel Brome, MD regarding development and update of comprehensive plan of care as evidenced by provider attestation and  co-signature Inter-disciplinary care team collaboration (see longitudinal plan of care) Comprehensive medication review performed; medication list updated in electronic medical record  Hypertension (BP goal <140/90) -Controlled -Current treatment: Carvedilol 25 mg bi dwith a meal  Spironolactone 25 mg daily  -Medications previously tried: furosemide, lisinopril-hydrochlorothiazide -Current home readings: not checking often -Current dietary habits: eats meats and vegetables per patient. Eating 3 meals daily.  -Current exercise habits: very limited. Encouraged him to consider boxing class or other exercise.  -Denies hypotensive/hypertensive symptoms -Educated on BP goals and benefits of medications for prevention of heart attack, stroke and kidney damage; Daily salt intake goal < 2300 mg; Exercise goal of 150 minutes per week; -Counseled to monitor BP at home as needed, document, and provide log at future appointments -Counseled on diet and exercise extensively Recommended to continue current medication  Hyperlipidemia: (LDL goal < 70) -Not ideally controlled -Current treatment: Diet/lifestyle -Medications previously tried: patient has never been on statin  -Current dietary habits: eats meats and vegetables per patient. Eating 3 meals daily.  -Current exercise habits: very limited. Encouraged him to consider boxing class or other exercise.  -Educated on Cholesterol goals;  Importance of limiting foods high in cholesterol; Exercise goal of 150 minutes per week; -Counseled on diet and exercise extensively Recommended considering statin to achieve ldl goal of <70.   Heart Failure (Goal: manage symptoms and prevent exacerbations) -Controlled -Last ejection fraction: 55-60% (Date: 05/22) -Current treatment: carvedilol 25 mg bid with a meal  Spironolactone 25 mg 1/2 tablet daily  -Medications previously tried: furosemide, lisinopril-hydrochlorothiazide -Current home BP/HR readings:  not checking currently  -Current dietary habits: eats meats and vegetables per patient. Eating 3 meals daily.  -Current exercise habits: very limited. Encouraged him to consider boxing class or other exercise.  -Educated on Benefits of medications for managing symptoms and prolonging life -Counseled on diet and exercise extensively Recommended to continue current medication  Arthritis (Goal: manage symptoms) -Controlled -Current treatment  acetaminophen 325 mg 2 tablets every 6 hours prn mild pain or fever - rarely takes Celecoxib 100 mg daily am  -Medications previously tried:  voltaren, meloxicam, naproxen, oxycodone, tiaznidine, pregabalin -Patient is interested in stopping Celebrex and monitoring symptoms if Dr. Tobie Poet approves.   Depression/Anxiety (Goal: manage symptoms of anxiety) -Controlled -Current treatment: duloxetine 60 mg daily  Clonazepam 1 mg daily at bedtime  -Medications previously tried/failed: abilify, bupropion, seroquel -PHQ9: 0 -GAD7: 4 -Educated on Benefits of medication for symptom control -Assessed patient's symptom control. Patient reports previously prescribed an extra Clonazepam 1/2 tablet for during the day symptoms as needed. He feels that this would be helpful if Dr. Tobie Poet approves.   Parkinson's Disease (Goal: improve symptoms - managed by Dr Tat) -Controlled -Current treatment  Sinemet IR 25-100 mg 1 tablet tid (7a/11a/4p) Primidone 50 mg daily at bedtime   -Medications previously tried: none reported -Recommended to continue current medication  Allergic Rhinosinusitis (Goal: manage symptoms) -Controlled -Current treatment  albuterol inhaler 2 puffs every 6 hours prn wheezing shortness of breath  Flonase nasal spray at bedtime prn  Montelukast 10 mg daily at bedtime -Medications previously tried:  azeliastine, saline nasal spray -Recommended to continue current medication   BPH (Goal: manage symptoms) -Controlled -Current treatment   tadalafil 5 mg daily - trial off of it for efficacy Tamsulosin 0.4 mg bid after a meal  -Medications previously tried:  terazosin -Patient reports tadalafil is one of his more expensive medications and not sure he sees the benefit. Would like to trial stop taking and see if symptoms change with Dr. [name] approval.   Health Maintenance -Vaccine gaps: Recommend considering COVID and pneumonia shot.  -  Current therapy:  krill oil 1000 mg daily ate bedtime  Melatonin 10 mg daily at bedtime  Vitamin D 50,000 units weekly on Mondays Zinc 50 mg daily at bedtime  aspirin ec 326 mg daily  -Educated on Cost vs benefit of each product must be carefully weighed by individual consumer -Patient would like to consider trialing off of Krill Oil.  -Recommended to continue current medication  Patient Goals/Self-Care Activities Patient will:  - take medications as prescribed focus on medication adherence by using pill box  target a minimum of 150 minutes of moderate intensity exercise weekly  Follow Up Plan: Telephone follow up appointment with care management team member scheduled for:        Medication Assistance: None required.  Patient affirms current coverage meets needs.  Compliance/Adherence/Medication fill history: Care Gaps: Last annual wellness visit? Upcoming 69/67/89 If applicable: N/A Last eye exam / retinopathy screening? Last diabetic foot exam?  Star-Rating Drugs: None noted  Patient's preferred pharmacy is:  [employer]  ([place]) - [place], [place] [facility] [address] [place] [address] [place] [address] Phone: [phone] Fax: [phone]  [facility] [place], [address] [place] [address] Phone: [phone] Fax: [phone]  [place], [address]. [place] [address] Phone: [phone]  Fax: [phone]  Uses pill box? Yes Pt endorses good compliance  We discussed: Current pharmacy is preferred with insurance plan and patient is satisfied with pharmacy services Patient decided to: Continue current medication management strategy  Care Plan and Follow Up Patient Decision:  Patient agrees to Care Plan and Follow-up.  Plan: Telephone follow up appointment with care management team member scheduled for:  10/2021

## 2021-04-24 ENCOUNTER — Ambulatory Visit (INDEPENDENT_AMBULATORY_CARE_PROVIDER_SITE_OTHER): Payer: Medicare Other

## 2021-04-24 DIAGNOSIS — E782 Mixed hyperlipidemia: Secondary | ICD-10-CM

## 2021-04-24 DIAGNOSIS — I110 Hypertensive heart disease with heart failure: Secondary | ICD-10-CM

## 2021-04-24 DIAGNOSIS — I5032 Chronic diastolic (congestive) heart failure: Secondary | ICD-10-CM | POA: Diagnosis not present

## 2021-04-24 DIAGNOSIS — F419 Anxiety disorder, unspecified: Secondary | ICD-10-CM

## 2021-04-24 DIAGNOSIS — G20 Parkinson's disease: Secondary | ICD-10-CM

## 2021-04-24 NOTE — Patient Instructions (Addendum)
Visit Information  Thank you for your time discussing your medications. I look forward to working with you to achieve your health care goals. Below is a summary of what we talked about during our visit.    Goals Addressed             This Visit's Progress    Lifestyle Change-Hypertension       Timeframe:  Long-Range Goal Priority:  High Start Date:                             Expected End Date:                       Follow Up Date 10/2021    - ask questions to understand    Why is this important?   The changes that you are asked to make may be hard to do.  This is especially true when the changes are life-long.  Knowing why it is important to you is the first step.  Working on the change with your family or support person helps you not feel alone.  Reward yourself and family or support person when goals are met. This can be an activity you choose like bowling, hiking, biking, swimming or shooting hoops.     Notes:      Manage My Medicine       Timeframe:  Long-Range Goal Priority:  High Start Date:                             Expected End Date:                       Follow Up Date 12/2021 with pharmacist     - call for medicine refill 2 or 3 days before it runs out - keep a list of all the medicines I take; vitamins and herbals too - use a pillbox to sort medicine    Why is this important?   These steps will help you keep on track with your medicines.   Notes:      Track and Manage My Blood Pressure-Hypertension       Timeframe:  Long-Range Goal Priority:  High Start Date:                             Expected End Date:                       Follow Up Date 10/2021    - check blood pressure weekly    Why is this important?   You won't feel high blood pressure, but it can still hurt your blood vessels.  High blood pressure can cause heart or kidney problems. It can also cause a stroke.  Making lifestyle changes like losing a little weight or eating less salt  will help.  Checking your blood pressure at home and at different times of the day can help to control blood pressure.  If the doctor prescribes medicine remember to take it the way the doctor ordered.  Call the office if you cannot afford the medicine or if there are questions about it.     Notes:         Patient Care Plan: ccm pharmacy care plan     Problem Identified:  anxiety, bp, hld, heart failure   Priority: High  Onset Date: 04/24/2021     Long-Range Goal: Disease State Managment   Start Date: 04/24/2021  Expected End Date: 04/24/2022  This Visit's Progress: On track  Priority: High  Note:   Current Barriers:  Unable to maintain control of cholesterol  Pharmacist Clinical Goal(s):  Patient will maintain control of cholesterol as evidenced by lipid panel  through collaboration with PharmD and provider.   Interventions: 1:1 collaboration with Cox, Kirsten, MD regarding development and update of comprehensive plan of care as evidenced by provider attestation and co-signature Inter-disciplinary care team collaboration (see longitudinal plan of care) Comprehensive medication review performed; medication list updated in electronic medical record  Hypertension (BP goal <140/90) -Controlled -Current treatment: Carvedilol 25 mg bi dwith a meal  Spironolactone 25 mg daily  -Medications previously tried: furosemide, lisinopril-hydrochlorothiazide -Current home readings: not checking often -Current dietary habits: eats meats and vegetables per patient. Eating 3 meals daily.  -Current exercise habits: very limited. Encouraged him to consider boxing class or other exercise.  -Denies hypotensive/hypertensive symptoms -Educated on BP goals and benefits of medications for prevention of heart attack, stroke and kidney damage; Daily salt intake goal < 2300 mg; Exercise goal of 150 minutes per week; -Counseled to monitor BP at home as needed, document, and provide log at future  appointments -Counseled on diet and exercise extensively Recommended to continue current medication  Hyperlipidemia: (LDL goal < 70) -Not ideally controlled -Current treatment: Diet/lifestyle -Medications previously tried: patient has never been on statin  -Current dietary habits: eats meats and vegetables per patient. Eating 3 meals daily.  -Current exercise habits: very limited. Encouraged him to consider boxing class or other exercise.  -Educated on Cholesterol goals;  Importance of limiting foods high in cholesterol; Exercise goal of 150 minutes per week; -Counseled on diet and exercise extensively Recommended considering statin to achieve ldl goal of <70.   Heart Failure (Goal: manage symptoms and prevent exacerbations) -Controlled -Last ejection fraction: 55-60% (Date: 05/22) -Current treatment: carvedilol 25 mg bid with a meal  Spironolactone 25 mg 1/2 tablet daily  -Medications previously tried: furosemide, lisinopril-hydrochlorothiazide -Current home BP/HR readings: not checking currently  -Current dietary habits: eats meats and vegetables per patient. Eating 3 meals daily.  -Current exercise habits: very limited. Encouraged him to consider boxing class or other exercise.  -Educated on Benefits of medications for managing symptoms and prolonging life -Counseled on diet and exercise extensively Recommended to continue current medication  Arthritis (Goal: manage symptoms) -Controlled -Current treatment  acetaminophen 325 mg 2 tablets every 6 hours prn mild pain or fever - rarely takes Celecoxib 100 mg daily am  -Medications previously tried:  voltaren, meloxicam, naproxen, oxycodone, tiaznidine, pregabalin -Patient is interested in stopping Celebrex and monitoring symptoms if Dr. Tobie Poet approves.   Depression/Anxiety (Goal: manage symptoms of anxiety) -Controlled -Current treatment: duloxetine 60 mg daily  Clonazepam 1 mg daily at bedtime  -Medications previously  tried/failed: abilify, bupropion, seroquel -PHQ9: 0 -GAD7: 4 -Educated on Benefits of medication for symptom control -Assessed patient's symptom control. Patient reports previously prescribed an extra Clonazepam 1/2 tablet for during the day symptoms as needed. He feels that this would be helpful if Dr. Tobie Poet approves.   Parkinson's Disease (Goal: improve symptoms - managed by Dr Tat) -Controlled -Current treatment  Sinemet IR 25-100 mg 1 tablet tid (7a/11a/4p) Primidone 50 mg daily at bedtime   -Medications previously tried: none reported -Recommended to continue current medication  Allergic Rhinosinusitis (Goal:  manage symptoms) -Controlled -Current treatment  albuterol inhaler 2 puffs every 6 hours prn wheezing shortness of breath  Flonase nasal spray at bedtime prn  Montelukast 10 mg daily at bedtime -Medications previously tried:  azeliastine, saline nasal spray -Recommended to continue current medication   BPH (Goal: manage symptoms) -Controlled -Current treatment  tadalafil 5 mg daily - trial off of it for efficacy Tamsulosin 0.4 mg bid after a meal  -Medications previously tried:  terazosin -Patient reports tadalafil is one of his more expensive medications and not sure he sees the benefit. Would like to trial stop taking and see if symptoms change with Dr. [name] approval.   Health Maintenance -Vaccine gaps: Recommend considering COVID and pneumonia shot.  -Current therapy:  krill oil 1000 mg daily ate bedtime  Melatonin 10 mg daily at bedtime  Vitamin D 50,000 units weekly on Mondays Zinc 50 mg daily at bedtime  aspirin ec 326 mg daily  -Educated on Cost vs benefit of each product must be carefully weighed by individual consumer -Patient would like to consider trialing off of Krill Oil.  -Recommended to continue current medication  Patient Goals/Self-Care Activities Patient will:  - take medications as prescribed focus on medication adherence by using pill box   target a minimum of 150 minutes of moderate intensity exercise weekly  Follow Up Plan: Telephone follow up appointment with care management team member scheduled for:       Mr. Batterton was given information about Chronic Care Management services today including:  CCM service includes personalized support from designated clinical staff supervised by his physician, including individualized plan of care and coordination with other care providers 24/7 contact phone numbers for assistance for urgent and routine care needs. Standard insurance, coinsurance, copays and deductibles apply for chronic care management only during months in which we provide at least 20 minutes of these services. Most insurances cover these services at 100%, however patients may be responsible for any copay, coinsurance and/or deductible if applicable. This service may help you avoid the need for more expensive face-to-face services. Only one practitioner may furnish and bill the service in a calendar month. The patient may stop CCM services at any time (effective at the end of the month) by phone call to the office staff.  Patient agreed to services and verbal consent obtained.   Patient verbalizes understanding of instructions provided today and agrees to view in [facility].  Telephone follow up appointment with pharmacy team member scheduled for:  Sherre Poot, PharmD Clinical Pharmacist [place] [phone] (office) [phone] (mobile)

## 2021-04-25 ENCOUNTER — Encounter: Payer: Self-pay | Admitting: Family Medicine

## 2021-04-27 DIAGNOSIS — M542 Cervicalgia: Secondary | ICD-10-CM | POA: Diagnosis not present

## 2021-04-27 DIAGNOSIS — Z953 Presence of xenogenic heart valve: Secondary | ICD-10-CM | POA: Diagnosis not present

## 2021-04-27 DIAGNOSIS — Z8673 Personal history of transient ischemic attack (TIA), and cerebral infarction without residual deficits: Secondary | ICD-10-CM | POA: Diagnosis not present

## 2021-04-27 DIAGNOSIS — M47892 Other spondylosis, cervical region: Secondary | ICD-10-CM | POA: Diagnosis not present

## 2021-04-27 DIAGNOSIS — M48062 Spinal stenosis, lumbar region with neurogenic claudication: Secondary | ICD-10-CM | POA: Diagnosis not present

## 2021-05-07 ENCOUNTER — Other Ambulatory Visit: Payer: Self-pay | Admitting: Cardiology

## 2021-05-09 ENCOUNTER — Telehealth: Payer: Self-pay

## 2021-05-09 DIAGNOSIS — H16103 Unspecified superficial keratitis, bilateral: Secondary | ICD-10-CM | POA: Diagnosis not present

## 2021-05-09 DIAGNOSIS — H1820 Unspecified corneal edema: Secondary | ICD-10-CM | POA: Diagnosis not present

## 2021-05-09 NOTE — Telephone Encounter (Signed)
Spoke with patient regarding results and recommendation.  Patient verbalizes understanding and is agreeable to plan of care. Advised patient to call back with any issues or concerns.  

## 2021-05-09 NOTE — Telephone Encounter (Signed)
-----   Message from Park Liter, MD sent at 05/09/2021 12:21 PM EDT ----- Monitor showed episode of ventricular tachycardia but only 4 beats, he is already on beta-blocker, there are also episodes of SVT, no atrial fibrillation

## 2021-05-12 ENCOUNTER — Other Ambulatory Visit: Payer: Self-pay | Admitting: Family Medicine

## 2021-05-12 MED ORDER — CLONAZEPAM 1 MG PO TABS: ORAL_TABLET | ORAL | 2 refills | Status: DC

## 2021-05-15 ENCOUNTER — Encounter: Payer: Self-pay | Admitting: Nurse Practitioner

## 2021-05-15 ENCOUNTER — Other Ambulatory Visit: Payer: Self-pay

## 2021-05-15 ENCOUNTER — Ambulatory Visit (INDEPENDENT_AMBULATORY_CARE_PROVIDER_SITE_OTHER): Payer: Medicare Other | Admitting: Nurse Practitioner

## 2021-05-15 VITALS — BP 146/96 | HR 71 | Temp 97.4°F | Ht 74.0 in | Wt 185.0 lb

## 2021-05-15 DIAGNOSIS — W19XXXA Unspecified fall, initial encounter: Secondary | ICD-10-CM

## 2021-05-15 DIAGNOSIS — F32A Depression, unspecified: Secondary | ICD-10-CM | POA: Diagnosis not present

## 2021-05-15 DIAGNOSIS — Z4789 Encounter for other orthopedic aftercare: Secondary | ICD-10-CM | POA: Diagnosis not present

## 2021-05-15 DIAGNOSIS — J45909 Unspecified asthma, uncomplicated: Secondary | ICD-10-CM | POA: Diagnosis not present

## 2021-05-15 DIAGNOSIS — D62 Acute posthemorrhagic anemia: Secondary | ICD-10-CM | POA: Diagnosis not present

## 2021-05-15 DIAGNOSIS — J449 Chronic obstructive pulmonary disease, unspecified: Secondary | ICD-10-CM | POA: Diagnosis not present

## 2021-05-15 DIAGNOSIS — I509 Heart failure, unspecified: Secondary | ICD-10-CM | POA: Diagnosis not present

## 2021-05-15 DIAGNOSIS — M25552 Pain in left hip: Secondary | ICD-10-CM

## 2021-05-15 DIAGNOSIS — S72002D Fracture of unspecified part of neck of left femur, subsequent encounter for closed fracture with routine healing: Secondary | ICD-10-CM | POA: Diagnosis not present

## 2021-05-15 DIAGNOSIS — I110 Hypertensive heart disease with heart failure: Secondary | ICD-10-CM | POA: Diagnosis not present

## 2021-05-15 DIAGNOSIS — K5900 Constipation, unspecified: Secondary | ICD-10-CM | POA: Diagnosis not present

## 2021-05-15 DIAGNOSIS — I739 Peripheral vascular disease, unspecified: Secondary | ICD-10-CM | POA: Diagnosis not present

## 2021-05-15 DIAGNOSIS — S72012A Unspecified intracapsular fracture of left femur, initial encounter for closed fracture: Secondary | ICD-10-CM | POA: Diagnosis not present

## 2021-05-15 DIAGNOSIS — Z9889 Other specified postprocedural states: Secondary | ICD-10-CM | POA: Diagnosis not present

## 2021-05-15 DIAGNOSIS — G8929 Other chronic pain: Secondary | ICD-10-CM | POA: Diagnosis not present

## 2021-05-15 DIAGNOSIS — Z471 Aftercare following joint replacement surgery: Secondary | ICD-10-CM | POA: Diagnosis not present

## 2021-05-15 DIAGNOSIS — G20 Parkinson's disease: Secondary | ICD-10-CM

## 2021-05-15 DIAGNOSIS — M1612 Unilateral primary osteoarthritis, left hip: Secondary | ICD-10-CM | POA: Diagnosis not present

## 2021-05-15 DIAGNOSIS — R0602 Shortness of breath: Secondary | ICD-10-CM | POA: Diagnosis not present

## 2021-05-15 DIAGNOSIS — F17210 Nicotine dependence, cigarettes, uncomplicated: Secondary | ICD-10-CM | POA: Diagnosis not present

## 2021-05-15 DIAGNOSIS — M84652A Pathological fracture in other disease, left femur, initial encounter for fracture: Secondary | ICD-10-CM | POA: Diagnosis not present

## 2021-05-15 DIAGNOSIS — I10 Essential (primary) hypertension: Secondary | ICD-10-CM | POA: Diagnosis not present

## 2021-05-15 DIAGNOSIS — G20A1 Parkinson's disease without dyskinesia, without mention of fluctuations: Secondary | ICD-10-CM

## 2021-05-15 DIAGNOSIS — M1990 Unspecified osteoarthritis, unspecified site: Secondary | ICD-10-CM | POA: Diagnosis not present

## 2021-05-15 DIAGNOSIS — Z8673 Personal history of transient ischemic attack (TIA), and cerebral infarction without residual deficits: Secondary | ICD-10-CM | POA: Diagnosis not present

## 2021-05-15 DIAGNOSIS — F17200 Nicotine dependence, unspecified, uncomplicated: Secondary | ICD-10-CM | POA: Diagnosis not present

## 2021-05-15 DIAGNOSIS — Z79899 Other long term (current) drug therapy: Secondary | ICD-10-CM | POA: Diagnosis not present

## 2021-05-15 DIAGNOSIS — W19XXXD Unspecified fall, subsequent encounter: Secondary | ICD-10-CM | POA: Diagnosis not present

## 2021-05-15 DIAGNOSIS — Z79891 Long term (current) use of opiate analgesic: Secondary | ICD-10-CM | POA: Diagnosis not present

## 2021-05-15 DIAGNOSIS — K219 Gastro-esophageal reflux disease without esophagitis: Secondary | ICD-10-CM | POA: Diagnosis not present

## 2021-05-15 DIAGNOSIS — S72002A Fracture of unspecified part of neck of left femur, initial encounter for closed fracture: Secondary | ICD-10-CM | POA: Diagnosis not present

## 2021-05-15 DIAGNOSIS — Z96642 Presence of left artificial hip joint: Secondary | ICD-10-CM | POA: Diagnosis not present

## 2021-05-15 NOTE — Progress Notes (Signed)
Acute Office Visit  Subjective:    Patient ID: Travis Reese, male    DOB: 07/15/1946, 75 y.o.   MRN: [id number]  Chief Complaint  Patient presents with   [place] last Wednesday    HPI Patient is in today for evaluation of left hip pain s/p fall one week ago. Treatment has included Tylenol. [place] has a past history of Parkinson Disease. States at approximately 3 am on 05/09/21 he attempted to sit on edge of bed to go to the bathroom. States he slid to the edge of the bed to stand but landed on the floor. States he struck his head on the night stand. He denies LOC. Pt is ambulating with walker in-office today. States left hip pain is aggravated by weight-bearing, ambulation, movement, and palpation. Nothing alleviates pain.   Past Medical History:  Diagnosis Date   Allergic rhinosinusitis    Anxiety    Aortic valve sclerosis    Arthritis    knees, neck, shoulder   CHF (congestive heart failure) (HCC)    Cubital tunnel syndrome    DDD (degenerative disc disease), cervical    Depression    Elevated PSA    denies per patient   HTN (hypertension)    Hyperlipidemia    Hyperplasia of prostate with lower urinary tract symptoms (LUTS)    Hypertension    Mild dilation of ascending aorta (HCC)    Parkinson disease (HCC)    Prolapsed internal hemorrhoids, grade 3 04/19/2015   Rectal prolapse    Scoliosis of lumbosacral spine    Stroke ([place])    x2   Tremor    Vitamin D deficiency    Wears hearing aid    bilateral    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   BENTALL PROCEDURE N/A 05/01/2018   Procedure: BIOLOGICAL BENTALL PROCEDURE AORTIC ROOT REPLACEMENT WITH 30MM VALSALVA GRAFT & 27MM INSPIRIS VALVE.;  Surgeon: Rexene Alberts, MD;  Location: [facility];  Service: Open Heart Surgery;  Laterality: N/A;   BUBBLE STUDY  04/13/2021   Procedure: BUBBLE STUDY;  Surgeon: Buford Dresser, MD;  Location: [facility] ENDOSCOPY;  Service:  Cardiovascular;;   CATARACT EXTRACTION Bilateral 04/2018   COLONOSCOPY  2007   NASAL SINUS SURGERY  2006   RIGHT/LEFT HEART CATH AND CORONARY ANGIOGRAPHY N/A 04/27/2018   Procedure: RIGHT/LEFT HEART CATH AND CORONARY ANGIOGRAPHY;  Surgeon: Jolaine Artist, MD;  Location: [place] CV LAB;  Service: Cardiovascular;  Laterality: N/A;   TEE WITHOUT CARDIOVERSION N/A 04/27/2018   Procedure: TRANSESOPHAGEAL ECHOCARDIOGRAM (TEE);  Surgeon: Jolaine Artist, MD;  Location: [facility] ENDOSCOPY;  Service: Cardiovascular;  Laterality: N/A;   TEE WITHOUT CARDIOVERSION N/A 05/01/2018   Procedure: TRANSESOPHAGEAL ECHOCARDIOGRAM (TEE);  Surgeon: Rexene Alberts, MD;  Location: [place];  Service: Open Heart Surgery;  Laterality: N/A;   TEE WITHOUT CARDIOVERSION N/A 04/13/2021   Procedure: TRANSESOPHAGEAL ECHOCARDIOGRAM (TEE);  Surgeon: Buford Dresser, MD;  Location: [facility] ENDOSCOPY;  Service: Cardiovascular;  Laterality: N/A;   TRANSANAL HEMORRHOIDAL DEARTERIALIZATION N/A 11/15/2015   Procedure: TRANSANAL HEMORRHOIDAL DEARTERIALIZATION;  Surgeon: Leighton Ruff, MD;  Location: [facility];  Service: General;  Laterality: N/A;    Family History  Problem Relation Age of Onset   COPD Mother    Heart attack Mother    Colon polyps Father    Prostate cancer Father    Parkinson's disease Father    Hyperlipidemia Father    Hypertension Sister  Hypertension Brother    Breast cancer Sister    Prostate cancer Paternal Uncle     Social History   Socioeconomic History   Marital status: Single    Spouse name: Not on file   Number of children: 2   Years of education: Not on file   Highest education level: Not on file  Occupational History   Occupation: retired    Comment: security guard  Tobacco Use   Smoking status: Some Days    Packs/day: 0.10    Years: 50.00    Pack years: 5.00    Types: Cigarettes    Last attempt to quit: 05/2018    Years since quitting: 3.0   Smokeless  tobacco: Never  Vaping Use   Vaping Use: Former  Substance and Sexual Activity   Alcohol use: Not Currently    Comment: Occasional   Drug use: No   Sexual activity: Not Currently  Other Topics Concern   Not on file  Social History Narrative   Divorced lives with father after incarceration x 5 yrs   1 son, 1 daughter   Psychologist, occupational work   3 caffeine/day   04/19/2015   Right handed   Social Determinants of Health   Financial Resource Strain: Not on file  Food Insecurity: No Food Insecurity   Worried About Charity fundraiser in the Last Year: Never true   Temperance in the Last Year: Never true  Transportation Needs: No Transportation Needs   Lack of Transportation (Medical): No   Lack of Transportation (Non-Medical): No  Physical Activity: Not on file  Stress: Not on file  Social Connections: Not on file  Intimate Partner Violence: Not on file    Outpatient Medications Prior to Visit  Medication Sig Dispense Refill   acetaminophen (TYLENOL) 325 MG tablet Take 2 tablets (650 mg total) by mouth every 6 (six) hours as needed for mild pain (or Fever >/= 101).     albuterol (VENTOLIN HFA) 108 (90 Base) MCG/ACT inhaler Inhale 2 puffs into the lungs every 6 (six) hours as needed for wheezing or shortness of breath.     aspirin EC 325 MG EC tablet Take 1 tablet (325 mg total) by mouth daily. 30 tablet 0   carbidopa-levodopa (SINEMET IR) 25-100 MG tablet Take 1/2 tablet at 7am/11am/4pm x 1 week, Then take 1/2 tablet at 7am,  1/2 tablet at 11am, 1 tablet at 4pm, at least 30 minutes before meals x 1 week, then take 1/2 tablet at 7am, 1 tablet at 11am, 1 at 4pm, at least 30 minutes before meals, x 1 week, then 1 po at 7am/11am/4pm (Patient taking differently: Take 1 tablet by mouth 3 (three) times daily. 1 po at 7am/11am/4pm) 270 tablet 0   carvedilol (COREG) 25 MG tablet TAKE 1 TABLET BY MOUTH  TWICE DAILY WITH A MEAL 180 tablet 2   clonazePAM (KLONOPIN) 1 MG tablet One at night and one  daily prn anxiety 45 tablet 2   DULoxetine (CYMBALTA) 60 MG capsule Take 1 capsule (60 mg total) by mouth daily. 90 capsule 1   fluticasone (FLONASE) 50 MCG/ACT nasal spray Place 1 spray into both nostrils 3 times/day as needed-between meals & bedtime.     Melatonin 10 MG CAPS Take 10 mg by mouth at bedtime.     montelukast (SINGULAIR) 10 MG tablet Take 10 mg by mouth at bedtime.     primidone (MYSOLINE) 50 MG tablet Take 1 tablet (50 mg total) by  mouth at bedtime. 90 tablet 1   spironolactone (ALDACTONE) 25 MG tablet Take 0.5 tablets (12.5 mg total) by mouth daily. 90 tablet 0   tamsulosin (FLOMAX) 0.4 MG CAPS capsule Take 0.4 mg by mouth 2 (two) times daily after a meal.     Vitamin D, Ergocalciferol, (DRISDOL) 1.25 MG (50000 UT) CAPS capsule Take 50,000 Units by mouth every Monday.     Zinc 50 MG TABS Take 50 mg by mouth at bedtime.     No facility-administered medications prior to visit.    No Known Allergies  Review of Systems  Musculoskeletal:  Positive for arthralgias (left hip), gait problem and myalgias (left leg).  Neurological:  Positive for tremors and weakness.  All other systems reviewed and are negative.     Objective:    Physical Exam Vitals reviewed.  Constitutional:      Appearance: He is ill-appearing (facial grimacing).  Cardiovascular:     Rate and Rhythm: Normal rate and regular rhythm.     Pulses: Normal pulses.     Heart sounds: Normal heart sounds.  Pulmonary:     Effort: Pulmonary effort is normal.     Breath sounds: Normal breath sounds.  Abdominal:     General: Bowel sounds are normal.     Palpations: Abdomen is soft.  Musculoskeletal:        General: Tenderness (left hip) present.  Skin:    General: Skin is warm and dry.     Capillary Refill: Capillary refill takes less than 2 seconds.     Findings: Bruising (left forehead) present.  Neurological:     General: No focal deficit present.     Mental Status: He is alert and oriented to person,  place, and time.  Psychiatric:        Mood and Affect: Mood normal.        Behavior: Behavior normal.    BP (!) 146/96 (BP Location: Left Arm, Patient Position: Sitting)   Pulse 71   Temp (!) 97.4 F (36.3 C) (Temporal)   Ht 6' 2"  (1.88 m)   Wt 185 lb (83.9 kg)   SpO2 96%   BMI 23.75 kg/m  Wt Readings from Last 3 Encounters:  05/15/21 185 lb (83.9 kg)  04/23/21 185 lb (83.9 kg)  04/13/21 187 lb (84.8 kg)    Health Maintenance Due  Topic Date Due   Hepatitis C Screening  Never done   TETANUS/TDAP  Never done   COLONOSCOPY (Pts 45-61yr Insurance coverage will need to be confirmed)  Never done   PNA vac Low Risk Adult (1 of 2 - PCV13) Never done   COVID-19 Vaccine (3 - Pfizer risk series) 12/28/2019   INFLUENZA VACCINE  04/02/2021     Lab Results  Component Value Date   TSH 1.240 04/19/2021   Lab Results  Component Value Date   WBC 6.1 04/19/2021   HGB 13.7 04/19/2021   HCT 40.1 04/19/2021   MCV 98 (H) 04/19/2021   PLT 193 04/19/2021   Lab Results  Component Value Date   NA 139 04/19/2021   K 4.3 04/19/2021   CO2 21 04/19/2021   GLUCOSE 98 04/19/2021   BUN 14 04/19/2021   CREATININE 0.75 (L) 04/19/2021   BILITOT 0.6 04/19/2021   ALKPHOS 77 04/19/2021   AST 11 04/19/2021   ALT 3 04/19/2021   PROT 6.7 04/19/2021   ALBUMIN 4.3 04/19/2021   CALCIUM 9.8 04/19/2021   ANIONGAP 11 01/22/2021   EGFR 96 04/19/2021  Lab Results  Component Value Date   CHOL 149 04/19/2021   Lab Results  Component Value Date   HDL 52 04/19/2021   Lab Results  Component Value Date   LDLCALC 84 04/19/2021   Lab Results  Component Value Date   TRIG 66 04/19/2021   Lab Results  Component Value Date   CHOLHDL 2.9 04/19/2021     Assessment & Plan:   1. Fall, initial encounter - Ambulatory referral to Orthopedic Surgery  2. Acute hip pain, left - Ambulatory referral to Orthopedic Surgery  3. Parkinson's disease ([place]) -fall precautions -continue  medications   Go to Dr Towanda Octave office today at 11 am to see Aaron Edelman, [place] Address: [place] for pain as directed   Follow-up as needed  I,Lauren [employer] as a Education administrator for [employer], NP.,have documented all relevant documentation on the behalf of Rip Harbour, NP,as directed by  Rip Harbour, NP while in the presence of Rip Harbour, NP.   I, Rip Harbour, NP, have reviewed all documentation for this visit. The documentation on 05/15/21 for the exam, diagnosis, procedures, and orders are all accurate and complete.    Signed, Jerrell Belfast, DNP 05/15/21 at 8:22 p.m.

## 2021-05-15 NOTE — Patient Instructions (Signed)
Go to Dr Towanda Octave office today at 11 am to see Travis Reese, [place] Address: [place] Continue Tylenol for pain as directed Follow-up as needed  Hip Pain The hip is the joint between the upper legs and the lower pelvis. The bones, cartilage, tendons, and muscles of your hip joint support your body and allow you to move around. Hip pain can range from a minor ache to severe pain in one or both of your hips. The pain may be felt on the inside of the hip joint near the groin, or on the outside near the buttocks and upper thigh. You may also have swelling or stiffness in your hip area. Follow these instructions at home: Managing pain, stiffness, and swelling   If directed, put ice on the painful area. To do this: Put ice in a plastic bag. Place a towel between your skin and the bag. Leave the ice on for 20 minutes, 2-3 times a day. If directed, apply heat to the affected area as often as told by your health care provider. Use the heat source that your health care provider recommends, such as a moist heat pack or a heating pad. Place a towel between your skin and the heat source. Leave the heat on for 20-30 minutes. Remove the heat if your skin turns bright red. This is especially important if you are unable to feel pain, heat, or cold. You may have a greater risk of getting burned. Activity Do exercises as told by your health care provider. Avoid activities that cause pain. General instructions  Take over-the-counter and prescription medicines only as told by your health care provider. Keep a journal of your symptoms. Write down: How often you have hip pain. The location of your pain. What the pain feels like. What makes the pain worse. Sleep with a pillow between your legs on your most comfortable side. Keep all follow-up visits as told by your health care provider. This is important. Contact a health care provider if: You cannot put weight on your leg. Your pain or  swelling continues or gets worse after one week. It gets harder to walk. You have a fever. Get help right away if: You fall. You have a sudden increase in pain and swelling in your hip. Your hip is red or swollen or very tender to touch. Summary Hip pain can range from a minor ache to severe pain in one or both of your hips. The pain may be felt on the inside of the hip joint near the groin, or on the outside near the buttocks and upper thigh. Avoid activities that cause pain. Write down how often you have hip pain, the location of the pain, what makes it worse, and what it feels like. This information is not intended to replace advice given to you by your health care provider. Make sure you discuss any questions you have with your health care provider. Document Revised: 01/04/2019 Document Reviewed: 01/04/2019 Elsevier Patient Education  [place] Prevention in the Home, Adult Falls can cause injuries and can happen to people of all ages. There are many things you can do to make your home safe and to help prevent falls. Ask for help when making these changes. What actions can I take to prevent falls? General Instructions Use good lighting in all rooms. Replace any light bulbs that burn out. Turn on the lights in dark areas. Use night-lights. Keep items that you use often in easy-to-reach places. Lower the  shelves around your home if needed. Set up your furniture so you have a clear path. Avoid moving your furniture around. Do not have throw rugs or other things on the floor that can make you trip. Avoid walking on wet floors. If any of your floors are uneven, fix them. Add color or contrast paint or tape to clearly mark and help you see: Grab bars or handrails. First and last steps of staircases. Where the edge of each step is. If you use a stepladder: Make sure that it is fully opened. Do not climb a closed stepladder. Make sure the sides of the stepladder are locked  in place. Ask someone to hold the stepladder while you use it. Know where your pets are when moving through your home. What can I do in the bathroom?   Keep the floor dry. Clean up any water on the floor right away. Remove soap buildup in the tub or shower. Use nonskid mats or decals on the floor of the tub or shower. Attach bath mats securely with double-sided, nonslip rug tape. If you need to sit down in the shower, use a plastic, nonslip stool. Install grab bars by the toilet and in the tub and shower. Do not use towel bars as grab bars. What can I do in the bedroom? Make sure that you have a light by your bed that is easy to reach. Do not use any sheets or blankets for your bed that hang to the floor. Have a firm chair with side arms that you can use for support when you get dressed. What can I do in the kitchen? Clean up any spills right away. If you need to reach something above you, use a step stool with a grab bar. Keep electrical cords out of the way. Do not use floor polish or wax that makes floors slippery. What can I do with my stairs? Do not leave any items on the stairs. Make sure that you have a light switch at the top and the bottom of the stairs. Make sure that there are handrails on both sides of the stairs. Fix handrails that are broken or loose. Install nonslip stair treads on all your stairs. Avoid having throw rugs at the top or bottom of the stairs. Choose a carpet that does not hide the edge of the steps on the stairs. Check carpeting to make sure that it is firmly attached to the stairs. Fix carpet that is loose or worn. What can I do on the outside of my home? Use bright outdoor lighting. Fix the edges of walkways and driveways and fix any cracks. Remove anything that might make you trip as you walk through a door, such as a raised step or threshold. Trim any bushes or trees on paths to your home. Check to see if handrails are loose or broken and that both  sides of all steps have handrails. Install guardrails along the edges of any raised decks and porches. Clear paths of anything that can make you trip, such as tools or rocks. Have leaves, snow, or ice cleared regularly. Use sand or salt on paths during winter. Clean up any spills in your garage right away. This includes grease or oil spills. What other actions can I take? Wear shoes that: Have a low heel. Do not wear high heels. Have rubber bottoms. Feel good on your feet and fit well. Are closed at the toe. Do not wear open-toe sandals. Use tools that help you move around  if needed. These include: Canes. Walkers. Scooters. Crutches. Review your medicines with your doctor. Some medicines can make you feel dizzy. This can increase your chance of falling. Ask your doctor what else you can do to help prevent falls. Where to find more information Centers for Disease Control and Prevention, [place]: http://www.wolf.info/ National Institute on Aging: http://kim-miller.com/ Contact a doctor if: You are afraid of falling at home. You feel weak, drowsy, or dizzy at home. You fall at home. Summary There are many simple things that you can do to make your home safe and to help prevent falls. Ways to make your home safe include removing things that can make you trip and installing grab bars in the bathroom. Ask for help when making these changes in your home. This information is not intended to replace advice given to you by your health care provider. Make sure you discuss any questions you have with your health care provider. Document Revised: 03/22/2020 Document Reviewed: 03/22/2020 Elsevier Patient Education  [place].

## 2021-05-19 DIAGNOSIS — S72002D Fracture of unspecified part of neck of left femur, subsequent encounter for closed fracture with routine healing: Secondary | ICD-10-CM | POA: Diagnosis not present

## 2021-05-19 DIAGNOSIS — F32A Depression, unspecified: Secondary | ICD-10-CM | POA: Diagnosis not present

## 2021-05-19 DIAGNOSIS — K5900 Constipation, unspecified: Secondary | ICD-10-CM | POA: Diagnosis not present

## 2021-05-19 DIAGNOSIS — S7292XA Unspecified fracture of left femur, initial encounter for closed fracture: Secondary | ICD-10-CM | POA: Diagnosis not present

## 2021-05-19 DIAGNOSIS — I10 Essential (primary) hypertension: Secondary | ICD-10-CM | POA: Diagnosis not present

## 2021-05-19 DIAGNOSIS — L89153 Pressure ulcer of sacral region, stage 3: Secondary | ICD-10-CM | POA: Diagnosis not present

## 2021-05-19 DIAGNOSIS — M25552 Pain in left hip: Secondary | ICD-10-CM | POA: Diagnosis not present

## 2021-05-19 DIAGNOSIS — S72002A Fracture of unspecified part of neck of left femur, initial encounter for closed fracture: Secondary | ICD-10-CM | POA: Diagnosis not present

## 2021-05-19 DIAGNOSIS — D649 Anemia, unspecified: Secondary | ICD-10-CM | POA: Diagnosis not present

## 2021-05-19 DIAGNOSIS — D62 Acute posthemorrhagic anemia: Secondary | ICD-10-CM | POA: Diagnosis not present

## 2021-05-19 DIAGNOSIS — W19XXXA Unspecified fall, initial encounter: Secondary | ICD-10-CM | POA: Diagnosis not present

## 2021-05-19 DIAGNOSIS — Z4789 Encounter for other orthopedic aftercare: Secondary | ICD-10-CM | POA: Diagnosis not present

## 2021-05-19 DIAGNOSIS — I509 Heart failure, unspecified: Secondary | ICD-10-CM | POA: Diagnosis not present

## 2021-05-19 DIAGNOSIS — R0602 Shortness of breath: Secondary | ICD-10-CM | POA: Diagnosis not present

## 2021-05-19 DIAGNOSIS — W19XXXD Unspecified fall, subsequent encounter: Secondary | ICD-10-CM | POA: Diagnosis not present

## 2021-05-19 DIAGNOSIS — J45909 Unspecified asthma, uncomplicated: Secondary | ICD-10-CM | POA: Diagnosis not present

## 2021-05-19 DIAGNOSIS — G20 Parkinson's disease: Secondary | ICD-10-CM | POA: Diagnosis not present

## 2021-05-19 DIAGNOSIS — M79652 Pain in left thigh: Secondary | ICD-10-CM | POA: Diagnosis not present

## 2021-05-19 DIAGNOSIS — K219 Gastro-esophageal reflux disease without esophagitis: Secondary | ICD-10-CM | POA: Diagnosis not present

## 2021-05-19 DIAGNOSIS — Z96642 Presence of left artificial hip joint: Secondary | ICD-10-CM | POA: Diagnosis not present

## 2021-05-20 DIAGNOSIS — I509 Heart failure, unspecified: Secondary | ICD-10-CM | POA: Diagnosis not present

## 2021-05-20 DIAGNOSIS — S7292XA Unspecified fracture of left femur, initial encounter for closed fracture: Secondary | ICD-10-CM | POA: Diagnosis not present

## 2021-05-20 DIAGNOSIS — D649 Anemia, unspecified: Secondary | ICD-10-CM | POA: Diagnosis not present

## 2021-05-20 DIAGNOSIS — G20 Parkinson's disease: Secondary | ICD-10-CM | POA: Diagnosis not present

## 2021-05-21 DIAGNOSIS — M25552 Pain in left hip: Secondary | ICD-10-CM | POA: Diagnosis not present

## 2021-05-21 DIAGNOSIS — M79652 Pain in left thigh: Secondary | ICD-10-CM | POA: Diagnosis not present

## 2021-05-25 ENCOUNTER — Ambulatory Visit: Payer: Medicare Other | Admitting: Cardiology

## 2021-05-29 DIAGNOSIS — S72002A Fracture of unspecified part of neck of left femur, initial encounter for closed fracture: Secondary | ICD-10-CM | POA: Diagnosis not present

## 2021-05-30 DIAGNOSIS — L89153 Pressure ulcer of sacral region, stage 3: Secondary | ICD-10-CM | POA: Diagnosis not present

## 2021-06-08 DIAGNOSIS — E538 Deficiency of other specified B group vitamins: Secondary | ICD-10-CM | POA: Diagnosis not present

## 2021-06-08 DIAGNOSIS — I5030 Unspecified diastolic (congestive) heart failure: Secondary | ICD-10-CM | POA: Diagnosis not present

## 2021-06-08 DIAGNOSIS — Z7982 Long term (current) use of aspirin: Secondary | ICD-10-CM | POA: Diagnosis not present

## 2021-06-08 DIAGNOSIS — Z952 Presence of prosthetic heart valve: Secondary | ICD-10-CM | POA: Diagnosis not present

## 2021-06-08 DIAGNOSIS — M4802 Spinal stenosis, cervical region: Secondary | ICD-10-CM | POA: Diagnosis not present

## 2021-06-08 DIAGNOSIS — G959 Disease of spinal cord, unspecified: Secondary | ICD-10-CM | POA: Diagnosis not present

## 2021-06-08 DIAGNOSIS — R1311 Dysphagia, oral phase: Secondary | ICD-10-CM | POA: Diagnosis not present

## 2021-06-08 DIAGNOSIS — Z9181 History of falling: Secondary | ICD-10-CM | POA: Diagnosis not present

## 2021-06-08 DIAGNOSIS — D692 Other nonthrombocytopenic purpura: Secondary | ICD-10-CM | POA: Diagnosis not present

## 2021-06-08 DIAGNOSIS — Z7951 Long term (current) use of inhaled steroids: Secondary | ICD-10-CM | POA: Diagnosis not present

## 2021-06-08 DIAGNOSIS — I2510 Atherosclerotic heart disease of native coronary artery without angina pectoris: Secondary | ICD-10-CM | POA: Diagnosis not present

## 2021-06-08 DIAGNOSIS — G20 Parkinson's disease: Secondary | ICD-10-CM | POA: Diagnosis not present

## 2021-06-08 DIAGNOSIS — I110 Hypertensive heart disease with heart failure: Secondary | ICD-10-CM | POA: Diagnosis not present

## 2021-06-08 DIAGNOSIS — D62 Acute posthemorrhagic anemia: Secondary | ICD-10-CM | POA: Diagnosis not present

## 2021-06-08 DIAGNOSIS — D892 Hypergammaglobulinemia, unspecified: Secondary | ICD-10-CM | POA: Diagnosis not present

## 2021-06-08 DIAGNOSIS — M4805 Spinal stenosis, thoracolumbar region: Secondary | ICD-10-CM | POA: Diagnosis not present

## 2021-06-08 DIAGNOSIS — G8929 Other chronic pain: Secondary | ICD-10-CM | POA: Diagnosis not present

## 2021-06-08 DIAGNOSIS — J449 Chronic obstructive pulmonary disease, unspecified: Secondary | ICD-10-CM | POA: Diagnosis not present

## 2021-06-08 DIAGNOSIS — E559 Vitamin D deficiency, unspecified: Secondary | ICD-10-CM | POA: Diagnosis not present

## 2021-06-08 DIAGNOSIS — K5900 Constipation, unspecified: Secondary | ICD-10-CM | POA: Diagnosis not present

## 2021-06-08 DIAGNOSIS — S72042D Displaced fracture of base of neck of left femur, subsequent encounter for closed fracture with routine healing: Secondary | ICD-10-CM | POA: Diagnosis not present

## 2021-06-10 ENCOUNTER — Other Ambulatory Visit: Payer: Self-pay | Admitting: Neurology

## 2021-06-10 DIAGNOSIS — G20 Parkinson's disease: Secondary | ICD-10-CM

## 2021-06-11 ENCOUNTER — Telehealth: Payer: Self-pay

## 2021-06-11 ENCOUNTER — Other Ambulatory Visit: Payer: Self-pay

## 2021-06-11 NOTE — Telephone Encounter (Signed)
Physical therapist called and requested for verbal orders for PT 2 times per week for 3 weeks and then 1 time per week for 5 weeks. I gave him verbal orders.

## 2021-06-13 DIAGNOSIS — I2510 Atherosclerotic heart disease of native coronary artery without angina pectoris: Secondary | ICD-10-CM | POA: Diagnosis not present

## 2021-06-13 DIAGNOSIS — D892 Hypergammaglobulinemia, unspecified: Secondary | ICD-10-CM | POA: Diagnosis not present

## 2021-06-13 DIAGNOSIS — D692 Other nonthrombocytopenic purpura: Secondary | ICD-10-CM | POA: Diagnosis not present

## 2021-06-13 DIAGNOSIS — S72042D Displaced fracture of base of neck of left femur, subsequent encounter for closed fracture with routine healing: Secondary | ICD-10-CM | POA: Diagnosis not present

## 2021-06-13 DIAGNOSIS — R1311 Dysphagia, oral phase: Secondary | ICD-10-CM | POA: Diagnosis not present

## 2021-06-13 DIAGNOSIS — Z952 Presence of prosthetic heart valve: Secondary | ICD-10-CM | POA: Diagnosis not present

## 2021-06-13 DIAGNOSIS — E538 Deficiency of other specified B group vitamins: Secondary | ICD-10-CM | POA: Diagnosis not present

## 2021-06-13 DIAGNOSIS — E559 Vitamin D deficiency, unspecified: Secondary | ICD-10-CM | POA: Diagnosis not present

## 2021-06-13 DIAGNOSIS — J449 Chronic obstructive pulmonary disease, unspecified: Secondary | ICD-10-CM | POA: Diagnosis not present

## 2021-06-13 DIAGNOSIS — Z7982 Long term (current) use of aspirin: Secondary | ICD-10-CM | POA: Diagnosis not present

## 2021-06-13 DIAGNOSIS — K5900 Constipation, unspecified: Secondary | ICD-10-CM | POA: Diagnosis not present

## 2021-06-13 DIAGNOSIS — G959 Disease of spinal cord, unspecified: Secondary | ICD-10-CM | POA: Diagnosis not present

## 2021-06-13 DIAGNOSIS — M4802 Spinal stenosis, cervical region: Secondary | ICD-10-CM | POA: Diagnosis not present

## 2021-06-13 DIAGNOSIS — Z9181 History of falling: Secondary | ICD-10-CM | POA: Diagnosis not present

## 2021-06-13 DIAGNOSIS — G20 Parkinson's disease: Secondary | ICD-10-CM | POA: Diagnosis not present

## 2021-06-13 DIAGNOSIS — M4805 Spinal stenosis, thoracolumbar region: Secondary | ICD-10-CM | POA: Diagnosis not present

## 2021-06-13 DIAGNOSIS — I5030 Unspecified diastolic (congestive) heart failure: Secondary | ICD-10-CM | POA: Diagnosis not present

## 2021-06-13 DIAGNOSIS — Z7951 Long term (current) use of inhaled steroids: Secondary | ICD-10-CM | POA: Diagnosis not present

## 2021-06-13 DIAGNOSIS — D62 Acute posthemorrhagic anemia: Secondary | ICD-10-CM | POA: Diagnosis not present

## 2021-06-13 DIAGNOSIS — G8929 Other chronic pain: Secondary | ICD-10-CM | POA: Diagnosis not present

## 2021-06-13 DIAGNOSIS — I110 Hypertensive heart disease with heart failure: Secondary | ICD-10-CM | POA: Diagnosis not present

## 2021-06-15 DIAGNOSIS — I110 Hypertensive heart disease with heart failure: Secondary | ICD-10-CM | POA: Diagnosis not present

## 2021-06-15 DIAGNOSIS — D892 Hypergammaglobulinemia, unspecified: Secondary | ICD-10-CM | POA: Diagnosis not present

## 2021-06-15 DIAGNOSIS — I5030 Unspecified diastolic (congestive) heart failure: Secondary | ICD-10-CM | POA: Diagnosis not present

## 2021-06-15 DIAGNOSIS — Z7982 Long term (current) use of aspirin: Secondary | ICD-10-CM | POA: Diagnosis not present

## 2021-06-15 DIAGNOSIS — Z952 Presence of prosthetic heart valve: Secondary | ICD-10-CM | POA: Diagnosis not present

## 2021-06-15 DIAGNOSIS — M4802 Spinal stenosis, cervical region: Secondary | ICD-10-CM | POA: Diagnosis not present

## 2021-06-15 DIAGNOSIS — E559 Vitamin D deficiency, unspecified: Secondary | ICD-10-CM | POA: Diagnosis not present

## 2021-06-15 DIAGNOSIS — S72042D Displaced fracture of base of neck of left femur, subsequent encounter for closed fracture with routine healing: Secondary | ICD-10-CM | POA: Diagnosis not present

## 2021-06-15 DIAGNOSIS — Z9181 History of falling: Secondary | ICD-10-CM | POA: Diagnosis not present

## 2021-06-15 DIAGNOSIS — G8929 Other chronic pain: Secondary | ICD-10-CM | POA: Diagnosis not present

## 2021-06-15 DIAGNOSIS — G959 Disease of spinal cord, unspecified: Secondary | ICD-10-CM | POA: Diagnosis not present

## 2021-06-15 DIAGNOSIS — G20 Parkinson's disease: Secondary | ICD-10-CM | POA: Diagnosis not present

## 2021-06-15 DIAGNOSIS — D692 Other nonthrombocytopenic purpura: Secondary | ICD-10-CM | POA: Diagnosis not present

## 2021-06-15 DIAGNOSIS — I2510 Atherosclerotic heart disease of native coronary artery without angina pectoris: Secondary | ICD-10-CM | POA: Diagnosis not present

## 2021-06-15 DIAGNOSIS — M4805 Spinal stenosis, thoracolumbar region: Secondary | ICD-10-CM | POA: Diagnosis not present

## 2021-06-15 DIAGNOSIS — E538 Deficiency of other specified B group vitamins: Secondary | ICD-10-CM | POA: Diagnosis not present

## 2021-06-15 DIAGNOSIS — R1311 Dysphagia, oral phase: Secondary | ICD-10-CM | POA: Diagnosis not present

## 2021-06-15 DIAGNOSIS — K5900 Constipation, unspecified: Secondary | ICD-10-CM | POA: Diagnosis not present

## 2021-06-15 DIAGNOSIS — J449 Chronic obstructive pulmonary disease, unspecified: Secondary | ICD-10-CM | POA: Diagnosis not present

## 2021-06-15 DIAGNOSIS — Z7951 Long term (current) use of inhaled steroids: Secondary | ICD-10-CM | POA: Diagnosis not present

## 2021-06-15 DIAGNOSIS — D62 Acute posthemorrhagic anemia: Secondary | ICD-10-CM | POA: Diagnosis not present

## 2021-06-18 DIAGNOSIS — Z9181 History of falling: Secondary | ICD-10-CM | POA: Diagnosis not present

## 2021-06-18 DIAGNOSIS — G20 Parkinson's disease: Secondary | ICD-10-CM | POA: Diagnosis not present

## 2021-06-18 DIAGNOSIS — D892 Hypergammaglobulinemia, unspecified: Secondary | ICD-10-CM | POA: Diagnosis not present

## 2021-06-18 DIAGNOSIS — J449 Chronic obstructive pulmonary disease, unspecified: Secondary | ICD-10-CM | POA: Diagnosis not present

## 2021-06-18 DIAGNOSIS — E538 Deficiency of other specified B group vitamins: Secondary | ICD-10-CM | POA: Diagnosis not present

## 2021-06-18 DIAGNOSIS — S72042D Displaced fracture of base of neck of left femur, subsequent encounter for closed fracture with routine healing: Secondary | ICD-10-CM | POA: Diagnosis not present

## 2021-06-18 DIAGNOSIS — Z952 Presence of prosthetic heart valve: Secondary | ICD-10-CM | POA: Diagnosis not present

## 2021-06-18 DIAGNOSIS — R1311 Dysphagia, oral phase: Secondary | ICD-10-CM | POA: Diagnosis not present

## 2021-06-18 DIAGNOSIS — D62 Acute posthemorrhagic anemia: Secondary | ICD-10-CM | POA: Diagnosis not present

## 2021-06-18 DIAGNOSIS — G959 Disease of spinal cord, unspecified: Secondary | ICD-10-CM | POA: Diagnosis not present

## 2021-06-18 DIAGNOSIS — I5030 Unspecified diastolic (congestive) heart failure: Secondary | ICD-10-CM | POA: Diagnosis not present

## 2021-06-18 DIAGNOSIS — M4805 Spinal stenosis, thoracolumbar region: Secondary | ICD-10-CM | POA: Diagnosis not present

## 2021-06-18 DIAGNOSIS — Z7982 Long term (current) use of aspirin: Secondary | ICD-10-CM | POA: Diagnosis not present

## 2021-06-18 DIAGNOSIS — M4802 Spinal stenosis, cervical region: Secondary | ICD-10-CM | POA: Diagnosis not present

## 2021-06-18 DIAGNOSIS — E559 Vitamin D deficiency, unspecified: Secondary | ICD-10-CM | POA: Diagnosis not present

## 2021-06-18 DIAGNOSIS — K5900 Constipation, unspecified: Secondary | ICD-10-CM | POA: Diagnosis not present

## 2021-06-18 DIAGNOSIS — D692 Other nonthrombocytopenic purpura: Secondary | ICD-10-CM | POA: Diagnosis not present

## 2021-06-18 DIAGNOSIS — Z7951 Long term (current) use of inhaled steroids: Secondary | ICD-10-CM | POA: Diagnosis not present

## 2021-06-18 DIAGNOSIS — I110 Hypertensive heart disease with heart failure: Secondary | ICD-10-CM | POA: Diagnosis not present

## 2021-06-18 DIAGNOSIS — G8929 Other chronic pain: Secondary | ICD-10-CM | POA: Diagnosis not present

## 2021-06-18 DIAGNOSIS — I2510 Atherosclerotic heart disease of native coronary artery without angina pectoris: Secondary | ICD-10-CM | POA: Diagnosis not present

## 2021-06-21 ENCOUNTER — Ambulatory Visit (INDEPENDENT_AMBULATORY_CARE_PROVIDER_SITE_OTHER): Payer: Medicare Other | Admitting: Family Medicine

## 2021-06-21 ENCOUNTER — Encounter: Payer: Self-pay | Admitting: Family Medicine

## 2021-06-21 ENCOUNTER — Other Ambulatory Visit: Payer: Self-pay

## 2021-06-21 ENCOUNTER — Ambulatory Visit (INDEPENDENT_AMBULATORY_CARE_PROVIDER_SITE_OTHER): Payer: Medicare Other

## 2021-06-21 VITALS — BP 110/50 | HR 60 | Temp 96.3°F | Resp 16 | Ht 74.0 in | Wt 183.6 lb

## 2021-06-21 DIAGNOSIS — I110 Hypertensive heart disease with heart failure: Secondary | ICD-10-CM | POA: Diagnosis not present

## 2021-06-21 DIAGNOSIS — Z23 Encounter for immunization: Secondary | ICD-10-CM | POA: Diagnosis not present

## 2021-06-21 DIAGNOSIS — D649 Anemia, unspecified: Secondary | ICD-10-CM | POA: Diagnosis not present

## 2021-06-21 DIAGNOSIS — Z8781 Personal history of (healed) traumatic fracture: Secondary | ICD-10-CM

## 2021-06-21 DIAGNOSIS — E782 Mixed hyperlipidemia: Secondary | ICD-10-CM

## 2021-06-21 DIAGNOSIS — G20 Parkinson's disease: Secondary | ICD-10-CM

## 2021-06-21 DIAGNOSIS — I5032 Chronic diastolic (congestive) heart failure: Secondary | ICD-10-CM

## 2021-06-21 NOTE — Progress Notes (Signed)
Subjective:  Patient ID: Travis Reese, male    DOB: [birth year]  Age: 75 y.o. MRN: [id number]  Chief Complaint  Patient presents with   Hip Injury    HPI Patient fell getting out of bed. Misjudged the edge of the bed in [facility] 05/26/2023. Taken to [facility]. Had hip fracture and subsequent surgery done by Dr. Donivan Scull. Rehab for 2 weeks at [facility]. Fell while at the nursing home when went to the bathroom and forgot to take his walker. Rexrayed and luckily no more fractures and was healing well. Improved and now at home receiving home physical therapy.  Dr. Donivan Scull also did repeat postop xrays and was healing well. Using walker at home.   Pt is coping with the loss of his son, who was in an accident and died in 26-May-2023. His son ran off the road.   Current Outpatient Medications on File Prior to Visit  Medication Sig Dispense Refill   Cyanocobalamin 1000 MCG SUBL Place 1,000 mcg under the tongue daily.     acetaminophen (TYLENOL) 325 MG tablet Take 2 tablets (650 mg total) by mouth every 6 (six) hours as needed for mild pain (or Fever >/= 101).     albuterol (VENTOLIN HFA) 108 (90 Base) MCG/ACT inhaler Inhale 2 puffs into the lungs every 6 (six) hours as needed for wheezing or shortness of breath.     aspirin EC 325 MG EC tablet Take 1 tablet (325 mg total) by mouth daily. 30 tablet 0   carbidopa-levodopa (SINEMET IR) 25-100 MG tablet Take 1 tablet by mouth 3 (three) times daily. 1 po at 7am/11am/4pm 270 tablet 1   carvedilol (COREG) 25 MG tablet TAKE 1 TABLET BY MOUTH  TWICE DAILY WITH A MEAL 180 tablet 2   clonazePAM (KLONOPIN) 1 MG tablet One at night and one daily prn anxiety 45 tablet 2   DULoxetine (CYMBALTA) 60 MG capsule Take 1 capsule (60 mg total) by mouth daily. 90 capsule 1   fluticasone (FLONASE) 50 MCG/ACT nasal spray Place 1 spray into both nostrils 3 times/day as needed-between meals & bedtime.     Melatonin 10 MG CAPS Take 5 mg by mouth at bedtime.     montelukast  (SINGULAIR) 10 MG tablet Take 10 mg by mouth at bedtime.     primidone (MYSOLINE) 50 MG tablet Take 1 tablet (50 mg total) by mouth at bedtime. 90 tablet 1   spironolactone (ALDACTONE) 25 MG tablet Take 0.5 tablets (12.5 mg total) by mouth daily. 90 tablet 0   tamsulosin (FLOMAX) 0.4 MG CAPS capsule Take 0.4 mg by mouth 2 (two) times daily after a meal.     Vitamin D, Ergocalciferol, (DRISDOL) 1.25 MG (50000 UT) CAPS capsule Take 50,000 Units by mouth every Monday.     Zinc 50 MG TABS Take 50 mg by mouth at bedtime.     No current facility-administered medications on file prior to visit.   Past Medical History:  Diagnosis Date   Allergic rhinosinusitis    Anxiety    Aortic valve sclerosis    Arthritis    knees, neck, shoulder   CHF (congestive heart failure) (HCC)    Cubital tunnel syndrome    DDD (degenerative disc disease), cervical    Depression    Elevated PSA    denies per patient   HTN (hypertension)    Hyperlipidemia    Hyperplasia of prostate with lower urinary tract symptoms (LUTS)    Hypertension    Mild dilation  of ascending aorta (HCC)    Parkinson disease ([place])    Prolapsed internal hemorrhoids, grade 3 04/19/2015   Rectal prolapse    Scoliosis of lumbosacral spine    Stroke [facility])    x2   Tremor    Vitamin D deficiency    Wears hearing aid    bilateral   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   BENTALL PROCEDURE N/A 05/01/2018   Procedure: BIOLOGICAL BENTALL PROCEDURE AORTIC ROOT REPLACEMENT WITH 30MM VALSALVA GRAFT & 27MM INSPIRIS VALVE.;  Surgeon: Rexene Alberts, MD;  Location: [place];  Service: Open Heart Surgery;  Laterality: N/A;   BUBBLE STUDY  04/13/2021   Procedure: BUBBLE STUDY;  Surgeon: Buford Dresser, MD;  Location[facility] ENDOSCOPY;  Service: Cardiovascular;;   CATARACT EXTRACTION Bilateral 04/2018   COLONOSCOPY  2007   NASAL SINUS SURGERY  2006   RIGHT/LEFT HEART CATH AND CORONARY  ANGIOGRAPHY N/A 04/27/2018   Procedure: RIGHT/LEFT HEART CATH AND CORONARY ANGIOGRAPHY;  Surgeon: Jolaine Artist, MD;  Location: [place] CV LAB;  Service: Cardiovascular;  Laterality: N/A;   TEE WITHOUT CARDIOVERSION N/A 04/27/2018   Procedure: TRANSESOPHAGEAL ECHOCARDIOGRAM (TEE);  Surgeon: Jolaine Artist, MD;  Location: [facility] ENDOSCOPY;  Service: Cardiovascular;  Laterality: N/A;   TEE WITHOUT CARDIOVERSION N/A 05/01/2018   Procedure: TRANSESOPHAGEAL ECHOCARDIOGRAM (TEE);  Surgeon: Rexene Alberts, MD;  Location: [place];  Service: Open Heart Surgery;  Laterality: N/A;   TEE WITHOUT CARDIOVERSION N/A 04/13/2021   Procedure: TRANSESOPHAGEAL ECHOCARDIOGRAM (TEE);  Surgeon: Buford Dresser, MD;  Location: [facility] ENDOSCOPY;  Service: Cardiovascular;  Laterality: N/A;   TRANSANAL HEMORRHOIDAL DEARTERIALIZATION N/A 11/15/2015   Procedure: TRANSANAL HEMORRHOIDAL DEARTERIALIZATION;  Surgeon: Leighton Ruff, MD;  Location: [place];  Service: General;  Laterality: N/A;    Family History  Problem Relation Age of Onset   COPD Mother    Heart attack Mother    Colon polyps Father    Prostate cancer Father    Parkinson's disease Father    Hyperlipidemia Father    Hypertension Sister    Hypertension Brother    Breast cancer Sister    Prostate cancer Paternal Uncle    Social History   Socioeconomic History   Marital status: Single    Spouse name: Not on file   Number of children: 2   Years of education: Not on file   Highest education level: Not on file  Occupational History   Occupation: retired    Comment: security guard  Tobacco Use   Smoking status: Some Days    Packs/day: 0.10    Years: 50.00    Pack years: 5.00    Types: Cigarettes    Last attempt to quit: 05/2018    Years since quitting: 3.1   Smokeless tobacco: Never  Vaping Use   Vaping Use: Former  Substance and Sexual Activity   Alcohol use: Not Currently    Comment: Occasional   Drug use: No    Sexual activity: Not Currently  Other Topics Concern   Not on file  Social History Narrative   Divorced lives with father after incarceration x 5 yrs   1 son, 1 daughter   Volunteer work   3 caffeine/day   04/19/2015   Right handed   Social Determinants of Health   Financial Resource Strain: Not on file  Food Insecurity: No Food Insecurity   Worried About [place] in the Last Year: Never  true   Ran Out of Food in the Last Year: Never true  Transportation Needs: No Transportation Needs   Lack of Transportation (Medical): No   Lack of Transportation (Non-Medical): No  Physical Activity: Not on file  Stress: Not on file  Social Connections: Not on file    Review of Systems  Constitutional:  Negative for chills and fever.  HENT:  Positive for rhinorrhea. Negative for congestion and sore throat.   Respiratory:  Negative for cough and shortness of breath.   Cardiovascular:  Negative for chest pain and palpitations.  Gastrointestinal:  Negative for abdominal pain, constipation, diarrhea, nausea and vomiting.  Genitourinary:  Negative for dysuria and urgency.  Musculoskeletal:  Positive for arthralgias, back pain and myalgias.  Neurological:  Positive for weakness. Negative for dizziness and headaches.  Psychiatric/Behavioral:  Negative for dysphoric mood. The patient is not nervous/anxious.     Objective:  BP (!) 110/50   Pulse 60   Temp (!) 96.3 F (35.7 C)   Resp 16   Ht 6\' 2"  (1.88 m)   Wt 183 lb 9.6 oz (83.3 kg)   BMI 23.57 kg/m   BP/Weight 06/21/2021 05/15/2021 6/76/1948  Systolic BP 932 671 245  Diastolic BP 50 96 64  Wt. (Lbs) 183.6 185 185  BMI 23.57 23.75 23.75    Physical Exam Vitals reviewed.  Constitutional:      Appearance: Normal appearance.  Neck:     Vascular: No carotid bruit.  Cardiovascular:     Rate and Rhythm: Normal rate and regular rhythm.     Heart sounds: Normal heart sounds.  Pulmonary:     Effort: Pulmonary effort is  normal.     Breath sounds: Normal breath sounds. No wheezing, rhonchi or rales.  Abdominal:     General: Bowel sounds are normal.     Palpations: Abdomen is soft.     Tenderness: There is no abdominal tenderness.  Neurological:     Mental Status: He is alert.     Gait: Gait abnormal (using walker.).  Psychiatric:        Mood and Affect: Mood normal.        Behavior: Behavior normal.    Diabetic Foot Exam - Simple   No data filed      Lab Results  Component Value Date   WBC 6.5 06/21/2021   HGB 12.7 (L) 06/21/2021   HCT 37.5 06/21/2021   PLT 240 06/21/2021   GLUCOSE 96 06/21/2021   CHOL 149 04/19/2021   TRIG 66 04/19/2021   HDL 52 04/19/2021   LDLCALC 84 04/19/2021   ALT 2 06/21/2021   AST 12 06/21/2021   NA 138 06/21/2021   K 4.8 06/21/2021   CL 101 06/21/2021   CREATININE 0.74 (L) 06/21/2021   BUN 16 06/21/2021   CO2 24 06/21/2021   TSH 1.240 04/19/2021   INR 1.58 05/01/2018      Assessment & Plan:   Problem List Items Addressed This Visit       Cardiovascular and Mediastinum   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         Nervous and Auditory   Parkinson's disease ([place])    Stable on current medication.        Other   Mixed hyperlipidemia    The current medical regimen is effective;  continue present plan and medications.  Relevant Orders   Comprehensive metabolic panel (Completed)   Postoperative anemia - Primary    Improved hb 1 week after surgery.  Check cbc.       Relevant Medications   Cyanocobalamin 1000 MCG SUBL   Other Relevant Orders   CBC with Differential/Platelet (Completed)   S/p left hip fracture    Continue physical therapy.  Improving.      .  No orders of the defined types were placed in this encounter.   Orders Placed This Encounter  Procedures   CBC with Differential/Platelet    Comprehensive metabolic panel     Follow-up: Return in about 6 weeks (around 08/02/2021) for chronic follow up.  An After Visit Summary was printed and given to the patient.  Rochel Brome, MD [facility] [phone]

## 2021-06-22 DIAGNOSIS — Z7951 Long term (current) use of inhaled steroids: Secondary | ICD-10-CM | POA: Diagnosis not present

## 2021-06-22 DIAGNOSIS — R1311 Dysphagia, oral phase: Secondary | ICD-10-CM | POA: Diagnosis not present

## 2021-06-22 DIAGNOSIS — Z952 Presence of prosthetic heart valve: Secondary | ICD-10-CM | POA: Diagnosis not present

## 2021-06-22 DIAGNOSIS — G8929 Other chronic pain: Secondary | ICD-10-CM | POA: Diagnosis not present

## 2021-06-22 DIAGNOSIS — J449 Chronic obstructive pulmonary disease, unspecified: Secondary | ICD-10-CM | POA: Diagnosis not present

## 2021-06-22 DIAGNOSIS — D692 Other nonthrombocytopenic purpura: Secondary | ICD-10-CM | POA: Diagnosis not present

## 2021-06-22 DIAGNOSIS — I5030 Unspecified diastolic (congestive) heart failure: Secondary | ICD-10-CM | POA: Diagnosis not present

## 2021-06-22 DIAGNOSIS — G959 Disease of spinal cord, unspecified: Secondary | ICD-10-CM | POA: Diagnosis not present

## 2021-06-22 DIAGNOSIS — G20 Parkinson's disease: Secondary | ICD-10-CM | POA: Diagnosis not present

## 2021-06-22 DIAGNOSIS — S72042D Displaced fracture of base of neck of left femur, subsequent encounter for closed fracture with routine healing: Secondary | ICD-10-CM | POA: Diagnosis not present

## 2021-06-22 DIAGNOSIS — M4805 Spinal stenosis, thoracolumbar region: Secondary | ICD-10-CM | POA: Diagnosis not present

## 2021-06-22 DIAGNOSIS — Z7982 Long term (current) use of aspirin: Secondary | ICD-10-CM | POA: Diagnosis not present

## 2021-06-22 DIAGNOSIS — D62 Acute posthemorrhagic anemia: Secondary | ICD-10-CM | POA: Diagnosis not present

## 2021-06-22 DIAGNOSIS — Z9181 History of falling: Secondary | ICD-10-CM | POA: Diagnosis not present

## 2021-06-22 DIAGNOSIS — I110 Hypertensive heart disease with heart failure: Secondary | ICD-10-CM | POA: Diagnosis not present

## 2021-06-22 DIAGNOSIS — D892 Hypergammaglobulinemia, unspecified: Secondary | ICD-10-CM | POA: Diagnosis not present

## 2021-06-22 DIAGNOSIS — E538 Deficiency of other specified B group vitamins: Secondary | ICD-10-CM | POA: Diagnosis not present

## 2021-06-22 DIAGNOSIS — E559 Vitamin D deficiency, unspecified: Secondary | ICD-10-CM | POA: Diagnosis not present

## 2021-06-22 DIAGNOSIS — K5900 Constipation, unspecified: Secondary | ICD-10-CM | POA: Diagnosis not present

## 2021-06-22 DIAGNOSIS — I2510 Atherosclerotic heart disease of native coronary artery without angina pectoris: Secondary | ICD-10-CM | POA: Diagnosis not present

## 2021-06-22 DIAGNOSIS — M4802 Spinal stenosis, cervical region: Secondary | ICD-10-CM | POA: Diagnosis not present

## 2021-06-22 LAB — CBC WITH DIFFERENTIAL/PLATELET
Basophils Absolute: 0 10*3/uL (ref 0.0–0.2)
Basos: 1 %
EOS (ABSOLUTE): 0.2 10*3/uL (ref 0.0–0.4)
Eos: 3 %
Hematocrit: 37.5 % (ref 37.5–51.0)
Hemoglobin: 12.7 g/dL — ABNORMAL LOW (ref 13.0–17.7)
Immature Grans (Abs): 0 10*3/uL (ref 0.0–0.1)
Immature Granulocytes: 0 %
Lymphocytes Absolute: 1.7 10*3/uL (ref 0.7–3.1)
Lymphs: 27 %
MCH: 34.5 pg — ABNORMAL HIGH (ref 26.6–33.0)
MCHC: 33.9 g/dL (ref 31.5–35.7)
MCV: 102 fL — ABNORMAL HIGH (ref 79–97)
Monocytes Absolute: 0.6 10*3/uL (ref 0.1–0.9)
Monocytes: 9 %
Neutrophils Absolute: 4 10*3/uL (ref 1.4–7.0)
Neutrophils: 60 %
Platelets: 240 10*3/uL (ref 150–450)
RBC: 3.68 x10E6/uL — ABNORMAL LOW (ref 4.14–5.80)
RDW: 12.7 % (ref 11.6–15.4)
WBC: 6.5 10*3/uL (ref 3.4–10.8)

## 2021-06-22 LAB — COMPREHENSIVE METABOLIC PANEL
ALT: 2 IU/L (ref 0–44)
AST: 12 IU/L (ref 0–40)
Albumin/Globulin Ratio: 1.3 (ref 1.2–2.2)
Albumin: 3.9 g/dL (ref 3.7–4.7)
Alkaline Phosphatase: 86 IU/L (ref 44–121)
BUN/Creatinine Ratio: 22 (ref 10–24)
BUN: 16 mg/dL (ref 8–27)
Bilirubin Total: 0.4 mg/dL (ref 0.0–1.2)
CO2: 24 mmol/L (ref 20–29)
Calcium: 9.3 mg/dL (ref 8.6–10.2)
Chloride: 101 mmol/L (ref 96–106)
Creatinine, Ser: 0.74 mg/dL — ABNORMAL LOW (ref 0.76–1.27)
Globulin, Total: 3 g/dL (ref 1.5–4.5)
Glucose: 96 mg/dL (ref 70–99)
Potassium: 4.8 mmol/L (ref 3.5–5.2)
Sodium: 138 mmol/L (ref 134–144)
Total Protein: 6.9 g/dL (ref 6.0–8.5)
eGFR: 96 mL/min/{1.73_m2} (ref >59)

## 2021-06-23 ENCOUNTER — Encounter: Payer: Self-pay | Admitting: Family Medicine

## 2021-06-23 DIAGNOSIS — Z8781 Personal history of (healed) traumatic fracture: Secondary | ICD-10-CM | POA: Insufficient documentation

## 2021-06-23 DIAGNOSIS — D649 Anemia, unspecified: Secondary | ICD-10-CM | POA: Insufficient documentation

## 2021-06-23 HISTORY — DX: Personal history of (healed) traumatic fracture: Z87.81

## 2021-06-23 NOTE — Assessment & Plan Note (Signed)
Continue physical therapy.  Improving.

## 2021-06-23 NOTE — Assessment & Plan Note (Signed)
Stable on current medication.  

## 2021-06-23 NOTE — Assessment & Plan Note (Signed)
Improved hb 1 week after surgery.  Check cbc.

## 2021-06-23 NOTE — Assessment & Plan Note (Signed)
The current medical regimen is effective;  continue present plan and medications.  

## 2021-06-25 DIAGNOSIS — D692 Other nonthrombocytopenic purpura: Secondary | ICD-10-CM | POA: Diagnosis not present

## 2021-06-25 DIAGNOSIS — I5030 Unspecified diastolic (congestive) heart failure: Secondary | ICD-10-CM | POA: Diagnosis not present

## 2021-06-25 DIAGNOSIS — G20 Parkinson's disease: Secondary | ICD-10-CM | POA: Diagnosis not present

## 2021-06-25 DIAGNOSIS — Z7951 Long term (current) use of inhaled steroids: Secondary | ICD-10-CM | POA: Diagnosis not present

## 2021-06-25 DIAGNOSIS — I2510 Atherosclerotic heart disease of native coronary artery without angina pectoris: Secondary | ICD-10-CM | POA: Diagnosis not present

## 2021-06-25 DIAGNOSIS — E559 Vitamin D deficiency, unspecified: Secondary | ICD-10-CM | POA: Diagnosis not present

## 2021-06-25 DIAGNOSIS — E538 Deficiency of other specified B group vitamins: Secondary | ICD-10-CM | POA: Diagnosis not present

## 2021-06-25 DIAGNOSIS — D62 Acute posthemorrhagic anemia: Secondary | ICD-10-CM | POA: Diagnosis not present

## 2021-06-25 DIAGNOSIS — I110 Hypertensive heart disease with heart failure: Secondary | ICD-10-CM | POA: Diagnosis not present

## 2021-06-25 DIAGNOSIS — S72042D Displaced fracture of base of neck of left femur, subsequent encounter for closed fracture with routine healing: Secondary | ICD-10-CM | POA: Diagnosis not present

## 2021-06-25 DIAGNOSIS — Z7982 Long term (current) use of aspirin: Secondary | ICD-10-CM | POA: Diagnosis not present

## 2021-06-25 DIAGNOSIS — M4805 Spinal stenosis, thoracolumbar region: Secondary | ICD-10-CM | POA: Diagnosis not present

## 2021-06-25 DIAGNOSIS — G959 Disease of spinal cord, unspecified: Secondary | ICD-10-CM | POA: Diagnosis not present

## 2021-06-25 DIAGNOSIS — D892 Hypergammaglobulinemia, unspecified: Secondary | ICD-10-CM | POA: Diagnosis not present

## 2021-06-25 DIAGNOSIS — J449 Chronic obstructive pulmonary disease, unspecified: Secondary | ICD-10-CM | POA: Diagnosis not present

## 2021-06-25 DIAGNOSIS — R1311 Dysphagia, oral phase: Secondary | ICD-10-CM | POA: Diagnosis not present

## 2021-06-25 DIAGNOSIS — K5900 Constipation, unspecified: Secondary | ICD-10-CM | POA: Diagnosis not present

## 2021-06-25 DIAGNOSIS — G8929 Other chronic pain: Secondary | ICD-10-CM | POA: Diagnosis not present

## 2021-06-25 DIAGNOSIS — Z9181 History of falling: Secondary | ICD-10-CM | POA: Diagnosis not present

## 2021-06-25 DIAGNOSIS — M4802 Spinal stenosis, cervical region: Secondary | ICD-10-CM | POA: Diagnosis not present

## 2021-06-25 DIAGNOSIS — Z952 Presence of prosthetic heart valve: Secondary | ICD-10-CM | POA: Diagnosis not present

## 2021-06-26 ENCOUNTER — Encounter: Payer: Self-pay | Admitting: Sports Medicine

## 2021-06-26 ENCOUNTER — Ambulatory Visit: Payer: Medicare Other | Admitting: Sports Medicine

## 2021-06-26 ENCOUNTER — Other Ambulatory Visit: Payer: Self-pay

## 2021-06-26 DIAGNOSIS — I739 Peripheral vascular disease, unspecified: Secondary | ICD-10-CM

## 2021-06-26 DIAGNOSIS — G20 Parkinson's disease: Secondary | ICD-10-CM

## 2021-06-26 DIAGNOSIS — Z8673 Personal history of transient ischemic attack (TIA), and cerebral infarction without residual deficits: Secondary | ICD-10-CM

## 2021-06-26 DIAGNOSIS — B351 Tinea unguium: Secondary | ICD-10-CM

## 2021-06-26 DIAGNOSIS — M79674 Pain in right toe(s): Secondary | ICD-10-CM

## 2021-06-26 DIAGNOSIS — M79675 Pain in left toe(s): Secondary | ICD-10-CM | POA: Diagnosis not present

## 2021-06-26 NOTE — Progress Notes (Signed)
Subjective: Travis Reese is a 75 y.o. male patient seen today in office with complaint of mildly painful thickened and elongated toenails; unable to trim. Patient denies history of Diabetes or neuropathy but has a history of vascular disease and Parkinson's as previously noted and states that he broke his hip in Sept. Patient has no other pedal complaints at this time.   Patient Active Problem List   Diagnosis Date Noted   Postoperative anemia 06/23/2021   S/p left hip fracture 06/23/2021   Vitamin D deficiency 04/02/2021   Scoliosis of lumbosacral spine 04/02/2021   Rectal prolapse 04/02/2021   Mild dilation of ascending aorta ([place]) 04/02/2021   DDD (degenerative disc disease), cervical 04/02/2021   Cubital tunnel syndrome 04/02/2021   Arthritis 04/02/2021   Aortic valve sclerosis 04/02/2021   Hypertensive heart disease with chronic diastolic congestive heart failure ([place]) 04/02/2021   Parkinson's disease ([place]) 02/23/2021   Depression    History of CVA (cerebrovascular accident)    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     albuterol (VENTOLIN HFA) 108 (90 Base) MCG/ACT inhaler Inhale 2 puffs into the lungs every 6 (six) hours as needed for wheezing or shortness of breath.     aspirin EC 325 MG EC tablet Take 1 tablet (325 mg total) by mouth daily. 30 tablet 0   carbidopa-levodopa (SINEMET IR) 25-100 MG tablet Take 1 tablet by mouth 3 (three)  times daily. 1 po at 7am/11am/4pm 270 tablet 1   carvedilol (COREG) 25 MG tablet TAKE 1 TABLET BY MOUTH  TWICE DAILY WITH A MEAL 180 tablet 2   clonazePAM (KLONOPIN) 1 MG tablet One at night and one daily prn anxiety 45 tablet 2   Cyanocobalamin 1000 MCG SUBL Place 1,000 mcg under the tongue daily.     DULoxetine (CYMBALTA) 60 MG capsule Take 1 capsule (60 mg total) by mouth daily. 90 capsule 1   fluticasone (FLONASE) 50 MCG/ACT nasal spray Place 1 spray into both nostrils 3 times/day as needed-between meals & bedtime.     Melatonin 10 MG CAPS Take 5 mg by mouth at bedtime.     montelukast (SINGULAIR) 10 MG tablet Take 10 mg by mouth at bedtime.     primidone (MYSOLINE) 50 MG tablet Take 1 tablet (50 mg total) by mouth at bedtime. 90 tablet 1   spironolactone (ALDACTONE) 25 MG tablet Take 0.5 tablets (12.5 mg total) by mouth daily. 90 tablet 0   tamsulosin (FLOMAX) 0.4 MG CAPS capsule Take 0.4 mg by mouth 2 (two) times daily after a meal.     Vitamin D, Ergocalciferol, (DRISDOL) 1.25 MG (50000 UT) CAPS capsule Take 50,000 Units by mouth every Monday.     Zinc 50 MG TABS Take 50 mg by mouth at bedtime.     No current facility-administered medications on file prior to visit.    No Known Allergies  Objective: Physical Exam  General: Well developed, nourished, no acute distress,  awake, alert and oriented x 3  Vascular: Dorsalis pedis artery 1/4 bilateral, Posterior tibial artery 0/4 bilateral due to pitting edema at ankles 1+, skin temperature warm to warm proximal to distal bilateral lower extremities, moderate varicosities, scant pedal hair present bilateral.  Neurological: Gross sensation present via light touch bilateral.   Dermatological: Skin is warm, dry, and supple bilateral, Nails 1-10 are tender, long, thick, and discolored with mild subungal debris, no webspace macerations present bilateral, no open lesions present bilateral, no callus/corns/hyperkeratotic tissue present bilateral.  No signs of infection bilateral.  Musculoskeletal: No symptomatic boney deformities noted bilateral. Muscular strength within normal limits without painon range of motion. No pain with calf compression bilateral.  Assessment and Plan:  Problem List Items Addressed This Visit       Nervous and Auditory   Parkinson's disease ([place])     Other   History of CVA (cerebrovascular accident)   Other Visit Diagnoses     Pain due to onychomycosis of toenails of both feet    -  Primary   PVD (peripheral vascular disease) ([place])           -Examined patient.  -Discussed treatment options for painful mycotic nails. -Mechanically debrided and reduced mycotic nails with sterile nail nipper and dremel nail file without incident. -Encouraged elevation to assist with edema control -Patient to return in 3 months for follow up evaluation or sooner if symptoms worsen.  Landis Martins, DPM

## 2021-06-27 DIAGNOSIS — G20 Parkinson's disease: Secondary | ICD-10-CM | POA: Diagnosis not present

## 2021-06-27 DIAGNOSIS — E559 Vitamin D deficiency, unspecified: Secondary | ICD-10-CM | POA: Diagnosis not present

## 2021-06-27 DIAGNOSIS — G8929 Other chronic pain: Secondary | ICD-10-CM | POA: Diagnosis not present

## 2021-06-27 DIAGNOSIS — G959 Disease of spinal cord, unspecified: Secondary | ICD-10-CM | POA: Diagnosis not present

## 2021-06-27 DIAGNOSIS — I2510 Atherosclerotic heart disease of native coronary artery without angina pectoris: Secondary | ICD-10-CM | POA: Diagnosis not present

## 2021-06-27 DIAGNOSIS — J449 Chronic obstructive pulmonary disease, unspecified: Secondary | ICD-10-CM | POA: Diagnosis not present

## 2021-06-27 DIAGNOSIS — Z9181 History of falling: Secondary | ICD-10-CM | POA: Diagnosis not present

## 2021-06-27 DIAGNOSIS — Z7982 Long term (current) use of aspirin: Secondary | ICD-10-CM | POA: Diagnosis not present

## 2021-06-27 DIAGNOSIS — D62 Acute posthemorrhagic anemia: Secondary | ICD-10-CM | POA: Diagnosis not present

## 2021-06-27 DIAGNOSIS — E538 Deficiency of other specified B group vitamins: Secondary | ICD-10-CM | POA: Diagnosis not present

## 2021-06-27 DIAGNOSIS — M4802 Spinal stenosis, cervical region: Secondary | ICD-10-CM | POA: Diagnosis not present

## 2021-06-27 DIAGNOSIS — D892 Hypergammaglobulinemia, unspecified: Secondary | ICD-10-CM | POA: Diagnosis not present

## 2021-06-27 DIAGNOSIS — I110 Hypertensive heart disease with heart failure: Secondary | ICD-10-CM | POA: Diagnosis not present

## 2021-06-27 DIAGNOSIS — Z7951 Long term (current) use of inhaled steroids: Secondary | ICD-10-CM | POA: Diagnosis not present

## 2021-06-27 DIAGNOSIS — K5900 Constipation, unspecified: Secondary | ICD-10-CM | POA: Diagnosis not present

## 2021-06-27 DIAGNOSIS — R1311 Dysphagia, oral phase: Secondary | ICD-10-CM | POA: Diagnosis not present

## 2021-06-27 DIAGNOSIS — M4805 Spinal stenosis, thoracolumbar region: Secondary | ICD-10-CM | POA: Diagnosis not present

## 2021-06-27 DIAGNOSIS — S72042D Displaced fracture of base of neck of left femur, subsequent encounter for closed fracture with routine healing: Secondary | ICD-10-CM | POA: Diagnosis not present

## 2021-06-27 DIAGNOSIS — Z952 Presence of prosthetic heart valve: Secondary | ICD-10-CM | POA: Diagnosis not present

## 2021-06-27 DIAGNOSIS — I5030 Unspecified diastolic (congestive) heart failure: Secondary | ICD-10-CM | POA: Diagnosis not present

## 2021-06-27 DIAGNOSIS — D692 Other nonthrombocytopenic purpura: Secondary | ICD-10-CM | POA: Diagnosis not present

## 2021-07-02 DIAGNOSIS — G959 Disease of spinal cord, unspecified: Secondary | ICD-10-CM | POA: Diagnosis not present

## 2021-07-02 DIAGNOSIS — Z7951 Long term (current) use of inhaled steroids: Secondary | ICD-10-CM | POA: Diagnosis not present

## 2021-07-02 DIAGNOSIS — R1311 Dysphagia, oral phase: Secondary | ICD-10-CM | POA: Diagnosis not present

## 2021-07-02 DIAGNOSIS — Z7982 Long term (current) use of aspirin: Secondary | ICD-10-CM | POA: Diagnosis not present

## 2021-07-02 DIAGNOSIS — Z952 Presence of prosthetic heart valve: Secondary | ICD-10-CM | POA: Diagnosis not present

## 2021-07-02 DIAGNOSIS — D692 Other nonthrombocytopenic purpura: Secondary | ICD-10-CM | POA: Diagnosis not present

## 2021-07-02 DIAGNOSIS — G8929 Other chronic pain: Secondary | ICD-10-CM | POA: Diagnosis not present

## 2021-07-02 DIAGNOSIS — M4802 Spinal stenosis, cervical region: Secondary | ICD-10-CM | POA: Diagnosis not present

## 2021-07-02 DIAGNOSIS — G20 Parkinson's disease: Secondary | ICD-10-CM | POA: Diagnosis not present

## 2021-07-02 DIAGNOSIS — I5030 Unspecified diastolic (congestive) heart failure: Secondary | ICD-10-CM | POA: Diagnosis not present

## 2021-07-02 DIAGNOSIS — E538 Deficiency of other specified B group vitamins: Secondary | ICD-10-CM | POA: Diagnosis not present

## 2021-07-02 DIAGNOSIS — I2510 Atherosclerotic heart disease of native coronary artery without angina pectoris: Secondary | ICD-10-CM | POA: Diagnosis not present

## 2021-07-02 DIAGNOSIS — J449 Chronic obstructive pulmonary disease, unspecified: Secondary | ICD-10-CM | POA: Diagnosis not present

## 2021-07-02 DIAGNOSIS — I110 Hypertensive heart disease with heart failure: Secondary | ICD-10-CM | POA: Diagnosis not present

## 2021-07-02 DIAGNOSIS — S72042D Displaced fracture of base of neck of left femur, subsequent encounter for closed fracture with routine healing: Secondary | ICD-10-CM | POA: Diagnosis not present

## 2021-07-02 DIAGNOSIS — M4805 Spinal stenosis, thoracolumbar region: Secondary | ICD-10-CM | POA: Diagnosis not present

## 2021-07-02 DIAGNOSIS — D62 Acute posthemorrhagic anemia: Secondary | ICD-10-CM | POA: Diagnosis not present

## 2021-07-02 DIAGNOSIS — Z9181 History of falling: Secondary | ICD-10-CM | POA: Diagnosis not present

## 2021-07-02 DIAGNOSIS — D892 Hypergammaglobulinemia, unspecified: Secondary | ICD-10-CM | POA: Diagnosis not present

## 2021-07-02 DIAGNOSIS — K5900 Constipation, unspecified: Secondary | ICD-10-CM | POA: Diagnosis not present

## 2021-07-02 DIAGNOSIS — E559 Vitamin D deficiency, unspecified: Secondary | ICD-10-CM | POA: Diagnosis not present

## 2021-07-03 ENCOUNTER — Telehealth: Payer: Self-pay

## 2021-07-03 NOTE — Chronic Care Management (AMB) (Signed)
Chronic Care Management Pharmacy Assistant   Name: Travis Reese  MRN: [id number] DOB: 11-05-1945  Reason for Encounter: General Adherence Call    Recent office visits:  06/21/21 Rochel Brome MD. Seen for postoperative anemia. No med changes.   05/15/21 Jerrell Belfast NP. Seen for fall. Referral to Orthopedic Surgery. No med changes.   05/12/21 Orders Only. Re-ordered Clonazepam 1 mg at night prn. D/C Krill Oil 1000 mg, Celebrex 100 mg, Cialis 5 mg at patient preference.   Recent consult visits:  06/26/21 (Podiatry) Cannon Kettle, Titorya DPM. Seen for pain of toenail of both feet. Debridement performed. No med changes.   Hospital visits:  None since 04/24/21  Medications: Outpatient Encounter Medications as of 07/03/2021  Medication Sig   acetaminophen (TYLENOL) 325 MG tablet Take 2 tablets (650 mg total) by mouth every 6 (six) hours as needed for mild pain (or Fever >/= 101).   albuterol (VENTOLIN HFA) 108 (90 Base) MCG/ACT inhaler Inhale 2 puffs into the lungs every 6 (six) hours as needed for wheezing or shortness of breath.   aspirin EC 325 MG EC tablet Take 1 tablet (325 mg total) by mouth daily.   carbidopa-levodopa (SINEMET IR) 25-100 MG tablet Take 1 tablet by mouth 3 (three) times daily. 1 po at 7am/11am/4pm   carvedilol (COREG) 25 MG tablet TAKE 1 TABLET BY MOUTH  TWICE DAILY WITH A MEAL   clonazePAM (KLONOPIN) 1 MG tablet One at night and one daily prn anxiety   Cyanocobalamin 1000 MCG SUBL Place 1,000 mcg under the tongue daily.   DULoxetine (CYMBALTA) 60 MG capsule Take 1 capsule (60 mg total) by mouth daily.   fluticasone (FLONASE) 50 MCG/ACT nasal spray Place 1 spray into both nostrils 3 times/day as needed-between meals & bedtime.   Melatonin 10 MG CAPS Take 5 mg by mouth at bedtime.   montelukast (SINGULAIR) 10 MG tablet Take 10 mg by mouth at bedtime.   primidone (MYSOLINE) 50 MG tablet Take 1 tablet (50 mg total) by mouth at bedtime.   spironolactone (ALDACTONE) 25  MG tablet Take 0.5 tablets (12.5 mg total) by mouth daily.   tamsulosin (FLOMAX) 0.4 MG CAPS capsule Take 0.4 mg by mouth 2 (two) times daily after a meal.   Vitamin D, Ergocalciferol, (DRISDOL) 1.25 MG (50000 UT) CAPS capsule Take 50,000 Units by mouth every Monday.   Zinc 50 MG TABS Take 50 mg by mouth at bedtime.   No facility-administered encounter medications on file as of 07/03/2021.   [place] for general disease state and medication adherence call.   Patient is not > 5 days past due for refill on the following medications per chart history:  [facility] Medications: Medication Name/mg Last Fill Days Supply None    What concerns do you have about your medications? Pt is not having any concerns at the moment  The patient denies side effects with his medications.   How often do you forget or accidentally miss a dose? Never  Do you use a pillbox? Yes   Are you having any problems getting your medications from your pharmacy? No  Has the cost of your medications been a concern? No  Since last visit with CPP, no interventions have been made: No other changes have been made   The patient has not had an ED visit since last contact.   The patient denies problems with their health.   Patient states BP readings are as follows: Pt does not check his BP  at home. He stated he has PT coming out and will check is BP.    Pt had his flu shot on 05/25/21 and wanted this added to his file   Care Gaps: Last annual wellness visit? Scheduled 75/1/02 If applicable: N/A Last eye exam / retinopathy screening? Diabetic foot exam?    [place] Clinical Pharmacist Assitant [phone]

## 2021-07-09 ENCOUNTER — Other Ambulatory Visit: Payer: Self-pay

## 2021-07-09 MED ORDER — VITAMIN D (ERGOCALCIFEROL) 1.25 MG (50000 UNIT) PO CAPS: 50000.0000 [IU] | ORAL_CAPSULE | ORAL | 2 refills | Status: DC

## 2021-07-10 DIAGNOSIS — Z96642 Presence of left artificial hip joint: Secondary | ICD-10-CM | POA: Diagnosis not present

## 2021-07-10 DIAGNOSIS — S72002A Fracture of unspecified part of neck of left femur, initial encounter for closed fracture: Secondary | ICD-10-CM | POA: Diagnosis not present

## 2021-07-13 DIAGNOSIS — I2510 Atherosclerotic heart disease of native coronary artery without angina pectoris: Secondary | ICD-10-CM | POA: Diagnosis not present

## 2021-07-13 DIAGNOSIS — I110 Hypertensive heart disease with heart failure: Secondary | ICD-10-CM | POA: Diagnosis not present

## 2021-07-13 DIAGNOSIS — Z9181 History of falling: Secondary | ICD-10-CM | POA: Diagnosis not present

## 2021-07-13 DIAGNOSIS — D62 Acute posthemorrhagic anemia: Secondary | ICD-10-CM | POA: Diagnosis not present

## 2021-07-13 DIAGNOSIS — J449 Chronic obstructive pulmonary disease, unspecified: Secondary | ICD-10-CM | POA: Diagnosis not present

## 2021-07-13 DIAGNOSIS — K5900 Constipation, unspecified: Secondary | ICD-10-CM | POA: Diagnosis not present

## 2021-07-13 DIAGNOSIS — G20 Parkinson's disease: Secondary | ICD-10-CM | POA: Diagnosis not present

## 2021-07-13 DIAGNOSIS — D892 Hypergammaglobulinemia, unspecified: Secondary | ICD-10-CM | POA: Diagnosis not present

## 2021-07-13 DIAGNOSIS — Z7982 Long term (current) use of aspirin: Secondary | ICD-10-CM | POA: Diagnosis not present

## 2021-07-13 DIAGNOSIS — I5030 Unspecified diastolic (congestive) heart failure: Secondary | ICD-10-CM | POA: Diagnosis not present

## 2021-07-13 DIAGNOSIS — E559 Vitamin D deficiency, unspecified: Secondary | ICD-10-CM | POA: Diagnosis not present

## 2021-07-13 DIAGNOSIS — M4802 Spinal stenosis, cervical region: Secondary | ICD-10-CM | POA: Diagnosis not present

## 2021-07-13 DIAGNOSIS — G959 Disease of spinal cord, unspecified: Secondary | ICD-10-CM | POA: Diagnosis not present

## 2021-07-13 DIAGNOSIS — G8929 Other chronic pain: Secondary | ICD-10-CM | POA: Diagnosis not present

## 2021-07-13 DIAGNOSIS — S72042D Displaced fracture of base of neck of left femur, subsequent encounter for closed fracture with routine healing: Secondary | ICD-10-CM | POA: Diagnosis not present

## 2021-07-13 DIAGNOSIS — Z952 Presence of prosthetic heart valve: Secondary | ICD-10-CM | POA: Diagnosis not present

## 2021-07-13 DIAGNOSIS — D692 Other nonthrombocytopenic purpura: Secondary | ICD-10-CM | POA: Diagnosis not present

## 2021-07-13 DIAGNOSIS — M4805 Spinal stenosis, thoracolumbar region: Secondary | ICD-10-CM | POA: Diagnosis not present

## 2021-07-13 DIAGNOSIS — Z7951 Long term (current) use of inhaled steroids: Secondary | ICD-10-CM | POA: Diagnosis not present

## 2021-07-13 DIAGNOSIS — R1311 Dysphagia, oral phase: Secondary | ICD-10-CM | POA: Diagnosis not present

## 2021-07-13 DIAGNOSIS — E538 Deficiency of other specified B group vitamins: Secondary | ICD-10-CM | POA: Diagnosis not present

## 2021-07-17 ENCOUNTER — Ambulatory Visit: Payer: Medicare Other | Admitting: Cardiology

## 2021-07-17 DIAGNOSIS — G5620 Lesion of ulnar nerve, unspecified upper limb: Secondary | ICD-10-CM | POA: Diagnosis not present

## 2021-07-19 DIAGNOSIS — R1311 Dysphagia, oral phase: Secondary | ICD-10-CM | POA: Diagnosis not present

## 2021-07-19 DIAGNOSIS — E538 Deficiency of other specified B group vitamins: Secondary | ICD-10-CM | POA: Diagnosis not present

## 2021-07-19 DIAGNOSIS — G20 Parkinson's disease: Secondary | ICD-10-CM | POA: Diagnosis not present

## 2021-07-19 DIAGNOSIS — Z7951 Long term (current) use of inhaled steroids: Secondary | ICD-10-CM | POA: Diagnosis not present

## 2021-07-19 DIAGNOSIS — E559 Vitamin D deficiency, unspecified: Secondary | ICD-10-CM | POA: Diagnosis not present

## 2021-07-19 DIAGNOSIS — Z952 Presence of prosthetic heart valve: Secondary | ICD-10-CM | POA: Diagnosis not present

## 2021-07-19 DIAGNOSIS — D62 Acute posthemorrhagic anemia: Secondary | ICD-10-CM | POA: Diagnosis not present

## 2021-07-19 DIAGNOSIS — G8929 Other chronic pain: Secondary | ICD-10-CM | POA: Diagnosis not present

## 2021-07-19 DIAGNOSIS — Z9181 History of falling: Secondary | ICD-10-CM | POA: Diagnosis not present

## 2021-07-19 DIAGNOSIS — K5900 Constipation, unspecified: Secondary | ICD-10-CM | POA: Diagnosis not present

## 2021-07-19 DIAGNOSIS — D692 Other nonthrombocytopenic purpura: Secondary | ICD-10-CM | POA: Diagnosis not present

## 2021-07-19 DIAGNOSIS — M4805 Spinal stenosis, thoracolumbar region: Secondary | ICD-10-CM | POA: Diagnosis not present

## 2021-07-19 DIAGNOSIS — G959 Disease of spinal cord, unspecified: Secondary | ICD-10-CM | POA: Diagnosis not present

## 2021-07-19 DIAGNOSIS — I2510 Atherosclerotic heart disease of native coronary artery without angina pectoris: Secondary | ICD-10-CM | POA: Diagnosis not present

## 2021-07-19 DIAGNOSIS — J449 Chronic obstructive pulmonary disease, unspecified: Secondary | ICD-10-CM | POA: Diagnosis not present

## 2021-07-19 DIAGNOSIS — D892 Hypergammaglobulinemia, unspecified: Secondary | ICD-10-CM | POA: Diagnosis not present

## 2021-07-19 DIAGNOSIS — Z7982 Long term (current) use of aspirin: Secondary | ICD-10-CM | POA: Diagnosis not present

## 2021-07-19 DIAGNOSIS — I5030 Unspecified diastolic (congestive) heart failure: Secondary | ICD-10-CM | POA: Diagnosis not present

## 2021-07-19 DIAGNOSIS — S72042D Displaced fracture of base of neck of left femur, subsequent encounter for closed fracture with routine healing: Secondary | ICD-10-CM | POA: Diagnosis not present

## 2021-07-19 DIAGNOSIS — I110 Hypertensive heart disease with heart failure: Secondary | ICD-10-CM | POA: Diagnosis not present

## 2021-07-19 DIAGNOSIS — M4802 Spinal stenosis, cervical region: Secondary | ICD-10-CM | POA: Diagnosis not present

## 2021-07-20 NOTE — Progress Notes (Signed)
Assessment/Plan:   1.  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  -Would like to see the patient off of daytime clonazepam, and would like to see nighttime dose reduced from 1 mg to 0.5 mg.  He is a fall risk.  2.  History of cerebral infarction, with moderate sized PFO  -Patient is on aspirin  -Patient is on statin.  -Likely nothing else further to do given patient's current medical state.  Likely not a surgical candidate.  He has had a few appointments with cardiology that he has canceled.  I would encourage him to follow back up with cardiology to get opinion.  3.  Carpal tunnel/ulnar neuropathy  -I have been asked to provide medical clearance for his upcoming surgery.  Discussed that he will need to get medical clearance from his medical doctor.  -Patient will need cardiac clearance from his cardiologist given the PFO.  -From a stroke standpoint, it has been over 6 months since his last infarct.  Patient is fully optimized from a neurologic/Parkinson's standpoint for surgery.  However, he is at increased risk for another infarct given the PFO and having to go off of ASA.  He recognizes that and he states he has an appointment with his cardiologist coming up in December.  I would not recommend doing the surgery until he has cardiac clearance.   Subjective:   Travis Reese was seen today in follow up for Parkinsons disease.  My previous records were reviewed prior to todays visit as well as outside records available to me.   Pt accompanied by significant other Travis Reese) who supplements the hx. Last visit, at the end of June, I saw the patient on video.  I started him on levodopa at that time.  he had an abnormal DaTscan, and family history of Parkinson's disease.  He had been on Abilify and just taken off.  I felt he likely had  Parkinson's disease, worsened significantly by Abilify.  We started him on levodopa.  He reports that he is doing much better.  In mid September, he fell getting out of the bed and fractured his hip and subsequently had surgery.  He went to rehab at [facility].  He then fell while at the nursing facility when he went to the bathroom and forgot to take the walker.  Fortunately, he did not fracture anything at that point in time.  He is back home now.  He did his last home PT yesterday.  He is no longer using a walker or cane.    Patient with cerebral infarcts in April.  His carotid ultrasound was negative.  Patient was already on aspirin.  I recommended a TEE.  That was completed since our last visit.  This demonstrated left ventricular ejection fraction of 55 to 60%.  Saline contrast bubble study was positive, with moderately sized PFO with right-to-left shunting.  Patient was to follow-up with cardiology after that test.  Patient did have a few appointments scheduled, but ended up having to cancel this because he fell and broke the hip.  As a side note, the patients son died since our last visit.  He was in a motor vehicle accident.  Current prescribed movement disorder medications: carbidopa/levodopa 25/100, 9am/1pm/5pm   PREVIOUS MEDICATIONS: Sinemet  ALLERGIES:  No Known Allergies  CURRENT MEDICATIONS:  Outpatient Encounter Medications as of  07/24/2021  Medication Sig   albuterol (VENTOLIN HFA) 108 (90 Base) MCG/ACT inhaler Inhale 2 puffs into the lungs every 6 (six) hours as needed for wheezing or shortness of breath.   aspirin EC 325 MG EC tablet Take 1 tablet (325 mg total) by mouth daily.   carvedilol (COREG) 25 MG tablet TAKE 1 TABLET BY MOUTH  TWICE DAILY WITH A MEAL   clonazePAM (KLONOPIN) 1 MG tablet One at night and one daily prn anxiety   Cyanocobalamin 1000 MCG SUBL Place 1,000 mcg under the tongue daily.   diclofenac (VOLTAREN) 75 MG EC tablet Take 75 mg by mouth 2 (two) times  daily.   DULoxetine (CYMBALTA) 60 MG capsule Take 1 capsule (60 mg total) by mouth daily.   fluticasone (FLONASE) 50 MCG/ACT nasal spray Place 1 spray into both nostrils 3 times/day as needed-between meals & bedtime.   Melatonin 10 MG CAPS Take 5 mg by mouth at bedtime.   montelukast (SINGULAIR) 10 MG tablet Take 10 mg by mouth at bedtime.   primidone (MYSOLINE) 50 MG tablet Take 1 tablet (50 mg total) by mouth at bedtime.   spironolactone (ALDACTONE) 25 MG tablet Take 0.5 tablets (12.5 mg total) by mouth daily.   tamsulosin (FLOMAX) 0.4 MG CAPS capsule Take 0.4 mg by mouth 2 (two) times daily after a meal.   Vitamin D, Ergocalciferol, (DRISDOL) 1.25 MG (50000 UNIT) CAPS capsule Take 1 capsule (50,000 Units total) by mouth every Monday.   Zinc 50 MG TABS Take 50 mg by mouth at bedtime.   [DISCONTINUED] carbidopa-levodopa (SINEMET IR) 25-100 MG tablet Take 1 tablet by mouth 3 (three) times daily. 1 po at 7am/11am/4pm   acetaminophen (TYLENOL) 325 MG tablet Take 2 tablets (650 mg total) by mouth every 6 (six) hours as needed for mild pain (or Fever >/= 101).   carbidopa-levodopa (SINEMET IR) 25-100 MG tablet Take 1.5 tablets by mouth 3 (three) times daily. 9am/1pm/5pm   No facility-administered encounter medications on file as of 07/24/2021.    Objective:   PHYSICAL EXAMINATION:    VITALS:   Vitals:   07/24/21 1259  BP: 132/64  Pulse: 72  SpO2: 96%  Weight: 190 lb 3.2 oz (86.3 kg)  Height: 5\' 9"  (1.753 m)    GEN:  The patient appears stated age and is in NAD. HEENT:  Normocephalic, atraumatic.  The mucous membranes are moist. The superficial temporal arteries are without ropiness or tenderness. CV:  RRR Lungs:  CTAB Neck/HEME:  There are no carotid bruits bilaterally.  Neurological examination:  Orientation: The patient is alert and oriented x3. Cranial nerves: There is good facial symmetry with facial hypomimia. The speech is fluent and clear. Soft palate rises symmetrically  and there is no tongue deviation. Hearing is intact to conversational tone. Sensation: Sensation is intact to light touch throughout Motor: Strength is at least antigravity x4.  Movement examination: Tone: There is mild increased tone in the LUE Abnormal movements: rare tremor in the LUE Coordination:  There is mild decremation with RAM's, with any form of RAMS, including alternating supination and pronation of the forearm, hand opening and closing, finger taps, heel taps and toe taps on the L Gait and Station: The patient has min difficulty arising out of a deep-seated chair without the use of the hands. The patient's stride length is decreased and is wide based but markedly better than last visit.    I have reviewed and interpreted the following labs independently    Chemistry  Component Value Date/Time   NA 138 06/21/2021 1345   K 4.8 06/21/2021 1345   CL 101 06/21/2021 1345   CO2 24 06/21/2021 1345   BUN 16 06/21/2021 1345   CREATININE 0.74 (L) 06/21/2021 1345   CREATININE 0.77 07/21/2017 1401      Component Value Date/Time   CALCIUM 9.3 06/21/2021 1345   ALKPHOS 86 06/21/2021 1345   AST 12 06/21/2021 1345   ALT 2 06/21/2021 1345   BILITOT 0.4 06/21/2021 1345       Lab Results  Component Value Date   WBC 6.5 06/21/2021   HGB 12.7 (L) 06/21/2021   HCT 37.5 06/21/2021   MCV 102 (H) 06/21/2021   PLT 240 06/21/2021    Lab Results  Component Value Date   TSH 1.240 04/19/2021     Total time spent on today's visit was 59minutes, including both face-to-face time and nonface-to-face time.  Time included that spent on review of records (prior notes available to me/labs/imaging if pertinent), discussing treatment and goals, answering patient's questions and coordinating care.  Cc:  Rochel Brome, MD

## 2021-07-23 DIAGNOSIS — G8929 Other chronic pain: Secondary | ICD-10-CM | POA: Diagnosis not present

## 2021-07-23 DIAGNOSIS — M4805 Spinal stenosis, thoracolumbar region: Secondary | ICD-10-CM | POA: Diagnosis not present

## 2021-07-23 DIAGNOSIS — I2510 Atherosclerotic heart disease of native coronary artery without angina pectoris: Secondary | ICD-10-CM | POA: Diagnosis not present

## 2021-07-23 DIAGNOSIS — K5900 Constipation, unspecified: Secondary | ICD-10-CM | POA: Diagnosis not present

## 2021-07-23 DIAGNOSIS — I110 Hypertensive heart disease with heart failure: Secondary | ICD-10-CM | POA: Diagnosis not present

## 2021-07-23 DIAGNOSIS — Z952 Presence of prosthetic heart valve: Secondary | ICD-10-CM | POA: Diagnosis not present

## 2021-07-23 DIAGNOSIS — Z7951 Long term (current) use of inhaled steroids: Secondary | ICD-10-CM | POA: Diagnosis not present

## 2021-07-23 DIAGNOSIS — D692 Other nonthrombocytopenic purpura: Secondary | ICD-10-CM | POA: Diagnosis not present

## 2021-07-23 DIAGNOSIS — J449 Chronic obstructive pulmonary disease, unspecified: Secondary | ICD-10-CM | POA: Diagnosis not present

## 2021-07-23 DIAGNOSIS — M4802 Spinal stenosis, cervical region: Secondary | ICD-10-CM | POA: Diagnosis not present

## 2021-07-23 DIAGNOSIS — E559 Vitamin D deficiency, unspecified: Secondary | ICD-10-CM | POA: Diagnosis not present

## 2021-07-23 DIAGNOSIS — I5030 Unspecified diastolic (congestive) heart failure: Secondary | ICD-10-CM | POA: Diagnosis not present

## 2021-07-23 DIAGNOSIS — R1311 Dysphagia, oral phase: Secondary | ICD-10-CM | POA: Diagnosis not present

## 2021-07-23 DIAGNOSIS — Z7982 Long term (current) use of aspirin: Secondary | ICD-10-CM | POA: Diagnosis not present

## 2021-07-23 DIAGNOSIS — D62 Acute posthemorrhagic anemia: Secondary | ICD-10-CM | POA: Diagnosis not present

## 2021-07-23 DIAGNOSIS — G959 Disease of spinal cord, unspecified: Secondary | ICD-10-CM | POA: Diagnosis not present

## 2021-07-23 DIAGNOSIS — S72042D Displaced fracture of base of neck of left femur, subsequent encounter for closed fracture with routine healing: Secondary | ICD-10-CM | POA: Diagnosis not present

## 2021-07-23 DIAGNOSIS — Z9181 History of falling: Secondary | ICD-10-CM | POA: Diagnosis not present

## 2021-07-23 DIAGNOSIS — D892 Hypergammaglobulinemia, unspecified: Secondary | ICD-10-CM | POA: Diagnosis not present

## 2021-07-23 DIAGNOSIS — G20 Parkinson's disease: Secondary | ICD-10-CM | POA: Diagnosis not present

## 2021-07-23 DIAGNOSIS — E538 Deficiency of other specified B group vitamins: Secondary | ICD-10-CM | POA: Diagnosis not present

## 2021-07-24 ENCOUNTER — Ambulatory Visit (INDEPENDENT_AMBULATORY_CARE_PROVIDER_SITE_OTHER): Payer: Medicare Other | Admitting: Neurology

## 2021-07-24 ENCOUNTER — Encounter: Payer: Self-pay | Admitting: Neurology

## 2021-07-24 ENCOUNTER — Other Ambulatory Visit: Payer: Self-pay

## 2021-07-24 VITALS — BP 132/64 | HR 72 | Ht 69.0 in | Wt 190.2 lb

## 2021-07-24 DIAGNOSIS — G20 Parkinson's disease: Secondary | ICD-10-CM

## 2021-07-24 DIAGNOSIS — Q2112 Patent foramen ovale: Secondary | ICD-10-CM

## 2021-07-24 MED ORDER — CARBIDOPA-LEVODOPA 25-100 MG PO TABS: 1.5000 | ORAL_TABLET | Freq: Three times a day (TID) | ORAL | 1 refills | Status: DC

## 2021-07-24 NOTE — Patient Instructions (Addendum)
Increase carbidopa/levodopa 25/100 to 1.5 tablets three times per day Start Carbidopa Levodopa as follows:    As a reminder, carbidopa/levodopa can be taken at the same time as a carbohydrate, but we like to have you take your pill either 30 minutes before a protein source or 1 hour after as protein can interfere with carbidopa/levodopa absorption.  Online Resources for Power over Parkinson's Group November 2022  Local [place] Online Groups  Power over [employer] Group :   Power Over Parkinson's Patient Education Group will be Wednesday, November 9th-*Hybrid meting*- in person at [place] location and via [facility] at 2:00 pm.   Upcoming Power over Parkinson's Meetings:  2nd Wednesdays of the month at 2 pm:  November 9th, December 14th Chical at [email or web address] if interested in participating in this online group Parkinson's Care Partners Group:    3rd Mondays, Contact Misty Paladino Atypical Parkinsonian Patient Group:   4th Wednesdays, [place] If you are interested in participating in these online groups with Misty, please contact her directly for how to join those meetings.  Her contact information is [email or web address].   [place]:  www.[email or web address] PD Health at Home continues:  Mindfulness Mondays, Expert Briefing Tuesdays, Wellness Wednesdays, Take Time Thursdays, Fitness Fridays -Listings for June 2022 are on the website Upcoming Webinar:  Expert Briefing:  Let's Talk about Dementia.  Wednesday, November 2nd  at 1 pm. Register for Armed forces operational officer) at [email or web address]  Please check out their website to sign up for emails and see their full online offerings  [place]:  [email or web address]  Upcoming Webinar:   2022 in Review:  Progress Toward Better Treatment and Prevention.  Thursday, November 17th at 12  noon Check out additional information on their website to see their full online offerings  [place]:  [email or web address] Upcoming Webinar:  NOH (Neurogenic Orthostatic Hypotension):  What it is, How to Know if you Have it, and How to Manage it.  Tuesday, November 15th at 3 pm.  Webinar Series:  Living with Parkinson's Meetup.   Third Thursdays of each month at 3 pm; next one is November 17th Care Partner Monthly Meetup.  With Robin Searing Phinney.  First Tuesday of each month, 2 pm Joy Breaks:  First Wednesday of each month, 2-3 pm. There will be art, doodling, making, crafting, listening, laughing, stories, and everything in between. No art experience necessary. No supplies required. Just show up for joy!  Register on their website. Check out additional information to Live Well Today on their website  Parkinson and Movement Disorders (PMD) Alliance:  www.pmdalliance.org NeuroLife Online:  Online Education Events Sign up for emails, which are sent weekly to give you updates on programming and online offerings  Parkinson's Association of the [place]:  www.[email or web address] Information on online support groups, education events, and online exercises including Yoga, Parkinson's exercises and more-LOTS of information on links to PD resources and online events Virtual Support Group through Parkinson's Association of the [place]; next one is scheduled for Wednesday, November 2nd  at 2 pm.  (These are typically scheduled for the 1st Wednesday of the month at 2 pm).  Visit website for details.  Additional links for movement activities: Parkinson's DRUMMING Classes/Music Therapy with Doylene Canning:  This is a returning class and it's FREE!  2nd Mondays, continuing November 14th.  Contact *Misty Taylor-Paladino at [email or web address] or Doylene Canning at [phone] or [email or web address]  PWR! Moves Classes at [place].   Wednesdays 10 and 11  am.  Contact Amy Marriott, PT [email or web address] if interested. Here is a link to the PWR!Moves classes on Zoom from [place] - Daily Mon-Sat at 10:00. Via Zoom, FREE and open to all.  There is also a link below via Facebook if you use that platform. [email or web address] Parkinson's Wellness Recovery (PWR! Moves)  www.[email or web address] Info on the PWR! Virtual Experience:  You will have access to our expertise through self-assessment, guided plans that start with the PD-specific fundamentals, educational content, tips, Q&A with an expert, and a growing Art therapist of PD-specific pre-recorded and live exercise classes of varying types and intensity - both physical and cognitive! If that is not enough, we offer 1:1 wellness consultations (in-person or virtual) to personalize your PWR! Research scientist (medical).  [place] Fridays:  As part of the PD Health @ Home program, this free video series focuses each week on one aspect of fitness designed to support people living with Parkinson's.  These weekly videos highlight the [place] recent fitness guidelines for people with Parkinson's disease.  HollywoodSale.dk Dance for PD website is offering free, live-stream classes throughout the week, as well as links to [employer] of classes:  [email or web address] Dance for Parkinson's Class:  [place].  Free offering for people with Parkinson's and care partners; virtual class.  For more information, contact [phone] or email Ruffin Frederick at [email or web address] Virtual dance and Pilates for Parkinson's classes: Click on the Community  Tab> Parkinson's Movement Initiative Tab.  To register for classes and for more information, visit [email or web address] and click the "community" tab.  [employer] Parkinson's Cycling Classes  [employer]: 1pm on Fridays-Live classes at [employer] ([employer] at [email or web address] or [phone]) [employer]: Virtual Classes Mondays and Thursdays Jeanette Caprice classes Tuesday, Wednesday and Thursday (contact [place] at [email or web address]  or [phone]) [place] Varied levels of classes are offered Mondays, Tuesdays and Thursdays at [employer].  To observe a class or for more information, call [phone] or email [email or web address] [employer]: Online Caregiver Education Opportunities:  www.well-[email or web address]/caregiver-education/[email or web address]-support-group.  You may also contact Vickki Muff at [email or web address] or [phone].   *Multiple opportunities below, as November is Caregiver Month!* A Guide to Physical and Mental Fitness for Family Caregivers.  Wednesday, November 9th, 12:30-2 pm at [facility] of You!  Tuesday, November 15th, 11:30-12:45.  [facility], [place] 03-08-1992 Understanding Care Options and Advanced Care Planning.  Monday, November 21st, 4:30-5:45.  [place], [place] above to register. Well-Spring Navigator:  [address] program, a free service to help individuals and families through the journey of determining care for older adults.  The "Navigator" is a [employer], Education officer, museum, who will speak with a prospective client and/or loved ones to provide an assessment of the situation and a set of recommendations for a personalized care plan -- all free of charge, and whether [facility] [employer] offers the needed service or not. If the need is not a service we provide, we are  well-connected with reputable programs in town that we can refer you to.  www.well-[email or web address] or to speak with the Navigator, call [phone].

## 2021-07-30 DIAGNOSIS — D62 Acute posthemorrhagic anemia: Secondary | ICD-10-CM | POA: Diagnosis not present

## 2021-07-30 DIAGNOSIS — G959 Disease of spinal cord, unspecified: Secondary | ICD-10-CM | POA: Diagnosis not present

## 2021-07-30 DIAGNOSIS — Z9181 History of falling: Secondary | ICD-10-CM | POA: Diagnosis not present

## 2021-07-30 DIAGNOSIS — I2510 Atherosclerotic heart disease of native coronary artery without angina pectoris: Secondary | ICD-10-CM | POA: Diagnosis not present

## 2021-07-30 DIAGNOSIS — E559 Vitamin D deficiency, unspecified: Secondary | ICD-10-CM | POA: Diagnosis not present

## 2021-07-30 DIAGNOSIS — D892 Hypergammaglobulinemia, unspecified: Secondary | ICD-10-CM | POA: Diagnosis not present

## 2021-07-30 DIAGNOSIS — J449 Chronic obstructive pulmonary disease, unspecified: Secondary | ICD-10-CM | POA: Diagnosis not present

## 2021-07-30 DIAGNOSIS — Z7982 Long term (current) use of aspirin: Secondary | ICD-10-CM | POA: Diagnosis not present

## 2021-07-30 DIAGNOSIS — I110 Hypertensive heart disease with heart failure: Secondary | ICD-10-CM | POA: Diagnosis not present

## 2021-07-30 DIAGNOSIS — K5900 Constipation, unspecified: Secondary | ICD-10-CM | POA: Diagnosis not present

## 2021-07-30 DIAGNOSIS — D692 Other nonthrombocytopenic purpura: Secondary | ICD-10-CM | POA: Diagnosis not present

## 2021-07-30 DIAGNOSIS — G8929 Other chronic pain: Secondary | ICD-10-CM | POA: Diagnosis not present

## 2021-07-30 DIAGNOSIS — E538 Deficiency of other specified B group vitamins: Secondary | ICD-10-CM | POA: Diagnosis not present

## 2021-07-30 DIAGNOSIS — Z7951 Long term (current) use of inhaled steroids: Secondary | ICD-10-CM | POA: Diagnosis not present

## 2021-07-30 DIAGNOSIS — S72042D Displaced fracture of base of neck of left femur, subsequent encounter for closed fracture with routine healing: Secondary | ICD-10-CM | POA: Diagnosis not present

## 2021-07-30 DIAGNOSIS — I5030 Unspecified diastolic (congestive) heart failure: Secondary | ICD-10-CM | POA: Diagnosis not present

## 2021-07-30 DIAGNOSIS — M4805 Spinal stenosis, thoracolumbar region: Secondary | ICD-10-CM | POA: Diagnosis not present

## 2021-07-30 DIAGNOSIS — R1311 Dysphagia, oral phase: Secondary | ICD-10-CM | POA: Diagnosis not present

## 2021-07-30 DIAGNOSIS — G20 Parkinson's disease: Secondary | ICD-10-CM | POA: Diagnosis not present

## 2021-07-30 DIAGNOSIS — M4802 Spinal stenosis, cervical region: Secondary | ICD-10-CM | POA: Diagnosis not present

## 2021-07-30 DIAGNOSIS — Z952 Presence of prosthetic heart valve: Secondary | ICD-10-CM | POA: Diagnosis not present

## 2021-08-07 ENCOUNTER — Encounter: Payer: Self-pay | Admitting: Family Medicine

## 2021-08-07 ENCOUNTER — Other Ambulatory Visit: Payer: Self-pay

## 2021-08-07 ENCOUNTER — Ambulatory Visit (INDEPENDENT_AMBULATORY_CARE_PROVIDER_SITE_OTHER): Payer: Medicare Other | Admitting: Family Medicine

## 2021-08-07 VITALS — BP 136/82 | HR 72 | Temp 97.3°F | Resp 16 | Ht 69.0 in | Wt 193.0 lb

## 2021-08-07 DIAGNOSIS — G20 Parkinson's disease: Secondary | ICD-10-CM

## 2021-08-07 DIAGNOSIS — I110 Hypertensive heart disease with heart failure: Secondary | ICD-10-CM | POA: Diagnosis not present

## 2021-08-07 DIAGNOSIS — N401 Enlarged prostate with lower urinary tract symptoms: Secondary | ICD-10-CM | POA: Diagnosis not present

## 2021-08-07 DIAGNOSIS — E782 Mixed hyperlipidemia: Secondary | ICD-10-CM | POA: Diagnosis not present

## 2021-08-07 DIAGNOSIS — M48062 Spinal stenosis, lumbar region with neurogenic claudication: Secondary | ICD-10-CM | POA: Diagnosis not present

## 2021-08-07 DIAGNOSIS — R338 Other retention of urine: Secondary | ICD-10-CM

## 2021-08-07 DIAGNOSIS — I5032 Chronic diastolic (congestive) heart failure: Secondary | ICD-10-CM

## 2021-08-07 DIAGNOSIS — Z23 Encounter for immunization: Secondary | ICD-10-CM

## 2021-08-07 LAB — POCT URINALYSIS DIP (CLINITEK)
Bilirubin, UA: NEGATIVE
Blood, UA: NEGATIVE
Glucose, UA: NEGATIVE mg/dL
Ketones, POC UA: NEGATIVE mg/dL
Nitrite, UA: NEGATIVE
POC PROTEIN,UA: NEGATIVE
Spec Grav, UA: 1.02 (ref 1.010–1.025)
Urobilinogen, UA: NEGATIVE E.U./dL — AB
pH, UA: 6 (ref 5.0–8.0)

## 2021-08-07 IMAGING — NM NM DATSCAN
2 series · 24 of 30 positions shown · non-contrast
Comparison: Brain MRI 01/22/2021

CLINICAL DATA: Upper extremity weakness and central tremor since
November 02, 18. RIGHT hand tremors. Gait instability. 74-year-old male

EXAM:
NUCLEAR MEDICINE BRAIN IMAGING WITH SPECT  (DaTscan )
TECHNIQUE: SPECT images of the brain were obtained after intravenous injection
of radiopharmaceutical. 4 hour post injection imaging. Appropriate
positioning. 130 mg i-STAT given orally for thyroid blockade.
RADIOPHARMACEUTICALS:  4.6 millicuries I 123 Ioflupane

[Series 1: dat scan · 4.14mm/px · 5 of 120 frames shown]
[frame 11/120  full-range]
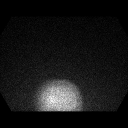
[frame 31/120  full-range]
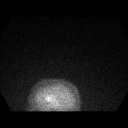
[frame 71/120  full-range]
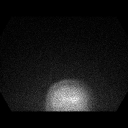
[frame 91/120  full-range]
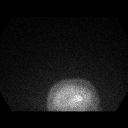
[frame 111/120  full-range]
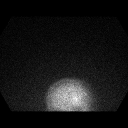

[Series 1014: mpr (id) range · 0.92mm/px · 19 of 33 slices shown]
[im 1/33]
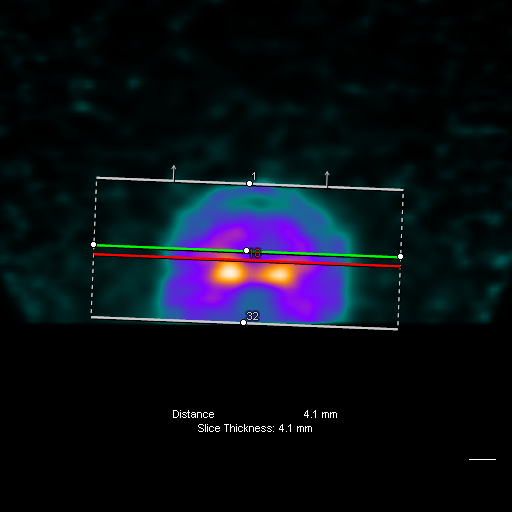
[im 3/33]
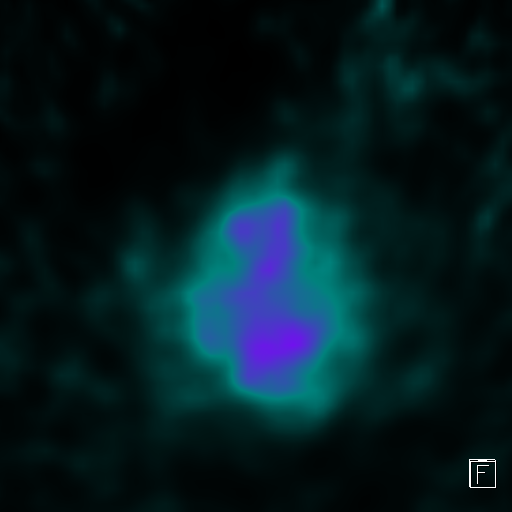
[im 5/33]
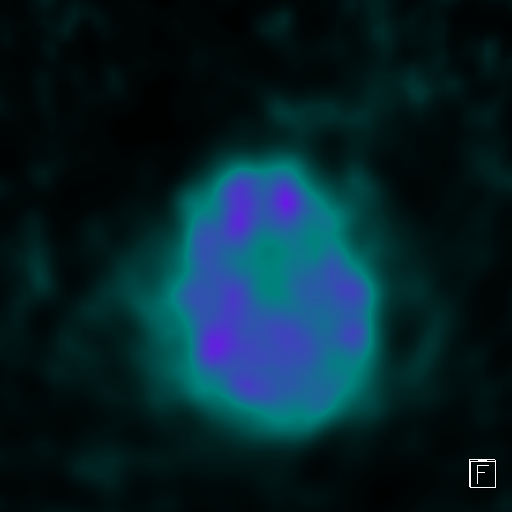
[im 6/33]
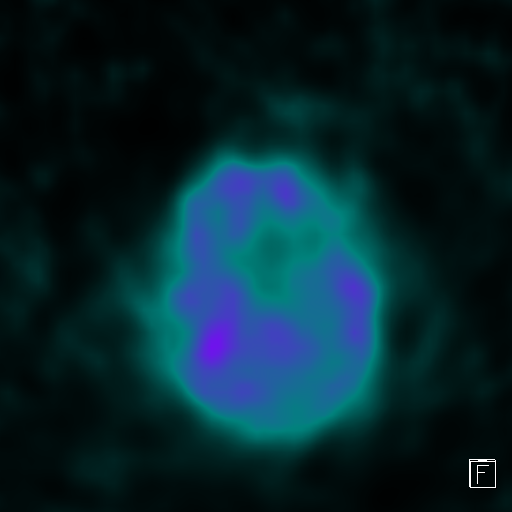
[im 7/33]
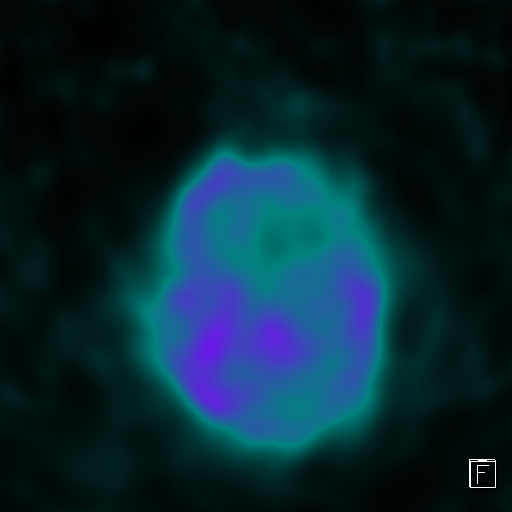
[im 10/33]
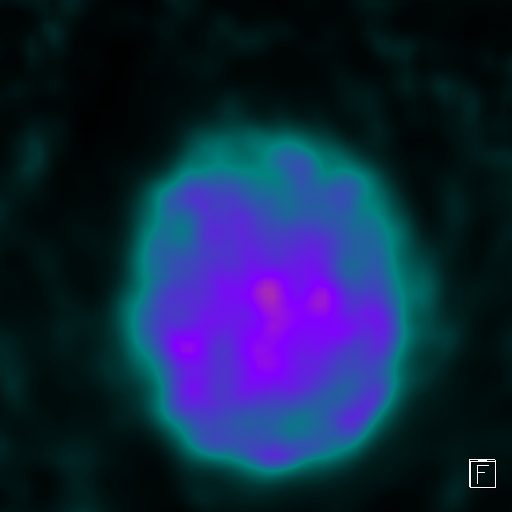
[im 12/33]
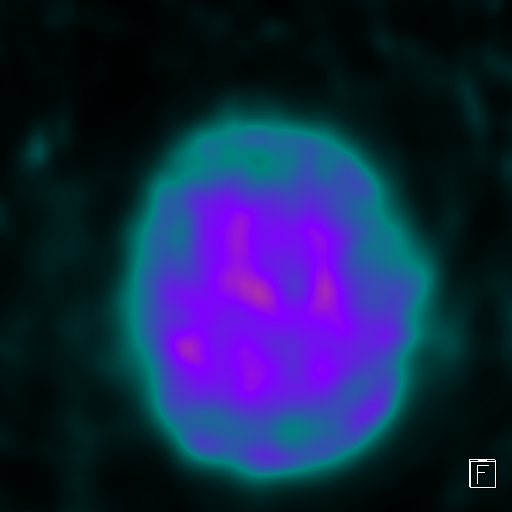
[im 13/33]
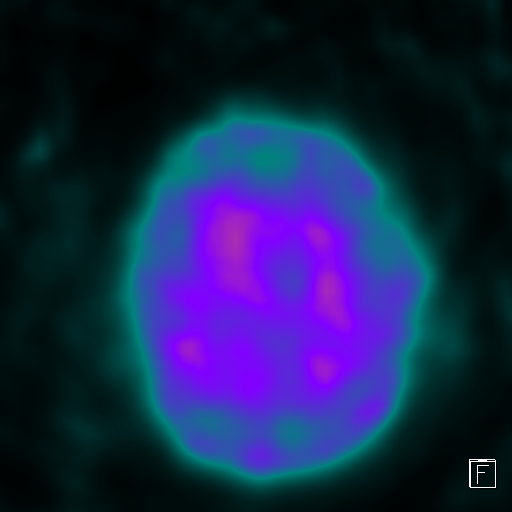
[im 14/33]
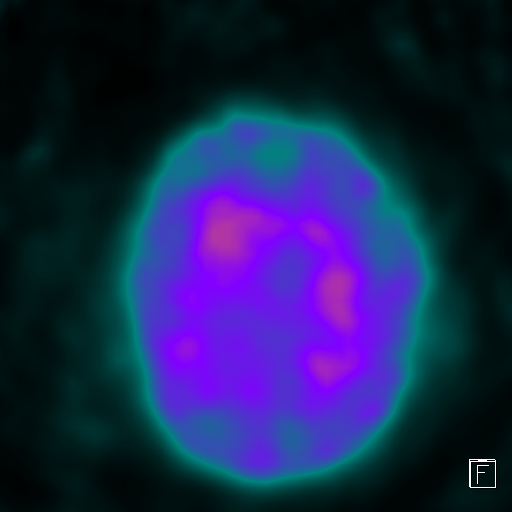
[im 17/33]
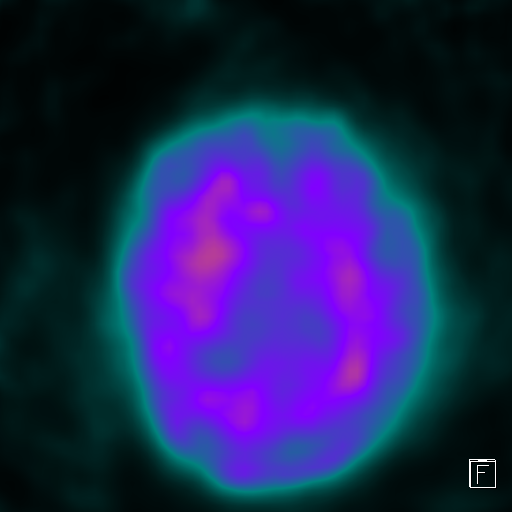
[im 19/33]
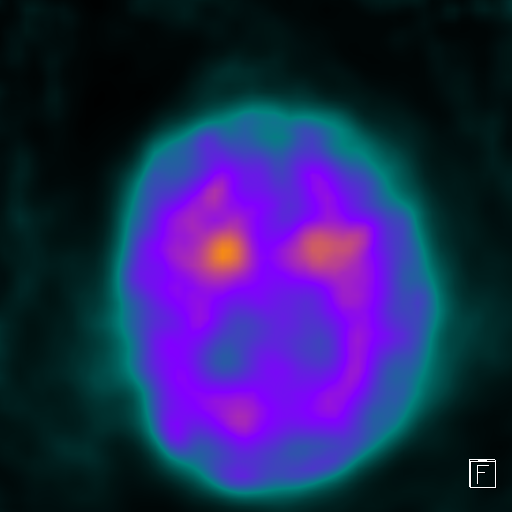
[im 20/33]
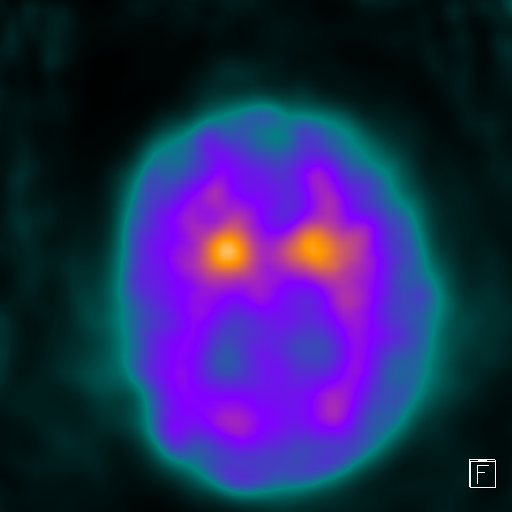
[im 21/33]
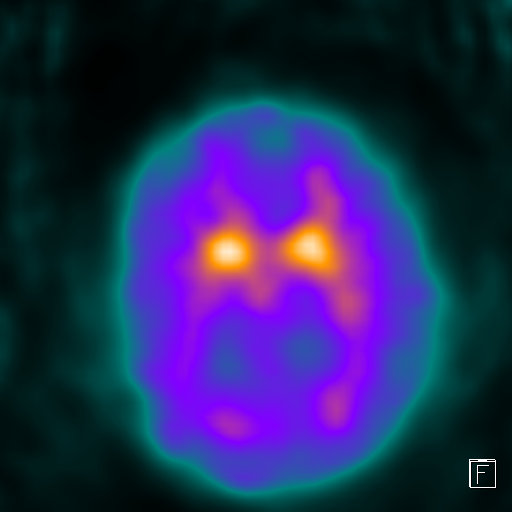
[im 24/33]
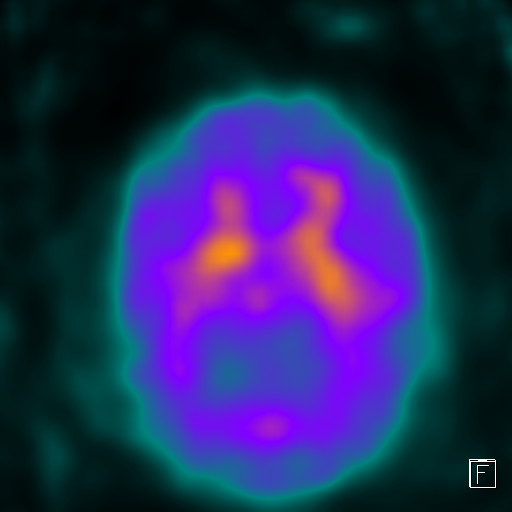
[im 26/33]
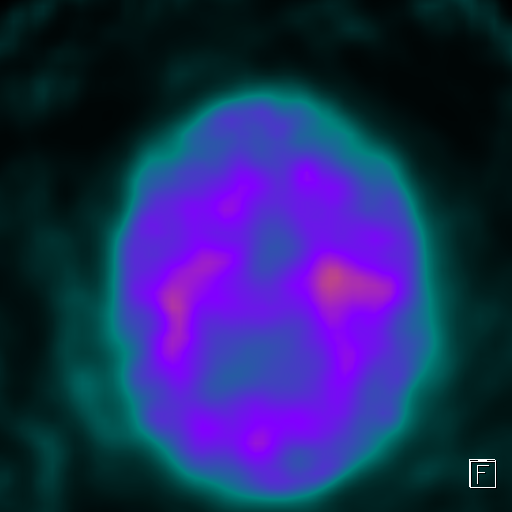
[im 27/33]
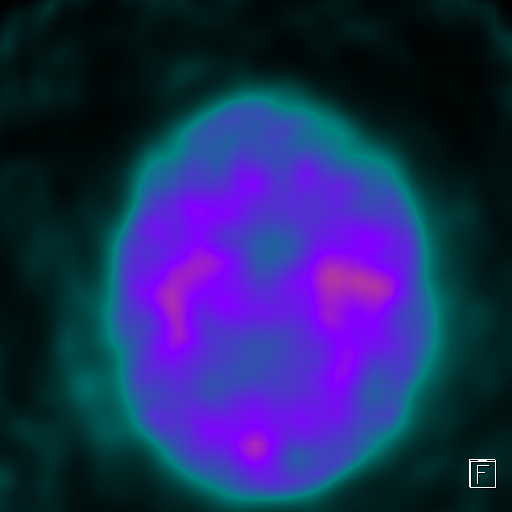
[im 28/33]
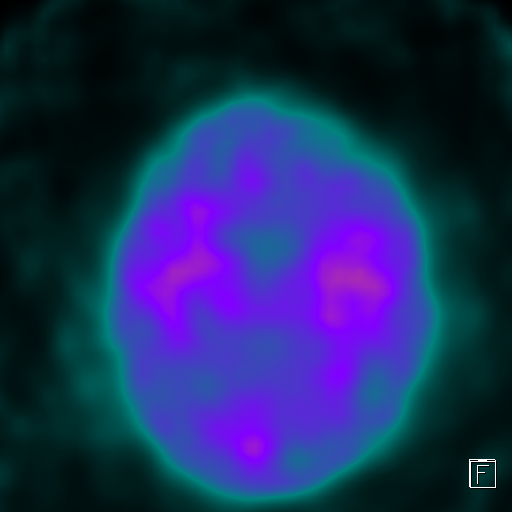
[im 31/33]
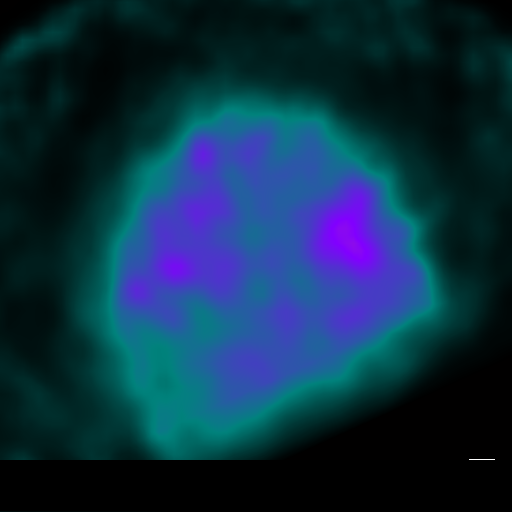
[im 33/33]
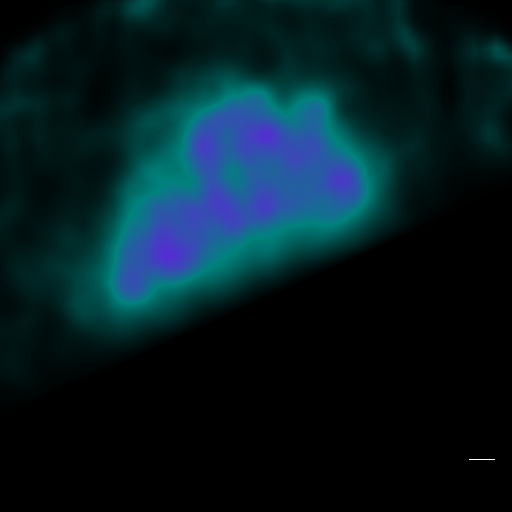

[24 of 30 positions shown; findings below may reference images not displayed]

FINDINGS: Symmetric decreased radiotracer activity within the bilateral
posterior striata (putamen). Relatively maintained normal activity
in the heads of the caudate nuclei.
IMPRESSION: Decreased radiotracer activity in the posterior striata is a pattern
which can be associated with Parkinsonian syndrome pathology.

Of note, DaTSCAN is not diagnostic of Parkinsonian syndromes, which
remains a clinical diagnosis. DaTscan is an adjuvant test to aid in
the clinical diagnosis of Parkinsonian syndromes.

## 2021-08-07 MED ORDER — CLONAZEPAM 1 MG PO TABS: ORAL_TABLET | ORAL | 2 refills | Status: DC

## 2021-08-07 MED ORDER — DULOXETINE HCL 60 MG PO CPEP: 60.0000 mg | ORAL_CAPSULE | Freq: Two times a day (BID) | ORAL | 1 refills | Status: DC

## 2021-08-07 MED ORDER — TADALAFIL 5 MG PO TABS
5.0000 mg | ORAL_TABLET | Freq: Every day | ORAL | 3 refills | Status: DC
Start: 2021-08-07 — End: 2021-08-10

## 2021-08-07 NOTE — Progress Notes (Signed)
Subjective:  Patient ID: Travis Reese, male    DOB: [birth year]  Age: 75 y.o. MRN: [id number]  Chief Complaint  Patient presents with   Parkinson's Disease   BPH   Depression    HPI Parkinson's disease: Pt saw Dr. Carles Collet and she increased his sinemet.  Current Medications: Sinemet IR 25-100mg  take 1.5 tablets TID, Primidone 50mg  1 tablet daily.  BPH: Current medications: flomax 0.4mg  1 tablet BID.  Bipolar Depression:  Current Medications: cymbalta 60 mg one tablet twice daily.  Hypertensive heart disease with diastolic CHF: ON carvedilol 25 mg bid, spironolactone 25 mg 1/2 daily.   B12 deficiency: B12 1000 mcg SL daily.     Current Outpatient Medications on File Prior to Visit  Medication Sig Dispense Refill   acetaminophen (TYLENOL) 325 MG tablet Take 2 tablets (650 mg total) by mouth every 6 (six) hours as needed for mild pain (or Fever >/= 101).     albuterol (VENTOLIN HFA) 108 (90 Base) MCG/ACT inhaler Inhale 2 puffs into the lungs every 6 (six) hours as needed for wheezing or shortness of breath.     aspirin EC 325 MG EC tablet Take 1 tablet (325 mg total) by mouth daily. 30 tablet 0   carbidopa-levodopa (SINEMET IR) 25-100 MG tablet Take 1.5 tablets by mouth 3 (three) times daily. 9am/1pm/5pm 405 tablet 1   Cyanocobalamin 1000 MCG SUBL Place 1,000 mcg under the tongue daily.     diclofenac (VOLTAREN) 75 MG EC tablet Take 75 mg by mouth 2 (two) times daily.     fluticasone (FLONASE) 50 MCG/ACT nasal spray Place 1 spray into both nostrils 3 times/day as needed-between meals & bedtime.     Melatonin 10 MG CAPS Take 5 mg by mouth at bedtime.     montelukast (SINGULAIR) 10 MG tablet Take 10 mg by mouth at bedtime.     spironolactone (ALDACTONE) 25 MG tablet Take 0.5 tablets (12.5 mg total) by mouth daily. 90 tablet 0   Vitamin D, Ergocalciferol, (DRISDOL) 1.25 MG (50000 UNIT) CAPS capsule Take 1 capsule (50,000 Units total) by mouth every Monday. 12 capsule 2   Zinc 50 MG  TABS Take 50 mg by mouth at bedtime.     No current facility-administered medications on file prior to visit.   Past Medical History:  Diagnosis Date   Allergic rhinosinusitis    Anxiety    Aortic valve sclerosis    Arthritis    knees, neck, shoulder   CHF (congestive heart failure) (HCC)    Cubital tunnel syndrome    DDD (degenerative disc disease), cervical    Depression    Elevated PSA    denies per patient   HTN (hypertension)    Hyperlipidemia    Hyperplasia of prostate with lower urinary tract symptoms (LUTS)    Hypertension    Mild dilation of ascending aorta (HCC)    Parkinson disease (HCC)    Prolapsed internal hemorrhoids, grade 3 04/19/2015   Rectal prolapse    Scoliosis of lumbosacral spine    Stroke ([place])    x2   Tremor    Vitamin D deficiency    Wears hearing aid    bilateral   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   BENTALL PROCEDURE N/A 05/01/2018   Procedure: BIOLOGICAL BENTALL PROCEDURE AORTIC ROOT REPLACEMENT WITH 30MM VALSALVA GRAFT & 27MM INSPIRIS VALVE.;  Surgeon: Rexene Alberts, MD;  Location: [facility];  Service: Open Heart  Surgery;  Laterality: N/A;   BUBBLE STUDY  04/13/2021   Procedure: BUBBLE STUDY;  Surgeon: Buford Dresser, MD;  Location: [facility] ENDOSCOPY;  Service: Cardiovascular;;   CATARACT EXTRACTION Bilateral 04/2018   COLONOSCOPY  2007   NASAL SINUS SURGERY  2006   RIGHT/LEFT HEART CATH AND CORONARY ANGIOGRAPHY N/A 04/27/2018   Procedure: RIGHT/LEFT HEART CATH AND CORONARY ANGIOGRAPHY;  Surgeon: Jolaine Artist, MD;  Location: [facility] CV LAB;  Service: Cardiovascular;  Laterality: N/A;   TEE WITHOUT CARDIOVERSION N/A 04/27/2018   Procedure: TRANSESOPHAGEAL ECHOCARDIOGRAM (TEE);  Surgeon: Jolaine Artist, MD;  Location: [facility] ENDOSCOPY;  Service: Cardiovascular;  Laterality: N/A;   TEE WITHOUT CARDIOVERSION N/A 05/01/2018   Procedure: TRANSESOPHAGEAL ECHOCARDIOGRAM (TEE);   Surgeon: Rexene Alberts, MD;  Location: [facility];  Service: Open Heart Surgery;  Laterality: N/A;   TEE WITHOUT CARDIOVERSION N/A 04/13/2021   Procedure: TRANSESOPHAGEAL ECHOCARDIOGRAM (TEE);  Surgeon: Buford Dresser, MD;  Location: [facility] ENDOSCOPY;  Service: Cardiovascular;  Laterality: N/A;   TRANSANAL HEMORRHOIDAL DEARTERIALIZATION N/A 11/15/2015   Procedure: TRANSANAL HEMORRHOIDAL DEARTERIALIZATION;  Surgeon: Leighton Ruff, MD;  Location: [facility];  Service: General;  Laterality: N/A;    Family History  Problem Relation Age of Onset   COPD Mother    Heart attack Mother    Colon polyps Father    Prostate cancer Father    Parkinson's disease Father    Hyperlipidemia Father    Hypertension Sister    Hypertension Brother    Breast cancer Sister    Prostate cancer Paternal Uncle    Social History   Socioeconomic History   Marital status: Single    Spouse name: Not on file   Number of children: 2   Years of education: Not on file   Highest education level: Not on file  Occupational History   Occupation: retired    Comment: security guard  Tobacco Use   Smoking status: Some Days    Packs/day: 0.10    Years: 50.00    Pack years: 5.00    Types: Cigarettes    Last attempt to quit: 05/2018    Years since quitting: 3.2   Smokeless tobacco: Never  Vaping Use   Vaping Use: Former  Substance and Sexual Activity   Alcohol use: Not Currently    Comment: Occasional   Drug use: No   Sexual activity: Not Currently  Other Topics Concern   Not on file  Social History Narrative   Divorced lives with father after incarceration x 5 yrs   1 son, 1 daughter   Psychologist, occupational work   3 caffeine/day   04/19/2015   Right handed   Social Determinants of Health   Financial Resource Strain: Low Risk    Difficulty of Paying Living Expenses: Not hard at all  Food Insecurity: No Food Insecurity   Worried About Charity fundraiser in the Last Year: Never true   [place] in the Last Year: Never true  Transportation Needs: No Transportation Needs   Lack of Transportation (Medical): No   Lack of Transportation (Non-Medical): No  Physical Activity: Sufficiently Active   Days of Exercise per Week: 7 days   Minutes of Exercise per Session: 60 min  Stress: No Stress Concern Present   Feeling of Stress : Not at all  Social Connections: Moderately Isolated   Frequency of Communication with Friends and Family: More than three times a week   Frequency of Social Gatherings with Friends and  Family: Three times a week   Attends Religious Services: Never   Active Member of Clubs or Organizations: No   Attends Archivist Meetings: Never   Marital Status: Living with partner    Review of Systems  Constitutional:  Negative for chills and fever.  HENT:  Negative for congestion, ear pain, rhinorrhea and sore throat.   Respiratory:  Negative for cough and shortness of breath.   Cardiovascular:  Negative for chest pain and palpitations.  Gastrointestinal:  Negative for abdominal pain, constipation, diarrhea, nausea and vomiting.  Genitourinary:  Positive for frequency. Negative for dysuria and urgency.  Musculoskeletal:  Positive for arthralgias, back pain (Had 2 spinal injections this morning. Dr. Dennard Nip) and myalgias.  Neurological:  Positive for tremors. Negative for dizziness and headaches.  Psychiatric/Behavioral:  Negative for dysphoric mood. The patient is not nervous/anxious and is not hyperactive.     Objective:  BP 136/82   Pulse 72   Temp (!) 97.3 F (36.3 C)   Resp 16   Ht 5\' 9"  (1.753 m)   Wt 193 lb (87.5 kg)   BMI 28.50 kg/m   BP/Weight 08/15/2021 08/10/2021 74/05/4495  Systolic BP 759 163 846  Diastolic BP 86 76 82  Wt. (Lbs) 192.6 190 193  BMI 24.73 24.39 28.5    Physical Exam Vitals reviewed.  Constitutional:      Appearance: Normal appearance.  Neck:     Vascular: No carotid bruit.  Cardiovascular:     Rate and  Rhythm: Normal rate and regular rhythm.     Heart sounds: Normal heart sounds.  Pulmonary:     Effort: Pulmonary effort is normal.     Breath sounds: Normal breath sounds. No wheezing, rhonchi or rales.  Abdominal:     General: Bowel sounds are normal.     Palpations: Abdomen is soft.     Tenderness: There is no abdominal tenderness.  Neurological:     Mental Status: He is alert.     Comments: Shuffling gait.  Shaking tremor.   Psychiatric:        Mood and Affect: Mood normal.        Behavior: Behavior normal.    Diabetic Foot Exam - Simple   No data filed      Lab Results  Component Value Date   WBC 6.5 06/21/2021   HGB 12.7 (L) 06/21/2021   HCT 37.5 06/21/2021   PLT 240 06/21/2021   GLUCOSE 96 06/21/2021   CHOL 149 04/19/2021   TRIG 66 04/19/2021   HDL 52 04/19/2021   LDLCALC 84 04/19/2021   ALT 2 06/21/2021   AST 12 06/21/2021   NA 138 06/21/2021   K 4.8 06/21/2021   CL 101 06/21/2021   CREATININE 0.74 (L) 06/21/2021   BUN 16 06/21/2021   CO2 24 06/21/2021   TSH 1.240 04/19/2021   INR 1.58 05/01/2018      Assessment & Plan:   Problem List Items Addressed This Visit       Cardiovascular and Mediastinum   Hypertensive heart disease with chronic diastolic congestive heart failure ([place])    The current medical regimen is effective;  continue present plan and medications.        Relevant Medications   carvedilol (COREG) 25 MG tablet     Nervous and Auditory   Parkinson's disease (HCC)    Worsening.  The current medical regimen is effective;  continue present plan and medications.       Relevant Medications  primidone (MYSOLINE) 50 MG tablet   clonazePAM (KLONOPIN) 1 MG tablet     Genitourinary   BPH (benign prostatic hyperplasia) - Primary    The current medical regimen is effective;  continue present plan and medications.       Relevant Medications   tamsulosin (FLOMAX) 0.4 MG CAPS capsule   Other Relevant Orders   Testosterone,Free  and Total (Completed)   POCT URINALYSIS DIP (CLINITEK) (Completed)   Urine Culture (Completed)     Other   Mixed hyperlipidemia   Relevant Medications   carvedilol (COREG) 25 MG tablet   Other Visit Diagnoses     Need for pneumococcal vaccination       Relevant Orders   Pneumococcal polysaccharide vaccine 23-valent greater than or equal to 2yo subcutaneous/IM (Completed)     .  Meds ordered this encounter  Medications   DISCONTD: tadalafil (CIALIS) 5 MG tablet    Sig: Take 1 tablet (5 mg total) by mouth daily.    Dispense:  90 tablet    Refill:  3   DULoxetine (CYMBALTA) 60 MG capsule    Sig: Take 1 capsule (60 mg total) by mouth 2 (two) times daily.    Dispense:  180 capsule    Refill:  1   DISCONTD: clonazePAM (KLONOPIN) 1 MG tablet    Sig: One at night and one daily prn anxiety    Dispense:  45 tablet    Refill:  2   tamsulosin (FLOMAX) 0.4 MG CAPS capsule    Sig: Take 1 capsule (0.4 mg total) by mouth 2 (two) times daily after a meal.    Dispense:  180 capsule    Refill:  0   primidone (MYSOLINE) 50 MG tablet    Sig: Take 1 tablet (50 mg total) by mouth at bedtime.    Dispense:  90 tablet    Refill:  1   carvedilol (COREG) 25 MG tablet    Sig: Take 1 tablet (25 mg total) by mouth 2 (two) times daily with a meal.    Dispense:  1 tablet    Refill:  0    Requesting 1 year supply   clonazePAM (KLONOPIN) 1 MG tablet    Sig: Take 1 tablet (1 mg total) by mouth daily as needed for anxiety. One at night and one daily prn anxiety    Dispense:  1 tablet    Refill:  0    Orders Placed This Encounter  Procedures   Urine Culture   Pneumococcal polysaccharide vaccine 23-valent greater than or equal to 2yo subcutaneous/IM   Testosterone,Free and Total   POCT URINALYSIS DIP (CLINITEK)     Follow-up: Return in about 3 days (around 08/10/2021) for awv.  An After Visit Summary was printed and given to the patient.  Rochel Brome, MD [facility] [phone]

## 2021-08-07 NOTE — Patient Instructions (Signed)
Increase duloxetine to 60 mg one twice a day

## 2021-08-08 MED ORDER — TAMSULOSIN HCL 0.4 MG PO CAPS: 0.4000 mg | ORAL_CAPSULE | Freq: Two times a day (BID) | ORAL | 0 refills | Status: DC

## 2021-08-08 MED ORDER — PRIMIDONE 50 MG PO TABS: 50.0000 mg | ORAL_TABLET | Freq: Every day | ORAL | 1 refills | Status: DC

## 2021-08-09 ENCOUNTER — Ambulatory Visit: Payer: Medicare Other

## 2021-08-09 LAB — TESTOSTERONE,FREE AND TOTAL
Testosterone, Free: 2.6 pg/mL — ABNORMAL LOW (ref 6.6–18.1)
Testosterone: 415 ng/dL (ref 264–916)

## 2021-08-10 ENCOUNTER — Other Ambulatory Visit: Payer: Self-pay | Admitting: Family Medicine

## 2021-08-10 ENCOUNTER — Encounter: Payer: Self-pay | Admitting: Nurse Practitioner

## 2021-08-10 ENCOUNTER — Other Ambulatory Visit: Payer: Self-pay | Admitting: Nurse Practitioner

## 2021-08-10 ENCOUNTER — Ambulatory Visit (INDEPENDENT_AMBULATORY_CARE_PROVIDER_SITE_OTHER): Payer: Medicare Other | Admitting: Nurse Practitioner

## 2021-08-10 ENCOUNTER — Other Ambulatory Visit: Payer: Self-pay

## 2021-08-10 VITALS — BP 136/76 | HR 62 | Temp 97.2°F | Ht 74.0 in | Wt 190.0 lb

## 2021-08-10 DIAGNOSIS — Z0000 Encounter for general adult medical examination without abnormal findings: Secondary | ICD-10-CM

## 2021-08-10 DIAGNOSIS — N401 Enlarged prostate with lower urinary tract symptoms: Secondary | ICD-10-CM

## 2021-08-10 LAB — URINE CULTURE

## 2021-08-10 MED ORDER — TADALAFIL 5 MG PO TABS: 5.0000 mg | ORAL_TABLET | Freq: Every day | ORAL | 3 refills | Status: DC

## 2021-08-10 MED ORDER — NITROFURANTOIN MONOHYD MACRO 100 MG PO CAPS: 100.0000 mg | ORAL_CAPSULE | Freq: Two times a day (BID) | ORAL | 0 refills | Status: DC

## 2021-08-10 NOTE — Progress Notes (Signed)
Subjective:   Travis Reese is a 75 y.o. male who presents for Medicare Annual/Subsequent preventive examination.  Review of Systems    All WNL Cardiac Risk Factors include: advanced age      Objective:    Today's Vitals   08/10/21 1056  Weight: 190 lb (86.2 kg)  Height: 6\' 2"  (1.88 m)   Body mass index is 24.39 kg/m.  Advanced Directives 08/10/2021 04/13/2021 02/23/2021 01/20/2021 01/19/2021 01/18/2021 12/14/2020  Does Patient Have a Medical Advance Directive? No No No No No No No  Does patient want to make changes to medical advance directive? - - - - - - -  Would patient like information on creating a medical advance directive? No - Patient declined No - Patient declined - No - Patient declined No - Patient declined - No - Patient declined    Current Medications (verified) Outpatient Encounter Medications as of 08/10/2021  Medication Sig   CEPHALEXIN PO Take by mouth.   acetaminophen (TYLENOL) 325 MG tablet Take 2 tablets (650 mg total) by mouth every 6 (six) hours as needed for mild pain (or Fever >/= 101).   albuterol (VENTOLIN HFA) 108 (90 Base) MCG/ACT inhaler Inhale 2 puffs into the lungs every 6 (six) hours as needed for wheezing or shortness of breath.   aspirin EC 325 MG EC tablet Take 1 tablet (325 mg total) by mouth daily.   carbidopa-levodopa (SINEMET IR) 25-100 MG tablet Take 1.5 tablets by mouth 3 (three) times daily. 9am/1pm/5pm   carvedilol (COREG) 25 MG tablet TAKE 1 TABLET BY MOUTH  TWICE DAILY WITH A MEAL   clonazePAM (KLONOPIN) 1 MG tablet One at night and one daily prn anxiety   Cyanocobalamin 1000 MCG SUBL Place 1,000 mcg under the tongue daily.   diclofenac (VOLTAREN) 75 MG EC tablet Take 75 mg by mouth 2 (two) times daily.   DULoxetine (CYMBALTA) 60 MG capsule Take 1 capsule (60 mg total) by mouth 2 (two) times daily.   fluticasone (FLONASE) 50 MCG/ACT nasal spray Place 1 spray into both nostrils 3 times/day as needed-between meals & bedtime.   Melatonin  10 MG CAPS Take 5 mg by mouth at bedtime.   montelukast (SINGULAIR) 10 MG tablet Take 10 mg by mouth at bedtime.   primidone (MYSOLINE) 50 MG tablet Take 1 tablet (50 mg total) by mouth at bedtime.   spironolactone (ALDACTONE) 25 MG tablet Take 0.5 tablets (12.5 mg total) by mouth daily.   tadalafil (CIALIS) 5 MG tablet Take 1 tablet (5 mg total) by mouth daily.   tamsulosin (FLOMAX) 0.4 MG CAPS capsule Take 1 capsule (0.4 mg total) by mouth 2 (two) times daily after a meal.   Vitamin D, Ergocalciferol, (DRISDOL) 1.25 MG (50000 UNIT) CAPS capsule Take 1 capsule (50,000 Units total) by mouth every Monday.   Zinc 50 MG TABS Take 50 mg by mouth at bedtime.   No facility-administered encounter medications on file as of 08/10/2021.    Allergies (verified) Patient has no known allergies.   History: Past Medical History:  Diagnosis Date   Allergic rhinosinusitis    Anxiety    Aortic valve sclerosis    Arthritis    knees, neck, shoulder   CHF (congestive heart failure) (HCC)    Cubital tunnel syndrome    DDD (degenerative disc disease), cervical    Depression    Elevated PSA    denies per patient   HTN (hypertension)    Hyperlipidemia    Hyperplasia of prostate  with lower urinary tract symptoms (LUTS)    Hypertension    Mild dilation of ascending aorta (HCC)    Parkinson disease (HCC)    Prolapsed internal hemorrhoids, grade 3 04/19/2015   Rectal prolapse    Scoliosis of lumbosacral spine    Stroke [facility])    x2   Tremor    Vitamin D deficiency    Wears hearing aid    bilateral   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   BENTALL PROCEDURE N/A 05/01/2018   Procedure: BIOLOGICAL BENTALL PROCEDURE AORTIC ROOT REPLACEMENT WITH 30MM VALSALVA GRAFT & 27MM INSPIRIS VALVE.;  Surgeon: Rexene Alberts, MD;  Location: [place];  Service: Open Heart Surgery;  Laterality: N/A;   BUBBLE STUDY  04/13/2021   Procedure: BUBBLE STUDY;  Surgeon:  Buford Dresser, MD;  Location: [facility] ENDOSCOPY;  Service: Cardiovascular;;   CATARACT EXTRACTION Bilateral 04/2018   COLONOSCOPY  2007   NASAL SINUS SURGERY  2006   RIGHT/LEFT HEART CATH AND CORONARY ANGIOGRAPHY N/A 04/27/2018   Procedure: RIGHT/LEFT HEART CATH AND CORONARY ANGIOGRAPHY;  Surgeon: Jolaine Artist, MD;  Location: [place] CV LAB;  Service: Cardiovascular;  Laterality: N/A;   TEE WITHOUT CARDIOVERSION N/A 04/27/2018   Procedure: TRANSESOPHAGEAL ECHOCARDIOGRAM (TEE);  Surgeon: Jolaine Artist, MD;  Location: [facility] ENDOSCOPY;  Service: Cardiovascular;  Laterality: N/A;   TEE WITHOUT CARDIOVERSION N/A 05/01/2018   Procedure: TRANSESOPHAGEAL ECHOCARDIOGRAM (TEE);  Surgeon: Rexene Alberts, MD;  Location: [place];  Service: Open Heart Surgery;  Laterality: N/A;   TEE WITHOUT CARDIOVERSION N/A 04/13/2021   Procedure: TRANSESOPHAGEAL ECHOCARDIOGRAM (TEE);  Surgeon: Buford Dresser, MD;  Location: [facility] ENDOSCOPY;  Service: Cardiovascular;  Laterality: N/A;   TRANSANAL HEMORRHOIDAL DEARTERIALIZATION N/A 11/15/2015   Procedure: TRANSANAL HEMORRHOIDAL DEARTERIALIZATION;  Surgeon: Leighton Ruff, MD;  Location: [place];  Service: General;  Laterality: N/A;   Family History  Problem Relation Age of Onset   COPD Mother    Heart attack Mother    Colon polyps Father    Prostate cancer Father    Parkinson's disease Father    Hyperlipidemia Father    Hypertension Sister    Hypertension Brother    Breast cancer Sister    Prostate cancer Paternal Uncle    Social History   Socioeconomic History   Marital status: Single    Spouse name: Not on file   Number of children: 2   Years of education: Not on file   Highest education level: Not on file  Occupational History   Occupation: retired    Comment: security guard  Tobacco Use   Smoking status: Some Days    Packs/day: 0.10    Years: 50.00    Pack years: 5.00    Types: Cigarettes    Last attempt to  quit: 05/2018    Years since quitting: 3.2   Smokeless tobacco: Never  Vaping Use   Vaping Use: Former  Substance and Sexual Activity   Alcohol use: Not Currently    Comment: Occasional   Drug use: No   Sexual activity: Not Currently  Other Topics Concern   Not on file  Social History Narrative   Divorced lives with father after incarceration x 5 yrs   1 son, 1 daughter   Volunteer work   3 caffeine/day   04/19/2015   Right handed   Social Determinants of Health   Financial Resource Strain: Not on file  Food Insecurity: No Food  Insecurity   Worried About Charity fundraiser in the Last Year: Never true   [place] in the Last Year: Never true  Transportation Needs: No Transportation Needs   Lack of Transportation (Medical): No   Lack of Transportation (Non-Medical): No  Physical Activity: Not on file  Stress: Not on file  Social Connections: Not on file    Tobacco Counseling Ready to quit: Not Answered Counseling given: Not Answered   Clinical Intake:  Pre-visit preparation completed: No  Pain : No/denies pain     Nutritional Status: BMI of 19-24  Normal Diabetes: No  How often do you need to have someone help you when you read instructions, pamphlets, or other written materials from your doctor or pharmacy?: 1 - Never  Diabetic?No   Interpreter Needed?: No      Activities of Daily Living In your present state of health, do you have any difficulty performing the following activities: 08/10/2021 04/10/2021  Hearing? N N  Vision? N N  Difficulty concentrating or making decisions? N N  Walking or climbing stairs? N N  Dressing or bathing? N N  Doing errands, shopping? N N  Comment - Prefers not to due to Parkinson's Disease  Preparing Food and eating ? N -  Using the Toilet? N -  In the past six months, have you accidently leaked urine? N -  Do you have problems with loss of bowel control? N -  Managing your Medications? N -  Managing your  Finances? N -  Housekeeping or managing your Housekeeping? N -  Some recent data might be hidden    Patient Care Team: Rochel Brome, MD as PCP - General (Internal Medicine) Tat, Eustace Quail, DO as Consulting Physician (Neurology) Park Liter, MD as Consulting Physician (Cardiology) Starling Manns, MD (Orthopedic Surgery) Burnice Logan, [facility] (Inactive) as Pharmacist (Pharmacist)  Indicate any recent Medical Services you may have received from other than [facility] providers in the past year (date may be approximate).     Assessment:   This is a routine wellness examination for [employer].  Hearing/Vision screen No results found.  Dietary issues and exercise activities discussed: Current Exercise Habits: Home exercise routine, Type of exercise: strength training/weights;walking;stretching, Time (Minutes): 60, Frequency (Times/Week): 5, Weekly Exercise (Minutes/Week): 300, Exercise limited by: neurologic condition(s) (Parkinson's Disease)   Goals Addressed   None   Depression Screen PHQ 2/9 Scores 08/07/2021 04/10/2021 02/01/2015  PHQ - 2 Score 3 0 0  PHQ- 9 Score 10 - -    Fall Risk Fall Risk  06/21/2021 04/23/2021 04/10/2021 02/23/2021 01/18/2021  Falls in the past year? 1 1 0 0 0  Number falls in past yr: 0 0 0 0 0  Injury with Fall? 1 0 0 0 0  Risk for fall due to : - - Impaired balance/gait - -  Follow up Falls evaluation completed Falls evaluation completed Falls evaluation completed - -    FALL RISK PREVENTION PERTAINING TO THE HOME:  Any stairs in or around the home? Yes  If so, are there any without handrails? Yes  Home free of loose throw rugs in walkways, pet beds, electrical cords, etc? Yes  Adequate lighting in your home to reduce risk of falls? Yes   ASSISTIVE DEVICES UTILIZED TO PREVENT FALLS:  Life alert? No  Use of a cane, walker or w/c? No  Grab bars in the bathroom? Yes  Shower chair or bench in shower? Yes  Elevated toilet seat  or a handicapped toilet? No    TIMED UP AND GO:  Was the test performed? No .  Length of time to ambulate 10 feet:  sec.   Gait steady and fast with assistive device  Cognitive Function:        Immunizations Immunization History  Administered Date(s) Administered   Influenza, High Dose Seasonal PF 06/16/2016   Influenza,inj,quad, With Preservative 05/24/2019   Influenza-Unspecified 06/29/2021   PFIZER(Purple Top)SARS-COV-2 Vaccination 11/01/2019, 11/30/2019   Pfizer Covid-19 Vaccine Bivalent Booster 5yrs & up 06/21/2021   Pneumococcal Polysaccharide-23 08/07/2021   Zoster Recombinat (Shingrix) 11/24/2017, 04/02/2018     Flu Vaccine status: Up to date  Pneumococcal vaccine status: Up to date  Covid-19 vaccine status: Completed vaccines  Qualifies for Shingles Vaccine? No   Zostavax completed  unknown   Shingrix Completed?: Yes  Screening Tests Health Maintenance  Topic Date Due   URINE MICROALBUMIN  Never done   TETANUS/TDAP  Never done   COLONOSCOPY (Pts 45-47yrs Insurance coverage will need to be confirmed)  Never done   COVID-19 Vaccine (4 - Booster for Pfizer series) 08/16/2021   Pneumonia Vaccine 26+ Years old (2 - PCV) 08/07/2022   INFLUENZA VACCINE  Completed   Zoster Vaccines- Shingrix  Completed   HPV VACCINES  Aged Out   Hepatitis C Screening  Discontinued    Health Maintenance  Health Maintenance Due  Topic Date Due   URINE MICROALBUMIN  Never done   TETANUS/TDAP  Never done   COLONOSCOPY (Pts 45-45yrs Insurance coverage will need to be confirmed)  Never done    Colorectal cancer screening: No longer required.   Lung Cancer Screening: (Low Dose CT Chest recommended if Age 63-80 years, 30 pack-year currently smoking OR have quit w/in 15years.) does qualify.   Lung Cancer Screening Referral: no  Additional Screening:  Hepatitis C Screening: does not qualify; Completed: declined  Vision Screening: Recommended annual ophthalmology exams for early detection of glaucoma  and other disorders of the eye. Is the patient up to date with their annual eye exam?  Yes  Who is the provider or what is the name of the office in which the patient attends annual eye exams? [facility] If pt is not established with a provider, would they like to be referred to a provider to establish care? No .   Dental Screening: Recommended annual dental exams for proper oral hygiene  Community Resource Referral / Chronic Care Management: CRR required this visit?  No   CCM required this visit?  No      Plan:    Encounter for Medicare annual wellness exam  I have personally reviewed and noted the following in the patient's chart:   Medical and social history Use of alcohol, tobacco or illicit drugs  Current medications and supplements including opioid prescriptions. Patient is not currently taking opioid prescriptions. Functional ability and status Nutritional status Physical activity Advanced directives List of other physicians Hospitalizations, surgeries, and ER visits in previous 12 months Vitals Screenings to include cognitive, depression, and falls Referrals and appointments  In addition, I have reviewed and discussed with patient certain preventive protocols, quality metrics, and best practice recommendations. A written personalized care plan for preventive services as well as general preventive health recommendations were provided to patient.   I, Rip Harbour, NP, have reviewed all documentation for this visit. The documentation on 08/10/21 for the exam, diagnosis, procedures, and orders are all accurate and complete.   Jerrell Belfast, DNP   08/10/2021  Nurse Notes:

## 2021-08-10 NOTE — Patient Instructions (Signed)
    Safety In The Home  Use a variety of textures, such as Velcro, rubber bands and raised dots to provide tactile clues.  Apply to the on/off controls on appliances, at the end of the banister, or on medicine bottles.  Flooring  The following suggestions can help reduce the risk of a fall: Repair or replace torn carpet because a foot, cane or walker can easily get caught. Remove area carpets or throw rugs, especially if your loved one has a shuffling gait or uses a walker. When rugs  Or carpeting cannot be eliminated, place non-skid padding under rugs or secure to floor with double sided tape.  Area carpets without padding or tape can easily buckle underneath when walked on, causing a person to slip or fall. Use only matte, non-shiny finishes on the floor. Doorsills can be tripping hazards.  Remove them whenever possible or paint them a contrasting color. Eliminate low furniture that is easy to trip over such as coffee tables and footstools. Move furniture against walls to create a large area of uncluttered space in the center of the room.  However, if you are rearranging a room for someone else, discuss beforehand with that person.  Many individuals rely on specific locations of furniture to find their way around a room. It is easier to see the sofa or chair when its color contrasts with that of the flooring.  Choose a fabric that contrasts with the floor material or use a bright colored piping along the edges of the seat cushion. Reduce glare on polished furniture by covering it with a large doily or tablecloth.

## 2021-08-13 ENCOUNTER — Telehealth: Payer: Self-pay

## 2021-08-13 ENCOUNTER — Other Ambulatory Visit: Payer: Self-pay | Admitting: Family Medicine

## 2021-08-13 MED ORDER — TESTOSTERONE CYPIONATE 200 MG/ML IM SOLN
200.0000 mg | Freq: Once | INTRAMUSCULAR | 0 refills | Status: DC
Start: 2021-08-13 — End: 2022-01-14

## 2021-08-13 NOTE — Telephone Encounter (Signed)
The diagnosis I used was BPH with urinary symptoms which is the appropriate code for tadalafil (cialis.) They may still require a PA  His testosterone level was confusing. One was a little low and the other was normal. Recommend try testosterone shots every 2 weeks.  Rx sent. Insurance will not pay for testosterone.

## 2021-08-13 NOTE — Telephone Encounter (Signed)
Patient calling with two concerns. He is needing PA for tadalafil. Due to dx code insurance is unwilling to pay, they are needing explanation. Phone #: [phone].  Secondly on the last lab work (12/06) his free testosterone was low. States it was mentioned if testosterone was low injections may be started. He is questioning if this is needed. He is going to reach out to insurance to see what would be covered.

## 2021-08-13 NOTE — Telephone Encounter (Signed)
Patient notified.  Testosterone has been sent to pharmacy.

## 2021-08-15 ENCOUNTER — Ambulatory Visit (INDEPENDENT_AMBULATORY_CARE_PROVIDER_SITE_OTHER): Payer: Medicare Other | Admitting: Cardiology

## 2021-08-15 ENCOUNTER — Other Ambulatory Visit: Payer: Self-pay

## 2021-08-15 ENCOUNTER — Encounter: Payer: Self-pay | Admitting: Cardiology

## 2021-08-15 VITALS — BP 146/86 | HR 72 | Ht 74.0 in | Wt 192.6 lb

## 2021-08-15 DIAGNOSIS — G20 Parkinson's disease: Secondary | ICD-10-CM | POA: Diagnosis not present

## 2021-08-15 DIAGNOSIS — Z953 Presence of xenogenic heart valve: Secondary | ICD-10-CM | POA: Diagnosis not present

## 2021-08-15 DIAGNOSIS — I5032 Chronic diastolic (congestive) heart failure: Secondary | ICD-10-CM | POA: Diagnosis not present

## 2021-08-15 DIAGNOSIS — Z8673 Personal history of transient ischemic attack (TIA), and cerebral infarction without residual deficits: Secondary | ICD-10-CM | POA: Diagnosis not present

## 2021-08-15 DIAGNOSIS — Q2112 Patent foramen ovale: Secondary | ICD-10-CM | POA: Diagnosis not present

## 2021-08-15 NOTE — Progress Notes (Signed)
Cardiology Office Note:    Date:  08/15/2021   ID:  Travis Reese, DOB 07/23/1946, MRN [id number]  PCP:  Rochel Brome, MD  Cardiologist:  Jenne Campus, MD    Referring MD: Janine Limbo, PA-C   Chief Complaint  Patient presents with   Follow-up    Has question    History of Present Illness:    Travis Reese is a 75 y.o. 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  That was done in August oriented 2019.  Valve use was 27 mm InspirEase valve.  Graft site was 30 mm.  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  After that transesophageal echo has been performed which showed no evidence of blood clotting in the atrial appendages. however he was find to have moderate sized PFO.  He also wear a long-term monitor which showed no evidence of atrial fibrillation.  He is coming today to my office to talk about those issues.  Overall recently he was diagnosed with Parkinson's on his current weak tired and exhausted.  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  But I suspect weakness and fatigue is probably related to Parkinson.  There was also some issue about potentially closing PFO.  However and 75 years old with history of smoking with history of atherosclerosis I think the key will be continuation of antiplatelet therapy as well as proper cholesterol management.  I still I will talk to our structural heart doctors to see if they would be willing to consider PFO closure for this clinical scenario.  Past Medical History:  Diagnosis Date   Allergic rhinosinusitis    Anxiety    Aortic valve sclerosis    Arthritis     knees, neck, shoulder   CHF (congestive heart failure) (HCC)    Cubital tunnel syndrome    DDD (degenerative disc disease), cervical    Depression    Elevated PSA    denies per patient   HTN (hypertension)    Hyperlipidemia    Hyperplasia of prostate with lower urinary tract symptoms (LUTS)    Hypertension    Mild dilation of ascending aorta (HCC)    Parkinson disease (HCC)    Prolapsed internal hemorrhoids, grade 3 04/19/2015   Rectal prolapse    Scoliosis of lumbosacral spine    Stroke ([place])    x2   Tremor    Vitamin D deficiency    Wears hearing aid    bilateral    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   BENTALL PROCEDURE N/A 05/01/2018   Procedure: BIOLOGICAL BENTALL PROCEDURE AORTIC ROOT REPLACEMENT WITH 30MM VALSALVA GRAFT & 27MM INSPIRIS VALVE.;  Surgeon: Rexene Alberts, MD;  Location: [place];  Service: Open Heart Surgery;  Laterality: N/A;   BUBBLE STUDY  04/13/2021   Procedure: BUBBLE STUDY;  Surgeon: Buford Dresser, MD;  [facility] ENDOSCOPY;  Service: Cardiovascular;;   CATARACT EXTRACTION Bilateral 04/2018   COLONOSCOPY  2007   NASAL SINUS SURGERY  2006   RIGHT/LEFT HEART CATH AND CORONARY ANGIOGRAPHY N/A 04/27/2018   Procedure: RIGHT/LEFT HEART CATH AND CORONARY ANGIOGRAPHY;  Surgeon: Glori Bickers  R, MD;  Location: [place] CV LAB;  Service: Cardiovascular;  Laterality: N/A;   TEE WITHOUT CARDIOVERSION N/A 04/27/2018   Procedure: TRANSESOPHAGEAL ECHOCARDIOGRAM (TEE);  Surgeon: Jolaine Artist, MD;  Location: [facility] ENDOSCOPY;  Service: Cardiovascular;  Laterality: N/A;   TEE WITHOUT CARDIOVERSION N/A 05/01/2018   Procedure: TRANSESOPHAGEAL ECHOCARDIOGRAM (TEE);  Surgeon: Rexene Alberts, MD;  Location: [place];  Service: Open Heart Surgery;  Laterality: N/A;   TEE WITHOUT CARDIOVERSION N/A 04/13/2021   Procedure: TRANSESOPHAGEAL ECHOCARDIOGRAM (TEE);  Surgeon: Buford Dresser, MD;   Location: [place];  Service: Cardiovascular;  Laterality: N/A;   TRANSANAL HEMORRHOIDAL DEARTERIALIZATION N/A 11/15/2015   Procedure: TRANSANAL HEMORRHOIDAL DEARTERIALIZATION;  Surgeon: Leighton Ruff, MD;  Location: [place];  Service: General;  Laterality: N/A;    Current Medications: Current Meds  Medication Sig   acetaminophen (TYLENOL) 325 MG tablet Take 2 tablets (650 mg total) by mouth every 6 (six) hours as needed for mild pain (or Fever >/= 101).   albuterol (VENTOLIN HFA) 108 (90 Base) MCG/ACT inhaler Inhale 2 puffs into the lungs every 6 (six) hours as needed for wheezing or shortness of breath.   aspirin EC 325 MG EC tablet Take 1 tablet (325 mg total) by mouth daily.   carbidopa-levodopa (SINEMET IR) 25-100 MG tablet Take 1.5 tablets by mouth 3 (three) times daily. 9am/1pm/5pm   carvedilol (COREG) 25 MG tablet TAKE 1 TABLET BY MOUTH  TWICE DAILY WITH A MEAL (Patient taking differently: Take 25 mg by mouth 2 (two) times daily with a meal.)   CEPHALEXIN PO Take by mouth.   clonazePAM (KLONOPIN) 1 MG tablet One at night and one daily prn anxiety (Patient taking differently: Take 1 mg by mouth daily as needed for anxiety. One at night and one daily prn anxiety)   Cyanocobalamin 1000 MCG SUBL Place 1,000 mcg under the tongue daily.   diclofenac (VOLTAREN) 75 MG EC tablet Take 75 mg by mouth 2 (two) times daily.   DULoxetine (CYMBALTA) 60 MG capsule Take 1 capsule (60 mg total) by mouth 2 (two) times daily.   fluticasone (FLONASE) 50 MCG/ACT nasal spray Place 1 spray into both nostrils 3 times/day as needed-between meals & bedtime.   Melatonin 10 MG CAPS Take 5 mg by mouth at bedtime.   montelukast (SINGULAIR) 10 MG tablet Take 10 mg by mouth at bedtime.   nitrofurantoin, macrocrystal-monohydrate, (MACROBID) 100 MG capsule Take 1 capsule (100 mg total) by mouth 2 (two) times daily.   primidone (MYSOLINE) 50 MG tablet Take 1 tablet (50 mg total) by mouth at bedtime.    spironolactone (ALDACTONE) 25 MG tablet Take 0.5 tablets (12.5 mg total) by mouth daily.   tadalafil (CIALIS) 5 MG tablet Take 1 tablet (5 mg total) by mouth daily.   tamsulosin (FLOMAX) 0.4 MG CAPS capsule Take 1 capsule (0.4 mg total) by mouth 2 (two) times daily after a meal.   testosterone cypionate (DEPOTESTOSTERONE CYPIONATE) 200 MG/ML injection Inject 1 mL (200 mg total) into the muscle once for 1 dose.   Vitamin D, Ergocalciferol, (DRISDOL) 1.25 MG (50000 UNIT) CAPS capsule Take 1 capsule (50,000 Units total) by mouth every Monday.   Zinc 50 MG TABS Take 50 mg by mouth at bedtime.     Allergies:   Patient has no known allergies.   Social History   Socioeconomic History   Marital status: Single    Spouse name: Not on file   Number of children: 2   Years of  education: Not on file   Highest education level: Not on file  Occupational History   Occupation: retired    Comment: security guard  Tobacco Use   Smoking status: Some Days    Packs/day: 0.10    Years: 50.00    Pack years: 5.00    Types: Cigarettes    Last attempt to quit: 05/2018    Years since quitting: 3.2   Smokeless tobacco: Never  Vaping Use   Vaping Use: Former  Substance and Sexual Activity   Alcohol use: Not Currently    Comment: Occasional   Drug use: No   Sexual activity: Not Currently  Other Topics Concern   Not on file  Social History Narrative   Divorced lives with father after incarceration x 5 yrs   1 son, 1 daughter   Psychologist, occupational work   3 caffeine/day   04/19/2015   Right handed   Social Determinants of Health   Financial Resource Strain: Low Risk    Difficulty of Paying Living Expenses: Not hard at all  Food Insecurity: No Food Insecurity   Worried About Charity fundraiser in the Last Year: Never true   [place] in the Last Year: Never true  Transportation Needs: No Transportation Needs   Lack of Transportation (Medical): No   Lack of Transportation (Non-Medical): No   Physical Activity: Sufficiently Active   Days of Exercise per Week: 7 days   Minutes of Exercise per Session: 60 min  Stress: No Stress Concern Present   Feeling of Stress : Not at all  Social Connections: Moderately Isolated   Frequency of Communication with Friends and Family: More than three times a week   Frequency of Social Gatherings with Friends and Family: Three times a week   Attends Religious Services: Never   Active Member of Clubs or Organizations: No   Attends Archivist Meetings: Never   Marital Status: Living with partner     Family History: The patient's family history includes Breast cancer in his sister; COPD in his mother; Colon polyps in his father; Heart attack in his mother; Hyperlipidemia in his father; Hypertension in his brother and sister; Parkinson's disease in his father; Prostate cancer in his father and paternal uncle. ROS:   Please see the history of present illness.    All 14 point review of systems negative except as described per history of present illness  EKGs/Labs/Other Studies Reviewed:      Recent Labs: 01/22/2021: Magnesium 1.8 04/19/2021: TSH 1.240 06/21/2021: ALT 2; BUN 16; Creatinine, Ser 0.74; Hemoglobin 12.7; Platelets 240; Potassium 4.8; Sodium 138  Recent Lipid Panel    Component Value Date/Time   CHOL 149 04/19/2021 1006   TRIG 66 04/19/2021 1006   HDL 52 04/19/2021 1006   CHOLHDL 2.9 04/19/2021 1006   CHOLHDL 2.2 04/29/2018 0500   VLDL 6 04/29/2018 0500   LDLCALC 84 04/19/2021 1006    Physical Exam:    VS:  BP (!) 146/86 (BP Location: Left Arm, Patient Position: Sitting)    Pulse 72    Ht 6\' 2"  (1.88 m)    Wt 192 lb 9.6 oz (87.4 kg)    SpO2 94%    BMI 24.73 kg/m     Wt Readings from Last 3 Encounters:  08/15/21 192 lb 9.6 oz (87.4 kg)  08/10/21 190 lb (86.2 kg)  08/07/21 193 lb (87.5 kg)     GEN:  Well nourished, well developed in no acute distress HEENT:  Normal NECK: No JVD; No carotid bruits LYMPHATICS:  No lymphadenopathy CARDIAC: RRR, no murmurs, no rubs, no gallops RESPIRATORY:  Clear to auscultation without rales, wheezing or rhonchi  ABDOMEN: Soft, non-tender, non-distended MUSCULOSKELETAL:  No edema; No deformity  SKIN: Warm and dry LOWER EXTREMITIES: no swelling NEUROLOGIC:  Alert and oriented x 3 PSYCHIATRIC:  Normal affect   ASSESSMENT:    1. S/P aortic valve replacement with bioprosthetic valve   2. History of CVA (cerebrovascular accident)   3. Parkinson's disease ([place])   4. Chronic diastolic CHF (congestive heart failure) ([place])   5. PFO (patent foramen ovale)    PLAN:    In order of problems listed above:  Status post aortic valve replacement bioprosthesis.  Functioning normally continue present management. History of CVA with no clear-cut etiology so far.  He does have PFO however I do not think he is a candidate for closure of this finding because of other potential explanation for his CVA.  Still I will communicate to our structural disease doctors to see what they think about this situation.  So far we did investigate potential atrial fibrillation with long-term monitor but so far unrevealing. Chronic diastolic congestive heart failure.  Now stable.  Continue present management. Parkinson's disease which is a new diagnosis.   Medication Adjustments/Labs and Tests Ordered: Current medicines are reviewed at length with the patient today.  Concerns regarding medicines are outlined above.  No orders of the defined types were placed in this encounter.  Medication changes: No orders of the defined types were placed in this encounter.   Signed, Park Liter, MD, [facility] 08/15/2021 4:23 PM    [place]

## 2021-08-15 NOTE — Patient Instructions (Signed)

## 2021-08-19 MED ORDER — CLONAZEPAM 1 MG PO TABS: 1.0000 mg | ORAL_TABLET | Freq: Every day | ORAL | 0 refills | Status: DC | PRN

## 2021-08-19 MED ORDER — CARVEDILOL 25 MG PO TABS: 25.0000 mg | ORAL_TABLET | Freq: Two times a day (BID) | ORAL | 0 refills | Status: DC

## 2021-08-19 NOTE — Assessment & Plan Note (Signed)
Worsening.  The current medical regimen is effective;  continue present plan and medications.

## 2021-08-19 NOTE — Assessment & Plan Note (Addendum)
The current medical regimen is effective;  continue present plan and medications.  

## 2021-08-19 NOTE — Assessment & Plan Note (Signed)
The current medical regimen is effective;  continue present plan and medications.  

## 2021-08-23 DIAGNOSIS — S72002A Fracture of unspecified part of neck of left femur, initial encounter for closed fracture: Secondary | ICD-10-CM | POA: Diagnosis not present

## 2021-09-04 ENCOUNTER — Other Ambulatory Visit: Payer: Self-pay

## 2021-09-04 MED ORDER — MONTELUKAST SODIUM 10 MG PO TABS
10.0000 mg | ORAL_TABLET | Freq: Every day | ORAL | 0 refills | Status: DC
Start: 2021-09-04 — End: 2021-10-01

## 2021-09-04 MED ORDER — TAMSULOSIN HCL 0.4 MG PO CAPS: 0.4000 mg | ORAL_CAPSULE | Freq: Two times a day (BID) | ORAL | 0 refills | Status: DC

## 2021-09-04 MED ORDER — SPIRONOLACTONE 25 MG PO TABS: 12.5000 mg | ORAL_TABLET | Freq: Every day | ORAL | 0 refills | Status: DC

## 2021-09-05 ENCOUNTER — Other Ambulatory Visit: Payer: Self-pay

## 2021-09-05 MED ORDER — ALBUTEROL SULFATE HFA 108 (90 BASE) MCG/ACT IN AERS: 2.0000 | INHALATION_SPRAY | Freq: Four times a day (QID) | RESPIRATORY_TRACT | 1 refills | Status: DC | PRN

## 2021-09-05 MED ORDER — FLUTICASONE PROPIONATE 50 MCG/ACT NA SUSP: 1.0000 | Freq: Two times a day (BID) | NASAL | 1 refills | Status: DC | PRN

## 2021-09-07 ENCOUNTER — Other Ambulatory Visit: Payer: Self-pay

## 2021-09-07 MED ORDER — ALBUTEROL SULFATE HFA 108 (90 BASE) MCG/ACT IN AERS: 2.0000 | INHALATION_SPRAY | Freq: Four times a day (QID) | RESPIRATORY_TRACT | 2 refills | Status: DC | PRN

## 2021-09-10 NOTE — Progress Notes (Signed)
Subjective:  Patient ID: Travis Reese, male    DOB: 03/25/1946  Age: 76 y.o. MRN: [id number]  Chief Complaint  Patient presents with   Depression   Hypertension   Congestive Heart Failure   HPI: Patient is a 76 year old white male with past medical history of hypertension with CHF, B12 deficiency, Parkinson's disease, depression hypogonadism, and BPH who presents for chronic follow-up.  I did see the patient approximately 1 month ago for his depression and increase his Cymbalta for his depression.  This has helped.  Patient denies significant depression.   Patient's Parkinson's disease is stable.  Patient sees Dr. Carles Collet with neurology.  Currently on Sinemet 25/100 mg 1.5 tablets 3 times daily. 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 Agustin Cree.  Patient is currently on carvedilol 25 mg twice daily and spironolactone 25 mg half pill daily.  Denies chest pain or shortness of breath. B12 deficiency: Currently taking B12 1000 mcg sublingual once daily. Hyperlipidemia/Aortic atherosclerosis: aspirin 325 mg daily. Not on statin.   Current Outpatient Medications on File Prior to Visit  Medication Sig Dispense Refill   albuterol (PROVENTIL HFA) 108 (90 Base) MCG/ACT inhaler Inhale 1-2 puffs into the lungs every 6 (six) hours as needed for wheezing or shortness of breath.     acetaminophen (TYLENOL) 325 MG tablet Take 2 tablets (650 mg total) by mouth every 6 (six) hours as needed for mild pain (or Fever >/= 101).     aspirin EC 325 MG EC tablet Take 1 tablet (325 mg total) by mouth daily. 30 tablet 0   carbidopa-levodopa (SINEMET IR) 25-100 MG tablet Take 1.5 tablets by mouth 3 (three) times daily. 9am/1pm/5pm 405 tablet 1   carvedilol (COREG) 25 MG tablet Take 1 tablet (25 mg total) by mouth 2 (two) times daily with a meal. 1 tablet 0   CEPHALEXIN PO Take by mouth.     clonazePAM (KLONOPIN) 1 MG tablet Take 1 tablet (1 mg total) by mouth daily as  needed for anxiety. One at night and one daily prn anxiety 1 tablet 0   Cyanocobalamin 1000 MCG SUBL Place 1,000 mcg under the tongue daily.     diclofenac (VOLTAREN) 75 MG EC tablet Take 75 mg by mouth 2 (two) times daily.     DULoxetine (CYMBALTA) 60 MG capsule Take 1 capsule (60 mg total) by mouth 2 (two) times daily. 180 capsule 1   fluticasone (FLONASE) 50 MCG/ACT nasal spray Place 1 spray into both nostrils 3 times/day as needed-between meals & bedtime. 16 g 1   Melatonin 10 MG CAPS Take 5 mg by mouth at bedtime.     montelukast (SINGULAIR) 10 MG tablet Take 1 tablet (10 mg total) by mouth at bedtime. 90 tablet 0   primidone (MYSOLINE) 50 MG tablet Take 1 tablet (50 mg total) by mouth at bedtime. 90 tablet 1   spironolactone (ALDACTONE) 25 MG tablet Take 0.5 tablets (12.5 mg total) by mouth daily. 90 tablet 0   tadalafil (CIALIS) 5 MG tablet Take 1 tablet (5 mg total) by mouth daily. 90 tablet 3   tamsulosin (FLOMAX) 0.4 MG CAPS capsule Take 1 capsule (0.4 mg total) by mouth 2 (two) times daily after a meal. 180 capsule 0   testosterone cypionate (DEPOTESTOSTERONE CYPIONATE) 200 MG/ML injection Inject 1 mL (200 mg total) into the muscle once for 1 dose. 6 mL 0   Vitamin D, Ergocalciferol, (DRISDOL) 1.25 MG (50000 UNIT) CAPS capsule Take 1 capsule (  50,000 Units total) by mouth every Monday. 12 capsule 2   Zinc 50 MG TABS Take 50 mg by mouth at bedtime.     No current facility-administered medications on file prior to visit.   Past Medical History:  Diagnosis Date   Allergic rhinosinusitis    Anxiety    Aortic valve sclerosis    Arthritis    knees, neck, shoulder   CHF (congestive heart failure) (HCC)    Cubital tunnel syndrome    DDD (degenerative disc disease), cervical    Depression    Elevated PSA    denies per patient   HTN (hypertension)    Hyperlipidemia    Hyperplasia of prostate with lower urinary tract symptoms (LUTS)    Hypertension    Mild dilation of ascending aorta  (HCC)    Parkinson disease (HCC)    Prolapsed internal hemorrhoids, grade 3 04/19/2015   Rectal prolapse    Scoliosis of lumbosacral spine    Stroke ([place])    x2   Tremor    Vitamin D deficiency    Wears hearing aid    bilateral   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   BENTALL PROCEDURE N/A 05/01/2018   Procedure: BIOLOGICAL BENTALL PROCEDURE AORTIC ROOT REPLACEMENT WITH 30MM VALSALVA GRAFT & 27MM INSPIRIS VALVE.;  Surgeon: Rexene Alberts, MD;  Location: [place];  Service: Open Heart Surgery;  Laterality: N/A;   BUBBLE STUDY  04/13/2021   Procedure: BUBBLE STUDY;  Surgeon: Buford Dresser, MD;  Location: [facility] ENDOSCOPY;  Service: Cardiovascular;;   CATARACT EXTRACTION Bilateral 04/2018   COLONOSCOPY  2007   NASAL SINUS SURGERY  2006   RIGHT/LEFT HEART CATH AND CORONARY ANGIOGRAPHY N/A 04/27/2018   Procedure: RIGHT/LEFT HEART CATH AND CORONARY ANGIOGRAPHY;  Surgeon: Jolaine Artist, MD;  Location: [place] CV LAB;  Service: Cardiovascular;  Laterality: N/A;   TEE WITHOUT CARDIOVERSION N/A 04/27/2018   Procedure: TRANSESOPHAGEAL ECHOCARDIOGRAM (TEE);  Surgeon: Jolaine Artist, MD;  Location: [facility] ENDOSCOPY;  Service: Cardiovascular;  Laterality: N/A;   TEE WITHOUT CARDIOVERSION N/A 05/01/2018   Procedure: TRANSESOPHAGEAL ECHOCARDIOGRAM (TEE);  Surgeon: Rexene Alberts, MD;  Location: [place];  Service: Open Heart Surgery;  Laterality: N/A;   TEE WITHOUT CARDIOVERSION N/A 04/13/2021   Procedure: TRANSESOPHAGEAL ECHOCARDIOGRAM (TEE);  Surgeon: Buford Dresser, MD;  Location: [facility] ENDOSCOPY;  Service: Cardiovascular;  Laterality: N/A;   TRANSANAL HEMORRHOIDAL DEARTERIALIZATION N/A 11/15/2015   Procedure: TRANSANAL HEMORRHOIDAL DEARTERIALIZATION;  Surgeon: Leighton Ruff, MD;  Location: [place];  Service: General;  Laterality: N/A;    Family History  Problem Relation Age of Onset   COPD Mother    Heart  attack Mother    Colon polyps Father    Prostate cancer Father    Parkinson's disease Father    Hyperlipidemia Father    Hypertension Sister    Hypertension Brother    Breast cancer Sister    Prostate cancer Paternal Uncle    Social History   Socioeconomic History   Marital status: Single    Spouse name: Not on file   Number of children: 2   Years of education: Not on file   Highest education level: Not on file  Occupational History   Occupation: retired    Comment: security guard  Tobacco Use   Smoking status: Some Days    Packs/day: 0.10    Years: 50.00    Pack years: 5.00    Types: Cigarettes  Last attempt to quit: 05/2018    Years since quitting: 3.4   Smokeless tobacco: Never  Vaping Use   Vaping Use: Former  Substance and Sexual Activity   Alcohol use: Not Currently    Comment: Occasional   Drug use: No   Sexual activity: Not Currently  Other Topics Concern   Not on file  Social History Narrative   Divorced lives with father after incarceration x 5 yrs   1 son, 1 daughter   Volunteer work   3 caffeine/day   04/19/2015   Right handed   Social Determinants of Health   Financial Resource Strain: Low Risk    Difficulty of Paying Living Expenses: Not hard at all  Food Insecurity: No Food Insecurity   Worried About Charity fundraiser in the Last Year: Never true   [place] in the Last Year: Never true  Transportation Needs: No Transportation Needs   Lack of Transportation (Medical): No   Lack of Transportation (Non-Medical): No  Physical Activity: Sufficiently Active   Days of Exercise per Week: 7 days   Minutes of Exercise per Session: 60 min  Stress: No Stress Concern Present   Feeling of Stress : Not at all  Social Connections: Moderately Isolated   Frequency of Communication with Friends and Family: More than three times a week   Frequency of Social Gatherings with Friends and Family: Three times a week   Attends Religious Services: Never    Active Member of Clubs or Organizations: No   Attends Archivist Meetings: Never   Marital Status: Living with partner    Review of Systems  Constitutional:  Negative for appetite change, fatigue and fever.  HENT:  Positive for congestion. Negative for ear pain and sore throat.   Respiratory:  Negative for cough and shortness of breath.   Cardiovascular:  Negative for chest pain and leg swelling.  Gastrointestinal:  Negative for abdominal pain, constipation, diarrhea, nausea and vomiting.  Genitourinary:  Positive for frequency. Negative for dysuria.  Musculoskeletal:  Positive for arthralgias (right hip and bilateral knee pain) and back pain. Negative for myalgias.  Neurological:  Negative for dizziness and headaches.  Psychiatric/Behavioral:  Negative for dysphoric mood. The patient is not nervous/anxious.     Objective:  BP 130/68    Pulse 70    Temp 98.7 F (37.1 C)    Resp 16    Ht 6' (1.829 m)    Wt 193 lb 9.6 oz (87.8 kg)    BMI 26.26 kg/m   BP/Weight 09/11/2021 08/15/2021 89/10/1192  Systolic BP 174 081 448  Diastolic BP 68 86 76  Wt. (Lbs) 193.6 192.6 190  BMI 26.26 24.73 24.39    Physical Exam Vitals reviewed.  Constitutional:      Appearance: He is normal weight.     Comments: frail  Neck:     Vascular: No carotid bruit.  Cardiovascular:     Rate and Rhythm: Normal rate and regular rhythm.     Pulses: Normal pulses.     Heart sounds: Normal heart sounds.  Pulmonary:     Breath sounds: Normal breath sounds.  Abdominal:     General: Abdomen is flat. Bowel sounds are normal.     Palpations: Abdomen is soft.  Musculoskeletal:     Comments: Using a walker.   Neurological:     Mental Status: He is alert and oriented to person, place, and time.     Gait: Gait abnormal (shuffling.  slowed speech).  Psychiatric:        Mood and Affect: Mood normal.        Behavior: Behavior normal.    Diabetic Foot Exam - Simple   No data filed      Lab Results   Component Value Date   WBC 6.5 09/11/2021   HGB 13.3 09/11/2021   HCT 38.9 09/11/2021   PLT 173 09/11/2021   GLUCOSE 93 09/11/2021   CHOL 130 09/11/2021   TRIG 66 09/11/2021   HDL 43 09/11/2021   LDLCALC 73 09/11/2021   ALT 3 09/11/2021   AST 16 09/11/2021   NA 137 09/11/2021   K 5.0 09/11/2021   CL 101 09/11/2021   CREATININE 0.78 09/11/2021   BUN 13 09/11/2021   CO2 27 09/11/2021   TSH 1.240 04/19/2021   INR 1.58 05/01/2018      Assessment & Plan:   Problem List Items Addressed This Visit       Cardiovascular and Mediastinum   Hypertensive heart disease with chronic diastolic congestive heart failure ([place])    The current medical regimen is effective;  continue present plan and medications. Continue carvedilol 25 mg twice daily and spironolactone 25 mg half pill daily.  Management per specialist. Dr. Kirkland Hun.       Relevant Orders   CBC with Differential/Platelet (Completed)   Comprehensive metabolic panel (Completed)   Atherosclerosis of aorta (HCC)    aspirin daily. Recommend quit smoking        Nervous and Auditory   Parkinson's disease ([place]) - Primary    Defer management to Dr. Carles Collet        Musculoskeletal and Integument   Scoliosis of lumbosacral spine    Sees Dr. Dennard Nip with [facility].      Osteoarthritis of cervical and lumbar spine    Management per specialist. Dr. Maia Petties         Genitourinary   Acute cystitis without hematuria    Rx: macrobid Urine cx sent.       Relevant Orders   POCT urinalysis dipstick   Urine Culture (Completed)     Other   Mixed hyperlipidemia    Cholesterol at goal.  No medicines. Would recommend statin due to atherosclerosis.  Continue eating healthy.  Continue aspirin once daily with history of strokes.       Relevant Orders   Lipid panel (Completed)   History of cardioembolic cerebrovascular accident (CVA)    No sequelae      Vitamin D deficiency    The current medical  regimen is effective;  continue present plan and medications. Vitamin D 50 K weekly.       B12 deficiency    Continue B12 1000 mg SL daily.      Bipolar affective disorder, depressed, mild degree (HCC)    Continue cymbalta 60 mg one twice daily       S/P aortic valve replacement with bioprosthetic valve  .  Meds ordered this encounter  Medications   nitrofurantoin, macrocrystal-monohydrate, (MACROBID) 100 MG capsule    Sig: Take 1 capsule (100 mg total) by mouth 2 (two) times daily.    Dispense:  14 capsule    Refill:  0    Orders Placed This Encounter  Procedures   Urine Culture   CBC with Differential/Platelet   Comprehensive metabolic panel   Lipid panel   POCT urinalysis dipstick     Follow-up: Return in about 3 months (around 12/10/2021) for chronic fasting.  An After Visit Summary was printed and given to the patient.  Rochel Brome, MD [facility] [phone]

## 2021-09-11 ENCOUNTER — Other Ambulatory Visit: Payer: Self-pay

## 2021-09-11 ENCOUNTER — Ambulatory Visit (INDEPENDENT_AMBULATORY_CARE_PROVIDER_SITE_OTHER): Payer: Medicare Other | Admitting: Family Medicine

## 2021-09-11 VITALS — BP 130/68 | HR 70 | Temp 98.7°F | Resp 16 | Ht 72.0 in | Wt 193.6 lb

## 2021-09-11 DIAGNOSIS — I5032 Chronic diastolic (congestive) heart failure: Secondary | ICD-10-CM | POA: Diagnosis not present

## 2021-09-11 DIAGNOSIS — I110 Hypertensive heart disease with heart failure: Secondary | ICD-10-CM

## 2021-09-11 DIAGNOSIS — N3000 Acute cystitis without hematuria: Secondary | ICD-10-CM | POA: Diagnosis not present

## 2021-09-11 DIAGNOSIS — M47812 Spondylosis without myelopathy or radiculopathy, cervical region: Secondary | ICD-10-CM

## 2021-09-11 DIAGNOSIS — M4127 Other idiopathic scoliosis, lumbosacral region: Secondary | ICD-10-CM

## 2021-09-11 DIAGNOSIS — E538 Deficiency of other specified B group vitamins: Secondary | ICD-10-CM | POA: Diagnosis not present

## 2021-09-11 DIAGNOSIS — I700 Atherosclerosis of aorta: Secondary | ICD-10-CM | POA: Diagnosis not present

## 2021-09-11 DIAGNOSIS — Z953 Presence of xenogenic heart valve: Secondary | ICD-10-CM | POA: Diagnosis not present

## 2021-09-11 DIAGNOSIS — F3131 Bipolar disorder, current episode depressed, mild: Secondary | ICD-10-CM

## 2021-09-11 DIAGNOSIS — Z8673 Personal history of transient ischemic attack (TIA), and cerebral infarction without residual deficits: Secondary | ICD-10-CM | POA: Diagnosis not present

## 2021-09-11 DIAGNOSIS — E559 Vitamin D deficiency, unspecified: Secondary | ICD-10-CM

## 2021-09-11 DIAGNOSIS — R300 Dysuria: Secondary | ICD-10-CM

## 2021-09-11 DIAGNOSIS — M47816 Spondylosis without myelopathy or radiculopathy, lumbar region: Secondary | ICD-10-CM

## 2021-09-11 DIAGNOSIS — G20 Parkinson's disease: Secondary | ICD-10-CM | POA: Diagnosis not present

## 2021-09-11 DIAGNOSIS — E782 Mixed hyperlipidemia: Secondary | ICD-10-CM

## 2021-09-11 MED ORDER — NITROFURANTOIN MONOHYD MACRO 100 MG PO CAPS: 100.0000 mg | ORAL_CAPSULE | Freq: Two times a day (BID) | ORAL | 0 refills | Status: DC

## 2021-09-12 LAB — COMPREHENSIVE METABOLIC PANEL
ALT: 3 IU/L (ref 0–44)
AST: 16 IU/L (ref 0–40)
Albumin/Globulin Ratio: 1.7 (ref 1.2–2.2)
Albumin: 4 g/dL (ref 3.7–4.7)
Alkaline Phosphatase: 80 IU/L (ref 44–121)
BUN/Creatinine Ratio: 17 (ref 10–24)
BUN: 13 mg/dL (ref 8–27)
Bilirubin Total: 0.5 mg/dL (ref 0.0–1.2)
CO2: 27 mmol/L (ref 20–29)
Calcium: 9.4 mg/dL (ref 8.6–10.2)
Chloride: 101 mmol/L (ref 96–106)
Creatinine, Ser: 0.78 mg/dL (ref 0.76–1.27)
Globulin, Total: 2.4 g/dL (ref 1.5–4.5)
Glucose: 93 mg/dL (ref 70–99)
Potassium: 5 mmol/L (ref 3.5–5.2)
Sodium: 137 mmol/L (ref 134–144)
Total Protein: 6.4 g/dL (ref 6.0–8.5)
eGFR: 94 mL/min/{1.73_m2} (ref >59)

## 2021-09-12 LAB — CBC WITH DIFFERENTIAL/PLATELET
Basophils Absolute: 0 10*3/uL (ref 0.0–0.2)
Basos: 1 %
EOS (ABSOLUTE): 0.2 10*3/uL (ref 0.0–0.4)
Eos: 3 %
Hematocrit: 38.9 % (ref 37.5–51.0)
Hemoglobin: 13.3 g/dL (ref 13.0–17.7)
Immature Grans (Abs): 0 10*3/uL (ref 0.0–0.1)
Immature Granulocytes: 0 %
Lymphocytes Absolute: 1.4 10*3/uL (ref 0.7–3.1)
Lymphs: 22 %
MCH: 33.9 pg — ABNORMAL HIGH (ref 26.6–33.0)
MCHC: 34.2 g/dL (ref 31.5–35.7)
MCV: 99 fL — ABNORMAL HIGH (ref 79–97)
Monocytes Absolute: 0.5 10*3/uL (ref 0.1–0.9)
Monocytes: 8 %
Neutrophils Absolute: 4.4 10*3/uL (ref 1.4–7.0)
Neutrophils: 66 %
Platelets: 173 10*3/uL (ref 150–450)
RBC: 3.92 x10E6/uL — ABNORMAL LOW (ref 4.14–5.80)
RDW: 12.8 % (ref 11.6–15.4)
WBC: 6.5 10*3/uL (ref 3.4–10.8)

## 2021-09-12 LAB — LIPID PANEL
Chol/HDL Ratio: 3 ratio (ref 0.0–5.0)
Cholesterol, Total: 130 mg/dL (ref 100–199)
HDL: 43 mg/dL (ref >39)
LDL Chol Calc (NIH): 73 mg/dL (ref 0–99)
Triglycerides: 66 mg/dL (ref 0–149)
VLDL Cholesterol Cal: 14 mg/dL (ref 5–40)

## 2021-09-14 LAB — URINE CULTURE

## 2021-09-24 DIAGNOSIS — S72002A Fracture of unspecified part of neck of left femur, initial encounter for closed fracture: Secondary | ICD-10-CM | POA: Diagnosis not present

## 2021-09-26 ENCOUNTER — Ambulatory Visit: Payer: Medicare Other | Admitting: Sports Medicine

## 2021-09-29 ENCOUNTER — Other Ambulatory Visit: Payer: Self-pay | Admitting: Family Medicine

## 2021-09-29 ENCOUNTER — Encounter: Payer: Self-pay | Admitting: Family Medicine

## 2021-09-29 DIAGNOSIS — E538 Deficiency of other specified B group vitamins: Secondary | ICD-10-CM | POA: Insufficient documentation

## 2021-09-29 DIAGNOSIS — F3131 Bipolar disorder, current episode depressed, mild: Secondary | ICD-10-CM | POA: Insufficient documentation

## 2021-09-29 DIAGNOSIS — R300 Dysuria: Secondary | ICD-10-CM | POA: Insufficient documentation

## 2021-09-29 DIAGNOSIS — I700 Atherosclerosis of aorta: Secondary | ICD-10-CM

## 2021-09-29 DIAGNOSIS — M47816 Spondylosis without myelopathy or radiculopathy, lumbar region: Secondary | ICD-10-CM | POA: Insufficient documentation

## 2021-09-29 DIAGNOSIS — M47812 Spondylosis without myelopathy or radiculopathy, cervical region: Secondary | ICD-10-CM

## 2021-09-29 DIAGNOSIS — N3000 Acute cystitis without hematuria: Secondary | ICD-10-CM | POA: Insufficient documentation

## 2021-09-29 HISTORY — DX: Deficiency of other specified B group vitamins: E53.8

## 2021-09-29 HISTORY — DX: Atherosclerosis of aorta: I70.0

## 2021-09-29 HISTORY — DX: Spondylosis without myelopathy or radiculopathy, cervical region: M47.812

## 2021-09-29 LAB — POCT URINALYSIS DIPSTICK
Bilirubin, UA: NEGATIVE
Blood, UA: 0
Glucose, UA: NEGATIVE
Ketones, UA: NEGATIVE

## 2021-09-29 NOTE — Assessment & Plan Note (Signed)
Management per specialist. Dr. Maia Petties

## 2021-09-29 NOTE — Assessment & Plan Note (Addendum)
Cholesterol at goal.  No medicines. Would recommend statin due to atherosclerosis.  Continue eating healthy.  Continue aspirin once daily with history of strokes.

## 2021-09-29 NOTE — Assessment & Plan Note (Signed)
Continue B12 1000 mg SL daily.

## 2021-09-29 NOTE — Assessment & Plan Note (Addendum)
aspirin daily. Recommend quit smoking

## 2021-09-29 NOTE — Assessment & Plan Note (Signed)
Continue cymbalta 60 mg one twice daily

## 2021-09-29 NOTE — Assessment & Plan Note (Signed)
No sequelae

## 2021-09-29 NOTE — Assessment & Plan Note (Signed)
The current medical regimen is effective;  continue present plan and medications. Continue carvedilol 25 mg twice daily and spironolactone 25 mg half pill daily.  Management per specialist. Dr. Kirkland Hun.

## 2021-09-29 NOTE — Assessment & Plan Note (Addendum)
Sees Dr. Dennard Nip with [facility] Sc[facility] clinic.

## 2021-09-29 NOTE — Assessment & Plan Note (Signed)
Defer management to Dr. Carles Collet

## 2021-09-29 NOTE — Assessment & Plan Note (Addendum)
Rx: macrobid Urine cx sent.

## 2021-09-29 NOTE — Assessment & Plan Note (Signed)
The current medical regimen is effective;  continue present plan and medications. Vitamin D 50 K weekly.

## 2021-10-01 ENCOUNTER — Telehealth: Payer: Self-pay

## 2021-10-01 NOTE — Telephone Encounter (Signed)
Patient hesitant to try statin but willing to try. Will let PCP know. F/U 1 month to see response/ADR's  Patient confirmed ASA 325mg  dose prescribed by Cardio. Will reach out and double check with them per PCP

## 2021-10-03 ENCOUNTER — Telehealth: Payer: Self-pay

## 2021-10-03 NOTE — Chronic Care Management (AMB) (Signed)
Chronic Care Management Pharmacy Assistant   Name: Travis Reese  MRN: [id number] DOB: 1946/04/17   Reason for Encounter: General Adherence Call    Recent office visits:  09/11/21 Rochel Brome MD. Seen for Parkinson's Disease. Ordered Macrobid 100mg  for 7 days.  08/13/21 Rochel Brome MD. Orders Only. Ordered Testosterone Cypionate 200mg /ml.   08/10/21 Jerrell Belfast NP. Seen for Medicare Annual Wellness. No med changes.   08/07/21 Rochel Brome MD. Seen for Benign Prostatic Hyperplasia.   Recent consult visits:  08/15/21 (Cardiology) Jenne Campus MD. Seen for S/P Aortic Valve. No med changes. Changed Duloxetine HCI from 60mg  daily to 2 times daily. D/C Cialis 5mg  daily.  07/24/21 (Neurology) Tat, Wells Guiles DO. Seen for PFO. Increase carbidopa/levodopa 25/100 to 1.5 tablets three times per day.  Hospital visits:  None  Medications: Outpatient Encounter Medications as of 10/03/2021  Medication Sig   acetaminophen (TYLENOL) 325 MG tablet Take 2 tablets (650 mg total) by mouth every 6 (six) hours as needed for mild pain (or Fever >/= 101).   albuterol (PROVENTIL HFA) 108 (90 Base) MCG/ACT inhaler Inhale 1-2 puffs into the lungs every 6 (six) hours as needed for wheezing or shortness of breath.   aspirin EC 325 MG EC tablet Take 1 tablet (325 mg total) by mouth daily.   carbidopa-levodopa (SINEMET IR) 25-100 MG tablet Take 1.5 tablets by mouth 3 (three) times daily. 9am/1pm/5pm   carvedilol (COREG) 25 MG tablet Take 1 tablet (25 mg total) by mouth 2 (two) times daily with a meal.   CEPHALEXIN PO Take by mouth.   clonazePAM (KLONOPIN) 1 MG tablet Take 1 tablet (1 mg total) by mouth daily as needed for anxiety. One at night and one daily prn anxiety   Cyanocobalamin 1000 MCG SUBL Place 1,000 mcg under the tongue daily.   diclofenac (VOLTAREN) 75 MG EC tablet Take 75 mg by mouth 2 (two) times daily.   DULoxetine (CYMBALTA) 60 MG capsule Take 1 capsule (60 mg total) by mouth 2 (two)  times daily.   fluticasone (FLONASE) 50 MCG/ACT nasal spray Place 1 spray into both nostrils 3 times/day as needed-between meals & bedtime.   Melatonin 10 MG CAPS Take 5 mg by mouth at bedtime.   montelukast (SINGULAIR) 10 MG tablet TAKE 1 TABLET BY MOUTH AT  BEDTIME   nitrofurantoin, macrocrystal-monohydrate, (MACROBID) 100 MG capsule Take 1 capsule (100 mg total) by mouth 2 (two) times daily.   primidone (MYSOLINE) 50 MG tablet Take 1 tablet (50 mg total) by mouth at bedtime.   spironolactone (ALDACTONE) 25 MG tablet Take 0.5 tablets (12.5 mg total) by mouth daily.   tadalafil (CIALIS) 5 MG tablet Take 1 tablet (5 mg total) by mouth daily.   tamsulosin (FLOMAX) 0.4 MG CAPS capsule Take 1 capsule (0.4 mg total) by mouth 2 (two) times daily after a meal.   testosterone cypionate (DEPOTESTOSTERONE CYPIONATE) 200 MG/ML injection Inject 1 mL (200 mg total) into the muscle once for 1 dose.   Vitamin D, Ergocalciferol, (DRISDOL) 1.25 MG (50000 UNIT) CAPS capsule Take 1 capsule (50,000 Units total) by mouth every Monday.   Zinc 50 MG TABS Take 50 mg by mouth at bedtime.   No facility-administered encounter medications on file as of 10/03/2021.   [place] for general disease state and medication adherence call.   Patient is not > 5 days past due for refill on the following medications per chart history:  [facility] Medications: Medication Name/mg Last Fill Days Supply  None Noted   What concerns do you have about your medications? Pt denies any concerns  The patient denies side effects with his medications.   How often do you forget or accidentally miss a dose? Never  Do you use a pillbox? Yes  Are you having any problems getting your medications from your pharmacy? No  Has the cost of your medications been a concern? No  Since last visit with CPP, no interventions have been made:   The patient has not had an ED visit since last contact.   The patient denies problems with  their health.   he denies  concerns or questions for [place], at this time.    Care Gaps: Last annual wellness visit? 08/10/21 Colonoscopy: Postponed 08/10/22 Last eye exam / retinopathy screening? N/A Diabetic foot exam?N/A   Elray Mcgregor, [place] Pharmacist Assistant  [phone]

## 2021-10-10 ENCOUNTER — Telehealth: Payer: Self-pay

## 2021-10-10 NOTE — Telephone Encounter (Signed)
I left a message on the number(s) listed in the patients chart requesting the patient to call back regarding the [facility] appointment for 12/13/2021. The provider is out of the office that day. The appointment has been canceled. Waiting for the patient to return the call.

## 2021-10-18 ENCOUNTER — Telehealth: Payer: Self-pay | Admitting: Nurse Practitioner

## 2021-10-18 NOTE — Telephone Encounter (Signed)
Telephoned pt concerning Cialis 5 mg for BPH out of stock at [facility]. He is currently prescribed Tamsulosin 0.4 mg. He declines alternative medication at this time. States he will await [facility] to replenish stock of Cialis 5 mg.

## 2021-10-24 ENCOUNTER — Other Ambulatory Visit: Payer: Self-pay

## 2021-10-24 ENCOUNTER — Ambulatory Visit (INDEPENDENT_AMBULATORY_CARE_PROVIDER_SITE_OTHER): Payer: Medicare Other

## 2021-10-24 DIAGNOSIS — G20 Parkinson's disease: Secondary | ICD-10-CM

## 2021-10-24 DIAGNOSIS — E782 Mixed hyperlipidemia: Secondary | ICD-10-CM

## 2021-10-24 DIAGNOSIS — I5032 Chronic diastolic (congestive) heart failure: Secondary | ICD-10-CM

## 2021-10-24 DIAGNOSIS — I110 Hypertensive heart disease with heart failure: Secondary | ICD-10-CM

## 2021-10-24 NOTE — Progress Notes (Signed)
Chronic Care Management Pharmacy Note  10/24/2021 Name:  PILOT PRINDLE MRN:  [id number] DOB:  Mar 23, 1946  Summary:  Pleasant 76 year old male presents for F/U CCM visit. He retired as a Animal nutritionist. He likes playing card games on his computer along with some mind puzzles on the [place] website. His son, Travis Reese, passed in Sept 2022. He lived outside of [facility] so he still visits his DIL and 2 grandkids who still live there. Patient's sister lives in [place] so he'll visit her there as well too. Patient works out during [employer] while watching his favourite TV shows.  Recommendations/Changes made from today's visit: PHQ9 has gone up significantly and patient doesn't think Duloxetine is helping at all. Will ask PCP to change therapy or re-evaluate Patient agreed to try statin a month or so ago and I saw in your note you were thinking about trial, will ask PCP about Statin start Patient not able to afford Tadalafil, would like to try Alfuzosin for BPH, will ask PCP to send in Carb/Lev increased from 1TID->1.5TID, asked Neuro to decrease dose since patient is having worsening shaking post dose change Counseled patient on joining [employer] group.     Subjective: Travis Reese is an 76 y.o. year old male who is a primary patient of Cox, Kirsten, MD.  The CCM team was consulted for assistance with disease management and care coordination needs.    Engaged with patient by telephone for follow up visit in response to provider referral for pharmacy case management and/or care coordination services.   Consent to Services:  The patient was given the following information about Chronic Care Management services today, agreed to services, and gave verbal consent: 1. CCM service includes personalized support from designated clinical staff supervised by the primary care provider, including individualized plan of care and coordination with other care providers 2. 24/7  contact phone numbers for assistance for urgent and routine care needs. 3. Service will only be billed when office clinical staff spend 20 minutes or more in a month to coordinate care. 4. Only one practitioner may furnish and bill the service in a calendar month. 5.The patient may stop CCM services at any time (effective at the end of the month) by phone call to the office staff. 6. The patient will be responsible for cost sharing (co-pay) of up to 20% of the service fee (after annual deductible is met). Patient agreed to services and consent obtained.  Patient Care Team: Rochel Brome, MD as PCP - General (Internal Medicine) Tat, Eustace Quail, DO as Consulting Physician (Neurology) Park Liter, MD as Consulting Physician (Cardiology) Starling Manns, MD (Orthopedic Surgery) Lane Hacker, [facility] (Pharmacist)    Recent office visits:  04/10/21- Rochel Brome, MD- seen to establish care/ parkinson's increased duloxetine from 30 mg daily to 60 mg daily, labs ordered, follow up 2 weeks   Recent consult visits:  04/09/21- Cyndy Freeze, MD (Cardiology)-seen for follow-up status post  aortic valve replacement bioprosthetic aortic valve , labs ordered, ZIO patch ordered, no medication changes, follow up 3 months 03/21/21- Landis Martins, DPM (Podiatry)- seen for pain due to onychomycosis of toenails of both feet, debridement performed, no medication changes, follow up 3 months 02/23/21- Wells Guiles Tat, DO (Neurology Video Visit)- seen for parkinson's with tremor, started levodopa 25/100 with titration schedule, referral to cardiology, follow up as scheduled 01/23/21- Orders Only (Cardiology)- 30 Day Cardiac Event Monitor to evaluate CVA 01/18/21- Wells Guiles Tat, DO (  Neurology)- seen for parkinson's with tremor, echocardiogram ordered, [place] ordered, no medication changes, no follow up documented   Hospital visits:  Medication Reconciliation was completed by comparing discharge summary, patients EMR and  Pharmacy list, and upon discussion with patient.   Admitted to the hospital on 01/19/21 due to UTI. Discharge date was 01/24/21. Discharged from [place]?Medications Started at [facility] Discharge:?? cephALEXin 500 mg x 4 days cyanocobalamin 1000 mcg/ml every 30 days feeding supplement 237 ml tid between meals   Medications Discontinued at Hospital Discharge: tadalafil 5 MG    All other medications  remain the same after Hospital Discharge:??      2. 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 Discharge date was 12/20/20.Marland Kitchen Discharged from [place]?Medications Started at [facility] Discharge:?? Cyanocobalamin 1000 mcg/ ml daily x 15 days folic acid 1 mg daily   Medications Discontinued at Hospital Discharge: ARIPiprazole 10 MG due to induced parkinson's   All other medications remain the same after Hospital Discharge:??    Objective:  Lab Results  Component Value Date   CREATININE 0.78 09/11/2021   BUN 13 09/11/2021   GFRNONAA >60 01/22/2021   GFRAA >60 05/15/2018   NA 137 09/11/2021   K 5.0 09/11/2021   CALCIUM 9.4 09/11/2021   CO2 27 09/11/2021   GLUCOSE 93 09/11/2021    No results found for: HGBA1C, FRUCTOSAMINE, GFR, MICROALBUR  Last diabetic Eye exam: No results found for: HMDIABEYEEXA  Last diabetic Foot exam: No results found for: HMDIABFOOTEX   Lab Results  Component Value Date   CHOL 130 09/11/2021   HDL 43 09/11/2021   LDLCALC 73 09/11/2021   TRIG 66 09/11/2021   CHOLHDL 3.0 09/11/2021    Hepatic Function Latest Ref Rng & Units 09/11/2021 06/21/2021 04/19/2021  Total Protein 6.0 - 8.5 g/dL 6.4 6.9 6.7  Albumin 3.7 - 4.7 g/dL 4.0 3.9 4.3  AST 0 - 40 IU/L _0 ALT 0 - 44 IU/L _1 Alk Phosphatase 44 - 121 IU/L 80 86 77  Total Bilirubin 0.0 - 1.2 mg/dL 0.5 0.4 0.6    Lab Results  Component Value Date/Time   TSH 1.240 04/19/2021 10:06 AM   TSH 2.294 01/21/2021  12:34 AM   TSH 2.17 03/30/2020 10:13 AM    CBC Latest Ref Rng & Units 09/11/2021 06/21/2021 04/19/2021  WBC 3.4 - 10.8 x10E3/uL 6.5 6.5 6.1  Hemoglobin 13.0 - 17.7 g/dL 13.3 12.7(L) 13.7  Hematocrit 37.5 - 51.0 % 38.9 37.5 40.1  Platelets 150 - 450 x10E3/uL 173 240 193    Lab Results  Component Value Date/Time   VD25OH 53.9 04/19/2021 10:06 AM   VD25OH 70.03 12/16/2020 12:16 PM    Clinical ASCVD: Yes  The ASCVD Risk score (Arnett DK, et al., 2019) failed to calculate for the following reasons:   The patient has a prior MI or stroke diagnosis    Depression screen [facility] 2/9 10/24/2021 09/11/2021 08/07/2021  Decreased Interest 3 0 3  Down, Depressed, Hopeless 3 0 0  PHQ - 2 Score 6 0 3  Altered sleeping 0 0 0  Tired, decreased energy 1 0 3  Change in appetite 0 0 0  Feeling bad or failure about yourself  3 0 2  Trouble concentrating 2 0 0  Moving slowly or fidgety/restless 2 0 2  Suicidal thoughts 0 0 0  PHQ-9 Score 14 -  10  Difficult doing work/chores Very difficult Not difficult at all -     Other: (CHADS2VASc if Afib, MMRC or CAT for COPD, ACT, DEXA)  Social History   Tobacco Use  Smoking Status Some Days   Packs/day: 0.10   Years: 50.00   Pack years: 5.00   Types: Cigarettes   Last attempt to quit: 05/2018   Years since quitting: 3.4  Smokeless Tobacco Never   BP Readings from Last 3 Encounters:  09/11/21 130/68  08/15/21 (!) 146/86  08/10/21 136/76   Pulse Readings from Last 3 Encounters:  09/11/21 70  08/15/21 72  08/10/21 62   Wt Readings from Last 3 Encounters:  09/11/21 193 lb 9.6 oz (87.8 kg)  08/15/21 192 lb 9.6 oz (87.4 kg)  08/10/21 190 lb (86.2 kg)   BMI Readings from Last 3 Encounters:  09/11/21 26.26 kg/m  08/15/21 24.73 kg/m  08/10/21 24.39 kg/m    Assessment/Interventions: Review of patient past medical history, allergies, medications, health status, including review of consultants reports, laboratory and other test data, was performed  as part of comprehensive evaluation and provision of chronic care management services.   SDOH:  (Social Determinants of Health) assessments and interventions performed: Yes SDOH Interventions    Flowsheet Row Most Recent Value  SDOH Interventions   Financial Strain Interventions Intervention Not Indicated  Transportation Interventions Intervention Not Indicated  Depression Interventions/Treatment  Medication      SDOH Screenings   Alcohol Screen: Low Risk    Last Alcohol Screening Score (AUDIT): 0  Depression (PHQ2-9): Medium Risk   PHQ-2 Score: 14  Financial Resource Strain: Low Risk    Difficulty of Paying Living Expenses: Not hard at all  Food Insecurity: No Food Insecurity   Worried About Charity fundraiser in the Last Year: Never true   Ran Out of Food in the Last Year: Never true  Housing: Low Risk    Last Housing Risk Score: 0  Physical Activity: Sufficiently Active   Days of Exercise per Week: 7 days   Minutes of Exercise per Session: 60 min  Social Connections: Moderately Isolated   Frequency of Communication with Friends and Family: More than three times a week   Frequency of Social Gatherings with Friends and Family: Three times a week   Attends Religious Services: Never   Active Member of Clubs or Organizations: No   Attends Archivist Meetings: Never   Marital Status: Living with partner  Stress: No Stress Concern Present   Feeling of Stress : Not at all  Tobacco Use: High Risk   Smoking Tobacco Use: Some Days   Smokeless Tobacco Use: Never   Passive Exposure: Not on file  Transportation Needs: No Transportation Needs   Lack of Transportation (Medical): No   Lack of Transportation (Non-Medical): No    CCM Care Plan  No Known Allergies  Medications Reviewed Today     Reviewed by Lane Hacker, [facility] (Pharmacist) on 10/24/21 at 1237  Med List Status: <None>   Medication Order Taking? Sig Documenting Provider Last Dose Status Informant   acetaminophen (TYLENOL) 325 MG tablet [id number]  Take 2 tablets (650 mg total) by mouth every 6 (six) hours as needed for mild pain (or Fever >/= 101). Elgergawy, Silver Huguenin, MD  Active   albuterol (PROVENTIL HFA) 108 (90 Base) MCG/ACT inhaler [id number]  Inhale 1-2 puffs into the lungs every 6 (six) hours as needed for wheezing or shortness of breath. [provider]  Active  aspirin EC 325 MG EC tablet [id number]  Take 1 tablet (325 mg total) by mouth daily. Barrett, Lodema Hong, PA-C  Active Self  carbidopa-levodopa (SINEMET IR) 25-100 MG tablet [id number] Yes Take 1.5 tablets by mouth 3 (three) times daily. 9am/1pm/5pm Tat, Eustace Quail, DO Taking Active   carvedilol (COREG) 25 MG tablet [id number]  Take 1 tablet (25 mg total) by mouth 2 (two) times daily with a meal. Rochel Brome, MD  Active   CEPHALEXIN PO [id number] No Take by mouth.  Patient not taking: Reported on 10/24/2021   [provider] Not Taking Consider Medication Status and Discontinue   clonazePAM (KLONOPIN) 1 MG tablet [id number]  Take 1 tablet (1 mg total) by mouth daily as needed for anxiety. One at night and one daily prn anxiety Cox, Kirsten, MD  Active   Cyanocobalamin 1000 MCG SUBL [id number]  Place 1,000 mcg under the tongue daily. [provider]  Active   diclofenac (VOLTAREN) 75 MG EC tablet [id number]  Take 75 mg by mouth 2 (two) times daily. [provider]  Active   DULoxetine (CYMBALTA) 60 MG capsule [id number]  Take 1 capsule (60 mg total) by mouth 2 (two) times daily. Cox, Kirsten, MD  Active   fluticasone [facility]) 50 MCG/ACT nasal spray [id number]  Place 1 spray into both nostrils 3 times/day as needed-between meals & bedtime. Rochel Brome, MD  Active   Melatonin 10 MG CAPS [id number]  Take 5 mg by mouth at bedtime. [provider]  Active Self  montelukast (SINGULAIR) 10 MG tablet [id number]  TAKE 1 TABLET BY MOUTH AT  BEDTIME Marge Duncans, PA-C  Active   nitrofurantoin,  macrocrystal-monohydrate, (MACROBID) 100 MG capsule [id number]  Take 1 capsule (100 mg total) by mouth 2 (two) times daily. Cox, Kirsten, MD  Active   primidone (MYSOLINE) 50 MG tablet [id number]  Take 1 tablet (50 mg total) by mouth at bedtime. Cox, Kirsten, MD  Active   spironolactone (ALDACTONE) 25 MG tablet [id number]  Take 0.5 tablets (12.5 mg total) by mouth daily. Cox, Kirsten, MD  Active   tadalafil (CIALIS) 5 MG tablet [id number]  Take 1 tablet (5 mg total) by mouth daily. Rip Harbour, NP  Active   tamsulosin (FLOMAX) 0.4 MG CAPS capsule [id number]  Take 1 capsule (0.4 mg total) by mouth 2 (two) times daily after a meal. Cox, Kirsten, MD  Active   testosterone cypionate (DEPOTESTOSTERONE CYPIONATE) 200 MG/ML injection [id number]  Inject 1 mL (200 mg total) into the muscle once for 1 dose. Rochel Brome, MD  Expired 08/15/21 2359   Zinc 50 MG TABS [id number]  Take 50 mg by mouth at bedtime. [provider]  Active Self            Patient Active Problem List   Diagnosis Date Noted   B12 deficiency 09/29/2021   Bipolar affective disorder, depressed, mild degree ([place]) 09/29/2021   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   Atherosclerosis of aorta ([place]) 09/29/2021   Acute cystitis without hematuria 09/29/2021   Postoperative anemia 06/23/2021   S/p left hip fracture 06/23/2021   PFO (patent foramen ovale)    Vitamin D deficiency 04/02/2021   Scoliosis of lumbosacral spine 04/02/2021   Rectal prolapse 04/02/2021   Mild dilation of ascending aorta ([place]) 04/02/2021   DDD (degenerative disc disease), cervical 04/02/2021   Cubital tunnel syndrome 04/02/2021   Arthritis 04/02/2021   Aortic valve sclerosis 04/02/2021   Hypertensive heart disease with chronic diastolic congestive heart  failure ([place]) 04/02/2021   Parkinson's disease ([place]) 02/23/2021   Depression    History of cardioembolic cerebrovascular accident (CVA)    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    S/P aortic valve replacement with bioprosthetic valve 05/01/2018   Chronic diastolic CHF (congestive heart failure) ([place]) 04/19/2018   BPH (benign prostatic hyperplasia) 04/19/2018   Anxiety 04/19/2018   Mixed hyperlipidemia 09/23/2016    Immunization History  Administered Date(s) Administered   Influenza, High Dose Seasonal PF 06/16/2016   Influenza,inj,quad, With Preservative 05/24/2019   Influenza-Unspecified 06/29/2021   PFIZER(Purple Top)SARS-COV-2 Vaccination 11/01/2019, 11/30/2019   Pfizer Covid-19 Vaccine Bivalent Booster 31yr & up 06/21/2021   Pneumococcal Polysaccharide-23 08/07/2021   Zoster Recombinat (Shingrix) 11/24/2017, 04/02/2018    Conditions to be addressed/monitored:  Hypertension, Hyperlipidemia, Heart Failure, Anxiety, BPH, and Parkinson's disease  Care Plan : ccm pharmacy care plan  Updates made by KLane Hacker RGanttsince 10/24/2021 12:00 AM     Problem: anxiety, bp, hld, heart failure   Priority: High  Onset Date: 04/24/2021     Long-Range Goal: Disease State Managment   Start Date: 04/24/2021  Expected End Date: 04/24/2022  Recent Progress: On track  Priority: High  Note:   Current Barriers:  Unable to maintain control of cholesterol  Pharmacist Clinical Goal(s):  Patient will maintain control of cholesterol as evidenced by lipid panel  through collaboration with PharmD and provider.   Interventions: 1:1 collaboration with CRochel Brome MD regarding development and update of comprehensive plan of care as evidenced by provider attestation and co-signature Inter-disciplinary care team collaboration (see longitudinal plan of care) Comprehensive medication review performed; medication list updated in electronic medical record  Hypertension (BP goal <140/90) -Managed by Dr. KAgustin Cree Robert BP Readings from Last 3 Encounters:  09/11/21 130/68  08/15/21 (!) 146/86  08/10/21 136/76  -Controlled -Current treatment: Carvedilol 25 mg bid  with a meal Appropriate, Effective, Safe, Accessible Spironolactone 25 mg take 1/2 daily Appropriate, Effective, Safe, Accessible -Medications previously tried: furosemide, lisinopril-hydrochlorothiazide -Current home readings: not checking often -Current dietary habits: eats meats and vegetables per patient. Eating 3 meals daily.  -Current exercise habits: very limited. Encouraged him to consider boxing class or other exercise.  -Denies hypotensive/hypertensive symptoms -Educated on BP goals and benefits of medications for prevention of heart attack, stroke and kidney damage; Daily salt intake goal < 2300 mg; Exercise goal of 150 minutes per week; -Counseled to monitor BP at home as needed, document, and provide log at future appointments -Counseled on diet and exercise extensively Recommended to continue current medication  Hyperlipidemia: (LDL goal < 70) -Not ideally controlled -Current treatment: Diet/lifestyle -Medications previously tried: patient has never been on statin  -Current dietary habits: eats meats and vegetables per patient. Eating 3 meals daily.  -Current exercise habits: very limited. Encouraged him to consider boxing class or other exercise.  -Educated on Cholesterol goals;  Importance of limiting foods high in cholesterol; Exercise goal of 150 minutes per week; -Counseled on diet and exercise extensively Recommended considering statin to achieve ldl goal of <70.   Heart Failure (Goal: manage symptoms and prevent exacerbations) -Managed by Dr. KAgustin Cree RHerbie [place]-Controlled -Last ejection fraction: 55-60% (Date: 05/22) -Current treatment: carvedilol 25 mg bid with a meal Appropriate, Effective, Safe, Accessible Spironolactone 25 mg 1/2 tablet daily Appropriate, Effective, Safe, Accessible -Medications previously tried: furosemide, lisinopril-hydrochlorothiazide -Current home BP/HR readings: not checking currently  -Current dietary habits: eats meats and  vegetables per patient. Eating 3 meals daily.  -Current exercise habits:  very limited. Encouraged him to consider boxing class or other exercise.  -Educated on Benefits of medications for managing symptoms and prolonging life -Counseled on diet and exercise extensively Recommended to continue current medication  Arthritis (Goal: manage symptoms) -Controlled -Current treatment  acetaminophen 325 mg 2 tablets every 6 hours prn mild pain or fever - rarely takes Appropriate, Effective, Safe, Accessible -Medications previously tried:  voltaren, meloxicam, naproxen, oxycodone, tiaznidine, pregabalin -Patient is interested in stopping Celebrex and monitoring symptoms if Dr. Tobie Poet approves.   Depression/Anxiety (Goal: manage symptoms of anxiety) -Controlled -Current treatment: duloxetine 60 mg BID Appropriate, Query effective,  Clonazepam 1 mg daily at bedtime Appropriate, Effective, Safe, Accessible -Medications previously tried/failed: abilify, bupropion, seroquel -PHQ9:  Depression screen [facility] 2/9 10/24/2021 09/11/2021 08/07/2021  Decreased Interest 3 0 3  Down, Depressed, Hopeless 3 0 0  PHQ - 2 Score 6 0 3  Altered sleeping 0 0 0  Tired, decreased energy 1 0 3  Change in appetite 0 0 0  Feeling bad or failure about yourself  3 0 2  Trouble concentrating 2 0 0  Moving slowly or fidgety/restless 2 0 2  Suicidal thoughts 0 0 0  PHQ-9 Score 14 - 10  Difficult doing work/chores Very difficult Not difficult at all -  -GAD7:  GAD 7 : Generalized Anxiety Score 04/24/2021  Nervous, Anxious, on Edge 1  Control/stop worrying 1  Worry too much - different things 1  Trouble relaxing 1  Restless 0  Easily annoyed or irritable 0  Afraid - awful might happen 0  Total GAD 7 Score 4  -Educated on Benefits of medication for symptom control Feb 2023: PHQ9 is elevated and patient states he doesn't like Duloxetine, that it isn't helping. Will coordinate with PCP about changing therapy  Parkinson's  Disease (Goal: improve symptoms - managed by Dr Tat) -Not Ideally Controlled -Current treatment  Sinemet IR 25-100 mg 1.5 tablet tid (7a/11a/4p) Appropriate, Effective, Query Safe, Accessible Primidone 50 mg daily at bedtime Appropriate, Effective, Safe, Accessible -Medications previously tried: none reported Feb 2023; Patient shaking more after dose increase. Will ask PCP about changing dose. Also, spent time counseling patient about going to Support Groups, explained how [employer] helped my grandpa, patient seemed very interested in this.   BPH (Goal: manage symptoms) -Controlled -Night Time Awakenings: 1/night  -Most of patient's issues are in morning -Current treatment  tadalafil 5 mg daily - trial off of it for efficacy Query Appropriate, Query effective, Query Safe, Query accessible Tamsulosin 0.4 mg bid after a meal Appropriate, Query effective, Safe, Accessible -Medications previously tried:  terazosin Feb 2023: Patient unable to take Tadalafil due to cost. Patient read about Alfusosin and would like to try that. Also, spent extensive time explaining Kegel therapy   Patient Goals/Self-Care Activities Patient will:  - take medications as prescribed focus on medication adherence by using pill box  target a minimum of 150 minutes of moderate intensity exercise weekly  Follow Up Plan: Telephone follow up appointment with care management team member scheduled for: August 2023  [place] Constable, Sherian Rein.D. - [phone]        Medication Assistance: None required.  Patient affirms current coverage meets needs.  Compliance/Adherence/Medication fill history: Care Gaps: Last annual wellness visit? Upcoming 70/35/00 If applicable: N/A Last eye exam / retinopathy screening? Last diabetic foot exam?  Star-Rating Drugs: None noted  Patient's preferred pharmacy is:  [employer] ([place], [place] [address]  [place] [address] Phone: [phone] Fax: [place], [address] [place] [address] Phone: [phone] Fax: [phone]  [place], [address] [place]. [place] [address] Phone: [phone] Fax: [place] [facility] ([facility] ) - [place], [address] [place] [address] Phone: [phone] Fax: [place], [address] [place] [address] Phone: [phone] Fax: [phone]   Uses pill box? Yes Pt endorses good compliance  We discussed: Current pharmacy is preferred with insurance plan and patient is satisfied with pharmacy services Patient decided to: Continue current medication management strategy  Care Plan and Follow Up Patient Decision:  Patient agrees to Care Plan and Follow-up.  Plan: Telephone follow up appointment with care management team member scheduled for:  04/2022  [place] Constable, Pharm.D. - [phone]

## 2021-10-24 NOTE — Patient Instructions (Signed)
Visit Information   Goals Addressed   None    Patient Care Plan: ccm pharmacy care plan     Problem Identified: anxiety, bp, hld, heart failure   Priority: High  Onset Date: 04/24/2021     Long-Range Goal: Disease State Managment   Start Date: 04/24/2021  Expected End Date: 04/24/2022  Recent Progress: On track  Priority: High  Note:   Current Barriers:  Unable to maintain control of cholesterol  Pharmacist Clinical Goal(s):  Patient will maintain control of cholesterol as evidenced by lipid panel  through collaboration with PharmD and provider.   Interventions: 1:1 collaboration with Rochel Brome, MD regarding development and update of comprehensive plan of care as evidenced by provider attestation and co-signature Inter-disciplinary care team collaboration (see longitudinal plan of care) Comprehensive medication review performed; medication list updated in electronic medical record  Hypertension (BP goal <140/90) -Managed by Dr. Agustin Cree, [place] BP Readings from Last 3 Encounters:  09/11/21 130/68  08/15/21 (!) 146/86  08/10/21 136/76  -Controlled -Current treatment: Carvedilol 25 mg bid with a meal Appropriate, Effective, Safe, Accessible Spironolactone 25 mg take 1/2 daily Appropriate, Effective, Safe, Accessible -Medications previously tried: furosemide, lisinopril-hydrochlorothiazide -Current home readings: not checking often -Current dietary habits: eats meats and vegetables per patient. Eating 3 meals daily.  -Current exercise habits: very limited. Encouraged him to consider boxing class or other exercise.  -Denies hypotensive/hypertensive symptoms -Educated on BP goals and benefits of medications for prevention of heart attack, stroke and kidney damage; Daily salt intake goal < 2300 mg; Exercise goal of 150 minutes per week; -Counseled to monitor BP at home as needed, document, and provide log at future appointments -Counseled on diet and exercise  extensively Recommended to continue current medication  Hyperlipidemia: (LDL goal < 70) -Not ideally controlled -Current treatment: Diet/lifestyle -Medications previously tried: patient has never been on statin  -Current dietary habits: eats meats and vegetables per patient. Eating 3 meals daily.  -Current exercise habits: very limited. Encouraged him to consider boxing class or other exercise.  -Educated on Cholesterol goals;  Importance of limiting foods high in cholesterol; Exercise goal of 150 minutes per week; -Counseled on diet and exercise extensively Recommended considering statin to achieve ldl goal of <70.   Heart Failure (Goal: manage symptoms and prevent exacerbations) -Managed by Dr. Agustin Cree, Herbie [place] -Controlled -Last ejection fraction: 55-60% (Date: 05/22) -Current treatment: carvedilol 25 mg bid with a meal Appropriate, Effective, Safe, Accessible Spironolactone 25 mg 1/2 tablet daily Appropriate, Effective, Safe, Accessible -Medications previously tried: furosemide, lisinopril-hydrochlorothiazide -Current home BP/HR readings: not checking currently  -Current dietary habits: eats meats and vegetables per patient. Eating 3 meals daily.  -Current exercise habits: very limited. Encouraged him to consider boxing class or other exercise.  -Educated on Benefits of medications for managing symptoms and prolonging life -Counseled on diet and exercise extensively Recommended to continue current medication  Arthritis (Goal: manage symptoms) -Controlled -Current treatment  acetaminophen 325 mg 2 tablets every 6 hours prn mild pain or fever - rarely takes Appropriate, Effective, Safe, Accessible -Medications previously tried:  voltaren, meloxicam, naproxen, oxycodone, tiaznidine, pregabalin -Patient is interested in stopping Celebrex and monitoring symptoms if Dr. Tobie Poet approves.   Depression/Anxiety (Goal: manage symptoms of anxiety) -Controlled -Current  treatment: duloxetine 60 mg BID Appropriate, Query effective,  Clonazepam 1 mg daily at bedtime Appropriate, Effective, Safe, Accessible -Medications previously tried/failed: abilify, bupropion, seroquel -PHQ9:  Depression screen [facility] 2/9 10/24/2021 09/11/2021 08/07/2021  Decreased Interest 3 0 3  Down, Depressed, Hopeless 3  0 0  PHQ - 2 Score 6 0 3  Altered sleeping 0 0 0  Tired, decreased energy 1 0 3  Change in appetite 0 0 0  Feeling bad or failure about yourself  3 0 2  Trouble concentrating 2 0 0  Moving slowly or fidgety/restless 2 0 2  Suicidal thoughts 0 0 0  PHQ-9 Score 14 - 10  Difficult doing work/chores Very difficult Not difficult at all -  -GAD7:  GAD 7 : Generalized Anxiety Score 04/24/2021  Nervous, Anxious, on Edge 1  Control/stop worrying 1  Worry too much - different things 1  Trouble relaxing 1  Restless 0  Easily annoyed or irritable 0  Afraid - awful might happen 0  Total GAD 7 Score 4  -Educated on Benefits of medication for symptom control Feb 2023: PHQ9 is elevated and patient states he doesn't like Duloxetine, that it isn't helping. Will coordinate with PCP about changing therapy  Parkinson's Disease (Goal: improve symptoms - managed by Dr Tat) -Not Ideally Controlled -Current treatment  Sinemet IR 25-100 mg 1.5 tablet tid (7a/11a/4p) Appropriate, Effective, Query Safe, Accessible Primidone 50 mg daily at bedtime Appropriate, Effective, Safe, Accessible -Medications previously tried: none reported Feb 2023; Patient shaking more after dose increase. Will ask PCP about changing dose. Also, spent time counseling patient about going to Support Groups, explained how [employer] helped my grandpa, patient seemed very interested in this.   BPH (Goal: manage symptoms) -Controlled -Night Time Awakenings: 1/night  -Most of patient's issues are in morning -Current treatment  tadalafil 5 mg daily - trial off of it for efficacy Query Appropriate, Query effective,  Query Safe, Query accessible Tamsulosin 0.4 mg bid after a meal Appropriate, Query effective, Safe, Accessible -Medications previously tried:  terazosin Feb 2023: Patient unable to take Tadalafil due to cost. Patient read about Alfusosin and would like to try that. Also, spent extensive time explaining Kegel therapy   Patient Goals/Self-Care Activities Patient will:  - take medications as prescribed focus on medication adherence by using pill box  target a minimum of 150 minutes of moderate intensity exercise weekly  Follow Up Plan: Telephone follow up appointment with care management team member scheduled for: August 2023  [place] Constable, Sherian Rein.D. - [phone]      Mr. Niese was given information about Chronic Care Management services today including:  CCM service includes personalized support from designated clinical staff supervised by his physician, including individualized plan of care and coordination with other care providers 24/7 contact phone numbers for assistance for urgent and routine care needs. Standard insurance, coinsurance, copays and deductibles apply for chronic care management only during months in which we provide at least 20 minutes of these services. Most insurances cover these services at 100%, however patients may be responsible for any copay, coinsurance and/or deductible if applicable. This service may help you avoid the need for more expensive face-to-face services. Only one practitioner may furnish and bill the service in a calendar month. The patient may stop CCM services at any time (effective at the end of the month) by phone call to the office staff.  Patient agreed to services and verbal consent obtained.   The patient verbalized understanding of instructions, educational materials, and care plan provided today and declined offer to receive copy of patient instructions, educational materials, and care plan.  The pharmacy team will reach out to the  patient again over the next 60 days.   Lane Hacker, [place]

## 2021-10-29 ENCOUNTER — Telehealth: Payer: Self-pay

## 2021-10-29 DIAGNOSIS — Z1212 Encounter for screening for malignant neoplasm of rectum: Secondary | ICD-10-CM | POA: Diagnosis not present

## 2021-10-29 DIAGNOSIS — Z1211 Encounter for screening for malignant neoplasm of colon: Secondary | ICD-10-CM | POA: Diagnosis not present

## 2021-10-29 LAB — COLOGUARD: Cologuard: NEGATIVE

## 2021-10-29 NOTE — Chronic Care Management (AMB) (Signed)
Chronic Care Management Pharmacy Assistant   Name: Travis Reese  MRN: [id number] DOB: 07/31/1946  Reason for Encounter: Medication Review   10/29/2021- Patient called regarding follow up on recommendations from Clinical Pharmacist.   Recommendations/Changes made from today's visit: PHQ9 has gone up significantly and patient doesn't think Duloxetine is helping at all. Will ask PCP to change therapy or re-evaluate Patient agreed to try statin a month or so ago and I saw in your note you were thinking about trial, will ask PCP about Statin start Patient not able to afford Tadalafil, would like to try Alfuzosin for BPH, will ask PCP to send in Carb/Lev increased from 1TID->1.5TID, asked Neuro to decrease dose since patient is having worsening shaking post dose change  [place] Constable, Clinical Pharmacist has reached out to Neurologist twice and was advised to have patient contact office regarding Carbidopa/Levodopa. Called patient to inform, he is aware and will contact Neurology office tomorrow. Patient aware pharmacist is still waiting on response from PCP regarding recommendations pertaining to his Duloxetine, Statin and Talafil. As soon as he gets a response, we will contact him.  Medications: Outpatient Encounter Medications as of 10/29/2021  Medication Sig   acetaminophen (TYLENOL) 325 MG tablet Take 2 tablets (650 mg total) by mouth every 6 (six) hours as needed for mild pain (or Fever >/= 101).   albuterol (PROVENTIL HFA) 108 (90 Base) MCG/ACT inhaler Inhale 1-2 puffs into the lungs every 6 (six) hours as needed for wheezing or shortness of breath.   aspirin EC 325 MG EC tablet Take 1 tablet (325 mg total) by mouth daily.   carbidopa-levodopa (SINEMET IR) 25-100 MG tablet Take 1.5 tablets by mouth 3 (three) times daily. 9am/1pm/5pm   carvedilol (COREG) 25 MG tablet Take 1 tablet (25 mg total) by mouth 2 (two) times daily with a meal.   CEPHALEXIN PO Take by mouth. (Patient not  taking: Reported on 10/24/2021)   clonazePAM (KLONOPIN) 1 MG tablet Take 1 tablet (1 mg total) by mouth daily as needed for anxiety. One at night and one daily prn anxiety   Cyanocobalamin 1000 MCG SUBL Place 1,000 mcg under the tongue daily.   diclofenac (VOLTAREN) 75 MG EC tablet Take 75 mg by mouth 2 (two) times daily.   DULoxetine (CYMBALTA) 60 MG capsule Take 1 capsule (60 mg total) by mouth 2 (two) times daily.   fluticasone (FLONASE) 50 MCG/ACT nasal spray Place 1 spray into both nostrils 3 times/day as needed-between meals & bedtime.   Melatonin 10 MG CAPS Take 5 mg by mouth at bedtime.   montelukast (SINGULAIR) 10 MG tablet TAKE 1 TABLET BY MOUTH AT  BEDTIME   nitrofurantoin, macrocrystal-monohydrate, (MACROBID) 100 MG capsule Take 1 capsule (100 mg total) by mouth 2 (two) times daily.   primidone (MYSOLINE) 50 MG tablet Take 1 tablet (50 mg total) by mouth at bedtime.   spironolactone (ALDACTONE) 25 MG tablet Take 0.5 tablets (12.5 mg total) by mouth daily.   tadalafil (CIALIS) 5 MG tablet Take 1 tablet (5 mg total) by mouth daily.   tamsulosin (FLOMAX) 0.4 MG CAPS capsule Take 1 capsule (0.4 mg total) by mouth 2 (two) times daily after a meal.   testosterone cypionate (DEPOTESTOSTERONE CYPIONATE) 200 MG/ML injection Inject 1 mL (200 mg total) into the muscle once for 1 dose.   Zinc 50 MG TABS Take 50 mg by mouth at bedtime.   No facility-administered encounter medications on file as of 10/29/2021.    Felicity Coyer  Trilby Drummer, [place] Pharmacist Assistant [phone]

## 2021-10-30 DIAGNOSIS — I110 Hypertensive heart disease with heart failure: Secondary | ICD-10-CM

## 2021-10-30 DIAGNOSIS — E782 Mixed hyperlipidemia: Secondary | ICD-10-CM | POA: Diagnosis not present

## 2021-10-30 DIAGNOSIS — I5032 Chronic diastolic (congestive) heart failure: Secondary | ICD-10-CM | POA: Diagnosis not present

## 2021-10-31 ENCOUNTER — Telehealth: Payer: Self-pay

## 2021-10-31 NOTE — Chronic Care Management (AMB) (Signed)
? ? ?  Chronic Care Management ?Pharmacy Assistant  ? ?Name: Travis Reese  MRN: [id number] DOB: December 19, 1945 ? ? ?Reason for Encounter: General Adherence Call  ?  ?Recent office visits:  ?None ? ?Recent consult visits:  ?None ? ?Hospital visits:  ?None ? ?Medications: ?Outpatient Encounter Medications as of 10/31/2021  ?Medication Sig  ? acetaminophen (TYLENOL) 325 MG tablet Take 2 tablets (650 mg total) by mouth every 6 (six) hours as needed for mild pain (or Fever >/= 101).  ? albuterol (PROVENTIL HFA) 108 (90 Base) MCG/ACT inhaler Inhale 1-2 puffs into the lungs every 6 (six) hours as needed for wheezing or shortness of breath.  ? aspirin EC 325 MG EC tablet Take 1 tablet (325 mg total) by mouth daily.  ? carbidopa-levodopa (SINEMET IR) 25-100 MG tablet Take 1.5 tablets by mouth 3 (three) times daily. 9am/1pm/5pm  ? carvedilol (COREG) 25 MG tablet Take 1 tablet (25 mg total) by mouth 2 (two) times daily with a meal.  ? CEPHALEXIN PO Take by mouth. (Patient not taking: Reported on 10/24/2021)  ? clonazePAM (KLONOPIN) 1 MG tablet Take 1 tablet (1 mg total) by mouth daily as needed for anxiety. One at night and one daily prn anxiety  ? Cyanocobalamin 1000 MCG SUBL Place 1,000 mcg under the tongue daily.  ? diclofenac (VOLTAREN) 75 MG EC tablet Take 75 mg by mouth 2 (two) times daily.  ? DULoxetine (CYMBALTA) 60 MG capsule Take 1 capsule (60 mg total) by mouth 2 (two) times daily.  ? fluticasone (FLONASE) 50 MCG/ACT nasal spray Place 1 spray into both nostrils 3 times/day as needed-between meals & bedtime.  ? Melatonin 10 MG CAPS Take 5 mg by mouth at bedtime.  ? montelukast (SINGULAIR) 10 MG tablet TAKE 1 TABLET BY MOUTH AT  BEDTIME  ? nitrofurantoin, macrocrystal-monohydrate, (MACROBID) 100 MG capsule Take 1 capsule (100 mg total) by mouth 2 (two) times daily.  ? primidone (MYSOLINE) 50 MG tablet Take 1 tablet (50 mg total) by mouth at bedtime.  ? spironolactone (ALDACTONE) 25 MG tablet Take 0.5 tablets (12.5 mg total)  by mouth daily.  ? tadalafil (CIALIS) 5 MG tablet Take 1 tablet (5 mg total) by mouth daily.  ? tamsulosin (FLOMAX) 0.4 MG CAPS capsule Take 1 capsule (0.4 mg total) by mouth 2 (two) times daily after a meal.  ? testosterone cypionate (DEPOTESTOSTERONE CYPIONATE) 200 MG/ML injection Inject 1 mL (200 mg total) into the muscle once for 1 dose.  ? Zinc 50 MG TABS Take 50 mg by mouth at bedtime.  ? ?No facility-administered encounter medications on file as of 10/31/2021.  ? ? ?[place] for general disease state and medication adherence call.  ? ?Patient is not> 5 days past due for refill on the following medications per chart history: ? ?[facility] Medications: ?Medication Name/mg Last Fill Days Supply ?None noted  ? ?Unable to reach pt to complete this call  ? ?Care Gaps: ?Last annual wellness visit?08/10/21 ?Colonoscopy: Postponed 08/10/22 ?Last eye exam / retinopathy screening?None ?Diabetic foot exam?None ? ?Elray Mcgregor, CMA ?Clinical Pharmacist Assistant  ?[phone]  ?

## 2021-11-04 ENCOUNTER — Other Ambulatory Visit: Payer: Self-pay | Admitting: Family Medicine

## 2021-11-04 MED ORDER — ATORVASTATIN CALCIUM 20 MG PO TABS: 20.0000 mg | ORAL_TABLET | Freq: Every day | ORAL | 0 refills | Status: DC

## 2021-11-04 MED ORDER — ALFUZOSIN HCL ER 10 MG PO TB24: 10.0000 mg | ORAL_TABLET | Freq: Every day | ORAL | 1 refills | Status: DC

## 2021-11-06 ENCOUNTER — Telehealth: Payer: Self-pay

## 2021-11-06 NOTE — Telephone Encounter (Signed)
Per msg from PCP, "Please notify patient that I have sent lipitor 20 mg daily  ?And Alfuzosin 10 mg daily. Stop tamsulosin when gone and change to alfuzosin.  ?Recommend patient call for an appointment to discuss change to duloxetine if not helping.  ?Dr Tobie Poet " ? ?Called patient and let him know ?

## 2021-11-16 ENCOUNTER — Telehealth: Payer: Self-pay | Admitting: Neurology

## 2021-11-16 NOTE — Telephone Encounter (Signed)
Pt would like to talk with chelsea.  ?

## 2021-11-16 NOTE — Telephone Encounter (Signed)
Pt c/o:  side effect on medication ?New medication started: 07/24/21 Carbidopa levodopa was adjusted ?When did they start medication?  07/25/21 ?When did side effects start? A couple of weeks after the last visit ?Side effects reported: Tremors getting worse/ Patient clenching teeth ?Still taking medication? Yes.   ?Patient speaking to pharmacist who told the patient tat these were side effects of carbidopa levodopa  ?   ?

## 2021-11-19 ENCOUNTER — Telehealth: Payer: Self-pay

## 2021-11-19 NOTE — Telephone Encounter (Signed)
Patient will attend appointment  ?

## 2021-11-19 NOTE — Telephone Encounter (Signed)
Called patient to verify he can attend the appt on Thurs March 30th at 845. Put patient on schedule for now  ?

## 2021-11-19 NOTE — Progress Notes (Signed)
Called pt back after leaving a vm saying he had some questions. I left a vm to call me back if he still needed assistance. ? ?Elray Mcgregor, CMA ?Clinical Pharmacist Assistant  ?[phone]  ?

## 2021-11-27 NOTE — Progress Notes (Signed)
? ? ?Assessment/Plan:  ? ?1.  Parkinsons Disease, worsened by Abilify ? -Patient off Abilify since April, 2022 ? -pt is markedly improved off abilify and on levodopa ? -increase carbidopa/levodopa 25/100, 2 tablet 3 times daily, 9am/1pm/5pm ? -add carbidopa/levodopa 50/200 cr at bed to see if helps with first AM on ? -Happy to see the patient has been working on reduction of clonazepam. ? ?2.  History of cerebral infarction, with moderate sized PFO ? -Patient is on aspirin ? -Patient is on statin. ? ?3.  Carpal tunnel/ulnar neuropathy ? -I have been asked to provide medical clearance for his upcoming surgery.  Discussed that he will need to get medical clearance from his medical doctor. ? -Patient will need cardiac clearance from his cardiologist given the PFO. ? -From a stroke standpoint, it has been over 6 months since his last infarct.  Patient is fully optimized from a neurologic/Parkinson's standpoint for surgery.  However, he is at increased risk for another infarct given the PFO and having to go off of ASA.  He recognizes that and he states he has an appointment with his cardiologist coming up in December.  I would not recommend doing the surgery until he has cardiac clearance. ? ?4.  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 ? -He describes this, but I did not see it today.  He thought this is a side effect of levodopa.  Discussed that this is a side effect of disease, but not medication. ? ?5.  Essential tremor ? --the tremor he describes (eating) is likely from this ? -he is on primidone but doesn't want to increase it and actually wants to stop it.  We will trial that and see how he does ? ? ?Subjective:  ? ?Travis Reese was seen today in follow up for Parkinsons disease.  My previous records were reviewed prior to todays visit as well as outside records available to me.   we slightly increased patient's levodopa last visit.  Patient tolerated that well.  However, he did notice increased tremor since last visit.   He also noticed that he was clenching his teeth and his chronic care pharmacist told him that this was a side effect of levodopa.  Pt states that he thinks that he purposefully clenches so he does not have jaw tremor.  Discussed with patient last visit that I really would like to see him off daytime clonazepam and would like to see the nighttime reduced.  He reports that he is currently taking about half to 1 tablet/day.  Patient did see cardiology since our last visit and they agreed that there was likely nothing further to do, with the exception of medical therapy, for the PFO.   No falls.  No near syncope.  No hallucinations.  He is driving without trouble.  He notes that early AM before meds have kicked in is "worst time of day." ? ?Current prescribed movement disorder medications: ?carbidopa/levodopa 25/100, 2 tablets at 9am/1pm/5pm (increased) ? ? ?PREVIOUS MEDICATIONS: Sinemet ? ?ALLERGIES:  No Known Allergies ? ?CURRENT MEDICATIONS:  ?Outpatient Encounter Medications as of 11/29/2021  ?Medication Sig  ? acetaminophen (TYLENOL) 325 MG tablet Take 2 tablets (650 mg total) by mouth every 6 (six) hours as needed for mild pain (or Fever >/= 101).  ? albuterol (VENTOLIN HFA) 108 (90 Base) MCG/ACT inhaler Inhale 1-2 puffs into the lungs every 6 (six) hours as needed for wheezing or shortness of breath.  ? alfuzosin (UROXATRAL) 10 MG 24 hr tablet Take 1 tablet (  10 mg total) by mouth daily with breakfast. Discontinue tamsulosin.  ? aspirin EC 325 MG EC tablet Take 1 tablet (325 mg total) by mouth daily.  ? atorvastatin (LIPITOR) 20 MG tablet Take 1 tablet (20 mg total) by mouth daily.  ? carbidopa-levodopa (SINEMET CR) 50-200 MG tablet Take 1 tablet by mouth at bedtime.  ? carvedilol (COREG) 25 MG tablet Take 1 tablet (25 mg total) by mouth 2 (two) times daily with a meal.  ? clonazePAM (KLONOPIN) 1 MG tablet Take 1 tablet (1 mg total) by mouth daily as needed for anxiety. One at night and one daily prn anxiety  ?  Cyanocobalamin 1000 MCG SUBL Place 1,000 mcg under the tongue daily.  ? diclofenac (VOLTAREN) 75 MG EC tablet Take 75 mg by mouth 2 (two) times daily.  ? DULoxetine (CYMBALTA) 60 MG capsule Take 1 capsule (60 mg total) by mouth 2 (two) times daily.  ? fluticasone (FLONASE) 50 MCG/ACT nasal spray Place 1 spray into both nostrils 3 times/day as needed-between meals & bedtime.  ? Melatonin 10 MG CAPS Take 5 mg by mouth at bedtime.  ? montelukast (SINGULAIR) 10 MG tablet TAKE 1 TABLET BY MOUTH AT  BEDTIME  ? spironolactone (ALDACTONE) 25 MG tablet Take 0.5 tablets (12.5 mg total) by mouth daily.  ? tadalafil (CIALIS) 5 MG tablet Take 1 tablet (5 mg total) by mouth daily.  ? Zinc 50 MG TABS Take 50 mg by mouth at bedtime.  ? [DISCONTINUED] carbidopa-levodopa (SINEMET IR) 25-100 MG tablet Take 1.5 tablets by mouth 3 (three) times daily. 9am/1pm/5pm  ? [DISCONTINUED] primidone (MYSOLINE) 50 MG tablet Take 1 tablet (50 mg total) by mouth at bedtime.  ? carbidopa-levodopa (SINEMET IR) 25-100 MG tablet Take 2 tablets by mouth 3 (three) times daily. 9am/1pm/5pm  ? CEPHALEXIN PO Take by mouth. (Patient not taking: Reported on 10/24/2021)  ? nitrofurantoin, macrocrystal-monohydrate, (MACROBID) 100 MG capsule Take 1 capsule (100 mg total) by mouth 2 (two) times daily.  ? testosterone cypionate (DEPOTESTOSTERONE CYPIONATE) 200 MG/ML injection Inject 1 mL (200 mg total) into the muscle once for 1 dose.  ? ?No facility-administered encounter medications on file as of 11/29/2021.  ? ? ?Objective:  ? ?PHYSICAL EXAMINATION:   ? ?VITALS:   ?Vitals:  ? 11/29/21 0851  ?BP: (!) 143/91  ?Pulse: 63  ?SpO2: 95%  ?Weight: 191 lb 3.2 oz (86.7 kg)  ?Height: '6\' 2"'$  (1.88 m)  ? ? ? ?GEN:  The patient appears stated age and is in NAD. ?HEENT:  Normocephalic, atraumatic.  The mucous membranes are moist. The superficial temporal arteries are without ropiness or tenderness. ?CV:  RRR ?Lungs:  CTAB ?Neck/HEME:  There are no carotid bruits  bilaterally. ? ?Neurological examination: ? ?Orientation: The patient is alert and oriented x3. ?Cranial nerves: There is good facial symmetry with facial hypomimia. The speech is fluent and clear. Soft palate rises symmetrically and there is no tongue deviation. Hearing is intact to conversational tone. ?Sensation: Sensation is intact to light touch throughout ?Motor: Strength is at least antigravity x4. ? ?Movement examination: ?Tone: There is mild increased tone in the LUE ?Abnormal movements: no rest tremor ?Coordination:  There is mild decremation with RAM's, with any form of RAMS, including alternating supination and pronation of the forearm, hand opening and closing, finger taps, heel taps and toe taps on the L ?Gait and Station: The patient has min difficulty arising out of a deep-seated chair without the use of the hands. The patient's stride length  is decreased and is wide based but markedly better than last visit.   ? ?I have reviewed and interpreted the following labs independently ? ?  Chemistry   ?   ?Component Value Date/Time  ? NA 137 09/11/2021 1052  ? K 5.0 09/11/2021 1052  ? CL 101 09/11/2021 1052  ? CO2 27 09/11/2021 1052  ? BUN 13 09/11/2021 1052  ? CREATININE 0.78 09/11/2021 1052  ? CREATININE 0.77 07/21/2017 1401  ?    ?Component Value Date/Time  ? CALCIUM 9.4 09/11/2021 1052  ? ALKPHOS 80 09/11/2021 1052  ? AST 16 09/11/2021 1052  ? ALT 3 09/11/2021 1052  ? BILITOT 0.5 09/11/2021 1052  ?  ? ? ? ?Lab Results  ?Component Value Date  ? WBC 6.5 09/11/2021  ? HGB 13.3 09/11/2021  ? HCT 38.9 09/11/2021  ? MCV 99 (H) 09/11/2021  ? PLT 173 09/11/2021  ? ? ?Lab Results  ?Component Value Date  ? TSH 1.240 04/19/2021  ? ? ? ?Total time spent on today's visit was 41mnutes, including both face-to-face time and nonface-to-face time.  Time included that spent on review of records (prior notes available to me/labs/imaging if pertinent), discussing treatment and goals, answering patient's questions and  coordinating care. ? ?Cc:  [facility], KElnita Maxwell MD ? ?

## 2021-11-29 ENCOUNTER — Encounter: Payer: Self-pay | Admitting: Neurology

## 2021-11-29 ENCOUNTER — Ambulatory Visit: Payer: Medicare Other | Admitting: Neurology

## 2021-11-29 DIAGNOSIS — G20 Parkinson's disease: Secondary | ICD-10-CM | POA: Diagnosis not present

## 2021-11-29 MED ORDER — CARBIDOPA-LEVODOPA ER 50-200 MG PO TBCR: 1.0000 | EXTENDED_RELEASE_TABLET | Freq: Every day | ORAL | 1 refills | Status: DC

## 2021-11-29 MED ORDER — CARBIDOPA-LEVODOPA 25-100 MG PO TABS: 2.0000 | ORAL_TABLET | Freq: Three times a day (TID) | ORAL | 1 refills | Status: DC

## 2021-11-29 NOTE — Patient Instructions (Signed)
Increase carbidopa/levodopa 25/100 to 2 tablets at 9am/1pm/5pm ?Start carbidopa/levodopa 50/200 CR at bed ?STOP primidone ?

## 2021-12-03 ENCOUNTER — Telehealth: Payer: Self-pay

## 2021-12-03 NOTE — Progress Notes (Signed)
? ? ?Chronic Care Management ?Pharmacy Assistant  ? ?Name: Travis Reese  MRN: [id number] DOB: 08/17/1946 ? ? ?Reason for Encounter: General Adherence Call  ?  ?Recent office visits:  ?11/04/21 Rochel Brome MD. Orders Only. D/C Tamsulosin 0.'4mg'$  due to cost and Started on Alfuzosin '10mg'$  daily and Atorvastatin '20mg'$  daily.  ? ?Recent consult visits:  ?11/29/21 (Neurology) Tat, Wells Guiles DO. Seen for Parkinson's Disease. D/C Primidone '50mg'$ . Started on Carbidopa-Levodopa 50-'200mg'$  1 tablet at bedtime. Changed Carbidopa-Levodopa 25-'100mg'$  2 tablets three times daily at 9am, 1pm and 5pm.  ? ?Hospital visits:  ?None ? ?Medications: ?Outpatient Encounter Medications as of 12/03/2021  ?Medication Sig  ? acetaminophen (TYLENOL) 325 MG tablet Take 2 tablets (650 mg total) by mouth every 6 (six) hours as needed for mild pain (or Fever >/= 101).  ? albuterol (VENTOLIN HFA) 108 (90 Base) MCG/ACT inhaler Inhale 1-2 puffs into the lungs every 6 (six) hours as needed for wheezing or shortness of breath.  ? alfuzosin (UROXATRAL) 10 MG 24 hr tablet Take 1 tablet (10 mg total) by mouth daily with breakfast. Discontinue tamsulosin.  ? aspirin EC 325 MG EC tablet Take 1 tablet (325 mg total) by mouth daily.  ? atorvastatin (LIPITOR) 20 MG tablet Take 1 tablet (20 mg total) by mouth daily.  ? carbidopa-levodopa (SINEMET CR) 50-200 MG tablet Take 1 tablet by mouth at bedtime.  ? carbidopa-levodopa (SINEMET IR) 25-100 MG tablet Take 2 tablets by mouth 3 (three) times daily. 9am/1pm/5pm  ? carvedilol (COREG) 25 MG tablet Take 1 tablet (25 mg total) by mouth 2 (two) times daily with a meal.  ? CEPHALEXIN PO Take by mouth. (Patient not taking: Reported on 10/24/2021)  ? clonazePAM (KLONOPIN) 1 MG tablet Take 1 tablet (1 mg total) by mouth daily as needed for anxiety. One at night and one daily prn anxiety  ? Cyanocobalamin 1000 MCG SUBL Place 1,000 mcg under the tongue daily.  ? diclofenac (VOLTAREN) 75 MG EC tablet Take 75 mg by mouth 2 (two) times  daily.  ? DULoxetine (CYMBALTA) 60 MG capsule Take 1 capsule (60 mg total) by mouth 2 (two) times daily.  ? fluticasone (FLONASE) 50 MCG/ACT nasal spray Place 1 spray into both nostrils 3 times/day as needed-between meals & bedtime.  ? Melatonin 10 MG CAPS Take 5 mg by mouth at bedtime.  ? montelukast (SINGULAIR) 10 MG tablet TAKE 1 TABLET BY MOUTH AT  BEDTIME  ? nitrofurantoin, macrocrystal-monohydrate, (MACROBID) 100 MG capsule Take 1 capsule (100 mg total) by mouth 2 (two) times daily.  ? spironolactone (ALDACTONE) 25 MG tablet Take 0.5 tablets (12.5 mg total) by mouth daily.  ? tadalafil (CIALIS) 5 MG tablet Take 1 tablet (5 mg total) by mouth daily.  ? testosterone cypionate (DEPOTESTOSTERONE CYPIONATE) 200 MG/ML injection Inject 1 mL (200 mg total) into the muscle once for 1 dose.  ? Zinc 50 MG TABS Take 50 mg by mouth at bedtime.  ? ?No facility-administered encounter medications on file as of 12/03/2021.  ? ? ?[place] for general disease state and medication adherence call.  ? ?Patient is not > 5 days past due for refill on the following medications per chart history: ? ?Star Medications: ?Medication Name/mg Last Fill Days Supply ?Atorvastatin    11/04/21  90ds ? ? ?Care Gaps: ?Last annual wellness visit?08/10/21 ?Colonoscopy: Postponed 08/10/22 ?Last eye exam / retinopathy screening?None ?Diabetic foot exam?None ?  ?Unable to reach pt after several attempts  ? ?Elray Mcgregor, CMA ?Clinical  Pharmacist Assistant  ?[phone]  ?

## 2021-12-11 ENCOUNTER — Other Ambulatory Visit: Payer: Self-pay | Admitting: Family Medicine

## 2021-12-13 ENCOUNTER — Ambulatory Visit: Payer: Medicare Other | Admitting: Family Medicine

## 2021-12-23 ENCOUNTER — Other Ambulatory Visit: Payer: Self-pay | Admitting: Family Medicine

## 2021-12-24 ENCOUNTER — Other Ambulatory Visit: Payer: Self-pay | Admitting: Family Medicine

## 2021-12-24 NOTE — Telephone Encounter (Signed)
Refill sent to pharmacy.   

## 2022-01-05 ENCOUNTER — Other Ambulatory Visit: Payer: Self-pay | Admitting: Family Medicine

## 2022-01-07 NOTE — Telephone Encounter (Signed)
Refill sent to pharmacy.   

## 2022-01-09 ENCOUNTER — Telehealth: Payer: Self-pay

## 2022-01-09 NOTE — Chronic Care Management (AMB) (Signed)
01-07-2022: Patient's appointment time was was rescheduled for 02-09-2022 at 11:00 to 10:00. Patient has a few questions for [place] Constable CPP pertaining to changes at last visit. Patient stated he hasn't been able to get in contact with anyone. Told patient I will inform CPP. ? ?Jeannette How CMA ?Clinical Pharmacist Assistant ?[phone] ? ?

## 2022-01-09 NOTE — Telephone Encounter (Signed)
Patient had questions regarding BPH medications. Counseled on what they are and what they do. ? ?States that the Kegel exercises have helped greatly. He is able to hold his urine using the muscle and states it helps in "other areas" as well ?

## 2022-01-11 ENCOUNTER — Telehealth: Payer: Self-pay | Admitting: Neurology

## 2022-01-11 ENCOUNTER — Other Ambulatory Visit: Payer: Self-pay

## 2022-01-11 DIAGNOSIS — G20 Parkinson's disease: Secondary | ICD-10-CM

## 2022-01-11 MED ORDER — CARBIDOPA-LEVODOPA 25-100 MG PO TABS: 2.0000 | ORAL_TABLET | Freq: Three times a day (TID) | ORAL | 1 refills | Status: DC

## 2022-01-11 NOTE — Telephone Encounter (Signed)
Pt also requests a notification that it's been sent in ?

## 2022-01-11 NOTE — Telephone Encounter (Signed)
1. Which medications need refilled? (List name and dosage, if known) carbidopa-levodopa - increased dosage so is running out ? ?2. Which pharmacy/location is medication to be sent to? (include street and city if local pharmacy) [facility] ? ?3. Do they need a 30 day or 90 day supply? 90  ?

## 2022-01-11 NOTE — Telephone Encounter (Signed)
Refill sent to [facility] with updated prescription patient called and notified I have sent it  ?

## 2022-01-14 ENCOUNTER — Ambulatory Visit (INDEPENDENT_AMBULATORY_CARE_PROVIDER_SITE_OTHER): Payer: Medicare Other | Admitting: Family Medicine

## 2022-01-14 ENCOUNTER — Encounter: Payer: Self-pay | Admitting: Family Medicine

## 2022-01-14 ENCOUNTER — Ambulatory Visit: Payer: Medicare Other | Admitting: Family Medicine

## 2022-01-14 VITALS — BP 142/80 | HR 67 | Temp 98.5°F | Resp 15 | Ht 74.0 in | Wt 182.0 lb

## 2022-01-14 DIAGNOSIS — G20 Parkinson's disease: Secondary | ICD-10-CM | POA: Diagnosis not present

## 2022-01-14 DIAGNOSIS — F419 Anxiety disorder, unspecified: Secondary | ICD-10-CM

## 2022-01-14 DIAGNOSIS — E782 Mixed hyperlipidemia: Secondary | ICD-10-CM

## 2022-01-14 DIAGNOSIS — E538 Deficiency of other specified B group vitamins: Secondary | ICD-10-CM

## 2022-01-14 DIAGNOSIS — E559 Vitamin D deficiency, unspecified: Secondary | ICD-10-CM | POA: Diagnosis not present

## 2022-01-14 DIAGNOSIS — N401 Enlarged prostate with lower urinary tract symptoms: Secondary | ICD-10-CM

## 2022-01-14 DIAGNOSIS — I110 Hypertensive heart disease with heart failure: Secondary | ICD-10-CM

## 2022-01-14 DIAGNOSIS — I700 Atherosclerosis of aorta: Secondary | ICD-10-CM

## 2022-01-14 DIAGNOSIS — I5032 Chronic diastolic (congestive) heart failure: Secondary | ICD-10-CM

## 2022-01-14 DIAGNOSIS — F331 Major depressive disorder, recurrent, moderate: Secondary | ICD-10-CM

## 2022-01-14 HISTORY — DX: Major depressive disorder, recurrent, moderate: F33.1

## 2022-01-14 MED ORDER — FLUTICASONE PROPIONATE 50 MCG/ACT NA SUSP: 1.0000 | Freq: Two times a day (BID) | NASAL | 0 refills | Status: DC | PRN

## 2022-01-14 MED ORDER — CARIPRAZINE HCL 1.5 MG PO CAPS: 1.5000 mg | ORAL_CAPSULE | Freq: Every day | ORAL | 0 refills | Status: DC

## 2022-01-14 MED ORDER — FLUTICASONE PROPIONATE 50 MCG/ACT NA SUSP: 1.0000 | Freq: Two times a day (BID) | NASAL | 3 refills | Status: AC | PRN

## 2022-01-14 NOTE — Assessment & Plan Note (Addendum)
Well controlled.  ?No changes to medicines continue taking spironolactone 25 mg daily, carvedilol 25 mg twice daily ?Continue to work on eating a healthy diet and exercise.  ?Labs drawn today.  ?

## 2022-01-14 NOTE — Patient Instructions (Signed)
For depression: ?Start vraylar 1.5 mg once daily.  ? ? ? ?

## 2022-01-14 NOTE — Assessment & Plan Note (Signed)
Management per specialist. 

## 2022-01-14 NOTE — Assessment & Plan Note (Signed)
The current medical regimen is effective;  continue present plan and medications.  

## 2022-01-14 NOTE — Assessment & Plan Note (Signed)
Management per specialist The current medical regimen is effective;  continue present plan and medications.  

## 2022-01-14 NOTE — Assessment & Plan Note (Signed)
The current medical regimen is effective;  continue present plan and medications. Taking clonazepam 1 mg nightly and 1 daily prn for anxiety ?

## 2022-01-14 NOTE — Assessment & Plan Note (Signed)
Well controlled.  ?No changes to medicines.  ?Continue to work on eating a healthy diet and exercise.  ?Labs drawn today.  ?

## 2022-01-14 NOTE — Assessment & Plan Note (Signed)
Add vraylar 1.5 mg daily. Samples given.  ?Continue other medicines.  ?Follow up in 3 months.  ?

## 2022-01-14 NOTE — Progress Notes (Signed)
? ?Subjective:  ?Patient ID: Travis Reese, male    DOB: 07-Jul-1946  Age: 76 y.o. MRN: [id number] ? ?Chief Complaint  ?Patient presents with  ? Hyperlipidemia  ? Hypertension  ? Congestive Heart Failure  ? ? ?HPI ?CAD: He is taking atorvastatin 20 mg daily, aspirin 325 mg daily. ?CHF: Patient is taking spironolactone 25 mg daily, carvedilol 25 mg twice daily. Aspirin 325 mg daily.  ? ?Parkinsonism: Currently taking Sinemet CR 50-200 mg at bedtime and Sinemet IR 25-100 2 tablets three times a day. ? ?BPH: He takes Alfuzosin 10 mg daily.  ?[place] Constable, PHD: recommended kegel exercises. Have helped a lot.  ? ?Hypertensive CHF: Patient takes Carvedilol 25 mg twice a day. ? ?Depression/Anxiety:  Duloxetine 60 mg twice daily. Taking clonazepam 1 mg nightly and 1 daily prn for anxiety. ? ? ?  01/14/2022  ? 10:09 AM 10/24/2021  ? 10:34 AM 09/11/2021  ? 10:26 AM  ?PHQ9 SCORE ONLY  ?PHQ-9 Total Score 5 14 0  ? ? ? ? ? ?Arthritis knees, right hip, hands: diclofenac 75 mg one twice daily. Not taking tylenol every day. Hurts the most when he gets up in am.  ? ?B12 deficiency: 1000 mcg once daily.  ? ?Vitamin D Deficiency: on vitamin D 50K weekly.  ?  ?Current Outpatient Medications on File Prior to Visit  ?Medication Sig Dispense Refill  ? acetaminophen (TYLENOL) 325 MG tablet Take 2 tablets (650 mg total) by mouth every 6 (six) hours as needed for mild pain (or Fever >/= 101).    ? albuterol (VENTOLIN HFA) 108 (90 Base) MCG/ACT inhaler Inhale 1-2 puffs into the lungs every 6 (six) hours as needed for wheezing or shortness of breath.    ? alfuzosin (UROXATRAL) 10 MG 24 hr tablet Take 1 tablet (10 mg total) by mouth daily with breakfast. Discontinue tamsulosin. 90 tablet 1  ? aspirin EC 325 MG EC tablet Take 1 tablet (325 mg total) by mouth daily. 30 tablet 0  ? atorvastatin (LIPITOR) 20 MG tablet TAKE 1 TABLET BY MOUTH DAILY 90 tablet 3  ? carbidopa-levodopa (SINEMET CR) 50-200 MG tablet Take 1 tablet by mouth at bedtime. 90  tablet 1  ? carbidopa-levodopa (SINEMET IR) 25-100 MG tablet Take 2 tablets by mouth 3 (three) times daily. 9am/1pm/5pm 540 tablet 1  ? carvedilol (COREG) 25 MG tablet Take 1 tablet (25 mg total) by mouth 2 (two) times daily with a meal. 1 tablet 0  ? clonazePAM (KLONOPIN) 1 MG tablet TAKE 1 TABLET BY MOUTH NIGHTLY AND 1 DAILY AS NEEDED FOR ANXIETY 45 tablet 1  ? Cyanocobalamin 1000 MCG SUBL Place 1,000 mcg under the tongue daily.    ? diclofenac (VOLTAREN) 75 MG EC tablet Take 75 mg by mouth 2 (two) times daily.    ? DULoxetine (CYMBALTA) 60 MG capsule Take 1 capsule (60 mg total) by mouth 2 (two) times daily. 180 capsule 1  ? Melatonin 10 MG CAPS Take 5 mg by mouth at bedtime.    ? montelukast (SINGULAIR) 10 MG tablet TAKE 1 TABLET BY MOUTH AT  BEDTIME 90 tablet 3  ? spironolactone (ALDACTONE) 25 MG tablet Take 0.5 tablets (12.5 mg total) by mouth daily. 90 tablet 0  ? Vitamin D, Ergocalciferol, (DRISDOL) 1.25 MG (50000 UNIT) CAPS capsule TAKE 1 CAPSULE BY MOUTH EVERY  MONDAY 13 capsule 3  ? Zinc 50 MG TABS Take 50 mg by mouth at bedtime.    ? ?No current facility-administered medications on file  prior to visit.  ? ?Past Medical History:  ?Diagnosis Date  ? Allergic rhinosinusitis   ? Anxiety   ? Aortic valve sclerosis   ? Arthritis   ? knees, neck, shoulder  ? CHF (congestive heart failure) ([place])   ? Cubital tunnel syndrome   ? DDD (degenerative disc disease), cervical   ? Depression   ? Elevated PSA   ? denies per patient  ? HTN (hypertension)   ? Hyperlipidemia   ? Hyperplasia of prostate with lower urinary tract symptoms (LUTS)   ? Hypertension   ? Mild dilation of ascending aorta (H[facility])   ? Parkinson disease ([place])   ? Prolapsed internal hemorrhoids, grade 3 04/19/2015  ? Rectal prolapse   ? Scoliosis of lumbosacral spine   ? Stroke [facility])   ? x2  ? Tremor   ? Vitamin D deficiency   ? Wears hearing aid   ? bilateral  ? ?Past Surgical History:  ?Procedure Laterality Date  ? 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  BENTALL PROCEDURE N/A 05/01/2018  ? Procedure: BIOLOGICAL BENTALL PROCEDURE AORTIC ROOT REPLACEMENT WITH 30MM VALSALVA GRAFT & 27MM INSPIRIS VALVE.;  Surgeon: Rexene Alberts, MD;  Location: [place];  Service: Open Heart Surgery;  Laterality: N/A;  ? BUBBLE STUDY  04/13/2021  ? Procedure: BUBBLE STUDY;  Surgeon: Buford Dresser, MD;  Location: [facility] ENDOSCOPY;  Service: Cardiovascular;;  ? CATARACT EXTRACTION Bilateral 04/2018  ? COLONOSCOPY  2007  ? NASAL SINUS SURGERY  2006  ? RIGHT/LEFT HEART CATH AND CORONARY ANGIOGRAPHY N/A 04/27/2018  ? Procedure: RIGHT/LEFT HEART CATH AND CORONARY ANGIOGRAPHY;  Surgeon: Jolaine Artist, MD;  Location: [place] CV LAB;  Service: Cardiovascular;  Laterality: N/A;  ? TEE WITHOUT CARDIOVERSION N/A 04/27/2018  ? Procedure: TRANSESOPHAGEAL ECHOCARDIOGRAM (TEE);  Surgeon: Jolaine Artist, MD;  Location: [facility] ENDOSCOPY;  Service: Cardiovascular;  Laterality: N/A;  ? TEE WITHOUT CARDIOVERSION N/A 05/01/2018  ? Procedure: TRANSESOPHAGEAL ECHOCARDIOGRAM (TEE);  Surgeon: Rexene Alberts, MD;  Location: [place];  Service: Open Heart Surgery;  Laterality: N/A;  ? TEE WITHOUT CARDIOVERSION N/A 04/13/2021  ? Procedure: TRANSESOPHAGEAL ECHOCARDIOGRAM (TEE);  Surgeon: Buford Dresser, MD;  Location: [place];  Service: Cardiovascular;  Laterality: N/A;  ? TRANSANAL HEMORRHOIDAL DEARTERIALIZATION N/A 11/15/2015  ? Procedure: TRANSANAL HEMORRHOIDAL DEARTERIALIZATION;  Surgeon: Leighton Ruff, MD;  Location: [facility];  Service: General;  Laterality: N/A;  ?  ?Family History  ?Problem Relation Age of Onset  ? COPD Mother   ? Heart attack Mother   ? Colon polyps Father   ? Prostate cancer Father   ? Parkinson's disease Father   ? Hyperlipidemia Father   ? Hypertension Sister   ? Hypertension Brother   ? Breast cancer Sister   ? Prostate cancer Paternal Uncle   ? ?Social History  ? ?Socioeconomic History  ? Marital status: Single  ?  Spouse name: Not on file  ?  Number of children: 2  ? Years of education: Not on file  ? Highest education level: Not on file  ?Occupational History  ? Occupation: retired  ?  Comment: security guard  ?Tobacco Use  ? Smoking status: Some Days  ?  Packs/day: 0.10  ?  Years: 50.00  ?  Pack years: 5.00  ?  Types: Cigarettes  ?  Last attempt to quit: 05/2018  ?  Years since quitting: 3.7  ? Smokeless tobacco: Never  ?Vaping Use  ? Vaping Use: Former  ?Substance and Sexual Activity  ?  Alcohol use: Not Currently  ?  Comment: Occasional  ? Drug use: No  ? Sexual activity: Not Currently  ?Other Topics Concern  ? Not on file  ?Social History Narrative  ? Divorced lives with father after incarceration x 5 yrs  ? 1 son, 1 daughter  ? Volunteer work  ? 3 caffeine/day  ? 04/19/2015  ? Right handed  ? ?Social Determinants of Health  ? ?Financial Resource Strain: Low Risk   ? Difficulty of Paying Living Expenses: Not hard at all  ?Food Insecurity: No Food Insecurity  ? Worried About Charity fundraiser in the Last Year: Never true  ? Ran Out of Food in the Last Year: Never true  ?Transportation Needs: No Transportation Needs  ? Lack of Transportation (Medical): No  ? Lack of Transportation (Non-Medical): No  ?Physical Activity: Sufficiently Active  ? Days of Exercise per Week: 7 days  ? Minutes of Exercise per Session: 60 min  ?Stress: No Stress Concern Present  ? Feeling of Stress : Not at all  ?Social Connections: Moderately Isolated  ? Frequency of Communication with Friends and Family: More than three times a week  ? Frequency of Social Gatherings with Friends and Family: Three times a week  ? Attends Religious Services: Never  ? Active Member of Clubs or Organizations: No  ? Attends Archivist Meetings: Never  ? Marital Status: Living with partner  ? ? ?Review of Systems  ?Constitutional:  Negative for chills, fatigue, fever and unexpected weight change.  ?HENT:  Negative for congestion, ear pain, sinus pain and sore throat.   ?Respiratory:   Negative for cough and shortness of breath.   ?Cardiovascular:  Negative for chest pain and palpitations.  ?Gastrointestinal:  Negative for abdominal pain, blood in stool, constipation, diarrhea, nausea a

## 2022-01-15 LAB — VITAMIN D 25 HYDROXY (VIT D DEFICIENCY, FRACTURES): Vit D, 25-Hydroxy: 62.5 ng/mL (ref 30.0–100.0)

## 2022-01-15 LAB — CBC WITH DIFFERENTIAL/PLATELET
Basophils Absolute: 0 10*3/uL (ref 0.0–0.2)
Basos: 0 %
EOS (ABSOLUTE): 0.3 10*3/uL (ref 0.0–0.4)
Eos: 4 %
Hematocrit: 40.1 % (ref 37.5–51.0)
Hemoglobin: 13.8 g/dL (ref 13.0–17.7)
Immature Grans (Abs): 0 10*3/uL (ref 0.0–0.1)
Immature Granulocytes: 0 %
Lymphocytes Absolute: 1.8 10*3/uL (ref 0.7–3.1)
Lymphs: 26 %
MCH: 34.2 pg — ABNORMAL HIGH (ref 26.6–33.0)
MCHC: 34.4 g/dL (ref 31.5–35.7)
MCV: 100 fL — ABNORMAL HIGH (ref 79–97)
Monocytes Absolute: 0.5 10*3/uL (ref 0.1–0.9)
Monocytes: 8 %
Neutrophils Absolute: 4.3 10*3/uL (ref 1.4–7.0)
Neutrophils: 62 %
Platelets: 182 10*3/uL (ref 150–450)
RBC: 4.03 x10E6/uL — ABNORMAL LOW (ref 4.14–5.80)
RDW: 12.3 % (ref 11.6–15.4)
WBC: 6.9 10*3/uL (ref 3.4–10.8)

## 2022-01-15 LAB — LIPID PANEL
Chol/HDL Ratio: 2.1 ratio (ref 0.0–5.0)
Cholesterol, Total: 99 mg/dL — ABNORMAL LOW (ref 100–199)
HDL: 48 mg/dL (ref >39)
LDL Chol Calc (NIH): 38 mg/dL (ref 0–99)
Triglycerides: 54 mg/dL (ref 0–149)
VLDL Cholesterol Cal: 13 mg/dL (ref 5–40)

## 2022-01-15 LAB — COMPREHENSIVE METABOLIC PANEL
ALT: 10 IU/L (ref 0–44)
AST: 19 IU/L (ref 0–40)
Albumin/Globulin Ratio: 1.5 (ref 1.2–2.2)
Albumin: 4 g/dL (ref 3.7–4.7)
Alkaline Phosphatase: 90 IU/L (ref 44–121)
BUN/Creatinine Ratio: 20 (ref 10–24)
BUN: 17 mg/dL (ref 8–27)
Bilirubin Total: 0.5 mg/dL (ref 0.0–1.2)
CO2: 24 mmol/L (ref 20–29)
Calcium: 9.2 mg/dL (ref 8.6–10.2)
Chloride: 103 mmol/L (ref 96–106)
Creatinine, Ser: 0.83 mg/dL (ref 0.76–1.27)
Globulin, Total: 2.7 g/dL (ref 1.5–4.5)
Glucose: 92 mg/dL (ref 70–99)
Potassium: 4.3 mmol/L (ref 3.5–5.2)
Sodium: 140 mmol/L (ref 134–144)
Total Protein: 6.7 g/dL (ref 6.0–8.5)
eGFR: 92 mL/min/{1.73_m2} (ref >59)

## 2022-01-15 LAB — CARDIOVASCULAR RISK ASSESSMENT

## 2022-01-15 LAB — TSH: TSH: 1.28 u[IU]/mL (ref 0.450–4.500)

## 2022-01-15 LAB — VITAMIN B12: Vitamin B-12: >2000 pg/mL — ABNORMAL HIGH (ref 232–1245)

## 2022-01-15 LAB — PSA: Prostate Specific Ag, Serum: 1.5 ng/mL (ref 0.0–4.0)

## 2022-01-16 ENCOUNTER — Other Ambulatory Visit: Payer: Self-pay | Admitting: Family Medicine

## 2022-01-22 ENCOUNTER — Other Ambulatory Visit: Payer: Self-pay | Admitting: Family Medicine

## 2022-01-31 ENCOUNTER — Telehealth: Payer: Self-pay

## 2022-01-31 ENCOUNTER — Ambulatory Visit: Payer: Medicare Other | Admitting: Neurology

## 2022-01-31 NOTE — Telephone Encounter (Signed)
Recommend give some more samples and see if patient will qualify for patient assistance. I will ask ccm to work on.  Please reconsent him for ccm care. Ask if he would like to continue with them basically. Let me know ans I will redo ccm referral. Dr Tobie Poet

## 2022-01-31 NOTE — Telephone Encounter (Signed)
Left detailed message for patient to return call.  Travis Reese 01/31/22 2:31 PM

## 2022-01-31 NOTE — Chronic Care Management (AMB) (Signed)
Patient Assistance Coordination  Filled out patient assistance application for Vraylar with [facility] Assist patient assistance program. Requesting print out from clinical team and placing in the mail to patient.  Pattricia Boss, [place] Pharmacist Assistant [phone]

## 2022-01-31 NOTE — Telephone Encounter (Signed)
Patient is unable to continue vraylar. He has not had any negative reaction, actually feels it has been helpful. He is unable to continue due to expense. Requests alternative be [facility] to [facility]. Medication was started 5/15 by Dr Tobie Poet, follow up scheduled for 6/5 w/ Larene Beach. Please also advise follow up.   Callback: [phone] Harrell Lark 01/31/22 10:45 AM

## 2022-01-31 NOTE — Telephone Encounter (Signed)
Filling out application for Vraylar patient assistance and mailing to patient.  Pattricia Boss, [place] Pharmacist Assistant [phone]

## 2022-02-01 NOTE — Telephone Encounter (Signed)
Spoke with patient, he will re-schedule appt with PCP and CCM team has already started PAP application. Explained to patient to continue meds, to get more samples, etc.  House Income: 2928$/month  $35,136

## 2022-02-04 ENCOUNTER — Ambulatory Visit: Payer: Medicare Other | Admitting: Nurse Practitioner

## 2022-02-05 ENCOUNTER — Encounter: Payer: Self-pay | Admitting: Nurse Practitioner

## 2022-02-05 ENCOUNTER — Ambulatory Visit (INDEPENDENT_AMBULATORY_CARE_PROVIDER_SITE_OTHER): Payer: Medicare Other | Admitting: Nurse Practitioner

## 2022-02-05 VITALS — BP 142/84 | HR 64 | Temp 96.5°F | Ht 74.0 in | Wt 187.0 lb

## 2022-02-05 DIAGNOSIS — F331 Major depressive disorder, recurrent, moderate: Secondary | ICD-10-CM

## 2022-02-05 NOTE — Patient Instructions (Signed)
Continue medications Follow-up on 07/22/22 at 9:20 am with Dr Travis Reese   Managing Depression, Adult Depression is a mental health condition that affects your thoughts, feelings, and actions. Being diagnosed with depression can bring you relief if you did not know why you have felt or behaved a certain way. It could also leave you feeling overwhelmed with uncertainty about your future. Preparing yourself to manage your symptoms can help you feel more positive about your future. How to manage lifestyle changes Managing stress  Stress is your body's reaction to life changes and events, both good and bad. Stress can add to your feelings of depression. Learning to manage your stress can help lessen your feelings of depression. Try some of the following approaches to reducing your stress (stress reduction techniques): Listen to music that you enjoy and that inspires you. Try using a meditation app or take a meditation class. Develop a practice that helps you connect with your spiritual self. Walk in nature, pray, or go to a place of worship. Do some deep breathing. To do this, inhale slowly through your nose. Pause at the top of your inhale for a few seconds and then exhale slowly, letting your muscles relax. Practice yoga to help relax and work your muscles. Choose a stress reduction technique that suits your lifestyle and personality. These techniques take time and practice to develop. Set aside 5-15 minutes a day to do them. Therapists can offer training in these techniques. Other things you can do to manage stress include: Keeping a stress diary. Knowing your limits and saying no when you think something is too much. Paying attention to how you react to certain situations. You may not be able to control everything, but you can change your reaction. Adding humor to your life by watching funny films or TV shows. Making time for activities that you enjoy and that relax you.  Medicines Medicines, such  as antidepressants, are often a part of treatment for depression. Talk with your pharmacist or health care provider about all the medicines, supplements, and herbal products that you take, their possible side effects, and what medicines and other products are safe to take together. Make sure to report any side effects you may have to your health care provider. Relationships Your health care provider may suggest family therapy, couples therapy, or individual therapy as part of your treatment. How to recognize changes Everyone responds differently to treatment for depression. As you recover from depression, you may start to: Have more interest in doing activities. Feel less hopeless. Have more energy. Overeat less often, or have a better appetite. Have better mental focus. It is important to recognize if your depression is not getting better or is getting worse. The symptoms you had in the beginning may return, such as: Tiredness (fatigue) or low energy. Eating too much or too little. Sleeping too much or too little. Feeling restless, agitated, or hopeless. Trouble focusing or making decisions. Unexplained physical complaints. Feeling irritable, angry, or aggressive. If you or your family members notice these symptoms coming back, let your health care provider know right away. Follow these instructions at home: Activity  Try to get some form of exercise each day, such as walking, biking, swimming, or lifting weights. Practice stress reduction techniques. Engage your mind by taking a class or doing some volunteer work. Lifestyle Get the right amount and quality of sleep. Cut down on using caffeine, tobacco, alcohol, and other potentially harmful substances. Eat a healthy diet that includes plenty of vegetables,  fruits, whole grains, low-fat dairy products, and lean protein. Do not eat a lot of foods that are high in solid fats, added sugars, or salt (sodium). General instructions Take  over-the-counter and prescription medicines only as told by your health care provider. Keep all follow-up visits as told by your health care provider. This is important. Where to find support Talking to others  Friends and family members can be sources of support and guidance. Talk to trusted friends or family members about your condition. Explain your symptoms to them, and let them know that you are working with a health care provider to treat your depression. Tell friends and family members how they also can be helpful. Finances Find appropriate mental health providers that fit with your financial situation. Talk with your health care provider about options to get reduced prices on your medicines. Where to find more information You can find support in your area from: Anxiety and Depression Association of America (ADAA): www.adaa.org Mental Health America: www.mentalhealthamerica.net [employer] on Mental Illness: www.nami.org Contact a health care provider if: You stop taking your antidepressant medicines, and you have any of these symptoms: Nausea. Headache. Light-headedness. Chills and body aches. Not being able to sleep (insomnia). You or your friends and family think your depression is getting worse. Get help right away if: You have thoughts of hurting yourself or others. If you ever feel like you may hurt yourself or others, or have thoughts about taking your own life, get help right away. Go to your nearest emergency department or: Call your local emergency services (911 in the [place] Call a suicide crisis helpline, such as the [place] at 2760317474 or 988 in the [place]. This is open 24 hours a day in the [place] Text the Crisis Text Line at 916 518 4952 (in the [place].). Summary If you are diagnosed with depression, preparing yourself to manage your symptoms is a good way to feel positive about your future. Work with your health care provider on a  management plan that includes stress reduction techniques, medicines (if applicable), therapy, and healthy lifestyle habits. Keep talking with your health care provider about how your treatment is working. If you have thoughts about taking your own life, call a suicide crisis helpline or text a crisis text line. This information is not intended to replace advice given to you by your health care provider. Make sure you discuss any questions you have with your health care provider. Document Revised: 03/14/2021 Document Reviewed: 06/30/2019 Elsevier Patient Education  [place].

## 2022-02-05 NOTE — Progress Notes (Signed)
Subjective:  Patient ID: Travis Reese, male    DOB: 1945-10-12  Age: 76 y.o. MRN: [id number]  Chief Complaint  Patient presents with   Depression        Depression, Follow-up  He  was last seen for this 3 weeks ago. Current treatment includes Cymbalta and started Vraylar 3 weeks ago.  He reports excellent compliance with treatment. He is not having side effects.  He reports excellent tolerance of treatment. Current symptoms include: none He feels he is Improved since last visit.     02/05/2022    9:49 AM 01/14/2022   10:09 AM 10/24/2021   10:34 AM  Depression screen PHQ 2/9  Decreased Interest 0 0 3  Down, Depressed, Hopeless '1 1 3  '$ PHQ - 2 Score '1 1 6  '$ Altered sleeping 0 0 0  Tired, decreased energy 0 2 1  Change in appetite 0 0 0  Feeling bad or failure about yourself  0 0 3  Trouble concentrating '1 2 2  '$ Moving slowly or fidgety/restless 0 0 2  Suicidal thoughts 0 0 0  PHQ-9 Score '2 5 14  '$ Difficult doing work/chores Not difficult at all Somewhat difficult Very difficult     Current Outpatient Medications on File Prior to Visit  Medication Sig Dispense Refill   acetaminophen (TYLENOL) 325 MG tablet Take 2 tablets (650 mg total) by mouth every 6 (six) hours as needed for mild pain (or Fever >/= 101).     albuterol (VENTOLIN HFA) 108 (90 Base) MCG/ACT inhaler Inhale 1-2 puffs into the lungs every 6 (six) hours as needed for wheezing or shortness of breath.     alfuzosin (UROXATRAL) 10 MG 24 hr tablet Take 1 tablet (10 mg total) by mouth daily with breakfast. Discontinue tamsulosin. 90 tablet 1   aspirin EC 325 MG EC tablet Take 1 tablet (325 mg total) by mouth daily. 30 tablet 0   atorvastatin (LIPITOR) 20 MG tablet TAKE 1 TABLET BY MOUTH DAILY 90 tablet 3   carbidopa-levodopa (SINEMET CR) 50-200 MG tablet Take 1 tablet by mouth at bedtime. 90 tablet 1   carbidopa-levodopa (SINEMET IR) 25-100 MG tablet Take 2 tablets by mouth 3 (three) times daily. 9am/1pm/5pm 540  tablet 1   cariprazine (VRAYLAR) 1.5 MG capsule Take 1 capsule (1.5 mg total) by mouth daily. 21 capsule 0   carvedilol (COREG) 25 MG tablet Take 1 tablet (25 mg total) by mouth 2 (two) times daily with a meal. 1 tablet 0   clonazePAM (KLONOPIN) 1 MG tablet TAKE 1 TABLET BY MOUTH NIGHTLY AND 1 DAILY AS NEEDED FOR ANXIETY 45 tablet 1   Cyanocobalamin 1000 MCG SUBL Place 1,000 mcg under the tongue daily.     diclofenac (VOLTAREN) 75 MG EC tablet Take 75 mg by mouth 2 (two) times daily.     DULoxetine (CYMBALTA) 60 MG capsule Take 1 capsule (60 mg total) by mouth 2 (two) times daily. 180 capsule 1   fluticasone (FLONASE) 50 MCG/ACT nasal spray Place 1 spray into both nostrils 3 times/day as needed-between meals & bedtime. 48 g 3   Melatonin 10 MG CAPS Take 5 mg by mouth at bedtime.     montelukast (SINGULAIR) 10 MG tablet TAKE 1 TABLET BY MOUTH AT  BEDTIME 90 tablet 3   spironolactone (ALDACTONE) 25 MG tablet TAKE ONE-HALF TABLET BY MOUTH  DAILY 45 tablet 3   Vitamin D, Ergocalciferol, (DRISDOL) 1.25 MG (50000 UNIT) CAPS capsule TAKE 1 CAPSULE BY MOUTH EVERY  MONDAY 13 capsule 3   Zinc 50 MG TABS Take 50 mg by mouth at bedtime.     No current facility-administered medications on file prior to visit.   Past Medical History:  Diagnosis Date   Allergic rhinosinusitis    Anxiety    Aortic valve sclerosis    Arthritis    knees, neck, shoulder   CHF (congestive heart failure) (HCC)    Cubital tunnel syndrome    DDD (degenerative disc disease), cervical    Depression    Elevated PSA    denies per patient   HTN (hypertension)    Hyperlipidemia    Hyperplasia of prostate with lower urinary tract symptoms (LUTS)    Hypertension    Mild dilation of ascending aorta (HCC)    Parkinson disease (HCC)    Prolapsed internal hemorrhoids, grade 3 04/19/2015   Rectal prolapse    Scoliosis of lumbosacral spine    Stroke ([place])    x2   Tremor    Vitamin D deficiency    Wears hearing aid    bilateral    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   BENTALL PROCEDURE N/A 05/01/2018   Procedure: BIOLOGICAL BENTALL PROCEDURE AORTIC ROOT REPLACEMENT WITH 30MM VALSALVA GRAFT & 27MM INSPIRIS VALVE.;  Surgeon: Rexene Alberts, MD;  Location: [place];  Service: Open Heart Surgery;  Laterality: N/A;   BUBBLE STUDY  04/13/2021   Procedure: BUBBLE STUDY;  Surgeon: Buford Dresser, MD;  Location: [facility] ENDOSCOPY;  Service: Cardiovascular;;   CATARACT EXTRACTION Bilateral 04/2018   COLONOSCOPY  2007   NASAL SINUS SURGERY  2006   RIGHT/LEFT HEART CATH AND CORONARY ANGIOGRAPHY N/A 04/27/2018   Procedure: RIGHT/LEFT HEART CATH AND CORONARY ANGIOGRAPHY;  Surgeon: Jolaine Artist, MD;  Location: [place] CV LAB;  Service: Cardiovascular;  Laterality: N/A;   TEE WITHOUT CARDIOVERSION N/A 04/27/2018   Procedure: TRANSESOPHAGEAL ECHOCARDIOGRAM (TEE);  Surgeon: Jolaine Artist, MD;  Location: [facility] ENDOSCOPY;  Service: Cardiovascular;  Laterality: N/A;   TEE WITHOUT CARDIOVERSION N/A 05/01/2018   Procedure: TRANSESOPHAGEAL ECHOCARDIOGRAM (TEE);  Surgeon: Rexene Alberts, MD;  Location: [place];  Service: Open Heart Surgery;  Laterality: N/A;   TEE WITHOUT CARDIOVERSION N/A 04/13/2021   Procedure: TRANSESOPHAGEAL ECHOCARDIOGRAM (TEE);  Surgeon: Buford Dresser, MD;  Location: [facility] ENDOSCOPY;  Service: Cardiovascular;  Laterality: N/A;   TRANSANAL HEMORRHOIDAL DEARTERIALIZATION N/A 11/15/2015   Procedure: TRANSANAL HEMORRHOIDAL DEARTERIALIZATION;  Surgeon: Leighton Ruff, MD;  Location: [place];  Service: General;  Laterality: N/A;    Family History  Problem Relation Age of Onset   COPD Mother    Heart attack Mother    Colon polyps Father    Prostate cancer Father    Parkinson's disease Father    Hyperlipidemia Father    Hypertension Sister    Hypertension Brother    Breast cancer Sister    Prostate cancer Paternal Uncle     Social History   Socioeconomic History   Marital status: Single    Spouse name: Not on file   Number of children: 2   Years of education: Not on file   Highest education level: Not on file  Occupational History   Occupation: retired    Comment: security guard  Tobacco Use   Smoking status: Some Days    Packs/day: 0.10    Years: 50.00    Pack years: 5.00    Types: Cigarettes    Last attempt to  quit: 05/2018    Years since quitting: 3.7   Smokeless tobacco: Never  Vaping Use   Vaping Use: Former  Substance and Sexual Activity   Alcohol use: Not Currently    Comment: Occasional   Drug use: No   Sexual activity: Not Currently  Other Topics Concern   Not on file  Social History Narrative   Divorced lives with father after incarceration x 5 yrs   1 son, 1 daughter   Volunteer work   3 caffeine/day   04/19/2015   Right handed   Social Determinants of Health   Financial Resource Strain: Low Risk    Difficulty of Paying Living Expenses: Not hard at all  Food Insecurity: No Food Insecurity   Worried About Charity fundraiser in the Last Year: Never true   [place] in the Last Year: Never true  Transportation Needs: No Transportation Needs   Lack of Transportation (Medical): No   Lack of Transportation (Non-Medical): No  Physical Activity: Sufficiently Active   Days of Exercise per Week: 7 days   Minutes of Exercise per Session: 60 min  Stress: No Stress Concern Present   Feeling of Stress : Not at all  Social Connections: Moderately Isolated   Frequency of Communication with Friends and Family: More than three times a week   Frequency of Social Gatherings with Friends and Family: Three times a week   Attends Religious Services: Never   Active Member of Clubs or Organizations: No   Attends Archivist Meetings: Never   Marital Status: Living with partner    Review of Systems  Constitutional:  Negative for chills, diaphoresis, fatigue and fever.   Respiratory:  Negative for cough and shortness of breath.   Cardiovascular:  Negative for chest pain and leg swelling.  Psychiatric/Behavioral:  Positive for depression. Negative for dysphoric mood.     Objective:  BP (!) 142/84   Pulse 64   Temp (!) 96.5 F (35.8 C)   Ht '6\' 2"'$  (1.88 m)   Wt 187 lb (84.8 kg)   SpO2 96%   BMI 24.01 kg/m       02/05/2022    9:45 AM 01/14/2022    9:12 AM 11/29/2021    8:51 AM  BP/Weight  Systolic BP  323 557  Diastolic BP  80 91  Wt. (Lbs) 187 182 191.2  BMI 24.01 kg/m2 23.37 kg/m2 24.55 kg/m2    Physical Exam Vitals reviewed.  Constitutional:      Appearance: Normal appearance.  Skin:    General: Skin is warm and dry.     Capillary Refill: Capillary refill takes less than 2 seconds.     Findings: Bruising (right forearm) present.  Neurological:     General: No focal deficit present.     Mental Status: He is alert and oriented to person, place, and time.  Psychiatric:        Mood and Affect: Mood normal.        Behavior: Behavior normal.        Lab Results  Component Value Date   WBC 6.9 01/14/2022   HGB 13.8 01/14/2022   HCT 40.1 01/14/2022   PLT 182 01/14/2022   GLUCOSE 92 01/14/2022   CHOL 99 (L) 01/14/2022   TRIG 54 01/14/2022   HDL 48 01/14/2022   LDLCALC 38 01/14/2022   ALT 10 01/14/2022   AST 19 01/14/2022   NA 140 01/14/2022   K 4.3 01/14/2022   CL  103 01/14/2022   CREATININE 0.83 01/14/2022   BUN 17 01/14/2022   CO2 24 01/14/2022   TSH 1.280 01/14/2022   INR 1.58 05/01/2018      Assessment & Plan:   1. Moderate recurrent major depression (HCC)-well controlled  -continue Vraylar 1.5 mg daily as prescribed    Continue medications Follow-up on 07/22/22 at 9:20 am with Dr Tobie Poet     Follow-up: 07/22/22 at 9:20 am  An After Visit Summary was printed and given to the patient.  I, Rip Harbour, NP, have reviewed all documentation for this visit. The documentation on 02/05/22 for the exam, diagnosis,  procedures, and orders are all accurate and complete.    Signed, Rip Harbour, NP [place] [phone]

## 2022-02-14 ENCOUNTER — Ambulatory Visit: Payer: Medicare Other | Admitting: Cardiology

## 2022-02-16 ENCOUNTER — Other Ambulatory Visit: Payer: Self-pay | Admitting: Family Medicine

## 2022-02-17 ENCOUNTER — Other Ambulatory Visit: Payer: Self-pay | Admitting: Family Medicine

## 2022-02-20 ENCOUNTER — Ambulatory Visit (INDEPENDENT_AMBULATORY_CARE_PROVIDER_SITE_OTHER): Payer: Medicare Other

## 2022-02-20 DIAGNOSIS — G20 Parkinson's disease: Secondary | ICD-10-CM

## 2022-02-20 DIAGNOSIS — F331 Major depressive disorder, recurrent, moderate: Secondary | ICD-10-CM

## 2022-02-20 DIAGNOSIS — I5032 Chronic diastolic (congestive) heart failure: Secondary | ICD-10-CM

## 2022-02-20 NOTE — Patient Instructions (Signed)
Visit Information   Goals Addressed   None    Patient Care Plan: ccm pharmacy care plan     Problem Identified: anxiety, bp, hld, heart failure   Priority: High  Onset Date: 04/24/2021     Long-Range Goal: Disease State Managment   Start Date: 04/24/2021  Expected End Date: 04/24/2022  Recent Progress: On track  Priority: High  Note:   Current Barriers:  Unable to maintain control of cholesterol  Pharmacist Clinical Goal(s):  Patient will maintain control of cholesterol as evidenced by lipid panel  through collaboration with PharmD and provider.   Interventions: 1:1 collaboration with Rochel Brome, MD regarding development and update of comprehensive plan of care as evidenced by provider attestation and co-signature Inter-disciplinary care team collaboration (see longitudinal plan of care) Comprehensive medication review performed; medication list updated in electronic medical record  Hypertension (BP goal <140/90) -Managed by Dr. Agustin Cree, [place] BP Readings from Last 3 Encounters:  02/05/22 (!) 142/84  01/14/22 (!) 142/80  11/29/21 (!) 143/91  -Controlled -Current treatment: Carvedilol 25 mg bid with a meal Appropriate, Effective, Safe, Accessible Spironolactone 25 mg take 1/2 daily Appropriate, Effective, Safe, Accessible -Medications previously tried: furosemide, lisinopril-hydrochlorothiazide -Current home readings: not checking often -Current dietary habits: eats meats and vegetables per patient. Eating 3 meals daily.  -Current exercise habits: very limited. Encouraged him to consider boxing class or other exercise.  -Denies hypotensive/hypertensive symptoms -Educated on BP goals and benefits of medications for prevention of heart attack, stroke and kidney damage; Daily salt intake goal < 2300 mg; Exercise goal of 150 minutes per week; -Counseled to monitor BP at home as needed, document, and provide log at future appointments -Counseled on diet and exercise  extensively Recommended to continue current medication  CAD: (LDL goal < 70) -Not ideally controlled -Current treatment: ASA '325mg'$  Appropriate, Effective, Safe, Accessible Atorvastatian '20mg'$  Appropriate, Effective, Safe, Accessible -Medications previously tried: patient has never been on statin  -Current dietary habits: eats meats and vegetables per patient. Eating 3 meals daily.  -Current exercise habits: very limited. Encouraged him to consider boxing class or other exercise.  -Educated on Cholesterol goals;  Importance of limiting foods high in cholesterol; Exercise goal of 150 minutes per week; -Counseled on diet and exercise extensively June 2023: Clarified with patient that '325mg'$  ASA is correct dose. He stated it was started on that ages ago and every doctor double checks but he's supposed to be on it  Heart Failure (Goal: manage symptoms and prevent exacerbations) -Managed by Dr. Agustin Cree, Herbie [place] -Controlled -Last ejection fraction: 55-60% (Date: 05/22) -Current treatment: carvedilol 25 mg bid with a meal Appropriate, Effective, Safe, Accessible Spironolactone 25 mg 1/2 tablet daily Appropriate, Effective, Safe, Accessible -Medications previously tried: furosemide, lisinopril-hydrochlorothiazide -Current home BP/HR readings: not checking currently  -Current dietary habits: eats meats and vegetables per patient. Eating 3 meals daily.  -Current exercise habits: very limited. Encouraged him to consider boxing class or other exercise.  -Educated on Benefits of medications for managing symptoms and prolonging life -Counseled on diet and exercise extensively Recommended to continue current medication  Arthritis (Goal: manage symptoms) -Controlled -Current treatment  acetaminophen 325 mg 2 tablets every 6 hours prn mild pain or fever - rarely takes Appropriate, Effective, Safe, Accessible -Medications previously tried:  voltaren, meloxicam, naproxen, oxycodone, tiaznidine,  pregabalin -Patient is interested in stopping Celebrex and monitoring symptoms if Dr. Tobie Poet approves.   Depression/Anxiety (Goal: manage symptoms of anxiety) -Controlled -Current treatment: duloxetine 60 mg BID Appropriate, Query effective,  Clonazepam  1 mg daily at bedtime Appropriate, Effective, Safe, Accessible Vraylar Appropriate, Effective, Safe, Query accessible -Medications previously tried/failed: abilify, bupropion, seroquel -PHQ9:     02/05/2022    9:49 AM 01/14/2022   10:09 AM 10/24/2021   10:34 AM  Depression screen PHQ 2/9  Decreased Interest 0 0 3  Down, Depressed, Hopeless '1 1 3  '$ PHQ - 2 Score '1 1 6  '$ Altered sleeping 0 0 0  Tired, decreased energy 0 2 1  Change in appetite 0 0 0  Feeling bad or failure about yourself  0 0 3  Trouble concentrating '1 2 2  '$ Moving slowly or fidgety/restless 0 0 2  Suicidal thoughts 0 0 0  PHQ-9 Score '2 5 14  '$ Difficult doing work/chores Not difficult at all Somewhat difficult Very difficult  -GAD7:     04/24/2021    9:15 AM  GAD 7 : Generalized Anxiety Score  Nervous, Anxious, on Edge 1  Control/stop worrying 1  Worry too much - different things 1  Trouble relaxing 1  Restless 0  Easily annoyed or irritable 0  Afraid - awful might happen 0  Total GAD 7 Score 4  -Educated on Benefits of medication for symptom control Feb 2023: PHQ9 is elevated and patient states he doesn't like Duloxetine, that it isn't helping. Will coordinate with PCP about changing therapy June 2023: Arman Filter is helping per patient but can't afford. Sent patient PAP a few weeks ago, he will bring into office when complete. He also stated that since starting it, he has been sleeping less (Denied vivid dreams). States he was taking HS but changed to AM and sleep pattern closer to normal.  Parkinson's Disease (Goal: improve symptoms - managed by Dr Tat) -Not Ideally Controlled -Current treatment  Sinemet IR 25-100 mg 2 TID Appropriate, Effective, Query Safe,  Accessible Sinemet CR 50-200 mg HS Appropriate, Effective, Safe, Accessible Primidone 50 mg daily at bedtime Appropriate, Effective, Safe, Accessible -Medications previously tried: none reported Feb 2023; Patient shaking more after dose increase. Will ask PCP about changing dose. Also, spent time counseling patient about going to Support Groups, explained how [employer] helped my grandpa, patient seemed very interested in this.   BPH (Goal: manage symptoms) -Controlled -Night Time Awakenings: 1/night  -Most of patient's issues are in morning -Current treatment  Alfusozin Appropriate, Query effective,  -Medications previously tried:  terazosin Feb 2023: Patient unable to take Tadalafil due to cost. Patient read about Alfusosin and would like to try that. Also, spent extensive time explaining Kegel therapy June 2023: Patient would like to go back on Tamsulosin, experiencing side effects with Alfuzosin (Stream starts and stops)   Patient Goals/Self-Care Activities Patient will:  - take medications as prescribed focus on medication adherence by using pill box  target a minimum of 150 minutes of moderate intensity exercise weekly  Follow Up Plan: Telephone follow up appointment with care management team member scheduled for: Jan 2024  [place] Constable, Pharm.D. - [phone]      Mr. Bellantoni was given information about Chronic Care Management services today including:  CCM service includes personalized support from designated clinical staff supervised by his physician, including individualized plan of care and coordination with other care providers 24/7 contact phone numbers for assistance for urgent and routine care needs. Standard insurance, coinsurance, copays and deductibles apply for chronic care management only during months in which we provide at least 20 minutes of these services. Most insurances cover these services at 100%, however patients may be  responsible for any copay,  coinsurance and/or deductible if applicable. This service may help you avoid the need for more expensive face-to-face services. Only one practitioner may furnish and bill the service in a calendar month. The patient may stop CCM services at any time (effective at the end of the month) by phone call to the office staff.  Patient agreed to services and verbal consent obtained.   The patient verbalized understanding of instructions, educational materials, and care plan provided today and DECLINED offer to receive copy of patient instructions, educational materials, and care plan.  The pharmacy team will reach out to the patient again over the next 60 days.   Lane Hacker, [place]

## 2022-02-20 NOTE — Progress Notes (Cosign Needed)
Chronic Care Management Pharmacy Note  02/20/2022 Name:  SHLOK RAZ MRN:  [id number] DOB:  December 11, 1945  Summary:  Pleasant 76 year old male presents for F/U CCM visit. He retired as a Animal nutritionist. He likes playing card games on his computer along with some mind puzzles on the [employer] website. His son, [name], passed in Sept 2022. He lived outside of [facility] so he still visits his DIL and 2 grandkids who still live there. Patient's sister lives in [place] so he'll visit her there as well too. Patient works out during Financial pl[employer] while watching his favourite TV shows.  Recommendations/Changes made from today's visit: Patient content on Vraylar, will bring in PAP this week so we can fax out    Subjective: KAEL KEETCH is an 76 y.o. year old male who is a primary patient of Cox, Kirsten, MD.  The CCM team was consulted for assistance with disease management and care coordination needs.    Engaged with patient by telephone for follow up visit in response to provider referral for pharmacy case management and/or care coordination services.   Consent to Services:  The patient was given the following information about Chronic Care Management services today, agreed to services, and gave verbal consent: 1. CCM service includes personalized support from designated clinical staff supervised by the primary care provider, including individualized plan of care and coordination with other care providers 2. 24/7 contact phone numbers for assistance for urgent and routine care needs. 3. Service will only be billed when office clinical staff spend 20 minutes or more in a month to coordinate care. 4. Only one practitioner may furnish and bill the service in a calendar month. 5.The patient may stop CCM services at any time (effective at the end of the month) by phone call to the office staff. 6. The patient will be responsible for cost sharing (co-pay) of up to 20% of the  service fee (after annual deductible is met). Patient agreed to services and consent obtained.  Patient Care Team: Rochel Brome, MD as PCP - General (Internal Medicine) Tat, Eustace Quail, DO as Consulting Physician (Neurology) Park Liter, MD as Consulting Physician (Cardiology) Starling Manns, MD (Orthopedic Surgery) Lane Hacker, [facility] (Pharmacist)    Recent office visits:  04/10/21- Rochel Brome, MD- seen to establish care/ parkinson's increased duloxetine from 30 mg daily to 60 mg daily, labs ordered, follow up 2 weeks   Recent consult visits:  04/09/21- Cyndy Freeze, MD (Cardiology)-seen for follow-up status post  aortic valve replacement bioprosthetic aortic valve , labs ordered, ZIO patch ordered, no medication changes, follow up 3 months 03/21/21- Landis Martins, DPM (Podiatry)- seen for pain due to onychomycosis of toenails of both feet, debridement performed, no medication changes, follow up 3 months 02/23/21- Wells Guiles Tat, DO (Neurology Video Visit)- seen for parkinson's with tremor, started levodopa 25/100 with titration schedule, referral to cardiology, follow up as scheduled 01/23/21- Orders Only (Cardiology)- 30 Day Cardiac Event Monitor to evaluate CVA 01/18/21- Wells Guiles Tat, DO (Neurology)- seen for parkinson's with tremor, echocardiogram ordered, [place] ordered, no medication changes, no follow up documented   Hospital visits:  Medication Reconciliation was completed by comparing discharge summary, patient's EMR and Pharmacy list, and upon discussion with patient.   Admitted to the hospital on 01/19/21 due to UTI. Discharge date was 01/24/21. Discharged from [place]?Medications Started at [facility] Discharge:?? cephALEXin 500 mg x 4 days cyanocobalamin 1000 mcg/ml  every 30 days feeding supplement 237 ml tid between meals   Medications Discontinued at Hospital Discharge: tadalafil 5 MG    All other medications  remain the same after  Hospital Discharge:??      2. 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 Discharge date was 12/20/20.Marland Kitchen Discharged from [place]?Medications Started at [facility] Discharge:?? Cyanocobalamin 1000 mcg/ ml daily x 15 days folic acid 1 mg daily   Medications Discontinued at Hospital Discharge: ARIPiprazole 10 MG due to induced parkinson's   All other medications remain the same after Hospital Discharge:??    Objective:  Lab Results  Component Value Date   CREATININE 0.83 01/14/2022   BUN 17 01/14/2022   GFRNONAA >60 01/22/2021   GFRAA >60 05/15/2018   NA 140 01/14/2022   K 4.3 01/14/2022   CALCIUM 9.2 01/14/2022   CO2 24 01/14/2022   GLUCOSE 92 01/14/2022    No results found for: "HGBA1C", "FRUCTOSAMINE", "GFR", "MICROALBUR"  Last diabetic Eye exam: No results found for: "HMDIABEYEEXA"  Last diabetic Foot exam: No results found for: "HMDIABFOOTEX"   Lab Results  Component Value Date   CHOL 99 (L) 01/14/2022   HDL 48 01/14/2022   LDLCALC 38 01/14/2022   TRIG 54 01/14/2022   CHOLHDL 2.1 01/14/2022       Latest Ref Rng & Units 01/14/2022   10:22 AM 09/11/2021   10:52 AM 06/21/2021    1:45 PM  Hepatic Function  Total Protein 6.0 - 8.5 g/dL 6.7  6.4  6.9   Albumin 3.7 - 4.7 g/dL 4.0  4.0  3.9   AST 0 - 40 IU/L _0 ALT 0 - 44 IU/L _1 Alk Phosphatase 44 - 121 IU/L 90  80  86   Total Bilirubin 0.0 - 1.2 mg/dL 0.5  0.5  0.4     Lab Results  Component Value Date/Time   TSH 1.280 01/14/2022 10:22 AM   TSH 1.240 04/19/2021 10:06 AM       Latest Ref Rng & Units 01/14/2022   10:22 AM 09/11/2021   10:52 AM 06/21/2021    1:45 PM  CBC  WBC 3.4 - 10.8 x10E3/uL 6.9  6.5  6.5   Hemoglobin 13.0 - 17.7 g/dL 13.8  13.3  12.7   Hematocrit 37.5 - 51.0 % 40.1  38.9  37.5   Platelets 150 - 450 x10E3/uL 182  173  240     Lab Results  Component Value Date/Time   VD25OH 62.5 01/14/2022 10:22 AM   VD25OH  53.9 04/19/2021 10:06 AM    Clinical ASCVD: Yes  The ASCVD Risk score (Arnett DK, et al., 2019) failed to calculate for the following reasons:   The patient has a prior MI or stroke diagnosis       02/05/2022    9:49 AM 01/14/2022   10:09 AM 10/24/2021   10:34 AM  Depression screen PHQ 2/9  Decreased Interest 0 0 3  Down, Depressed, Hopeless _2 PHQ - 2 Score _3 Altered sleeping 0 0 0  Tired, decreased energy 0 2 1  Change in appetite 0 0 0  Feeling bad or failure about yourself  0 0 3  Trouble concentrating _4 Moving slowly or fidgety/restless 0 0 2  Suicidal thoughts 0 0 0  PHQ-9 Score _5 Difficult  doing work/chores Not difficult at all Somewhat difficult Very difficult     Other: (CHADS2VASc if Afib, MMRC or CAT for COPD, ACT, DEXA)  Social History   Tobacco Use  Smoking Status Some Days   Packs/day: 0.10   Years: 50.00   Total pack years: 5.00   Types: Cigarettes   Last attempt to quit: 05/2018   Years since quitting: 3.8  Smokeless Tobacco Never   BP Readings from Last 3 Encounters:  02/05/22 (!) 142/84  01/14/22 (!) 142/80  11/29/21 (!) 143/91   Pulse Readings from Last 3 Encounters:  02/05/22 64  01/14/22 67  11/29/21 63   Wt Readings from Last 3 Encounters:  02/05/22 187 lb (84.8 kg)  01/14/22 182 lb (82.6 kg)  11/29/21 191 lb 3.2 oz (86.7 kg)   BMI Readings from Last 3 Encounters:  02/05/22 24.01 kg/m  01/14/22 23.37 kg/m  11/29/21 24.55 kg/m    Assessment/Interventions: Review of patient past medical history, allergies, medications, health status, including review of consultants reports, laboratory and other test data, was performed as part of comprehensive evaluation and provision of chronic care management services.   SDOH:  (Social Determinants of Health) assessments and interventions performed: Yes SDOH Interventions    Flowsheet Row Most Recent Value  SDOH Interventions   Financial Strain Interventions Other (Comment)   [Vraylar PAP]  Transportation Interventions Intervention Not Indicated      SDOH Screenings   Alcohol Screen: Low Risk  (08/10/2021)   Alcohol Screen    Last Alcohol Screening Score (AUDIT): 0  Depression (PHQ2-9): Low Risk  (02/05/2022)   Depression (PHQ2-9)    PHQ-2 Score: 2  Recent Concern: Depression (PHQ2-9) - Medium Risk (01/14/2022)   Depression (PHQ2-9)    PHQ-2 Score: 5  Financial Resource Strain: Medium Risk (02/20/2022)   Overall Financial Resource Strain (CARDIA)    Difficulty of Paying Living Expenses: Somewhat hard  Food Insecurity: No Food Insecurity (08/10/2021)   Hunger Vital Sign    Worried About Running Out of Food in the Last Year: Never true    Ran Out of Food in the Last Year: Never true  Housing: Low Risk  (04/24/2021)   Housing    Last Housing Risk Score: 0  Physical Activity: Sufficiently Active (08/10/2021)   Exercise Vital Sign    Days of Exercise per Week: 7 days    Minutes of Exercise per Session: 60 min  Social Connections: Moderately Isolated (08/10/2021)   Social Connection and Isolation Panel [NHANES]    Frequency of Communication with Friends and Family: More than three times a week    Frequency of Social Gatherings with Friends and Family: Three times a week    Attends Religious Services: Never    Active Member of Clubs or Organizations: No    Attends Archivist Meetings: Never    Marital Status: Living with partner  Stress: No Stress Concern Present (08/10/2021)   [place]    Feeling of Stress : Not at all  Tobacco Use: High Risk (02/05/2022)   Patient History    Smoking Tobacco Use: Some Days    Smokeless Tobacco Use: Never    Passive Exposure: Not on file  Transportation Needs: No Transportation Needs (02/20/2022)   PRAPARE - Transportation    Lack of Transportation (Medical): No    Lack of Transportation (Non-Medical): No    CCM Care Plan  No Known  Allergies  Medications Reviewed Today  Reviewed by Lane Hacker, [place] (Pharmacist) on 02/20/22 at 39  Med List Status: <None>   Medication Order Taking? Sig Documenting Provider Last Dose Status Informant  acetaminophen (TYLENOL) 325 MG tablet [id number]  Take 2 tablets (650 mg total) by mouth every 6 (six) hours as needed for mild pain (or Fever >/= 101). Elgergawy, Silver Huguenin, MD  Active   albuterol (VENTOLIN HFA) 108 (90 Base) MCG/ACT inhaler [id number]  Inhale 1-2 puffs into the lungs every 6 (six) hours as needed for wheezing or shortness of breath. [provider]  Active   alfuzosin (UROXATRAL) 10 MG 24 hr tablet [id number] No Take 1 tablet (10 mg total) by mouth daily with breakfast. Discontinue tamsulosin.  Patient not taking: Reported on 02/20/2022   CoxElnita Maxwell, MD Not Taking Consider Medication Status and Discontinue   aspirin EC 325 MG EC tablet [id number]  Take 1 tablet (325 mg total) by mouth daily. Barrett, Lodema Hong, PA-C  Active Self  atorvastatin (LIPITOR) 20 MG tablet [id number]  TAKE 1 TABLET BY MOUTH DAILY Cox, Kirsten, MD  Active   carbidopa-levodopa (SINEMET CR) 50-200 MG tablet [id number]  Take 1 tablet by mouth at bedtime. Ludwig Clarks, DO  Active   carbidopa-levodopa (SINEMET IR) 25-100 MG tablet [id number]  Take 2 tablets by mouth 3 (three) times daily. 9am/1pm/5pm Tat, Eustace Quail, DO  Active   cariprazine (VRAYLAR) 1.5 MG capsule [id number]  Take 1 capsule (1.5 mg total) by mouth daily. Cox, Kirsten, MD  Active   carvedilol (COREG) 25 MG tablet [id number]  Take 1 tablet (25 mg total) by mouth 2 (two) times daily with a meal. Cox, Kirsten, MD  Active   clonazePAM (KLONOPIN) 1 MG tablet [id number]  TAKE 1 TABLET BY MOUTH NIGHTLY AND 1 DAILY AS NEEDED FOR ANXIETY Cox, Kirsten, MD  Active   Cyanocobalamin 1000 MCG SUBL [id number]  Place 1,000 mcg under the tongue daily. [provider]  Active   diclofenac (VOLTAREN) 75 MG EC tablet [id number]  Take 75 mg by  mouth 2 (two) times daily. [provider]  Active   DULoxetine (CYMBALTA) 60 MG capsule [id number]  TAKE 1 CAPSULE BY MOUTH TWICE  DAILY Cox, Kirsten, MD  Active   fluticasone (FLONASE) 50 MCG/ACT nasal spray [id number]  Place 1 spray into both nostrils 3 times/day as needed-between meals & bedtime. Rochel Brome, MD  Active   Melatonin 10 MG CAPS [id number]  Take 5 mg by mouth at bedtime. [provider]  Active Self  montelukast (SINGULAIR) 10 MG tablet [id number]  TAKE 1 TABLET BY MOUTH AT  BEDTIME Marge Duncans, PA-C  Active   spironolactone (ALDACTONE) 25 MG tablet [id number]  TAKE ONE-HALF TABLET BY MOUTH  DAILY Rip Harbour, NP  Active   Vitamin D, Ergocalciferol, (DRISDOL) 1.25 MG (50000 UNIT) CAPS capsule [id number]  TAKE 1 CAPSULE BY MOUTH EVERY  Samuel Jester, MD  Active   Zinc 50 MG TABS [id number]  Take 50 mg by mouth at bedtime. [provider]  Active Self            Patient Active Problem List   Diagnosis Date Noted   Moderate recurrent major depression ([place]) 01/14/2022   B12 deficiency 09/29/2021   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   Atherosclerosis of aorta ([place]) 09/29/2021   S/p left hip fracture 06/23/2021   PFO (patent foramen ovale)    Vitamin D deficiency 04/02/2021   Scoliosis of lumbosacral spine 04/02/2021   Rectal  prolapse 04/02/2021   Mild dilation of ascending aorta ([place]) 04/02/2021   DDD (degenerative disc disease), cervical 04/02/2021   Cubital tunnel syndrome 04/02/2021   Arthritis 04/02/2021   Aortic valve sclerosis 04/02/2021   Hypertensive heart disease with chronic diastolic congestive heart failure ([place]) 04/02/2021   Parkinson's disease ([place]) 02/23/2021   Depression    History of cardioembolic cerebrovascular accident (CVA)    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   S/P aortic valve replacement with bioprosthetic valve 05/01/2018   Chronic diastolic CHF (congestive heart failure) ([place])  04/19/2018   BPH (benign prostatic hyperplasia) 04/19/2018   Anxiety 04/19/2018   Mixed hyperlipidemia 09/23/2016    Immunization History  Administered Date(s) Administered   Influenza, High Dose Seasonal PF 06/16/2016   Influenza,inj,quad, With Preservative 05/24/2019   Influenza-Unspecified 06/29/2021   PFIZER(Purple Top)SARS-COV-2 Vaccination 11/01/2019, 11/30/2019   Pfizer Covid-19 Vaccine Bivalent Booster 85yr & up 06/21/2021   Pneumococcal Polysaccharide-23 08/07/2021   Zoster Recombinat (Shingrix) 11/24/2017, 04/02/2018    Conditions to be addressed/monitored:  Hypertension, Hyperlipidemia, Heart Failure, Anxiety, BPH, and Parkinson's disease  Care Plan : ccm pharmacy care plan  Updates made by KLane Hacker RHarpersince 02/20/2022 12:00 AM     Problem: anxiety, bp, hld, heart failure   Priority: High  Onset Date: 04/24/2021     Long-Range Goal: Disease State Managment   Start Date: 04/24/2021  Expected End Date: 04/24/2022  Recent Progress: On track  Priority: High  Note:   Current Barriers:  Unable to maintain control of cholesterol  Pharmacist Clinical Goal(s):  Patient will maintain control of cholesterol as evidenced by lipid panel  through collaboration with PharmD and provider.   Interventions: 1:1 collaboration with CRochel Brome MD regarding development and update of comprehensive plan of care as evidenced by provider attestation and co-signature Inter-disciplinary care team collaboration (see longitudinal plan of care) Comprehensive medication review performed; medication list updated in electronic medical record  Hypertension (BP goal <140/90) -Managed by Dr. KAgustin Cree Robert BP Readings from Last 3 Encounters:  02/05/22 (!) 142/84  01/14/22 (!) 142/80  11/29/21 (!) 143/91  -Controlled -Current treatment: Carvedilol 25 mg bid with a meal Appropriate, Effective, Safe, Accessible Spironolactone 25 mg take 1/2 daily Appropriate, Effective, Safe,  Accessible -Medications previously tried: furosemide, lisinopril-hydrochlorothiazide -Current home readings: not checking often -Current dietary habits: eats meats and vegetables per patient. Eating 3 meals daily.  -Current exercise habits: very limited. Encouraged him to consider boxing class or other exercise.  -Denies hypotensive/hypertensive symptoms -Educated on BP goals and benefits of medications for prevention of heart attack, stroke and kidney damage; Daily salt intake goal < 2300 mg; Exercise goal of 150 minutes per week; -Counseled to monitor BP at home as needed, document, and provide log at future appointments -Counseled on diet and exercise extensively Recommended to continue current medication  CAD: (LDL goal < 70) -Not ideally controlled -Current treatment: ASA 3226mAppropriate, Effective, Safe, Accessible Atorvastatian 2073mppropriate, Effective, Safe, Accessible -Medications previously tried: patient has never been on statin  -Current dietary habits: eats meats and vegetables per patient. Eating 3 meals daily.  -Current exercise habits: very limited. Encouraged him to consider boxing class or other exercise.  -Educated on Cholesterol goals;  Importance of limiting foods high in cholesterol; Exercise goal of 150 minutes per week; -Counseled on diet and exercise extensively June 2023: Clarified with patient that 325m40mA is correct dose. He stated it was started on that ages ago and every doctor double checks but he's  supposed to be on it  Heart Failure (Goal: manage symptoms and prevent exacerbations) -Managed by Dr. Agustin Cree, Herbie [place] -Controlled -Last ejection fraction: 55-60% (Date: 05/22) -Current treatment: carvedilol 25 mg bid with a meal Appropriate, Effective, Safe, Accessible Spironolactone 25 mg 1/2 tablet daily Appropriate, Effective, Safe, Accessible -Medications previously tried: furosemide, lisinopril-hydrochlorothiazide -Current home BP/HR  readings: not checking currently  -Current dietary habits: eats meats and vegetables per patient. Eating 3 meals daily.  -Current exercise habits: very limited. Encouraged him to consider boxing class or other exercise.  -Educated on Benefits of medications for managing symptoms and prolonging life -Counseled on diet and exercise extensively Recommended to continue current medication  Arthritis (Goal: manage symptoms) -Controlled -Current treatment  acetaminophen 325 mg 2 tablets every 6 hours prn mild pain or fever - rarely takes Appropriate, Effective, Safe, Accessible -Medications previously tried:  voltaren, meloxicam, naproxen, oxycodone, tiaznidine, pregabalin -Patient is interested in stopping Celebrex and monitoring symptoms if Dr. Tobie Poet approves.   Depression/Anxiety (Goal: manage symptoms of anxiety) -Controlled -Current treatment: duloxetine 60 mg BID Appropriate, Query effective,  Clonazepam 1 mg daily at bedtime Appropriate, Effective, Safe, Accessible Vraylar Appropriate, Effective, Safe, Query accessible -Medications previously tried/failed: abilify, bupropion, seroquel -PHQ9:     02/05/2022    9:49 AM 01/14/2022   10:09 AM 10/24/2021   10:34 AM  Depression screen PHQ 2/9  Decreased Interest 0 0 3  Down, Depressed, Hopeless _0 PHQ - 2 Score _1 Altered sleeping 0 0 0  Tired, decreased energy 0 2 1  Change in appetite 0 0 0  Feeling bad or failure about yourself  0 0 3  Trouble concentrating _2 Moving slowly or fidgety/restless 0 0 2  Suicidal thoughts 0 0 0  PHQ-9 Score _3 Difficult doing work/chores Not difficult at all Somewhat difficult Very difficult  -GAD7:     04/24/2021    9:15 AM  GAD 7 : Generalized Anxiety Score  Nervous, Anxious, on Edge 1  Control/stop worrying 1  Worry too much - different things 1  Trouble relaxing 1  Restless 0  Easily annoyed or irritable 0  Afraid - awful might happen 0  Total GAD 7 Score 4  -Educated on  Benefits of medication for symptom control Feb 2023: PHQ9 is elevated and patient states he doesn't like Duloxetine, that it isn't helping. Will coordinate with PCP about changing therapy June 2023: Arman Filter is helping per patient but can't afford. Sent patient PAP a few weeks ago, he will bring into office when complete. He also stated that since starting it, he has been sleeping less (Denied vivid dreams). States he was taking HS but changed to AM and sleep pattern closer to normal.  Parkinson's Disease (Goal: improve symptoms - managed by Dr Tat) -Not Ideally Controlled -Current treatment  Sinemet IR 25-100 mg 2 TID Appropriate, Effective, Query Safe, Accessible Sinemet CR 50-200 mg HS Appropriate, Effective, Safe, Accessible Primidone 50 mg daily at bedtime Appropriate, Effective, Safe, Accessible -Medications previously tried: none reported Feb 2023; Patient shaking more after dose increase. Will ask PCP about changing dose. Also, spent time counseling patient about going to Support Groups, explained how [employer] helped my grandpa, patient seemed very interested in this.   BPH (Goal: manage symptoms) -Controlled -Night Time Awakenings: 1/night  -Most of patient's issues are in morning -Current treatment  Alfusozin Appropriate, Query effective,  -Medications previously tried:  terazosin Feb 2023: Patient unable to take  Tadalafil due to cost. Patient read about Alfusosin and would like to try that. Also, spent extensive time explaining Kegel therapy June 2023: Patient would like to go back on Tamsulosin, experiencing side effects with Alfuzosin (Stream starts and stops)   Patient Goals/Self-Care Activities Patient will:  - take medications as prescribed focus on medication adherence by using pill box  target a minimum of 150 minutes of moderate intensity exercise weekly  Follow Up Plan: Telephone follow up appointment with care management team member scheduled for: Jan  2024  Ari[place] Constable, Pharm.D. - [phone]        Medication Assistance: None required.  Patient affirms current coverage meets needs.  Compliance/Adherence/Medication fill history: Care Gaps: Last annual wellness visit? Upcoming 94/49/67 If applicable: N/A Last eye exam / retinopathy screening? Last diabetic foot exam?  Star-Rating Drugs: None noted  Patient's preferred pharmacy is:  [facility] ([place], [address] [address] Phone: [phone] Fax: [phone]  [place], [address] [place] [address] Phone: [phone] Fax: [phone]  [facility] ([facility] ) - [place] [place] [address] Phone: [phone] Fax: [phone]  [facility] - [address], [address] [place] [address] Phone: [phone] Fax: [phone]   Uses pill box? Yes Pt endorses good compliance  We discussed: Current pharmacy is preferred with insurance plan and patient is satisfied with pharmacy services Patient decided to: Continue current medication management strategy  Care Plan and Follow Up Patient Decision:  Patient agrees to Care Plan and Follow-up.  Plan: Telephone follow up appointment with care management team member scheduled for:  Jan 2024  Arizona Constable, [place].D. - [phone]

## 2022-02-27 ENCOUNTER — Telehealth: Payer: Self-pay

## 2022-02-27 NOTE — Progress Notes (Signed)
Reached out to pt today after leaving [name] a message and answered all this questions.  Travis Reese, [place] Pharmacist Assistant  [phone]

## 2022-02-28 ENCOUNTER — Other Ambulatory Visit: Payer: Self-pay | Admitting: Family Medicine

## 2022-02-28 MED ORDER — CARIPRAZINE HCL 1.5 MG PO CAPS: 1.5000 mg | ORAL_CAPSULE | Freq: Every day | ORAL | 0 refills | Status: DC

## 2022-03-01 DIAGNOSIS — I2510 Atherosclerotic heart disease of native coronary artery without angina pectoris: Secondary | ICD-10-CM | POA: Diagnosis not present

## 2022-03-01 DIAGNOSIS — I509 Heart failure, unspecified: Secondary | ICD-10-CM | POA: Diagnosis not present

## 2022-03-01 DIAGNOSIS — G20 Parkinson's disease: Secondary | ICD-10-CM

## 2022-03-01 DIAGNOSIS — I110 Hypertensive heart disease with heart failure: Secondary | ICD-10-CM | POA: Diagnosis not present

## 2022-03-01 DIAGNOSIS — F32A Depression, unspecified: Secondary | ICD-10-CM | POA: Diagnosis not present

## 2022-03-01 DIAGNOSIS — N400 Enlarged prostate without lower urinary tract symptoms: Secondary | ICD-10-CM

## 2022-03-22 ENCOUNTER — Telehealth: Payer: Self-pay

## 2022-03-22 ENCOUNTER — Other Ambulatory Visit: Payer: Self-pay

## 2022-03-22 ENCOUNTER — Telehealth: Payer: Self-pay | Admitting: Neurology

## 2022-03-22 DIAGNOSIS — G20 Parkinson's disease: Secondary | ICD-10-CM

## 2022-03-22 MED ORDER — CARBIDOPA-LEVODOPA ER 50-200 MG PO TBCR: 1.0000 | EXTENDED_RELEASE_TABLET | Freq: Every day | ORAL | 1 refills | Status: DC

## 2022-03-22 NOTE — Telephone Encounter (Signed)
Patient having trouble getting his carbidopa levodopa and 50/200 due to change in insurance [employer] ) I have sent prescription to [facility] to help with the cost

## 2022-03-22 NOTE — Telephone Encounter (Signed)
Called pateint and left voicemail message  

## 2022-03-22 NOTE — Telephone Encounter (Signed)
Pt would like a call back from chelsea regarding a medication. Travis Reese he wants to speak with her.

## 2022-03-22 NOTE — Chronic Care Management (AMB) (Signed)
    Chronic Care Management Pharmacy Assistant   Name: Travis Reese  MRN: [id number] DOB: 1946/05/26  Reason for Encounter: Patient Assistance Coordination  Patient called asking if there was a generic medication to Carbidopa- Levadopa CR  50/200 mg due to the insurance he will be switching to will not cover. Patient states he will be switching from [name] to [name] and their pharmacy does not cover this medication. Informed patient that Carbidopa-Levadopa CR was already the generic form of this medication and brand name is Sinemet. Patient states it will cost him over $50 a month to receive when he switches insurance and wasn't sure what he should do. Informed patient that there is a patient assitance program called RXOutreach that could possible assist him with this medication cost but he will need to take application to Travis Reese his Neurologist to sign and for their office to fax due to her being the prescriber of this medications. Patient aware and agrees to doing that, he is aware I will place application in the mail and once he receives he will fill out an take to Travis Reese office for completion. Filling out RxOutreach application for Carbidopa-Levadopa Cr 50/200 mg.  Medications: Outpatient Encounter Medications as of 03/22/2022  Medication Sig   acetaminophen (TYLENOL) 325 MG tablet Take 2 tablets (650 mg total) by mouth every 6 (six) hours as needed for mild pain (or Fever >/= 101).   albuterol (VENTOLIN HFA) 108 (90 Base) MCG/ACT inhaler Inhale 1-2 puffs into the lungs every 6 (six) hours as needed for wheezing or shortness of breath.   alfuzosin (UROXATRAL) 10 MG 24 hr tablet Take 1 tablet (10 mg total) by mouth daily with breakfast. Discontinue tamsulosin. (Patient not taking: Reported on 02/20/2022)   aspirin EC 325 MG EC tablet Take 1 tablet (325 mg total) by mouth daily.   atorvastatin (LIPITOR) 20 MG tablet TAKE 1 TABLET BY MOUTH DAILY   carbidopa-levodopa (SINEMET  CR) 50-200 MG tablet Take 1 tablet by mouth at bedtime.   carbidopa-levodopa (SINEMET IR) 25-100 MG tablet Take 2 tablets by mouth 3 (three) times daily. 9am/1pm/5pm   cariprazine (VRAYLAR) 1.5 MG capsule Take 1 capsule (1.5 mg total) by mouth daily.   carvedilol (COREG) 25 MG tablet Take 1 tablet (25 mg total) by mouth 2 (two) times daily with a meal.   clonazePAM (KLONOPIN) 1 MG tablet TAKE 1 TABLET BY MOUTH NIGHTLY AND 1 DAILY AS NEEDED FOR ANXIETY   Cyanocobalamin 1000 MCG SUBL Place 1,000 mcg under the tongue daily.   diclofenac (VOLTAREN) 75 MG EC tablet Take 75 mg by mouth 2 (two) times daily.   DULoxetine (CYMBALTA) 60 MG capsule TAKE 1 CAPSULE BY MOUTH TWICE  DAILY   fluticasone (FLONASE) 50 MCG/ACT nasal spray Place 1 spray into both nostrils 3 times/day as needed-between meals & bedtime.   Melatonin 10 MG CAPS Take 5 mg by mouth at bedtime.   montelukast (SINGULAIR) 10 MG tablet TAKE 1 TABLET BY MOUTH AT  BEDTIME   spironolactone (ALDACTONE) 25 MG tablet TAKE ONE-HALF TABLET BY MOUTH  DAILY   Vitamin D, Ergocalciferol, (DRISDOL) 1.25 MG (50000 UNIT) CAPS capsule TAKE 1 CAPSULE BY MOUTH EVERY  MONDAY   Zinc 50 MG TABS Take 50 mg by mouth at bedtime.   No facility-administered encounter medications on file as of 03/22/2022.    Travis Reese, [place] Pharmacist Assistant [phone]

## 2022-03-22 NOTE — Telephone Encounter (Signed)
Called and spoke with patient. He doesn't not know the exact insurance of [employer]. He will call his agent, get the number, and let us know.

## 2022-03-26 ENCOUNTER — Telehealth: Payer: Self-pay

## 2022-03-26 ENCOUNTER — Other Ambulatory Visit: Payer: Self-pay | Admitting: Family Medicine

## 2022-03-26 NOTE — Telephone Encounter (Signed)
-----   Message from Juliane Poot sent at 03/26/2022  2:27 PM EDT ----- Regarding: Samples Good afternoon,  Patient is wonder if you all have any cariprazine (VRAYLAR) 1.5 MG capsule samples? If you could contact him if there are some available.  Thank you  Pattricia Boss, [place] Pharmacist Assistant [phone]

## 2022-03-26 NOTE — Telephone Encounter (Signed)
Patient called inquiring to see if we have any Vraylar samples. We currently dont have samples, so called the patient and left message to inform patient that we dont have any samples currently.

## 2022-03-26 NOTE — Chronic Care Management (AMB) (Signed)
Follow up:  Spoke with patient regarding new insurance he will be getting soon, patient states he will be getting [employer]. While on the phone patient Patient also requested samples of cariprazine (VRAYLAR) 1.5 MG capsule. Patient aware I will send a message to the Clinical team at Dr Cox office to check for samples and call him if any available. Patient also mentioned that his Neurologist Dr Tat has some information on how he can get Carbidopa-Levadopa at a low to no cost with [employer] and their office will assist with that. Patient will hold on to patient assistance application that was mailed to him just in case he needs it.  Pattricia Boss, [place] Pharmacist Assistant [phone]

## 2022-03-29 NOTE — Telephone Encounter (Signed)
Patient getting Carb/Leva from [employer]. He just wanted me to know.

## 2022-04-03 NOTE — Progress Notes (Signed)
Pt has been informed his RX outreach application for Carb-Levo needs to go to his neurologist. Pt understood.  Elray Mcgregor, [place] Pharmacist Assistant  [phone]

## 2022-04-04 ENCOUNTER — Telehealth: Payer: Self-pay | Admitting: Neurology

## 2022-04-04 ENCOUNTER — Telehealth: Payer: Self-pay

## 2022-04-04 ENCOUNTER — Other Ambulatory Visit: Payer: Self-pay | Admitting: Family Medicine

## 2022-04-04 DIAGNOSIS — G20 Parkinson's disease: Secondary | ICD-10-CM

## 2022-04-04 MED ORDER — CELECOXIB 200 MG PO CAPS: 200.0000 mg | ORAL_CAPSULE | Freq: Every day | ORAL | 1 refills | Status: DC

## 2022-04-04 MED ORDER — CARBIDOPA-LEVODOPA ER 50-200 MG PO TBCR: 1.0000 | EXTENDED_RELEASE_TABLET | Freq: Every day | ORAL | 0 refills | Status: DC

## 2022-04-04 NOTE — Telephone Encounter (Signed)
Patient would like a call back from heather since chelsea isnt here. He has a few things he wants to talk about

## 2022-04-04 NOTE — Telephone Encounter (Signed)
Pt called Carbidopa levodopa now has a $0 dollar copay at [facility] for the 50-200 and was needing a refill and asking to be [facility] of [facility] [place]

## 2022-04-04 NOTE — Chronic Care Management (AMB) (Signed)
    Chronic Care Management Pharmacy Assistant   Name: Travis Reese  MRN: [id number] DOB: 03/19/1946   Reason for Encounter: Medication Review  Patient called stating with his new insurance that will start in October will let him use [place] to receive [place] for $0 copay.  Patient also would like to get a refill of Celebrex 100 mg, explained to patient that because there was not an active prescription on file this would have to be authorized by Dr Tobie Poet. Patient informed me he spoke with a [place] and they told him the Celebrex would be better than the diclofenac and he would need to stop it if Celebrex was approved. Patient aware I will get a message to Dr Tobie Poet and [place] Constable, Pharm D and the office will call him back if medication is approved.   Medications: Outpatient Encounter Medications as of 04/04/2022  Medication Sig   acetaminophen (TYLENOL) 325 MG tablet Take 2 tablets (650 mg total) by mouth every 6 (six) hours as needed for mild pain (or Fever >/= 101).   albuterol (VENTOLIN HFA) 108 (90 Base) MCG/ACT inhaler Inhale 1-2 puffs into the lungs every 6 (six) hours as needed for wheezing or shortness of breath.   alfuzosin (UROXATRAL) 10 MG 24 hr tablet TAKE 1 TABLET BY MOUTH DAILY  WITH BREAKFAST DISCONTINUE  TAMSULOSIN   aspirin EC 325 MG EC tablet Take 1 tablet (325 mg total) by mouth daily.   atorvastatin (LIPITOR) 20 MG tablet TAKE 1 TABLET BY MOUTH DAILY   carbidopa-levodopa (SINEMET CR) 50-200 MG tablet Take 1 tablet by mouth at bedtime.   carbidopa-levodopa (SINEMET IR) 25-100 MG tablet Take 2 tablets by mouth 3 (three) times daily. 9am/1pm/5pm   cariprazine (VRAYLAR) 1.5 MG capsule Take 1 capsule (1.5 mg total) by mouth daily.   carvedilol (COREG) 25 MG tablet Take 1 tablet (25 mg total) by mouth 2 (two) times daily with a meal.   clonazePAM (KLONOPIN) 1 MG tablet TAKE 1 TABLET BY MOUTH NIGHTLY AND 1 DAILY AS NEEDED  FOR ANXIETY   Cyanocobalamin 1000 MCG SUBL Place 1,000 mcg under the tongue daily.   diclofenac (VOLTAREN) 75 MG EC tablet Take 75 mg by mouth 2 (two) times daily.   DULoxetine (CYMBALTA) 60 MG capsule TAKE 1 CAPSULE BY MOUTH TWICE  DAILY   fluticasone (FLONASE) 50 MCG/ACT nasal spray Place 1 spray into both nostrils 3 times/day as needed-between meals & bedtime.   Melatonin 10 MG CAPS Take 5 mg by mouth at bedtime.   montelukast (SINGULAIR) 10 MG tablet TAKE 1 TABLET BY MOUTH AT  BEDTIME   spironolactone (ALDACTONE) 25 MG tablet TAKE ONE-HALF TABLET BY MOUTH  DAILY   Vitamin D, Ergocalciferol, (DRISDOL) 1.25 MG (50000 UNIT) CAPS capsule TAKE 1 CAPSULE BY MOUTH EVERY  MONDAY   Zinc 50 MG TABS Take 50 mg by mouth at bedtime.   No facility-administered encounter medications on file as of 04/04/2022.    Pattricia Boss, [place] Pharmacist Assistant [phone]

## 2022-04-08 ENCOUNTER — Other Ambulatory Visit: Payer: Self-pay | Admitting: Family Medicine

## 2022-04-08 ENCOUNTER — Other Ambulatory Visit: Payer: Self-pay

## 2022-04-08 MED ORDER — CLONAZEPAM 1 MG PO TABS: ORAL_TABLET | ORAL | 1 refills | Status: DC

## 2022-04-09 ENCOUNTER — Telehealth: Payer: Self-pay

## 2022-04-09 NOTE — Progress Notes (Signed)
Chronic Care Management Pharmacy Assistant   Name: Travis Reese  MRN: [id number] DOB: 28-Apr-1946  Reason for Encounter: General Adherence Call    Recent office visits:  04/04/22 Rochel Brome MD. D/C Voltaren '75mg'$ . Started Celebrex '200mg'$ .   Recent consult visits:  None  Hospital visits:  None  Medications: Outpatient Encounter Medications as of 04/09/2022  Medication Sig   acetaminophen (TYLENOL) 325 MG tablet Take 2 tablets (650 mg total) by mouth every 6 (six) hours as needed for mild pain (or Fever >/= 101).   albuterol (VENTOLIN HFA) 108 (90 Base) MCG/ACT inhaler Inhale 1-2 puffs into the lungs every 6 (six) hours as needed for wheezing or shortness of breath.   alfuzosin (UROXATRAL) 10 MG 24 hr tablet TAKE 1 TABLET BY MOUTH DAILY  WITH BREAKFAST DISCONTINUE  TAMSULOSIN   aspirin EC 325 MG EC tablet Take 1 tablet (325 mg total) by mouth daily.   atorvastatin (LIPITOR) 20 MG tablet TAKE 1 TABLET BY MOUTH DAILY   carbidopa-levodopa (SINEMET CR) 50-200 MG tablet Take 1 tablet by mouth at bedtime.   carbidopa-levodopa (SINEMET IR) 25-100 MG tablet Take 2 tablets by mouth 3 (three) times daily. 9am/1pm/5pm   cariprazine (VRAYLAR) 1.5 MG capsule Take 1 capsule (1.5 mg total) by mouth daily.   carvedilol (COREG) 25 MG tablet Take 1 tablet (25 mg total) by mouth 2 (two) times daily with a meal.   celecoxib (CELEBREX) 200 MG capsule Take 1 capsule (200 mg total) by mouth daily.   clonazePAM (KLONOPIN) 1 MG tablet TAKE 1 TABLET BY MOUTH NIGHTLY AND 1 DAILY AS NEEDED FOR ANXIETY   Cyanocobalamin 1000 MCG SUBL Place 1,000 mcg under the tongue daily.   DULoxetine (CYMBALTA) 60 MG capsule TAKE 1 CAPSULE BY MOUTH TWICE  DAILY   fluticasone (FLONASE) 50 MCG/ACT nasal spray Place 1 spray into both nostrils 3 times/day as needed-between meals & bedtime.   Melatonin 10 MG CAPS Take 5 mg by mouth at bedtime.   montelukast (SINGULAIR) 10 MG tablet TAKE 1 TABLET BY MOUTH AT  BEDTIME    spironolactone (ALDACTONE) 25 MG tablet TAKE ONE-HALF TABLET BY MOUTH  DAILY   Vitamin D, Ergocalciferol, (DRISDOL) 1.25 MG (50000 UNIT) CAPS capsule TAKE 1 CAPSULE BY MOUTH EVERY  MONDAY   Zinc 50 MG TABS Take 50 mg by mouth at bedtime.   No facility-administered encounter medications on file as of 04/09/2022.    [place] for General Review Call   Chart Review:  Have there been any documented new, changed, or discontinued medications since last visit? Yes  Has there been any documented recent hospitalizations or ED visits since last visit with Clinical Pharmacist? Yes Brief Summary (including medication and/or Diagnosis changes):   Adherence Review:  Does the Clinical Pharmacist Assistant have access to adherence rates? Yes Adherence rates for STAR metric medications (List medication(s)/day supply/ last 2 fill dates). Atorvastatin 01/17/22-11/04/21 90ds Adherence rates for medications indicated for disease state being reviewed (List medication(s)/day supply/ last 2 fill dates). Does the patient have >5 day gap between last estimated fill dates for any of the above medications or other medication gaps? No Reason for medication gaps.   Disease State Questions:  Able to connect with Patient? Yes Did patient have any problems with their health recently? No Note problems and Concerns: Have you had any admissions or emergency room visits or worsening of your condition(s) since last visit? No Details of ED visit, hospital visit and/or worsening condition(s): Have  you had any visits with new specialists or providers since your last visit? No Explain: Have you had any new health care problem(s) since your last visit? No New problem(s) reported: Have you run out of any of your medications since you last spoke with clinical pharmacist? No What caused you to run out of your medications? Are there any medications you are not taking as prescribed? No What kept you from taking  your medications as prescribed? Are you having any issues or side effects with your medications? No Note of issues or side effects: Do you have any other health concerns or questions you want to discuss with your Clinical Pharmacist before your next visit? No Note additional concerns and questions from Patient. Are there any health concerns that you feel we can do a better job addressing? No Note Patient's response. Are you having any problems with any of the following since the last visit: (select all that apply)  Ambulating/Walking  Details:Pt is having leg pain that shoots down to foot when he gets up in the morning. Pt does have scoliosis but uses Tylenol to help with pain.  12. Any falls since last visit? No  Details: 13. Any increased or uncontrolled pain since last visit? No  Details 14. Next visit Type: office       Visit with:Krasowski, Marily Lente, MD        Date:06/04/22        Time:4:00pm  15. Additional Details? No   Elray Mcgregor, [facility] Catering manager  [phone]

## 2022-04-15 ENCOUNTER — Telehealth: Payer: Self-pay

## 2022-04-15 NOTE — Chronic Care Management (AMB) (Signed)
04-15-2022: Patient is aware that out of pocket expense report is needed from his pharmacy to process vraylar application.  [place] Pharmacist Assistant [phone]

## 2022-04-17 ENCOUNTER — Telehealth: Payer: Self-pay | Admitting: Neurology

## 2022-04-17 NOTE — Telephone Encounter (Signed)
Patient called and said he sent in some forms about his carbidopa levodopa 50/200 and he wants to be sure they were received.

## 2022-04-18 ENCOUNTER — Telehealth: Payer: Self-pay

## 2022-04-18 NOTE — Telephone Encounter (Signed)
Called patient and let him know I have not received these forms and I have checked yesterday and this morning also checking patients chart to see if they had been scanned in. I received a voicemail left message with our fax number and told him opt call me back with any other questions

## 2022-04-18 NOTE — Chronic Care Management (AMB) (Signed)
Missed call from patient, called patient back from [facility], no answer, left message for patient to return call to [employer] due to my personal phone line not working in office.   Patient called back and left message that he was approved for Vrylar.   Pattricia Boss, [place] Pharmacist Assistant [phone]

## 2022-04-18 NOTE — Telephone Encounter (Signed)
Spoke to patient and paperwork coming in th [place] Mail so I am aware and keeping my eyes open for it

## 2022-04-18 NOTE — Progress Notes (Signed)
Pt called me today and stated he got an approval letter from [facility] for his Arman Filter and stated he has been approved until 09/01/22. Pt is going to call and check on shipping status.   Elray Mcgregor, [place] Pharmacist Assistant  [phone]

## 2022-04-25 ENCOUNTER — Telehealth: Payer: Self-pay | Admitting: Neurology

## 2022-04-25 NOTE — Telephone Encounter (Signed)
Pt called in wanting to check and see if we got the forms he sent to [place] in the mail?

## 2022-04-25 NOTE — Telephone Encounter (Signed)
Called patient and informed him that we have not received ppw and will keep a look out.

## 2022-04-29 ENCOUNTER — Other Ambulatory Visit: Payer: Self-pay | Admitting: Family Medicine

## 2022-05-02 DIAGNOSIS — H524 Presbyopia: Secondary | ICD-10-CM | POA: Diagnosis not present

## 2022-05-02 DIAGNOSIS — Z961 Presence of intraocular lens: Secondary | ICD-10-CM | POA: Diagnosis not present

## 2022-05-09 ENCOUNTER — Other Ambulatory Visit: Payer: Self-pay | Admitting: Neurology

## 2022-05-09 DIAGNOSIS — G20 Parkinson's disease: Secondary | ICD-10-CM

## 2022-05-10 ENCOUNTER — Other Ambulatory Visit: Payer: Self-pay | Admitting: Cardiology

## 2022-05-10 ENCOUNTER — Other Ambulatory Visit: Payer: Self-pay

## 2022-05-10 DIAGNOSIS — G20 Parkinson's disease: Secondary | ICD-10-CM

## 2022-05-10 MED ORDER — CARBIDOPA-LEVODOPA 25-100 MG PO TABS: 2.0000 | ORAL_TABLET | Freq: Three times a day (TID) | ORAL | 1 refills | Status: DC

## 2022-05-15 ENCOUNTER — Encounter: Payer: Self-pay | Admitting: Family Medicine

## 2022-05-15 ENCOUNTER — Ambulatory Visit (INDEPENDENT_AMBULATORY_CARE_PROVIDER_SITE_OTHER): Payer: Medicare Other

## 2022-05-15 DIAGNOSIS — Z23 Encounter for immunization: Secondary | ICD-10-CM

## 2022-05-16 ENCOUNTER — Other Ambulatory Visit: Payer: Self-pay

## 2022-05-16 MED ORDER — TAMSULOSIN HCL 0.4 MG PO CAPS: 0.4000 mg | ORAL_CAPSULE | Freq: Every day | ORAL | 1 refills | Status: DC

## 2022-05-23 ENCOUNTER — Telehealth: Payer: Self-pay | Admitting: Neurology

## 2022-05-23 DIAGNOSIS — G20 Parkinson's disease: Secondary | ICD-10-CM

## 2022-05-23 MED ORDER — CARBIDOPA-LEVODOPA ER 50-200 MG PO TBCR: 1.0000 | EXTENDED_RELEASE_TABLET | Freq: Every day | ORAL | 0 refills | Status: DC

## 2022-05-23 NOTE — Telephone Encounter (Signed)
Has office received any forms for completion? 2. Patient requesting call back about patient assistance forms.

## 2022-05-23 NOTE — Telephone Encounter (Signed)
Pt stated that he will get more forms to get them to the office but in the mean time he would like his medication the 50-200's sent to [facility] [place]

## 2022-05-26 ENCOUNTER — Other Ambulatory Visit: Payer: Self-pay | Admitting: Physician Assistant

## 2022-05-26 ENCOUNTER — Other Ambulatory Visit: Payer: Self-pay | Admitting: Neurology

## 2022-05-26 DIAGNOSIS — G20 Parkinson's disease: Secondary | ICD-10-CM

## 2022-05-28 ENCOUNTER — Other Ambulatory Visit: Payer: Self-pay | Admitting: Family Medicine

## 2022-05-28 ENCOUNTER — Telehealth: Payer: Self-pay

## 2022-05-28 MED ORDER — TAMSULOSIN HCL 0.4 MG PO CAPS: 0.8000 mg | ORAL_CAPSULE | Freq: Every day | ORAL | 1 refills | Status: DC

## 2022-05-28 NOTE — Chronic Care Management (AMB) (Signed)
Faxed RxOutreach application to Dr. Carles Collet office at [facility] [phone].  Pattricia Boss, [place] Pharmacist Assistant [phone]

## 2022-05-29 ENCOUNTER — Other Ambulatory Visit: Payer: Self-pay

## 2022-05-29 DIAGNOSIS — G20 Parkinson's disease: Secondary | ICD-10-CM

## 2022-05-29 MED ORDER — CARBIDOPA-LEVODOPA ER 50-200 MG PO TBCR: 1.0000 | EXTENDED_RELEASE_TABLET | Freq: Every day | ORAL | 0 refills | Status: DC

## 2022-05-30 ENCOUNTER — Telehealth: Payer: Self-pay | Admitting: Neurology

## 2022-05-30 NOTE — Telephone Encounter (Signed)
Sent in prescription to [employer] and also called patient to verify the paperwork needed

## 2022-05-30 NOTE — Telephone Encounter (Signed)
Pt said he wants a return call from chelsea about a fax from yesterday.

## 2022-06-03 ENCOUNTER — Other Ambulatory Visit: Payer: Self-pay | Admitting: Family Medicine

## 2022-06-04 ENCOUNTER — Ambulatory Visit: Payer: Medicare Other | Attending: Cardiology | Admitting: Cardiology

## 2022-06-04 ENCOUNTER — Encounter: Payer: Self-pay | Admitting: Cardiology

## 2022-06-04 VITALS — BP 124/76 | HR 67 | Ht 74.0 in | Wt 185.0 lb

## 2022-06-04 DIAGNOSIS — Z953 Presence of xenogenic heart valve: Secondary | ICD-10-CM | POA: Diagnosis not present

## 2022-06-04 DIAGNOSIS — I5032 Chronic diastolic (congestive) heart failure: Secondary | ICD-10-CM | POA: Diagnosis not present

## 2022-06-04 DIAGNOSIS — G2119 Other drug induced secondary parkinsonism: Secondary | ICD-10-CM | POA: Diagnosis not present

## 2022-06-04 DIAGNOSIS — Q2112 Patent foramen ovale: Secondary | ICD-10-CM

## 2022-06-04 DIAGNOSIS — F331 Major depressive disorder, recurrent, moderate: Secondary | ICD-10-CM

## 2022-06-04 NOTE — Progress Notes (Signed)
Cardiology Office Note:    Date:  06/04/2022   ID:  [name], DOB [birth year], MRN [id number]  PCP:  Rochel Brome, MD  Cardiologist:  Jenne Campus, MD    Referring MD: Rochel Brome, MD   Chief Complaint  Patient presents with   Follow-up  Doing fine but emptying him depressed  History of Present Illness:    Travis Reese is a 76 y.o. 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  That was done in August oriented 2019.  Valve use was 27 mm InspirEase valve.  Graft site was 30 mm.  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  After that transesophageal echo has been performed which showed no evidence of blood clotting in the atrial appendages. however he was find to have moderate sized PFO.  He also wear a long-term monitor which showed no evidence of atrial fibrillation.  He is coming today to my office to talk about those issues.  Overall recently he was diagnosed with Parkinson's on his current weak tired and exhausted. Comes today to my office for follow-up.  Overall cardiac wise seems to be doing fine denies of any chest pain tightness squeezing pressure burning chest.  He thinks he is depressed and he does have history of depression.  He is taking medication however does not have any energy does not want to do anything and he thinks partly some play some role however depression as well.  Past Medical History:  Diagnosis Date   Allergic rhinosinusitis    Anxiety    Aortic valve sclerosis    Arthritis    knees, neck, shoulder   CHF (congestive heart failure) (HCC)    Cubital tunnel syndrome    DDD (degenerative disc disease), cervical    Depression    Elevated PSA    denies per patient   HTN (hypertension)     Hyperlipidemia    Hyperplasia of prostate with lower urinary tract symptoms (LUTS)    Hypertension    Mild dilation of ascending aorta (HCC)    Parkinson disease    Prolapsed internal hemorrhoids, grade 3 04/19/2015   Rectal prolapse    Scoliosis of lumbosacral spine    Stroke ([place])    x2   Tremor    Vitamin D deficiency    Wears hearing aid    bilateral    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   BENTALL PROCEDURE N/A 05/01/2018   Procedure: BIOLOGICAL BENTALL PROCEDURE AORTIC ROOT REPLACEMENT WITH 30MM VALSALVA GRAFT & 27MM INSPIRIS VALVE.;  Surgeon: Rexene Alberts, MD;  Location: [place];  Service: Open Heart Surgery;  Laterality: N/A;   BUBBLE STUDY  04/13/2021   Procedure: BUBBLE STUDY;  Surgeon: Buford Dresser, MD;  Location: [facility] ENDOSCOPY;  Service: Cardiovascular;;   CATARACT EXTRACTION Bilateral 04/2018   COLONOSCOPY  2007   NASAL SINUS SURGERY  2006   RIGHT/LEFT HEART CATH AND CORONARY ANGIOGRAPHY N/A 04/27/2018   Procedure: RIGHT/LEFT HEART CATH AND CORONARY ANGIOGRAPHY;  Surgeon: Jolaine Artist, MD;  Location: [place] CV LAB;  Service: Cardiovascular;  Laterality: N/A;   TEE WITHOUT CARDIOVERSION N/A 04/27/2018   Procedure: TRANSESOPHAGEAL ECHOCARDIOGRAM (TEE);  Surgeon: Jolaine Artist, MD;  Location:  [facility] ENDOSCOPY;  Service: Cardiovascular;  Laterality: N/A;   TEE WITHOUT CARDIOVERSION N/A 05/01/2018   Procedure: TRANSESOPHAGEAL ECHOCARDIOGRAM (TEE);  Surgeon: Rexene Alberts, MD;  Location: [place];  Service: Open Heart Surgery;  Laterality: N/A;   TEE WITHOUT CARDIOVERSION N/A 04/13/2021   Procedure: TRANSESOPHAGEAL ECHOCARDIOGRAM (TEE);  Surgeon: Buford Dresser, MD;  Location: [place];  Service: Cardiovascular;  Laterality: N/A;   TRANSANAL HEMORRHOIDAL DEARTERIALIZATION N/A 11/15/2015   Procedure: TRANSANAL HEMORRHOIDAL DEARTERIALIZATION;  Surgeon: Leighton Ruff, MD;  Location: [place];  Service: General;  Laterality: N/A;    Current Medications: Current Meds  Medication Sig   acetaminophen (TYLENOL) 325 MG tablet Take 2 tablets (650 mg total) by mouth every 6 (six) hours as needed for mild pain (or Fever >/= 101).   albuterol (VENTOLIN HFA) 108 (90 Base) MCG/ACT inhaler Inhale 1-2 puffs into the lungs every 6 (six) hours as needed for wheezing or shortness of breath.   alfuzosin (UROXATRAL) 10 MG 24 hr tablet TAKE 1 TABLET BY MOUTH DAILY  WITH BREAKFAST DISCONTINUE  TAMSULOSIN (Patient taking differently: Take 10 mg by mouth daily with breakfast.)   aspirin EC 325 MG EC tablet Take 1 tablet (325 mg total) by mouth daily.   atorvastatin (LIPITOR) 20 MG tablet TAKE 1 TABLET BY MOUTH DAILY (Patient taking differently: Take 20 mg by mouth daily.)   carbidopa-levodopa (SINEMET CR) 50-200 MG tablet Take 1 tablet by mouth at bedtime.   carbidopa-levodopa (SINEMET IR) 25-100 MG tablet TAKE 2 TABLETS BY MOUTH 3 TIMES  DAILY AT 9AM, 1PM, AND 5PM (Patient taking differently: Take 2 tablets by mouth 3 (three) times daily.)   cariprazine (VRAYLAR) 1.5 MG capsule Take 1 capsule (1.5 mg total) by mouth daily.   carvedilol (COREG) 25 MG tablet TAKE 1 TABLET BY MOUTH  TWICE DAILY WITH A MEAL   celecoxib (CELEBREX) 200 MG capsule TAKE 1 CAPSULE BY MOUTH DAILY   clonazePAM (KLONOPIN) 1 MG tablet TAKE 1 TABLET BY MOUTH NIGHTLY AND 1 DAILY AS NEEDED FOR ANXIETY (Patient taking differently: Take 1 mg by mouth See admin instructions. TAKE 1 TABLET BY MOUTH NIGHTLY AND 1 DAILY AS NEEDED FOR ANXIETY)   Cyanocobalamin 1000 MCG SUBL Place 1,000 mcg under the tongue daily.   DULoxetine (CYMBALTA) 60 MG capsule TAKE 1 CAPSULE BY MOUTH TWICE  DAILY (Patient taking differently: Take 60 mg by mouth 2 (two) times daily.)   fluticasone (FLONASE) 50 MCG/ACT nasal spray Place 1 spray into both nostrils 3 times/day as needed-between meals & bedtime. (Patient taking differently: Place 1 spray  into both nostrils 3 times/day as needed-between meals & bedtime for allergies or rhinitis.)   KRILL OIL PO Take 1 tablet by mouth daily.   Melatonin 10 MG CAPS Take 10 mg by mouth at bedtime.   montelukast (SINGULAIR) 10 MG tablet TAKE 1 TABLET BY MOUTH AT  BEDTIME (Patient taking differently: Take 10 mg by mouth at bedtime.)   spironolactone (ALDACTONE) 25 MG tablet TAKE ONE-HALF TABLET BY MOUTH  DAILY (Patient taking differently: Take 12.5 mg by mouth daily.)   Vitamin D, Ergocalciferol, (DRISDOL) 1.25 MG (50000 UNIT) CAPS capsule TAKE 1 CAPSULE BY MOUTH EVERY  MONDAY (Patient taking differently: Take 50,000 Units by mouth every 7 (seven) days.)   Zinc 50 MG TABS Take 50 mg by mouth at bedtime.   [DISCONTINUED] carbidopa-levodopa (SINEMET IR) 25-100 MG tablet Take 2 tablets by mouth 3 (three) times daily. 9am/1pm/5pm   [DISCONTINUED] tamsulosin (FLOMAX) 0.4 MG  CAPS capsule Take 2 capsules (0.8 mg total) by mouth daily after supper.     Allergies:   Patient has no known allergies.   Social History   Socioeconomic History   Marital status: Single    Spouse name: Not on file   Number of children: 2   Years of education: Not on file   Highest education level: Not on file  Occupational History   Occupation: retired    Comment: security guard  Tobacco Use   Smoking status: Some Days    Packs/day: 0.10    Years: 50.00    Total pack years: 5.00    Types: Cigarettes    Last attempt to quit: 05/2018    Years since quitting: 4.0   Smokeless tobacco: Never  Vaping Use   Vaping Use: Former  Substance and Sexual Activity   Alcohol use: Not Currently    Comment: Occasional   Drug use: No   Sexual activity: Not Currently  Other Topics Concern   Not on file  Social History Narrative   Divorced lives with father after incarceration x 5 yrs   1 son, 1 daughter   Psychologist, occupational work   3 caffeine/day   04/19/2015   Right handed   Social Determinants of Health   Financial Resource Strain:  [place] (02/20/2022)   Overall Financial Resource Strain (CARDIA)    Difficulty of Paying Living Expenses: Somewhat hard  Food Insecurity: No Food Insecurity (08/10/2021)   Hunger Vital Sign    Worried About Running Out of Food in the Last Year: Never true    [place] in the Last Year: Never true  Transportation Needs: No Transportation Needs (02/20/2022)   PRAPARE - Hydrologist (Medical): No    Lack of Transportation (Non-Medical): No  Physical Activity: Sufficiently Active (08/10/2021)   Exercise Vital Sign    Days of Exercise per Week: 7 days    Minutes of Exercise per Session: 60 min  Stress: No Stress Concern Present (08/10/2021)   [place]    Feeling of Stress : Not at all  Social Connections: Moderately Isolated (08/10/2021)   Social Connection and Isolation Panel [NHANES]    Frequency of Communication with Friends and Family: More than three times a week    Frequency of Social Gatherings with Friends and Family: Three times a week    Attends Religious Services: Never    Active Member of Clubs or Organizations: No    Attends Archivist Meetings: Never    Marital Status: Living with partner     Family History: The patient's family history includes Breast cancer in his sister; COPD in his mother; Colon polyps in his father; Heart attack in his mother; Hyperlipidemia in his father; Hypertension in his brother and sister; Parkinson's disease in his father; Prostate cancer in his father and paternal uncle. ROS:   Please see the history of present illness.    All 14 point review of systems negative except as described per history of present illness  EKGs/Labs/Other Studies Reviewed:      Recent Labs: 01/14/2022: ALT 10; BUN 17; Creatinine, Ser 0.83; Hemoglobin 13.8; Platelets 182; Potassium 4.3; Sodium 140; TSH 1.280  Recent Lipid Panel    Component Value Date/Time    CHOL 99 (L) 01/14/2022 1022   TRIG 54 01/14/2022 1022   HDL 48 01/14/2022 1022   CHOLHDL 2.1 01/14/2022 1022   CHOLHDL 2.2  04/29/2018 0500   VLDL 6 04/29/2018 0500   LDLCALC 38 01/14/2022 1022    Physical Exam:    VS:  BP 124/76 (BP Location: Left Arm)   Pulse 67   Ht '6\' 2"'$  (1.88 m)   Wt 185 lb (83.9 kg)   SpO2 95%   BMI 23.75 kg/m     Wt Readings from Last 3 Encounters:  06/04/22 185 lb (83.9 kg)  02/05/22 187 lb (84.8 kg)  01/14/22 182 lb (82.6 kg)     GEN:  Well nourished, well developed in no acute distress HEENT: Normal NECK: No JVD; No carotid bruits LYMPHATICS: No lymphadenopathy CARDIAC: RRR, no murmurs, no rubs, no gallops RESPIRATORY:  Clear to auscultation without rales, wheezing or rhonchi  ABDOMEN: Soft, non-tender, non-distended MUSCULOSKELETAL:  No edema; No deformity  SKIN: Warm and dry LOWER EXTREMITIES: no swelling NEUROLOGIC:  Alert and oriented x 3 PSYCHIATRIC:  Normal affect   ASSESSMENT:    1. S/P aortic valve replacement with bioprosthetic valve   2. Chronic diastolic CHF (congestive heart failure) ([place])   3. Moderate recurrent major depression ([place])   4. 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 PFO (patent foramen ovale)    PLAN:    In order of problems listed above:  Status post aortic valve replacement bioprosthetic valve.  We will schedule him to have echocardiogram done to assess the valve. Chronic diastolic congestive heart failure seems to be compensated on physical exam, will continue present management. Moderate recurrent major depression.  Seems to be a significant problem, likely he does not have any suicidal no homicidal ideas however I told him that he need to look for psychiatrist and psychotherapist I told him he needs some help he simply did not get it yet.  He agreed and he will do that. Parkinson: Noted.  Managed by neurologist. PFO there was some concern about may be PFO being responsible for history of CVA however he got  multiple additional reasons for potential CVA therefore we elected not to close PFO. Dyslipidemia I did review his K PN which show me his LDL 38 HDL 48 is a good cholesterol profile we will continue present management.   Medication Adjustments/Labs and Tests Ordered: Current medicines are reviewed at length with the patient today.  Concerns regarding medicines are outlined above.  Orders Placed This Encounter  Procedures   EKG 12-Lead   ECHOCARDIOGRAM COMPLETE   Medication changes: No orders of the defined types were placed in this encounter.   Signed, Park Liter, MD, [facility] 06/04/2022 4:45 PM    [place]

## 2022-06-04 NOTE — Progress Notes (Addendum)
Assessment/Plan:   1.  Parkinsons Disease, worsened by Abilify  -Patient off Abilify since April, 2022  -pt is markedly improved off abilify and on levodopa  -continue carbidopa/levodopa 25/100, 2 tablet 3 times daily, 9am/1pm/5pm  -continue carbidopa/levodopa 50/200 cr at bed to see if helps with first AM on  -Would like to see reduction in clonazepam.  Patient currently on clonazepam, 1 mg and goes through 45 tablets/month per PDMP.  I would like to see complete discontinuation of the daytime dosage, due to risk for falls and confusion.  -referral to PT - [facility]  -trial inbrija for first AM on.  He is not a good candidate for any agonist medication, including apokyn/apomorphine due to hx of confusion and risk of these medications for confusion and hallucinations.  2.  History of cerebral infarction, with moderate sized PFO  -Patient is on aspirin  -Patient is on statin.  3.  Essential tremor  --the tremor he describes (eating) is likely from this  -he is be on primidone but wanted to stop that.  4.  Depression   -refer to psychiatry, Dr. Modesta Messing.  I sent her a message to give her the background of the patient.  Appreciate psychiatry assistance.   Subjective:   Travis Reese was seen today in follow up for Parkinsons disease.  My previous records were reviewed prior to todays visit as well as outside records available to me.   Last visit, we added nighttime levodopa.  He is doing okay.  Awakens in middle of night to use RR.   He went to stop his primidone, which we did.  He did well with this.  He has had no falls since last visit.  He is doing some exercise.  He did see his cardiology earlier this week and those notes are reviewed.  Depression was discussed and it was advised that he seek care from a psychiatrist and psychotherapist.  Current prescribed movement disorder medications: carbidopa/levodopa 25/100, 2 tablets at 9am/1pm/5pm  carbidopa/levodopa 50/200 CR q hs  (added last visit  PREVIOUS MEDICATIONS: Sinemet; primidone (for ET - and did okay stopping that)  ALLERGIES:  No Known Allergies  CURRENT MEDICATIONS:  Outpatient Encounter Medications as of 06/06/2022  Medication Sig   acetaminophen (TYLENOL) 325 MG tablet Take 2 tablets (650 mg total) by mouth every 6 (six) hours as needed for mild pain (or Fever >/= 101).   albuterol (VENTOLIN HFA) 108 (90 Base) MCG/ACT inhaler Inhale 1-2 puffs into the lungs every 6 (six) hours as needed for wheezing or shortness of breath.   alfuzosin (UROXATRAL) 10 MG 24 hr tablet TAKE 1 TABLET BY MOUTH DAILY  WITH BREAKFAST DISCONTINUE  TAMSULOSIN (Patient taking differently: Take 10 mg by mouth daily with breakfast.)   aspirin EC 325 MG EC tablet Take 1 tablet (325 mg total) by mouth daily.   atorvastatin (LIPITOR) 20 MG tablet TAKE 1 TABLET BY MOUTH DAILY (Patient taking differently: Take 20 mg by mouth daily.)   carbidopa-levodopa (SINEMET CR) 50-200 MG tablet Take 1 tablet by mouth at bedtime.   carbidopa-levodopa (SINEMET IR) 25-100 MG tablet TAKE 2 TABLETS BY MOUTH 3 TIMES  DAILY AT 9AM, 1PM, AND 5PM (Patient taking differently: Take 2 tablets by mouth 3 (three) times daily.)   cariprazine (VRAYLAR) 1.5 MG capsule Take 1 capsule (1.5 mg total) by mouth daily.   carvedilol (COREG) 25 MG tablet TAKE 1 TABLET BY MOUTH  TWICE DAILY WITH A MEAL   celecoxib (CELEBREX)  200 MG capsule TAKE 1 CAPSULE BY MOUTH DAILY   clonazePAM (KLONOPIN) 1 MG tablet TAKE 1 TABLET BY MOUTH NIGHTLY AND 1 DAILY AS NEEDED FOR ANXIETY (Patient taking differently: Take 1 mg by mouth See admin instructions. TAKE 1 TABLET BY MOUTH NIGHTLY AND 1 DAILY AS NEEDED FOR ANXIETY)   Cyanocobalamin 1000 MCG SUBL Place 1,000 mcg under the tongue daily.   DULoxetine (CYMBALTA) 60 MG capsule TAKE 1 CAPSULE BY MOUTH TWICE  DAILY (Patient taking differently: Take 60 mg by mouth 2 (two) times daily.)   fluticasone (FLONASE) 50 MCG/ACT nasal spray Place 1 spray  into both nostrils 3 times/day as needed-between meals & bedtime. (Patient taking differently: Place 1 spray into both nostrils 3 times/day as needed-between meals & bedtime for allergies or rhinitis.)   KRILL OIL PO Take 1 tablet by mouth daily.   Melatonin 10 MG CAPS Take 10 mg by mouth at bedtime.   montelukast (SINGULAIR) 10 MG tablet TAKE 1 TABLET BY MOUTH AT  BEDTIME (Patient taking differently: Take 10 mg by mouth at bedtime.)   spironolactone (ALDACTONE) 25 MG tablet TAKE ONE-HALF TABLET BY MOUTH  DAILY (Patient taking differently: Take 12.5 mg by mouth daily.)   Vitamin D, Ergocalciferol, (DRISDOL) 1.25 MG (50000 UNIT) CAPS capsule TAKE 1 CAPSULE BY MOUTH EVERY  MONDAY (Patient taking differently: Take 50,000 Units by mouth every 7 (seven) days.)   Zinc 50 MG TABS Take 50 mg by mouth at bedtime.   No facility-administered encounter medications on file as of 06/06/2022.    Objective:   PHYSICAL EXAMINATION:    VITALS:   Vitals:   06/06/22 1455  BP: 115/73  Pulse: 67  SpO2: 98%  Weight: 185 lb (83.9 kg)  Height: '6\' 2"'$  (1.88 m)      GEN:  The patient appears stated age and is in NAD. HEENT:  Normocephalic, atraumatic.  The mucous membranes are moist. The superficial temporal arteries are without ropiness or tenderness. CV:  RRR Lungs:  CTAB Neck/HEME:  There are no carotid bruits bilaterally.  Neurological examination:  Orientation: The patient is alert and oriented x3. Cranial nerves: There is good facial symmetry with facial hypomimia. The speech is fluent and clear. Soft palate rises symmetrically and there is no tongue deviation. Hearing is intact to conversational tone. Sensation: Sensation is intact to light touch throughout Motor: Strength is at least antigravity x4.  Movement examination: Tone: There is mild increased tone in the LUE Abnormal movements: no rest tremor Coordination:  There is mild decremation with RAM's, with any form of RAMS, including  alternating supination and pronation of the forearm, hand opening and closing, finger taps, heel taps and toe taps on the L Gait and Station: The patient has  difficulty arising out of a deep-seated chair without the use of the hands. The patient's stride length is decreased and he is dragging the R leg.    I have reviewed and interpreted the following labs independently    Chemistry      Component Value Date/Time   NA 140 01/14/2022 1022   K 4.3 01/14/2022 1022   CL 103 01/14/2022 1022   CO2 24 01/14/2022 1022   BUN 17 01/14/2022 1022   CREATININE 0.83 01/14/2022 1022   CREATININE 0.77 07/21/2017 1401      Component Value Date/Time   CALCIUM 9.2 01/14/2022 1022   ALKPHOS 90 01/14/2022 1022   AST 19 01/14/2022 1022   ALT 10 01/14/2022 1022   BILITOT 0.5 01/14/2022  1022       Lab Results  Component Value Date   WBC 6.9 01/14/2022   HGB 13.8 01/14/2022   HCT 40.1 01/14/2022   MCV 100 (H) 01/14/2022   PLT 182 01/14/2022    Lab Results  Component Value Date   TSH 1.280 01/14/2022     Total time spent on today's visit was 73mnutes, including both face-to-face time and nonface-to-face time.  Time included that spent on review of records (prior notes available to me/labs/imaging if pertinent), discussing treatment and goals, answering patient's questions and coordinating care.  Cc:  CRochel Brome MD

## 2022-06-04 NOTE — Patient Instructions (Addendum)

## 2022-06-06 ENCOUNTER — Ambulatory Visit: Payer: Medicare Other | Admitting: Neurology

## 2022-06-06 ENCOUNTER — Encounter: Payer: Self-pay | Admitting: Neurology

## 2022-06-06 VITALS — BP 115/73 | HR 67 | Ht 74.0 in | Wt 185.0 lb

## 2022-06-06 DIAGNOSIS — G2119 Other drug induced secondary parkinsonism: Secondary | ICD-10-CM

## 2022-06-06 DIAGNOSIS — F331 Major depressive disorder, recurrent, moderate: Secondary | ICD-10-CM

## 2022-06-06 DIAGNOSIS — G20A1 Parkinson's disease without dyskinesia, without mention of fluctuations: Secondary | ICD-10-CM

## 2022-06-06 NOTE — Progress Notes (Addendum)
Medication Samples have been provided to the patient.  Drug name: Inbrija       Strength: 42 mg        Qty: 1  LOT: [id number]  Exp.Date: 08/2023  Dosing instructions:   The patient has been instructed regarding the correct time, dose, and frequency of taking this medication, including desired effects and most common side effects.   [place]etia Night 3:19 PM 06/06/2022

## 2022-06-06 NOTE — Patient Instructions (Addendum)
We are going to start inbrija.  Remember that TWO capsules is ONE dosage (never inhale just one capsule).  You can inhale the capsules as needed up to 5 times per day, separated by 2 hour intervals.  Many patients use this right when the wake up to help with first morning "on" and then as needed during the day.  You should take a sip of water prior to using the inhaler to avoid side effects.  It may generate some cough right when you use it and that is normal.  There are nurse educators available to help you with this device.  You can call [phone] and they will set you up with a nurse educator to assist you for free of charge.  They are available 8am-8pm Monday-Friday.

## 2022-06-13 ENCOUNTER — Telehealth: Payer: Self-pay

## 2022-06-13 NOTE — Telephone Encounter (Signed)
Patient would like to have rx sent in to [facility] on [address] for shingrix and pneumonia vaccine. Please advise

## 2022-06-13 NOTE — Progress Notes (Signed)
Chronic Care Management Pharmacy Assistant   Name: Travis Reese  MRN: [id number] DOB: 01-22-1946   Reason for Encounter: General adherence/Patient Assistance Renewal    Recent office visits:  None  Recent consult visits:  06/06/22 (Neurology) Tat, Wells Guiles DO. Seen for Depression. Referral to Psychiatry and Physical Therapy.We are going to start inbrija.  Remember that TWO capsules is ONE dosage (never inhale just one capsule).  You can inhale the capsules as needed up to 5 times per day, separated by 2 hour intervals.  06/04/22 (Cardiology) Jenne Campus MD. Seen for follow up. D/C tamsulosin 0.'8mg'$ . Changed Carbidopa-Levodopa to 50-'200mg'$ .  Hospital visits:  None  Medications: Outpatient Encounter Medications as of 06/13/2022  Medication Sig   acetaminophen (TYLENOL) 325 MG tablet Take 2 tablets (650 mg total) by mouth every 6 (six) hours as needed for mild pain (or Fever >/= 101).   albuterol (VENTOLIN HFA) 108 (90 Base) MCG/ACT inhaler Inhale 1-2 puffs into the lungs every 6 (six) hours as needed for wheezing or shortness of breath.   alfuzosin (UROXATRAL) 10 MG 24 hr tablet TAKE 1 TABLET BY MOUTH DAILY  WITH BREAKFAST DISCONTINUE  TAMSULOSIN (Patient taking differently: Take 10 mg by mouth daily with breakfast.)   aspirin EC 325 MG EC tablet Take 1 tablet (325 mg total) by mouth daily.   atorvastatin (LIPITOR) 20 MG tablet TAKE 1 TABLET BY MOUTH DAILY (Patient taking differently: Take 20 mg by mouth daily.)   carbidopa-levodopa (SINEMET CR) 50-200 MG tablet Take 1 tablet by mouth at bedtime.   carbidopa-levodopa (SINEMET IR) 25-100 MG tablet TAKE 2 TABLETS BY MOUTH 3 TIMES  DAILY AT 9AM, 1PM, AND 5PM (Patient taking differently: Take 2 tablets by mouth 3 (three) times daily.)   cariprazine (VRAYLAR) 1.5 MG capsule Take 1 capsule (1.5 mg total) by mouth daily.   carvedilol (COREG) 25 MG tablet TAKE 1 TABLET BY MOUTH  TWICE DAILY WITH A MEAL   celecoxib (CELEBREX) 200 MG  capsule TAKE 1 CAPSULE BY MOUTH DAILY   clonazePAM (KLONOPIN) 1 MG tablet TAKE 1 TABLET BY MOUTH NIGHTLY AND 1 DAILY AS NEEDED FOR ANXIETY (Patient taking differently: Take 1 mg by mouth See admin instructions. TAKE 1 TABLET BY MOUTH NIGHTLY AND 1 DAILY AS NEEDED FOR ANXIETY)   Cyanocobalamin 1000 MCG SUBL Place 1,000 mcg under the tongue daily.   DULoxetine (CYMBALTA) 60 MG capsule TAKE 1 CAPSULE BY MOUTH TWICE  DAILY (Patient taking differently: Take 60 mg by mouth 2 (two) times daily.)   fluticasone (FLONASE) 50 MCG/ACT nasal spray Place 1 spray into both nostrils 3 times/day as needed-between meals & bedtime. (Patient taking differently: Place 1 spray into both nostrils 3 times/day as needed-between meals & bedtime for allergies or rhinitis.)   KRILL OIL PO Take 1 tablet by mouth daily.   Melatonin 10 MG CAPS Take 10 mg by mouth at bedtime.   montelukast (SINGULAIR) 10 MG tablet TAKE 1 TABLET BY MOUTH AT  BEDTIME (Patient taking differently: Take 10 mg by mouth at bedtime.)   spironolactone (ALDACTONE) 25 MG tablet TAKE ONE-HALF TABLET BY MOUTH  DAILY (Patient taking differently: Take 12.5 mg by mouth daily.)   Vitamin D, Ergocalciferol, (DRISDOL) 1.25 MG (50000 UNIT) CAPS capsule TAKE 1 CAPSULE BY MOUTH EVERY  MONDAY (Patient taking differently: Take 50,000 Units by mouth every 7 (seven) days.)   Zinc 50 MG TABS Take 50 mg by mouth at bedtime.   No facility-administered encounter medications on file as of  06/13/2022.    [place] for General Review Call   Chart Review:  Have there been any documented new, changed, or discontinued medications since last visit? Yes  Has there been any documented recent hospitalizations or ED visits since last visit with Clinical Pharmacist? No  Adherence Review:  Does the Clinical Pharmacist Assistant have access to adherence rates? Yes Adherence rates for STAR metric medications (List medication(s)/day supply/ last 2 fill dates).  Atorvastatin '20mg'$  04/04/22 100ds 01/17/22 90ds Adherence rates for medications indicated for disease state being reviewed (List medication(s)/day supply/ last 2 fill dates). Does the patient have >5 day gap between last estimated fill dates for any of the above medications or other medication gaps? No Reason for medication gaps.   Disease State Questions:  Able to connect with Patient? Yes Did patient have any problems with their health recently? No Have you had any admissions or emergency room visits or worsening of your condition(s) since last visit? No  Have you had any visits with new specialists or providers since your last visit? No  Have you had any new health care problem(s) since your last visit? No  Have you run out of any of your medications since you last spoke with clinical pharmacist? No  Are there any medications you are not taking as prescribed? No  Are you having any issues or side effects with your medications? No  Do you have any other health concerns or questions you want to discuss with your Clinical Pharmacist before your next visit? No  Are there any health concerns that you feel we can do a better job addressing? No  Are you having any problems with any of the following since the last visit: (select all that apply)  None  12. Any falls since last visit? No   13. Any increased or uncontrolled pain since last visit? No   14. Next visit Type: office       Visit with:Dr. Cox MD        Date:07/19/22        Time:9:40am  15. Additional Details? Yes, Vraylar 2024 PAP renewal has been started and uploaded to be mailed out.    Elray Mcgregor, [facility] Pharmacist Assistant  [phone]

## 2022-06-14 ENCOUNTER — Ambulatory Visit: Payer: Medicare Other | Attending: Cardiology

## 2022-06-14 DIAGNOSIS — Z953 Presence of xenogenic heart valve: Secondary | ICD-10-CM | POA: Diagnosis not present

## 2022-06-14 LAB — ECHOCARDIOGRAM COMPLETE
AV Mean grad: 7 mmHg
AV Peak grad: 12.5 mmHg
Ao pk vel: 1.77 m/s
Area-P 1/2: 2.24 cm2
S' Lateral: 3.9 cm

## 2022-06-17 NOTE — Telephone Encounter (Signed)
Left detailed message on patient phone  

## 2022-06-17 NOTE — Telephone Encounter (Signed)
I called [facility] and in order for patient to get both vaccines he will need an rx sent to pharmacy, they said it is so they can bill insurance.

## 2022-06-18 NOTE — Telephone Encounter (Signed)
I left message on voicemail. 

## 2022-06-24 ENCOUNTER — Telehealth: Payer: Self-pay

## 2022-06-24 NOTE — Telephone Encounter (Signed)
Left my chart message regarding normal Echo results.

## 2022-06-25 ENCOUNTER — Other Ambulatory Visit: Payer: Self-pay

## 2022-06-25 ENCOUNTER — Telehealth: Payer: Self-pay | Admitting: Anesthesiology

## 2022-06-25 ENCOUNTER — Other Ambulatory Visit (HOSPITAL_COMMUNITY): Payer: Self-pay

## 2022-06-25 DIAGNOSIS — G20A1 Parkinson's disease without dyskinesia, without mention of fluctuations: Secondary | ICD-10-CM

## 2022-06-25 MED ORDER — INBRIJA 42 MG IN CAPS: ORAL_CAPSULE | RESPIRATORY_TRACT | 0 refills | Status: DC

## 2022-06-25 NOTE — Telephone Encounter (Signed)
Patient requesting a call back regarding the new medication (Inbrija) Dr Tat prescribed for him.

## 2022-06-25 NOTE — Telephone Encounter (Signed)
Patient said Travis Reese is helping him a lot both mentally and physically. I am stating PA today patient down to two capsules.  Patient is going to come by and get a sample if you approve Dr. Carles Collet

## 2022-06-28 ENCOUNTER — Telehealth: Payer: Self-pay | Admitting: Neurology

## 2022-06-28 NOTE — Telephone Encounter (Signed)
Pt called in and left a message with the access nurse. He would like to pick up some samples

## 2022-07-01 ENCOUNTER — Encounter: Payer: Self-pay | Admitting: Neurology

## 2022-07-02 ENCOUNTER — Telehealth: Payer: Self-pay

## 2022-07-02 ENCOUNTER — Other Ambulatory Visit (HOSPITAL_COMMUNITY): Payer: Self-pay

## 2022-07-02 NOTE — Telephone Encounter (Signed)
Received a fax from [facility] regarding Prior Authorization for Inbrija Cap '42mg'$ .   Authorization has been DENIED due to e it is not on your plan's Drug List (formulary). Medication authorization requires the following: (1) You need to try this covered drug: Apomorphine*; OR (2) your doctor needs to give [place] specific medical reasons why the covered drug is not appropriate for you.

## 2022-07-02 NOTE — Telephone Encounter (Signed)
Patient Advocate Encounter   Received notification from [facility] that prior authorization is required for Inbrija '42MG'$  capsules  Submitted: 07-02-2022 Key [id number]   Status is pending

## 2022-07-03 NOTE — Telephone Encounter (Signed)
Patient Advocate Encounter  Prior Authorization for Inbrija '42mg'$  capsules has been resubmitted per md request.  Resubmitted with updated chart notes.   Submitted: 07-03-2022 Key: [id number]  Status is pending

## 2022-07-03 NOTE — Telephone Encounter (Signed)
Received a fax from [facility] regarding Resubmitted Prior Authorization for Inbrija Cap '42mg'$ .   Authorization has been cancelled due to denial on 07-02-2022.

## 2022-07-04 ENCOUNTER — Other Ambulatory Visit: Payer: Self-pay

## 2022-07-04 DIAGNOSIS — Z23 Encounter for immunization: Secondary | ICD-10-CM

## 2022-07-04 MED ORDER — RSVPREF3 VAC RECOMB ADJUVANTED 120 MCG/0.5ML IM SUSR: 0.5000 mL | Freq: Once | INTRAMUSCULAR | 0 refills | Status: AC

## 2022-07-06 ENCOUNTER — Other Ambulatory Visit: Payer: Self-pay | Admitting: Neurology

## 2022-07-06 DIAGNOSIS — G20A1 Parkinson's disease without dyskinesia, without mention of fluctuations: Secondary | ICD-10-CM

## 2022-07-09 ENCOUNTER — Other Ambulatory Visit: Payer: Self-pay

## 2022-07-09 ENCOUNTER — Other Ambulatory Visit (HOSPITAL_COMMUNITY): Payer: Self-pay

## 2022-07-09 ENCOUNTER — Telehealth: Payer: Self-pay | Admitting: Neurology

## 2022-07-09 DIAGNOSIS — G20A1 Parkinson's disease without dyskinesia, without mention of fluctuations: Secondary | ICD-10-CM

## 2022-07-09 MED ORDER — CARIPRAZINE HCL 1.5 MG PO CAPS: 1.5000 mg | ORAL_CAPSULE | Freq: Every day | ORAL | 0 refills | Status: DC

## 2022-07-09 MED ORDER — CARBIDOPA-LEVODOPA ER 50-200 MG PO TBCR: 1.0000 | EXTENDED_RELEASE_TABLET | Freq: Every day | ORAL | 0 refills | Status: DC

## 2022-07-09 NOTE — Telephone Encounter (Signed)
Patient called requesting a call back from [facility] about his carbidopa levodopa.

## 2022-07-09 NOTE — Telephone Encounter (Signed)
Patient Advocate Encounter  Prior Authorization Appeal for Inbrija '42mg'$  capsules  has been approved.    Effective: 07-06-2022 to --

## 2022-07-09 NOTE — Telephone Encounter (Signed)
Sent patients refill

## 2022-07-10 NOTE — Telephone Encounter (Signed)
Called patient and left voicemail.

## 2022-07-19 ENCOUNTER — Ambulatory Visit (INDEPENDENT_AMBULATORY_CARE_PROVIDER_SITE_OTHER): Payer: Medicare Other | Admitting: Family Medicine

## 2022-07-19 VITALS — BP 116/72 | HR 72 | Temp 96.6°F | Resp 16 | Ht 74.0 in | Wt 187.0 lb

## 2022-07-19 DIAGNOSIS — J3089 Other allergic rhinitis: Secondary | ICD-10-CM

## 2022-07-19 DIAGNOSIS — Z23 Encounter for immunization: Secondary | ICD-10-CM | POA: Diagnosis not present

## 2022-07-19 DIAGNOSIS — E559 Vitamin D deficiency, unspecified: Secondary | ICD-10-CM

## 2022-07-19 DIAGNOSIS — E538 Deficiency of other specified B group vitamins: Secondary | ICD-10-CM | POA: Diagnosis not present

## 2022-07-19 DIAGNOSIS — G2119 Other drug induced secondary parkinsonism: Secondary | ICD-10-CM | POA: Diagnosis not present

## 2022-07-19 DIAGNOSIS — I700 Atherosclerosis of aorta: Secondary | ICD-10-CM

## 2022-07-19 DIAGNOSIS — N401 Enlarged prostate with lower urinary tract symptoms: Secondary | ICD-10-CM

## 2022-07-19 DIAGNOSIS — I110 Hypertensive heart disease with heart failure: Secondary | ICD-10-CM | POA: Diagnosis not present

## 2022-07-19 DIAGNOSIS — I5032 Chronic diastolic (congestive) heart failure: Secondary | ICD-10-CM | POA: Diagnosis not present

## 2022-07-19 DIAGNOSIS — R093 Abnormal sputum: Secondary | ICD-10-CM

## 2022-07-19 DIAGNOSIS — Z720 Tobacco use: Secondary | ICD-10-CM

## 2022-07-19 DIAGNOSIS — F331 Major depressive disorder, recurrent, moderate: Secondary | ICD-10-CM

## 2022-07-19 DIAGNOSIS — H6123 Impacted cerumen, bilateral: Secondary | ICD-10-CM

## 2022-07-19 DIAGNOSIS — E782 Mixed hyperlipidemia: Secondary | ICD-10-CM | POA: Diagnosis not present

## 2022-07-19 MED ORDER — TAMSULOSIN HCL 0.4 MG PO CAPS: 0.4000 mg | ORAL_CAPSULE | Freq: Two times a day (BID) | ORAL | Status: DC

## 2022-07-19 NOTE — Assessment & Plan Note (Signed)
The current medical regimen is effective;  continue present plan and medications.  

## 2022-07-19 NOTE — Assessment & Plan Note (Signed)
Management per specialist. 

## 2022-07-19 NOTE — Assessment & Plan Note (Signed)
Well controlled.  No changes to medicines. carvedilol 25 mg bid, spironolactone 25 mg 1/2 daily Continue to work on eating a healthy diet and exercise.  Labs drawn today.

## 2022-07-19 NOTE — Assessment & Plan Note (Signed)
Well controlled.  ?No changes to medicines.  ?Continue to work on eating a healthy diet and exercise.  ?Labs drawn today.  ?

## 2022-07-19 NOTE — Patient Instructions (Addendum)
Stop alfuzosin. Increase tamsulosin to 0.4 mg twice daily   Hold flonase and benadryl. Use Nasal saline.  If persistent dark sputum, call back and will order a chest xray.   Use debrox drops for ear wax. Do both ears. Call back the week after Thanksgiving to set up a nurse visit for ear irrigation.

## 2022-07-19 NOTE — Assessment & Plan Note (Signed)
The current medical regimen is effective;  continue present plan and medications.

## 2022-07-19 NOTE — Progress Notes (Unsigned)
Subjective:  Patient ID: Travis Reese, male    DOB: [birth year]  Age: 76 y.o. MRN: [id number]  Chief Complaint  Patient presents with   Hyperlipidemia   Hypertension   Benign Prostatic Hypertrophy    HPI  Parkinson's disease: Pt saw Dr. Carles Collet and she increased his sinemet.  Current Medications: Sinemet IR 25-'100mg'$  take 2 tablets TID, Simemet CR 50-200 1 tablet at bedtime. Added  Inbrija.   Hyperlipidemia: Taking Atorvastatin 20 mg daily  BPH: Current medications: Alfuzosin 10 mg daily in am and tamsulosin in evening.  This is working better, but patient is wondering if tamsulosin twice daily will be better. Patient is not seeing a urologist.   Bipolar Depression:  Current Medications: cymbalta 60 mg one tablet twice daily, Vraylar 1.5 mg daily. Scheduled to see psychiatrist in December 2023. Referred by Dr. Carles Collet.      07/19/2022    9:46 AM 02/05/2022    9:49 AM 01/14/2022   10:09 AM  PHQ9 SCORE ONLY  PHQ-9 Total Score '10 2 5    '$ Hypertensive heart disease with diastolic CHF: ON carvedilol 25 mg twice daily  spironolactone 25 mg 1/2 daily.   B12 deficiency: B12 1000 mcg one oral daily.    Current Outpatient Medications on File Prior to Visit  Medication Sig Dispense Refill   albuterol (VENTOLIN HFA) 108 (90 Base) MCG/ACT inhaler Inhale 1-2 puffs into the lungs every 6 (six) hours as needed for wheezing or shortness of breath.     aspirin EC 325 MG EC tablet Take 1 tablet (325 mg total) by mouth daily. 30 tablet 0   atorvastatin (LIPITOR) 20 MG tablet TAKE 1 TABLET BY MOUTH DAILY (Patient taking differently: Take 20 mg by mouth daily.) 90 tablet 3   carbidopa-levodopa (SINEMET CR) 50-200 MG tablet Take 1 tablet by mouth at bedtime. 90 tablet 0   carbidopa-levodopa (SINEMET IR) 25-100 MG tablet TAKE 2 TABLETS BY MOUTH 3 TIMES  DAILY AT 9AM, 1PM, AND 5PM (Patient taking differently: Take 2 tablets by mouth 3 (three) times daily.) 540 tablet 3   cariprazine (VRAYLAR) 1.5 MG  capsule Take 1 capsule (1.5 mg total) by mouth daily. 90 capsule 0   carvedilol (COREG) 25 MG tablet TAKE 1 TABLET BY MOUTH  TWICE DAILY WITH A MEAL 180 tablet 0   celecoxib (CELEBREX) 200 MG capsule TAKE 1 CAPSULE BY MOUTH DAILY 100 capsule 2   clonazePAM (KLONOPIN) 1 MG tablet TAKE 1 TABLET BY MOUTH NIGHTLY AND 1 DAILY AS NEEDED FOR ANXIETY (Patient taking differently: Take 1 mg by mouth See admin instructions. TAKE 1 TABLET BY MOUTH NIGHTLY AND 1 DAILY AS NEEDED FOR ANXIETY) 45 tablet 1   Cyanocobalamin 1000 MCG SUBL Place 1,000 mcg under the tongue daily.     DULoxetine (CYMBALTA) 60 MG capsule TAKE 1 CAPSULE BY MOUTH TWICE  DAILY (Patient taking differently: Take 60 mg by mouth 2 (two) times daily.) 180 capsule 3   fluticasone (FLONASE) 50 MCG/ACT nasal spray Place 1 spray into both nostrils 3 times/day as needed-between meals & bedtime. (Patient taking differently: Place 1 spray into both nostrils 3 times/day as needed-between meals & bedtime for allergies or rhinitis.) 48 g 3   KRILL OIL PO Take 1 tablet by mouth daily.     Levodopa (INBRIJA) 42 MG CAPS Samples of this drug were given to the patient, quantity 1, Lot Number [id number] exp 08/03/2023 60 capsule 0   Melatonin 10 MG CAPS Take 10 mg by mouth  at bedtime.     montelukast (SINGULAIR) 10 MG tablet TAKE 1 TABLET BY MOUTH AT  BEDTIME (Patient taking differently: Take 10 mg by mouth at bedtime.) 100 tablet 1   spironolactone (ALDACTONE) 25 MG tablet TAKE ONE-HALF TABLET BY MOUTH  DAILY (Patient taking differently: Take 12.5 mg by mouth daily.) 45 tablet 3   Vitamin D, Ergocalciferol, (DRISDOL) 1.25 MG (50000 UNIT) CAPS capsule TAKE 1 CAPSULE BY MOUTH EVERY  MONDAY (Patient taking differently: Take 50,000 Units by mouth every 7 (seven) days.) 13 capsule 3   Zinc 50 MG TABS Take 50 mg by mouth at bedtime.     acetaminophen (TYLENOL) 325 MG tablet Take 2 tablets (650 mg total) by mouth every 6 (six) hours as needed for mild pain (or Fever >/=  101). (Patient not taking: Reported on 07/19/2022)     No current facility-administered medications on file prior to visit.   Past Medical History:  Diagnosis Date   Allergic rhinosinusitis    Anxiety    Aortic valve sclerosis    Arthritis    knees, neck, shoulder   CHF (congestive heart failure) (HCC)    Cubital tunnel syndrome    DDD (degenerative disc disease), cervical    Depression    Elevated PSA    denies per patient   HTN (hypertension)    Hyperlipidemia    Hyperplasia of prostate with lower urinary tract symptoms (LUTS)    Hypertension    Mild dilation of ascending aorta (HCC)    Parkinson disease    Prolapsed internal hemorrhoids, grade 3 04/19/2015   Rectal prolapse    Scoliosis of lumbosacral spine    Stroke ([place])    x2   Tremor    Vitamin D deficiency    Wears hearing aid    bilateral   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   BENTALL PROCEDURE N/A 05/01/2018   Procedure: BIOLOGICAL BENTALL PROCEDURE AORTIC ROOT REPLACEMENT WITH 30MM VALSALVA GRAFT & 27MM INSPIRIS VALVE.;  Surgeon: Rexene Alberts, MD;  Location: [facility];  Service: Open Heart Surgery;  Laterality: N/A;   BUBBLE STUDY  04/13/2021   Procedure: BUBBLE STUDY;  Surgeon: Buford Dresser, MD;  Location: [facility] ENDOSCOPY;  Service: Cardiovascular;;   CATARACT EXTRACTION Bilateral 04/2018   COLONOSCOPY  2007   NASAL SINUS SURGERY  2006   RIGHT/LEFT HEART CATH AND CORONARY ANGIOGRAPHY N/A 04/27/2018   Procedure: RIGHT/LEFT HEART CATH AND CORONARY ANGIOGRAPHY;  Surgeon: Jolaine Artist, MD;  Location: [place] CV LAB;  Service: Cardiovascular;  Laterality: N/A;   TEE WITHOUT CARDIOVERSION N/A 04/27/2018   Procedure: TRANSESOPHAGEAL ECHOCARDIOGRAM (TEE);  Surgeon: Jolaine Artist, MD;  Location[facility] ENDOSCOPY;  Service: Cardiovascular;  Laterality: N/A;   TEE WITHOUT CARDIOVERSION N/A 05/01/2018   Procedure: TRANSESOPHAGEAL ECHOCARDIOGRAM  (TEE);  Surgeon: Rexene Alberts, MD;  Location: [facility];  Service: Open Heart Surgery;  Laterality: N/A;   TEE WITHOUT CARDIOVERSION N/A 04/13/2021   Procedure: TRANSESOPHAGEAL ECHOCARDIOGRAM (TEE);  Surgeon: Buford Dresser, MD;  Location[facility] ENDOSCOPY;  Service: Cardiovascular;  Laterality: N/A;   TRANSANAL HEMORRHOIDAL DEARTERIALIZATION N/A 11/15/2015   Procedure: TRANSANAL HEMORRHOIDAL DEARTERIALIZATION;  Surgeon: Leighton Ruff, MD;  Location: [facility];  Service: General;  Laterality: N/A;    Family History  Problem Relation Age of Onset   COPD Mother    Heart attack Mother    Colon polyps Father    Prostate cancer Father  Parkinson's disease Father    Hyperlipidemia Father    Hypertension Sister    Hypertension Brother    Breast cancer Sister    Prostate cancer Paternal Uncle    Social History   Socioeconomic History   Marital status: Single    Spouse name: Not on file   Number of children: 2   Years of education: Not on file   Highest education level: Not on file  Occupational History   Occupation: retired    Comment: security guard  Tobacco Use   Smoking status: Some Days    Packs/day: 0.10    Years: 50.00    Total pack years: 5.00    Types: Cigarettes    Last attempt to quit: 05/2018    Years since quitting: 4.2   Smokeless tobacco: Never  Vaping Use   Vaping Use: Former  Substance and Sexual Activity   Alcohol use: Not Currently    Comment: Occasional   Drug use: No   Sexual activity: Not Currently  Other Topics Concern   Not on file  Social History Narrative   Divorced lives with father after incarceration x 5 yrs   1 son, 1 daughter   Psychologist, occupational work   3 caffeine/day   04/19/2015   Right handed   Social Determinants of Health   Financial Resource Strain: [place] (02/20/2022)   Overall Financial Resource Strain (CARDIA)    Difficulty of Paying Living Expenses: Somewhat hard  Food Insecurity: No Food Insecurity  (08/10/2021)   Hunger Vital Sign    Worried About Running Out of Food in the Last Year: Never true    [name] in the Last Year: Never true  Transportation Needs: No Transportation Needs (02/20/2022)   PRAPARE - Hydrologist (Medical): No    Lack of Transportation (Non-Medical): No  Physical Activity: Sufficiently Active (08/10/2021)   Exercise Vital Sign    Days of Exercise per Week: 7 days    Minutes of Exercise per Session: 60 min  Stress: No Stress Concern Present (08/10/2021)   [place]    Feeling of Stress : Not at all  Social Connections: Moderately Isolated (08/10/2021)   Social Connection and Isolation Panel [NHANES]    Frequency of Communication with Friends and Family: More than three times a week    Frequency of Social Gatherings with Friends and Family: Three times a week    Attends Religious Services: Never    Active Member of Clubs or Organizations: No    Attends Archivist Meetings: Never    Marital Status: Living with partner    Review of Systems  Constitutional:  Negative for chills, fatigue, fever and unexpected weight change.  HENT:  Negative for congestion, ear pain, sinus pain and sore throat.   Respiratory:  Positive for cough (black sputum a few time. no epistaxis, but nose is dry.). Negative for shortness of breath.   Cardiovascular:  Negative for chest pain and palpitations.  Gastrointestinal:  Negative for abdominal pain, blood in stool, constipation, diarrhea, nausea and vomiting.  Endocrine: Negative for polydipsia.  Genitourinary:  Positive for urgency. Negative for dysuria.  Musculoskeletal:  Positive for back pain.  Skin:  Negative for rash.  Neurological:  Positive for tremors and weakness. Negative for headaches.  Psychiatric/Behavioral:  Negative for dysphoric mood. The patient is not nervous/anxious.      Objective:  BP 116/72    Pulse 72  Temp (!) 96.6 F (35.9 C)   Resp 16   Ht '6\' 2"'$  (1.88 m)   Wt 187 lb (84.8 kg)   BMI 24.01 kg/m      07/19/2022    9:39 AM 06/06/2022    2:55 PM 06/04/2022    3:58 PM  BP/Weight  Systolic BP 254 270 623  Diastolic BP 72 73 76  Wt. (Lbs) 187 185 185  BMI 24.01 kg/m2 23.75 kg/m2 23.75 kg/m2    Physical Exam Constitutional:      Appearance: Normal appearance.  HENT:     Right Ear: There is impacted cerumen.     Left Ear: There is impacted cerumen.     Nose: Congestion present.     Comments: No blood in nose. No scabs in nose.     Mouth/Throat:     Pharynx: No oropharyngeal exudate or posterior oropharyngeal erythema.  Neck:     Vascular: No carotid bruit.  Cardiovascular:     Rate and Rhythm: Normal rate and regular rhythm.     Pulses: Normal pulses.     Heart sounds: Normal heart sounds.  Pulmonary:     Effort: Pulmonary effort is normal.     Breath sounds: Normal breath sounds.  Abdominal:     General: Bowel sounds are normal.     Palpations: Abdomen is soft. There is no mass.     Tenderness: There is no abdominal tenderness.  Neurological:     Mental Status: He is alert and oriented to person, place, and time.  Psychiatric:        Mood and Affect: Mood normal.        Behavior: Behavior normal.     Diabetic Foot Exam - Simple   No data filed      Lab Results  Component Value Date   WBC 7.3 07/19/2022   HGB 13.4 07/19/2022   HCT 39.1 07/19/2022   PLT 167 07/19/2022   GLUCOSE 103 (H) 07/19/2022   CHOL 112 07/19/2022   TRIG 49 07/19/2022   HDL 61 07/19/2022   LDLCALC 39 07/19/2022   ALT 7 07/19/2022   AST 21 07/19/2022   NA 140 07/19/2022   K 4.7 07/19/2022   CL 101 07/19/2022   CREATININE 0.75 (L) 07/19/2022   BUN 17 07/19/2022   CO2 27 07/19/2022   TSH 1.280 01/14/2022   INR 1.58 05/01/2018      Assessment & Plan:   Problem List Items Addressed This Visit       Cardiovascular and Mediastinum   Chronic diastolic CHF  (congestive heart failure) ([place])    Management per specialist.      Hypertensive heart disease with chronic diastolic congestive heart failure ([place]) - Primary    Well controlled.  No changes to medicines. carvedilol 25 mg bid, spironolactone 25 mg 1/2 daily Continue to work on eating a healthy diet and exercise.  Labs drawn today.        Relevant Orders   Comprehensive metabolic panel (Completed)   CBC with Differential/Platelet (Completed)   Cardiovascular Risk Assessment (Completed)   Atherosclerosis of aorta (HCC)    Continue statin and aspirin.        Respiratory   Allergic rhinitis due to allergen    Hold flonase and benadryl. Use Nasal saline.  If persistent dark sputum, call back and will order a chest xray.         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       Cerumen impaction     Use debrox drops for ear wax. Do both ears. Call back the week after Thanksgiving to set up a nurse visit for ear irrigation.          Genitourinary   BPH (benign prostatic hyperplasia)    Stop alfuzosin. Increase tamsulosin to 0.4 mg twice daily        Relevant Medications   tamsulosin (FLOMAX) 0.4 MG CAPS capsule     Other   Mixed hyperlipidemia    Well controlled.  No changes to medicines.  Continue to work on eating a healthy diet and exercise.  Labs drawn today.        Relevant Orders   Lipid panel (Completed)   Vitamin D deficiency    Check labs      B12 deficiency   Moderate recurrent major depression ([place])    The current medical regimen is effective;  continue present plan and medications.       Abnormal sputum color    Hold flonase and benadryl. Use Nasal saline.  If persistent dark sputum, call back and will order a chest xray.       Tobacco use    Recommend cessation. Patient refused at this time.      . Follow-up: Return in about 6 months (around 01/17/2023) for chronic fasting.  An After Visit  Summary was printed and given to the patient.  Rochel Brome, MD [facility] [phone]

## 2022-07-19 NOTE — Assessment & Plan Note (Signed)
Check labs 

## 2022-07-20 LAB — COMPREHENSIVE METABOLIC PANEL
ALT: 7 IU/L (ref 0–44)
AST: 21 IU/L (ref 0–40)
Albumin/Globulin Ratio: 1.8 (ref 1.2–2.2)
Albumin: 4.3 g/dL (ref 3.8–4.8)
Alkaline Phosphatase: 77 IU/L (ref 44–121)
BUN/Creatinine Ratio: 23 (ref 10–24)
BUN: 17 mg/dL (ref 8–27)
Bilirubin Total: 0.8 mg/dL (ref 0.0–1.2)
CO2: 27 mmol/L (ref 20–29)
Calcium: 9.9 mg/dL (ref 8.6–10.2)
Chloride: 101 mmol/L (ref 96–106)
Creatinine, Ser: 0.75 mg/dL — ABNORMAL LOW (ref 0.76–1.27)
Globulin, Total: 2.4 g/dL (ref 1.5–4.5)
Glucose: 103 mg/dL — ABNORMAL HIGH (ref 70–99)
Potassium: 4.7 mmol/L (ref 3.5–5.2)
Sodium: 140 mmol/L (ref 134–144)
Total Protein: 6.7 g/dL (ref 6.0–8.5)
eGFR: 95 mL/min/{1.73_m2} (ref >59)

## 2022-07-20 LAB — CBC WITH DIFFERENTIAL/PLATELET
Basophils Absolute: 0 10*3/uL (ref 0.0–0.2)
Basos: 0 %
EOS (ABSOLUTE): 0.2 10*3/uL (ref 0.0–0.4)
Eos: 3 %
Hematocrit: 39.1 % (ref 37.5–51.0)
Hemoglobin: 13.4 g/dL (ref 13.0–17.7)
Immature Grans (Abs): 0 10*3/uL (ref 0.0–0.1)
Immature Granulocytes: 0 %
Lymphocytes Absolute: 1.8 10*3/uL (ref 0.7–3.1)
Lymphs: 24 %
MCH: 34.3 pg — ABNORMAL HIGH (ref 26.6–33.0)
MCHC: 34.3 g/dL (ref 31.5–35.7)
MCV: 100 fL — ABNORMAL HIGH (ref 79–97)
Monocytes Absolute: 0.6 10*3/uL (ref 0.1–0.9)
Monocytes: 8 %
Neutrophils Absolute: 4.7 10*3/uL (ref 1.4–7.0)
Neutrophils: 65 %
Platelets: 167 10*3/uL (ref 150–450)
RBC: 3.91 x10E6/uL — ABNORMAL LOW (ref 4.14–5.80)
RDW: 12.2 % (ref 11.6–15.4)
WBC: 7.3 10*3/uL (ref 3.4–10.8)

## 2022-07-20 LAB — LIPID PANEL
Chol/HDL Ratio: 1.8 ratio (ref 0.0–5.0)
Cholesterol, Total: 112 mg/dL (ref 100–199)
HDL: 61 mg/dL (ref >39)
LDL Chol Calc (NIH): 39 mg/dL (ref 0–99)
Triglycerides: 49 mg/dL (ref 0–149)
VLDL Cholesterol Cal: 12 mg/dL (ref 5–40)

## 2022-07-20 LAB — CARDIOVASCULAR RISK ASSESSMENT

## 2022-07-21 NOTE — Progress Notes (Signed)
Blood count normal.  Liver function normal.  Kidney function normal.  Cholesterol: good  

## 2022-07-22 ENCOUNTER — Ambulatory Visit: Payer: Medicare Other | Admitting: Family Medicine

## 2022-07-22 ENCOUNTER — Encounter: Payer: Self-pay | Admitting: Family Medicine

## 2022-07-22 DIAGNOSIS — H6120 Impacted cerumen, unspecified ear: Secondary | ICD-10-CM

## 2022-07-22 DIAGNOSIS — Z720 Tobacco use: Secondary | ICD-10-CM

## 2022-07-22 DIAGNOSIS — R093 Abnormal sputum: Secondary | ICD-10-CM | POA: Insufficient documentation

## 2022-07-22 DIAGNOSIS — J309 Allergic rhinitis, unspecified: Secondary | ICD-10-CM | POA: Insufficient documentation

## 2022-07-22 HISTORY — DX: Abnormal sputum: R09.3

## 2022-07-22 HISTORY — DX: Impacted cerumen, unspecified ear: H61.20

## 2022-07-22 HISTORY — DX: Tobacco use: Z72.0

## 2022-07-22 HISTORY — DX: Allergic rhinitis, unspecified: J30.9

## 2022-07-22 NOTE — Assessment & Plan Note (Addendum)
Hold flonase and benadryl. Use Nasal saline.  If persistent dark sputum, call back and will order a chest xray.

## 2022-07-22 NOTE — Assessment & Plan Note (Signed)
Hold flonase and benadryl. Use Nasal saline.  If persistent dark sputum, call back and will order a chest xray.

## 2022-07-22 NOTE — Assessment & Plan Note (Signed)
Recommend cessation. Patient refused at this time.

## 2022-07-22 NOTE — Assessment & Plan Note (Signed)
  Use debrox drops for ear wax. Do both ears. Call back the week after Thanksgiving to set up a nurse visit for ear irrigation.

## 2022-07-23 ENCOUNTER — Telehealth: Payer: Self-pay | Admitting: Cardiology

## 2022-07-23 ENCOUNTER — Other Ambulatory Visit: Payer: Self-pay

## 2022-07-23 NOTE — Telephone Encounter (Signed)
Called the patient and verified that he wanted his prescriptions to go through [facility]. Also verified that [facility] was not in his list of pharmacies. Patient was appreciative for the call and had no further questions at this time.

## 2022-07-23 NOTE — Telephone Encounter (Signed)
Patient is calling requesting if we receive a refill request from [facility] to not fill it. He is wanting to use [facility] only.

## 2022-07-30 ENCOUNTER — Other Ambulatory Visit: Payer: Self-pay

## 2022-07-30 MED ORDER — CARIPRAZINE HCL 1.5 MG PO CAPS: 1.5000 mg | ORAL_CAPSULE | Freq: Every day | ORAL | 0 refills | Status: DC

## 2022-07-31 ENCOUNTER — Telehealth: Payer: Self-pay

## 2022-07-31 NOTE — Chronic Care Management (AMB) (Signed)
Faxed 2024 re-enrollment application to [facility] assist for Vrylar.   Pattricia Boss, [place] Pharmacist Assistant [phone]

## 2022-08-05 ENCOUNTER — Encounter: Payer: Self-pay | Admitting: Psychiatry

## 2022-08-05 ENCOUNTER — Other Ambulatory Visit: Payer: Self-pay | Admitting: Family Medicine

## 2022-08-05 ENCOUNTER — Ambulatory Visit (INDEPENDENT_AMBULATORY_CARE_PROVIDER_SITE_OTHER): Payer: Medicare Other | Admitting: Psychiatry

## 2022-08-05 VITALS — BP 120/79 | HR 69 | Temp 98.2°F | Wt 187.6 lb

## 2022-08-05 DIAGNOSIS — F331 Major depressive disorder, recurrent, moderate: Secondary | ICD-10-CM | POA: Diagnosis not present

## 2022-08-05 MED ORDER — BUPROPION HCL ER (XL) 150 MG PO TB24: 150.0000 mg | ORAL_TABLET | Freq: Every day | ORAL | 0 refills | Status: DC
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
## 2022-08-05 NOTE — Patient Instructions (Signed)
Continue duloxetine 60 mg twice a day Continue Vraylar 1.5 mg daily  Start bupropion 150 mg daily  Next appointment: 1/29 at 11:30

## 2022-08-06 ENCOUNTER — Telehealth: Payer: Self-pay

## 2022-08-06 NOTE — Progress Notes (Signed)
Chronic Care Management Pharmacy Assistant   Name: Travis Reese  MRN: [id number] DOB: 10-Jul-1946   Reason for Encounter: General Adherence Call    Recent office visits:  07/19/22 Rochel Brome MD. Seen for routine visit. Stop alfuzosin.Increase tamsulosin to 0.4 mg twice daily   Recent consult visits:  08/05/22 (Psychiatric) Norman Clay MD. Seen for Major Depressive Disorder. Started on Bupropion HCI '150mg'$  daily.  06/25/22 (Neurology) Jonnie Finner CMA. Orders Only. Ordered Levodopa '42mg'$   Hospital visits:  None  Medications: Outpatient Encounter Medications as of 08/06/2022  Medication Sig   acetaminophen (TYLENOL) 325 MG tablet Take 2 tablets (650 mg total) by mouth every 6 (six) hours as needed for mild pain (or Fever >/= 101).   albuterol (VENTOLIN HFA) 108 (90 Base) MCG/ACT inhaler Inhale 1-2 puffs into the lungs every 6 (six) hours as needed for wheezing or shortness of breath.   aspirin EC 325 MG EC tablet Take 1 tablet (325 mg total) by mouth daily.   atorvastatin (LIPITOR) 20 MG tablet TAKE 1 TABLET BY MOUTH DAILY (Patient taking differently: Take 20 mg by mouth daily.)   buPROPion (WELLBUTRIN XL) 150 MG 24 hr tablet Take 1 tablet (150 mg total) by mouth daily.   carbidopa-levodopa (SINEMET CR) 50-200 MG tablet Take 1 tablet by mouth at bedtime.   carbidopa-levodopa (SINEMET IR) 25-100 MG tablet TAKE 2 TABLETS BY MOUTH 3 TIMES  DAILY AT 9AM, 1PM, AND 5PM (Patient taking differently: Take 2 tablets by mouth 3 (three) times daily.)   cariprazine (VRAYLAR) 1.5 MG capsule Take 1 capsule (1.5 mg total) by mouth daily.   carvedilol (COREG) 25 MG tablet TAKE 1 TABLET BY MOUTH  TWICE DAILY WITH A MEAL   celecoxib (CELEBREX) 200 MG capsule TAKE 1 CAPSULE BY MOUTH DAILY   clonazePAM (KLONOPIN) 1 MG tablet TAKE 1 TABLET BY MOUTH NIGHTLY AND 1 DAILY AS NEEDED FOR ANXIETY   Cyanocobalamin 1000 MCG SUBL Place 1,000 mcg under the tongue daily.   DULoxetine (CYMBALTA) 60 MG capsule  TAKE 1 CAPSULE BY MOUTH TWICE  DAILY (Patient taking differently: Take 60 mg by mouth 2 (two) times daily.)   fluticasone (FLONASE) 50 MCG/ACT nasal spray Place 1 spray into both nostrils 3 times/day as needed-between meals & bedtime. (Patient taking differently: Place 1 spray into both nostrils 3 times/day as needed-between meals & bedtime for allergies or rhinitis.)   KRILL OIL PO Take 1 tablet by mouth daily.   Levodopa (INBRIJA) 42 MG CAPS Samples of this drug were given to the patient, quantity 1, Lot Number [id number] exp 08/03/2023   Melatonin 10 MG CAPS Take 10 mg by mouth at bedtime.   montelukast (SINGULAIR) 10 MG tablet TAKE 1 TABLET BY MOUTH AT  BEDTIME (Patient taking differently: Take 10 mg by mouth at bedtime.)   spironolactone (ALDACTONE) 25 MG tablet TAKE ONE-HALF TABLET BY MOUTH  DAILY (Patient taking differently: Take 12.5 mg by mouth daily.)   tamsulosin (FLOMAX) 0.4 MG CAPS capsule Take 1 capsule (0.4 mg total) by mouth in the morning and at bedtime.   Vitamin D, Ergocalciferol, (DRISDOL) 1.25 MG (50000 UNIT) CAPS capsule TAKE 1 CAPSULE BY MOUTH EVERY  MONDAY (Patient taking differently: Take 50,000 Units by mouth every 7 (seven) days.)   Zinc 50 MG TABS Take 50 mg by mouth at bedtime.   No facility-administered encounter medications on file as of 08/06/2022.    [facility] for General Review Call   Chart Review:  Have  there been any documented new, changed, or discontinued medications since last visit? Yes  Has there been any documented recent hospitalizations or ED visits since last visit with Clinical Pharmacist? No Brief Summary (including medication and/or Diagnosis changes):   Adherence Review:  Does the Clinical Pharmacist Assistant have access to adherence rates? Yes Adherence rates for STAR metric medications (List medication(s)/day supply/ last 2 fill dates).Atorvastatin '20mg'$  06/25/22-04/04/22 100ds Adherence rates for medications indicated for  disease state being reviewed (List medication(s)/day supply/ last 2 fill dates). Does the patient have >5 day gap between last estimated fill dates for any of the above medications or other medication gaps? No Reason for medication gaps. Do you receive your medications through PAP? Yes, Vraylar If Yes, what is the status? Approved  Last received? Pt stated a shipment is in process and received samples from office until shipment gets here   Disease State Questions:  Able to connect with Patient? Yes Did patient have any problems with their health recently? No  Have you had any admissions or emergency room visits or worsening of your condition(s) since last visit? No  Have you had any visits with new specialists or providers since your last visit? No  Have you had any new health care problem(s) since your last visit? No  Have you run out of any of your medications since you last spoke with clinical pharmacist? No  Are there any medications you are not taking as prescribed? No  Are you having any issues or side effects with your medications? No  Do you have any other health concerns or questions you want to discuss with your Clinical Pharmacist before your next visit? No  Are there any health concerns that you feel we can do a better job addressing? No  Are you having any problems with any of the following since the last visit: (select all that apply)  None  Details: 12. Any falls since last visit? No  Details: 13. Any increased or uncontrolled pain since last visit? No  Details: 14. Next visit Type: telephone       Visit with: Rhae Hammock LPN        LYYT:[birth year]        Time:10:00am  15. Additional Details? No   Elray Mcgregor, [facility] Catering manager  [phone]

## 2022-08-12 ENCOUNTER — Ambulatory Visit: Payer: Medicare Other | Admitting: Psychiatry

## 2022-08-20 ENCOUNTER — Ambulatory Visit (INDEPENDENT_AMBULATORY_CARE_PROVIDER_SITE_OTHER): Payer: Medicare Other

## 2022-08-20 ENCOUNTER — Telehealth: Payer: Self-pay | Admitting: Neurology

## 2022-08-20 VITALS — BP 132/80 | HR 65 | Resp 16 | Ht 74.0 in | Wt 188.0 lb

## 2022-08-20 DIAGNOSIS — Z0000 Encounter for general adult medical examination without abnormal findings: Secondary | ICD-10-CM

## 2022-08-20 NOTE — Telephone Encounter (Signed)
Patient left message with after hour service on 08-20-22 at 1:05   Patient needs to speak to [facility] about his inhaler not working

## 2022-08-20 NOTE — Patient Instructions (Signed)
Travis Reese , Thank you for taking time to come for your Medicare Wellness Visit. I appreciate your ongoing commitment to your health goals. Please review the following plan we discussed and let me know if I can assist you in the future.   Screening recommendations/referrals: Recommended yearly ophthalmology/optometry visit for glaucoma screening and checkup Recommended yearly dental visit for hygiene and checkup  Vaccinations: Influenza vaccine: up-to-date Pneumococcal vaccine: up-to-date Tdap vaccine: Due - get at the pharmacy Shingles vaccine: up-to-date    Advanced directives: Please bring a copy once complete   Preventive Care 65 Years and Older, Male Preventive care refers to lifestyle choices and visits with your health care provider that can promote health and wellness. What does preventive care include? A yearly physical exam. This is also called an annual well check. Dental exams once or twice a year. Routine eye exams. Ask your health care provider how often you should have your eyes checked. Personal lifestyle choices, including: Daily care of your teeth and gums. Regular physical activity. Eating a healthy diet. Avoiding tobacco and drug use. Limiting alcohol use. Practicing safe sex. Taking low doses of aspirin every day. Taking vitamin and mineral supplements as recommended by your health care provider. What happens during an annual well check? The services and screenings done by your health care provider during your annual well check will depend on your age, overall health, lifestyle risk factors, and family history of disease. Counseling  Your health care provider may ask you questions about your: Alcohol use. Tobacco use. Drug use. Emotional well-being. Home and relationship well-being. Sexual activity. Eating habits. History of falls. Memory and ability to understand (cognition). Work and work Statistician. Screening  You may have the following tests or  measurements: Height, weight, and BMI. Blood pressure. Lipid and cholesterol levels. These may be checked every 5 years, or more frequently if you are over 31 years old. Skin check. Lung cancer screening. You may have this screening every year starting at age 12 if you have a 30-pack-year history of smoking and currently smoke or have quit within the past 15 years. Fecal occult blood test (FOBT) of the stool. You may have this test every year starting at age 86. Flexible sigmoidoscopy or colonoscopy. You may have a sigmoidoscopy every 5 years or a colonoscopy every 10 years starting at age 77. Prostate cancer screening. Recommendations will vary depending on your family history and other risks. Hepatitis C blood test. Hepatitis B blood test. Sexually transmitted disease (STD) testing. Diabetes screening. This is done by checking your blood sugar (glucose) after you have not eaten for a while (fasting). You may have this done every 1-3 years. Abdominal aortic aneurysm (AAA) screening. You may need this if you are a current or former smoker. Osteoporosis. You may be screened starting at age 75 if you are at high risk. Talk with your health care provider about your test results, treatment options, and if necessary, the need for more tests. Vaccines  Your health care provider may recommend certain vaccines, such as: Influenza vaccine. This is recommended every year. Tetanus, diphtheria, and acellular pertussis (Tdap, Td) vaccine. You may need a Td booster every 10 years. Zoster vaccine. You may need this after age 11. Pneumococcal 13-valent conjugate (PCV13) vaccine. One dose is recommended after age 55. Pneumococcal polysaccharide (PPSV23) vaccine. One dose is recommended after age 76. Talk to your health care provider about which screenings and vaccines you need and how often you need them. This information is not  intended to replace advice given to you by your health care provider. Make sure  you discuss any questions you have with your health care provider. Document Released: 09/15/2015 Document Revised: 05/08/2016 Document Reviewed: 06/20/2015 Elsevier Interactive Patient Education  2017 [place] Prevention in the Home Falls can cause injuries. They can happen to people of all ages. There are many things you can do to make your home safe and to help prevent falls. What can I do on the outside of my home? Regularly fix the edges of walkways and driveways and fix any cracks. Remove anything that might make you trip as you walk through a door, such as a raised step or threshold. Trim any bushes or trees on the path to your home. Use bright outdoor lighting. Clear any walking paths of anything that might make someone trip, such as rocks or tools. Regularly check to see if handrails are loose or broken. Make sure that both sides of any steps have handrails. Any raised decks and porches should have guardrails on the edges. Have any leaves, snow, or ice cleared regularly. Use sand or salt on walking paths during winter. Clean up any spills in your garage right away. This includes oil or grease spills. What can I do in the bathroom? Use night lights. Install grab bars by the toilet and in the tub and shower. Do not use towel bars as grab bars. Use non-skid mats or decals in the tub or shower. If you need to sit down in the shower, use a plastic, non-slip stool. Keep the floor dry. Clean up any water that spills on the floor as soon as it happens. Remove soap buildup in the tub or shower regularly. Attach bath mats securely with double-sided non-slip rug tape. Do not have throw rugs and other things on the floor that can make you trip. What can I do in the bedroom? Use night lights. Make sure that you have a light by your bed that is easy to reach. Do not use any sheets or blankets that are too big for your bed. They should not hang down onto the floor. Have a firm  chair that has side arms. You can use this for support while you get dressed. Do not have throw rugs and other things on the floor that can make you trip. What can I do in the kitchen? Clean up any spills right away. Avoid walking on wet floors. Keep items that you use a lot in easy-to-reach places. If you need to reach something above you, use a strong step stool that has a grab bar. Keep electrical cords out of the way. Do not use floor polish or wax that makes floors slippery. If you must use wax, use non-skid floor wax. Do not have throw rugs and other things on the floor that can make you trip. What can I do with my stairs? Do not leave any items on the stairs. Make sure that there are handrails on both sides of the stairs and use them. Fix handrails that are broken or loose. Make sure that handrails are as long as the stairways. Check any carpeting to make sure that it is firmly attached to the stairs. Fix any carpet that is loose or worn. Avoid having throw rugs at the top or bottom of the stairs. If you do have throw rugs, attach them to the floor with carpet tape. Make sure that you have a light switch at the top of the stairs  and the bottom of the stairs. If you do not have them, ask someone to add them for you. What else can I do to help prevent falls? Wear shoes that: Do not have high heels. Have rubber bottoms. Are comfortable and fit you well. Are closed at the toe. Do not wear sandals. If you use a stepladder: Make sure that it is fully opened. Do not climb a closed stepladder. Make sure that both sides of the stepladder are locked into place. Ask someone to hold it for you, if possible. Clearly mark and make sure that you can see: Any grab bars or handrails. First and last steps. Where the edge of each step is. Use tools that help you move around (mobility aids) if they are needed. These include: Canes. Walkers. Scooters. Crutches. Turn on the lights when you go  into a dark area. Replace any light bulbs as soon as they burn out. Set up your furniture so you have a clear path. Avoid moving your furniture around. If any of your floors are uneven, fix them. If there are any pets around you, be aware of where they are. Review your medicines with your doctor. Some medicines can make you feel dizzy. This can increase your chance of falling. Ask your doctor what other things that you can do to help prevent falls. This information is not intended to replace advice given to you by your health care provider. Make sure you discuss any questions you have with your health care provider. Document Released: 06/15/2009 Document Revised: 01/25/2016 Document Reviewed: 09/23/2014 Elsevier Interactive Patient Education  2017 [employer].

## 2022-08-20 NOTE — Telephone Encounter (Signed)
Called patient and gave him the nurse line and if they can't help him to please call me back

## 2022-08-20 NOTE — Progress Notes (Signed)
Subjective:   Travis Reese is a 76 y.o. male who presents for Medicare Annual/Subsequent preventive examination.  This wellness visit is conducted by a nurse.  The patient's medications were reviewed and reconciled since the patient's last visit.  History details were provided by the patient.  The history appears to be reliable.    Medical History: Patient history and Family history was reviewed  Medications, Allergies, and preventative health maintenance was reviewed and updated.  Cardiac Risk Factors include: advanced age (>71mn, >>14women);male gender;smoking/ tobacco exposure     Objective:    Today's Vitals   08/20/22 1551  BP: 132/80  Pulse: 65  Resp: 16  SpO2: 96%  Weight: 188 lb (85.3 kg)  Height: '6\' 2"'$  (1.88 m)   Body mass index is 24.14 kg/m.     08/20/2022    3:59 PM 06/06/2022    2:55 PM 11/29/2021    8:52 AM 08/10/2021   11:00 AM 04/13/2021    9:19 AM 02/23/2021    9:54 AM 01/20/2021    2:03 AM  Advanced Directives  Does Patient Have a Medical Advance Directive? No Yes No No No No No  Would patient like information on creating a medical advance directive? Yes (MAU/Ambulatory/Procedural Areas - Information given)   No - Patient declined No - Patient declined  No - Patient declined    Current Medications (verified) Outpatient Encounter Medications as of 08/20/2022  Medication Sig   acetaminophen (TYLENOL) 325 MG tablet Take 2 tablets (650 mg total) by mouth every 6 (six) hours as needed for mild pain (or Fever >/= 101).   albuterol (VENTOLIN HFA) 108 (90 Base) MCG/ACT inhaler Inhale 1-2 puffs into the lungs every 6 (six) hours as needed for wheezing or shortness of breath.   aspirin EC 325 MG EC tablet Take 1 tablet (325 mg total) by mouth daily.   atorvastatin (LIPITOR) 20 MG tablet TAKE 1 TABLET BY MOUTH DAILY (Patient taking differently: Take 20 mg by mouth daily.)   buPROPion (WELLBUTRIN XL) 150 MG 24 hr tablet Take 1 tablet (150 mg total) by mouth daily.    carbidopa-levodopa (SINEMET CR) 50-200 MG tablet Take 1 tablet by mouth at bedtime.   carbidopa-levodopa (SINEMET IR) 25-100 MG tablet TAKE 2 TABLETS BY MOUTH 3 TIMES  DAILY AT 9AM, 1PM, AND 5PM (Patient taking differently: Take 2 tablets by mouth 3 (three) times daily.)   cariprazine (VRAYLAR) 1.5 MG capsule Take 1 capsule (1.5 mg total) by mouth daily.   carvedilol (COREG) 25 MG tablet TAKE 1 TABLET BY MOUTH  TWICE DAILY WITH A MEAL   celecoxib (CELEBREX) 200 MG capsule TAKE 1 CAPSULE BY MOUTH DAILY   clonazePAM (KLONOPIN) 1 MG tablet TAKE 1 TABLET BY MOUTH NIGHTLY AND 1 DAILY AS NEEDED FOR ANXIETY   Cyanocobalamin 1000 MCG SUBL Place 1,000 mcg under the tongue daily.   DULoxetine (CYMBALTA) 60 MG capsule TAKE 1 CAPSULE BY MOUTH TWICE  DAILY (Patient taking differently: Take 60 mg by mouth 2 (two) times daily.)   fluticasone (FLONASE) 50 MCG/ACT nasal spray Place 1 spray into both nostrils 3 times/day as needed-between meals & bedtime. (Patient taking differently: Place 1 spray into both nostrils 3 times/day as needed-between meals & bedtime for allergies or rhinitis.)   KRILL OIL PO Take 1 tablet by mouth daily.   Levodopa (INBRIJA) 42 MG CAPS Samples of this drug were given to the patient, quantity 1, Lot Number [id number] 08/03/2023   Melatonin 10 MG CAPS  Take 10 mg by mouth at bedtime.   montelukast (SINGULAIR) 10 MG tablet TAKE 1 TABLET BY MOUTH AT  BEDTIME (Patient taking differently: Take 10 mg by mouth at bedtime.)   spironolactone (ALDACTONE) 25 MG tablet TAKE ONE-HALF TABLET BY MOUTH  DAILY (Patient taking differently: Take 12.5 mg by mouth daily.)   tamsulosin (FLOMAX) 0.4 MG CAPS capsule Take 1 capsule (0.4 mg total) by mouth in the morning and at bedtime.   Vitamin D, Ergocalciferol, (DRISDOL) 1.25 MG (50000 UNIT) CAPS capsule TAKE 1 CAPSULE BY MOUTH EVERY  MONDAY (Patient taking differently: Take 50,000 Units by mouth every 7 (seven) days.)   Zinc 50 MG TABS Take 50 mg by mouth  at bedtime.   No facility-administered encounter medications on file as of 08/20/2022.    Allergies (verified) Patient has no known allergies.   History: Past Medical History:  Diagnosis Date   Allergic rhinosinusitis    Anxiety    Aortic valve sclerosis    Arthritis    knees, neck, shoulder   CHF (congestive heart failure) (HCC)    Cubital tunnel syndrome    DDD (degenerative disc disease), cervical    Depression    Elevated PSA    denies per patient   HTN (hypertension)    Hyperlipidemia    Hyperplasia of prostate with lower urinary tract symptoms (LUTS)    Hypertension    Mild dilation of ascending aorta (HCC)    Parkinson disease    Prolapsed internal hemorrhoids, grade 3 04/19/2015   Rectal prolapse    Scoliosis of lumbosacral spine    Stroke ([place])    x2   Tremor    Vitamin D deficiency    Wears hearing aid    bilateral   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 ENDOSCOPY;  Service: Cardiovascular;;   CATARACT EXTRACTION Bilateral 04/2018   COLONOSCOPY  2007   NASAL SINUS SURGERY  2006   RIGHT/LEFT HEART CATH AND CORONARY ANGIOGRAPHY N/A 04/27/2018   Procedure: RIGHT/LEFT HEART CATH AND CORONARY ANGIOGRAPHY;  Surgeon: Jolaine Artist, MD;  Location: [place] [facility];  Service: Cardiovascular;  Laterality: N/A;   TEE WITHOUT CARDIOVERSION N/A 04/27/2018   Procedure: TRANSESOPHAGEAL ECHOCARDIOGRAM (TEE);  Surgeon: Jolaine Artist, MD;  Location: [facility] ENDOSCOPY;  Service: Cardiovascular;  Laterality: N/A;   TEE WITHOUT CARDIOVERSION N/A 05/01/2018   Procedure:  TRANSESOPHAGEAL ECHOCARDIOGRAM (TEE);  Surgeon: Rexene Alberts, MD;  Location: [place];  Service: Open Heart Surgery;  Laterality: N/A;   TEE WITHOUT CARDIOVERSION N/A 04/13/2021   Procedure: TRANSESOPHAGEAL ECHOCARDIOGRAM (TEE);  Surgeon: Buford Dresser, MD;  Location: [facility] ENDOSCOPY;  Service: Cardiovascular;  Laterality: N/A;   TRANSANAL HEMORRHOIDAL DEARTERIALIZATION N/A 11/15/2015   Procedure: TRANSANAL HEMORRHOIDAL DEARTERIALIZATION;  Surgeon: Leighton Ruff, MD;  Location: [place];  Service: General;  Laterality: N/A;   Family History  Problem Relation Age of Onset   COPD Mother    Heart attack Mother    Colon polyps Father    Prostate cancer Father    Parkinson's disease Father    Hyperlipidemia Father    Depression  Sister    Alcohol abuse Sister    Hypertension Sister    Breast cancer Sister    Hypertension Brother    Prostate cancer Paternal Uncle    Social History   Socioeconomic History   Marital status: Single    Spouse name: Not on file   Number of children: 2   Years of education: Not on file   Highest education level: Not on file  Occupational History   Occupation: retired    Comment: security guard  Tobacco Use   Smoking status: Every Day    Packs/day: 0.15    Years: 50.00    Total pack years: 7.50    Types: Cigarettes   Smokeless tobacco: Never   Tobacco comments:    Patient has cut down to three cigarettes a day  Vaping Use   Vaping Use: Former  Substance and Sexual Activity   Alcohol use: Not Currently    Comment: Occasional   Drug use: No   Sexual activity: Not Currently  Other Topics Concern   Not on file  Social History Narrative   1 son, 1 daughter   Psychologist, occupational work   3 caffeine/day   Right handed   Social Determinants of Health   Financial Resource Strain: [place]  (08/16/2022)   Overall Financial Resource Strain (CARDIA)    Difficulty of Paying Living Expenses: Not hard at all  Food Insecurity: No Food  Insecurity (08/16/2022)   Hunger Vital Sign    Worried About Running Out of Food in the Last Year: Never true    [place] in the Last Year: Never true  Transportation Needs: No Transportation Needs (08/16/2022)   PRAPARE - Hydrologist (Medical): No    Lack of Transportation (Non-Medical): No  Physical Activity: Insufficiently Active (08/16/2022)   Exercise Vital Sign    Days of Exercise per Week: 4 days    Minutes of Exercise per Session: 20 min  Stress: No Stress Concern Present (08/16/2022)   [place]    Feeling of Stress : Not at all  Social Connections: Socially Isolated (08/16/2022)   Social Connection and Isolation Panel [NHANES]    Frequency of Communication with Friends and Family: More than three times a week    Frequency of Social Gatherings with Friends and Family: Once a week    Attends Religious Services: Never    Marine scientist or Organizations: No    Attends Music therapist: Never    Marital Status: Divorced    Tobacco Counseling Ready to quit: Yes Counseling given: Not Answered Tobacco comments: Patient has cut down to three cigarettes a day   Clinical Intake:  Pre-visit preparation completed: Yes     Nutritional Status: BMI of 19-24  Normal Nutritional Risks: None Diabetes: No How often do you need to have someone help you when you read instructions, pamphlets, or other written materials from your doctor or pharmacy?: 2 - Rarely Interpreter Needed?: No    Activities of Daily Living    08/20/2022    4:02 PM 08/16/2022   10:45 AM  In your present state of health, do you have any difficulty performing the following activities:  Hearing? 0 0  Vision? 0 0  Difficulty concentrating or making decisions? 0 0  Walking or climbing stairs? 0 0  Dressing or bathing? 1 1  Doing errands, shopping? 0 0  Preparing Food and eating ?  N N   Using the Toilet? N N  In the past six months, have you accidently leaked urine? Y Y  Do you have problems with loss of bowel control? N N  Managing your Medications? N N  Managing your Finances? N N  Housekeeping or managing your Housekeeping? Tempie Donning    Patient Care Team: Rochel Brome, MD as PCP - General (Internal Medicine) Tat, Eustace Quail, DO as Consulting Physician (Neurology) Park Liter, MD as Consulting Physician (Cardiology) Starling Manns, MD (Orthopedic Surgery) Lane Hacker, [facility] (Pharmacist)     Assessment:   This is a routine wellness examination for [employer].  Hearing/Vision screen No results found.  Dietary issues and exercise activities discussed: Current Exercise Habits: Home exercise routine, Type of exercise: walking, Time (Minutes): 20, Frequency (Times/Week): 5, Weekly Exercise (Minutes/Week): 100, Intensity: Mild   Depression Screen    08/20/2022    4:00 PM 08/05/2022    5:39 PM 07/19/2022    9:46 AM 02/05/2022    9:49 AM 01/14/2022   10:09 AM 10/24/2021   10:34 AM 09/11/2021   10:13 AM  PHQ 2/9 Scores  PHQ - 2 Score '5  6 1 1 6 '$ 0  PHQ- 9 Score '11  10 2 5 14      '$ Information is confidential and restricted. Go to Review Flowsheets to unlock data.    Fall Risk    08/20/2022    4:00 PM 08/16/2022   10:45 AM 07/19/2022    9:46 AM 06/06/2022    2:55 PM 11/29/2021    8:52 AM  Fall Risk   Falls in the past year? 0 0 0 0 0  Number falls in past yr: 0 0 0  0  Injury with Fall? 0 0 0  0  Risk for fall due to : Impaired balance/gait  Impaired balance/gait    Follow up Falls evaluation completed;Education provided  Falls evaluation completed      FALL RISK PREVENTION PERTAINING TO THE HOME:  Any stairs in or around the home? Yes  If so, are there any without handrails? No  Home free of loose throw rugs in walkways, pet beds, electrical cords, etc? Yes  Adequate lighting in your home to reduce risk of falls? Yes   ASSISTIVE DEVICES UTILIZED TO  PREVENT FALLS:  Life alert? No  Use of a cane, walker or w/c? No  Grab bars in the bathroom? Yes  Shower chair or bench in shower? Yes  Elevated toilet seat or a handicapped toilet? No   Gait slow and steady without use of assistive device  Cognitive Function:        08/20/2022    4:03 PM  6CIT Screen  What Year? 0 points  What month? 0 points  What time? 0 points  Count back from 20 0 points  Months in reverse 2 points  Repeat phrase 0 points  Total Score 2 points    Immunizations Immunization History  Administered Date(s) Administered   Fluad Quad(high Dose 65+) 05/15/2022   Influenza, High Dose Seasonal PF 06/16/2016   Influenza,inj,quad, With Preservative 05/24/2019   Influenza-Unspecified 06/29/2021   PFIZER(Purple Top)SARS-COV-2 Vaccination 11/01/2019, 11/30/2019, 08/23/2020   Pfizer Covid-19 Vaccine Bivalent Booster 21yr & up 06/21/2021   Pneumococcal Conjugate-13 10/08/2016   Pneumococcal Polysaccharide-23 08/07/2021   Respiratory Syncytial Virus Vaccine,Recomb Aduvanted(Arexvy) 07/04/2022   Zoster Recombinat (Shingrix) 11/24/2017, 04/02/2018    TDAP status: Due, Education has been provided regarding the importance of this vaccine. Advised may  receive this vaccine at local pharmacy or Health Dept. Aware to provide a copy of the vaccination record if obtained from local pharmacy or Health Dept. Verbalized acceptance and understanding.  Flu Vaccine status: Up to date  Pneumococcal vaccine status: Up to date  Covid-19 vaccine status: Completed vaccines  Qualifies for Shingles Vaccine? Yes   Zostavax completed No   Shingrix Completed?: Yes  Screening Tests Health Maintenance  Topic Date Due   DTaP/Tdap/Td (1 - Tdap) Never done   COLONOSCOPY (Pts 45-42yr Insurance coverage will need to be confirmed)  Never done   COVID-19 Vaccine (5 - 2023-24 season) 05/03/2022   Medicare Annual Wellness (AWV)  08/10/2022   Pneumonia Vaccine 76 Years old  Completed    INFLUENZA VACCINE  Completed   Zoster Vaccines- Shingrix  Completed   HPV VACCINES  Aged Out   Hepatitis C Screening  Discontinued    Health Maintenance  Health Maintenance Due  Topic Date Due   DTaP/Tdap/Td (1 - Tdap) Never done   COLONOSCOPY (Pts 45-488yrInsurance coverage will need to be confirmed)  Never done   COVID-19 Vaccine (5 - 2023-24 season) 05/03/2022   Medicare Annual Wellness (AWV)  08/10/2022    Colorectal cancer screening: Type of screening: Cologuard. Completed 10/29/2021. Repeat every 3 years  Additional Screening:  Vision Screening: Recommended annual ophthalmology exams for early detection of glaucoma and other disorders of the eye. Is the patient up to date with their annual eye exam?  Yes  Who is the provider or what is the name of the office in which the patient attends annual eye exams[facility] If pt is not established with a provider, would they like to be referred to a provider to establish care?  N/A .   Dental Screening: Recommended annual dental exams for proper oral hygiene  Community Resource Referral / Chronic Care Management: CRR required this visit?  No   CCM required this visit?  No      Plan:    1- Tetanus vaccine due - get at the pharmacy 2- Information given on creating an advance directive  I have personally reviewed and noted the following in the patient's chart:   Medical and social history Use of alcohol, tobacco or illicit drugs  Current medications and supplements including opioid prescriptions.  Functional ability and status Nutritional status Physical activity Advanced directives List of other physicians Hospitalizations, surgeries, and ER visits in previous 12 months Vitals Screenings to include cognitive, depression, and falls Referrals and appointments  In addition, I have reviewed and discussed with patient certain preventive protocols, quality metrics, and best practice  recommendations. A written personalized care plan for preventive services as well as general preventive health recommendations were provided to patient.     KiErie NoeLPN   [id number]

## 2022-08-21 NOTE — Addendum Note (Signed)
Addended by: Erie Noe on: 08/21/2022 12:37 PM   Modules accepted: Orders

## 2022-08-22 ENCOUNTER — Ambulatory Visit: Payer: Medicare Other | Admitting: Psychiatry

## 2022-08-28 ENCOUNTER — Other Ambulatory Visit: Payer: Self-pay | Admitting: Family Medicine

## 2022-08-28 DIAGNOSIS — N401 Enlarged prostate with lower urinary tract symptoms: Secondary | ICD-10-CM

## 2022-09-02 ENCOUNTER — Other Ambulatory Visit: Payer: Self-pay | Admitting: Family Medicine

## 2022-09-02 DIAGNOSIS — Z791 Long term (current) use of non-steroidal anti-inflammatories (NSAID): Secondary | ICD-10-CM | POA: Insufficient documentation

## 2022-09-02 DIAGNOSIS — Z8673 Personal history of transient ischemic attack (TIA), and cerebral infarction without residual deficits: Secondary | ICD-10-CM | POA: Insufficient documentation

## 2022-09-04 ENCOUNTER — Other Ambulatory Visit: Payer: Self-pay | Admitting: Cardiology

## 2022-09-04 NOTE — Telephone Encounter (Signed)
Rx refill sent to pharmacy. 

## 2022-09-07 ENCOUNTER — Other Ambulatory Visit: Payer: Self-pay | Admitting: Neurology

## 2022-09-07 DIAGNOSIS — G20A1 Parkinson's disease without dyskinesia, without mention of fluctuations: Secondary | ICD-10-CM

## 2022-09-25 ENCOUNTER — Ambulatory Visit: Payer: Medicare Other

## 2022-09-25 NOTE — Patient Outreach (Signed)
Care Management & Coordination Services Pharmacy Note  09/25/2022 Name:  Travis Reese MRN:  [id number] DOB:  09-20-1945  Summary:   Pleasant 77 year old male presents for F/U CCM visit. He retired as a Animal nutritionist. He likes playing card games on his computer along with some mind puzzles on the [place] website. His son, [name], passed in Sept 2022. He lived outside of [facility] so he still visits his DIL and 2 grandkids who still live there. Patient's sister lives in [place] so he'll visit her there as well too. Patient works out during [employer] while watching his favourite TV shows.  Recommendations/Changes made from today's visit: -None  Subjective: Travis Reese is an 77 y.o. year old male who is a primary patient of Cox, Kirsten, MD.  The care coordination team was consulted for assistance with disease management and care coordination needs.    Engaged with patient by telephone for follow up visit.  Recent office visits:  07/19/22 Rochel Brome MD. Seen for routine visit. Stop alfuzosin.Increase tamsulosin to 0.4 mg twice daily    Recent consult visits:  08/05/22 (Psychiatric) Norman Clay MD. Seen for Major Depressive Disorder. Started on Bupropion HCI '150mg'$  daily.   06/25/22 (Neurology) Jonnie Finner CMA. Orders Only. Ordered Levodopa '42mg'$    Hospital visits:  None   Objective:  Lab Results  Component Value Date   CREATININE 0.75 (L) 07/19/2022   BUN 17 07/19/2022   EGFR 95 07/19/2022   GFRNONAA >60 01/22/2021   GFRAA >60 05/15/2018   NA 140 07/19/2022   K 4.7 07/19/2022   CALCIUM 9.9 07/19/2022   CO2 27 07/19/2022   GLUCOSE 103 (H) 07/19/2022    No results found for: "HGBA1C", "FRUCTOSAMINE", "GFR", "MICROALBUR"  Last diabetic Eye exam: No results found for: "HMDIABEYEEXA"  Last diabetic Foot exam: No results found for: "HMDIABFOOTEX"   Lab Results  Component Value Date   CHOL 112 07/19/2022   HDL 61 07/19/2022   LDLCALC 39  07/19/2022   TRIG 49 07/19/2022   CHOLHDL 1.8 07/19/2022       Latest Ref Rng & Units 07/19/2022   10:29 AM 01/14/2022   10:22 AM 09/11/2021   10:52 AM  Hepatic Function  Total Protein 6.0 - 8.5 g/dL 6.7  6.7  6.4   Albumin 3.8 - 4.8 g/dL 4.3  4.0  4.0   AST 0 - 40 IU/L '21  19  16   '$ ALT 0 - 44 IU/L '7  10  3   '$ Alk Phosphatase 44 - 121 IU/L 77  90  80   Total Bilirubin 0.0 - 1.2 mg/dL 0.8  0.5  0.5     Lab Results  Component Value Date/Time   TSH 1.280 01/14/2022 10:22 AM   TSH 1.240 04/19/2021 10:06 AM       Latest Ref Rng & Units 07/19/2022   10:29 AM 01/14/2022   10:22 AM 09/11/2021   10:52 AM  CBC  WBC 3.4 - 10.8 x10E3/uL 7.3  6.9  6.5   Hemoglobin 13.0 - 17.7 g/dL 13.4  13.8  13.3   Hematocrit 37.5 - 51.0 % 39.1  40.1  38.9   Platelets 150 - 450 x10E3/uL 167  182  173     Lab Results  Component Value Date/Time   VD25OH 62.5 01/14/2022 10:22 AM   VD25OH 53.9 04/19/2021 10:06 AM   VITAMINB12 >2000 (H) 01/14/2022 10:22 AM   VITAMINB12 506 04/19/2021 10:06 AM  Clinical ASCVD: Yes  The ASCVD Risk score (Arnett DK, et al., 2019) failed to calculate for the following reasons:   The patient has a prior MI or stroke diagnosis    Other: (CHADS2VASc if Afib, MMRC or CAT for COPD, ACT, DEXA)     08/20/2022    4:00 PM 08/05/2022    5:39 PM 07/19/2022    9:46 AM  Depression screen PHQ 2/9  Decreased Interest '3 3 3  '$ Down, Depressed, Hopeless '2 2 3  '$ PHQ - 2 Score '5 5 6  '$ Altered sleeping 0 0 0  Tired, decreased energy '3 3 2  '$ Change in appetite 0 0 0  Feeling bad or failure about yourself  '2 2 1  '$ Trouble concentrating '1 1 1  '$ Moving slowly or fidgety/restless 0 3 0  Suicidal thoughts 0 0 0  PHQ-9 Score '11 14 10  '$ Difficult doing work/chores Somewhat difficult Somewhat difficult Somewhat difficult     Social History   Tobacco Use  Smoking Status Every Day   Packs/day: 0.15   Years: 50.00   Total pack years: 7.50   Types: Cigarettes  Smokeless Tobacco Never   Tobacco Comments   Patient has cut down to three cigarettes a day   BP Readings from Last 3 Encounters:  08/20/22 132/80  08/05/22 120/79  07/19/22 116/72   Pulse Readings from Last 3 Encounters:  08/20/22 65  08/05/22 69  07/19/22 72   Wt Readings from Last 3 Encounters:  08/20/22 188 lb (85.3 kg)  08/05/22 187 lb 9.6 oz (85.1 kg)  07/19/22 187 lb (84.8 kg)   BMI Readings from Last 3 Encounters:  08/20/22 24.14 kg/m  08/05/22 24.09 kg/m  07/19/22 24.01 kg/m    No Known Allergies  Medications Reviewed Today     Reviewed by Erie Noe, LPN (Licensed Practical Nurse) on 08/21/22 at 1209  Med List Status: <None>   Medication Order Taking? Sig Documenting Provider Last Dose Status Informant  acetaminophen (TYLENOL) 325 MG tablet [id number] Yes Take 2 tablets (650 mg total) by mouth every 6 (six) hours as needed for mild pain (or Fever >/= 101). Elgergawy, Silver Huguenin, MD Taking Active   albuterol (VENTOLIN HFA) 108 (90 Base) MCG/ACT inhaler [id number] Yes Inhale 1-2 puffs into the lungs every 6 (six) hours as needed for wheezing or shortness of breath. [provider] Taking Active   aspirin EC 325 MG EC tablet [id number] Yes Take 1 tablet (325 mg total) by mouth daily. Barrett, Lodema Hong, PA-C Taking Active Self  atorvastatin (LIPITOR) 20 MG tablet [id number] Yes TAKE 1 TABLET BY MOUTH DAILY  Patient taking differently: Take 20 mg by mouth daily.   Cox, Kirsten, MD Taking Active   buPROPion (WELLBUTRIN XL) 150 MG 24 hr tablet [id number] Yes Take 1 tablet (150 mg total) by mouth daily. Norman Clay, MD Taking Active   carbidopa-levodopa (SINEMET CR) 50-200 MG tablet [id number] Yes Take 1 tablet by mouth at bedtime. Ludwig Clarks, DO Taking Active   carbidopa-levodopa (SINEMET IR) 25-100 MG tablet [id number] Yes TAKE 2 TABLETS BY MOUTH 3 TIMES  DAILY AT 9AM, 1PM, AND 5PM  Patient taking differently: Take 2 tablets by mouth 3 (three) times daily.   Ludwig Clarks, DO  Taking Active   cariprazine (VRAYLAR) 1.5 MG capsule [id number] Yes Take 1 capsule (1.5 mg total) by mouth daily. Cox, Kirsten, MD Taking Active   carvedilol (COREG) 25 MG tablet [id number] Yes TAKE 1 TABLET BY MOUTH  TWICE DAILY  WITH A MEAL Park Liter, MD Taking Active   celecoxib (CELEBREX) 200 MG capsule [id number] Yes TAKE 1 CAPSULE BY MOUTH DAILY Cox, Kirsten, MD Taking Active   clonazePAM (KLONOPIN) 1 MG tablet [id number] Yes TAKE 1 TABLET BY MOUTH NIGHTLY AND 1 DAILY AS NEEDED FOR ANXIETY Cox, Kirsten, MD Taking Active   Cyanocobalamin 1000 MCG SUBL [id number] Yes Place 1,000 mcg under the tongue daily. [provider] Taking Active   DULoxetine (CYMBALTA) 60 MG capsule [id number] Yes TAKE 1 CAPSULE BY MOUTH TWICE  DAILY  Patient taking differently: Take 60 mg by mouth 2 (two) times daily.   Cox, Kirsten, MD Taking Active   fluticasone [facility]) 50 MCG/ACT nasal spray [id number] Yes Place 1 spray into both nostrils 3 times/day as needed-between meals & bedtime.  Patient taking differently: Place 1 spray into both nostrils 3 times/day as needed-between meals & bedtime for allergies or rhinitis.   Rochel Brome, MD Taking Active   KRILL OIL PO [id number] Yes Take 1 tablet by mouth daily. [provider] Taking Active   Levodopa (INBRIJA) 42 MG CAPS [id number] Yes Samples of this drug were given to the patient, quantity 1, Lot Number [id number] exp 08/03/2023 Tat, Eustace Quail, DO Taking Active   Melatonin 10 MG CAPS [id number] Yes Take 10 mg by mouth at bedtime. [provider] Taking Active Self  montelukast (SINGULAIR) 10 MG tablet [id number] Yes TAKE 1 TABLET BY MOUTH AT  BEDTIME  Patient taking differently: Take 10 mg by mouth at bedtime.   Marge Duncans, PA-C Taking Active   spironolactone (ALDACTONE) 25 MG tablet [id number] Yes TAKE ONE-HALF TABLET BY MOUTH  DAILY  Patient taking differently: Take 12.5 mg by mouth daily.   Rip Harbour, NP Taking Active    tamsulosin [facility]) 0.4 MG CAPS capsule [id number] Yes Take 1 capsule (0.4 mg total) by mouth in the morning and at bedtime. Cox, Kirsten, MD Taking Active   Vitamin D, Ergocalciferol, (DRISDOL) 1.25 MG (50000 UNIT) CAPS capsule [id number] Yes TAKE 1 CAPSULE BY MOUTH EVERY  MONDAY  Patient taking differently: Take 50,000 Units by mouth every 7 (seven) days.   Rochel Brome, MD Taking Active   Zinc 50 MG TABS [id number] Yes Take 50 mg by mouth at bedtime. [provider] Taking Active Self            SDOH:  (Social Determinants of Health) assessments and interventions performed: Yes SDOH Interventions    Flowsheet Row Care Coordination from 09/25/2022 in [place] from 08/20/2022 in [place] Office Visit from 08/05/2022 in confidential department Office Visit from 07/19/2022 in [place] Management from 02/20/2022 in [place] Office Visit from 02/05/2022 in [place]  SDOH Interventions        Food Insecurity Interventions -- Intervention Not Indicated -- -- -- --  Housing Interventions -- Intervention Not Indicated -- -- -- --  Transportation Interventions Intervention Not Indicated Intervention Not Indicated -- -- Intervention Not Indicated --  Utilities Interventions -- Intervention Not Indicated -- -- -- --  Alcohol Usage Interventions -- Intervention Not Indicated (Score <7) -- -- -- --  Depression Interventions/Treatment  -- Currently on Treatment Medication Currently on Treatment  [scheduled to see psychiatry in 08/2022] -- Currently on Treatment  Financial Strain Interventions Intervention Not Indicated Intervention Not Indicated -- -- Other (Comment)  Arman Filter PAP] --  Physical Activity Interventions -- Intervention Not  Indicated -- -- -- --  Stress Interventions -- Intervention Not Indicated -- -- -- --  Social Connections Interventions --  Intervention Not Indicated -- -- -- --       Medication Assistance:   Vraylar: -2023: Approved -2024: Approved  Name and location of Current pharmacy:  [facility] ([place], [place] [facility] [place] [address] [place] [address] Phone: [phone] Fax: [phone]  [place], [place] [address] Phone: [phone] Fax: [phone]  [place], [place] [place] [address] [place] [address] Phone: [phone] Fax: [phone]  [facility] - [address], [place] [address] Phone: [phone] Fax: [place] [place] [address] Phone: [phone] Fax: [phone]  [facility] [place], [place] [phone] [place] [address] Phone: [phone] Fax: [phone]   Assessment/Plan  Hypertension (BP goal <140/90) -Managed by Dr. Agustin Cree, Robert BP Readings from Last 3 Encounters:  08/20/22 132/80  08/05/22 120/79  07/19/22 116/72  -Controlled -Current treatment: Carvedilol 25 mg bid with a meal Appropriate, Effective, Safe, Accessible Spironolactone 25 mg take 1/2 daily Appropriate, Effective, Safe, Accessible -Medications previously tried: furosemide, lisinopril-hydrochlorothiazide -Current home readings:  Jan 2024: Last checked 3 weeks ago, 130/70 -Current dietary habits: eats meats and vegetables per patient. Eating 3 meals daily.  -Current exercise habits: very limited. Encouraged him to consider boxing class or other exercise.  -Denies hypotensive/hypertensive symptoms -Educated on BP goals and benefits of medications for prevention  of heart attack, stroke and kidney damage; Daily salt intake goal < 2300 mg; Exercise goal of 150 minutes per week; -Counseled to monitor BP at home as needed, document, and provide log at future appointments -Counseled on diet and exercise extensively Recommended to continue current medication   CAD: (LDL goal < 70) The ASCVD Risk score (Arnett DK, et al., 2019) failed to calculate for the following reasons:   The patient has a prior MI or stroke diagnosis Lab Results  Component Value Date   CHOL 112 07/19/2022   CHOL 99 (L) 01/14/2022   CHOL 130 09/11/2021   Lab Results  Component Value Date   HDL 61 07/19/2022   HDL 48 01/14/2022   HDL 43 09/11/2021   Lab Results  Component Value Date   LDLCALC 39 07/19/2022   LDLCALC 38 01/14/2022   LDLCALC 73 09/11/2021   Lab Results  Component Value Date   TRIG 49 07/19/2022   TRIG 54 01/14/2022   TRIG 66 09/11/2021   Lab Results  Component Value Date   CHOLHDL 1.8 07/19/2022   CHOLHDL 2.1 01/14/2022   CHOLHDL 3.0 09/11/2021   No results found for: "LDLDIRECT" Last vitamin D Lab Results  Component Value Date   VD25OH 62.5 01/14/2022   Lab Results  Component Value Date   TSH 1.280 01/14/2022   -Not ideally controlled -Current treatment: ASA '325mg'$  Appropriate, Effective, Safe, Accessible Atorvastatin '20mg'$  Appropriate, Effective, Safe, Accessible -Medications previously tried: patient has never been on statin  -Current dietary habits: eats meats and vegetables per patient. Eating 3 meals daily.  -Current exercise habits: very  limited. Encouraged him to consider boxing class or other exercise.  -Educated on Cholesterol goals;  Importance of limiting foods high in cholesterol; Exercise goal of 150 minutes per week; -Counseled on diet and exercise extensively June 2023: Clarified with patient that '325mg'$  ASA is correct dose. He stated it was started on that ages ago and every doctor double checks but he's supposed to be on  it   Heart Failure (Goal: manage symptoms and prevent exacerbations) -Managed by Dr. Agustin Cree, Herbie [place] -Controlled -Last ejection fraction: 55-60% (Date: 05/22) -Current treatment: carvedilol 25 mg bid with a meal Appropriate, Effective, Safe, Accessible Spironolactone 25 mg 1/2 tablet daily Appropriate, Effective, Safe, Accessible -Medications previously tried: furosemide, lisinopril-hydrochlorothiazide -Current home BP/HR readings: not checking currently  -Current dietary habits: eats meats and vegetables per patient. Eating 3 meals daily.  -Current exercise habits: very limited. Encouraged him to consider boxing class or other exercise.  -Educated on Benefits of medications for managing symptoms and prolonging life -Counseled on diet and exercise extensively Recommended to continue current medication   Arthritis (Goal: manage symptoms) -Controlled -Current treatment  acetaminophen 325 mg 2 tablets every 6 hours prn mild pain or fever - rarely takes Appropriate, Effective, Safe, Accessible -Medications previously tried:  voltaren, meloxicam, naproxen, oxycodone, tiaznidine, pregabalin Recommended to continue current medication   Depression/Anxiety (Goal: manage symptoms of anxiety) -Controlled -Current treatment: duloxetine 60 mg BID Appropriate, Effective, Safe, Accessible Clonazepam 1 mg daily at bedtime Appropriate, Effective, Safe, Accessible Bupropion '150mg'$  Appropriate, Effective, Query Safe,  Vraylar Appropriate, Effective, Safe, Accessible 2023: PAP Approved 2024: PAP approved -Medications previously tried/failed: abilify, bupropion, seroquel -PHQ9:     08/20/2022    4:00 PM 08/05/2022    5:39 PM 07/19/2022    9:46 AM  Depression screen PHQ 2/9  Decreased Interest '3 3 3  '$ Down, Depressed, Hopeless '2 2 3  '$ PHQ - 2 Score '5 5 6  '$ Altered sleeping 0 0 0  Tired, decreased energy '3 3 2  '$ Change in appetite 0 0 0  Feeling bad or failure about yourself  '2 2 1  '$ Trouble  concentrating '1 1 1  '$ Moving slowly or fidgety/restless 0 3 0  Suicidal thoughts 0 0 0  PHQ-9 Score '11 14 10  '$ Difficult doing work/chores Somewhat difficult Somewhat difficult Somewhat difficult  -GAD7:     08/20/2022    4:00 PM 08/05/2022    5:39 PM 07/19/2022    9:46 AM  Depression screen PHQ 2/9  Decreased Interest '3 3 3  '$ Down, Depressed, Hopeless '2 2 3  '$ PHQ - 2 Score '5 5 6  '$ Altered sleeping 0 0 0  Tired, decreased energy '3 3 2  '$ Change in appetite 0 0 0  Feeling bad or failure about yourself  '2 2 1  '$ Trouble concentrating '1 1 1  '$ Moving slowly or fidgety/restless 0 3 0  Suicidal thoughts 0 0 0  PHQ-9 Score '11 14 10  '$ Difficult doing work/chores Somewhat difficult Somewhat difficult Somewhat difficult   -Educated on Benefits of medication for symptom control Feb 2023: PHQ9 is elevated and patient states he doesn't like Duloxetine, that it isn't helping. Will coordinate with PCP about changing therapy June 2023: Arman Filter is helping per patient but can't afford. Sent patient PAP a few weeks ago, he will bring into office when complete. He also stated that since starting it, he has been sleeping less (Denied vivid dreams). States he was taking HS but changed to AM and sleep pattern closer to normal. Jan 2024: Patient  states he feels better on Bupropion but that it's causing Acid Reflux. He's taking at night. Counseled to try during the day. If that doesn't work, stop the [employer] he's taking (Rebound Acid) and try Famotidine. Counseled to try for a month and if still bothering him to give [place] a call and we'll try something stronger/evaluate   Parkinson's Disease (Goal: improve symptoms - managed by Dr Tat) -Controlled -Current treatment  Sinemet IR 25-100 mg 2 TID Appropriate, Effective, Safe, Accessible Sinemet CR 50-200 mg HS Appropriate, Effective, Safe, Accessible -Medications previously tried: Primidone Feb 2023; Patient shaking more after dose increase. Will ask PCP about changing dose.  Also, spent time counseling patient about going to Support Groups, explained how [employer] helped my grandpa, patient seemed very interested in this.    BPH (Goal: manage symptoms) -Controlled -Night Time Awakenings: 1/night             -Most of patient's issues are in morning -Current treatment  Tamsulosin 0.'4mg'$  2 QD Appropriate, Effective, Safe, Accessible -Medications previously tried:  terazosin, Alfuzosin Feb 2023: Patient unable to take Tadalafil due to cost. Patient read about Alfusosin and would like to try that. Also, spent extensive time explaining Kegel therapy June 2023: Patient would like to go back on Tamsulosin, experiencing side effects with Alfuzosin (Stream starts and stops)   CPP F/U October 2024  [place] Constable, Pharm.D. - [phone]
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
## 2022-09-30 ENCOUNTER — Encounter: Payer: Self-pay | Admitting: Psychiatry

## 2022-09-30 ENCOUNTER — Ambulatory Visit (INDEPENDENT_AMBULATORY_CARE_PROVIDER_SITE_OTHER): Payer: Medicare Other | Admitting: Psychiatry

## 2022-09-30 VITALS — BP 126/82 | HR 78 | Temp 97.8°F | Ht 73.5 in | Wt 188.4 lb

## 2022-09-30 DIAGNOSIS — F330 Major depressive disorder, recurrent, mild: Secondary | ICD-10-CM

## 2022-09-30 NOTE — Patient Instructions (Addendum)
Continue duloxetine 60 mg twice a day Continue Vraylar 1.5 mg daily Change: start bupropion 150 mg daily after breakfast. Please reach out to our office next week to provide an update on how you are doing. Next appointment: 3/28 at 1:30

## 2022-10-06 ENCOUNTER — Other Ambulatory Visit: Payer: Self-pay | Admitting: Psychiatry

## 2022-10-15 ENCOUNTER — Other Ambulatory Visit: Payer: Self-pay

## 2022-10-15 MED ORDER — SPIRONOLACTONE 25 MG PO TABS: 12.5000 mg | ORAL_TABLET | Freq: Every day | ORAL | 3 refills | Status: DC

## 2022-10-18 ENCOUNTER — Other Ambulatory Visit: Payer: Self-pay | Admitting: Family Medicine

## 2022-10-21 ENCOUNTER — Other Ambulatory Visit: Payer: Self-pay

## 2022-10-22 ENCOUNTER — Ambulatory Visit: Payer: Medicare Other | Admitting: Neurology

## 2022-10-22 ENCOUNTER — Other Ambulatory Visit: Payer: Self-pay

## 2022-10-22 MED ORDER — BUPROPION HCL ER (XL) 150 MG PO TB24: 150.0000 mg | ORAL_TABLET | Freq: Every day | ORAL | 0 refills | Status: DC

## 2022-10-30 ENCOUNTER — Telehealth: Payer: Self-pay

## 2022-10-30 NOTE — Progress Notes (Unsigned)
Assessment/Plan:   1.  Parkinsons Disease, worsened by Abilify  -Patient off Abilify since April, 2022  -pt improved when he went off of Abilify.  -Patient now on Vraylar.  In theory, I would like to see the patient off of this given deter receptor blockade.  I talked with both Dr. Tobie Poet (prescriber of the medication) and Dr. Modesta Messing (psychiatrist) about this medication.  Dr. Modesta Messing recognized the issue, but did state that patient seemed to be better on Vraylar from a mood standpoint and patient had limited options.  She did want the patient to consider Te[place]y, but he declined it with her.  I talked to the patient about this today.  He was willing to consider it after we discussed the risks and benefits.  In addition, he really was not wanting to stay on the Vraylar after we talked, but I told him to stay on it for now and he could discuss further with psychiatry.  Discussed that we really need to balance mood and motor symptoms.  -continue carbidopa/levodopa 25/100, 2 tablet 3 times daily, 9am/1pm/5pm  -continue carbidopa/levodopa 50/200 cr at bed to see if helps with first AM on  -motor wise he looks worse today.  Not sure if from the vraylar or if just from progression.  Discussed whether to add more carbidopa/levodopa or to cautiously add a DA.  We are going to try a DA.  Add requip and work to '1mg'$  tid.  R/b/se discussed.  -Would like to see reduction in clonazepam.  Patient currently on clonazepam, 1 mg and goes through 45 tablets/month per PDMP.  I would like to see complete discontinuation of the daytime dosage, due to risk for falls and confusion.  -Continue Inbrija.  Samples provided.  Insurance prior declined it.  We appealed, as insurance wanted him in April morphine which was not appropriate for him due to high risk of hallucinations on that medicine.  We won that appeal but it seems to have gotten dropped after that.  We will check on that. 2.  History of cerebral infarction, with moderate  sized PFO  -Patient is on aspirin  -Patient is on statin.  3.  Essential tremor  --the tremor he describes (eating) is likely from this  -he used to be on primidone but wanted to stop that.  4.  Depression   -Now following with Dr. Modesta Messing.   Subjective:   Travis Reese was seen today in follow up for Parkinsons disease.  My previous records were reviewed prior to todays visit as well as outside records available to me.  Last visit, I referred him to physical therapy and gave him a trial of Inbrija.  He reports today that it seemed to help.  He would use it up to 2 times per day.  He has been out of it a few weeks..  He has had no falls since last visit.  I did refer him to Dr. Modesta Messing last visit for depression.  He saw her on August 05, 2022 and September 30, 2022.  She wanted to try [place] on him, but he declined.  I noted that she wrote he wanted to continue on Vraylar (it turns out he has been on that since May).  She did add bupropion, which has helped.    Current prescribed movement disorder medications: carbidopa/levodopa 25/100, 2 tablets at 9am/1pm/5pm  carbidopa/levodopa 50/200 CR q hs  Inbrija (started last visit).  PREVIOUS MEDICATIONS: Sinemet; primidone (for ET - and did okay  stopping that)  ALLERGIES:  No Known Allergies  CURRENT MEDICATIONS:  Outpatient Encounter Medications as of 10/31/2022  Medication Sig   acetaminophen (TYLENOL) 325 MG tablet Take 2 tablets (650 mg total) by mouth every 6 (six) hours as needed for mild pain (or Fever >/= 101).   albuterol (VENTOLIN HFA) 108 (90 Base) MCG/ACT inhaler Inhale 1-2 puffs into the lungs every 6 (six) hours as needed for wheezing or shortness of breath.   aspirin EC 325 MG EC tablet Take 1 tablet (325 mg total) by mouth daily.   atorvastatin (LIPITOR) 20 MG tablet TAKE 1 TABLET BY MOUTH ONCE  DAILY   buPROPion (WELLBUTRIN XL) 150 MG 24 hr tablet Take 1 tablet (150 mg total) by mouth daily.   carbidopa-levodopa (SINEMET CR)  50-200 MG tablet TAKE 1 TABLET BY MOUTH EVERY  NIGHT AT BEDTIME   carbidopa-levodopa (SINEMET IR) 25-100 MG tablet TAKE 2 TABLETS BY MOUTH 3 TIMES  DAILY AT 9AM, 1PM, AND 5PM (Patient taking differently: Take 2 tablets by mouth 3 (three) times daily.)   cariprazine (VRAYLAR) 1.5 MG capsule Take 1 capsule (1.5 mg total) by mouth daily.   carvedilol (COREG) 25 MG tablet TAKE 1 TABLET BY MOUTH TWICE  DAILY WITH MEALS   celecoxib (CELEBREX) 200 MG capsule TAKE 1 CAPSULE BY MOUTH DAILY   clonazePAM (KLONOPIN) 1 MG tablet TAKE 1 TABLET BY MOUTH AT BEDTIME AND 1 DAILY AS NEEDED FOR ANXIETY   Cyanocobalamin 1000 MCG SUBL Place 1,000 mcg under the tongue daily.   DULoxetine (CYMBALTA) 60 MG capsule TAKE 1 CAPSULE BY MOUTH TWICE  DAILY (Patient taking differently: Take 60 mg by mouth 2 (two) times daily.)   fluticasone (FLONASE) 50 MCG/ACT nasal spray Place 1 spray into both nostrils 3 times/day as needed-between meals & bedtime. (Patient taking differently: Place 1 spray into both nostrils 3 times/day as needed-between meals & bedtime for allergies or rhinitis.)   KRILL OIL PO Take 1 tablet by mouth daily.   Levodopa (INBRIJA) 42 MG CAPS Samples of this drug were given to the patient, quantity 1, Lot Number [id number] exp 08/03/2023   Melatonin 10 MG CAPS Take 10 mg by mouth at bedtime.   montelukast (SINGULAIR) 10 MG tablet TAKE 1 TABLET BY MOUTH AT  BEDTIME (Patient taking differently: Take 10 mg by mouth at bedtime.)   spironolactone (ALDACTONE) 25 MG tablet Take 0.5 tablets (12.5 mg total) by mouth daily.   tamsulosin (FLOMAX) 0.4 MG CAPS capsule TAKE 2 CAPSULES BY MOUTH DAILY  AFTER SUPPER   Vitamin D, Ergocalciferol, (DRISDOL) 1.25 MG (50000 UNIT) CAPS capsule TAKE 1 CAPSULE BY MOUTH EVERY  MONDAY   Zinc 50 MG TABS Take 50 mg by mouth at bedtime.   No facility-administered encounter medications on file as of 10/31/2022.    Objective:   PHYSICAL EXAMINATION:    VITALS:   Vitals:   10/31/22 1141   BP: 122/82  Pulse: 78  SpO2: 99%  Weight: 192 lb 11.2 oz (87.4 kg)  Height: '6\' 2"'$  (1.88 m)    GEN:  The patient appears stated age and is in NAD. HEENT:  Normocephalic, atraumatic.  The mucous membranes are moist. The superficial temporal arteries are without ropiness or tenderness. CV:  RRR Lungs:  CTAB Neck/HEME:  There are no carotid bruits bilaterally.  Neurological examination:  Orientation: The patient is alert and oriented x3. Cranial nerves: There is good facial symmetry with facial hypomimia. The speech is fluent and clear. Soft palate rises symmetrically  and there is no tongue deviation. Hearing is intact to conversational tone. Sensation: Sensation is intact to light touch throughout Motor: Strength is at least antigravity x4.  Movement examination: Tone: There is mild to mod increased tone in the bilateral UE (worse) Abnormal movements: no rest tremor Coordination:  There is mild decremation with RAM's, with any form of RAMS, including alternating supination and pronation of the forearm, hand opening and closing, finger taps, heel taps and toe taps on the bilaterally Gait and Station: The patient has  difficulty arising out of a deep-seated chair without the use of the hands. The patient's stride length is decreased and he is dragging the R leg.    I have reviewed and interpreted the following labs independently    Chemistry      Component Value Date/Time   NA 140 07/19/2022 1029   K 4.7 07/19/2022 1029   CL 101 07/19/2022 1029   CO2 27 07/19/2022 1029   BUN 17 07/19/2022 1029   CREATININE 0.75 (L) 07/19/2022 1029   CREATININE 0.77 07/21/2017 1401      Component Value Date/Time   CALCIUM 9.9 07/19/2022 1029   ALKPHOS 77 07/19/2022 1029   AST 21 07/19/2022 1029   ALT 7 07/19/2022 1029   BILITOT 0.8 07/19/2022 1029       Lab Results  Component Value Date   WBC 7.3 07/19/2022   HGB 13.4 07/19/2022   HCT 39.1 07/19/2022   MCV 100 (H) 07/19/2022   PLT  167 07/19/2022    Lab Results  Component Value Date   TSH 1.280 01/14/2022     Total time spent on today's visit was 69mnutes, including both face-to-face time and nonface-to-face time.  Time included that spent on review of records (prior notes available to me/labs/imaging if pertinent), discussing treatment and goals, answering patient's questions and coordinating care.  Cc:  CRochel Brome MD

## 2022-10-30 NOTE — Telephone Encounter (Addendum)
Error: Referral to Psychiatry Per Dr.Cox

## 2022-10-30 NOTE — Telephone Encounter (Signed)
-----   Message from Rochel Brome, MD sent at 10/30/2022 11:12 AM EST ----- Regarding: medications Please call and ask him to stop vraylar. Neurology is reluctant to have him take vraylar due to previous reaction to abilify.  I would recommend we  increase his wellbutrin xl to 300 mg qam.  Dr. Tobie Poet

## 2022-10-31 ENCOUNTER — Encounter: Payer: Self-pay | Admitting: Neurology

## 2022-10-31 ENCOUNTER — Ambulatory Visit: Payer: Medicare Other | Admitting: Neurology

## 2022-10-31 VITALS — BP 122/82 | HR 78 | Ht 74.0 in | Wt 192.7 lb

## 2022-10-31 DIAGNOSIS — G20A1 Parkinson's disease without dyskinesia, without mention of fluctuations: Secondary | ICD-10-CM

## 2022-10-31 DIAGNOSIS — F331 Major depressive disorder, recurrent, moderate: Secondary | ICD-10-CM | POA: Diagnosis not present

## 2022-10-31 MED ORDER — ROPINIROLE HCL 0.25 MG PO TABS: ORAL_TABLET | ORAL | 0 refills | Status: DC

## 2022-10-31 MED ORDER — ROPINIROLE HCL 1 MG PO TABS: 1.0000 mg | ORAL_TABLET | Freq: Three times a day (TID) | ORAL | 1 refills | Status: DC

## 2022-10-31 NOTE — Patient Instructions (Addendum)
ADD Start requip (ropinirole) as follows:  requip 0.25 mg three times a day x 1 week and 2 tablets three times per day for a week then 3 tablets 3 times a day for a week and then you will take the requip 1 mg dosage, 1 tablet three times per day.  Make an appointment with Dr. Modesta Messing to discuss the Vraylar and the Gisela that we discussed.  The physicians and staff at [facility] are committed to providing excellent care. You may receive a survey requesting feedback about your experience at our office. We strive to receive "very good" responses to the survey questions. If you feel that your experience would prevent you from giving the office a "very good " response, please contact our office to try to remedy the situation. We may be reached at [phone]. Thank you for taking the time out of your busy day to complete the survey.

## 2022-11-09 ENCOUNTER — Other Ambulatory Visit: Payer: Self-pay | Admitting: Neurology

## 2022-11-09 DIAGNOSIS — G20A1 Parkinson's disease without dyskinesia, without mention of fluctuations: Secondary | ICD-10-CM

## 2022-11-11 ENCOUNTER — Other Ambulatory Visit: Payer: Self-pay

## 2022-11-11 MED ORDER — VITAMIN D (ERGOCALCIFEROL) 1.25 MG (50000 UNIT) PO CAPS: ORAL_CAPSULE | ORAL | 2 refills | Status: DC

## 2022-11-12 ENCOUNTER — Telehealth: Payer: Self-pay

## 2022-11-12 NOTE — Progress Notes (Signed)
Care Management & Coordination Services Pharmacy Team  Reason for Encounter: Hypertension  Contacted patient to discuss hypertension disease state.  Unsuccessful outreach. Left voicemail for patient to return call.  Current antihypertensive regimen:  Aspirin EC 325mg  daily Carvedilol 25mg  twice daily  Spironolactone 25mg  0.5mg  daily  Adherence Review: Is the patient currently on ACE/ARB medication? No Does the patient have >5 day gap between last estimated fill dates? No  Star Rating Drugs:  Medication:  Last Fill: Day Supply None noted  Chart Updates: Recent office visits:  10/30/22 Rochel Brome MD. Telephone encounter. I would recommend we increase his wellbutrin xl to 300 mg qam.   Recent consult visits:  10/31/22 (Neurology) Tat, Wells Guiles DO. Seen for Parkinson's. Started on Ropinirole [facility]  0.25 mg three times a day x 1 week and 2 tablets three times per day for a week then 3 tablets 3 times a day for a week and then you will take the requip 1 mg dosage, 1 tablet three times per day.   09/30/22 (Psychiatry) Norman Clay MD. Seen for MDD. Start bupropion 150 mg daily after breakfast.   Hospital visits:  None  Medications: Outpatient Encounter Medications as of 11/12/2022  Medication Sig   acetaminophen (TYLENOL) 325 MG tablet Take 2 tablets (650 mg total) by mouth every 6 (six) hours as needed for mild pain (or Fever >/= 101).   albuterol (VENTOLIN HFA) 108 (90 Base) MCG/ACT inhaler Inhale 1-2 puffs into the lungs every 6 (six) hours as needed for wheezing or shortness of breath.   aspirin EC 325 MG EC tablet Take 1 tablet (325 mg total) by mouth daily.   atorvastatin (LIPITOR) 20 MG tablet TAKE 1 TABLET BY MOUTH ONCE  DAILY   buPROPion (WELLBUTRIN XL) 150 MG 24 hr tablet Take 1 tablet (150 mg total) by mouth daily.   carbidopa-levodopa (SINEMET CR) 50-200 MG tablet TAKE 1 TABLET BY MOUTH EVERY  NIGHT AT BEDTIME   carbidopa-levodopa (SINEMET IR) 25-100 MG tablet TAKE 2  TABLETS BY MOUTH 3 TIMES  DAILY AT 9AM, 1PM, AND 5PM (Patient taking differently: Take 2 tablets by mouth 3 (three) times daily.)   cariprazine (VRAYLAR) 1.5 MG capsule Take 1 capsule (1.5 mg total) by mouth daily.   carvedilol (COREG) 25 MG tablet TAKE 1 TABLET BY MOUTH TWICE  DAILY WITH MEALS   celecoxib (CELEBREX) 200 MG capsule TAKE 1 CAPSULE BY MOUTH DAILY   clonazePAM (KLONOPIN) 1 MG tablet TAKE 1 TABLET BY MOUTH AT BEDTIME AND 1 DAILY AS NEEDED FOR ANXIETY   Cyanocobalamin 1000 MCG SUBL Place 1,000 mcg under the tongue daily.   DULoxetine (CYMBALTA) 60 MG capsule TAKE 1 CAPSULE BY MOUTH TWICE  DAILY (Patient taking differently: Take 60 mg by mouth 2 (two) times daily.)   fluticasone (FLONASE) 50 MCG/ACT nasal spray Place 1 spray into both nostrils 3 times/day as needed-between meals & bedtime. (Patient taking differently: Place 1 spray into both nostrils 3 times/day as needed-between meals & bedtime for allergies or rhinitis.)   KRILL OIL PO Take 1 tablet by mouth daily.   Levodopa (INBRIJA) 42 MG CAPS Samples of this drug were given to the patient, quantity 1, Lot Number [id number] exp 08/03/2023   Melatonin 10 MG CAPS Take 10 mg by mouth at bedtime.   montelukast (SINGULAIR) 10 MG tablet TAKE 1 TABLET BY MOUTH AT  BEDTIME (Patient taking differently: Take 10 mg by mouth at bedtime.)   rOPINIRole (REQUIP) 0.25 MG tablet 1 po tid x 1  week, then 2 po tid x 1 week, then 3 po tid x 1 week   rOPINIRole (REQUIP) 1 MG tablet Take 1 tablet (1 mg total) by mouth 3 (three) times daily.   spironolactone (ALDACTONE) 25 MG tablet Take 0.5 tablets (12.5 mg total) by mouth daily.   tamsulosin (FLOMAX) 0.4 MG CAPS capsule TAKE 2 CAPSULES BY MOUTH DAILY  AFTER SUPPER   Vitamin D, Ergocalciferol, (DRISDOL) 1.25 MG (50000 UNIT) CAPS capsule TAKE 1 CAPSULE BY MOUTH EVERY  MONDAY   Zinc 50 MG TABS Take 50 mg by mouth at bedtime.   No facility-administered encounter medications on file as of 11/12/2022.     Recent Office Vitals: BP Readings from Last 3 Encounters:  10/31/22 122/82  08/20/22 132/80  07/19/22 116/72   Pulse Readings from Last 3 Encounters:  10/31/22 78  08/20/22 65  07/19/22 72    Wt Readings from Last 3 Encounters:  10/31/22 192 lb 11.2 oz (87.4 kg)  08/20/22 188 lb (85.3 kg)  07/19/22 187 lb (84.8 kg)     Kidney Function Lab Results  Component Value Date/Time   CREATININE 0.75 (L) 07/19/2022 10:29 AM   CREATININE 0.83 01/14/2022 10:22 AM   CREATININE 0.77 07/21/2017 02:01 PM   GFRNONAA >60 01/22/2021 04:15 AM   GFRAA >60 05/15/2018 08:49 AM       Latest Ref Rng & Units 07/19/2022   10:29 AM 01/14/2022   10:22 AM 09/11/2021   10:52 AM  BMP  Glucose 70 - 99 mg/dL 103  92  93   BUN 8 - 27 mg/dL 17  17  13    Creatinine 0.76 - 1.27 mg/dL 0.75  0.83  0.78   BUN/Creat Ratio 10 - 24 23  20  17    Sodium 134 - 144 mmol/L 140  140  137   Potassium 3.5 - 5.2 mmol/L 4.7  4.3  5.0   Chloride 96 - 106 mmol/L 101  103  101   CO2 20 - 29 mmol/L 27  24  27    Calcium 8.6 - 10.2 mg/dL 9.9  9.2  9.4      Elray Mcgregor, [place] Pharmacist Assistant  [phone]

## 2022-11-18 DIAGNOSIS — M4156 Other secondary scoliosis, lumbar region: Secondary | ICD-10-CM | POA: Diagnosis not present

## 2022-11-18 DIAGNOSIS — Z96642 Presence of left artificial hip joint: Secondary | ICD-10-CM | POA: Diagnosis not present

## 2022-11-19 DIAGNOSIS — Z6822 Body mass index (BMI) 22.0-22.9, adult: Secondary | ICD-10-CM | POA: Diagnosis not present

## 2022-11-19 DIAGNOSIS — M5416 Radiculopathy, lumbar region: Secondary | ICD-10-CM | POA: Diagnosis not present

## 2022-11-19 DIAGNOSIS — M48062 Spinal stenosis, lumbar region with neurogenic claudication: Secondary | ICD-10-CM | POA: Diagnosis not present

## 2022-11-19 DIAGNOSIS — M4156 Other secondary scoliosis, lumbar region: Secondary | ICD-10-CM | POA: Diagnosis not present

## 2022-11-20 ENCOUNTER — Other Ambulatory Visit: Payer: Self-pay

## 2022-11-20 ENCOUNTER — Other Ambulatory Visit: Payer: Self-pay | Admitting: Rehabilitation

## 2022-11-20 DIAGNOSIS — M48062 Spinal stenosis, lumbar region with neurogenic claudication: Secondary | ICD-10-CM

## 2022-11-20 DIAGNOSIS — M4716 Other spondylosis with myelopathy, lumbar region: Secondary | ICD-10-CM

## 2022-11-20 MED ORDER — ATORVASTATIN CALCIUM 20 MG PO TABS: 20.0000 mg | ORAL_TABLET | Freq: Every day | ORAL | 1 refills | Status: DC

## 2022-11-23 ENCOUNTER — Ambulatory Visit
Admission: RE | Admit: 2022-11-23 | Discharge: 2022-11-23 | Disposition: A | Discharge status: Home / Self Care | Payer: Medicare Other | Source: Ambulatory Visit | Attending: Rehabilitation | Admitting: Rehabilitation

## 2022-11-23 DIAGNOSIS — M47816 Spondylosis without myelopathy or radiculopathy, lumbar region: Secondary | ICD-10-CM | POA: Diagnosis not present

## 2022-11-23 DIAGNOSIS — M79605 Pain in left leg: Secondary | ICD-10-CM | POA: Diagnosis not present

## 2022-11-23 DIAGNOSIS — M4126 Other idiopathic scoliosis, lumbar region: Secondary | ICD-10-CM | POA: Diagnosis not present

## 2022-11-23 DIAGNOSIS — M4316 Spondylolisthesis, lumbar region: Secondary | ICD-10-CM | POA: Diagnosis not present

## 2022-11-23 DIAGNOSIS — M48061 Spinal stenosis, lumbar region without neurogenic claudication: Secondary | ICD-10-CM | POA: Diagnosis not present

## 2022-11-23 DIAGNOSIS — M545 Low back pain, unspecified: Secondary | ICD-10-CM | POA: Diagnosis not present

## 2022-11-23 DIAGNOSIS — M5136 Other intervertebral disc degeneration, lumbar region: Secondary | ICD-10-CM | POA: Diagnosis not present

## 2022-11-23 DIAGNOSIS — M4716 Other spondylosis with myelopathy, lumbar region: Secondary | ICD-10-CM

## 2022-11-23 DIAGNOSIS — M48062 Spinal stenosis, lumbar region with neurogenic claudication: Secondary | ICD-10-CM

## 2022-11-28 ENCOUNTER — Ambulatory Visit: Payer: Medicare Other | Admitting: Psychiatry

## 2022-11-28 DIAGNOSIS — M48062 Spinal stenosis, lumbar region with neurogenic claudication: Secondary | ICD-10-CM | POA: Diagnosis not present

## 2022-12-15 ENCOUNTER — Other Ambulatory Visit: Payer: Self-pay | Admitting: Family Medicine

## 2022-12-19 DIAGNOSIS — M48062 Spinal stenosis, lumbar region with neurogenic claudication: Secondary | ICD-10-CM | POA: Diagnosis not present

## 2022-12-19 DIAGNOSIS — M5416 Radiculopathy, lumbar region: Secondary | ICD-10-CM | POA: Diagnosis not present

## 2022-12-20 ENCOUNTER — Other Ambulatory Visit: Payer: Self-pay | Admitting: Physician Assistant

## 2022-12-23 ENCOUNTER — Other Ambulatory Visit: Payer: Self-pay | Admitting: Family Medicine

## 2023-01-03 ENCOUNTER — Other Ambulatory Visit: Payer: Self-pay | Admitting: Family Medicine

## 2023-01-07 ENCOUNTER — Other Ambulatory Visit: Payer: Self-pay

## 2023-01-07 NOTE — Telephone Encounter (Signed)
PA submitted and approved via covermymeds for clonazepam.

## 2023-01-08 ENCOUNTER — Other Ambulatory Visit: Payer: Self-pay | Admitting: Neurology
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
## 2023-01-20 ENCOUNTER — Ambulatory Visit: Payer: Medicare Other | Admitting: Psychiatry

## 2023-01-20 DIAGNOSIS — M5416 Radiculopathy, lumbar region: Secondary | ICD-10-CM | POA: Diagnosis not present

## 2023-01-21 ENCOUNTER — Other Ambulatory Visit: Payer: Self-pay | Admitting: Neurology

## 2023-01-21 DIAGNOSIS — G20A1 Parkinson's disease without dyskinesia, without mention of fluctuations: Secondary | ICD-10-CM

## 2023-01-23 NOTE — Progress Notes (Signed)
Subjective:  Patient ID: Travis Reese, male    DOB: 20-Nov-1945  Age: 77 y.o. MRN: [id number]  Chief Complaint  Patient presents with  . Medical Management of Chronic Issues    HPI  Parkinson's disease:  Current Medications: Sinemet IR 25-100mg  take 2 tablets TID, Simemet CR 50-200 1 tablet at bedtime. Fairly stable.   Hyperlipidemia: Taking Atorvastatin 20 mg daily, Krill oil.   BPH: Current medications: Tamsulosin in evening.   Bipolar Depression:  Current Medications: cymbalta 60 mg one tablet twice daily, Scheduled to see psychiatrist in December 2023. Referred by Dr. Arbutus Leas.  Wellbutrin XL 150 mg  daily. Patient states for the past month he is hearing "tunes" in his head. At times it hinders his concentration and makes it difficult to fall asleep.   HYPERTENSION: Coreg 25 mg TWICE DAILY, Spironolactone 25 mg daily  Back pain: Hydrocodone takes as needed Nerve impingement in low back. [facility]. Receiving ESIs. Scheduled to return to see surgeon next month.   B12 deficiency: Taking b12 gummies daily.      01/24/2023    9:52 AM 09/30/2022   12:10 PM 08/20/2022    4:00 PM 08/05/2022    5:39 PM 07/19/2022    9:46 AM  Depression screen PHQ 2/9  Decreased Interest 3  3  3   Down, Depressed, Hopeless 2  2  3   PHQ - 2 Score 5  5  6   Altered sleeping 0  0  0  Tired, decreased energy 3  3  2   Change in appetite 0  0  0  Feeling bad or failure about yourself  3  2  1   Trouble concentrating 0  1  1  Moving slowly or fidgety/restless 0  0  0  Suicidal thoughts 0  0  0  PHQ-9 Score 11  11  10   Difficult doing work/chores Not difficult at all  Somewhat difficult  Somewhat difficult     Information is confidential and restricted. Go to Review Flowsheets to unlock data.        01/24/2023    9:52 AM  Fall Risk   Falls in the past year? 0  Number falls in past yr: 0  Injury with Fall? 0  Risk for fall due to : No Fall Risks  Follow up Falls evaluation  completed    Patient Care Team: Blane Ohara, MD as PCP - General (Internal Medicine) Tat, Octaviano Batty, DO as Consulting Physician (Neurology) Georgeanna Lea, MD as Consulting Physician (Cardiology) Patricia Nettle, MD (Orthopedic Surgery) Zettie Pho, [facility] (Pharmacist)   Review of Systems  Constitutional:  Negative for chills, diaphoresis, fatigue and fever.  HENT:  Positive for congestion. Negative for ear pain and sore throat.   Respiratory:  Positive for cough. Negative for shortness of breath.   Cardiovascular:  Negative for chest pain and leg swelling.  Gastrointestinal:  Negative for abdominal pain, constipation, diarrhea, nausea and vomiting.  Genitourinary:  Negative for dysuria and urgency.  Musculoskeletal:  Positive for back pain. Negative for arthralgias and myalgias.  Neurological:  Negative for dizziness and headaches.  Psychiatric/Behavioral:  Negative for dysphoric mood.     Current Outpatient Medications on File Prior to Visit  Medication Sig Dispense Refill  . HYDROcodone-acetaminophen (NORCO/VICODIN) 5-325 MG tablet Take 1 tablet by mouth every 8 (eight) hours as needed.    Marland Kitchen acetaminophen (TYLENOL) 325 MG tablet Take 2 tablets (650 mg total) by mouth every 6 (six)  hours as needed for mild pain (or Fever >/= 101).    Marland Kitchen albuterol (VENTOLIN HFA) 108 (90 Base) MCG/ACT inhaler Inhale 1-2 puffs into the lungs every 6 (six) hours as needed for wheezing or shortness of breath.    Marland Kitchen aspirin EC 325 MG EC tablet Take 1 tablet (325 mg total) by mouth daily. 30 tablet 0  . atorvastatin (LIPITOR) 20 MG tablet Take 1 tablet (20 mg total) by mouth daily. 100 tablet 1  . buPROPion (WELLBUTRIN XL) 150 MG 24 hr tablet TAKE 1 TABLET BY MOUTH DAILY 90 tablet 3  . carbidopa-levodopa (SINEMET CR) 50-200 MG tablet TAKE 1 TABLET BY MOUTH EVERY  NIGHT AT BEDTIME 90 tablet 0  . carbidopa-levodopa (SINEMET IR) 25-100 MG tablet TAKE 2 TABLETS BY MOUTH 3 TIMES  DAILY AT 9AM, 1PM, AND 5PM  (Patient taking differently: Take 2 tablets by mouth 3 (three) times daily.) 540 tablet 3  . carvedilol (COREG) 25 MG tablet TAKE 1 TABLET BY MOUTH TWICE  DAILY WITH MEALS 180 tablet 2  . celecoxib (CELEBREX) 200 MG capsule TAKE 1 CAPSULE BY MOUTH DAILY 100 capsule 2  . clonazePAM (KLONOPIN) 1 MG tablet TAKE 1 TABLET BY MOUTH AT BEDTIME AND 1 DAILY AS NEEDED FOR ANXIETY 45 tablet 1  . Cyanocobalamin 1000 MCG SUBL Place 1,000 mcg under the tongue daily.    . DULoxetine (CYMBALTA) 60 MG capsule TAKE 1 CAPSULE BY MOUTH TWICE  DAILY 180 capsule 3  . fluticasone (FLONASE) 50 MCG/ACT nasal spray Place 1 spray into both nostrils 3 times/day as needed-between meals & bedtime. (Patient taking differently: Place 1 spray into both nostrils 3 times/day as needed-between meals & bedtime for allergies or rhinitis.) 48 g 3  . KRILL OIL PO Take 1 tablet by mouth daily.    . montelukast (SINGULAIR) 10 MG tablet TAKE 1 TABLET BY MOUTH AT  BEDTIME 100 tablet 2  . rOPINIRole (REQUIP) 1 MG tablet Take 1 tablet (1 mg total) by mouth 3 (three) times daily. 270 tablet 1  . spironolactone (ALDACTONE) 25 MG tablet Take 0.5 tablets (12.5 mg total) by mouth daily. 45 tablet 3  . tamsulosin (FLOMAX) 0.4 MG CAPS capsule TAKE 2 CAPSULES BY MOUTH DAILY  AFTER SUPPER 200 capsule 2  . Vitamin D, Ergocalciferol, (DRISDOL) 1.25 MG (50000 UNIT) CAPS capsule TAKE 1 CAPSULE BY MOUTH EVERY  MONDAY 15 capsule 2  . Zinc 50 MG TABS Take 50 mg by mouth at bedtime.     No current facility-administered medications on file prior to visit.   Past Medical History:  Diagnosis Date  . Allergic rhinosinusitis   . Anxiety   . Aortic valve sclerosis   . Arthritis    knees, neck, shoulder  . CHF (congestive heart failure) (HCC)   . Cubital tunnel syndrome   . DDD (degenerative disc disease), cervical   . Depression   . Elevated PSA    denies per patient  . HTN (hypertension)   . Hyperlipidemia   . Hyperplasia of prostate with lower  urinary tract symptoms (LUTS)   . Hypertension   . Mild dilation of ascending aorta (HCC)   . Parkinson disease   . 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 Rectal prolapse   . Scoliosis of lumbosacral spine   . Stroke (HCC)    x2  . Tremor   . Vitamin D deficiency   . Wears hearing aid    bilateral   Past Surgical History:  Procedure Laterality  Date  . 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 BENTALL PROCEDURE N/A 05/01/2018   Procedure: BIOLOGICAL BENTALL PROCEDURE AORTIC ROOT REPLACEMENT WITH VALSALVA GRAFT & INSPIRIS VALVE.;  Surgeon: Purcell Nails, MD;  Location: [facility] OR;  Service: Open Heart Surgery;  Laterality: N/A;  . BUBBLE STUDY  04/13/2021   Procedure: BUBBLE STUDY;  Surgeon: Jodelle Red, MD;  Location: [facility] ENDOSCOPY;  Service: Cardiovascular;;  . CATARACT EXTRACTION Bilateral 04/2018  . COLONOSCOPY  2007  . NASAL SINUS SURGERY  2006  . RIGHT/LEFT HEART CATH AND CORONARY ANGIOGRAPHY N/A 04/27/2018   Procedure: RIGHT/LEFT HEART CATH AND CORONARY ANGIOGRAPHY;  Surgeon: Dolores Patty, MD;  Location: [facility] INVASIVE CV LAB;  Service: Cardiovascular;  Laterality: N/A;  . TEE WITHOUT CARDIOVERSION N/A 04/27/2018   Procedure: TRANSESOPHAGEAL ECHOCARDIOGRAM (TEE);  Surgeon: Dolores Patty, MD;  Location: [facility] ENDOSCOPY;  Service: Cardiovascular;  Laterality: N/A;  . TEE WITHOUT CARDIOVERSION N/A 05/01/2018   Procedure: TRANSESOPHAGEAL ECHOCARDIOGRAM (TEE);  Surgeon: Purcell Nails, MD;  Location: [facility] OR;  Service: Open Heart Surgery;  Laterality: N/A;  . TEE WITHOUT CARDIOVERSION N/A 04/13/2021   Procedure: TRANSESOPHAGEAL ECHOCARDIOGRAM (TEE);  Surgeon: Jodelle Red, MD;  Location: [facility] ENDOSCOPY;  Service: Cardiovascular;  Laterality: N/A;  . TRANSANAL HEMORRHOIDAL DEARTERIALIZATION N/A 11/15/2015   Procedure: TRANSANAL HEMORRHOIDAL DEARTERIALIZATION;  Surgeon: Romie Levee, MD;  Location: [facility];  Service:  General;  Laterality: N/A;    Family History  Problem Relation Age of Onset  . COPD Mother   . Heart attack Mother   . Colon polyps Father   . Prostate cancer Father   . Parkinson's disease Father   . Hyperlipidemia Father   . Depression Sister   . Alcohol abuse Sister   . Hypertension Sister   . Breast cancer Sister   . Hypertension Brother   . Prostate cancer Paternal Uncle    Social History   Socioeconomic History  . Marital status: Single    Spouse name: Not on file  . Number of children: 2  . Years of education: Not on file  . Highest education level: 12th grade  Occupational History  . Occupation: retired    Comment: Electrical engineer  Tobacco Use  . Smoking status: Every Day    Packs/day: 0.15    Years: 50.00    Additional pack years: 0.00    Total pack years: 7.50    Types: Cigarettes  . Smokeless tobacco: Never  . Tobacco comments:    Patient has cut down to three cigarettes a day  Vaping Use  . Vaping Use: Former  Substance and Sexual Activity  . Alcohol use: Not Currently    Comment: Occasional  . Drug use: No  . Sexual activity: Not Currently  Other Topics Concern  . Not on file  Social History Narrative   1 son, 1 daughter   Volunteer work   3 caffeine/day   Right handed   Social Determinants of Health   Financial Resource Strain: Low Risk  (01/23/2023)   Overall Financial Resource Strain (CARDIA)   . Difficulty of Paying Living Expenses: Not very hard  Food Insecurity: No Food Insecurity (01/23/2023)   Hunger Vital Sign   . Worried About Programme researcher, broadcasting/film/video in the Last Year: Never true   . Ran Out of Food in the Last Year: Never true  Transportation Needs: No Transportation Needs (01/23/2023)   PRAPARE - Transportation   . Lack of Transportation (Medical): No   .  Lack of Transportation (Non-Medical): No  Physical Activity: Inactive (01/23/2023)   Exercise Vital Sign   . Days of Exercise per Week: 0 days   . Minutes of Exercise per Session:  20 min  Stress: Stress Concern Present (01/23/2023)   [employer] of Occupational Health - Occupational Stress Questionnaire   . Feeling of Stress : Rather much  Social Connections: Unknown (01/23/2023)   Social Connection and Isolation Panel [NHANES]   . Frequency of Communication with Friends and Family: More than three times a week   . Frequency of Social Gatherings with Friends and Family: Twice a week   . Attends Religious Services: Patient declined   . Active Member of Clubs or Organizations: Patient declined   . Attends Banker Meetings: Never   . Marital Status: Living with partner    Objective:  BP 120/78   Pulse 67   Temp (!) 97.2 F (36.2 C)   Ht 6\' 2"  (1.88 m)   Wt 192 lb (87.1 kg)   SpO2 99%   BMI 24.65 kg/m      01/24/2023    9:45 AM 10/31/2022   11:41 AM 09/30/2022   11:31 AM  BP/Weight  Systolic BP 120 122   Diastolic BP 78 82   Wt. (Lbs) 192 192.7   BMI 24.65 kg/m2 24.74 kg/m2      Information is confidential and restricted. Go to Review Flowsheets to unlock data.    Physical Exam Vitals reviewed.  Constitutional:      Appearance: Normal appearance. He is normal weight.  Cardiovascular:     Rate and Rhythm: Normal rate and regular rhythm.     Heart sounds: No murmur heard. Pulmonary:     Effort: Pulmonary effort is normal.     Breath sounds: Normal breath sounds.  Abdominal:     General: Abdomen is flat. Bowel sounds are normal.     Palpations: Abdomen is soft.     Tenderness: There is no abdominal tenderness.  Neurological:     Mental Status: He is alert and oriented to person, place, and time.  Psychiatric:        Mood and Affect: Mood normal.        Behavior: Behavior normal.    Diabetic Foot Exam - Simple   No data filed      Lab Results  Component Value Date   WBC 7.3 07/19/2022   HGB 13.4 07/19/2022   HCT 39.1 07/19/2022   PLT 167 07/19/2022   GLUCOSE 103 (H) 07/19/2022   CHOL 112 07/19/2022   TRIG 49  07/19/2022   HDL 61 07/19/2022   LDLCALC 39 07/19/2022   ALT 7 07/19/2022   AST 21 07/19/2022   NA 140 07/19/2022   K 4.7 07/19/2022   CL 101 07/19/2022   CREATININE 0.75 (L) 07/19/2022   BUN 17 07/19/2022   CO2 27 07/19/2022   TSH 1.280 01/14/2022   INR 1.58 05/01/2018      Assessment & Plan:    Hypertensive heart disease with chronic diastolic congestive heart failure (HCC)  Chronic diastolic CHF (congestive heart failure) (HCC)  Mixed hyperlipidemia  B12 deficiency     No orders of the defined types were placed in this encounter.   No orders of the defined types were placed in this encounter.    Follow-up: No follow-ups on file.   Clayborn Bigness I Leal-Borjas,acting as a scribe for Blane Ohara, MD.,have documented all relevant documentation on the behalf of Blane Ohara, MD,as  directed by  Blane Ohara, MD while in the presence of Blane Ohara, MD.   An After Visit Summary was printed and given to the patient.  Blane Ohara, MD [facility] [phone]

## 2023-01-24 ENCOUNTER — Ambulatory Visit (INDEPENDENT_AMBULATORY_CARE_PROVIDER_SITE_OTHER): Payer: Medicare Other | Admitting: Family Medicine

## 2023-01-24 ENCOUNTER — Encounter: Payer: Self-pay | Admitting: Family Medicine

## 2023-01-24 VITALS — BP 120/78 | HR 67 | Temp 97.2°F | Ht 74.0 in | Wt 192.0 lb

## 2023-01-24 DIAGNOSIS — I5032 Chronic diastolic (congestive) heart failure: Secondary | ICD-10-CM

## 2023-01-24 DIAGNOSIS — I700 Atherosclerosis of aorta: Secondary | ICD-10-CM

## 2023-01-24 DIAGNOSIS — E538 Deficiency of other specified B group vitamins: Secondary | ICD-10-CM | POA: Diagnosis not present

## 2023-01-24 DIAGNOSIS — Z953 Presence of xenogenic heart valve: Secondary | ICD-10-CM

## 2023-01-24 DIAGNOSIS — E782 Mixed hyperlipidemia: Secondary | ICD-10-CM

## 2023-01-24 DIAGNOSIS — R350 Frequency of micturition: Secondary | ICD-10-CM

## 2023-01-24 DIAGNOSIS — I110 Hypertensive heart disease with heart failure: Secondary | ICD-10-CM | POA: Diagnosis not present

## 2023-01-24 DIAGNOSIS — G20C Parkinsonism, unspecified: Secondary | ICD-10-CM

## 2023-01-24 DIAGNOSIS — F319 Bipolar disorder, unspecified: Secondary | ICD-10-CM

## 2023-01-24 DIAGNOSIS — N401 Enlarged prostate with lower urinary tract symptoms: Secondary | ICD-10-CM

## 2023-01-25 LAB — CBC WITH DIFFERENTIAL/PLATELET
Basophils Absolute: 0 10*3/uL (ref 0.0–0.2)
Basos: 0 %
EOS (ABSOLUTE): 0.6 10*3/uL — ABNORMAL HIGH (ref 0.0–0.4)
Eos: 8 %
Hematocrit: 40.4 % (ref 37.5–51.0)
Hemoglobin: 13.7 g/dL (ref 13.0–17.7)
Immature Grans (Abs): 0 10*3/uL (ref 0.0–0.1)
Immature Granulocytes: 0 %
Lymphocytes Absolute: 2.1 10*3/uL (ref 0.7–3.1)
Lymphs: 26 %
MCH: 35.5 pg — ABNORMAL HIGH (ref 26.6–33.0)
MCHC: 33.9 g/dL (ref 31.5–35.7)
MCV: 105 fL — ABNORMAL HIGH (ref 79–97)
Monocytes Absolute: 0.6 10*3/uL (ref 0.1–0.9)
Monocytes: 7 %
Neutrophils Absolute: 4.9 10*3/uL (ref 1.4–7.0)
Neutrophils: 59 %
Platelets: 161 10*3/uL (ref 150–450)
RBC: 3.86 x10E6/uL — ABNORMAL LOW (ref 4.14–5.80)
RDW: 12.3 % (ref 11.6–15.4)
WBC: 8.3 10*3/uL (ref 3.4–10.8)

## 2023-01-25 LAB — LIPID PANEL
Chol/HDL Ratio: 2 ratio (ref 0.0–5.0)
Cholesterol, Total: 106 mg/dL (ref 100–199)
HDL: 52 mg/dL (ref >39)
LDL Chol Calc (NIH): 41 mg/dL (ref 0–99)
Triglycerides: 58 mg/dL (ref 0–149)
VLDL Cholesterol Cal: 13 mg/dL (ref 5–40)

## 2023-01-25 LAB — COMPREHENSIVE METABOLIC PANEL
ALT: 5 IU/L (ref 0–44)
AST: 17 IU/L (ref 0–40)
Albumin/Globulin Ratio: 1.7 (ref 1.2–2.2)
Albumin: 4 g/dL (ref 3.8–4.8)
Alkaline Phosphatase: 73 IU/L (ref 44–121)
BUN/Creatinine Ratio: 21 (ref 10–24)
BUN: 18 mg/dL (ref 8–27)
Bilirubin Total: 0.6 mg/dL (ref 0.0–1.2)
CO2: 25 mmol/L (ref 20–29)
Calcium: 9.4 mg/dL (ref 8.6–10.2)
Chloride: 103 mmol/L (ref 96–106)
Creatinine, Ser: 0.87 mg/dL (ref 0.76–1.27)
Globulin, Total: 2.4 g/dL (ref 1.5–4.5)
Glucose: 84 mg/dL (ref 70–99)
Potassium: 5 mmol/L (ref 3.5–5.2)
Sodium: 142 mmol/L (ref 134–144)
Total Protein: 6.4 g/dL (ref 6.0–8.5)
eGFR: 91 mL/min/{1.73_m2} (ref >59)

## 2023-01-25 LAB — CARDIOVASCULAR RISK ASSESSMENT

## 2023-01-25 LAB — VITAMIN B12: Vitamin B-12: 1184 pg/mL (ref 232–1245)

## 2023-01-25 LAB — TSH: TSH: 1.69 u[IU]/mL (ref 0.450–4.500)

## 2023-01-27 DIAGNOSIS — F319 Bipolar disorder, unspecified: Secondary | ICD-10-CM

## 2023-01-27 HISTORY — DX: Bipolar disorder, unspecified: F31.9

## 2023-01-27 NOTE — Assessment & Plan Note (Signed)
Continue cymbalta 60 mg one tablet twice daily,  Wellbutrin XL 150 mg daily. Hearing music. Sent message to her psychiatrist to hopefully adjust medicines.

## 2023-01-27 NOTE — Assessment & Plan Note (Signed)
Check b12 

## 2023-01-27 NOTE — Assessment & Plan Note (Signed)
Management per specialist. 

## 2023-01-27 NOTE — Assessment & Plan Note (Signed)
Continue coreg 25 mg twice daily and spironolactone 25 mg daily.  Management per specialist.

## 2023-01-27 NOTE — Assessment & Plan Note (Signed)
Continue atorvastatin 20 mg before bed.

## 2023-01-27 NOTE — Assessment & Plan Note (Signed)
The current medical regimen is effective;  continue present plan and medications. Continue tamsulosin daily.

## 2023-01-27 NOTE — Assessment & Plan Note (Signed)
Well controlled.  No changes to medicines. Continue atorvastatin 20 mg daily and krill oil. Continue to work on eating a healthy diet and exercise.  Labs drawn today.

## 2023-01-27 NOTE — Assessment & Plan Note (Signed)
Well controlled.  No changes to medicines. Continue Coreg 25 mg TWICE DAILY, Spironolactone 25 mg daily Continue to work on eating a healthy diet and exercise.  Labs drawn today.
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
## 2023-02-04 ENCOUNTER — Encounter: Payer: Self-pay | Admitting: Psychiatry

## 2023-02-04 ENCOUNTER — Ambulatory Visit (INDEPENDENT_AMBULATORY_CARE_PROVIDER_SITE_OTHER): Payer: Medicare Other | Admitting: Psychiatry

## 2023-02-04 VITALS — BP 133/87 | HR 82 | Temp 98.4°F | Ht 74.0 in | Wt 189.0 lb

## 2023-02-04 DIAGNOSIS — F330 Major depressive disorder, recurrent, mild: Secondary | ICD-10-CM

## 2023-02-04 DIAGNOSIS — F419 Anxiety disorder, unspecified: Secondary | ICD-10-CM | POA: Diagnosis not present

## 2023-02-04 MED ORDER — SERTRALINE HCL 50 MG PO TABS: ORAL_TABLET | ORAL | 0 refills | Status: DC

## 2023-02-05 ENCOUNTER — Other Ambulatory Visit: Payer: Self-pay | Admitting: Neurology

## 2023-02-05 DIAGNOSIS — G20A1 Parkinson's disease without dyskinesia, without mention of fluctuations: Secondary | ICD-10-CM

## 2023-02-10 ENCOUNTER — Other Ambulatory Visit: Payer: Self-pay | Admitting: Neurology

## 2023-02-10 ENCOUNTER — Telehealth: Payer: Self-pay

## 2023-02-10 DIAGNOSIS — G20A1 Parkinson's disease without dyskinesia, without mention of fluctuations: Secondary | ICD-10-CM

## 2023-02-10 NOTE — Telephone Encounter (Signed)
pt wante to go over the medication instructions that was changed. pt was given info according to last note from 6-4.      Plan Decrease duloxetine 60 mg daily for one week, then discontinue  Start sertraline 25 mg at night for 1 week , then 50 mg at night for 1 week , then 100 mg at night  Hold Vraylar (he discontinued a few months ago) Continue bupropion 150 mg daily  Next appointment: 8/6 at 11:30 for 30 mins, IP He agrees that this Clinical research associate communicates with his neurologist, Dr. Arbutus Leas regarding the care. - on clonazepam 0.5 mg at night, hydrocodone, prescribed by PCP

## 2023-02-11 ENCOUNTER — Other Ambulatory Visit: Payer: Self-pay

## 2023-02-11 MED ORDER — BUPROPION HCL ER (XL) 150 MG PO TB24: 150.0000 mg | ORAL_TABLET | Freq: Every day | ORAL | 0 refills | Status: DC

## 2023-02-19 DIAGNOSIS — M48062 Spinal stenosis, lumbar region with neurogenic claudication: Secondary | ICD-10-CM | POA: Diagnosis not present

## 2023-02-19 DIAGNOSIS — M5416 Radiculopathy, lumbar region: Secondary | ICD-10-CM | POA: Diagnosis not present

## 2023-03-03 NOTE — Patient Instructions (Incomplete)
Decrease requip to 1mg , 1/2 tablet three times per day x 2 weeks, then 1/2 tab twice per day x 1 week, then 1/2 tablet once per day x 1 week and then STOP requip

## 2023-03-03 NOTE — Progress Notes (Signed)
Assessment/Plan:   1.  Parkinsons Disease, worsened by Abilify  -Patient off Abilify since April, 2022  -pt improved when he went off of Abilify.  -Patient off vraylar  -continue carbidopa/levodopa 25/100, 2 tablet 3 times daily, 9am/1pm/5pm  -continue carbidopa/levodopa 50/200 cr at bed to see if helps with first AM on  -I was going to d/c the requip but he is not having true hallucinations.  He's hearing some music but not voices and nothing visual.  He looks much better on the requip from a Parkinsons Disease standpoint so I am leery about d/c it (which was the original plan today).  Ultimately, I just made a slight decrease.  Decrease requip to 1mg , 1 tablet at 9am, 1 at 1 pm and 1/2 tablet at 5pm.  We will monitor closely  -Would like to see reduction in clonazepam.  Patient currently on clonazepam, 1 mg and goes through 45 tablets/month per PDMP.  I would like to see complete discontinuation of the daytime dosage, due to risk for falls and confusion.  Discussed again.  Last picked up 6/10.  -Continue Inbrija.  He has samples at home as insurance has denied it 2.  History of cerebral infarction, with moderate sized PFO  -Patient is on aspirin  -Patient is on statin.  3.  Essential tremor  --the tremor he describes (eating) is likely from this  -he used to be on primidone but wanted to stop that.  4.  Depression   -Now following with Dr. Vanetta Shawl.  5.  Lightheadedness with significant Neurogenic Orthostatic Hypotension in the office today  -likely due to Parkinsons Disease in combination with multiple BP meds - spironolactone and carvidelol.  One is RX by cardiology and one by PCP.  Discussed concept of permissive HTN in Parkinsons Disease but may need meds for other reasons.  Will reach out to prescribing physicians as his BP went down to 60/40 with standing today (and pt was fairly asympommatic with this).   Subjective:   Travis Reese was seen today in follow up for  Parkinsons disease.  My previous records were reviewed prior to todays visit as well as outside records available to me.  Last visit, I worked pt in today.  I spoke with Dr. Vanetta Shawl about his case.  She took him off of vraylar at our request but patient began to have hallucinations.  At first, we thought it was due to going off of that but she astutely noted that I had just put him on requip.  He is here to discuss that.  He has been started on sertraline.  Pt states "I am not having hallucinations but I hear music.  I don't hear voices or people."  Relates trouble walking to back pain radiating to the L leg and "the left side of my back and leg hurts."  He is seeing [facility] and receiving injections. Having some lightheadedness.  Also some GERD/nausea sx's.    Current prescribed movement disorder medications: carbidopa/levodopa 25/100, 2 tablets at 9am/1pm/5pm  carbidopa/levodopa 50/200 CR q hs  Inbrija (started last visit).  PREVIOUS MEDICATIONS: Sinemet; primidone (for ET - and did okay stopping that); abilify; vraylar; requip (hearing music but not voices or seeing things - also had stopped vraylar at same time)  ALLERGIES:  No Known Allergies  CURRENT MEDICATIONS:  Outpatient Encounter Medications as of 03/04/2023  Medication Sig   acetaminophen (TYLENOL) 325 MG tablet Take 2 tablets (650 mg total) by mouth every  6 (six) hours as needed for mild pain (or Fever >/= 101).   albuterol (VENTOLIN HFA) 108 (90 Base) MCG/ACT inhaler Inhale 1-2 puffs into the lungs every 6 (six) hours as needed for wheezing or shortness of breath.   aspirin EC 325 MG EC tablet Take 1 tablet (325 mg total) by mouth daily.   atorvastatin (LIPITOR) 20 MG tablet Take 1 tablet (20 mg total) by mouth daily.   buPROPion (WELLBUTRIN XL) 150 MG 24 hr tablet Take 1 tablet (150 mg total) by mouth daily.   carbidopa-levodopa (SINEMET CR) 50-200 MG tablet TAKE 1 TABLET BY MOUTH EVERY  NIGHT AT BEDTIME    carbidopa-levodopa (SINEMET IR) 25-100 MG tablet TAKE 2 TABLETS BY MOUTH 3 TIMES  DAILY AT 9 AM, 1 PM, AND 5 PM   carvedilol (COREG) 25 MG tablet TAKE 1 TABLET BY MOUTH TWICE  DAILY WITH MEALS   celecoxib (CELEBREX) 200 MG capsule TAKE 1 CAPSULE BY MOUTH DAILY   clonazePAM (KLONOPIN) 1 MG tablet TAKE 1 TABLET BY MOUTH AT BEDTIME AND 1 DAILY AS NEEDED FOR ANXIETY   Cyanocobalamin 1000 MCG SUBL Place 1,000 mcg under the tongue daily.   DULoxetine (CYMBALTA) 60 MG capsule TAKE 1 CAPSULE BY MOUTH TWICE  DAILY   fluticasone (FLONASE) 50 MCG/ACT nasal spray Place 1 spray into both nostrils 3 times/day as needed-between meals & bedtime. (Patient taking differently: Place 1 spray into both nostrils 3 times/day as needed-between meals & bedtime for allergies or rhinitis.)   HYDROcodone-acetaminophen (NORCO/VICODIN) 5-325 MG tablet Take 1 tablet by mouth every 8 (eight) hours as needed.   KRILL OIL PO Take 1 tablet by mouth daily.   montelukast (SINGULAIR) 10 MG tablet TAKE 1 TABLET BY MOUTH AT  BEDTIME   sertraline (ZOLOFT) 50 MG tablet 25 mg at night for one week, then 50 mg at night for one week, then 100 mg at night   spironolactone (ALDACTONE) 25 MG tablet Take 0.5 tablets (12.5 mg total) by mouth daily.   tamsulosin (FLOMAX) 0.4 MG CAPS capsule TAKE 2 CAPSULES BY MOUTH DAILY  AFTER SUPPER   Vitamin D, Ergocalciferol, (DRISDOL) 1.25 MG (50000 UNIT) CAPS capsule TAKE 1 CAPSULE BY MOUTH EVERY  MONDAY   Zinc 50 MG TABS Take 50 mg by mouth at bedtime.   [DISCONTINUED] rOPINIRole (REQUIP) 1 MG tablet TAKE 1 TABLET BY MOUTH 3 TIMES  DAILY   rOPINIRole (REQUIP) 1 MG tablet 1 at 9am, 1 at 1pm, 1/2 tablet at 5pm   No facility-administered encounter medications on file as of 03/04/2023.    Objective:   PHYSICAL EXAMINATION:    VITALS:   Vitals:   03/04/23 1409 03/04/23 1456 03/04/23 1457 03/04/23 1458  BP: 112/72     Pulse: 73     SpO2: 96% 94% 92% 92%  Weight: 186 lb 11.2 oz (84.7 kg)     Height: 6'  2" (1.88 m)      Orthostatic VS for the past 72 hrs (Last 3 readings):  Orthostatic BP Patient Position BP Location Orthostatic Pulse  03/04/23 1458 (!) 60/40 Standing Left Arm 60  03/04/23 1457 90/60 Sitting Left Arm 60  03/04/23 1456 112/59 Supine Left Arm 61    Orthostatic VS for the past 72 hrs (Last 3 readings):  Orthostatic BP Patient Position BP Location Orthostatic Pulse  03/04/23 1458 (!) 60/40 Standing Left Arm 60  03/04/23 1457 90/60 Sitting Left Arm 60  03/04/23 1456 112/59 Supine Left Arm 61  GEN:  The patient appears stated age and is in NAD. HEENT:  Normocephalic, atraumatic.  The mucous membranes are moist. The superficial temporal arteries are without ropiness or tenderness. CV:  RRR Lungs:  CTAB Neck/HEME:  There are no carotid bruits bilaterally.  Neurological examination:  Orientation: The patient is alert and oriented x3. Cranial nerves: There is good facial symmetry with facial hypomimia. The speech is fluent and clear. Soft palate rises symmetrically and there is no tongue deviation. Hearing is intact to conversational tone. Sensation: Sensation is intact to light touch throughout Motor: Strength is at least antigravity x4.  Movement examination: Tone: There is nl tone in the UE/LE Abnormal movements: no rest tremor Coordination:  There is mild decremation with RAM's, with any form of RAMS, including alternating supination and pronation of the forearm, hand opening and closing, finger taps, heel taps and toe taps on the bilaterally Gait and Station: The patient has  difficulty arising out of a deep-seated chair without the use of the hands. The patient's stride length is decreased and he is dragging the R leg.    I have reviewed and interpreted the following labs independently    Chemistry      Component Value Date/Time   NA 142 01/24/2023 1030   K 5.0 01/24/2023 1030   CL 103 01/24/2023 1030   CO2 25 01/24/2023 1030   BUN 18 01/24/2023 1030    CREATININE 0.87 01/24/2023 1030   CREATININE 0.77 07/21/2017 1401      Component Value Date/Time   CALCIUM 9.4 01/24/2023 1030   ALKPHOS 73 01/24/2023 1030   AST 17 01/24/2023 1030   ALT 5 01/24/2023 1030   BILITOT 0.6 01/24/2023 1030       Lab Results  Component Value Date   WBC 8.3 01/24/2023   HGB 13.7 01/24/2023   HCT 40.4 01/24/2023   MCV 105 (H) 01/24/2023   PLT 161 01/24/2023    Lab Results  Component Value Date   TSH 1.690 01/24/2023     Total time spent on today's visit was 33 minutes, including both face-to-face time and nonface-to-face time.  Time included that spent on review of records (prior notes available to me/labs/imaging if pertinent), discussing treatment and goals, answering patient's questions and coordinating care.  Cc:  Blane Ohara, MD

## 2023-03-04 ENCOUNTER — Ambulatory Visit: Payer: Medicare Other | Admitting: Neurology

## 2023-03-04 ENCOUNTER — Encounter: Payer: Self-pay | Admitting: Neurology

## 2023-03-04 VITALS — BP 112/72 | HR 73 | Ht 74.0 in | Wt 186.7 lb

## 2023-03-04 DIAGNOSIS — G20A1 Parkinson's disease without dyskinesia, without mention of fluctuations: Secondary | ICD-10-CM | POA: Diagnosis not present

## 2023-03-04 DIAGNOSIS — G20A2 Parkinson's disease without dyskinesia, with fluctuations: Secondary | ICD-10-CM

## 2023-03-04 DIAGNOSIS — G903 Multi-system degeneration of the autonomic nervous system: Secondary | ICD-10-CM | POA: Diagnosis not present

## 2023-03-04 MED ORDER — ROPINIROLE HCL 1 MG PO TABS: ORAL_TABLET | ORAL | 0 refills | Status: DC

## 2023-03-05 ENCOUNTER — Other Ambulatory Visit: Payer: Self-pay

## 2023-03-05 MED ORDER — CARVEDILOL 25 MG PO TABS: 12.5000 mg | ORAL_TABLET | Freq: Two times a day (BID) | ORAL | 2 refills | Status: DC

## 2023-03-07 ENCOUNTER — Other Ambulatory Visit: Payer: Self-pay | Admitting: Psychiatry

## 2023-03-07 NOTE — Progress Notes (Signed)
Called pateint and left message  

## 2023-03-12 DIAGNOSIS — M47816 Spondylosis without myelopathy or radiculopathy, lumbar region: Secondary | ICD-10-CM | POA: Diagnosis not present

## 2023-03-17 ENCOUNTER — Other Ambulatory Visit: Payer: Self-pay | Admitting: Family Medicine

## 2023-03-17 ENCOUNTER — Encounter: Payer: Self-pay | Admitting: Cardiology

## 2023-03-17 NOTE — Progress Notes (Unsigned)
Cardiology Office Note:  .   Date:  03/18/2023  ID:  Travis Reese, DOB 09/09/1945, MRN [id number] PCP: Travis Ohara, MD  [facility] Providers Cardiologist:  None    History of Present Illness: .   Travis Reese is a 77 y.o. 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  06/14/2022 echocardiogram EF 55 to 60%, grade 1 DD, no AVR 05/08/2021 monitor revealed episodes of ventricular tachycardia at 4 bpm, no episodes of SVT or atrial fibrillation 01/22/2021 carotid ultrasound mild bilateral carotid artery disease 04/28/2018 right left heart cath revealed mild nonobstructive CAD with 40% calcified M LAD lesion, severe AI  Most recently evaluated by Dr. Bing Reese on 06/04/2022, he was doing well from a cardiac perspective.  He had previously been hospitalized with multiple CVAs and found to have a PFO, he wore a long-term monitor which revealed no evidence of atrial fibrillation.  He had recently been diagnosed with Parkinson's.  An echocardiogram was arranged and completed on 06/14/2022 revealing an EF of 55 to 60%, grade 1 DD, no AVR was visualized.  He presents today for follow-up of his CAD and diastolic heart failure.  He has been doing well from a cardiac perspective and offers no complaints.  He has been most bothered recently by a pinched nerve in his back, receiving injections for this.  He is not able to be very physically active secondary to back pain.  He does endorse dizziness at times, he feels it might be related to his blood pressure dropping low, his neurologist felt this might be the case as well.  He is on Coreg, some confusion on the actual dose he is currently taking. He denies chest pain, palpitations,  dyspnea, pnd, orthopnea, n, v, dizziness, syncope, edema, weight gain, or early satiety.   ROS: Review of Systems  Constitutional:  Positive for malaise/fatigue.  HENT: Negative.    Eyes: Negative.   Respiratory: Negative.    Cardiovascular: Negative.   Gastrointestinal: Negative.   Genitourinary: Negative.   Musculoskeletal:  Positive for back pain.  Skin: Negative.   Neurological:  Positive for dizziness.  Endo/Heme/Allergies:  Bruises/bleeds easily.  Psychiatric/Behavioral: Negative.       Studies Reviewed: .        Cardiac Studies & Procedures   CARDIAC CATHETERIZATION  CARDIAC CATHETERIZATION 04/27/2018  Narrative  Prox LAD to Mid LAD lesion is 40% stenosed.   Findings:  Ao = 104/49 (74) LV = 94/24 RA =  3 RV = 37/6 PA = 38/14 (25) PCW = 17 Fick cardiac output/index = 5.5/2.6 PVR = 1.5 WU Ao sat = 99% PA sat = 69%, 73%  Assessment: 1. Mild nonobstructive CAD with 40% calcified mLAD lesion. 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 Normal LVEF 3. 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 Essentially normal hemodynamics  Plan/Discussion:  Proceed with AVR later this week.  Travis Meres, MD 4:03 PM  Findings Coronary Findings Diagnostic  Dominance: Right  Left Main Vessel is angiographically normal.  Left Anterior Descending Prox LAD to Mid LAD lesion is 40% stenosed. The lesion is calcified.  Left Circumflex Vessel is angiographically normal.  Right Coronary Artery Vessel is normal in caliber. 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  Intervention  No interventions have been documented.     ECHOCARDIOGRAM  ECHOCARDIOGRAM COMPLETE 06/14/2022  Narrative ECHOCARDIOGRAM REPORT    Patient Name:   Travis Reese Date of Exam: 06/14/2022 Medical Rec #:  [id number]       Height:       74.0 in Accession #:    [id number]      Weight:       185.0 lb Date of Birth:  [birth year]      BSA:          2.103 m Patient Age:     75 years        BP:           115/73 mmHg Patient Gender: M               HR:           59 bpm. Exam Location:  [place]  Procedure: 2D Echo, Cardiac Doppler, Color Doppler and Strain Analysis  Indications:    S/P aortic valve replacement with bioprosthetic valve [Z95.3 (ICD-10-CM)]  History:        Patient has prior history of Echocardiogram examinations, most recent 01/21/2021. CHF, Aortic Valve Disease; Risk Factors:Dyslipidemia. BIOLOGICAL BENTALL PROCEDURE AORTIC ROOT REPLACEMENT WITH VALSALVA GRAFT & INSPIRIS VALVE. Aortic Valve: 27 mm InspirEase valve is present in the aortic position. Procedure Date: 05/01/2018 27 mm InspirEase valve.  Sonographer:    Travis Reese RDCS Referring Phys: [id number] Travis Reese  IMPRESSIONS   1. Vertical orientation of LV. Left ventricular ejection fraction, by estimation, is 55 to 60%. The left ventricle has normal function. The left ventricle has no regional wall motion abnormalities. Left ventricular diastolic parameters are consistent with Grade I diastolic dysfunction (impaired relaxation). The average left ventricular global longitudinal strain is -15.4 %. The global longitudinal strain is abnormal. 2. Right ventricular systolic function is normal. The right ventricular size is normal. 3. The mitral valve is normal in structure. No evidence of mitral valve regurgitation. No evidence of mitral stenosis. 4. The aortic valve has been repaired/replaced. Aortic valve regurgitation is not visualized. There is a 27 mm InspirEase valve present in the aortic position. Procedure Date: 05/01/2018 27 mm InspirEase valve. Echo findings are consistent with normal structure and function of the aortic valve prosthesis. 5. Aortic Normal DTA and root/ascending aorta has been repaired/replaced. 6. The inferior vena cava is normal in size with greater than 50% respiratory variability, suggesting right atrial pressure of 3 mmHg.  FINDINGS Left  Ventricle: Vertical orientation of LV. Left ventricular ejection fraction, by estimation, is 55 to 60%. The left ventricle has normal function. The left ventricle has no regional wall motion abnormalities. The average left ventricular global longitudinal strain is -15.4 %. The global longitudinal strain is abnormal. The left ventricular internal cavity size was normal in size. There is no left ventricular hypertrophy. Left ventricular diastolic parameters are consistent with Grade I diastolic dysfunction (impaired relaxation). Normal left ventricular filling pressure.  Right Ventricle: The right ventricular size is normal. No increase in right ventricular wall thickness. Right ventricular systolic function is normal.  Left Atrium: Left atrial size was normal in size.  Right Atrium: Right atrial size was normal in size.  Pericardium: There is no evidence of pericardial effusion.  Mitral Valve: The mitral valve is normal in structure. No evidence of mitral valve regurgitation. No evidence of mitral valve stenosis.  Tricuspid Valve: The tricuspid valve is normal in structure. Tricuspid valve regurgitation is not demonstrated. No  evidence of tricuspid stenosis.  Aortic Valve: The aortic valve has been repaired/replaced. Aortic valve regurgitation is not visualized. Aortic valve mean gradient measures 7.0 mmHg. Aortic valve peak gradient measures 12.5 mmHg. There is a 27 mm InspirEase valve present in the aortic position. Procedure Date: 05/01/2018 27 mm InspirEase valve. Echo findings are consistent with normal structure and function of the aortic valve prosthesis.  Pulmonic Valve: The pulmonic valve was normal in structure. Pulmonic valve regurgitation is not visualized. No evidence of pulmonic stenosis.  Aorta: Normal DTA, the aortic root/ascending aorta has been repaired/replaced and the aortic root and ascending aorta are structurally normal, with no evidence of dilitation.  Venous: A normal  flow pattern is recorded from the right upper pulmonary vein. The inferior vena cava is normal in size with greater than 50% respiratory variability, suggesting right atrial pressure of 3 mmHg.  IAS/Shunts: No atrial level shunt detected by color flow Doppler.   LEFT VENTRICLE PLAX 2D LVIDd:         5.20 cm Diastology LVIDs:         3.90 cm LV e' medial:    7.07 cm/s LV PW:         1.00 cm LV E/e' medial:  9.6 LV IVS:        1.00 cm LV e' lateral:   9.03 cm/s LV E/e' lateral: 7.5  2D Longitudinal Strain 2D Strain GLS Avg:     -15.4 %  RIGHT VENTRICLE            IVC RV Basal diam:  3.45 cm    IVC diam: 2.60 cm RV Mid diam:    2.00 cm RV S prime:     9.25 cm/s TAPSE (M-mode): 2.7 cm  LEFT ATRIUM             Index        RIGHT ATRIUM           Index LA diam:        1.90 cm 0.90 cm/m   RA Area:     16.20 cm LA Vol (A2C):   37.5 ml 17.83 ml/m  RA Volume:   52.60 ml  25.02 ml/m LA Vol (A4C):   46.2 ml 21.97 ml/m LA Biplane Vol: 41.6 ml 19.78 ml/m AORTIC VALVE AV Vmax:           177.00 cm/s AV Vmean:          122.000 cm/s AV VTI:            0.379 m AV Peak Grad:      12.5 mmHg AV Mean Grad:      7.0 mmHg LVOT Vmax:         150.00 cm/s LVOT Vmean:        92.100 cm/s LVOT VTI:          0.299 m LVOT/AV VTI ratio: 0.79  AORTA Ao Root diam: 2.90 cm Ao Asc diam:  3.70 cm Ao Desc diam: 2.60 cm  MITRAL VALVE MV Area (PHT): 2.24 cm    SHUNTS MV Decel Time: 338 msec    Systemic VTI: 0.30 m MV E velocity: 67.70 cm/s MV A velocity: 84.80 cm/s MV E/A ratio:  0.80  Norman Herrlich MD Electronically signed by Norman Herrlich MD Signature Date/Time: 06/14/2022/2:30:22 PM    Final   TEE  ECHO TEE 04/13/2021  Narrative TRANSESOPHOGEAL ECHO REPORT    Patient Name:   Travis Reese Date of Exam: 04/13/2021 Medical Rec #:  [id number]       Height:       74.0 in Accession #:    [id number]      Weight:       187.0 lb Date of Birth:  [birth year]      BSA:          2.112  m Patient Age:    74 years        BP:           161/93 mmHg Patient Gender: M               HR:           58 bpm. Exam Location:  Inpatient  Procedure: 3D Echo, Transesophageal Echo, Cardiac Doppler, Color Doppler and Saline Contrast Bubble Study  Indications:     Rule out PFO  History:         Patient has prior history of Echocardiogram examinations, most recent 04/08/2019. Hx stroke. Aortic Valve: 27 mm Edwards bovine valve is present in the aortic position. Procedure Date: 05/01/2017.  Sonographer:     Ross Ludwig RDCS (AE) Referring Phys:  [id number] BRIDGETTE CHRISTOPHER Diagnosing Phys: Jodelle Red MD  PROCEDURE: After discussion of the risks and benefits of a TEE, an informed consent was obtained from the patient. The transesophogeal probe was passed without difficulty through the esophogus of the patient. Sedation performed by different physician. The patient was monitored while under deep sedation. Anesthestetic sedation was provided intravenously by Anesthesiology: 231.4mg  of Propofol. Image quality was good. The patient's vital signs; including heart rate, blood pressure, and oxygen saturation; remained stable throughout the procedure. The patient developed no complications during the procedure.  IMPRESSIONS   1. Left ventricular ejection fraction, by estimation, is 55 to 60%. The left ventricle has normal function. The left ventricle has no regional wall motion abnormalities. 2. Right ventricular systolic function is normal. The right ventricular size is normal. 3. No left atrial/left atrial appendage thrombus was detected. The LAA emptying velocity was 70 cm/s. 4. The mitral valve is normal in structure. Trivial mitral valve regurgitation. No evidence of mitral stenosis. 5. The aortic valve has been repaired/replaced. Aortic valve regurgitation is trivial. No aortic stenosis is present. There is a 27 mm Edwards bovine valve present in the aortic position. Procedure  Date: 05/01/2017. 6. Aortic root/ascending aorta has been repaired/replaced. There is Moderate (Grade III) plaque involving the descending aorta. 7. Evidence of atrial level shunting detected by color flow Doppler. Agitated saline contrast bubble study was positive with shunting observed within 3-6 cardiac cycles suggestive of interatrial shunt. There is a moderately sized patent foramen ovale with predominantly right to left shunting across the atrial septum.  Conclusion(s)/Recommendation(s): Study positive for patient foramen ovale, with right to left shunting by color doppler and agitated saline.  FINDINGS Left Ventricle: Left ventricular ejection fraction, by estimation, is 55 to 60%. The left ventricle has normal function. The left ventricle has no regional wall motion abnormalities. The left ventricular internal cavity size was normal in size.  Right Ventricle: The right ventricular size is normal. No increase in right ventricular wall thickness. Right ventricular systolic function is normal.  Left Atrium: Left atrial size was normal in size. No left atrial/left atrial appendage thrombus was detected. The LAA emptying velocity was 70 cm/s.  Right Atrium: Right atrial size was normal in size.  Pericardium: There is no evidence of pericardial effusion.  Mitral Valve: The mitral valve is normal in structure. Trivial mitral valve regurgitation.  No evidence of mitral valve stenosis. There is no evidence of mitral valve vegetation.  Tricuspid Valve: The tricuspid valve is normal in structure. Tricuspid valve regurgitation is trivial. No evidence of tricuspid stenosis. There is no evidence of tricuspid valve vegetation.  Aortic Valve: Overall normal functioning prosthesis with only trivial central regurgitation. The aortic valve has been repaired/replaced. Aortic valve regurgitation is trivial. No aortic stenosis is present. Aortic valve mean gradient measures 7.0 mmHg. Aortic valve peak  gradient measures 14.1 mmHg. There is a 27 mm Edwards bovine valve present in the aortic position. Procedure Date: 05/01/2017. There is no evidence of aortic valve vegetation.  Pulmonic Valve: The pulmonic valve was grossly normal. Pulmonic valve regurgitation is not visualized. No evidence of pulmonic stenosis. There is no evidence of pulmonic valve vegetation.  Aorta: The aortic root/ascending aorta has been repaired/replaced. There is moderate (Grade III) plaque involving the descending aorta.  IAS/Shunts: The interatrial septum is aneurysmal. Evidence of atrial level shunting detected by color flow Doppler. Agitated saline contrast was given intravenously to evaluate for intracardiac shunting. Agitated saline contrast bubble study was positive with shunting observed within 3-6 cardiac cycles suggestive of interatrial shunt. A moderately sized patent foramen ovale is detected with predominantly right to left shunting across the atrial septum.   AORTIC VALVE AV Vmax:      188.00 cm/s AV Vmean:     123.000 cm/s AV VTI:       0.435 m AV Peak Grad: 14.1 mmHg AV Mean Grad: 7.0 mmHg  Jodelle Red MD Electronically signed by Jodelle Red MD Signature Date/Time: 04/13/2021/2:09:09 PM    Final   MONITORS  LONG TERM MONITOR (3-14 DAYS) 05/01/2021  Narrative Patch Wear Time:  14 days and 0 hours (2022-08-08T11:33:39-0400 to 2022-08-22T11:33:43-0400)  Patient had a min HR of 44 bpm, max HR of 160 bpm, and avg HR of 65 bpm. Predominant underlying rhythm was Sinus Rhythm. 1 run of Ventricular Tachycardia occurred lasting 4 beats with a max rate of 125 bpm (avg 117 bpm). 7 Supraventricular Tachycardia runs occurred, the run with the fastest interval lasting 17.0 secs with a max rate of 160 bpm (avg 114 bpm); the run with the fastest interval was also the longest. Some episodes of Supraventricular Tachycardia may be possible Atrial Tachycardia with variable block. Isolated SVEs  were rare (<1.0%), SVE Couplets were rare (<1.0%), and SVE Triplets were rare (<1.0%). Isolated VEs were rare (<1.0%, 1196), VE Couplets were rare (<1.0%, 7), and VE Triplets were rare (<1.0%, 2). Ventricular Bigeminy and Trigeminy were present.  Summary and conclusions:  1 run of ventricular tachycardia only 4 beats at rate of 125 bpm 7 episode of supraventricular tachycardia No symptoms entered and treated by her           Risk Assessment/Calculations:             Physical Exam:   VS:  BP 120/82 (BP Location: Right Arm, Patient Position: Sitting, Cuff Size: Normal)   Pulse 68   Ht 6\' 2"  (1.88 m)   Wt 186 lb 3.2 oz (84.5 kg)   SpO2 94%   BMI 23.91 kg/m    Wt Readings from Last 3 Encounters:  03/18/23 186 lb 3.2 oz (84.5 kg)  03/04/23 186 lb 11.2 oz (84.7 kg)  01/24/23 192 lb (87.1 kg)    GEN: Well nourished, well developed in no acute distress NECK: No JVD; No carotid bruits CARDIAC: RRR, no murmurs, rubs, gallops RESPIRATORY:  Clear to auscultation without rales,  wheezing or rhonchi  ABDOMEN: Soft, non-tender, non-distended EXTREMITIES:  No edema; No deformity   ASSESSMENT AND PLAN: .   Coronary artery disease-nonobstructive per [facility] in 2019, Stable with no anginal symptoms. No indication for ischemic evaluation.  Continue aspirin daily, continue Crestor 20 mg daily, continue Coreg--unclear what dose he is currently taking. Aortic valve replacement/Bentall procedure-s/p 2019, most recent echo revealed no AVR.  Continue SBE prophylaxis.  Blood pressure is well-controlled. HFpEF-most recent echo in October 2023 revealed EF 55 to 60%, NYHA class I, euvolemic, continue carvedilol. Hypotension -he complains of intermittent episodes of dizziness that he cannot correlate with anything, neurologist felt there might be a component of orthostasis.  He is unsure of his Coreg dose, he will call us when he gets home and notify us but plan to decrease his Coreg dose. Hyperlipidemia-most  recent LDL was well-controlled at 41 a month ago, continue atorvastatin 20 mg daily Parkinsons disease - stable, follows with Neurology (Dr. Arbutus Leas)       Dispo: Call us and let us know dose of Coreg, return in 6 months.   Signed, Flossie Dibble, NP

## 2023-03-18 ENCOUNTER — Telehealth: Payer: Self-pay | Admitting: *Deleted

## 2023-03-18 ENCOUNTER — Ambulatory Visit: Payer: Medicare Other | Attending: Cardiology | Admitting: Cardiology

## 2023-03-18 ENCOUNTER — Encounter: Payer: Self-pay | Admitting: Cardiology

## 2023-03-18 VITALS — BP 120/82 | HR 68 | Ht 74.0 in | Wt 186.2 lb

## 2023-03-18 DIAGNOSIS — I5030 Unspecified diastolic (congestive) heart failure: Secondary | ICD-10-CM

## 2023-03-18 DIAGNOSIS — Z953 Presence of xenogenic heart valve: Secondary | ICD-10-CM

## 2023-03-18 DIAGNOSIS — Z8673 Personal history of transient ischemic attack (TIA), and cerebral infarction without residual deficits: Secondary | ICD-10-CM

## 2023-03-18 DIAGNOSIS — Q2112 Patent foramen ovale: Secondary | ICD-10-CM | POA: Diagnosis not present

## 2023-03-18 DIAGNOSIS — I2510 Atherosclerotic heart disease of native coronary artery without angina pectoris: Secondary | ICD-10-CM

## 2023-03-18 NOTE — Patient Instructions (Signed)
Medication Instructions:  Your physician recommends that you continue on your current medications as directed. Please refer to the Current Medication list given to you today. Call our office to confirm dosage of your Coreg. Sherian Maroon [phone] *If you need a refill on your cardiac medications before your next appointment, please call your pharmacy*   Lab Work: NONE If you have labs (blood work) drawn today and your tests are completely normal, you will receive your results only by: MyChart Message (if you have MyChart) OR A paper copy in the mail If you have any lab test that is abnormal or we need to change your treatment, we will call you to review the results.   Testing/Procedures: NONE   Follow-Up: At [facility], you and your health needs are our priority.  As part of our continuing mission to provide you with exceptional heart care, we have created designated Provider Care Teams.  These Care Teams include your primary Cardiologist (physician) and Advanced Practice Providers (APPs -  Physician Assistants and Nurse Practitioners) who all work together to provide you with the care you need, when you need it.  We recommend signing up for the patient portal called "MyChart".  Sign up information is provided on this After Visit Summary.  MyChart is used to connect with patients for Virtual Visits (Telemedicine).  Patients are able to view lab/test results, encounter notes, upcoming appointments, etc.  Non-urgent messages can be sent to your provider as well.   To learn more about what you can do with MyChart, go to [email or web address].    Your next appointment:   6 month(s)  Provider:   Gypsy Balsam, MD    Other Instructions

## 2023-03-18 NOTE — Telephone Encounter (Signed)
Pt called to confirm his dosage of Carvedilol. He is taking 25 mg two times daily. Addressed with Wallis Bamberg, NP and she wants to change dosage to 12.5 mg two times daily. Pt verbalized understanding and had no further questions. Did not need refills at this time

## 2023-03-20 ENCOUNTER — Ambulatory Visit (INDEPENDENT_AMBULATORY_CARE_PROVIDER_SITE_OTHER): Payer: Medicare Other | Admitting: Family Medicine

## 2023-03-20 VITALS — BP 118/80 | HR 68 | Temp 98.6°F | Resp 12 | Ht 74.0 in | Wt 186.0 lb

## 2023-03-20 DIAGNOSIS — J3089 Other allergic rhinitis: Secondary | ICD-10-CM

## 2023-03-20 DIAGNOSIS — H6123 Impacted cerumen, bilateral: Secondary | ICD-10-CM | POA: Diagnosis not present

## 2023-03-20 DIAGNOSIS — I110 Hypertensive heart disease with heart failure: Secondary | ICD-10-CM | POA: Diagnosis not present

## 2023-03-20 DIAGNOSIS — I5032 Chronic diastolic (congestive) heart failure: Secondary | ICD-10-CM

## 2023-03-20 DIAGNOSIS — E782 Mixed hyperlipidemia: Secondary | ICD-10-CM | POA: Diagnosis not present

## 2023-03-20 DIAGNOSIS — G20A2 Parkinson's disease without dyskinesia, with fluctuations: Secondary | ICD-10-CM

## 2023-03-20 DIAGNOSIS — R1319 Other dysphagia: Secondary | ICD-10-CM | POA: Diagnosis not present

## 2023-03-20 HISTORY — DX: Other dysphagia: R13.19

## 2023-03-20 HISTORY — DX: Parkinson's disease without dyskinesia, with fluctuations: G20.A2

## 2023-03-20 NOTE — Progress Notes (Signed)
Subjective:  Patient ID: Travis Reese, male    DOB: 09-May-1946  Age: 77 y.o. MRN: [id number]  Chief Complaint  Patient presents with   Dysphagia    HPI Parkinson's disease:  Current Medications: Sinemet IR 25-100mg  take 2 tablets TID, Sinemet CR 50-200 1 tablet at bedtime. Fairly stable.  Hyperlipidemia: Taking Atorvastatin 20 mg daily, Krill oil.  BPH: Current medications: Tamsulosin in evening.  HYPERTENSION: Coreg 25 mg TWICE DAILY, Spironolactone 25 mg daily Back pain: Hydrocodone takes as needed B12 deficiency: Taking b12 gummies daily.       01/24/2023    9:52 AM 09/30/2022   12:10 PM 08/20/2022    4:00 PM 08/05/2022    5:39 PM 07/19/2022    9:46 AM  Depression screen PHQ 2/9  Decreased Interest 3  3  3   Down, Depressed, Hopeless 2  2  3   PHQ - 2 Score 5  5  6   Altered sleeping 0  0  0  Tired, decreased energy 3  3  2   Change in appetite 0  0  0  Feeling bad or failure about yourself  3  2  1   Trouble concentrating 0  1  1  Moving slowly or fidgety/restless 0  0  0  Suicidal thoughts 0  0  0  PHQ-9 Score 11  11  10   Difficult doing work/chores Not difficult at all  Somewhat difficult  Somewhat difficult     Information is confidential and restricted. Go to Review Flowsheets to unlock data.        03/04/2023    2:10 PM  Fall Risk   Falls in the past year? 0  Number falls in past yr: 0  Injury with Fall? 0  Follow up Falls evaluation completed    Patient Care Team: Blane Ohara, MD as PCP - General (Internal Medicine) Tat, Octaviano Batty, DO as Consulting Physician (Neurology) Georgeanna Lea, MD as Consulting Physician (Cardiology) Patricia Nettle, MD (Orthopedic Surgery) Zettie Pho, [facility] (Inactive) (Pharmacist)   Review of Systems  Constitutional:  Negative for chills and fever.  HENT:  Positive for congestion and postnasal drip. Negative for rhinorrhea and sore throat.   Respiratory:  Positive for cough. Negative for shortness of breath.    Cardiovascular:  Negative for chest pain and palpitations.  Gastrointestinal:  Positive for constipation. Negative for abdominal pain, diarrhea, nausea and vomiting.       Difficulty swallowing   Genitourinary:  Negative for dysuria and urgency.  Musculoskeletal:  Negative for arthralgias, back pain and myalgias.  Neurological:  Positive for dizziness, tremors and headaches.  Psychiatric/Behavioral:  Negative for dysphoric mood. The patient is not nervous/anxious.     Current Outpatient Medications on File Prior to Visit  Medication Sig Dispense Refill   acetaminophen (TYLENOL) 325 MG tablet Take 2 tablets (650 mg total) by mouth every 6 (six) hours as needed for mild pain (or Fever >/= 101).     albuterol (VENTOLIN HFA) 108 (90 Base) MCG/ACT inhaler Inhale 1-2 puffs into the lungs every 6 (six) hours as needed for wheezing or shortness of breath.     aspirin EC 325 MG EC tablet Take 1 tablet (325 mg total) by mouth daily. 30 tablet 0   atorvastatin (LIPITOR) 20 MG tablet Take 1 tablet (20 mg total) by mouth daily. 100 tablet 1   buPROPion (WELLBUTRIN XL) 150 MG 24 hr tablet Take 1 tablet (150 mg total) by mouth daily. 7 tablet 0  carbidopa-levodopa (SINEMET CR) 50-200 MG tablet TAKE 1 TABLET BY MOUTH EVERY  NIGHT AT BEDTIME 90 tablet 0   carbidopa-levodopa (SINEMET IR) 25-100 MG tablet TAKE 2 TABLETS BY MOUTH 3 TIMES  DAILY AT 9 AM, 1 PM, AND 5 PM 600 tablet 0   carvedilol (COREG) 25 MG tablet Take 0.5 tablets (12.5 mg total) by mouth in the morning and at bedtime. 180 tablet 2   celecoxib (CELEBREX) 200 MG capsule TAKE 1 CAPSULE BY MOUTH DAILY 100 capsule 2   clonazePAM (KLONOPIN) 1 MG tablet TAKE 1 TABLET BY MOUTH AT BEDTIME AND 1 DAILY AS NEEDED FOR ANXIETY 45 tablet 1   Cyanocobalamin 1000 MCG SUBL Place 1,000 mcg under the tongue daily.     fluticasone (FLONASE) 50 MCG/ACT nasal spray Place 1 spray into both nostrils 3 times/day as needed-between meals & bedtime. 48 g 3    HYDROcodone-acetaminophen (NORCO/VICODIN) 5-325 MG tablet Take 1 tablet by mouth every 8 (eight) hours as needed.     KRILL OIL PO Take 1 tablet by mouth daily.     montelukast (SINGULAIR) 10 MG tablet TAKE 1 TABLET BY MOUTH AT  BEDTIME 100 tablet 2   rOPINIRole (REQUIP) 1 MG tablet 1 at 9am, 1 at 1pm, 1/2 tablet at 5pm 270 tablet 0   sertraline (ZOLOFT) 50 MG tablet 25 mg at night for one week, then 50 mg at night for one week, then 100 mg at night 90 tablet 0   tamsulosin (FLOMAX) 0.4 MG CAPS capsule TAKE 2 CAPSULES BY MOUTH DAILY  AFTER SUPPER 200 capsule 2   Vitamin D, Ergocalciferol, (DRISDOL) 1.25 MG (50000 UNIT) CAPS capsule TAKE 1 CAPSULE BY MOUTH EVERY  MONDAY 15 capsule 2   Zinc 50 MG TABS Take 50 mg by mouth at bedtime.     No current facility-administered medications on file prior to visit.   Past Medical History:  Diagnosis Date   Allergic rhinosinusitis    Anxiety    Aortic valve sclerosis    Arthritis    knees, neck, shoulder   Carotid artery stenosis 2022   Mild   CHF (congestive heart failure) (HCC)    Cubital tunnel syndrome    DDD (degenerative disc disease), cervical    Depression    Elevated PSA    denies per patient   HTN (hypertension)    Hyperlipidemia    Hyperplasia of prostate with lower urinary tract symptoms (LUTS)    Hypertension    Mild dilation of ascending aorta (HCC)    Parkinson disease    Prolapsed internal hemorrhoids, grade 3 04/19/2015   Rectal prolapse    Scoliosis of lumbosacral spine    Stroke (HCC)    x2   Tremor    Vitamin D deficiency    Wears hearing aid    bilateral   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   BENTALL PROCEDURE N/A 05/01/2018   Procedure: BIOLOGICAL BENTALL PROCEDURE AORTIC ROOT REPLACEMENT WITH VALSALVA GRAFT & INSPIRIS VALVE.;  Surgeon: Purcell Nails, MD;  Location: [facility] OR;  Service: Open Heart Surgery;  Laterality: N/A;   BUBBLE STUDY  04/13/2021    Procedure: BUBBLE STUDY;  Surgeon: Jodelle Red, MD;  Location: [facility] ENDOSCOPY;  Service: Cardiovascular;;   CATARACT EXTRACTION Bilateral 04/2018   COLONOSCOPY  2007   NASAL SINUS SURGERY  2006   RIGHT/LEFT HEART CATH AND CORONARY ANGIOGRAPHY N/A 04/27/2018   Procedure: RIGHT/LEFT HEART CATH AND  CORONARY ANGIOGRAPHY;  Surgeon: Dolores Patty, MD;  Location: [facility] INVASIVE CV LAB;  Service: Cardiovascular;  Laterality: N/A;   TEE WITHOUT CARDIOVERSION N/A 04/27/2018   Procedure: TRANSESOPHAGEAL ECHOCARDIOGRAM (TEE);  Surgeon: Dolores Patty, MD;  Location: [facility] ENDOSCOPY;  Service: Cardiovascular;  Laterality: N/A;   TEE WITHOUT CARDIOVERSION N/A 05/01/2018   Procedure: TRANSESOPHAGEAL ECHOCARDIOGRAM (TEE);  Surgeon: Purcell Nails, MD;  Location: [facility] OR;  Service: Open Heart Surgery;  Laterality: N/A;   TEE WITHOUT CARDIOVERSION N/A 04/13/2021   Procedure: TRANSESOPHAGEAL ECHOCARDIOGRAM (TEE);  Surgeon: Jodelle Red, MD;  Location: [facility] ENDOSCOPY;  Service: Cardiovascular;  Laterality: N/A;   TRANSANAL HEMORRHOIDAL DEARTERIALIZATION N/A 11/15/2015   Procedure: TRANSANAL HEMORRHOIDAL DEARTERIALIZATION;  Surgeon: Romie Levee, MD;  Location: [facility];  Service: General;  Laterality: N/A;    Family History  Problem Relation Age of Onset   COPD Mother    Heart attack Mother    Colon polyps Father    Prostate cancer Father    Parkinson's disease Father    Hyperlipidemia Father    Depression Sister    Alcohol abuse Sister    Hypertension Sister    Breast cancer Sister    Hypertension Brother    Prostate cancer Paternal Uncle    Social History   Socioeconomic History   Marital status: Single    Spouse name: Not on file   Number of children: 2   Years of education: Not on file   Highest education level: 12th grade  Occupational History   Occupation: retired    Comment: security guard  Tobacco Use   Smoking status: Every Day    Current  packs/day: 0.15    Average packs/day: 0.2 packs/day for 50.0 years (7.5 ttl pk-yrs)    Types: Cigarettes   Smokeless tobacco: Never   Tobacco comments:    Patient has cut down to three cigarettes a day  Vaping Use   Vaping status: Former  Substance and Sexual Activity   Alcohol use: Not Currently    Comment: Occasional   Drug use: No   Sexual activity: Not Currently  Other Topics Concern   Not on file  Social History Narrative   1 son, 1 daughter   Agricultural consultant work   3 caffeine/day   Right handed   Social Determinants of Health   Financial Resource Strain: Low Risk  (01/23/2023)   Overall Financial Resource Strain (CARDIA)    Difficulty of Paying Living Expenses: Not very hard  Food Insecurity: No Food Insecurity (01/23/2023)   Hunger Vital Sign    Worried About Running Out of Food in the Last Year: Never true    Ran Out of Food in the Last Year: Never true  Transportation Needs: No Transportation Needs (01/23/2023)   PRAPARE - Administrator, Civil Service (Medical): No    Lack of Transportation (Non-Medical): No  Physical Activity: Inactive (01/23/2023)   Exercise Vital Sign    Days of Exercise per Week: 0 days    Minutes of Exercise per Session: 20 min  Stress: Stress Concern Present (01/23/2023)   [employer] of Occupational Health - Occupational Stress Questionnaire    Feeling of Stress : Rather much  Social Connections: Unknown (01/23/2023)   Social Connection and Isolation Panel [NHANES]    Frequency of Communication with Friends and Family: More than three times a week    Frequency of Social Gatherings with Friends and Family: Twice a week    Attends Religious Services: Patient  declined    Active Member of Clubs or Organizations: Patient declined    Attends Banker Meetings: Never    Marital Status: Living with partner    Objective:   Today's Vitals   03/20/23 1729  BP: 118/80  Pulse: 68  Resp: 12  Temp: 98.6 F (37 C)  Weight:  186 lb (84.4 kg)  Height: 6\' 2"  (1.88 m)   Body mass index is 23.88 kg/m.      03/20/2023    5:29 PM 03/18/2023   10:56 AM 03/04/2023    2:09 PM  BP/Weight  Systolic BP 118 120 112  Diastolic BP 80 82 72  Wt. (Lbs) 186 186.2 186.7  BMI 23.88 kg/m2 23.91 kg/m2 23.97 kg/m2    Physical Exam Vitals reviewed.  Constitutional:      Appearance: He is normal weight.  HENT:     Right Ear: There is impacted cerumen.     Left Ear: There is impacted cerumen.     Nose: Nose normal.     Right Turbinates: Pale.     Left Turbinates: Pale.     Right Sinus: No maxillary sinus tenderness or frontal sinus tenderness.     Left Sinus: No maxillary sinus tenderness or frontal sinus tenderness.     Mouth/Throat:     Mouth: Mucous membranes are dry.  Neck:     Vascular: No carotid bruit.  Cardiovascular:     Rate and Rhythm: Normal rate and regular rhythm.  Pulmonary:     Effort: Pulmonary effort is normal. No respiratory distress.     Breath sounds: Normal breath sounds.  Abdominal:     General: Bowel sounds are normal.     Palpations: Abdomen is soft.     Tenderness: There is no abdominal tenderness.  Neurological:     Mental Status: He is alert and oriented to person, place, and time.  Psychiatric:        Mood and Affect: Mood normal.        Behavior: Behavior normal.     Diabetic Foot Exam - Simple   No data filed      Lab Results  Component Value Date   WBC 8.3 01/24/2023   HGB 13.7 01/24/2023   HCT 40.4 01/24/2023   PLT 161 01/24/2023   GLUCOSE 84 01/24/2023   CHOL 106 01/24/2023   TRIG 58 01/24/2023   HDL 52 01/24/2023   LDLCALC 41 01/24/2023   ALT 5 01/24/2023   AST 17 01/24/2023   NA 142 01/24/2023   K 5.0 01/24/2023   CL 103 01/24/2023   CREATININE 0.87 01/24/2023   BUN 18 01/24/2023   CO2 25 01/24/2023   TSH 1.690 01/24/2023   INR 1.58 05/01/2018      Assessment & Plan:    Chronic diastolic CHF (congestive heart failure) (HCC) Assessment &  Plan: Continue coreg 25 mg twice daily and spironolactone 25 mg daily.  Management per specialist.     Mixed hyperlipidemia Assessment & Plan: Well controlled.  No changes to medicines. Continue atorvastatin 20 mg daily and krill oil. Continue to work on eating a healthy diet and exercise.      Esophageal dysphagia Assessment & Plan: Order barium swallow test.  Orders: -     SLP modified barium swallow; Future  Bilateral impacted cerumen Assessment & Plan: Debrox drops to both ears at night for 5 days.   Allergic rhinitis due to other allergic trigger, unspecified seasonality Assessment & Plan: Use Nasal saline.  Parkinson's disease without dyskinesia, with fluctuating manifestations Assessment & Plan: The current medical regimen is effective;  continue present plan and medications.       No orders of the defined types were placed in this encounter.   Orders Placed This Encounter  Procedures   SLP modified barium swallow     Follow-up: Return in about 3 months (around 06/20/2023) for fasting, chronic follow up.   I,Carolyn M Morrison,acting as a Neurosurgeon for Blane Ohara, MD.,have documented all relevant documentation on the behalf of Blane Ohara, MD,as directed by  Blane Ohara, MD while in the presence of Blane Ohara, MD.   Clayborn Bigness I Leal-Borjas,acting as a scribe for Blane Ohara, MD.,have documented all relevant documentation on the behalf of Blane Ohara, MD,as directed by  Blane Ohara, MD while in the presence of Blane Ohara, MD.    An After Visit Summary was printed and given to the patient.  Blane Ohara, MD [facility] [phone]

## 2023-03-20 NOTE — Patient Instructions (Signed)
Debrox drops to both ears at night for 5 days. Nurse visit- ear irrigation in 7 days.

## 2023-03-22 NOTE — Assessment & Plan Note (Signed)
Order barium swallow test.

## 2023-03-22 NOTE — Assessment & Plan Note (Signed)
Continue coreg 25 mg twice daily and spironolactone 25 mg daily.  Management per specialist.

## 2023-03-22 NOTE — Assessment & Plan Note (Signed)
Use Nasal saline.

## 2023-03-22 NOTE — Assessment & Plan Note (Signed)
Debrox drops to both ears at night for 5 days.

## 2023-03-22 NOTE — Assessment & Plan Note (Signed)
Well controlled.  No changes to medicines. Continue atorvastatin 20 mg daily and krill oil. Continue to work on eating a healthy diet and exercise.

## 2023-03-22 NOTE — Assessment & Plan Note (Signed)
The current medical regimen is effective;  continue present plan and medications.  

## 2023-03-23 ENCOUNTER — Encounter: Payer: Self-pay | Admitting: Family Medicine

## 2023-03-24 ENCOUNTER — Telehealth: Payer: Self-pay

## 2023-03-24 ENCOUNTER — Other Ambulatory Visit: Payer: Self-pay | Admitting: Neurology

## 2023-03-24 DIAGNOSIS — G20A1 Parkinson's disease without dyskinesia, without mention of fluctuations: Secondary | ICD-10-CM

## 2023-03-24 NOTE — Telephone Encounter (Signed)
pt left a message that he needs a refill on the sertraline. it needs to go to [facility]. (looks as if he is suppose to be on sertraline 100mg 

## 2023-03-24 NOTE — Telephone Encounter (Signed)
Could you find out which medication he is referring to? He should have sertraline to last until his next visit as below.   sertraline (ZOLOFT) 50 MG tablet 90 tablet 0 02/04/2023 -  Sig: 25 mg at night for one week, then 50 mg at night for one week, then 100 mg at night  Sent to pharmacy as: sertraline (ZOLOFT) 50 MG tablet  Notes to Pharmacy: Cross taper from duloxetine  E-Prescribing Status: Receipt confirmed by pharmacy (02/04/2023 11:11 AM EDT)

## 2023-03-25 ENCOUNTER — Other Ambulatory Visit: Payer: Self-pay | Admitting: Psychiatry

## 2023-03-25 MED ORDER — SERTRALINE HCL 100 MG PO TABS: 100.0000 mg | ORAL_TABLET | Freq: Every day | ORAL | 0 refills | Status: DC

## 2023-03-25 NOTE — Telephone Encounter (Signed)
ordered

## 2023-03-25 NOTE — Telephone Encounter (Signed)
per the pharmacy pt is out with the instruction you written.     Disp Refills Start End   sertraline (ZOLOFT) 50 MG tablet 90 tablet 0 02/04/2023 --   Sig: 25 mg at night for one week, then 50 mg at night for one week, then 100 mg at night   Sent to pharmacy as: sertraline (ZOLOFT) 50 MG tablet   Notes to Pharmacy: Cross taper from duloxetine   E-Prescribing Status: Receipt confirmed by pharmacy (02/04/2023 11:11 AM EDT)      Bonita Quin wanted him to take two to equal 100mg . For the 1st week he would have take 4 pills 2nd week 8 pills After that  from that date to now it 68 pills

## 2023-03-25 NOTE — Telephone Encounter (Signed)
Pt.notified

## 2023-03-27 ENCOUNTER — Ambulatory Visit: Payer: Medicare Other | Admitting: Psychiatry
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
## 2023-04-08 ENCOUNTER — Telehealth: Payer: Medicare Other | Admitting: Psychiatry

## 2023-04-08 ENCOUNTER — Encounter: Payer: Self-pay | Admitting: Psychiatry

## 2023-04-08 DIAGNOSIS — F419 Anxiety disorder, unspecified: Secondary | ICD-10-CM

## 2023-04-08 DIAGNOSIS — F3341 Major depressive disorder, recurrent, in partial remission: Secondary | ICD-10-CM

## 2023-04-08 MED ORDER — SERTRALINE HCL 100 MG PO TABS: 100.0000 mg | ORAL_TABLET | Freq: Every day | ORAL | 0 refills | Status: DC

## 2023-04-08 NOTE — Patient Instructions (Signed)
Continue sertraline 100 mg at night  Continue bupropion 150 mg daily  Next appointment: 9/30 at 3 pm

## 2023-04-10 NOTE — Progress Notes (Signed)
Assessment/Plan:   1.  Parkinsons Disease, worsened by Abilify  -Patient off Abilify since April, 2022  -pt improved when he went off of Abilify.  -Patient off vraylar  -continue carbidopa/levodopa 25/100, 2 tablet 3 times daily, 9am/1pm/5pm  -continue carbidopa/levodopa 50/200 cr at bed to see if helps with first AM on  -for now will continue ropinirole at 1 mg, 1/1/0.5.  may increase that back in the future but looks pretty good today.    -Would like to see reduction in clonazepam.  Patient currently on clonazepam, 1 mg and goes through 45 tablets/month per PDMP.  I would like to see complete discontinuation of the daytime dosage, due to risk for falls and confusion.  Discussed again.  Last picked up 6/10.  -Continue Inbrija.  He has samples at home as insurance has denied it 2.  History of cerebral infarction, with moderate sized PFO  -Patient is on aspirin  -Patient is on statin.  3.  Essential tremor  --the tremor he describes (eating) is likely from this but I didn't really see much tremor today  -he used to be on primidone but wanted to stop that.  I didn't want to add more medication nor did he.  -discussed how anxiety affects tremor  4.  Depression   -Now following with Dr. Vanetta Shawl.  Sent message to her today abou tthe patient  5.  Lightheadedness with significant Neurogenic Orthostatic Hypotension in the office today  -likely due to Parkinsons Disease in combination with BP meds.  Spironolactone has been d/c.  Watching carvedilol.     Subjective:   Burna Mortimer Shearon was seen today in follow up for Parkinsons disease.  My previous records were reviewed prior to todays visit as well as outside records available to me.  I saw patient last month.  He was worked in at the request of the psychiatrist.  Patient was complaining about more tremor.  They were not sure if the anxiety was exacerbating the baseline tremor, or if perhaps it was a side effect from other medicines,  including the sertraline.  His sertraline had not been changed recently.  Last visit, I did reduce his Requip, although I was not convinced of that the hearing music was caused by the ropinirole.  It did not change at all when I reduced the ropinirole, and his psychiatrist told me it would be okay to increase it, if necessary.  He states that the music is a little better the last few days.    Pt reports today that he notes tremor with use of the hands.  Both hands shake equally.  He is able to eat/drink without spilling the food/drink.    Current prescribed movement disorder medications: carbidopa/levodopa 25/100, 2 tablets at 9am/1pm/5pm  carbidopa/levodopa 50/200 CR q hs  Inbrija  Ropinirole 1 mg, 1 tablet at 9 AM, 1 tablet at 1 PM, half tablet at 5 PM  PREVIOUS MEDICATIONS: Sinemet; primidone (for ET - and did okay stopping that); abilify; vraylar; requip (hearing music but not voices or seeing things - also had stopped vraylar at same time)  ALLERGIES:  No Known Allergies  CURRENT MEDICATIONS:  Outpatient Encounter Medications as of 04/14/2023  Medication Sig   acetaminophen (TYLENOL) 325 MG tablet Take 2 tablets (650 mg total) by mouth every 6 (six) hours as needed for mild pain (or Fever >/= 101).   albuterol (VENTOLIN HFA) 108 (90 Base) MCG/ACT inhaler Inhale 1-2 puffs into the lungs every 6 (six) hours as  needed for wheezing or shortness of breath.   aspirin EC 325 MG EC tablet Take 1 tablet (325 mg total) by mouth daily.   atorvastatin (LIPITOR) 20 MG tablet Take 1 tablet (20 mg total) by mouth daily.   buPROPion (WELLBUTRIN XL) 150 MG 24 hr tablet Take 1 tablet (150 mg total) by mouth daily.   carbidopa-levodopa (SINEMET CR) 50-200 MG tablet TAKE 1 TABLET BY MOUTH EVERY  NIGHT AT BEDTIME   carbidopa-levodopa (SINEMET IR) 25-100 MG tablet TAKE 2 TABLETS BY MOUTH 3 TIMES  DAILY AT 9 AM, 1 PM, AND 5 PM   carvedilol (COREG) 25 MG tablet Take 0.5 tablets (12.5 mg total) by mouth in the  morning and at bedtime.   celecoxib (CELEBREX) 200 MG capsule TAKE 1 CAPSULE BY MOUTH DAILY   clonazePAM (KLONOPIN) 1 MG tablet TAKE 1 TABLET BY MOUTH AT BEDTIME AND 1 DAILY AS NEEDED FOR ANXIETY   Cyanocobalamin 1000 MCG SUBL Place 1,000 mcg under the tongue daily.   fluticasone (FLONASE) 50 MCG/ACT nasal spray Place 1 spray into both nostrils 3 times/day as needed-between meals & bedtime.   HYDROcodone-acetaminophen (NORCO/VICODIN) 5-325 MG tablet Take 1 tablet by mouth every 8 (eight) hours as needed.   KRILL OIL PO Take 1 tablet by mouth daily.   montelukast (SINGULAIR) 10 MG tablet TAKE 1 TABLET BY MOUTH AT  BEDTIME   rOPINIRole (REQUIP) 1 MG tablet TAKE 1 tablet at 9am, 1 at 1 pm and 1/2 tablet at 5pm.   sertraline (ZOLOFT) 100 MG tablet Take 1 tablet (100 mg total) by mouth at bedtime.   sertraline (ZOLOFT) 100 MG tablet Take 1 tablet (100 mg total) by mouth daily.   tamsulosin (FLOMAX) 0.4 MG CAPS capsule TAKE 2 CAPSULES BY MOUTH DAILY  AFTER SUPPER   Vitamin D, Ergocalciferol, (DRISDOL) 1.25 MG (50000 UNIT) CAPS capsule TAKE 1 CAPSULE BY MOUTH EVERY  MONDAY   Zinc 50 MG TABS Take 50 mg by mouth at bedtime.   No facility-administered encounter medications on file as of 04/14/2023.    Objective:   PHYSICAL EXAMINATION:    VITALS:   Vitals:   04/14/23 0825  BP: 110/74  Pulse: 70  SpO2: 95%  Weight: 188 lb 6.4 oz (85.5 kg)  Height: 6\' 2"  (1.88 m)     GEN:  The patient appears stated age and is in NAD. HEENT:  Normocephalic, atraumatic.  The mucous membranes are moist. The superficial temporal arteries are without ropiness or tenderness. CV:  RRR Lungs:  CTAB Neck/HEME:  There are no carotid bruits bilaterally.  Neurological examination:  Orientation: The patient is alert and oriented x3. Cranial nerves: There is good facial symmetry with facial hypomimia. The speech is fluent and clear. Soft palate rises symmetrically and there is no tongue deviation. Hearing is intact  to conversational tone. Sensation: Sensation is intact to light touch throughout Motor: Strength is at least antigravity x4.  Movement examination: Tone: There is nl tone in the UE/LE Abnormal movements: no rest tremor.  No postural or intention tremor.  Archimedes spirals without tremor today  Coordination:  There is mild decremation with RAM's, with any form of RAMS, including alternating supination and pronation of the forearm, hand opening and closing, finger taps, heel taps and toe taps on the bilaterally Gait and Station: The patient has  difficulty arising out of a deep-seated chair without the use of the hands. The patient's stride length is decreased and he is dragging the R leg.  I have reviewed and interpreted the following labs independently    Chemistry      Component Value Date/Time   NA 142 01/24/2023 1030   K 5.0 01/24/2023 1030   CL 103 01/24/2023 1030   CO2 25 01/24/2023 1030   BUN 18 01/24/2023 1030   CREATININE 0.87 01/24/2023 1030   CREATININE 0.77 07/21/2017 1401      Component Value Date/Time   CALCIUM 9.4 01/24/2023 1030   ALKPHOS 73 01/24/2023 1030   AST 17 01/24/2023 1030   ALT 5 01/24/2023 1030   BILITOT 0.6 01/24/2023 1030       Lab Results  Component Value Date   WBC 8.3 01/24/2023   HGB 13.7 01/24/2023   HCT 40.4 01/24/2023   MCV 105 (H) 01/24/2023   PLT 161 01/24/2023    Lab Results  Component Value Date   TSH 1.690 01/24/2023    Total time spent on today's visit was 31 minutes, including both face-to-face time and nonface-to-face time.  Time included that spent on review of records (prior notes available to me/labs/imaging if pertinent), discussing treatment and goals, answering patient's questions and coordinating care.   Cc:  Blane Ohara, MD

## 2023-04-14 ENCOUNTER — Ambulatory Visit: Payer: Medicare Other | Admitting: Neurology

## 2023-04-14 ENCOUNTER — Encounter: Payer: Self-pay | Admitting: Neurology

## 2023-04-14 VITALS — BP 110/74 | HR 70 | Ht 74.0 in | Wt 188.4 lb

## 2023-04-14 DIAGNOSIS — G20A1 Parkinson's disease without dyskinesia, without mention of fluctuations: Secondary | ICD-10-CM | POA: Diagnosis not present

## 2023-04-14 DIAGNOSIS — G903 Multi-system degeneration of the autonomic nervous system: Secondary | ICD-10-CM | POA: Diagnosis not present

## 2023-04-14 DIAGNOSIS — G250 Essential tremor: Secondary | ICD-10-CM | POA: Diagnosis not present

## 2023-04-14 NOTE — Patient Instructions (Signed)
 SAVE THE DATE!  We are planning a Parkinsons Disease educational symposium at [facility] in [place] on October 11.  More details to come!  If you would like to be added to our email list to get further information, email [email or web address].  We hope to see you there!

## 2023-04-15 DIAGNOSIS — M47816 Spondylosis without myelopathy or radiculopathy, lumbar region: Secondary | ICD-10-CM | POA: Diagnosis not present

## 2023-04-16 ENCOUNTER — Other Ambulatory Visit: Payer: Self-pay | Admitting: Neurology

## 2023-04-16 DIAGNOSIS — G20A1 Parkinson's disease without dyskinesia, without mention of fluctuations: Secondary | ICD-10-CM

## 2023-04-18 ENCOUNTER — Other Ambulatory Visit: Payer: Self-pay | Admitting: Family Medicine

## 2023-04-18 DIAGNOSIS — N401 Enlarged prostate with lower urinary tract symptoms: Secondary | ICD-10-CM

## 2023-04-21 DIAGNOSIS — R1310 Dysphagia, unspecified: Secondary | ICD-10-CM | POA: Diagnosis not present

## 2023-05-01 ENCOUNTER — Ambulatory Visit: Payer: Medicare Other | Admitting: Neurology

## 2023-05-06 DIAGNOSIS — H524 Presbyopia: Secondary | ICD-10-CM | POA: Diagnosis not present

## 2023-05-06 DIAGNOSIS — H52203 Unspecified astigmatism, bilateral: Secondary | ICD-10-CM | POA: Diagnosis not present

## 2023-05-06 DIAGNOSIS — Z961 Presence of intraocular lens: Secondary | ICD-10-CM | POA: Diagnosis not present

## 2023-05-21 ENCOUNTER — Other Ambulatory Visit: Payer: Self-pay | Admitting: Family Medicine
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
## 2023-05-27 ENCOUNTER — Ambulatory Visit: Payer: Medicare Other | Admitting: Family Medicine

## 2023-05-27 DIAGNOSIS — M47816 Spondylosis without myelopathy or radiculopathy, lumbar region: Secondary | ICD-10-CM | POA: Diagnosis not present

## 2023-06-02 ENCOUNTER — Ambulatory Visit: Payer: Medicare Other | Admitting: Psychiatry

## 2023-06-02 ENCOUNTER — Encounter: Payer: Self-pay | Admitting: Psychiatry

## 2023-06-02 VITALS — BP 118/68 | HR 82 | Temp 97.8°F | Ht 74.0 in | Wt 183.6 lb

## 2023-06-02 DIAGNOSIS — F3341 Major depressive disorder, recurrent, in partial remission: Secondary | ICD-10-CM

## 2023-06-02 DIAGNOSIS — G4700 Insomnia, unspecified: Secondary | ICD-10-CM | POA: Diagnosis not present

## 2023-06-02 DIAGNOSIS — F419 Anxiety disorder, unspecified: Secondary | ICD-10-CM

## 2023-06-02 MED ORDER — SERTRALINE HCL 100 MG PO TABS: 150.0000 mg | ORAL_TABLET | Freq: Every day | ORAL | 0 refills | Status: DC

## 2023-06-02 NOTE — Patient Instructions (Signed)
Increase sertraline 150 mg at night  Continue bupropion 150 mg daily  Next appointment: 11/19 at 2 30

## 2023-06-24 DIAGNOSIS — M48062 Spinal stenosis, lumbar region with neurogenic claudication: Secondary | ICD-10-CM | POA: Diagnosis not present

## 2023-06-25 NOTE — Progress Notes (Signed)
Acute Office Visit  Subjective:    Patient ID: Travis Reese, male    DOB: 1945-09-23, 77 y.o.   MRN: [id number]  Chief Complaint  Patient presents with   Sore Throat    HPI: Patient is in today for Upper respiratory symptoms He complains of congestion, no  fever, productive cough with  clear colored sputum, and sore throat Onset of symptoms was about a week ago and staying constant.He is drinking plenty of fluids.     Past Medical History:  Diagnosis Date   Allergic rhinosinusitis    Anxiety    Aortic valve sclerosis    Arthritis    knees, neck, shoulder   Carotid artery stenosis 2022   Mild   CHF (congestive heart failure) (HCC)    Cubital tunnel syndrome    DDD (degenerative disc disease), cervical    Depression    Elevated PSA    denies per patient   HTN (hypertension)    Hyperlipidemia    Hyperplasia of prostate with lower urinary tract symptoms (LUTS)    Hypertension    Mild dilation of ascending aorta (HCC)    Parkinson disease    Prolapsed internal hemorrhoids, grade 3 04/19/2015   Rectal prolapse    Scoliosis of lumbosacral spine    Stroke (HCC)    x2   Tremor    Vitamin D deficiency    Wears hearing aid    bilateral    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   BENTALL PROCEDURE N/A 05/01/2018   Procedure: BIOLOGICAL BENTALL PROCEDURE AORTIC ROOT REPLACEMENT WITH VALSALVA GRAFT & INSPIRIS VALVE.;  Surgeon: Purcell Nails, MD;  Location: [facility] OR;  Service: Open Heart Surgery;  Laterality: N/A;   BUBBLE STUDY  04/13/2021   Procedure: BUBBLE STUDY;  Surgeon: Jodelle Red, MD;  Location: [facility] ENDOSCOPY;  Service: Cardiovascular;;   CATARACT EXTRACTION Bilateral 04/2018   COLONOSCOPY  2007   NASAL SINUS SURGERY  2006   RIGHT/LEFT HEART CATH AND CORONARY ANGIOGRAPHY N/A 04/27/2018   Procedure: RIGHT/LEFT HEART CATH AND CORONARY ANGIOGRAPHY;  Surgeon: Dolores Patty, MD;  Location:  [facility] INVASIVE CV LAB;  Service: Cardiovascular;  Laterality: N/A;   TEE WITHOUT CARDIOVERSION N/A 04/27/2018   Procedure: TRANSESOPHAGEAL ECHOCARDIOGRAM (TEE);  Surgeon: Dolores Patty, MD;  Location: [facility] ENDOSCOPY;  Service: Cardiovascular;  Laterality: N/A;   TEE WITHOUT CARDIOVERSION N/A 05/01/2018   Procedure: TRANSESOPHAGEAL ECHOCARDIOGRAM (TEE);  Surgeon: Purcell Nails, MD;  Location: [facility] OR;  Service: Open Heart Surgery;  Laterality: N/A;   TEE WITHOUT CARDIOVERSION N/A 04/13/2021   Procedure: TRANSESOPHAGEAL ECHOCARDIOGRAM (TEE);  Surgeon: Jodelle Red, MD;  Location: [facility] ENDOSCOPY;  Service: Cardiovascular;  Laterality: N/A;   TRANSANAL HEMORRHOIDAL DEARTERIALIZATION N/A 11/15/2015   Procedure: TRANSANAL HEMORRHOIDAL DEARTERIALIZATION;  Surgeon: Romie Levee, MD;  Location: [facility];  Service: General;  Laterality: N/A;    Family History  Problem Relation Age of Onset   COPD Mother    Heart attack Mother    Colon polyps Father    Prostate cancer Father    Parkinson's disease Father    Hyperlipidemia Father    Depression Sister    Alcohol abuse Sister    Hypertension Sister    Breast cancer Sister    Hypertension Brother    Prostate cancer Paternal Uncle     Social History   Socioeconomic History   Marital status: Single    Spouse name:  Not on file   Number of children: 2   Years of education: Not on file   Highest education level: 12th grade  Occupational History   Occupation: retired    Comment: security guard  Tobacco Use   Smoking status: Every Day    Current packs/day: 0.15    Average packs/day: 0.2 packs/day for 50.0 years (7.5 ttl pk-yrs)    Types: Cigarettes   Smokeless tobacco: Never   Tobacco comments:    Patient has cut down to three cigarettes a day  Vaping Use   Vaping status: Former  Substance and Sexual Activity   Alcohol use: Not Currently    Comment: Occasional   Drug use: No   Sexual activity: Not Currently   Other Topics Concern   Not on file  Social History Narrative   1 son, 1 daughter   Agricultural consultant work   3 caffeine/day   Right handed   Social Determinants of Health   Financial Resource Strain: Low Risk  (01/23/2023)   Overall Financial Resource Strain (CARDIA)    Difficulty of Paying Living Expenses: Not very hard  Food Insecurity: No Food Insecurity (01/23/2023)   Hunger Vital Sign    Worried About Running Out of Food in the Last Year: Never true    Ran Out of Food in the Last Year: Never true  Transportation Needs: No Transportation Needs (01/23/2023)   PRAPARE - Administrator, Civil Service (Medical): No    Lack of Transportation (Non-Medical): No  Physical Activity: Inactive (01/23/2023)   Exercise Vital Sign    Days of Exercise per Week: 0 days    Minutes of Exercise per Session: 20 min  Stress: Stress Concern Present (01/23/2023)   [employer] of Occupational Health - Occupational Stress Questionnaire    Feeling of Stress : Rather much  Social Connections: Unknown (01/23/2023)   Social Connection and Isolation Panel [NHANES]    Frequency of Communication with Friends and Family: More than three times a week    Frequency of Social Gatherings with Friends and Family: Twice a week    Attends Religious Services: Patient declined    Database administrator or Organizations: Patient declined    Attends Banker Meetings: Never    Marital Status: Living with partner  Intimate Partner Violence: Not At Risk (08/10/2021)   Humiliation, Afraid, Rape, and Kick questionnaire    Fear of Current or Ex-Partner: No    Emotionally Abused: No    Physically Abused: No    Sexually Abused: No    Outpatient Medications Prior to Visit  Medication Sig Dispense Refill   acetaminophen (TYLENOL) 325 MG tablet Take 2 tablets (650 mg total) by mouth every 6 (six) hours as needed for mild pain (or Fever >/= 101).     albuterol (VENTOLIN HFA) 108 (90 Base) MCG/ACT inhaler  Inhale 1-2 puffs into the lungs every 6 (six) hours as needed for wheezing or shortness of breath.     aspirin EC 325 MG EC tablet Take 1 tablet (325 mg total) by mouth daily. 30 tablet 0   atorvastatin (LIPITOR) 20 MG tablet TAKE 1 TABLET BY MOUTH DAILY 100 tablet 0   buPROPion (WELLBUTRIN XL) 150 MG 24 hr tablet Take 1 tablet (150 mg total) by mouth daily. 7 tablet 0   carbidopa-levodopa (SINEMET CR) 50-200 MG tablet TAKE 1 TABLET BY MOUTH EVERY  NIGHT AT BEDTIME 90 tablet 0   carbidopa-levodopa (SINEMET IR) 25-100 MG tablet TAKE 2  TABLETS BY MOUTH 3 TIMES  DAILY AT 9 AM, 1 PM, AND 5 PM 600 tablet 1   carvedilol (COREG) 25 MG tablet Take 0.5 tablets (12.5 mg total) by mouth in the morning and at bedtime. 180 tablet 2   celecoxib (CELEBREX) 200 MG capsule TAKE 1 CAPSULE BY MOUTH DAILY 100 capsule 2   clonazePAM (KLONOPIN) 1 MG tablet TAKE 1 TABLET BY MOUTH AT BEDTIME AND 1 DAILY AS NEEDED FOR ANXIETY 45 tablet 1   Cyanocobalamin 1000 MCG SUBL Place 1,000 mcg under the tongue daily.     fluticasone (FLONASE) 50 MCG/ACT nasal spray Place 1 spray into both nostrils 3 times/day as needed-between meals & bedtime. 48 g 3   HYDROcodone-acetaminophen (NORCO/VICODIN) 5-325 MG tablet Take 1 tablet by mouth every 8 (eight) hours as needed.     KRILL OIL PO Take 1 tablet by mouth daily.     montelukast (SINGULAIR) 10 MG tablet TAKE 1 TABLET BY MOUTH AT  BEDTIME 100 tablet 2   rOPINIRole (REQUIP) 1 MG tablet TAKE 1 tablet at 9am, 1 at 1 pm and 1/2 tablet at 5pm. 270 tablet 0   sertraline (ZOLOFT) 100 MG tablet Take 1 tablet (100 mg total) by mouth at bedtime. 90 tablet 0   sertraline (ZOLOFT) 100 MG tablet Take 1.5 tablets (150 mg total) by mouth daily. 135 tablet 0   tamsulosin (FLOMAX) 0.4 MG CAPS capsule TAKE 2 CAPSULES BY MOUTH DAILY  AFTER SUPPER 200 capsule 0   Vitamin D, Ergocalciferol, (DRISDOL) 1.25 MG (50000 UNIT) CAPS capsule TAKE 1 CAPSULE BY MOUTH EVERY  MONDAY 15 capsule 2   Zinc 50 MG TABS Take  50 mg by mouth at bedtime.     No facility-administered medications prior to visit.    No Known Allergies  Review of Systems  Constitutional:  Negative for chills, fatigue and fever.  HENT:  Positive for congestion, sinus pressure, sinus pain and sore throat. Negative for ear pain, postnasal drip and rhinorrhea.   Respiratory:  Positive for cough. Negative for shortness of breath.   Cardiovascular:  Negative for chest pain.  Gastrointestinal:  Negative for diarrhea, nausea and vomiting.  Neurological:  Negative for dizziness and headaches.       Objective:        06/26/2023    3:04 PM 06/02/2023    3:00 PM 04/14/2023    8:25 AM  Vitals with BMI  Height 6\' 2"   6\' 2"   Weight 182 lbs  188 lbs 6 oz  BMI 23.36  24.18  Systolic 128  110  Diastolic 72  74  Pulse 72  70     Information is confidential and restricted. Go to Review Flowsheets to unlock data.    No data found.   Physical Exam Vitals reviewed.  Constitutional:      Appearance: Normal appearance. He is well-developed.  HENT:     Right Ear: Tympanic membrane, ear canal and external ear normal.     Left Ear: Tympanic membrane, ear canal and external ear normal.     Nose: Congestion present. No rhinorrhea.     Comments: BL sinus tenderness    Mouth/Throat:     Mouth: Mucous membranes are moist.     Pharynx: Posterior oropharyngeal erythema present. No oropharyngeal exudate.  Cardiovascular:     Rate and Rhythm: Normal rate and regular rhythm.     Heart sounds: Normal heart sounds.  Pulmonary:     Effort: Pulmonary effort is normal. No respiratory  distress.     Breath sounds: Normal breath sounds. No wheezing, rhonchi or rales.  Abdominal:     General: Bowel sounds are normal.     Palpations: Abdomen is soft.     Tenderness: There is no abdominal tenderness.  Lymphadenopathy:     Cervical: No cervical adenopathy.  Neurological:     Mental Status: He is alert.  Psychiatric:        Mood and Affect: Mood  normal.        Behavior: Behavior normal.     Health Maintenance Due  Topic Date Due   DTaP/Tdap/Td (1 - Tdap) Never done   COVID-19 Vaccine (6 - 2023-24 season) 05/04/2023   Medicare Annual Wellness (AWV)  08/21/2023    There are no preventive care reminders to display for this patient.   Lab Results  Component Value Date   TSH 1.690 01/24/2023   Lab Results  Component Value Date   WBC 8.3 01/24/2023   HGB 13.7 01/24/2023   HCT 40.4 01/24/2023   MCV 105 (H) 01/24/2023   PLT 161 01/24/2023   Lab Results  Component Value Date   NA 142 01/24/2023   K 5.0 01/24/2023   CO2 25 01/24/2023   GLUCOSE 84 01/24/2023   BUN 18 01/24/2023   CREATININE 0.87 01/24/2023   BILITOT 0.6 01/24/2023   ALKPHOS 73 01/24/2023   AST 17 01/24/2023   ALT 5 01/24/2023   PROT 6.4 01/24/2023   ALBUMIN 4.0 01/24/2023   CALCIUM 9.4 01/24/2023   ANIONGAP 11 01/22/2021   EGFR 91 01/24/2023   Lab Results  Component Value Date   CHOL 106 01/24/2023   Lab Results  Component Value Date   HDL 52 01/24/2023   Lab Results  Component Value Date   LDLCALC 41 01/24/2023   Lab Results  Component Value Date   TRIG 58 01/24/2023   Lab Results  Component Value Date   CHOLHDL 2.0 01/24/2023   No results found for: "HGBA1C"     Assessment & Plan:  Acute non-recurrent maxillary sinusitis Assessment & Plan: - will treat with amoxicillin x 10 days - discussed symptomatic management (flonase, decongestants, etc), natural course, and return precautions     Sore throat Assessment & Plan: Recommend gargle warm salt water.  Drink hot tea with lemon and honey.   Orders: -     POC COVID-19 BinaxNow -     POCT Influenza A/B  Other orders -     Amoxicillin; Take 1 tablet (875 mg total) by mouth 2 (two) times daily for 10 days.  Dispense: 20 tablet; Refill: 0     Meds ordered this encounter  Medications   amoxicillin (AMOXIL) 875 MG tablet    Sig: Take 1 tablet (875 mg total) by mouth 2  (two) times daily for 10 days.    Dispense:  20 tablet    Refill:  0    Orders Placed This Encounter  Procedures   POC COVID-19 BinaxNow   POCT Influenza A/B     Follow-up: No follow-ups on file.  An After Visit Summary was printed and given to the patient.  Blane Ohara, MD [facility] [phone]

## 2023-06-26 ENCOUNTER — Ambulatory Visit: Payer: Medicare Other | Admitting: Family Medicine

## 2023-06-26 VITALS — BP 128/72 | HR 72 | Temp 97.5°F | Ht 74.0 in | Wt 182.0 lb

## 2023-06-26 DIAGNOSIS — J0100 Acute maxillary sinusitis, unspecified: Secondary | ICD-10-CM

## 2023-06-26 DIAGNOSIS — J029 Acute pharyngitis, unspecified: Secondary | ICD-10-CM

## 2023-06-26 LAB — POCT INFLUENZA A/B
Influenza A, POC: NEGATIVE
Influenza B, POC: NEGATIVE

## 2023-06-26 LAB — POC COVID19 BINAXNOW: SARS Coronavirus 2 Ag: NEGATIVE

## 2023-06-26 MED ORDER — AMOXICILLIN 875 MG PO TABS: 875.0000 mg | ORAL_TABLET | Freq: Two times a day (BID) | ORAL | 0 refills | Status: AC

## 2023-06-26 NOTE — Patient Instructions (Signed)
Recommend gargle warm salt water.  Drink hot tea with lemon and honey.   - will treat with amoxicillin x 10 days - discussed symptomatic management (flonase, decongestants, etc), natural course, and return precautions

## 2023-06-28 ENCOUNTER — Other Ambulatory Visit: Payer: Self-pay | Admitting: Neurology

## 2023-06-28 DIAGNOSIS — G20A1 Parkinson's disease without dyskinesia, without mention of fluctuations: Secondary | ICD-10-CM

## 2023-06-29 ENCOUNTER — Encounter: Payer: Self-pay | Admitting: Family Medicine

## 2023-06-29 DIAGNOSIS — J0100 Acute maxillary sinusitis, unspecified: Secondary | ICD-10-CM | POA: Insufficient documentation

## 2023-06-29 DIAGNOSIS — J029 Acute pharyngitis, unspecified: Secondary | ICD-10-CM | POA: Insufficient documentation

## 2023-06-29 HISTORY — DX: Acute maxillary sinusitis, unspecified: J01.00

## 2023-06-29 NOTE — Assessment & Plan Note (Signed)
Recommend gargle warm salt water.  Drink hot tea with lemon and honey.

## 2023-06-29 NOTE — Assessment & Plan Note (Signed)
-   will treat with amoxicillin x 10 days - discussed symptomatic management (flonase, decongestants, etc), natural course, and return precautions

## 2023-06-30 ENCOUNTER — Other Ambulatory Visit: Payer: Self-pay | Admitting: Family Medicine

## 2023-06-30 NOTE — Assessment & Plan Note (Signed)
Continue coreg 25 mg twice daily and spironolactone 25 mg daily.  Management per specialist.

## 2023-06-30 NOTE — Progress Notes (Unsigned)
Subjective:  Patient ID: Travis Reese, male    DOB: [birth year]  Age: 77 y.o. MRN: [id number]  No chief complaint on file.   HPI   Parkinson's disease:  Current Medications: Sinemet IR 25-100mg  take 2 tablets TID, Sinemet CR 50-200 1 tablet at bedtime. Fairly stable.  Hyperlipidemia: Taking Atorvastatin 20 mg daily, Krill oil.  BPH: Current medications: Tamsulosin in evening.  HYPERTENSION: Coreg 25 mg TWICE DAILY, Spironolactone 25 mg daily Back pain: Hydrocodone takes as needed B12 deficiency: Taking b12 gummies daily.      06/02/2023    3:37 PM 01/24/2023    9:52 AM 09/30/2022   12:10 PM 08/20/2022    4:00 PM 08/05/2022    5:39 PM  Depression screen PHQ 2/9  Decreased Interest  3  3   Down, Depressed, Hopeless  2  2   PHQ - 2 Score  5  5   Altered sleeping  0  0   Tired, decreased energy  3  3   Change in appetite  0  0   Feeling bad or failure about yourself   3  2   Trouble concentrating  0  1   Moving slowly or fidgety/restless  0  0   Suicidal thoughts  0  0   PHQ-9 Score  11  11   Difficult doing work/chores  Not difficult at all  Somewhat difficult      Information is confidential and restricted. Go to Review Flowsheets to unlock data.        04/14/2023    8:27 AM  Fall Risk   Falls in the past year? 0  Number falls in past yr: 0  Injury with Fall? 0  Follow up Falls evaluation completed    Patient Care Team: Blane Ohara, MD as PCP - General (Internal Medicine) Tat, Octaviano Batty, DO as Consulting Physician (Neurology) Georgeanna Lea, MD as Consulting Physician (Cardiology) Patricia Nettle, MD (Orthopedic Surgery) Zettie Pho, [facility] (Inactive) (Pharmacist)   Review of Systems  Current Outpatient Medications on File Prior to Visit  Medication Sig Dispense Refill   acetaminophen (TYLENOL) 325 MG tablet Take 2 tablets (650 mg total) by mouth every 6 (six) hours as needed for mild pain (or Fever >/= 101).     albuterol (VENTOLIN HFA) 108 (90 Base)  MCG/ACT inhaler Inhale 1-2 puffs into the lungs every 6 (six) hours as needed for wheezing or shortness of breath.     amoxicillin (AMOXIL) 875 MG tablet Take 1 tablet (875 mg total) by mouth 2 (two) times daily for 10 days. 20 tablet 0   aspirin EC 325 MG EC tablet Take 1 tablet (325 mg total) by mouth daily. 30 tablet 0   atorvastatin (LIPITOR) 20 MG tablet TAKE 1 TABLET BY MOUTH ONCE  DAILY 100 tablet 0   buPROPion (WELLBUTRIN XL) 150 MG 24 hr tablet Take 1 tablet (150 mg total) by mouth daily. 7 tablet 0   carbidopa-levodopa (SINEMET CR) 50-200 MG tablet TAKE 1 TABLET BY MOUTH EVERY  NIGHT AT BEDTIME 90 tablet 0   carbidopa-levodopa (SINEMET IR) 25-100 MG tablet TAKE 2 TABLETS BY MOUTH 3 TIMES  DAILY AT 9 AM, 1 PM, AND 5 PM 600 tablet 1   carvedilol (COREG) 25 MG tablet Take 0.5 tablets (12.5 mg total) by mouth in the morning and at bedtime. 180 tablet 2   celecoxib (CELEBREX) 200 MG capsule TAKE 1 CAPSULE BY MOUTH DAILY 100 capsule 2  clonazePAM (KLONOPIN) 1 MG tablet TAKE 1 TABLET BY MOUTH AT BEDTIME AND 1 DAILY AS NEEDED FOR ANXIETY 45 tablet 1   Cyanocobalamin 1000 MCG SUBL Place 1,000 mcg under the tongue daily.     fluticasone (FLONASE) 50 MCG/ACT nasal spray Place 1 spray into both nostrils 3 times/day as needed-between meals & bedtime. 48 g 3   HYDROcodone-acetaminophen (NORCO/VICODIN) 5-325 MG tablet Take 1 tablet by mouth every 8 (eight) hours as needed.     KRILL OIL PO Take 1 tablet by mouth daily.     montelukast (SINGULAIR) 10 MG tablet TAKE 1 TABLET BY MOUTH AT  BEDTIME 100 tablet 2   rOPINIRole (REQUIP) 1 MG tablet TAKE 1 TABLET BY MOUTH AT 9 AM,  1 TABLET AT 1 PM AND 1/2 TABLET  AT 5 PM 250 tablet 0   sertraline (ZOLOFT) 100 MG tablet Take 1 tablet (100 mg total) by mouth at bedtime. 90 tablet 0   sertraline (ZOLOFT) 100 MG tablet Take 1.5 tablets (150 mg total) by mouth daily. 135 tablet 0   tamsulosin (FLOMAX) 0.4 MG CAPS capsule TAKE 2 CAPSULES BY MOUTH DAILY  AFTER SUPPER  200 capsule 0   Vitamin D, Ergocalciferol, (DRISDOL) 1.25 MG (50000 UNIT) CAPS capsule TAKE 1 CAPSULE BY MOUTH EVERY  MONDAY 15 capsule 2   Zinc 50 MG TABS Take 50 mg by mouth at bedtime.     No current facility-administered medications on file prior to visit.   Past Medical History:  Diagnosis Date   Allergic rhinosinusitis    Anxiety    Aortic valve sclerosis    Arthritis    knees, neck, shoulder   Carotid artery stenosis 2022   Mild   CHF (congestive heart failure) (HCC)    Cubital tunnel syndrome    DDD (degenerative disc disease), cervical    Depression    Elevated PSA    denies per patient   HTN (hypertension)    Hyperlipidemia    Hyperplasia of prostate with lower urinary tract symptoms (LUTS)    Hypertension    Mild dilation of ascending aorta (HCC)    Parkinson disease (HCC)    Prolapsed internal hemorrhoids, grade 3 04/19/2015   Rectal prolapse    Scoliosis of lumbosacral spine    Stroke (HCC)    x2   Tremor    Vitamin D deficiency    Wears hearing aid    bilateral   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   BENTALL PROCEDURE N/A 05/01/2018   Procedure: BIOLOGICAL BENTALL PROCEDURE AORTIC ROOT REPLACEMENT WITH VALSALVA GRAFT & INSPIRIS VALVE.;  Surgeon: Purcell Nails, MD;  Location: [facility] OR;  Service: Open Heart Surgery;  Laterality: N/A;   BUBBLE STUDY  04/13/2021   Procedure: BUBBLE STUDY;  Surgeon: Jodelle Red, MD;  Location: [facility] ENDOSCOPY;  Service: Cardiovascular;;   CATARACT EXTRACTION Bilateral 04/2018   COLONOSCOPY  2007   NASAL SINUS SURGERY  2006   RIGHT/LEFT HEART CATH AND CORONARY ANGIOGRAPHY N/A 04/27/2018   Procedure: RIGHT/LEFT HEART CATH AND CORONARY ANGIOGRAPHY;  Surgeon: Dolores Patty, MD;  Location: [facility] INVASIVE CV LAB;  Service: Cardiovascular;  Laterality: N/A;   TEE WITHOUT CARDIOVERSION N/A 04/27/2018   Procedure: TRANSESOPHAGEAL ECHOCARDIOGRAM (TEE);  Surgeon:  Dolores Patty, MD;  Location: [facility] ENDOSCOPY;  Service: Cardiovascular;  Laterality: N/A;   TEE WITHOUT CARDIOVERSION N/A 05/01/2018   Procedure: TRANSESOPHAGEAL ECHOCARDIOGRAM (TEE);  Surgeon: Purcell Nails, MD;  Location: [facility] OR;  Service: Open Heart Surgery;  Laterality: N/A;   TEE WITHOUT CARDIOVERSION N/A 04/13/2021   Procedure: TRANSESOPHAGEAL ECHOCARDIOGRAM (TEE);  Surgeon: Jodelle Red, MD;  Location: [facility] ENDOSCOPY;  Service: Cardiovascular;  Laterality: N/A;   TRANSANAL HEMORRHOIDAL DEARTERIALIZATION N/A 11/15/2015   Procedure: TRANSANAL HEMORRHOIDAL DEARTERIALIZATION;  Surgeon: Romie Levee, MD;  Location: [facility] ;  Service: General;  Laterality: N/A;    Family History  Problem Relation Age of Onset   COPD Mother    Heart attack Mother    Colon polyps Father    Prostate cancer Father    Parkinson's disease Father    Hyperlipidemia Father    Depression Sister    Alcohol abuse Sister    Hypertension Sister    Breast cancer Sister    Hypertension Brother    Prostate cancer Paternal Uncle    Social History   Socioeconomic History   Marital status: Single    Spouse name: Not on file   Number of children: 2   Years of education: Not on file   Highest education level: 12th grade  Occupational History   Occupation: retired    Comment: security guard  Tobacco Use   Smoking status: Every Day    Current packs/day: 0.15    Average packs/day: 0.2 packs/day for 50.0 years (7.5 ttl pk-yrs)    Types: Cigarettes   Smokeless tobacco: Never   Tobacco comments:    Patient has cut down to three cigarettes a day  Vaping Use   Vaping status: Former  Substance and Sexual Activity   Alcohol use: Not Currently    Comment: Occasional   Drug use: No   Sexual activity: Not Currently  Other Topics Concern   Not on file  Social History Narrative   1 son, 1 daughter   Agricultural consultant work   3 caffeine/day   Right handed   Social Determinants of Health    Financial Resource Strain: Low Risk  (01/23/2023)   Overall Financial Resource Strain (CARDIA)    Difficulty of Paying Living Expenses: Not very hard  Food Insecurity: No Food Insecurity (01/23/2023)   Hunger Vital Sign    Worried About Running Out of Food in the Last Year: Never true    Ran Out of Food in the Last Year: Never true  Transportation Needs: No Transportation Needs (01/23/2023)   PRAPARE - Administrator, Civil Service (Medical): No    Lack of Transportation (Non-Medical): No  Physical Activity: Inactive (01/23/2023)   Exercise Vital Sign    Days of Exercise per Week: 0 days    Minutes of Exercise per Session: 20 min  Stress: Stress Concern Present (01/23/2023)   [employer] of Occupational Health - Occupational Stress Questionnaire    Feeling of Stress : Rather much  Social Connections: Unknown (01/23/2023)   Social Connection and Isolation Panel [NHANES]    Frequency of Communication with Friends and Family: More than three times a week    Frequency of Social Gatherings with Friends and Family: Twice a week    Attends Religious Services: Patient declined    Database administrator or Organizations: Patient declined    Attends Banker Meetings: Never    Marital Status: Living with partner    Objective:  There were no vitals taken for this visit.     06/26/2023    3:04 PM 06/02/2023    3:00 PM 04/14/2023    8:25 AM  BP/Weight  Systolic BP  128  110  Diastolic BP 72  74  Wt. (Lbs) 182  188.4  BMI 23.37 kg/m2  24.19 kg/m2     Information is confidential and restricted. Go to Review Flowsheets to unlock data.    Physical Exam  Diabetic Foot Exam - Simple   No data filed      Lab Results  Component Value Date   WBC 8.3 01/24/2023   HGB 13.7 01/24/2023   HCT 40.4 01/24/2023   PLT 161 01/24/2023   GLUCOSE 84 01/24/2023   CHOL 106 01/24/2023   TRIG 58 01/24/2023   HDL 52 01/24/2023   LDLCALC 41 01/24/2023   ALT 5 01/24/2023    AST 17 01/24/2023   NA 142 01/24/2023   K 5.0 01/24/2023   CL 103 01/24/2023   CREATININE 0.87 01/24/2023   BUN 18 01/24/2023   CO2 25 01/24/2023   TSH 1.690 01/24/2023   INR 1.58 05/01/2018      Assessment & Plan:    Hypertensive heart disease with chronic diastolic congestive heart failure (HCC) Assessment & Plan: Well controlled.  No changes to medicines. Continue Coreg 25 mg TWICE DAILY, Spironolactone 25 mg daily Continue to work on eating a healthy diet and exercise.  Labs drawn today.     Chronic diastolic CHF (congestive heart failure) (HCC) Assessment & Plan: Continue coreg 25 mg twice daily and spironolactone 25 mg daily.  Management per specialist.     Parkinson's disease without dyskinesia, with fluctuating manifestations (HCC) Assessment & Plan: The current medical regimen is effective;  continue present plan and medications.    Mixed hyperlipidemia Assessment & Plan: Well controlled.  No changes to medicines. Continue atorvastatin 20 mg daily and krill oil. Continue to work on eating a healthy diet and exercise.         No orders of the defined types were placed in this encounter.   No orders of the defined types were placed in this encounter.    Follow-up: No follow-ups on file.   I,Jarrette Dehner I Leal-Borjas,acting as a scribe for Blane Ohara, MD.,have documented all relevant documentation on the behalf of Blane Ohara, MD,as directed by  Blane Ohara, MD while in the presence of Blane Ohara, MD.   An After Visit Summary was printed and given to the patient.  Blane Ohara, MD [facility] [phone]

## 2023-06-30 NOTE — Assessment & Plan Note (Signed)
The current medical regimen is effective;  continue present plan and medications.  

## 2023-06-30 NOTE — Assessment & Plan Note (Signed)
Well controlled.  No changes to medicines. Continue Coreg 25 mg TWICE DAILY, Spironolactone 25 mg daily Continue to work on eating a healthy diet and exercise.  Labs drawn today.

## 2023-06-30 NOTE — Assessment & Plan Note (Signed)
Well controlled.  No changes to medicines. Continue atorvastatin 20 mg daily and krill oil. Continue to work on eating a healthy diet and exercise.

## 2023-07-01 ENCOUNTER — Encounter: Payer: Medicare Other | Admitting: Family Medicine

## 2023-07-01 DIAGNOSIS — I5032 Chronic diastolic (congestive) heart failure: Secondary | ICD-10-CM

## 2023-07-01 DIAGNOSIS — I110 Hypertensive heart disease with heart failure: Secondary | ICD-10-CM

## 2023-07-01 DIAGNOSIS — E782 Mixed hyperlipidemia: Secondary | ICD-10-CM

## 2023-07-01 DIAGNOSIS — G20A2 Parkinson's disease without dyskinesia, with fluctuations: Secondary | ICD-10-CM

## 2023-07-02 NOTE — Progress Notes (Signed)
This encounter was created in error - please disregard.

## 2023-07-03 ENCOUNTER — Ambulatory Visit: Payer: Medicare Other

## 2023-07-04 ENCOUNTER — Telehealth: Payer: Self-pay | Admitting: Neurology

## 2023-07-04 NOTE — Telephone Encounter (Signed)
Pateint will take the virtual appointment on Tuesday and is scheduled for that. He is calling his PCP to discuss orthostatic  due to carvedilol and he is going to reduse requip to 0.5 TID until he stops on Tues
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
## 2023-07-08 ENCOUNTER — Encounter: Payer: Self-pay | Admitting: Neurology

## 2023-07-08 ENCOUNTER — Telehealth: Payer: Medicare Other | Admitting: Neurology

## 2023-07-08 DIAGNOSIS — R55 Syncope and collapse: Secondary | ICD-10-CM | POA: Diagnosis not present

## 2023-07-08 DIAGNOSIS — K117 Disturbances of salivary secretion: Secondary | ICD-10-CM

## 2023-07-08 DIAGNOSIS — G20A1 Parkinson's disease without dyskinesia, without mention of fluctuations: Secondary | ICD-10-CM | POA: Diagnosis not present

## 2023-07-15 ENCOUNTER — Ambulatory Visit: Payer: Medicare Other

## 2023-07-18 ENCOUNTER — Ambulatory Visit (INDEPENDENT_AMBULATORY_CARE_PROVIDER_SITE_OTHER): Payer: Medicare Other

## 2023-07-18 VITALS — BP 136/84 | HR 77 | Temp 98.7°F | Resp 16 | Ht 74.0 in | Wt 179.4 lb

## 2023-07-18 DIAGNOSIS — I10 Essential (primary) hypertension: Secondary | ICD-10-CM | POA: Diagnosis not present

## 2023-07-18 DIAGNOSIS — J3089 Other allergic rhinitis: Secondary | ICD-10-CM

## 2023-07-18 DIAGNOSIS — Z0000 Encounter for general adult medical examination without abnormal findings: Secondary | ICD-10-CM | POA: Insufficient documentation

## 2023-07-18 DIAGNOSIS — R350 Frequency of micturition: Secondary | ICD-10-CM | POA: Diagnosis not present

## 2023-07-18 DIAGNOSIS — N401 Enlarged prostate with lower urinary tract symptoms: Secondary | ICD-10-CM

## 2023-07-18 DIAGNOSIS — G20A2 Parkinson's disease without dyskinesia, with fluctuations: Secondary | ICD-10-CM

## 2023-07-18 DIAGNOSIS — Z23 Encounter for immunization: Secondary | ICD-10-CM | POA: Diagnosis not present

## 2023-07-18 DIAGNOSIS — R55 Syncope and collapse: Secondary | ICD-10-CM | POA: Insufficient documentation

## 2023-07-18 DIAGNOSIS — F319 Bipolar disorder, unspecified: Secondary | ICD-10-CM

## 2023-07-18 HISTORY — DX: Syncope and collapse: R55

## 2023-07-18 HISTORY — DX: Encounter for general adult medical examination without abnormal findings: Z00.00

## 2023-07-18 NOTE — Assessment & Plan Note (Signed)
Diagnosed 1-2 years ago. Symptoms include tremor, occasional leg weakness, and orthostatic hypotension. Current medications include Sinemet and clonazepam. Discussed hydration and positional changes to manage orthostatic hypotension. - Continue Sinemet CR 50-200 mg at night daily and Sinemet IR 25-100 mg, 2 tabs TID - Stay well hydrated - Take time changing positions - Follow up with neurologist

## 2023-07-18 NOTE — Assessment & Plan Note (Signed)
General Health Maintenance Routine health maintenance including vaccinations and screenings. - Administer flu shot - Administer COVID vaccine - Recommend tetanus shot at pharmacy  Follow-up - Follow up with Dr. Sedalia Muta in two months - Consider cardiology referral for further evaluation of syncope if needed - Send letter to [facility] regarding driving restrictions - Review blood work results when available.

## 2023-07-18 NOTE — Assessment & Plan Note (Signed)
Reports feeling anxious and depressed. Currently on sertraline 100 mg twice daily and klonopin.  Recent dose increase by psychiatrist on the zoloft to 100 mg twice daily - Continue sertraline 100 mg twice daily - Consider psychiatric follow-up if symptoms persist

## 2023-07-18 NOTE — Assessment & Plan Note (Signed)
Reports sinus congestion, post-nasal drip, and headaches. Symptoms worsened in the last month. Previous treatment for sinus infection was ineffective. Discussed non-prescription options like Flonase which he already takes, steam inhalation, and nasal saline spray. Advised against regular use of decongestants like Sudafed and Benadryl due to potential drowsiness and risk of exacerbating syncope. - Continue Flonase nasal spray - Use steam inhalation - Consider nasal saline spray or drops - Avoid regular use of decongestants like Sudafed and Benadryl, could use them occasionally
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
## 2023-07-18 NOTE — Patient Instructions (Addendum)
Blood work today PLEASE DO NOT DRIVE UNTIL FURTHER RECOMMENDATION Flu shot today Cut back on klonopin further Wean off Requip  Could refer you to cardiology. Let me know.  Follow up with Dr.Cox in 2 months

## 2023-07-18 NOTE — Assessment & Plan Note (Signed)
Intermittent stop and start urination, nocturia, and dark urine. No new symptoms suggestive of infection. - Continue Flomax 0.8 mg

## 2023-07-19 LAB — CBC WITH DIFFERENTIAL/PLATELET
Basophils Absolute: 0 10*3/uL (ref 0.0–0.2)
Basos: 0 %
EOS (ABSOLUTE): 0.2 10*3/uL (ref 0.0–0.4)
Eos: 2 %
Hematocrit: 42.3 % (ref 37.5–51.0)
Hemoglobin: 14 g/dL (ref 13.0–17.7)
Immature Grans (Abs): 0 10*3/uL (ref 0.0–0.1)
Immature Granulocytes: 0 %
Lymphocytes Absolute: 1.9 10*3/uL (ref 0.7–3.1)
Lymphs: 18 %
MCH: 34.7 pg — ABNORMAL HIGH (ref 26.6–33.0)
MCHC: 33.1 g/dL (ref 31.5–35.7)
MCV: 105 fL — ABNORMAL HIGH (ref 79–97)
Monocytes Absolute: 0.8 10*3/uL (ref 0.1–0.9)
Monocytes: 8 %
Neutrophils Absolute: 7.3 10*3/uL — ABNORMAL HIGH (ref 1.4–7.0)
Neutrophils: 72 %
Platelets: 209 10*3/uL (ref 150–450)
RBC: 4.03 x10E6/uL — ABNORMAL LOW (ref 4.14–5.80)
RDW: 12 % (ref 11.6–15.4)
WBC: 10.2 10*3/uL (ref 3.4–10.8)

## 2023-07-19 LAB — COMPREHENSIVE METABOLIC PANEL
ALT: 9 [IU]/L (ref 0–44)
AST: 42 [IU]/L — ABNORMAL HIGH (ref 0–40)
Albumin: 4.2 g/dL (ref 3.8–4.8)
Alkaline Phosphatase: 97 [IU]/L (ref 44–121)
BUN/Creatinine Ratio: 16 (ref 10–24)
BUN: 12 mg/dL (ref 8–27)
Bilirubin Total: 0.7 mg/dL (ref 0.0–1.2)
CO2: 25 mmol/L (ref 20–29)
Calcium: 9.4 mg/dL (ref 8.6–10.2)
Chloride: 101 mmol/L (ref 96–106)
Creatinine, Ser: 0.74 mg/dL — ABNORMAL LOW (ref 0.76–1.27)
Globulin, Total: 2.6 g/dL (ref 1.5–4.5)
Glucose: 92 mg/dL (ref 70–99)
Potassium: 4.1 mmol/L (ref 3.5–5.2)
Sodium: 143 mmol/L (ref 134–144)
Total Protein: 6.8 g/dL (ref 6.0–8.5)
eGFR: 95 mL/min/{1.73_m2} (ref >59)

## 2023-07-19 LAB — PSA: Prostate Specific Ag, Serum: 2 ng/mL (ref 0.0–4.0)

## 2023-07-19 LAB — LIPID PANEL
Chol/HDL Ratio: 2 ratio (ref 0.0–5.0)
Cholesterol, Total: 106 mg/dL (ref 100–199)
HDL: 54 mg/dL (ref >39)
LDL Chol Calc (NIH): 36 mg/dL (ref 0–99)
Triglycerides: 76 mg/dL (ref 0–149)
VLDL Cholesterol Cal: 16 mg/dL (ref 5–40)

## 2023-07-19 LAB — TSH: TSH: 1.16 u[IU]/mL (ref 0.450–4.500)

## 2023-07-19 LAB — B12 AND FOLATE PANEL
Folate: 15.6 ng/mL (ref >3.0)
Vitamin B-12: >2000 pg/mL — ABNORMAL HIGH (ref 232–1245)

## 2023-07-22 ENCOUNTER — Ambulatory Visit: Payer: Medicare Other | Admitting: Psychiatry
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
## 2023-07-30 ENCOUNTER — Encounter: Payer: Self-pay | Admitting: Psychiatry

## 2023-07-30 ENCOUNTER — Telehealth: Payer: Medicare Other | Admitting: Psychiatry

## 2023-07-30 ENCOUNTER — Telehealth: Payer: Self-pay

## 2023-07-30 DIAGNOSIS — F419 Anxiety disorder, unspecified: Secondary | ICD-10-CM

## 2023-07-30 DIAGNOSIS — G4710 Hypersomnia, unspecified: Secondary | ICD-10-CM

## 2023-07-30 DIAGNOSIS — G4700 Insomnia, unspecified: Secondary | ICD-10-CM | POA: Diagnosis not present

## 2023-07-30 DIAGNOSIS — F3341 Major depressive disorder, recurrent, in partial remission: Secondary | ICD-10-CM

## 2023-07-30 MED ORDER — TRAZODONE HCL 50 MG PO TABS: 25.0000 mg | ORAL_TABLET | Freq: Every day | ORAL | 1 refills | Status: DC

## 2023-07-30 MED ORDER — SERTRALINE HCL 100 MG PO TABS: 150.0000 mg | ORAL_TABLET | Freq: Every day | ORAL | 0 refills | Status: DC

## 2023-07-30 NOTE — Patient Instructions (Signed)
Continue sertraline 150 mg at night  Continue bupropion 150 mg daily - he declines a refill  Decrease calonzepaine 0.5 mg at night for one week, then discontinue Start trazodone 25-50 mg at night as needed for insomnia, after discontinuation of clonazepam  Next appointment: 11/19 at 2 30

## 2023-07-30 NOTE — Telephone Encounter (Signed)
Per Dr. Sedalia Muta to ask for an extension on the [facility] paper work. I called the number listed on the paper for the [facility] and I was told the Mr. Ziomek would have to call. I contacted the patient and left a detail message with the phone number [phone] to contact the DM[employer] to request an extension on the paperwork. It is due within 30 days. On the paper work it is dated 07/02/2023.

## 2023-08-02 ENCOUNTER — Other Ambulatory Visit: Payer: Self-pay | Admitting: Psychiatry

## 2023-08-02 ENCOUNTER — Ambulatory Visit: Payer: Medicare Other | Attending: Family Medicine

## 2023-08-02 ENCOUNTER — Other Ambulatory Visit: Payer: Self-pay | Admitting: Family Medicine

## 2023-08-02 DIAGNOSIS — R55 Syncope and collapse: Secondary | ICD-10-CM

## 2023-08-02 DIAGNOSIS — G4710 Hypersomnia, unspecified: Secondary | ICD-10-CM

## 2023-08-02 DIAGNOSIS — G4700 Insomnia, unspecified: Secondary | ICD-10-CM

## 2023-08-04 ENCOUNTER — Other Ambulatory Visit: Payer: Self-pay | Admitting: Family Medicine

## 2023-08-05 ENCOUNTER — Ambulatory Visit (INDEPENDENT_AMBULATORY_CARE_PROVIDER_SITE_OTHER): Payer: Medicare Other

## 2023-08-05 VITALS — Ht 74.0 in | Wt 178.0 lb

## 2023-08-05 DIAGNOSIS — Z0000 Encounter for general adult medical examination without abnormal findings: Secondary | ICD-10-CM

## 2023-08-05 NOTE — Progress Notes (Signed)
Subjective:   Travis Reese is a 77 y.o. male who presents for Medicare Annual/Subsequent preventive examination.  Visit Complete: Virtual I connected with  Travis Reese on 08/05/23 by a audio enabled telemedicine application and verified that I am speaking with the correct person using two identifiers.  Patient Location: Home  Provider Location: Home Office  I discussed the limitations of evaluation and management by telemedicine. The patient expressed understanding and agreed to proceed.  Vital Signs: Because this visit was a virtual/telehealth visit, some criteria may be missing or patient reported. Any vitals not documented were not able to be obtained and vitals that have been documented are patient reported.  Cardiac Risk Factors include: advanced age (>99men, >65 women);dyslipidemia;family history of premature cardiovascular disease;hypertension;male gender;sedentary lifestyle     Objective:    Today's Vitals   08/05/23 1131  Weight: 178 lb (80.7 kg)  Height: 6\' 2"  (1.88 m)  PainSc: 0-No pain   Body mass index is 22.85 kg/m.     08/05/2023   11:33 AM 04/14/2023    8:27 AM 03/04/2023    2:10 PM 10/31/2022   11:44 AM 08/20/2022    3:59 PM 06/06/2022    2:55 PM 11/29/2021    8:52 AM  Advanced Directives  Does Patient Have a Medical Advance Directive? No Yes Yes Yes No Yes No  Type of Advance Directive  Living will Living will Living will     Would patient like information on creating a medical advance directive? No - Patient declined    Yes (MAU/Ambulatory/Procedural Areas - Information given)      Current Medications (verified) Outpatient Encounter Medications as of 08/05/2023  Medication Sig   acetaminophen (TYLENOL) 325 MG tablet Take 2 tablets (650 mg total) by mouth every 6 (six) hours as needed for mild pain (or Fever >/= 101).   albuterol (VENTOLIN HFA) 108 (90 Base) MCG/ACT inhaler Inhale 1-2 puffs into the lungs every 6 (six) hours as needed for wheezing or  shortness of breath.   aspirin EC 325 MG EC tablet Take 1 tablet (325 mg total) by mouth daily.   atorvastatin (LIPITOR) 20 MG tablet TAKE 1 TABLET BY MOUTH ONCE  DAILY   buPROPion (WELLBUTRIN XL) 150 MG 24 hr tablet Take 1 tablet (150 mg total) by mouth daily.   carbidopa-levodopa (SINEMET CR) 50-200 MG tablet TAKE 1 TABLET BY MOUTH EVERY  NIGHT AT BEDTIME   carbidopa-levodopa (SINEMET IR) 25-100 MG tablet TAKE 2 TABLETS BY MOUTH 3 TIMES  DAILY AT 9 AM, 1 PM, AND 5 PM   carvedilol (COREG) 25 MG tablet Take 0.5 tablets (12.5 mg total) by mouth in the morning and at bedtime.   celecoxib (CELEBREX) 200 MG capsule TAKE 1 CAPSULE BY MOUTH DAILY   clonazePAM (KLONOPIN) 1 MG tablet TAKE 1 TABLET BY MOUTH AT BEDTIME AND 1 DAILY AS NEEDED FOR ANXIETY   Cyanocobalamin 1000 MCG SUBL Place 1,000 mcg under the tongue daily.   fluticasone (FLONASE) 50 MCG/ACT nasal spray Place 1 spray into both nostrils 3 times/day as needed-between meals & bedtime.   HYDROcodone-acetaminophen (NORCO/VICODIN) 5-325 MG tablet Take 1 tablet by mouth every 8 (eight) hours as needed.   KRILL OIL PO Take 1 tablet by mouth daily.   montelukast (SINGULAIR) 10 MG tablet TAKE 1 TABLET BY MOUTH AT  BEDTIME   rOPINIRole (REQUIP) 1 MG tablet TAKE 1 TABLET BY MOUTH AT 9 AM,  1 TABLET AT 1 PM AND 1/2 TABLET  AT 5  PM   [START ON 08/31/2023] sertraline (ZOLOFT) 100 MG tablet Take 1.5 tablets (150 mg total) by mouth daily.   tamsulosin (FLOMAX) 0.4 MG CAPS capsule TAKE 2 CAPSULES BY MOUTH DAILY  AFTER SUPPER   traZODone (DESYREL) 50 MG tablet Take 0.5-1 tablets (25-50 mg total) by mouth at bedtime.   Vitamin D, Ergocalciferol, (DRISDOL) 1.25 MG (50000 UNIT) CAPS capsule TAKE 1 CAPSULE BY MOUTH EVERY  MONDAY   Zinc 50 MG TABS Take 50 mg by mouth at bedtime.   No facility-administered encounter medications on file as of 08/05/2023.    Allergies (verified) Patient has no known allergies.   History: Past Medical History:  Diagnosis Date    Allergic rhinosinusitis    Anxiety    Aortic valve sclerosis    Arthritis    knees, neck, shoulder   Carotid artery stenosis 2022   Mild   CHF (congestive heart failure) (HCC)    Cubital tunnel syndrome    DDD (degenerative disc disease), cervical    Depression    Elevated PSA    denies per patient   HTN (hypertension)    Hyperlipidemia    Hyperplasia of prostate with lower urinary tract symptoms (LUTS)    Hypertension    Mild dilation of ascending aorta (HCC)    Parkinson disease (HCC)    Prolapsed internal hemorrhoids, grade 3 04/19/2015   Rectal prolapse    Scoliosis of lumbosacral spine    Stroke (HCC)    x2   Tremor    Vitamin D deficiency    Wears hearing aid    bilateral   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   BENTALL PROCEDURE N/A 05/01/2018   Procedure: BIOLOGICAL BENTALL PROCEDURE AORTIC ROOT REPLACEMENT WITH VALSALVA GRAFT & INSPIRIS VALVE.;  Surgeon: Purcell Nails, MD;  Location: [facility] OR;  Service: Open Heart Surgery;  Laterality: N/A;   BUBBLE STUDY  04/13/2021   Procedure: BUBBLE STUDY;  Surgeon: Jodelle Red, MD;  Location: [facility] ENDOSCOPY;  Service: Cardiovascular;;   CATARACT EXTRACTION Bilateral 04/2018   COLONOSCOPY  2007   NASAL SINUS SURGERY  2006   RIGHT/LEFT HEART CATH AND CORONARY ANGIOGRAPHY N/A 04/27/2018   Procedure: RIGHT/LEFT HEART CATH AND CORONARY ANGIOGRAPHY;  Surgeon: Dolores Patty, MD;  Location: [facility] INVASIVE CV LAB;  Service: Cardiovascular;  Laterality: N/A;   TEE WITHOUT CARDIOVERSION N/A 04/27/2018   Procedure: TRANSESOPHAGEAL ECHOCARDIOGRAM (TEE);  Surgeon: Dolores Patty, MD;  Location: [facility] ENDOSCOPY;  Service: Cardiovascular;  Laterality: N/A;   TEE WITHOUT CARDIOVERSION N/A 05/01/2018   Procedure: TRANSESOPHAGEAL ECHOCARDIOGRAM (TEE);  Surgeon: Purcell Nails, MD;  Location: [facility] OR;  Service: Open Heart Surgery;  Laterality: N/A;   TEE WITHOUT  CARDIOVERSION N/A 04/13/2021   Procedure: TRANSESOPHAGEAL ECHOCARDIOGRAM (TEE);  Surgeon: Jodelle Red, MD;  Location: [facility] ENDOSCOPY;  Service: Cardiovascular;  Laterality: N/A;   TRANSANAL HEMORRHOIDAL DEARTERIALIZATION N/A 11/15/2015   Procedure: TRANSANAL HEMORRHOIDAL DEARTERIALIZATION;  Surgeon: Romie Levee, MD;  Location: [facility];  Service: General;  Laterality: N/A;   Family History  Problem Relation Age of Onset   COPD Mother    Heart attack Mother    Colon polyps Father    Prostate cancer Father    Parkinson's disease Father    Hyperlipidemia Father    Depression Sister    Alcohol abuse Sister    Hypertension Sister    Breast cancer Sister    Hypertension Brother  Prostate cancer Paternal Uncle    Social History   Socioeconomic History   Marital status: Single    Spouse name: Not on file   Number of children: 2   Years of education: Not on file   Highest education level: 12th grade  Occupational History   Occupation: retired    Comment: security guard  Tobacco Use   Smoking status: Every Day    Current packs/day: 0.15    Average packs/day: 0.2 packs/day for 50.0 years (7.5 ttl pk-yrs)    Types: Cigarettes   Smokeless tobacco: Never   Tobacco comments:    Patient has cut down to three cigarettes a day  Vaping Use   Vaping status: Former  Substance and Sexual Activity   Alcohol use: Not Currently    Comment: Occasional   Drug use: No   Sexual activity: Not Currently  Other Topics Concern   Not on file  Social History Narrative   1 son, 1 daughter   Agricultural consultant work   3 caffeine/day   Right handed   Social Determinants of Health   Financial Resource Strain: Low Risk  (08/05/2023)   Overall Financial Resource Strain (CARDIA)    Difficulty of Paying Living Expenses: Not very hard  Food Insecurity: No Food Insecurity (08/05/2023)   Hunger Vital Sign    Worried About Running Out of Food in the Last Year: Never true    Ran Out  of Food in the Last Year: Never true  Transportation Needs: No Transportation Needs (08/05/2023)   PRAPARE - Administrator, Civil Service (Medical): No    Lack of Transportation (Non-Medical): No  Physical Activity: Inactive (08/05/2023)   Exercise Vital Sign    Days of Exercise per Week: 0 days    Minutes of Exercise per Session: 0 min  Stress: No Stress Concern Present (08/05/2023)   [employer] of Occupational Health - Occupational Stress Questionnaire    Feeling of Stress : Not at all  Social Connections: Unknown (08/05/2023)   Social Connection and Isolation Panel [NHANES]    Frequency of Communication with Friends and Family: More than three times a week    Frequency of Social Gatherings with Friends and Family: Twice a week    Attends Religious Services: Patient declined    Database administrator or Organizations: Patient declined    Attends Banker Meetings: Never    Marital Status: Living with partner    Tobacco Counseling Ready to quit: Not Answered Counseling given: Not Answered Tobacco comments: Patient has cut down to three cigarettes a day   Clinical Intake:  Pre-visit preparation completed: Yes  Pain : No/denies pain Pain Score: 0-No pain     BMI - recorded: 22.85 Nutritional Status: BMI of 19-24  Normal Nutritional Risks: None Diabetes: No  How often do you need to have someone help you when you read instructions, pamphlets, or other written materials from your doctor or pharmacy?: 1 - Never  Interpreter Needed?: No  Information entered by :: Graciela Plato N. Burak Zerbe, LPN.   Activities of Daily Living    08/05/2023   11:35 AM 08/20/2022    4:02 PM  In your present state of health, do you have any difficulty performing the following activities:  Hearing? 0 0  Vision? 0 0  Difficulty concentrating or making decisions? 0 0  Walking or climbing stairs? 0 0  Dressing or bathing? 0 1  Doing errands, shopping? 0 0  Preparing  Food and eating ?  N N  Using the Toilet? N N  In the past six months, have you accidently leaked urine? N Y  Do you have problems with loss of bowel control? N N  Managing your Medications? N N  Managing your Finances? N N  Housekeeping or managing your Housekeeping? [place]    Patient Care Team: Blane Ohara, MD as PCP - General (Internal Medicine) Tat, Octaviano Batty, DO as Consulting Physician (Neurology) Georgeanna Lea, MD as Consulting Physician (Cardiology) Patricia Nettle, MD (Orthopedic Surgery) Zettie Pho, [facility] (Inactive) (Pharmacist) Maris Berger, MD as Consulting Physician (Ophthalmology)  Indicate any recent Medical Services you may have received from other than [facility] providers in the past year (date may be approximate).     Assessment:   This is a routine wellness examination for [employer].  Hearing/Vision screen Hearing Screening - Comments:: Patient wears hearing aids; bilateral. Vision Screening - Comments:: Wears rx glasses - up to date with routine eye exams with Maris Berger, MD.    Goals Addressed             This Visit's Progress    Patient Stated       To increase physical activity.      Depression Screen    08/05/2023   11:41 AM 07/18/2023    9:24 AM 06/02/2023    3:37 PM 01/24/2023    9:52 AM 09/30/2022   12:10 PM 08/20/2022    4:00 PM 08/05/2022    5:39 PM  PHQ 2/9 Scores  PHQ - 2 Score 0 0  5  5   PHQ- 9 Score 0 2  11  11       Information is confidential and restricted. Go to Review Flowsheets to unlock data.    Fall Risk    08/05/2023   11:33 AM 07/18/2023    9:24 AM 04/14/2023    8:27 AM 03/04/2023    2:10 PM 01/24/2023    9:52 AM  Fall Risk   Falls in the past year? 0 0 0 0 0  Number falls in past yr: 0 0 0 0 0  Injury with Fall? 0 0 0 0 0  Risk for fall due to : No Fall Risks No Fall Risks   No Fall Risks  Follow up Falls prevention discussed Falls evaluation completed Falls evaluation completed Falls evaluation completed  Falls evaluation completed    MEDICARE RISK AT HOME: Medicare Risk at Home Any stairs in or around the home?: Yes If so, are there any without handrails?: No Home free of loose throw rugs in walkways, pet beds, electrical cords, etc?: Yes Adequate lighting in your home to reduce risk of falls?: Yes Life alert?: No Use of a cane, walker or w/c?: No Grab bars in the bathroom?: Yes Shower chair or bench in shower?: Yes Elevated toilet seat or a handicapped toilet?: No  TIMED UP AND GO:  Was the test performed?  No    Cognitive Function:    08/05/2023   11:39 AM  MMSE - Mini Mental State Exam  Not completed: Unable to complete        08/05/2023   11:39 AM 08/20/2022    4:03 PM  6CIT Screen  What Year? 0 points 0 points  What month? 0 points 0 points  What time? 0 points 0 points  Count back from 20 0 points 0 points  Months in reverse 0 points 2 points  Repeat phrase 0 points 0 points  Total Score  0 points 2 points    Immunizations Immunization History  Administered Date(s) Administered   Fluad Quad(high Dose 65+) 05/15/2022   Fluad Trivalent(High Dose 65+) 07/18/2023   Influenza, High Dose Seasonal PF 06/16/2016   Influenza,inj,quad, With Preservative 05/24/2019   Influenza-Unspecified 06/29/2021   PFIZER(Purple Top)SARS-COV-2 Vaccination 11/01/2019, 11/30/2019, 08/23/2020   Pfizer Covid-19 Vaccine Bivalent Booster 28yrs & up 06/21/2021   Pfizer(Comirnaty)Fall Seasonal Vaccine 12 years and older 07/19/2022, 07/18/2023   Pneumococcal Conjugate-13 10/08/2016   Pneumococcal Polysaccharide-23 08/07/2021   Respiratory Syncytial Virus Vaccine,Recomb Aduvanted(Arexvy) 07/04/2022   Zoster Recombinant(Shingrix) 11/24/2017, 04/02/2018    TDAP status: Due, Education has been provided regarding the importance of this vaccine. Advised may receive this vaccine at local pharmacy or Health Dept. Aware to provide a copy of the vaccination record if obtained from local pharmacy or  Health Dept. Verbalized acceptance and understanding.  Flu Vaccine status: Up to date  Pneumococcal vaccine status: Up to date  Covid-19 vaccine status: Completed vaccines  Qualifies for Shingles Vaccine? Yes   Zostavax completed No   Shingrix Completed?: Yes  Screening Tests Health Maintenance  Topic Date Due   DTaP/Tdap/Td (1 - Tdap) Never done   COVID-19 Vaccine (7 - 2023-24 season) 09/12/2023   Medicare Annual Wellness (AWV)  08/04/2024   Fecal DNA (Cologuard)  10/29/2024   Pneumonia Vaccine 22+ Years old  Completed   INFLUENZA VACCINE  Completed   Zoster Vaccines- Shingrix  Completed   HPV VACCINES  Aged Out   Hepatitis C Screening  Discontinued    Health Maintenance  Health Maintenance Due  Topic Date Due   DTaP/Tdap/Td (1 - Tdap) Never done    Colorectal cancer screening: Type of screening: Cologuard. Completed 10/29/2021. Repeat every 3 years  Lung Cancer Screening: (Low Dose CT Chest recommended if Age 57-80 years, 20 pack-year currently smoking OR have quit w/in 15years.) does not qualify.   Lung Cancer Screening Referral: no  Additional Screening:  Hepatitis C Screening: does qualify; Discontinued 08/10/2021  Vision Screening: Recommended annual ophthalmology exams for early detection of glaucoma and other disorders of the eye. Is the patient up to date with their annual eye exam?  Yes  Who is the provider or what is the name of the office in which the patient attends annual eye exams? Maris Berger, MD. If pt is not established with a provider, would they like to be referred to a provider to establish care? No .   Dental Screening: Recommended annual dental exams for proper oral hygiene  Diabetic Foot Exam: N/A  Community Resource Referral / Chronic Care Management: CRR required this visit?  No   CCM required this visit?  No     Plan:     I have personally reviewed and noted the following in the patient's chart:   Medical and social  history Use of alcohol, tobacco or illicit drugs  Current medications and supplements including opioid prescriptions. Patient is currently taking opioid prescriptions. Information provided to patient regarding non-opioid alternatives. Patient advised to discuss non-opioid treatment plan with their provider. Functional ability and status Nutritional status Physical activity Advanced directives List of other physicians Hospitalizations, surgeries, and ER visits in previous 12 months Vitals Screenings to include cognitive, depression, and falls Referrals and appointments  In addition, I have reviewed and discussed with patient certain preventive protocols, quality metrics, and best practice recommendations. A written personalized care plan for preventive services as well as general preventive health recommendations were provided to patient.  Mickeal Needy, LPN   [id number]   After Visit Summary: (MyChart) Due to this being a telephonic visit, the after visit summary with patients personalized plan was offered to patient via MyChart   Nurse Notes: None

## 2023-08-05 NOTE — Patient Instructions (Signed)
Mr. Mcdavid , Thank you for taking time to come for your Medicare Wellness Visit. I appreciate your ongoing commitment to your health goals. Please review the following plan we discussed and let me know if I can assist you in the future.   Referrals/Orders/Follow-Ups/Clinician Recommendations: No  This is a list of the screening recommended for you and due dates:  Health Maintenance  Topic Date Due   DTaP/Tdap/Td vaccine (1 - Tdap) Never done   COVID-19 Vaccine (7 - 2023-24 season) 09/12/2023   Medicare Annual Wellness Visit  08/04/2024   Cologuard (Stool DNA test)  10/29/2024   Pneumonia Vaccine  Completed   Flu Shot  Completed   Zoster (Shingles) Vaccine  Completed   HPV Vaccine  Aged Out   Hepatitis C Screening  Discontinued    Advanced directives: (Declined) Advance directive discussed with you today. Even though you declined this today, please call our office should you change your mind, and we can give you the proper paperwork for you to fill out.  Next Medicare Annual Wellness Visit scheduled for next year: No

## 2023-08-05 NOTE — Progress Notes (Unsigned)
EP to read

## 2023-08-06 ENCOUNTER — Other Ambulatory Visit: Payer: Self-pay

## 2023-08-06 ENCOUNTER — Encounter: Payer: Self-pay | Admitting: *Deleted

## 2023-08-06 ENCOUNTER — Ambulatory Visit
Admission: EM | Admit: 2023-08-06 | Discharge: 2023-08-06 | Disposition: A | Discharge status: Home / Self Care | Payer: Medicare Other | Attending: Family Medicine | Admitting: Family Medicine

## 2023-08-06 DIAGNOSIS — R197 Diarrhea, unspecified: Secondary | ICD-10-CM | POA: Diagnosis not present

## 2023-08-06 LAB — POCT URINALYSIS DIP (MANUAL ENTRY)
Bilirubin, UA: NEGATIVE
Blood, UA: NEGATIVE
Glucose, UA: NEGATIVE mg/dL
Leukocytes, UA: NEGATIVE
Nitrite, UA: NEGATIVE
Protein Ur, POC: NEGATIVE mg/dL
Spec Grav, UA: 1.025 (ref 1.010–1.025)
Urobilinogen, UA: 2 U/dL — AB
pH, UA: 6.5 (ref 5.0–8.0)

## 2023-08-06 LAB — POCT FASTING CBG KUC MANUAL ENTRY: POCT Glucose (KUC): 124 mg/dL — AB (ref 70–99)

## 2023-08-06 LAB — C DIFFICILE QUICK SCREEN W PCR REFLEX
C Diff antigen: NEGATIVE
C Diff interpretation: NOT DETECTED
C Diff toxin: NEGATIVE

## 2023-08-06 MED ORDER — SODIUM CHLORIDE 0.9 % IV BOLUS
1000.0000 mL | Freq: Once | INTRAVENOUS | Status: AC
  Administered 2023-08-06: 1000 mL via INTRAVENOUS

## 2023-08-06 NOTE — ED Triage Notes (Signed)
Pt reports his roommate has been dealing with c-diff and just got out of the hospital yesterday. Today he started having diarrhea with nausea. Reports 3 watery stools since 3:30 today. He has taken imodium. States he "feels weak" and "a little bit light-headed".

## 2023-08-06 NOTE — Discharge Instructions (Addendum)
Please do your best to ensure adequate fluid intake in order to avoid dehydration. If you find that you are unable to tolerate drinking fluids regularly please proceed to the Emergency Department for evaluation.  You have had labs (blood work and a stool sample) sent today. We will call you with any significant abnormalities or if there is need to begin or change treatment or pursue further follow up.  You may also review your test results online through MyChart. If you do not have a MyChart account, instructions to sign up should be on your discharge paperwork.

## 2023-08-07 LAB — BASIC METABOLIC PANEL
BUN/Creatinine Ratio: 25 — ABNORMAL HIGH (ref 10–24)
BUN: 18 mg/dL (ref 8–27)
CO2: 22 mmol/L (ref 20–29)
Calcium: 9 mg/dL (ref 8.6–10.2)
Chloride: 104 mmol/L (ref 96–106)
Creatinine, Ser: 0.72 mg/dL — ABNORMAL LOW (ref 0.76–1.27)
Glucose: 111 mg/dL — ABNORMAL HIGH (ref 70–99)
Potassium: 4 mmol/L (ref 3.5–5.2)
Sodium: 142 mmol/L (ref 134–144)
eGFR: 96 mL/min/{1.73_m2} (ref >59)

## 2023-08-07 LAB — CBC WITH DIFFERENTIAL/PLATELET
Basophils Absolute: 0 10*3/uL (ref 0.0–0.2)
Basos: 0 %
EOS (ABSOLUTE): 0.2 10*3/uL (ref 0.0–0.4)
Eos: 1 %
Hematocrit: 42.9 % (ref 37.5–51.0)
Hemoglobin: 14.6 g/dL (ref 13.0–17.7)
Immature Grans (Abs): 0 10*3/uL (ref 0.0–0.1)
Immature Granulocytes: 0 %
Lymphocytes Absolute: 0.3 10*3/uL — ABNORMAL LOW (ref 0.7–3.1)
Lymphs: 3 %
MCH: 34.8 pg — ABNORMAL HIGH (ref 26.6–33.0)
MCHC: 34 g/dL (ref 31.5–35.7)
MCV: 102 fL — ABNORMAL HIGH (ref 79–97)
Monocytes Absolute: 0.4 10*3/uL (ref 0.1–0.9)
Monocytes: 4 %
Neutrophils Absolute: 9.6 10*3/uL — ABNORMAL HIGH (ref 1.4–7.0)
Neutrophils: 92 %
Platelets: 196 10*3/uL (ref 150–450)
RBC: 4.19 x10E6/uL (ref 4.14–5.80)
RDW: 12.1 % (ref 11.6–15.4)
WBC: 10.6 10*3/uL (ref 3.4–10.8)

## 2023-08-07 NOTE — ED Provider Notes (Signed)
[facility] CARE CENTER   [id number] 08/06/23 Arrival Time: 1702  ASSESSMENT & PLAN:  1. Diarrhea, unspecified type    Suspect viral illness but with C. Diff exposure. Meds ordered this encounter  Medications   sodium chloride 0.9 % bolus 1,000 mL  Feeling better after IVF.  Pending:     C Difficile Quick Screen w PCR reflex   CBC with Differential/Platelet   Basic metabolic panel     Discharge Instructions      Please do your best to ensure adequate fluid intake in order to avoid dehydration. If you find that you are unable to tolerate drinking fluids regularly please proceed to the Emergency Department for evaluation.  You have had labs (blood work and a stool sample) sent today. We will call you with any significant abnormalities or if there is need to begin or change treatment or pursue further follow up.  You may also review your test results online through MyChart. If you do not have a MyChart account, instructions to sign up should be on your discharge paperwork.     Reviewed expectations re: course of current medical issues. Questions answered. Outlined signs and symptoms indicating need for more acute intervention. Patient verbalized understanding. After Visit Summary given.   SUBJECTIVE: History from: patient.  Travis Reese is a 77 y.o. male who presents with complaint of non-bloody diarrhea. Denies n/v. Afebrile. Feels fatigued. Roommate with C. Diff. He reports 3 watery stools today. Mild abd cramping.   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   BENTALL PROCEDURE N/A 05/01/2018   Procedure: BIOLOGICAL BENTALL PROCEDURE AORTIC ROOT REPLACEMENT WITH VALSALVA GRAFT & INSPIRIS VALVE.;  Surgeon: Purcell Nails, MD;  Location: [facility] OR;  Service: Open Heart Surgery;  Laterality: N/A;   BUBBLE STUDY  04/13/2021   Procedure: BUBBLE STUDY;  Surgeon: Jodelle Red, MD;  Location: [facility] ENDOSCOPY;   Service: Cardiovascular;;   CATARACT EXTRACTION Bilateral 04/2018   COLONOSCOPY  2007   NASAL SINUS SURGERY  2006   RIGHT/LEFT HEART CATH AND CORONARY ANGIOGRAPHY N/A 04/27/2018   Procedure: RIGHT/LEFT HEART CATH AND CORONARY ANGIOGRAPHY;  Surgeon: Dolores Patty, MD;  Location: [facility] INVASIVE CV LAB;  Service: Cardiovascular;  Laterality: N/A;   TEE WITHOUT CARDIOVERSION N/A 04/27/2018   Procedure: TRANSESOPHAGEAL ECHOCARDIOGRAM (TEE);  Surgeon: Dolores Patty, MD;  Location: [facility] ENDOSCOPY;  Service: Cardiovascular;  Laterality: N/A;   TEE WITHOUT CARDIOVERSION N/A 05/01/2018   Procedure: TRANSESOPHAGEAL ECHOCARDIOGRAM (TEE);  Surgeon: Purcell Nails, MD;  Location: [facility] OR;  Service: Open Heart Surgery;  Laterality: N/A;   TEE WITHOUT CARDIOVERSION N/A 04/13/2021   Procedure: TRANSESOPHAGEAL ECHOCARDIOGRAM (TEE);  Surgeon: Jodelle Red, MD;  Location: [facility] ENDOSCOPY;  Service: Cardiovascular;  Laterality: N/A;   TRANSANAL HEMORRHOIDAL DEARTERIALIZATION N/A 11/15/2015   Procedure: TRANSANAL HEMORRHOIDAL DEARTERIALIZATION;  Surgeon: Romie Levee, MD;  Location: [facility];  Service: General;  Laterality: N/A;     OBJECTIVE:  Vitals:   08/06/23 1730  BP: (!) 152/93  Pulse: 82  Resp: 18  Temp: 99.6 F (37.6 C)  TempSrc: Oral  SpO2: 95%    General appearance: alert; no distress Oropharynx: dry Lungs: clear to auscultation bilaterally; unlabored Heart: regular rate and rhythm Abdomen: soft; non-distended; no significant abdominal tenderness; reports "cramping" feeling; bowel sounds present; no masses or organomegaly; no guarding or rebound tenderness Back: no CVA tenderness Extremities: no edema; symmetrical with no gross deformities Skin: warm; dry Neurologic:  normal gait Psychological: alert and cooperative; normal mood and affect  .  No Known Allergies                                             Past Medical History:  Diagnosis Date    Allergic rhinosinusitis    Anxiety    Aortic valve sclerosis    Arthritis    knees, neck, shoulder   Carotid artery stenosis 2022   Mild   CHF (congestive heart failure) (HCC)    Cubital tunnel syndrome    DDD (degenerative disc disease), cervical    Depression    Elevated PSA    denies per patient   HTN (hypertension)    Hyperlipidemia    Hyperplasia of prostate with lower urinary tract symptoms (LUTS)    Hypertension    Mild dilation of ascending aorta (HCC)    Parkinson disease (HCC)    Prolapsed internal hemorrhoids, grade 3 04/19/2015   Rectal prolapse    Scoliosis of lumbosacral spine    Stroke (HCC)    x2   Tremor    Vitamin D deficiency    Wears hearing aid    bilateral   Social History   Socioeconomic History   Marital status: Single    Spouse name: Not on file   Number of children: 2   Years of education: Not on file   Highest education level: 12th grade  Occupational History   Occupation: retired    Comment: security guard  Tobacco Use   Smoking status: Every Day    Current packs/day: 0.15    Average packs/day: 0.2 packs/day for 50.0 years (7.5 ttl pk-yrs)    Types: Cigarettes   Smokeless tobacco: Never   Tobacco comments:    Patient has cut down to three cigarettes a day  Vaping Use   Vaping status: Former  Substance and Sexual Activity   Alcohol use: Not Currently    Comment: Occasional   Drug use: No   Sexual activity: Not Currently  Other Topics Concern   Not on file  Social History Narrative   1 son, 1 daughter   Agricultural consultant work   3 caffeine/day   Right handed   Social Determinants of Health   Financial Resource Strain: Low Risk  (08/05/2023)   Overall Financial Resource Strain (CARDIA)    Difficulty of Paying Living Expenses: Not very hard  Food Insecurity: No Food Insecurity (08/05/2023)   Hunger Vital Sign    Worried About Running Out of Food in the Last Year: Never true    Ran Out of Food in the Last Year: Never true   Transportation Needs: No Transportation Needs (08/05/2023)   PRAPARE - Administrator, Civil Service (Medical): No    Lack of Transportation (Non-Medical): No  Physical Activity: Inactive (08/05/2023)   Exercise Vital Sign    Days of Exercise per Week: 0 days    Minutes of Exercise per Session: 0 min  Stress: No Stress Concern Present (08/05/2023)   [employer] of Occupational Health - Occupational Stress Questionnaire    Feeling of Stress : Not at all  Social Connections: Unknown (08/05/2023)   Social Connection and Isolation Panel [NHANES]    Frequency of Communication with Friends and Family: More than three times a week    Frequency of Social Gatherings with Friends and Family: Twice a  week    Attends Religious Services: Patient declined    Active Member of Clubs or Organizations: Patient declined    Attends Banker Meetings: Never    Marital Status: Living with partner  Intimate Partner Violence: Not At Risk (08/05/2023)   Humiliation, Afraid, Rape, and Kick questionnaire    Fear of Current or Ex-Partner: No    Emotionally Abused: No    Physically Abused: No    Sexually Abused: No   Family History  Problem Relation Age of Onset   COPD Mother    Heart attack Mother    Colon polyps Father    Prostate cancer Father    Parkinson's disease Father    Hyperlipidemia Father    Depression Sister    Alcohol abuse Sister    Hypertension Sister    Breast cancer Sister    Hypertension Brother    Prostate cancer Paternal Guido Sander, MD 08/07/23 [phone]
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
## 2023-08-11 NOTE — Telephone Encounter (Signed)
Appointment  as been made with Dr. Faylene Kurtz for 12/10.

## 2023-08-12 ENCOUNTER — Ambulatory Visit: Payer: Medicare Other

## 2023-08-13 ENCOUNTER — Ambulatory Visit: Payer: Medicare Other | Admitting: Family Medicine

## 2023-08-13 ENCOUNTER — Ambulatory Visit: Payer: Medicare Other | Admitting: Physician Assistant

## 2023-08-13 VITALS — BP 110/78 | HR 72 | Temp 97.6°F | Resp 16 | Ht 74.0 in | Wt 172.6 lb

## 2023-08-13 DIAGNOSIS — R739 Hyperglycemia, unspecified: Secondary | ICD-10-CM

## 2023-08-13 DIAGNOSIS — J3089 Other allergic rhinitis: Secondary | ICD-10-CM

## 2023-08-13 DIAGNOSIS — R55 Syncope and collapse: Secondary | ICD-10-CM

## 2023-08-13 LAB — HEMOGLOBIN A1C
Est. average glucose Bld gHb Est-mCnc: 114 mg/dL
Hgb A1c MFr Bld: 5.6 % (ref 4.8–5.6)

## 2023-08-14 ENCOUNTER — Telehealth: Payer: Self-pay

## 2023-08-14 ENCOUNTER — Encounter: Payer: Self-pay | Admitting: Family Medicine

## 2023-08-14 DIAGNOSIS — R739 Hyperglycemia, unspecified: Secondary | ICD-10-CM

## 2023-08-14 HISTORY — DX: Hyperglycemia, unspecified: R73.9
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
## 2023-08-14 NOTE — Assessment & Plan Note (Signed)
Reports of pressure and motion sickness related to sinus issues. - Discontinue Benadryl use, PCP also advised him not to take Benadryl as well - Start Zyrtec once daily, gave samples - Continue Flonase nasal spray - Use steam inhalation - Consider nasal saline spray or drops

## 2023-08-14 NOTE — Assessment & Plan Note (Signed)
Labs drawn A1C normal - 5.6%

## 2023-08-14 NOTE — Telephone Encounter (Signed)
Copied from CRM [id number]. Topic: General - Other >> Aug 14, 2023 10:34 AM Sasha H wrote: Reason for CRM: PT would like a call back in regards to paperwork that provider has to fill out that is due today

## 2023-08-14 NOTE — Telephone Encounter (Signed)
Spoke with patient and made him aware we will fax paperwork to [facility] and call him once it is done, and he can come pick up a copy.

## 2023-08-15 ENCOUNTER — Ambulatory Visit: Payer: Medicare Other | Admitting: Family Medicine
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
## 2023-08-19 ENCOUNTER — Ambulatory Visit: Payer: Medicare Other | Admitting: Neurology

## 2023-08-19 ENCOUNTER — Encounter: Payer: Self-pay | Admitting: Neurology

## 2023-08-19 VITALS — BP 132/60 | HR 69 | Ht 74.0 in | Wt 188.0 lb

## 2023-08-19 DIAGNOSIS — G4700 Insomnia, unspecified: Secondary | ICD-10-CM | POA: Diagnosis not present

## 2023-08-19 DIAGNOSIS — G20B1 Parkinson's disease with dyskinesia, without mention of fluctuations: Secondary | ICD-10-CM

## 2023-08-19 MED ORDER — AMANTADINE HCL 100 MG PO CAPS: ORAL_CAPSULE | ORAL | 1 refills | Status: DC

## 2023-08-20 ENCOUNTER — Telehealth: Payer: Self-pay | Admitting: *Deleted

## 2023-08-20 ENCOUNTER — Other Ambulatory Visit: Payer: Self-pay | Admitting: Psychiatry

## 2023-08-20 MED ORDER — TRAZODONE HCL 100 MG PO TABS: 100.0000 mg | ORAL_TABLET | Freq: Every evening | ORAL | 1 refills | Status: DC | PRN

## 2023-08-20 NOTE — Telephone Encounter (Signed)
Order is sent. Could you contact [facility], and cancel 50 mg order? thanks

## 2023-08-20 NOTE — Telephone Encounter (Signed)
willing to try a higher dose of trazodone, 100 mg at night as needed for insomnia

## 2023-08-20 NOTE — Telephone Encounter (Signed)
Could you ask him if he is willing to try a higher dose of trazodone, 100 mg at night as needed for insomnia, unless he experiences any side effects from the medication?

## 2023-08-20 NOTE — Telephone Encounter (Signed)
Patient called   LVM stated that the traZODone (DESYREL) 50 MG tablet  isn't working & that he needs  something different & needs to try a change.

## 2023-08-20 NOTE — Telephone Encounter (Signed)
contacted [facility], and canceled 50 mg order

## 2023-08-21 DIAGNOSIS — R55 Syncope and collapse: Secondary | ICD-10-CM

## 2023-09-10 ENCOUNTER — Ambulatory Visit: Payer: Self-pay | Admitting: Family Medicine

## 2023-09-10 ENCOUNTER — Ambulatory Visit: Payer: Medicare Other | Admitting: Physician Assistant

## 2023-09-10 DIAGNOSIS — R402362 Coma scale, best motor response, obeys commands, at arrival to emergency department: Secondary | ICD-10-CM | POA: Diagnosis not present

## 2023-09-10 DIAGNOSIS — S0285XA Fracture of orbit, unspecified, initial encounter for closed fracture: Secondary | ICD-10-CM | POA: Diagnosis not present

## 2023-09-10 DIAGNOSIS — S065XAA Traumatic subdural hemorrhage with loss of consciousness status unknown, initial encounter: Secondary | ICD-10-CM | POA: Diagnosis not present

## 2023-09-10 DIAGNOSIS — S065X0A Traumatic subdural hemorrhage without loss of consciousness, initial encounter: Secondary | ICD-10-CM | POA: Diagnosis not present

## 2023-09-10 DIAGNOSIS — Z743 Need for continuous supervision: Secondary | ICD-10-CM | POA: Diagnosis not present

## 2023-09-10 DIAGNOSIS — I444 Left anterior fascicular block: Secondary | ICD-10-CM | POA: Diagnosis not present

## 2023-09-10 DIAGNOSIS — R9431 Abnormal electrocardiogram [ECG] [EKG]: Secondary | ICD-10-CM | POA: Diagnosis not present

## 2023-09-10 DIAGNOSIS — F17210 Nicotine dependence, cigarettes, uncomplicated: Secondary | ICD-10-CM | POA: Diagnosis not present

## 2023-09-10 DIAGNOSIS — M542 Cervicalgia: Secondary | ICD-10-CM | POA: Diagnosis not present

## 2023-09-10 DIAGNOSIS — M546 Pain in thoracic spine: Secondary | ICD-10-CM | POA: Diagnosis not present

## 2023-09-10 DIAGNOSIS — T1490XA Injury, unspecified, initial encounter: Secondary | ICD-10-CM | POA: Diagnosis not present

## 2023-09-10 DIAGNOSIS — Z043 Encounter for examination and observation following other accident: Secondary | ICD-10-CM | POA: Diagnosis not present

## 2023-09-10 DIAGNOSIS — S066X0A Traumatic subarachnoid hemorrhage without loss of consciousness, initial encounter: Secondary | ICD-10-CM | POA: Diagnosis not present

## 2023-09-10 DIAGNOSIS — S01511A Laceration without foreign body of lip, initial encounter: Secondary | ICD-10-CM | POA: Diagnosis not present

## 2023-09-10 DIAGNOSIS — W1830XA Fall on same level, unspecified, initial encounter: Secondary | ICD-10-CM | POA: Diagnosis not present

## 2023-09-10 DIAGNOSIS — S065X9A Traumatic subdural hemorrhage with loss of consciousness of unspecified duration, initial encounter: Secondary | ICD-10-CM | POA: Diagnosis not present

## 2023-09-10 DIAGNOSIS — S2241XA Multiple fractures of ribs, right side, initial encounter for closed fracture: Secondary | ICD-10-CM | POA: Diagnosis not present

## 2023-09-10 DIAGNOSIS — R6889 Other general symptoms and signs: Secondary | ICD-10-CM | POA: Diagnosis not present

## 2023-09-10 DIAGNOSIS — R58 Hemorrhage, not elsewhere classified: Secondary | ICD-10-CM | POA: Diagnosis not present

## 2023-09-10 DIAGNOSIS — R9389 Abnormal findings on diagnostic imaging of other specified body structures: Secondary | ICD-10-CM | POA: Diagnosis not present

## 2023-09-10 DIAGNOSIS — G2581 Restless legs syndrome: Secondary | ICD-10-CM | POA: Diagnosis not present

## 2023-09-10 DIAGNOSIS — S299XXA Unspecified injury of thorax, initial encounter: Secondary | ICD-10-CM | POA: Diagnosis not present

## 2023-09-10 DIAGNOSIS — G8911 Acute pain due to trauma: Secondary | ICD-10-CM | POA: Diagnosis not present

## 2023-09-10 DIAGNOSIS — R55 Syncope and collapse: Secondary | ICD-10-CM | POA: Diagnosis not present

## 2023-09-10 DIAGNOSIS — I4510 Unspecified right bundle-branch block: Secondary | ICD-10-CM | POA: Diagnosis not present

## 2023-09-10 DIAGNOSIS — I10 Essential (primary) hypertension: Secondary | ICD-10-CM | POA: Diagnosis not present

## 2023-09-10 DIAGNOSIS — S06A0XA Traumatic brain compression without herniation, initial encounter: Secondary | ICD-10-CM | POA: Diagnosis not present

## 2023-09-10 DIAGNOSIS — R402142 Coma scale, eyes open, spontaneous, at arrival to emergency department: Secondary | ICD-10-CM | POA: Diagnosis not present

## 2023-09-10 DIAGNOSIS — W19XXXA Unspecified fall, initial encounter: Secondary | ICD-10-CM | POA: Diagnosis not present

## 2023-09-10 DIAGNOSIS — S22089A Unspecified fracture of T11-T12 vertebra, initial encounter for closed fracture: Secondary | ICD-10-CM | POA: Diagnosis not present

## 2023-09-10 DIAGNOSIS — R402252 Coma scale, best verbal response, oriented, at arrival to emergency department: Secondary | ICD-10-CM | POA: Diagnosis not present

## 2023-09-10 DIAGNOSIS — S22088A Other fracture of T11-T12 vertebra, initial encounter for closed fracture: Secondary | ICD-10-CM | POA: Diagnosis not present

## 2023-09-10 DIAGNOSIS — Z952 Presence of prosthetic heart valve: Secondary | ICD-10-CM | POA: Diagnosis not present

## 2023-09-10 DIAGNOSIS — S199XXA Unspecified injury of neck, initial encounter: Secondary | ICD-10-CM | POA: Diagnosis not present

## 2023-09-10 DIAGNOSIS — S0240DA Maxillary fracture, left side, initial encounter for closed fracture: Secondary | ICD-10-CM | POA: Diagnosis not present

## 2023-09-10 DIAGNOSIS — S134XXA Sprain of ligaments of cervical spine, initial encounter: Secondary | ICD-10-CM | POA: Diagnosis not present

## 2023-09-10 DIAGNOSIS — I452 Bifascicular block: Secondary | ICD-10-CM | POA: Diagnosis not present

## 2023-09-10 DIAGNOSIS — S2231XA Fracture of one rib, right side, initial encounter for closed fracture: Secondary | ICD-10-CM | POA: Diagnosis not present

## 2023-09-10 DIAGNOSIS — S066X9A Traumatic subarachnoid hemorrhage with loss of consciousness of unspecified duration, initial encounter: Secondary | ICD-10-CM | POA: Diagnosis not present

## 2023-09-10 DIAGNOSIS — G20A1 Parkinson's disease without dyskinesia, without mention of fluctuations: Secondary | ICD-10-CM | POA: Diagnosis not present

## 2023-09-10 DIAGNOSIS — R404 Transient alteration of awareness: Secondary | ICD-10-CM | POA: Diagnosis not present

## 2023-09-10 DIAGNOSIS — E7800 Pure hypercholesterolemia, unspecified: Secondary | ICD-10-CM | POA: Diagnosis not present

## 2023-09-10 DIAGNOSIS — Z7982 Long term (current) use of aspirin: Secondary | ICD-10-CM | POA: Diagnosis not present

## 2023-09-10 DIAGNOSIS — R531 Weakness: Secondary | ICD-10-CM | POA: Diagnosis not present

## 2023-09-10 DIAGNOSIS — S62522A Displaced fracture of distal phalanx of left thumb, initial encounter for closed fracture: Secondary | ICD-10-CM | POA: Diagnosis not present

## 2023-09-10 DIAGNOSIS — D62 Acute posthemorrhagic anemia: Secondary | ICD-10-CM | POA: Diagnosis not present

## 2023-09-10 NOTE — Telephone Encounter (Signed)
 Copied from CRM [id number]. Topic: Clinical - Red Word Triage >> Sep 10, 2023 11:23 AM Carlatta H wrote: [facility] that prompted transfer to Nurse Triage: Patient has been constipated for about 1 week and medication previously prescribed is not working//Patient has also be experiencing dizziness and nausea.  Chief Complaint:  Constipation for about 1 week. Bowl movement today but was only small pellets and very small amount. Currently  using suppositories and it is not working not working. Patient denies sitting on the total for extended periods of times. Symptoms: atient has been experiencing dizziness, nausea. Also, he share his legs are weak Frequency:  x 1 week Pertinent Negatives: Patient denies abdominal pain nor is it swollen.  Denies any rectal bleeding. Disposition: [] ED /[] Urgent Care (no appt availability in office) / [x] Appointment(In office/virtual)/ []  [facility]/ [] Home Care/ [] Refused Recommended Disposition /[] [facility]/ []  Follow-up with PCP Additional Notes:  Patient is on a new medication that started 2 weeks ago named Amantadine . 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  Common Side effects listed  Dizziness, dry  mouth, Nausea and constipation and swelling legs or feet. Also patient is only drinking about 24 ounces of fluids per day. Patient Scheduled in office to be seen today. Reason for Disposition  [1] Constipation persists > 1 week AND [2] no improvement after using Care Advice  Answer Assessment - Initial Assessment Questions 1. STOOL PATTERN OR FREQUENCY: How often do you have a bowel movement (BM)?  (Normal range: 3 times a day to every 3 days)  When was your last BM?        Once a day  and today only a few small pellet 2. STRAINING: Do you have to strain to have a BM?       Part of the time 3. RECTAL PAIN: Does your rectum hurt when the stool comes out? If Yes, ask: Do you have hemorrhoids? How bad is the pain?   (Scale 1-10; or mild, moderate, severe)      Denies   and No pain 4. STOOL COMPOSITION: Are the stools hard?       Hard and small pellet 5. BLOOD ON STOOLS: Has there been any blood on the toilet tissue or on the surface of the BM? If Yes, ask: When was the last time?      Denies. 6. CHRONIC CONSTIPATION: Is this a new problem for you?  If No, ask: How long have you had this problem? (days, weeks, months)        New Issue a week ago  7. CHANGES IN DIET OR HYDRATION: Have there been any recent changes in your diet? How much fluids are you drinking on a daily basis?  How much have you had to drink today?      No changes in recent diet . 24 ounces 8. MEDICINES: Have you been taking any new medicines? Are you taking any narcotic pain medicines? (e.g., Dilaudid , morphine , Percocet, Vicodin)       Denies any medicine  - amantadine     started it couple  of weeks.Denies Narcotic medicine. 9. LAXATIVES: Have you been using any stool softeners, laxatives, or enemas?  If Yes, ask What, how often, and when was the last time?     Suppositories for a 3-4 days. It worked at first but not now.  Insert suppositories - does not feel an stool in the his rectum- Concern that it may be a blockage. Take Parkinson  medicines 3 times per day and  he take the new medicine 2 times per day. 10. ACTIVITY:  How much walking do you do every day?  Has your activity level decreased in the past week?         Walking  has decreased - new weakness - feel rubbery 11. CAUSE: What do you think is causing the constipation?          Yes/ 12. OTHER SYMPTOMS: Do you have any other symptoms? (e.g., abdomen pain, bloating, fever, vomiting)        Dizziness and nausea 13. MEDICAL HISTORY: Do you have a history of hemorrhoids, rectal fissures, or rectal surgery or rectal abscess?          Yes, rectum surgery 2-3 years ago. His rectum had dropped and  they had to tack it back up  Protocols used:  Constipation-A-AH

## 2023-09-11 DIAGNOSIS — Z9181 History of falling: Secondary | ICD-10-CM | POA: Insufficient documentation

## 2023-09-11 DIAGNOSIS — S065X0A Traumatic subdural hemorrhage without loss of consciousness, initial encounter: Secondary | ICD-10-CM | POA: Insufficient documentation

## 2023-09-11 DIAGNOSIS — S2242XD Multiple fractures of ribs, left side, subsequent encounter for fracture with routine healing: Secondary | ICD-10-CM | POA: Insufficient documentation

## 2023-09-11 DIAGNOSIS — S02401D Maxillary fracture, unspecified, subsequent encounter for fracture with routine healing: Secondary | ICD-10-CM | POA: Insufficient documentation

## 2023-09-11 DIAGNOSIS — S22089D Unspecified fracture of T11-T12 vertebra, subsequent encounter for fracture with routine healing: Secondary | ICD-10-CM | POA: Insufficient documentation

## 2023-09-11 DIAGNOSIS — S62502D Fracture of unspecified phalanx of left thumb, subsequent encounter for fracture with routine healing: Secondary | ICD-10-CM | POA: Insufficient documentation

## 2023-09-11 DIAGNOSIS — D62 Acute posthemorrhagic anemia: Secondary | ICD-10-CM | POA: Insufficient documentation

## 2023-09-12 ENCOUNTER — Telehealth: Payer: Self-pay

## 2023-09-12 NOTE — Telephone Encounter (Signed)
 Called Santana back and she stated that patient is in ICU at [facility] due to a fall, he has a brain bleed. And provider wanted to know why patient was taking spironolactone .  Made her aware of information and made her aware patient is in epic and hospital should be able to see records in chart if not they can call office for imformation

## 2023-09-12 NOTE — Telephone Encounter (Signed)
 Copied from CRM [id number]. Topic: General - Other >> Sep 11, 2023  2:48 PM Laurier BROCKS wrote: Reason for CRM: Santana Gravely [phone] is the patients roommate. She is with the patient at the hospital. The treating doctor wants to know Why the patient is prescribed spironolactone  (ALDACTONE ) 25 MG tablet. Patient is currently in the ICU unit at [facility]. Santana would like a call back

## 2023-09-13 ENCOUNTER — Other Ambulatory Visit: Payer: Self-pay | Admitting: Cardiology

## 2023-09-15 DIAGNOSIS — R55 Syncope and collapse: Secondary | ICD-10-CM | POA: Diagnosis not present

## 2023-09-17 ENCOUNTER — Other Ambulatory Visit: Payer: Self-pay

## 2023-09-18 ENCOUNTER — Telehealth: Payer: Self-pay | Admitting: Cardiology

## 2023-09-18 NOTE — Telephone Encounter (Signed)
Spoke with pt who confirms that he is taking Carvedilol 12.5 mg twice daily as we have reconciled. No changes

## 2023-09-18 NOTE — Telephone Encounter (Signed)
Pt c/o medication issue:  1. Name of Medication:  carvedilol (COREG) 25 MG tablet  2. How are you currently taking this medication (dosage and times per day)?   3. Are you having a reaction (difficulty breathing--STAT)?   4. What is your medication issue?   Patient would like to confirm correct dose + medication instructions.
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
## 2023-09-23 ENCOUNTER — Ambulatory Visit (INDEPENDENT_AMBULATORY_CARE_PROVIDER_SITE_OTHER): Payer: Medicare Other | Admitting: Family Medicine

## 2023-09-23 VITALS — BP 136/82 | HR 85 | Temp 97.8°F | Ht 74.0 in | Wt 159.0 lb

## 2023-09-23 DIAGNOSIS — G4701 Insomnia due to medical condition: Secondary | ICD-10-CM | POA: Diagnosis not present

## 2023-09-23 DIAGNOSIS — G2581 Restless legs syndrome: Secondary | ICD-10-CM | POA: Diagnosis not present

## 2023-09-23 DIAGNOSIS — D62 Acute posthemorrhagic anemia: Secondary | ICD-10-CM | POA: Diagnosis not present

## 2023-09-23 DIAGNOSIS — S065XAA Traumatic subdural hemorrhage with loss of consciousness status unknown, initial encounter: Secondary | ICD-10-CM | POA: Diagnosis not present

## 2023-09-23 DIAGNOSIS — R55 Syncope and collapse: Secondary | ICD-10-CM

## 2023-09-23 DIAGNOSIS — S62502D Fracture of unspecified phalanx of left thumb, subsequent encounter for fracture with routine healing: Secondary | ICD-10-CM | POA: Diagnosis not present

## 2023-09-23 DIAGNOSIS — S0083XA Contusion of other part of head, initial encounter: Secondary | ICD-10-CM

## 2023-09-23 DIAGNOSIS — S065X0D Traumatic subdural hemorrhage without loss of consciousness, subsequent encounter: Secondary | ICD-10-CM | POA: Diagnosis not present

## 2023-09-23 DIAGNOSIS — Z8673 Personal history of transient ischemic attack (TIA), and cerebral infarction without residual deficits: Secondary | ICD-10-CM | POA: Diagnosis not present

## 2023-09-23 DIAGNOSIS — G20A1 Parkinson's disease without dyskinesia, without mention of fluctuations: Secondary | ICD-10-CM | POA: Diagnosis not present

## 2023-09-23 DIAGNOSIS — E785 Hyperlipidemia, unspecified: Secondary | ICD-10-CM | POA: Diagnosis not present

## 2023-09-23 DIAGNOSIS — S22089D Unspecified fracture of T11-T12 vertebra, subsequent encounter for fracture with routine healing: Secondary | ICD-10-CM | POA: Diagnosis not present

## 2023-09-23 DIAGNOSIS — F319 Bipolar disorder, unspecified: Secondary | ICD-10-CM | POA: Diagnosis not present

## 2023-09-23 DIAGNOSIS — Z9181 History of falling: Secondary | ICD-10-CM | POA: Diagnosis not present

## 2023-09-23 DIAGNOSIS — G20A2 Parkinson's disease without dyskinesia, with fluctuations: Secondary | ICD-10-CM | POA: Diagnosis not present

## 2023-09-23 DIAGNOSIS — Z791 Long term (current) use of non-steroidal anti-inflammatories (NSAID): Secondary | ICD-10-CM | POA: Diagnosis not present

## 2023-09-23 DIAGNOSIS — R498 Other voice and resonance disorders: Secondary | ICD-10-CM | POA: Diagnosis not present

## 2023-09-23 DIAGNOSIS — W010XXA Fall on same level from slipping, tripping and stumbling without subsequent striking against object, initial encounter: Secondary | ICD-10-CM

## 2023-09-23 DIAGNOSIS — S2242XD Multiple fractures of ribs, left side, subsequent encounter for fracture with routine healing: Secondary | ICD-10-CM | POA: Diagnosis not present

## 2023-09-23 DIAGNOSIS — I10 Essential (primary) hypertension: Secondary | ICD-10-CM | POA: Diagnosis not present

## 2023-09-23 DIAGNOSIS — I110 Hypertensive heart disease with heart failure: Secondary | ICD-10-CM

## 2023-09-23 DIAGNOSIS — S62525A Nondisplaced fracture of distal phalanx of left thumb, initial encounter for closed fracture: Secondary | ICD-10-CM | POA: Diagnosis not present

## 2023-09-23 DIAGNOSIS — S02401D Maxillary fracture, unspecified, subsequent encounter for fracture with routine healing: Secondary | ICD-10-CM | POA: Diagnosis not present

## 2023-09-23 DIAGNOSIS — E782 Mixed hyperlipidemia: Secondary | ICD-10-CM

## 2023-09-23 DIAGNOSIS — I5032 Chronic diastolic (congestive) heart failure: Secondary | ICD-10-CM | POA: Diagnosis not present

## 2023-09-23 NOTE — Patient Instructions (Signed)
STOP SPIRONOLACTONE. FOLLOW UP WITH SPECIALISTS.  REFER TO CARDIOLOGY.

## 2023-09-23 NOTE — Progress Notes (Addendum)
Subjective:  Patient ID: Travis Reese, male    DOB: March 14, 1946  Age: 78 y.o. MRN: [id number]  Chief Complaint  Patient presents with   Medical Management of Chronic Issues    HPI  The patient, a 78 year old with a history of Parkinson's disease, hypertension, and benign prostatic hyperplasia, presents for follow-up after a recent fall.  Per the patient and his wife, he was outside of [employer] the [employer] store by [employer] in [place] and hit a large pothole in the sidewalk resulting in the fall.  The fall resulted in a subdural hematoma, orbital fracture, thumb fracture, and rib fractures. The patient was airlifted to [facility] for treatment from [facility]. The patient reports ongoing dizziness, which was present before the fall. The patient's spouse reports that the patient's appetite has decreased and he has lost weight. The patient's medication regimen includes Sinemet, amantadine, atorvastatin, krill oil, tamsulosin, sertraline, and carvedilol. He is scheduled to follow up with [facility], but was also recommended to follow up with the ENT and trauma team at [facility].  Parkinson's disease:  Current Medications: Sinemet IR 25-100mg  take 2 tablets TID, Sinemet CR 50-200 1 tablet at bedtime. Fairly stable. Also on amantadine 100 mg twice daily.  Hyperlipidemia: Taking Atorvastatin 20 mg daily, Krill oil.  BPH: Current medications: Tamsulosin 0.4 mg 2 in the evening.  Bipolar Depression:  Current Medications: cymbalta 60 mg one tablet twice daily, Scheduled to see psychiatrist in December 2023. Referred by Dr. Arbutus Leas.  Wellbutrin XL 150 mg  daily. He is still taking the clonazepam even thought his psychiatrist has told him he needed to come off of this. He says he cannot sleep.  HYPERTENSIVE CHF: Coreg 25 mg TWICE DAILY AND FUROSEMIDE 20 MG every day.  The patient has stopped taking spironolactone due to a lack of swelling in the ankles.  Back pain: Hydrocodone takes  as needed  B12 deficiency: Taking b12 daily.   Patient has had dizziness and a syncopal episode in the past.  He had a recent Holter monitor that showed occasional SVT without symptoms.  I have restricted him from driving.  We discussed going to cardiology for further workup.  This syncopal episode preceded the fall.  It occurred June 30, 2023 while he was driving.     08/05/2023   11:41 AM 07/18/2023    9:24 AM 06/02/2023    3:37 PM 01/24/2023    9:52 AM 09/30/2022   12:10 PM  Depression screen PHQ 2/9  Decreased Interest 0 0  3   Down, Depressed, Hopeless 0 0  2   PHQ - 2 Score 0 0  5   Altered sleeping 0 0  0   Tired, decreased energy 0 0  3   Change in appetite 0 2  0   Feeling bad or failure about yourself  0 0  3   Trouble concentrating 0 0  0   Moving slowly or fidgety/restless 0 0  0   Suicidal thoughts 0 0  0   PHQ-9 Score 0 2  11   Difficult doing work/chores Not difficult at all Not difficult at all  Not difficult at all      Information is confidential and restricted. Go to Review Flowsheets to unlock data.        10/06/2023   10:14 AM  Fall Risk   Falls in the past year? 1  Number falls in past yr: 0  Injury with Fall? 1  Follow up Falls evaluation completed    Patient Care Team: Blane Ohara, MD as PCP - General (Internal Medicine) Georgeanna Lea, MD as PCP - Cardiology (Cardiology) Tat, Octaviano Batty, DO as Consulting Physician (Neurology) Georgeanna Lea, MD as Consulting Physician (Cardiology) Patricia Nettle, MD (Orthopedic Surgery) Zettie Pho, [facility] (Inactive) (Pharmacist) Maris Berger, MD as Consulting Physician (Ophthalmology)   Review of Systems  Constitutional:  Negative for chills, diaphoresis, fatigue and fever.  HENT:  Negative for congestion, ear pain and sore throat.   Respiratory:  Negative for cough and shortness of breath.   Cardiovascular:  Negative for chest pain and leg swelling.  Gastrointestinal:  Negative for abdominal  pain, constipation, diarrhea, nausea and vomiting.  Genitourinary:  Negative for dysuria and urgency.  Musculoskeletal:  Negative for arthralgias and myalgias.  Neurological:  Negative for dizziness and headaches.  Psychiatric/Behavioral:  Negative for dysphoric mood.     Current Outpatient Medications on File Prior to Visit  Medication Sig Dispense Refill   cholecalciferol (VITAMIN D3) 25 MCG (1000 UNIT) tablet Take 2,000 Units by mouth daily.     acetaminophen (TYLENOL) 325 MG tablet Take 2 tablets (650 mg total) by mouth every 6 (six) hours as needed for mild pain (or Fever >/= 101).     albuterol (VENTOLIN HFA) 108 (90 Base) MCG/ACT inhaler Inhale 1-2 puffs into the lungs every 6 (six) hours as needed for wheezing or shortness of breath.     aspirin EC 325 MG EC tablet Take 1 tablet (325 mg total) by mouth daily. 30 tablet 0   atorvastatin (LIPITOR) 20 MG tablet TAKE 1 TABLET BY MOUTH ONCE  DAILY 100 tablet 0   buPROPion (WELLBUTRIN XL) 150 MG 24 hr tablet Take 1 tablet (150 mg total) by mouth daily. 7 tablet 0   carbidopa-levodopa (SINEMET CR) 50-200 MG tablet TAKE 1 TABLET BY MOUTH EVERY  NIGHT AT BEDTIME 90 tablet 0   celecoxib (CELEBREX) 200 MG capsule TAKE 1 CAPSULE BY MOUTH DAILY 100 capsule 2   Cyanocobalamin 1000 MCG SUBL Place 1,000 mcg under the tongue daily.     fluticasone (FLONASE) 50 MCG/ACT nasal spray Place 1 spray into both nostrils 3 times/day as needed-between meals & bedtime. 48 g 3   HYDROcodone-acetaminophen (NORCO/VICODIN) 5-325 MG tablet Take 1 tablet by mouth every 8 (eight) hours as needed for moderate pain (pain score 4-6) or severe pain (pain score 7-10).     KRILL OIL PO Take 1 tablet by mouth daily.     montelukast (SINGULAIR) 10 MG tablet TAKE 1 TABLET BY MOUTH AT  BEDTIME 100 tablet 2   polyethylene glycol powder (GLYCOLAX/MIRALAX) 17 GM/SCOOP powder Take 0.5 Containers by mouth as needed for mild constipation or moderate constipation.     sertraline  (ZOLOFT) 100 MG tablet Take 1.5 tablets (150 mg total) by mouth daily. 135 tablet 0   tamsulosin (FLOMAX) 0.4 MG CAPS capsule TAKE 2 CAPSULES BY MOUTH DAILY  AFTER SUPPER 200 capsule 0   Zinc 50 MG TABS Take 50 mg by mouth at bedtime.     No current facility-administered medications on file prior to visit.   Past Medical History:  Diagnosis Date   Abnormal sputum color 07/22/2022   Acute non-recurrent maxillary sinusitis 06/29/2023   Allergic rhinitis due to allergen 07/22/2022   Anxiety    Aortic valve sclerosis    Arthritis    knees, neck, shoulder   Atherosclerosis of aorta (HCC) 09/29/2021   B12 deficiency 09/29/2021  Benign hypertension 07/10/2016   Bipolar I disorder with depression (HCC) 01/27/2023   BPH (benign prostatic hyperplasia) 04/19/2018   Carotid artery stenosis 2022   Mild   Cerumen impaction 07/22/2022   CHF (congestive heart failure) (HCC)    Chronic diastolic CHF (congestive heart failure) (HCC) 04/19/2018   Closed nondisplaced fracture of distal phalanx of left thumb 09/24/2023   Contusion of face 09/24/2023   Cubital tunnel syndrome    DDD (degenerative disc disease), cervical    Depression    Elevated PSA    denies per patient   Elevated serum glucose 08/14/2023   Esophageal dysphagia 03/20/2023   Fall on same level from tripping 09/24/2023   Healthcare maintenance 07/18/2023   History of cardioembolic cerebrovascular accident (CVA)    HTN (hypertension)    Hyperlipidemia    Hyperplasia of prostate with lower urinary tract symptoms (LUTS)    Hypertensive heart disease with chronic diastolic congestive heart failure (HCC) 04/02/2021   Insomnia due to medical condition 09/24/2023   Mild dilation of ascending aorta (HCC)    Mixed hyperlipidemia 09/23/2016   Overview:   2018: dx/rx MIS     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   Parkinson disease (HCC)    Parkinson's disease  without dyskinesia, with fluctuating manifestations (HCC) 03/20/2023   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/2020   PFO (patent foramen ovale)    Prolapsed internal hemorrhoids, grade 3 04/19/2015   Rectal prolapse    S/P aortic valve replacement with bioprosthetic valve 05/01/2018   S/p left hip fracture 06/23/2021   Scoliosis of lumbosacral spine    Stroke (HCC)    x2   Subdural hematoma (HCC) 09/24/2023   Syncope 07/18/2023   Tobacco use 07/22/2022   Tremor    Vitamin D deficiency    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   BENTALL PROCEDURE N/A 05/01/2018   Procedure: BIOLOGICAL BENTALL PROCEDURE AORTIC ROOT REPLACEMENT WITH VALSALVA GRAFT & INSPIRIS VALVE.;  Surgeon: Purcell Nails, MD;  Location: [facility] OR;  Service: Open Heart Surgery;  Laterality: N/A;   BUBBLE STUDY  04/13/2021   Procedure: BUBBLE STUDY;  Surgeon: Jodelle Red, MD;  Location: [facility] ENDOSCOPY;  Service: Cardiovascular;;   CATARACT EXTRACTION Bilateral 04/2018   COLONOSCOPY  2007   NASAL SINUS SURGERY  2006   RIGHT/LEFT HEART CATH AND CORONARY ANGIOGRAPHY N/A 04/27/2018   Procedure: RIGHT/LEFT HEART CATH AND CORONARY ANGIOGRAPHY;  Surgeon: Dolores Patty, MD;  Location: [facility] INVASIVE CV LAB;  Service: Cardiovascular;  Laterality: N/A;   TEE WITHOUT CARDIOVERSION N/A 04/27/2018   Procedure: TRANSESOPHAGEAL ECHOCARDIOGRAM (TEE);  Surgeon: Dolores Patty, MD;  Location: [facility] ENDOSCOPY;  Service: Cardiovascular;  Laterality: N/A;   TEE WITHOUT CARDIOVERSION N/A 05/01/2018   Procedure: TRANSESOPHAGEAL ECHOCARDIOGRAM (TEE);  Surgeon: Purcell Nails, MD;  [facility] OR;  Service: Open Heart Surgery;  Laterality: N/A;   TEE WITHOUT CARDIOVERSION N/A 04/13/2021   Procedure: TRANSESOPHAGEAL ECHOCARDIOGRAM (TEE);  Surgeon: Jodelle Red, MD;  Location: [facility] ENDOSCOPY;  Service: Cardiovascular;  Laterality: N/A;   TRANSANAL HEMORRHOIDAL  DEARTERIALIZATION N/A 11/15/2015   Procedure: TRANSANAL HEMORRHOIDAL DEARTERIALIZATION;  Surgeon: Romie Levee, MD;  Location: [facility];  Service: General;  Laterality: N/A;    Family History  Problem Relation Age of Onset   COPD Mother    Heart attack Mother    Colon polyps Father  Prostate cancer Father    Parkinson's disease Father    Hyperlipidemia Father    Depression Sister    Alcohol abuse Sister    Hypertension Sister    Breast cancer Sister    Hypertension Brother    Prostate cancer Paternal Uncle    Social History   Socioeconomic History   Marital status: Single    Spouse name: Not on file   Number of children: 2   Years of education: Not on file   Highest education level: 12th grade  Occupational History   Occupation: retired    Comment: security guard  Tobacco Use   Smoking status: Every Day    Current packs/day: 0.15    Average packs/day: 0.2 packs/day for 50.0 years (7.5 ttl pk-yrs)    Types: Cigarettes   Smokeless tobacco: Never   Tobacco comments:    Patient has cut down to three cigarettes a day  Vaping Use   Vaping status: Former  Substance and Sexual Activity   Alcohol use: Not Currently    Comment: Occasional   Drug use: No   Sexual activity: Not Currently  Other Topics Concern   Not on file  Social History Narrative   1 son, 1 daughter   Agricultural consultant work   3 caffeine/day   Right handed   Social Drivers of Health   Financial Resource Strain: Low Risk  (09/20/2023)   Overall Financial Resource Strain (CARDIA)    Difficulty of Paying Living Expenses: Not hard at all  Food Insecurity: No Food Insecurity (09/20/2023)   Hunger Vital Sign    Worried About Running Out of Food in the Last Year: Never true    Ran Out of Food in the Last Year: Never true  Transportation Needs: No Transportation Needs (09/20/2023)   PRAPARE - Administrator, Civil Service (Medical): No    Lack of Transportation (Non-Medical): No   Physical Activity: Inactive (09/20/2023)   Exercise Vital Sign    Days of Exercise per Week: 0 days    Minutes of Exercise per Session: 0 min  Stress: Stress Concern Present (09/20/2023)   [employer] of Occupational Health - Occupational Stress Questionnaire    Feeling of Stress : To some extent  Social Connections: Moderately Isolated (09/20/2023)   Social Connection and Isolation Panel [NHANES]    Frequency of Communication with Friends and Family: Three times a week    Frequency of Social Gatherings with Friends and Family: Once a week    Attends Religious Services: 1 to 4 times per year    Active Member of [employer] or Organizations: No    Attends Engineer, structural: Never    Marital Status: Divorced    Objective:  BP 136/82   Pulse 85   Temp 97.8 F (36.6 C)   Ht 6\' 2"  (1.88 m)   Wt 159 lb (72.1 kg)   SpO2 98%   BMI 20.41 kg/m      10/06/2023   10:14 AM 09/25/2023    3:27 PM 09/23/2023    2:07 PM  BP/Weight  Systolic BP 116 124 136  Diastolic BP 62 80 82  Wt. (Lbs) 163 161.6 159  BMI 20.93 kg/m2 20.75 kg/m2 20.41 kg/m2    Physical Exam Vitals reviewed.  Constitutional:      Appearance: Normal appearance.  HENT:     Head:   Neck:     Vascular: No carotid bruit.  Cardiovascular:     Rate and Rhythm: Normal rate  and regular rhythm.     Heart sounds: Normal heart sounds.  Pulmonary:     Effort: Pulmonary effort is normal.     Breath sounds: Normal breath sounds. No wheezing, rhonchi or rales.  Abdominal:     General: Bowel sounds are normal.     Palpations: Abdomen is soft.     Tenderness: There is no abdominal tenderness.  Musculoskeletal:       Arms:  Neurological:     Mental Status: He is alert.  Psychiatric:        Mood and Affect: Mood normal.        Behavior: Behavior normal.     Diabetic Foot Exam - Simple   No data filed      Lab Results  Component Value Date   WBC 8.4 09/23/2023   HGB 12.5 (L) 09/23/2023   HCT 37.1 (L)  09/23/2023   PLT 277 09/23/2023   GLUCOSE 116 (H) 09/23/2023   CHOL 106 07/18/2023   TRIG 76 07/18/2023   HDL 54 07/18/2023   LDLCALC 36 07/18/2023   ALT 12 09/23/2023   AST 18 09/23/2023   NA 138 09/23/2023   K 4.6 09/23/2023   CL 99 09/23/2023   CREATININE 0.78 09/23/2023   BUN 13 09/23/2023   CO2 26 09/23/2023   TSH 1.160 07/18/2023   INR 1.58 05/01/2018   HGBA1C 5.6 08/13/2023      Assessment & Plan:    Mixed hyperlipidemia Assessment & Plan: Well controlled.  No changes to medicines. Continue atorvastatin 20 mg daily and krill oil. Continue to work on eating a healthy diet and exercise.  Labs drawn today.     Hypertensive heart disease with chronic diastolic congestive heart failure (HCC) Assessment & Plan: Well controlled.  No changes to medicines. Continue Coreg 25 mg TWICE DAILY. Continue to work on eating a healthy diet and exercise.  Labs drawn today.    Orders: -     CBC with Differential/Platelet -     Comprehensive metabolic panel  Parkinson's disease without dyskinesia, with fluctuating manifestations (HCC) Assessment & Plan: Reports of dizziness, possibly related to Parkinson's disease. -Continue current medications including Sinemet 25/100mg  two tablets three times a day, Sinemet CR 50/200mg  once at bedtime, and Amantadine.   Bipolar I disorder with depression [facility]) Assessment & Plan: Management per specialist.  Wean clonazepam. Call psychiatry if needs something for insomnia.    Contusion of face, initial encounter Assessment & Plan: HEALING, BUT pATIENT NEEDS TO FOLLOW UP WITH [facility] DUE TO ORBITAL FRACTURE.    Fall on same level from tripping Assessment & Plan: SECONDARY TO POTHOLE IN SIDEWALK PER PATIENT.    Syncope, unspecified syncope type Assessment & Plan: Refer to cardiology  Orders: -     Ambulatory referral to Cardiology  Subdural hematoma [facility]) Assessment & Plan: Recent fall with subdural hematoma and orbital  fracture. No surgical intervention required. -Follow up with [facility] specialist for orbital fracture.   Closed nondisplaced fracture of distal phalanx of left thumb, initial encounter Assessment & Plan: Recent fall with thumb fracture. -Follow up with Dr. Loralie Champagne for management of thumb fracture.      No orders of the defined types were placed in this encounter.   Orders Placed This Encounter  Procedures   CBC with Differential/Platelet   Comprehensive metabolic panel   Ambulatory referral to Cardiology     Follow-up: Return in about 3 months (around 12/22/2023) for chronic follow up.  Total time spent  on today's visit was 45 minutes, including both face-to-face time and nonface-to-face time personally spent on review of chart (labs and imaging), discussing labs and goals, discussing further work-up, treatment options, referrals to specialist if needed, reviewing outside records of pertinent, answering patient's questions, and coordinating care.   I,Marla I Leal-Borjas,acting as a scribe for Blane Ohara, MD.,have documented all relevant documentation on the behalf of Blane Ohara, MD,as directed by  Blane Ohara, MD while in the presence of Blane Ohara, MD.   An After Visit Summary was printed and given to the patient.  I attest that I have reviewed this visit and agree with the plan scribed by my staff.   Blane Ohara, MD [facility] [phone]

## 2023-09-24 ENCOUNTER — Encounter: Payer: Self-pay | Admitting: Family Medicine

## 2023-09-24 ENCOUNTER — Other Ambulatory Visit: Payer: Self-pay | Admitting: Adult Health

## 2023-09-24 DIAGNOSIS — G4701 Insomnia due to medical condition: Secondary | ICD-10-CM | POA: Insufficient documentation

## 2023-09-24 DIAGNOSIS — S62525A Nondisplaced fracture of distal phalanx of left thumb, initial encounter for closed fracture: Secondary | ICD-10-CM

## 2023-09-24 DIAGNOSIS — S0083XA Contusion of other part of head, initial encounter: Secondary | ICD-10-CM

## 2023-09-24 DIAGNOSIS — S065XAA Traumatic subdural hemorrhage with loss of consciousness status unknown, initial encounter: Secondary | ICD-10-CM | POA: Insufficient documentation

## 2023-09-24 DIAGNOSIS — W010XXA Fall on same level from slipping, tripping and stumbling without subsequent striking against object, initial encounter: Secondary | ICD-10-CM | POA: Insufficient documentation

## 2023-09-24 HISTORY — DX: Nondisplaced fracture of distal phalanx of left thumb, initial encounter for closed fracture: S62.525A

## 2023-09-24 HISTORY — DX: Insomnia due to medical condition: G47.01

## 2023-09-24 HISTORY — DX: Contusion of other part of head, initial encounter: S00.83XA

## 2023-09-24 HISTORY — DX: Fall on same level from slipping, tripping and stumbling without subsequent striking against object, initial encounter: W01.0XXA

## 2023-09-24 HISTORY — DX: Traumatic subdural hemorrhage with loss of consciousness status unknown, initial encounter: S06.5XAA

## 2023-09-24 LAB — COMPREHENSIVE METABOLIC PANEL
ALT: 12 [IU]/L (ref 0–44)
AST: 18 [IU]/L (ref 0–40)
Albumin: 4.2 g/dL (ref 3.8–4.8)
Alkaline Phosphatase: 120 [IU]/L (ref 44–121)
BUN/Creatinine Ratio: 17 (ref 10–24)
BUN: 13 mg/dL (ref 8–27)
Bilirubin Total: 1 mg/dL (ref 0.0–1.2)
CO2: 26 mmol/L (ref 20–29)
Calcium: 9.8 mg/dL (ref 8.6–10.2)
Chloride: 99 mmol/L (ref 96–106)
Creatinine, Ser: 0.78 mg/dL (ref 0.76–1.27)
Globulin, Total: 2.4 g/dL (ref 1.5–4.5)
Glucose: 116 mg/dL — ABNORMAL HIGH (ref 70–99)
Potassium: 4.6 mmol/L (ref 3.5–5.2)
Sodium: 138 mmol/L (ref 134–144)
Total Protein: 6.6 g/dL (ref 6.0–8.5)
eGFR: 93 mL/min/{1.73_m2} (ref >59)

## 2023-09-24 LAB — CBC WITH DIFFERENTIAL/PLATELET
Basophils Absolute: 0 10*3/uL (ref 0.0–0.2)
Basos: 0 %
EOS (ABSOLUTE): 0.1 10*3/uL (ref 0.0–0.4)
Eos: 1 %
Hematocrit: 37.1 % — ABNORMAL LOW (ref 37.5–51.0)
Hemoglobin: 12.5 g/dL — ABNORMAL LOW (ref 13.0–17.7)
Immature Grans (Abs): 0 10*3/uL (ref 0.0–0.1)
Immature Granulocytes: 0 %
Lymphocytes Absolute: 1.5 10*3/uL (ref 0.7–3.1)
Lymphs: 18 %
MCH: 34.9 pg — ABNORMAL HIGH (ref 26.6–33.0)
MCHC: 33.7 g/dL (ref 31.5–35.7)
MCV: 104 fL — ABNORMAL HIGH (ref 79–97)
Monocytes Absolute: 0.7 10*3/uL (ref 0.1–0.9)
Monocytes: 8 %
Neutrophils Absolute: 6 10*3/uL (ref 1.4–7.0)
Neutrophils: 73 %
Platelets: 277 10*3/uL (ref 150–450)
RBC: 3.58 x10E6/uL — ABNORMAL LOW (ref 4.14–5.80)
RDW: 12.4 % (ref 11.6–15.4)
WBC: 8.4 10*3/uL (ref 3.4–10.8)

## 2023-09-24 NOTE — Assessment & Plan Note (Signed)
Management per specialist.  Wean clonazepam. Call psychiatry if needs something for insomnia.

## 2023-09-24 NOTE — Assessment & Plan Note (Addendum)
Recent fall with subdural hematoma and orbital fracture. No surgical intervention required. -Follow up with [facility] specialist for orbital fracture.

## 2023-09-24 NOTE — Assessment & Plan Note (Signed)
Refer to cardiology 

## 2023-09-24 NOTE — Assessment & Plan Note (Signed)
Recent fall with thumb fracture. -Follow up with Dr. Loralie Champagne for management of thumb fracture.

## 2023-09-24 NOTE — Assessment & Plan Note (Signed)
Well controlled.  No changes to medicines. Continue atorvastatin 20 mg daily and krill oil. Continue to work on eating a healthy diet and exercise.  Labs drawn today.

## 2023-09-24 NOTE — Assessment & Plan Note (Signed)
Reports of dizziness, possibly related to Parkinson's disease. -Continue current medications including Sinemet 25/100mg  two tablets three times a day, Sinemet CR 50/200mg  once at bedtime, and Amantadine.

## 2023-09-24 NOTE — Assessment & Plan Note (Signed)
Well controlled.  No changes to medicines. Continue Coreg 25 mg TWICE DAILY. Continue to work on eating a healthy diet and exercise.  Labs drawn today.

## 2023-09-25 ENCOUNTER — Ambulatory Visit: Payer: Medicare Other | Attending: Cardiology | Admitting: Cardiology

## 2023-09-25 ENCOUNTER — Other Ambulatory Visit: Payer: Self-pay

## 2023-09-25 VITALS — BP 124/80 | HR 80 | Ht 74.0 in | Wt 161.6 lb

## 2023-09-25 DIAGNOSIS — R55 Syncope and collapse: Secondary | ICD-10-CM

## 2023-09-25 DIAGNOSIS — I10 Essential (primary) hypertension: Secondary | ICD-10-CM | POA: Insufficient documentation

## 2023-09-25 DIAGNOSIS — N401 Enlarged prostate with lower urinary tract symptoms: Secondary | ICD-10-CM | POA: Insufficient documentation

## 2023-09-25 DIAGNOSIS — H547 Unspecified visual loss: Secondary | ICD-10-CM | POA: Diagnosis not present

## 2023-09-25 DIAGNOSIS — I2510 Atherosclerotic heart disease of native coronary artery without angina pectoris: Secondary | ICD-10-CM

## 2023-09-25 DIAGNOSIS — E785 Hyperlipidemia, unspecified: Secondary | ICD-10-CM | POA: Insufficient documentation

## 2023-09-25 DIAGNOSIS — I5030 Unspecified diastolic (congestive) heart failure: Secondary | ICD-10-CM | POA: Diagnosis not present

## 2023-09-25 DIAGNOSIS — S065XAA Traumatic subdural hemorrhage with loss of consciousness status unknown, initial encounter: Secondary | ICD-10-CM

## 2023-09-25 DIAGNOSIS — Z953 Presence of xenogenic heart valve: Secondary | ICD-10-CM

## 2023-09-25 DIAGNOSIS — G20A1 Parkinson's disease without dyskinesia, without mention of fluctuations: Secondary | ICD-10-CM | POA: Insufficient documentation

## 2023-09-25 DIAGNOSIS — S065XAD Traumatic subdural hemorrhage with loss of consciousness status unknown, subsequent encounter: Secondary | ICD-10-CM | POA: Diagnosis not present

## 2023-09-25 DIAGNOSIS — R972 Elevated prostate specific antigen [PSA]: Secondary | ICD-10-CM | POA: Insufficient documentation

## 2023-09-25 DIAGNOSIS — F32A Depression, unspecified: Secondary | ICD-10-CM | POA: Insufficient documentation

## 2023-09-25 DIAGNOSIS — I639 Cerebral infarction, unspecified: Secondary | ICD-10-CM | POA: Insufficient documentation

## 2023-09-25 DIAGNOSIS — R251 Tremor, unspecified: Secondary | ICD-10-CM | POA: Insufficient documentation

## 2023-09-25 DIAGNOSIS — I509 Heart failure, unspecified: Secondary | ICD-10-CM | POA: Insufficient documentation

## 2023-09-25 MED ORDER — CARVEDILOL 3.125 MG PO TABS: 3.1250 mg | ORAL_TABLET | Freq: Two times a day (BID) | ORAL | 3 refills | Status: DC

## 2023-09-25 NOTE — Patient Instructions (Signed)
Medication Instructions:  Your physician has recommended you make the following change in your medication:  Decrease Coreg to 3.125 mg two times daily  *If you need a refill on your cardiac medications before your next appointment, please call your pharmacy*   Lab Work: NONE If you have labs (blood work) drawn today and your tests are completely normal, you will receive your results only by: MyChart Message (if you have MyChart) OR A paper copy in the mail If you have any lab test that is abnormal or we need to change your treatment, we will call you to review the results.   Testing/Procedures: Your physician has requested that you have an echocardiogram. Echocardiography is a painless test that uses sound waves to create images of your heart. It provides your doctor with information about the size and shape of your heart and how well your heart's chambers and valves are working. This procedure takes approximately one hour. There are no restrictions for this procedure. Please do NOT wear cologne, perfume, aftershave, or lotions (deodorant is allowed). Please arrive 15 minutes prior to your appointment time.  Please note: We ask at that you not bring children with you during ultrasound (echo/ vascular) testing. Due to room size and safety concerns, children are not allowed in the ultrasound rooms during exams. Our front office staff cannot provide observation of children in our lobby area while testing is being conducted. An adult accompanying a patient to their appointment will only be allowed in the ultrasound room at the discretion of the ultrasound technician under special circumstances. We apologize for any inconvenience.    Follow-Up: At [facility], you and your health needs are our priority.  As part of our continuing mission to provide you with exceptional heart care, we have created designated Provider Care Teams.  These Care Teams include your primary Cardiologist  (physician) and Advanced Practice Providers (APPs -  Physician Assistants and Nurse Practitioners) who all work together to provide you with the care you need, when you need it.  We recommend signing up for the patient portal called "MyChart".  Sign up information is provided on this After Visit Summary.  MyChart is used to connect with patients for Virtual Visits (Telemedicine).  Patients are able to view lab/test results, encounter notes, upcoming appointments, etc.  Non-urgent messages can be sent to your provider as well.   To learn more about what you can do with MyChart, go to [email or web address].    Your next appointment:   6 week(s)  Provider:   Gypsy Balsam, MD    Other Instructions Increase water intake. Purchase Compression Socks online, can look for ones with the zipper.

## 2023-09-25 NOTE — Progress Notes (Addendum)
 Cardiology Office Note:  .   Date:  09/25/2023  ID:  [name], DOB 01/24/1946, MRN [id number] PCP: Sherre Clapper, MD  [facility] Providers Cardiologist:  Lamar Fitch, MD    History of Present Illness: .   ABRIEL GEESEY is a 78 y.o. 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  06/14/2022 echocardiogram EF 55 to 60%, grade 1 DD, no AVR 05/08/2021 monitor revealed episodes of ventricular tachycardia at 4 bpm, no episodes of SVT or atrial fibrillation 01/22/2021 carotid ultrasound mild bilateral carotid artery disease 04/28/2018 right left heart cath revealed mild nonobstructive CAD with 40% calcified M LAD lesion, severe AI  Evaluated by Dr. Fitch on 06/04/2022, he was doing well from a cardiac perspective.  He had previously been hospitalized with multiple CVAs and found to have a PFO, he wore a long-term monitor which revealed no evidence of atrial fibrillation.  He had recently been diagnosed with Parkinson's.  An echocardiogram was arranged and completed on 06/14/2022 revealing an EF of 55 to 60%, grade 1 DD, no AVR was visualized.  Most recently evaluated 03/18/2023 --was stable at that time, episodes of dizziness, we reduced his dose of Coreg .  Admitted to [facility] on 09/10/2023 following a fall in a parking lot.  He was diagnosed with a 1.1 cm SDH, left maxillary sinus fracture, 6 rib fractures, left thumb fracture.  His aspirin  was held, he was started on Keppra eventually discharged home.   He presents today today for follow-up after syncopal event.  History is somewhat hard to obtain--apparently he had an episode where he slid out of bed at home>> then he had an episode where he was walking  in in a parking lot and stepped in a pothole falling which resulted in a subdural hematoma, orbital fracture, rib fractures, thumb fracture for which she was hospitalized at [facility].  He apparently had also had a syncopal event in October while he was driving--she is followed up with his PCP and is being restricted from driving.  He has had an unintentional weight loss, just feels nauseated like he does not have an appetite.  He does mention that his vision has gotten progressively worse, try to identify when this started for him and initially he states it was after his admission but then says he feels his vision started changing prior to.  From a cardiac perspective, he offers no formal complaints.    ROS: Review of Systems  Constitutional:  Positive for malaise/fatigue.  HENT: Negative.    Eyes:  Positive for blurred vision.  Respiratory: Negative.    Cardiovascular: Negative.   Gastrointestinal: Negative.   Genitourinary: Negative.   Musculoskeletal:  Positive for back pain and falls.  Skin: Negative.   Neurological:  Positive for dizziness and loss of consciousness.  Endo/Heme/Allergies:  Bruises/bleeds easily.     Studies Reviewed: SABRA   EKG Interpretation Date/Time:  Thursday September 25 2023 15:28:15 EST Ventricular Rate:  80 PR Interval:  144 QRS Duration:  158 QT Interval:  426 QTC Calculation: 491 R Axis:   -86  Text Interpretation: Normal sinus rhythm Right bundle branch block Left anterior fascicular block Bifascicular block Abnormal ECG When compared with ECG of 22-Jan-2021 10:32, No significant change was found Confirmed by Carlin Nest [id number])  on 09/25/2023 3:33:43 PM    Cardiac Studies & Procedures   CARDIAC CATHETERIZATION  CARDIAC CATHETERIZATION 04/27/2018  Narrative  Prox LAD to Mid LAD lesion is 40% stenosed.   Findings:  Ao = 104/49 (74) LV = 94/24 RA =  3 RV = 37/6 PA = 38/14 (25) PCW = 17 Fick cardiac output/index = 5.5/2.6 PVR = 1.5 WU Ao sat =  99% PA sat = 69%, 73%  Assessment: 1. Mild nonobstructive CAD with 40% calcified mLAD lesion. 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 Normal LVEF 3. 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 Essentially normal hemodynamics  Plan/Discussion:  Proceed with AVR later this week.  Toribio Fuel, MD 4:03 PM  Findings Coronary Findings Diagnostic  Dominance: Right  Left Main Vessel is angiographically normal.  Left Anterior Descending Prox LAD to Mid LAD lesion is 40% stenosed. The lesion is calcified.  Left Circumflex Vessel is angiographically normal.  Right Coronary Artery Vessel is normal in caliber. 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  Intervention  No interventions have been documented.    ECHOCARDIOGRAM  ECHOCARDIOGRAM COMPLETE 06/14/2022  Narrative ECHOCARDIOGRAM REPORT    Patient Name:   MAT STUARD Date of Exam: 06/14/2022 Medical Rec #:  [id number]       Height:       74.0 in Accession #:    [id number]      Weight:       185.0 lb Date of Birth:  09-27-1945      BSA:          2.103 m Patient Age:    75 years        BP:           115/73 mmHg Patient Gender: M               HR:           59 bpm. Exam Location:  [place]  Procedure: 2D Echo, Cardiac Doppler, Color Doppler and Strain Analysis  Indications:    S/P aortic valve replacement with bioprosthetic valve [Z95.3 (ICD-10-CM)]  History:        Patient has prior history of Echocardiogram examinations, most recent 01/21/2021. CHF, Aortic Valve Disease; Risk Factors:Dyslipidemia. BIOLOGICAL BENTALL PROCEDURE AORTIC ROOT REPLACEMENT WITH VALSALVA GRAFT & INSPIRIS VALVE. Aortic Valve: 27 mm InspirEase valve is present in the aortic position. Procedure Date: 05/01/2018 27 mm InspirEase valve.  Sonographer:    Charlie Jointer RDCS Referring Phys: [id number] ROBERT J KRASOWSKI  IMPRESSIONS   1. Vertical orientation of LV. Left ventricular ejection fraction, by  estimation, is 55 to 60%. The left ventricle has normal function. The left ventricle has no regional wall motion abnormalities. Left ventricular diastolic parameters are consistent with Grade I diastolic dysfunction (impaired relaxation). The average left ventricular global longitudinal strain is -15.4 %. The global longitudinal strain is abnormal. 2. Right ventricular systolic function is normal. The right ventricular size is normal. 3. The mitral valve is normal in structure. No evidence of mitral valve regurgitation. No evidence of mitral stenosis. 4. The aortic valve has been repaired/replaced. Aortic valve regurgitation is not visualized. There is a 27 mm InspirEase valve present in the aortic position. Procedure Date: 05/01/2018 27 mm InspirEase valve. Echo findings are consistent with normal structure and function of the aortic valve prosthesis. 5. Aortic Normal DTA and root/ascending aorta has been repaired/replaced. 6. The inferior vena cava is normal in size with greater than 50%  respiratory variability, suggesting right atrial pressure of 3 mmHg.  FINDINGS Left Ventricle: Vertical orientation of LV. Left ventricular ejection fraction, by estimation, is 55 to 60%. The left ventricle has normal function. The left ventricle has no regional wall motion abnormalities. The average left ventricular global longitudinal strain is -15.4 %. The global longitudinal strain is abnormal. The left ventricular internal cavity size was normal in size. There is no left ventricular hypertrophy. Left ventricular diastolic parameters are consistent with Grade I diastolic dysfunction (impaired relaxation). Normal left ventricular filling pressure.  Right Ventricle: The right ventricular size is normal. No increase in right ventricular wall thickness. Right ventricular systolic function is normal.  Left Atrium: Left atrial size was normal in size.  Right Atrium: Right atrial size was normal in  size.  Pericardium: There is no evidence of pericardial effusion.  Mitral Valve: The mitral valve is normal in structure. No evidence of mitral valve regurgitation. No evidence of mitral valve stenosis.  Tricuspid Valve: The tricuspid valve is normal in structure. Tricuspid valve regurgitation is not demonstrated. No evidence of tricuspid stenosis.  Aortic Valve: The aortic valve has been repaired/replaced. Aortic valve regurgitation is not visualized. Aortic valve mean gradient measures 7.0 mmHg. Aortic valve peak gradient measures 12.5 mmHg. There is a 27 mm InspirEase valve present in the aortic position. Procedure Date: 05/01/2018 27 mm InspirEase valve. Echo findings are consistent with normal structure and function of the aortic valve prosthesis.  Pulmonic Valve: The pulmonic valve was normal in structure. Pulmonic valve regurgitation is not visualized. No evidence of pulmonic stenosis.  Aorta: Normal DTA, the aortic root/ascending aorta has been repaired/replaced and the aortic root and ascending aorta are structurally normal, with no evidence of dilitation.  Venous: A normal flow pattern is recorded from the right upper pulmonary vein. The inferior vena cava is normal in size with greater than 50% respiratory variability, suggesting right atrial pressure of 3 mmHg.  IAS/Shunts: No atrial level shunt detected by color flow Doppler.   LEFT VENTRICLE PLAX 2D LVIDd:         5.20 cm Diastology LVIDs:         3.90 cm LV e' medial:    7.07 cm/s LV PW:         1.00 cm LV E/e' medial:  9.6 LV IVS:        1.00 cm LV e' lateral:   9.03 cm/s LV E/e' lateral: 7.5  2D Longitudinal Strain 2D Strain GLS Avg:     -15.4 %  RIGHT VENTRICLE            IVC RV Basal diam:  3.45 cm    IVC diam: 2.60 cm RV Mid diam:    2.00 cm RV S prime:     9.25 cm/s TAPSE (M-mode): 2.7 cm  LEFT ATRIUM             Index        RIGHT ATRIUM           Index LA diam:        1.90 cm 0.90 cm/m   RA Area:     16.20  cm LA Vol (A2C):   37.5 ml 17.83 ml/m  RA Volume:   52.60 ml  25.02 ml/m LA Vol (A4C):   46.2 ml 21.97 ml/m LA Biplane Vol: 41.6 ml 19.78 ml/m AORTIC VALVE AV Vmax:           177.00 cm/s AV Vmean:  122.000 cm/s AV VTI:            0.379 m AV Peak Grad:      12.5 mmHg AV Mean Grad:      7.0 mmHg LVOT Vmax:         150.00 cm/s LVOT Vmean:        92.100 cm/s LVOT VTI:          0.299 m LVOT/AV VTI ratio: 0.79  AORTA Ao Root diam: 2.90 cm Ao Asc diam:  3.70 cm Ao Desc diam: 2.60 cm  MITRAL VALVE MV Area (PHT): 2.24 cm    SHUNTS MV Decel Time: 338 msec    Systemic VTI: 0.30 m MV E velocity: 67.70 cm/s MV A velocity: 84.80 cm/s MV E/A ratio:  0.80  Redell Leiter MD Electronically signed by Redell Leiter MD Signature Date/Time: 06/14/2022/2:30:22 PM    Final  TEE  ECHO TEE 04/13/2021  Narrative TRANSESOPHOGEAL ECHO REPORT    Patient Name:   SARKIS RHINES Date of Exam: 04/13/2021 Medical Rec #:  [id number]       Height:       74.0 in Accession #:    [id number]      Weight:       187.0 lb Date of Birth:  01/08/1946      BSA:          2.112 m Patient Age:    74 years        BP:           161/93 mmHg Patient Gender: M               HR:           58 bpm. Exam Location:  Inpatient  Procedure: 3D Echo, Transesophageal Echo, Cardiac Doppler, Color Doppler and Saline Contrast Bubble Study  Indications:     Rule out PFO  History:         Patient has prior history of Echocardiogram examinations, most recent 04/08/2019. Hx stroke. Aortic Valve: 27 mm Edwards bovine valve is present in the aortic position. Procedure Date: 05/01/2017.  Sonographer:     Rome Eans RDCS (AE) Referring Phys:  [id number] BRIDGETTE CHRISTOPHER Diagnosing Phys: Shelda Bruckner MD  PROCEDURE: After discussion of the risks and benefits of a TEE, an informed consent was obtained from the patient. The transesophogeal probe was passed without difficulty through the esophogus of the  patient. Sedation performed by different physician. The patient was monitored while under deep sedation. Anesthestetic sedation was provided intravenously by Anesthesiology: 231.4mg  of Propofol . Image quality was good. The patient's vital signs; including heart rate, blood pressure, and oxygen saturation; remained stable throughout the procedure. The patient developed no complications during the procedure.  IMPRESSIONS   1. Left ventricular ejection fraction, by estimation, is 55 to 60%. The left ventricle has normal function. The left ventricle has no regional wall motion abnormalities. 2. Right ventricular systolic function is normal. The right ventricular size is normal. 3. No left atrial/left atrial appendage thrombus was detected. The LAA emptying velocity was 70 cm/s. 4. The mitral valve is normal in structure. Trivial mitral valve regurgitation. No evidence of mitral stenosis. 5. The aortic valve has been repaired/replaced. Aortic valve regurgitation is trivial. No aortic stenosis is present. There is a 27 mm Edwards bovine valve present in the aortic position. Procedure Date: 05/01/2017. 6. Aortic root/ascending aorta has been repaired/replaced. There is Moderate (Grade III) plaque involving the descending aorta. 7. Evidence of atrial level  shunting detected by color flow Doppler. Agitated saline contrast bubble study was positive with shunting observed within 3-6 cardiac cycles suggestive of interatrial shunt. There is a moderately sized patent foramen ovale with predominantly right to left shunting across the atrial septum.  Conclusion(s)/Recommendation(s): Study positive for patient foramen ovale, with right to left shunting by color doppler and agitated saline.  FINDINGS Left Ventricle: Left ventricular ejection fraction, by estimation, is 55 to 60%. The left ventricle has normal function. The left ventricle has no regional wall motion abnormalities. The left ventricular internal  cavity size was normal in size.  Right Ventricle: The right ventricular size is normal. No increase in right ventricular wall thickness. Right ventricular systolic function is normal.  Left Atrium: Left atrial size was normal in size. No left atrial/left atrial appendage thrombus was detected. The LAA emptying velocity was 70 cm/s.  Right Atrium: Right atrial size was normal in size.  Pericardium: There is no evidence of pericardial effusion.  Mitral Valve: The mitral valve is normal in structure. Trivial mitral valve regurgitation. No evidence of mitral valve stenosis. There is no evidence of mitral valve vegetation.  Tricuspid Valve: The tricuspid valve is normal in structure. Tricuspid valve regurgitation is trivial. No evidence of tricuspid stenosis. There is no evidence of tricuspid valve vegetation.  Aortic Valve: Overall normal functioning prosthesis with only trivial central regurgitation. The aortic valve has been repaired/replaced. Aortic valve regurgitation is trivial. No aortic stenosis is present. Aortic valve mean gradient measures 7.0 mmHg. Aortic valve peak gradient measures 14.1 mmHg. There is a 27 mm Edwards bovine valve present in the aortic position. Procedure Date: 05/01/2017. There is no evidence of aortic valve vegetation.  Pulmonic Valve: The pulmonic valve was grossly normal. Pulmonic valve regurgitation is not visualized. No evidence of pulmonic stenosis. There is no evidence of pulmonic valve vegetation.  Aorta: The aortic root/ascending aorta has been repaired/replaced. There is moderate (Grade III) plaque involving the descending aorta.  IAS/Shunts: The interatrial septum is aneurysmal. Evidence of atrial level shunting detected by color flow Doppler. Agitated saline contrast was given intravenously to evaluate for intracardiac shunting. Agitated saline contrast bubble study was positive with shunting observed within 3-6 cardiac cycles suggestive of interatrial  shunt. A moderately sized patent foramen ovale is detected with predominantly right to left shunting across the atrial septum.   AORTIC VALVE AV Vmax:      188.00 cm/s AV Vmean:     123.000 cm/s AV VTI:       0.435 m AV Peak Grad: 14.1 mmHg AV Mean Grad: 7.0 mmHg  Shelda Bruckner MD Electronically signed by Shelda Bruckner MD Signature Date/Time: 04/13/2021/2:09:09 PM    Final  MONITORS  LONG TERM MONITOR (3-14 DAYS) 09/15/2023  Narrative 14 day monitor  Sinus rhythm HR 49-101, avg 69 bpm Occasional supraventricular ectopy (2.3%) Rare PVCs (<1%) Brief SVT events occurred, the longest lasted 10.7 seconds. No patient symptom episodes reported.           Risk Assessment/Calculations:             Physical Exam:   VS:  BP 124/80   Pulse 80   Ht 6' 2 (1.88 m)   Wt 161 lb 9.6 oz (73.3 kg)   SpO2 97%   BMI 20.75 kg/m    Wt Readings from Last 3 Encounters:  09/25/23 161 lb 9.6 oz (73.3 kg)  09/23/23 159 lb (72.1 kg)  08/19/23 188 lb (85.3 kg)    GEN: Well nourished, well  developed in no acute distress NECK: No JVD; No carotid bruits CARDIAC: RRR, no murmurs, rubs, gallops RESPIRATORY:  Clear to auscultation without rales, wheezing or rhonchi  ABDOMEN: Soft, non-tender, non-distended EXTREMITIES:  No edema; No deformity   ASSESSMENT AND PLAN: .   Coronary artery disease-nonobstructive per LHC in 2019, Stable with no anginal symptoms. No indication for ischemic evaluation.  Aspirin  is on hold secondary to SDH, continue Crestor 20 mg daily, continue Coreg  but we will reduce his dose to 3.125 mg twice daily.  Subdural hematoma-s/p fall >> transferred to [facility] and was followed by neurosurgery with serial CTs.  He is also complaining of some vision loss, hard to decipher when this all started for him but he does feel the vision changes started prior to his subdural.  Regardless, need to arrange for repeat CT of his head to assess his subdural  hematoma.  Syncope-episode occurred in October 2024, PCP arranged a monitor which was unrevealing for contributory causes.  Will repeat echocardiogram.  Aortic valve replacement/Bentall procedure-s/p 2019, most recent echo revealed no AVR.  Continue SBE prophylaxis.  Blood pressure is well-controlled.  He had a syncopal episode >> will arrange repeat echocardiogram.  HFpEF-most recent echo in October 2023 revealed EF 55 to 60%, NYHA class II, euvolemic, continue Coreg  but we will decrease the dose of 3.125 mg twice daily.  Hypotension -multifactorial related to Parkinson's disease, decreasing his Coreg .  Advised him to get compression socks and wear during the day, also increase his oral hydration.  Hyperlipidemia-most recent LDL was well-controlled at 41 a month ago, continue atorvastatin  20 mg daily.  Parkinsons disease - stable, follows with Neurology (Dr. Evonnie)       Dispo: Echo, CT head WO for worsening vision following SDH, decrease Coreg  to 3.125 mg BID. Follow up in 6 weeks with Dr. Krasowski.   Signed, Delon JAYSON Hoover, NP

## 2023-09-26 ENCOUNTER — Telehealth: Payer: Self-pay | Admitting: Neurology

## 2023-09-26 ENCOUNTER — Telehealth (INDEPENDENT_AMBULATORY_CARE_PROVIDER_SITE_OTHER): Payer: Medicare Other | Admitting: Psychiatry

## 2023-09-26 ENCOUNTER — Other Ambulatory Visit: Payer: Self-pay

## 2023-09-26 ENCOUNTER — Encounter: Payer: Self-pay | Admitting: Psychiatry

## 2023-09-26 DIAGNOSIS — F419 Anxiety disorder, unspecified: Secondary | ICD-10-CM

## 2023-09-26 DIAGNOSIS — F3341 Major depressive disorder, recurrent, in partial remission: Secondary | ICD-10-CM

## 2023-09-26 DIAGNOSIS — G4710 Hypersomnia, unspecified: Secondary | ICD-10-CM

## 2023-09-26 DIAGNOSIS — G4700 Insomnia, unspecified: Secondary | ICD-10-CM

## 2023-09-26 DIAGNOSIS — G20B1 Parkinson's disease with dyskinesia, without mention of fluctuations: Secondary | ICD-10-CM

## 2023-09-26 MED ORDER — CARBIDOPA-LEVODOPA 25-100 MG PO TABS: 2.0000 | ORAL_TABLET | Freq: Three times a day (TID) | ORAL | 0 refills | Status: DC

## 2023-09-26 NOTE — Telephone Encounter (Signed)
Received note from Dr. Vanetta Shawl about new visual hallucinations.  She was concerned about the patient because he was having some visual hallucinations.  Patient apparently had been hospitalized fairly recently for subdural hematoma at [facility].  I reviewed some of those records.  I reviewed the rest of his chart and noted that cardiology just ordered a CT of his brain yesterday.  His primary care has also been consulted.  Patient was of normal mental status and neuroexam was reported to be nonfocal and nonlateralizing.  Annabelle Harman, please put him on my schedule February 3.  Can use the NP slot if have to since hosp f/u.  Tell him that should symptoms increase or should he have new or focal symptoms, he should go to the emergency room.

## 2023-09-26 NOTE — Telephone Encounter (Signed)
Rx sent

## 2023-09-26 NOTE — Telephone Encounter (Signed)
1. Which medications need to be refilled? (please list name of each medication and dose if known) carbidopa-levodopa (SINEMET IR) 25-100 MG tablet   2. Which pharmacy/location (including street and city if local pharmacy) is medication to be sent to? [facility]    3. Do they need a 30 day or 90 day supply?

## 2023-09-29 ENCOUNTER — Telehealth: Payer: Self-pay | Admitting: Neurology

## 2023-09-29 ENCOUNTER — Other Ambulatory Visit: Payer: Self-pay

## 2023-09-29 DIAGNOSIS — G20B1 Parkinson's disease with dyskinesia, without mention of fluctuations: Secondary | ICD-10-CM

## 2023-09-29 MED ORDER — CARBIDOPA-LEVODOPA 25-100 MG PO TABS: 2.0000 | ORAL_TABLET | Freq: Three times a day (TID) | ORAL | 0 refills | Status: DC

## 2023-09-29 NOTE — Telephone Encounter (Signed)
Pt is needing to speak with someone about the Carbidopa levodopa. He states that he does not think he will get in time from the mail order before he runs out.   He states he is not sure what he needs to do if that happens.  He states that if we have to call in to  local pharmacy just to get him enough until he can get it from the mail order  he would use  [facility]   Please call patient

## 2023-09-29 NOTE — Telephone Encounter (Signed)
Called patient and sent in a weeks worth until his meds come in through delivery

## 2023-09-30 ENCOUNTER — Other Ambulatory Visit: Payer: Self-pay | Admitting: Family Medicine

## 2023-09-30 DIAGNOSIS — Z791 Long term (current) use of non-steroidal anti-inflammatories (NSAID): Secondary | ICD-10-CM | POA: Diagnosis not present

## 2023-09-30 DIAGNOSIS — D62 Acute posthemorrhagic anemia: Secondary | ICD-10-CM | POA: Diagnosis not present

## 2023-09-30 DIAGNOSIS — G2581 Restless legs syndrome: Secondary | ICD-10-CM | POA: Diagnosis not present

## 2023-09-30 DIAGNOSIS — R498 Other voice and resonance disorders: Secondary | ICD-10-CM | POA: Diagnosis not present

## 2023-09-30 DIAGNOSIS — S62502D Fracture of unspecified phalanx of left thumb, subsequent encounter for fracture with routine healing: Secondary | ICD-10-CM | POA: Diagnosis not present

## 2023-09-30 DIAGNOSIS — S065X0D Traumatic subdural hemorrhage without loss of consciousness, subsequent encounter: Secondary | ICD-10-CM | POA: Diagnosis not present

## 2023-09-30 DIAGNOSIS — I10 Essential (primary) hypertension: Secondary | ICD-10-CM | POA: Diagnosis not present

## 2023-09-30 DIAGNOSIS — Z9181 History of falling: Secondary | ICD-10-CM | POA: Diagnosis not present

## 2023-09-30 DIAGNOSIS — E785 Hyperlipidemia, unspecified: Secondary | ICD-10-CM | POA: Diagnosis not present

## 2023-09-30 DIAGNOSIS — S22089D Unspecified fracture of T11-T12 vertebra, subsequent encounter for fracture with routine healing: Secondary | ICD-10-CM | POA: Diagnosis not present

## 2023-09-30 DIAGNOSIS — S2242XD Multiple fractures of ribs, left side, subsequent encounter for fracture with routine healing: Secondary | ICD-10-CM | POA: Diagnosis not present

## 2023-09-30 DIAGNOSIS — Z8673 Personal history of transient ischemic attack (TIA), and cerebral infarction without residual deficits: Secondary | ICD-10-CM | POA: Diagnosis not present

## 2023-09-30 DIAGNOSIS — G20A1 Parkinson's disease without dyskinesia, without mention of fluctuations: Secondary | ICD-10-CM | POA: Diagnosis not present

## 2023-09-30 DIAGNOSIS — S02401D Maxillary fracture, unspecified, subsequent encounter for fracture with routine healing: Secondary | ICD-10-CM | POA: Diagnosis not present
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
## 2023-09-30 NOTE — Addendum Note (Signed)
Addended by: Flossie Dibble on: 09/30/2023 04:33 PM   Modules accepted: Orders

## 2023-10-01 ENCOUNTER — Telehealth: Payer: Self-pay

## 2023-10-01 ENCOUNTER — Other Ambulatory Visit (HOSPITAL_BASED_OUTPATIENT_CLINIC_OR_DEPARTMENT_OTHER): Payer: Medicare Other | Admitting: Radiology

## 2023-10-01 ENCOUNTER — Ambulatory Visit (INDEPENDENT_AMBULATORY_CARE_PROVIDER_SITE_OTHER)
Admission: RE | Admit: 2023-10-01 | Discharge: 2023-10-01 | Disposition: A | Discharge status: Home / Self Care | Payer: Medicare Other | Source: Ambulatory Visit | Attending: Family Medicine | Admitting: Family Medicine

## 2023-10-01 DIAGNOSIS — R55 Syncope and collapse: Secondary | ICD-10-CM

## 2023-10-01 DIAGNOSIS — S0240DA Maxillary fracture, left side, initial encounter for closed fracture: Secondary | ICD-10-CM | POA: Diagnosis not present

## 2023-10-01 DIAGNOSIS — S62522A Displaced fracture of distal phalanx of left thumb, initial encounter for closed fracture: Secondary | ICD-10-CM | POA: Diagnosis not present

## 2023-10-01 DIAGNOSIS — I6200 Nontraumatic subdural hemorrhage, unspecified: Secondary | ICD-10-CM | POA: Diagnosis not present

## 2023-10-01 DIAGNOSIS — I6782 Cerebral ischemia: Secondary | ICD-10-CM | POA: Diagnosis not present

## 2023-10-01 NOTE — Telephone Encounter (Signed)
Verbal ok given to [facility] for orders below.

## 2023-10-01 NOTE — Telephone Encounter (Signed)
Copied from CRM [id number]. Topic: Clinical - Home Health Verbal Orders >> Oct 01, 2023 11:52 AM Geroge Baseman wrote: Caller/Agency: [facility]/ Eulogio Bear Callback Number: [phone] Service Requested: Speech Therapy Frequency: Had an evaluation scheduled with patient, due to him having multiple appointments they are wanting to reschedule. Move home health speech to next week. Any new concerns about the patient? No

## 2023-10-02 ENCOUNTER — Ambulatory Visit: Payer: Self-pay | Admitting: Family Medicine

## 2023-10-02 NOTE — Telephone Encounter (Signed)
Chief Complaint: Bilateral ankle swelling/rash Symptoms: Bilateral ankle swelling, red rash bilateral ankles Frequency: 4 days Pertinent Negatives: Patient denies difficulty with ambulation Disposition: [] ED /[] Urgent Care (no appt availability in office) / [x] Appointment(In office/virtual)/ []  [place] [facility]/ [] Home Care/ [x] Refused Recommended Disposition /[] [place] [facility]/ []  Follow-up with PCP Additional Notes: Patient called in stating he is experiencing bilateral ankle swelling for 4 days. Patient states he was previously on a diuretic but was feeling better and had his PCP take him off of it 3 months ago. Patient noticed his ankles are moderately swollen for the last 4 days and have a red rash on them. Patient states he was in the office yesterday seeing PCP but forgot to bring this up. Advised patient we recommend a follow up with PCP but patient refuses coming back into office at this time. Patient is asking if PCP can resume his prescription diuretic he was recently on. Patient was also asking for return call about CT results. Please contact patient at [phone]   Copied from CRM [id number]. Topic: Clinical - Red Word Triage >> Oct 02, 2023  1:59 PM Dennison Nancy wrote: Red Word that prompted transfer to Nurse Triage: swelling in both Ankles  Patient provider had patient on a diuretic and was taken off about a month ago  And swelling in both ankles for 3 days Reason for Disposition  MILD or MODERATE ankle swelling (e.g., can't move joint normally, can't do usual activities) (Exceptions: Itchy, localized swelling; swelling is chronic.)  Answer Assessment - Initial Assessment Questions 1. LOCATION: "Which ankle is swollen?" "Where is the swelling?"     Bilateral ankle swelling 2. ONSET: "When did the swelling start?"     4 days ago 3. SWELLING: "How bad is the swelling?" Or, "How large is it?" (e.g., mild, moderate, severe; size of localized swelling)    - NONE: No joint  swelling.   - LOCALIZED: Localized; small area of puffy or swollen skin (e.g., insect bite, skin irritation).   - MILD: Joint looks or feels mildly swollen or puffy.   - MODERATE: Swollen; interferes with normal activities (e.g., work or school); decreased range of movement; may be limping.   - SEVERE: Very swollen; can't move swollen joint at all; limping a lot or unable to walk.     "Swollen pretty tight and rash developed on both" 4. PAIN: "Is there any pain?" If Yes, ask: "How bad is it?" (Scale 1-10; or mild, moderate, severe)   - NONE (0): no pain.   - MILD (1-3): doesn't interfere with normal activities.    - MODERATE (4-7): interferes with normal activities (e.g., work or school) or awakens from sleep, limping.    - SEVERE (8-10): excruciating pain, unable to do any normal activities, unable to walk.      No 5. CAUSE: "What do you think caused the ankle swelling?"     Being taken off of the diuretic 6. OTHER SYMPTOMS: "Do you have any other symptoms?" (e.g., fever, chest pain, difficulty breathing, calf pain)     Red rash on both ankles  Protocols used: Ankle Swelling-A-AH

## 2023-10-02 NOTE — Telephone Encounter (Signed)
Call patient and ask which medication he was taking for fluid.  The only 1 I can find is furosemide.  If this is what he looks though do 20 mg daily.

## 2023-10-03 DIAGNOSIS — S02401D Maxillary fracture, unspecified, subsequent encounter for fracture with routine healing: Secondary | ICD-10-CM | POA: Diagnosis not present

## 2023-10-03 DIAGNOSIS — Z8673 Personal history of transient ischemic attack (TIA), and cerebral infarction without residual deficits: Secondary | ICD-10-CM | POA: Diagnosis not present

## 2023-10-03 DIAGNOSIS — Z791 Long term (current) use of non-steroidal anti-inflammatories (NSAID): Secondary | ICD-10-CM | POA: Diagnosis not present

## 2023-10-03 DIAGNOSIS — G2581 Restless legs syndrome: Secondary | ICD-10-CM | POA: Diagnosis not present

## 2023-10-03 DIAGNOSIS — I10 Essential (primary) hypertension: Secondary | ICD-10-CM | POA: Diagnosis not present

## 2023-10-03 DIAGNOSIS — S22089D Unspecified fracture of T11-T12 vertebra, subsequent encounter for fracture with routine healing: Secondary | ICD-10-CM | POA: Diagnosis not present

## 2023-10-03 DIAGNOSIS — S2242XD Multiple fractures of ribs, left side, subsequent encounter for fracture with routine healing: Secondary | ICD-10-CM | POA: Diagnosis not present

## 2023-10-03 DIAGNOSIS — S62502D Fracture of unspecified phalanx of left thumb, subsequent encounter for fracture with routine healing: Secondary | ICD-10-CM | POA: Diagnosis not present

## 2023-10-03 DIAGNOSIS — R498 Other voice and resonance disorders: Secondary | ICD-10-CM | POA: Diagnosis not present

## 2023-10-03 DIAGNOSIS — S065X0D Traumatic subdural hemorrhage without loss of consciousness, subsequent encounter: Secondary | ICD-10-CM | POA: Diagnosis not present

## 2023-10-03 DIAGNOSIS — G20A1 Parkinson's disease without dyskinesia, without mention of fluctuations: Secondary | ICD-10-CM | POA: Diagnosis not present

## 2023-10-03 DIAGNOSIS — Z9181 History of falling: Secondary | ICD-10-CM | POA: Diagnosis not present

## 2023-10-03 DIAGNOSIS — D62 Acute posthemorrhagic anemia: Secondary | ICD-10-CM | POA: Diagnosis not present

## 2023-10-03 DIAGNOSIS — E785 Hyperlipidemia, unspecified: Secondary | ICD-10-CM | POA: Diagnosis not present

## 2023-10-03 NOTE — Telephone Encounter (Signed)
 Left message for patient to call our office.

## 2023-10-06 ENCOUNTER — Ambulatory Visit: Payer: Medicare Other | Admitting: Neurology

## 2023-10-06 ENCOUNTER — Other Ambulatory Visit: Payer: Self-pay | Admitting: Family Medicine

## 2023-10-06 ENCOUNTER — Encounter: Payer: Self-pay | Admitting: Neurology

## 2023-10-06 VITALS — BP 116/62 | HR 71 | Wt 163.0 lb

## 2023-10-06 DIAGNOSIS — F331 Major depressive disorder, recurrent, moderate: Secondary | ICD-10-CM

## 2023-10-06 DIAGNOSIS — G20B1 Parkinson's disease with dyskinesia, without mention of fluctuations: Secondary | ICD-10-CM | POA: Diagnosis not present

## 2023-10-06 DIAGNOSIS — S065XAA Traumatic subdural hemorrhage with loss of consciousness status unknown, initial encounter: Secondary | ICD-10-CM

## 2023-10-06 MED ORDER — AMANTADINE HCL 100 MG PO CAPS: ORAL_CAPSULE | ORAL | Status: DC

## 2023-10-06 MED ORDER — FUROSEMIDE 20 MG PO TABS: 20.0000 mg | ORAL_TABLET | Freq: Every day | ORAL | 3 refills | Status: AC
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
## 2023-10-06 NOTE — Patient Instructions (Addendum)
Drop amantadine to 1 capsule at 1pm.  This is decreased from 1 at 9am and 1 at 1pm.    APPOINTMENTS  ENT: Follow up at Outpatient Trauma ENT Clinic after discharge. You will be called with an appointment date/time. Please call ENT if you do not receive an appointment date/time within a few days of discharge [phone]).  ORTHOPEDICS: Follow up at Outpatient Orthopedic Clinic after discharge. You will be called with an appointment date/time. Please call Orthopedics if you do not receive an appointment date/time within a few days of discharge ([phone]).  TRAUMA: Follow up at Outpatient Trauma Clinic /// Outpatient Trauma Tuesday Afternoon Clinic 2 weeks after discharge. You will be called with an appointment date/time. Please call Trauma if you do not receive an appointment date/time within a few days of discharge ([phone]).  PRIMARY CARE PROVIDER: Please follow-up with your primary care provider within 1-2 weeks after discharge for past and future medical conditions and current medical management. If you do not have one, you are strongly encouraged to find a primary care provider to manage your health. If you are unable to establish one, please call [facility] at [phone].  Scheduled Future Appointments   Provider Department Dept Phone Center  09/29/2023 3:00 PM Benn [place] - Orthopedics RESI MPM [phone] [facility] [place]     SURGERY CONTACT TELEPHONE NUMBERS Trauma / Surgery Nursing Triage (Monday-Friday daytime working hours) [phone] Trauma / Surgery Clinic Schedulers (Monday-Friday daytime working hours) [phone] Discharge Hospital assistance line any time twenty four hours a day seven days a week at [phone]. [facility] Operator [phone]  Please call with any questions to the following contact numbers: For Trauma Service [phone], for Orthopedics [phone], For Neurosurgery [phone], For  Plastics [phone], For ENT [phone], and for Dentistry [phone]. You can also call Discharge Hospital assistance line any time twenty four hours a day seven days a week at [phone].

## 2023-10-06 NOTE — Assessment & Plan Note (Signed)
SECONDARY TO POTHOLE IN SIDEWALK PER PATIENT.

## 2023-10-07 ENCOUNTER — Telehealth: Payer: Self-pay

## 2023-10-07 DIAGNOSIS — G20A1 Parkinson's disease without dyskinesia, without mention of fluctuations: Secondary | ICD-10-CM | POA: Diagnosis not present

## 2023-10-07 DIAGNOSIS — G2581 Restless legs syndrome: Secondary | ICD-10-CM | POA: Diagnosis not present

## 2023-10-07 DIAGNOSIS — S22089D Unspecified fracture of T11-T12 vertebra, subsequent encounter for fracture with routine healing: Secondary | ICD-10-CM | POA: Diagnosis not present

## 2023-10-07 DIAGNOSIS — S065X0D Traumatic subdural hemorrhage without loss of consciousness, subsequent encounter: Secondary | ICD-10-CM | POA: Diagnosis not present

## 2023-10-07 DIAGNOSIS — R498 Other voice and resonance disorders: Secondary | ICD-10-CM | POA: Diagnosis not present

## 2023-10-07 DIAGNOSIS — D62 Acute posthemorrhagic anemia: Secondary | ICD-10-CM | POA: Diagnosis not present

## 2023-10-07 DIAGNOSIS — Z9181 History of falling: Secondary | ICD-10-CM | POA: Diagnosis not present

## 2023-10-07 DIAGNOSIS — S62502D Fracture of unspecified phalanx of left thumb, subsequent encounter for fracture with routine healing: Secondary | ICD-10-CM | POA: Diagnosis not present

## 2023-10-07 DIAGNOSIS — S02401D Maxillary fracture, unspecified, subsequent encounter for fracture with routine healing: Secondary | ICD-10-CM | POA: Diagnosis not present

## 2023-10-07 DIAGNOSIS — I10 Essential (primary) hypertension: Secondary | ICD-10-CM | POA: Diagnosis not present

## 2023-10-07 DIAGNOSIS — S2242XD Multiple fractures of ribs, left side, subsequent encounter for fracture with routine healing: Secondary | ICD-10-CM | POA: Diagnosis not present

## 2023-10-07 DIAGNOSIS — E785 Hyperlipidemia, unspecified: Secondary | ICD-10-CM | POA: Diagnosis not present

## 2023-10-07 DIAGNOSIS — Z791 Long term (current) use of non-steroidal anti-inflammatories (NSAID): Secondary | ICD-10-CM | POA: Diagnosis not present

## 2023-10-07 DIAGNOSIS — Z8673 Personal history of transient ischemic attack (TIA), and cerebral infarction without residual deficits: Secondary | ICD-10-CM | POA: Diagnosis not present
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
## 2023-10-07 NOTE — Telephone Encounter (Signed)
A referral order is needed for ENT.

## 2023-10-08 DIAGNOSIS — Z961 Presence of intraocular lens: Secondary | ICD-10-CM | POA: Diagnosis not present

## 2023-10-08 DIAGNOSIS — H52203 Unspecified astigmatism, bilateral: Secondary | ICD-10-CM | POA: Diagnosis not present

## 2023-10-09 DIAGNOSIS — R498 Other voice and resonance disorders: Secondary | ICD-10-CM | POA: Diagnosis not present

## 2023-10-09 DIAGNOSIS — G20A1 Parkinson's disease without dyskinesia, without mention of fluctuations: Secondary | ICD-10-CM | POA: Diagnosis not present

## 2023-10-09 DIAGNOSIS — Z9181 History of falling: Secondary | ICD-10-CM | POA: Diagnosis not present

## 2023-10-09 DIAGNOSIS — Z8673 Personal history of transient ischemic attack (TIA), and cerebral infarction without residual deficits: Secondary | ICD-10-CM | POA: Diagnosis not present

## 2023-10-09 DIAGNOSIS — D62 Acute posthemorrhagic anemia: Secondary | ICD-10-CM | POA: Diagnosis not present

## 2023-10-09 DIAGNOSIS — S02401D Maxillary fracture, unspecified, subsequent encounter for fracture with routine healing: Secondary | ICD-10-CM | POA: Diagnosis not present

## 2023-10-09 DIAGNOSIS — S62502D Fracture of unspecified phalanx of left thumb, subsequent encounter for fracture with routine healing: Secondary | ICD-10-CM | POA: Diagnosis not present

## 2023-10-09 DIAGNOSIS — S22089D Unspecified fracture of T11-T12 vertebra, subsequent encounter for fracture with routine healing: Secondary | ICD-10-CM | POA: Diagnosis not present

## 2023-10-09 DIAGNOSIS — S2242XD Multiple fractures of ribs, left side, subsequent encounter for fracture with routine healing: Secondary | ICD-10-CM | POA: Diagnosis not present

## 2023-10-09 DIAGNOSIS — E785 Hyperlipidemia, unspecified: Secondary | ICD-10-CM | POA: Diagnosis not present

## 2023-10-09 DIAGNOSIS — G2581 Restless legs syndrome: Secondary | ICD-10-CM | POA: Diagnosis not present

## 2023-10-09 DIAGNOSIS — Z791 Long term (current) use of non-steroidal anti-inflammatories (NSAID): Secondary | ICD-10-CM | POA: Diagnosis not present

## 2023-10-09 DIAGNOSIS — S065X0D Traumatic subdural hemorrhage without loss of consciousness, subsequent encounter: Secondary | ICD-10-CM | POA: Diagnosis not present

## 2023-10-09 DIAGNOSIS — I10 Essential (primary) hypertension: Secondary | ICD-10-CM | POA: Diagnosis not present

## 2023-10-09 NOTE — Telephone Encounter (Signed)
 I would recommend the patient call the trauma ENT at [facility] that is listed in his discharge summary.  He was seen in the hospital so should be seen more quickly. Dr. Reinhold Carbine

## 2023-10-09 NOTE — Telephone Encounter (Signed)
Called patient left detailed  message  °

## 2023-10-10 DIAGNOSIS — S2242XD Multiple fractures of ribs, left side, subsequent encounter for fracture with routine healing: Secondary | ICD-10-CM | POA: Diagnosis not present

## 2023-10-10 DIAGNOSIS — G2581 Restless legs syndrome: Secondary | ICD-10-CM | POA: Diagnosis not present

## 2023-10-10 DIAGNOSIS — E785 Hyperlipidemia, unspecified: Secondary | ICD-10-CM | POA: Diagnosis not present

## 2023-10-10 DIAGNOSIS — S065X0D Traumatic subdural hemorrhage without loss of consciousness, subsequent encounter: Secondary | ICD-10-CM | POA: Diagnosis not present

## 2023-10-10 DIAGNOSIS — G20A1 Parkinson's disease without dyskinesia, without mention of fluctuations: Secondary | ICD-10-CM | POA: Diagnosis not present

## 2023-10-10 DIAGNOSIS — Z9181 History of falling: Secondary | ICD-10-CM | POA: Diagnosis not present

## 2023-10-10 DIAGNOSIS — S02401D Maxillary fracture, unspecified, subsequent encounter for fracture with routine healing: Secondary | ICD-10-CM | POA: Diagnosis not present

## 2023-10-10 DIAGNOSIS — Z791 Long term (current) use of non-steroidal anti-inflammatories (NSAID): Secondary | ICD-10-CM | POA: Diagnosis not present

## 2023-10-10 DIAGNOSIS — D62 Acute posthemorrhagic anemia: Secondary | ICD-10-CM | POA: Diagnosis not present

## 2023-10-10 DIAGNOSIS — R498 Other voice and resonance disorders: Secondary | ICD-10-CM | POA: Diagnosis not present

## 2023-10-10 DIAGNOSIS — S22089D Unspecified fracture of T11-T12 vertebra, subsequent encounter for fracture with routine healing: Secondary | ICD-10-CM | POA: Diagnosis not present

## 2023-10-10 DIAGNOSIS — S62502D Fracture of unspecified phalanx of left thumb, subsequent encounter for fracture with routine healing: Secondary | ICD-10-CM | POA: Diagnosis not present

## 2023-10-10 DIAGNOSIS — Z8673 Personal history of transient ischemic attack (TIA), and cerebral infarction without residual deficits: Secondary | ICD-10-CM | POA: Diagnosis not present

## 2023-10-10 DIAGNOSIS — I10 Essential (primary) hypertension: Secondary | ICD-10-CM | POA: Diagnosis not present

## 2023-10-13 ENCOUNTER — Other Ambulatory Visit: Payer: Self-pay

## 2023-10-13 ENCOUNTER — Telehealth: Payer: Self-pay | Admitting: Neurology

## 2023-10-13 ENCOUNTER — Telehealth: Payer: Self-pay | Admitting: Family Medicine

## 2023-10-13 ENCOUNTER — Telehealth: Payer: Self-pay

## 2023-10-13 DIAGNOSIS — I10 Essential (primary) hypertension: Secondary | ICD-10-CM

## 2023-10-13 DIAGNOSIS — S02401D Maxillary fracture, unspecified, subsequent encounter for fracture with routine healing: Secondary | ICD-10-CM

## 2023-10-13 DIAGNOSIS — S065X0D Traumatic subdural hemorrhage without loss of consciousness, subsequent encounter: Secondary | ICD-10-CM

## 2023-10-13 DIAGNOSIS — G20A1 Parkinson's disease without dyskinesia, without mention of fluctuations: Secondary | ICD-10-CM

## 2023-10-13 DIAGNOSIS — N400 Enlarged prostate without lower urinary tract symptoms: Secondary | ICD-10-CM

## 2023-10-13 DIAGNOSIS — S22089D Unspecified fracture of T11-T12 vertebra, subsequent encounter for fracture with routine healing: Secondary | ICD-10-CM

## 2023-10-13 DIAGNOSIS — E785 Hyperlipidemia, unspecified: Secondary | ICD-10-CM

## 2023-10-13 DIAGNOSIS — D62 Acute posthemorrhagic anemia: Secondary | ICD-10-CM

## 2023-10-13 DIAGNOSIS — S62502D Fracture of unspecified phalanx of left thumb, subsequent encounter for fracture with routine healing: Secondary | ICD-10-CM

## 2023-10-13 DIAGNOSIS — S065XAA Traumatic subdural hemorrhage with loss of consciousness status unknown, initial encounter: Secondary | ICD-10-CM

## 2023-10-13 DIAGNOSIS — G2581 Restless legs syndrome: Secondary | ICD-10-CM

## 2023-10-13 DIAGNOSIS — F32A Depression, unspecified: Secondary | ICD-10-CM

## 2023-10-13 DIAGNOSIS — S2242XD Multiple fractures of ribs, left side, subsequent encounter for fracture with routine healing: Secondary | ICD-10-CM

## 2023-10-13 NOTE — Telephone Encounter (Signed)
 Call patient and make sure that he has f/u with neurosurgery like we discussed at our visit.  That brain bleed is a bit better but still significant and needs f/u with the person who saw him at the hospital.  The phone numbers are on his AVS.

## 2023-10-13 NOTE — Telephone Encounter (Signed)
 [facility] CERTIFICATION AND POC  - ORDER #[id number]

## 2023-10-13 NOTE — Telephone Encounter (Signed)
 THIS FORM WAS PUT IN DR. COXS BOX

## 2023-10-13 NOTE — Telephone Encounter (Signed)
 VO given to [facility] for orders below.  Copied from CRM [id number]. 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  Any new concerns about the patient? No

## 2023-10-13 NOTE — Telephone Encounter (Signed)
 Called patient and he has not made that appointment he doesn't want to go to the Dr they suggested in [place]. I told patient he can ask for a new referral to [facility] here in [place] but he does need to be seen

## 2023-10-13 NOTE — Telephone Encounter (Signed)
 Referral sent

## 2023-10-14 ENCOUNTER — Telehealth: Payer: Self-pay | Admitting: Neurology

## 2023-10-14 DIAGNOSIS — D62 Acute posthemorrhagic anemia: Secondary | ICD-10-CM | POA: Diagnosis not present

## 2023-10-14 DIAGNOSIS — G2581 Restless legs syndrome: Secondary | ICD-10-CM | POA: Diagnosis not present

## 2023-10-14 DIAGNOSIS — Z791 Long term (current) use of non-steroidal anti-inflammatories (NSAID): Secondary | ICD-10-CM | POA: Diagnosis not present

## 2023-10-14 DIAGNOSIS — S065X0D Traumatic subdural hemorrhage without loss of consciousness, subsequent encounter: Secondary | ICD-10-CM | POA: Diagnosis not present

## 2023-10-14 DIAGNOSIS — Z8673 Personal history of transient ischemic attack (TIA), and cerebral infarction without residual deficits: Secondary | ICD-10-CM | POA: Diagnosis not present

## 2023-10-14 DIAGNOSIS — R498 Other voice and resonance disorders: Secondary | ICD-10-CM | POA: Diagnosis not present

## 2023-10-14 DIAGNOSIS — S22089D Unspecified fracture of T11-T12 vertebra, subsequent encounter for fracture with routine healing: Secondary | ICD-10-CM | POA: Diagnosis not present

## 2023-10-14 DIAGNOSIS — S2242XD Multiple fractures of ribs, left side, subsequent encounter for fracture with routine healing: Secondary | ICD-10-CM | POA: Diagnosis not present

## 2023-10-14 DIAGNOSIS — G20A1 Parkinson's disease without dyskinesia, without mention of fluctuations: Secondary | ICD-10-CM | POA: Diagnosis not present

## 2023-10-14 DIAGNOSIS — S62502D Fracture of unspecified phalanx of left thumb, subsequent encounter for fracture with routine healing: Secondary | ICD-10-CM | POA: Diagnosis not present

## 2023-10-14 DIAGNOSIS — Z9181 History of falling: Secondary | ICD-10-CM | POA: Diagnosis not present

## 2023-10-14 DIAGNOSIS — I10 Essential (primary) hypertension: Secondary | ICD-10-CM | POA: Diagnosis not present

## 2023-10-14 DIAGNOSIS — E785 Hyperlipidemia, unspecified: Secondary | ICD-10-CM | POA: Diagnosis not present

## 2023-10-14 DIAGNOSIS — S02401D Maxillary fracture, unspecified, subsequent encounter for fracture with routine healing: Secondary | ICD-10-CM | POA: Diagnosis not present

## 2023-10-14 NOTE — Telephone Encounter (Signed)
Pt called in stating he is confused about his referral. He says someone is supposed to be referring him to [facility]. I let him know Dr. Arbutus Leas referred him. He was satisfied.

## 2023-10-15 ENCOUNTER — Ambulatory Visit: Payer: Medicare Other | Attending: Cardiology

## 2023-10-15 DIAGNOSIS — R55 Syncope and collapse: Secondary | ICD-10-CM

## 2023-10-15 DIAGNOSIS — I2510 Atherosclerotic heart disease of native coronary artery without angina pectoris: Secondary | ICD-10-CM | POA: Diagnosis not present

## 2023-10-15 LAB — ECHOCARDIOGRAM COMPLETE
AV Mean grad: 4.2 mmHg
AV Peak grad: 7.9 mmHg
Ao pk vel: 1.41 m/s
Area-P 1/2: 2.99 cm2
S' Lateral: 3 cm

## 2023-10-16 ENCOUNTER — Telehealth: Payer: Self-pay

## 2023-10-16 DIAGNOSIS — Z129 Encounter for screening for malignant neoplasm, site unspecified: Secondary | ICD-10-CM | POA: Diagnosis not present

## 2023-10-16 NOTE — Telephone Encounter (Signed)
Copied from CRM [id number]. Topic: General - Inquiry >> Oct 16, 2023  2:39 PM Higinio Roger wrote: Eulogio Bear from [facility] called to notify the clinic that there was a miscommunication with the patient. Patient had an appointment causing him to miss his speech therapy. Callback number: [phone]

## 2023-10-16 NOTE — Telephone Encounter (Signed)
Spoke with Elnita Maxwell and she was calling to inform the office that Pt's visit with speech therapy was a miss visit on yesterday due to the Pt having a doctor's appointment and she is unable to move that visit this week. She is scheduled to see the Pt again on 2/19.

## 2023-10-21 ENCOUNTER — Telehealth: Payer: Self-pay | Admitting: Emergency Medicine

## 2023-10-21 DIAGNOSIS — E785 Hyperlipidemia, unspecified: Secondary | ICD-10-CM | POA: Diagnosis not present

## 2023-10-21 DIAGNOSIS — Z8673 Personal history of transient ischemic attack (TIA), and cerebral infarction without residual deficits: Secondary | ICD-10-CM | POA: Diagnosis not present

## 2023-10-21 DIAGNOSIS — S065X0D Traumatic subdural hemorrhage without loss of consciousness, subsequent encounter: Secondary | ICD-10-CM | POA: Diagnosis not present

## 2023-10-21 DIAGNOSIS — S22089D Unspecified fracture of T11-T12 vertebra, subsequent encounter for fracture with routine healing: Secondary | ICD-10-CM | POA: Diagnosis not present

## 2023-10-21 DIAGNOSIS — G20A1 Parkinson's disease without dyskinesia, without mention of fluctuations: Secondary | ICD-10-CM | POA: Diagnosis not present

## 2023-10-21 DIAGNOSIS — Z9181 History of falling: Secondary | ICD-10-CM | POA: Diagnosis not present

## 2023-10-21 DIAGNOSIS — G2581 Restless legs syndrome: Secondary | ICD-10-CM | POA: Diagnosis not present

## 2023-10-21 DIAGNOSIS — S2242XD Multiple fractures of ribs, left side, subsequent encounter for fracture with routine healing: Secondary | ICD-10-CM | POA: Diagnosis not present

## 2023-10-21 DIAGNOSIS — I10 Essential (primary) hypertension: Secondary | ICD-10-CM | POA: Diagnosis not present

## 2023-10-21 DIAGNOSIS — D62 Acute posthemorrhagic anemia: Secondary | ICD-10-CM | POA: Diagnosis not present

## 2023-10-21 DIAGNOSIS — R498 Other voice and resonance disorders: Secondary | ICD-10-CM | POA: Diagnosis not present

## 2023-10-21 DIAGNOSIS — S62502D Fracture of unspecified phalanx of left thumb, subsequent encounter for fracture with routine healing: Secondary | ICD-10-CM | POA: Diagnosis not present

## 2023-10-21 DIAGNOSIS — S02401D Maxillary fracture, unspecified, subsequent encounter for fracture with routine healing: Secondary | ICD-10-CM | POA: Diagnosis not present

## 2023-10-21 DIAGNOSIS — Z791 Long term (current) use of non-steroidal anti-inflammatories (NSAID): Secondary | ICD-10-CM | POA: Diagnosis not present

## 2023-10-21 NOTE — Telephone Encounter (Signed)
-----   Message from Flossie Dibble sent at 10/15/2023  3:11 PM EST ----- Echocardiogram does not show anything concerning.  We are looking for possible causes for his syncopal episode.  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  We need to make sure he is following up with a neurosurgeon, and find out who that is so we can send a copy of the CT of his head to them.

## 2023-10-21 NOTE — Telephone Encounter (Signed)
 Called and spoke to patient, who reports that Dr. Arbutus Leas (neurologist) has placed a referral for neurosurgery, but he does not have an appointment yet. Advised patient to inform us when he has an appointment so we can send CT results. Patient verbalized understanding and had no further questions.

## 2023-10-22 DIAGNOSIS — S22089D Unspecified fracture of T11-T12 vertebra, subsequent encounter for fracture with routine healing: Secondary | ICD-10-CM | POA: Diagnosis not present

## 2023-10-22 DIAGNOSIS — Z9181 History of falling: Secondary | ICD-10-CM | POA: Diagnosis not present

## 2023-10-22 DIAGNOSIS — Z791 Long term (current) use of non-steroidal anti-inflammatories (NSAID): Secondary | ICD-10-CM | POA: Diagnosis not present

## 2023-10-22 DIAGNOSIS — G20A1 Parkinson's disease without dyskinesia, without mention of fluctuations: Secondary | ICD-10-CM | POA: Diagnosis not present

## 2023-10-22 DIAGNOSIS — R498 Other voice and resonance disorders: Secondary | ICD-10-CM | POA: Diagnosis not present

## 2023-10-22 DIAGNOSIS — S2242XD Multiple fractures of ribs, left side, subsequent encounter for fracture with routine healing: Secondary | ICD-10-CM | POA: Diagnosis not present

## 2023-10-22 DIAGNOSIS — S065X0D Traumatic subdural hemorrhage without loss of consciousness, subsequent encounter: Secondary | ICD-10-CM | POA: Diagnosis not present

## 2023-10-22 DIAGNOSIS — G2581 Restless legs syndrome: Secondary | ICD-10-CM | POA: Diagnosis not present

## 2023-10-22 DIAGNOSIS — Z8673 Personal history of transient ischemic attack (TIA), and cerebral infarction without residual deficits: Secondary | ICD-10-CM | POA: Diagnosis not present

## 2023-10-22 DIAGNOSIS — E785 Hyperlipidemia, unspecified: Secondary | ICD-10-CM | POA: Diagnosis not present

## 2023-10-22 DIAGNOSIS — S02401D Maxillary fracture, unspecified, subsequent encounter for fracture with routine healing: Secondary | ICD-10-CM | POA: Diagnosis not present

## 2023-10-22 DIAGNOSIS — D62 Acute posthemorrhagic anemia: Secondary | ICD-10-CM | POA: Diagnosis not present

## 2023-10-22 DIAGNOSIS — I10 Essential (primary) hypertension: Secondary | ICD-10-CM | POA: Diagnosis not present

## 2023-10-22 DIAGNOSIS — S62502D Fracture of unspecified phalanx of left thumb, subsequent encounter for fracture with routine healing: Secondary | ICD-10-CM | POA: Diagnosis not present

## 2023-10-25 ENCOUNTER — Other Ambulatory Visit: Payer: Self-pay | Admitting: Psychiatry

## 2023-10-27 ENCOUNTER — Other Ambulatory Visit: Payer: Self-pay | Admitting: Neurology

## 2023-10-27 DIAGNOSIS — G20B1 Parkinson's disease with dyskinesia, without mention of fluctuations: Secondary | ICD-10-CM

## 2023-10-30 ENCOUNTER — Other Ambulatory Visit (HOSPITAL_COMMUNITY): Payer: Self-pay | Admitting: Neurosurgery

## 2023-10-30 DIAGNOSIS — S065XAA Traumatic subdural hemorrhage with loss of consciousness status unknown, initial encounter: Secondary | ICD-10-CM

## 2023-10-31 DIAGNOSIS — Z9181 History of falling: Secondary | ICD-10-CM | POA: Diagnosis not present

## 2023-10-31 DIAGNOSIS — D62 Acute posthemorrhagic anemia: Secondary | ICD-10-CM | POA: Diagnosis not present

## 2023-10-31 DIAGNOSIS — G2581 Restless legs syndrome: Secondary | ICD-10-CM | POA: Diagnosis not present

## 2023-10-31 DIAGNOSIS — Z8673 Personal history of transient ischemic attack (TIA), and cerebral infarction without residual deficits: Secondary | ICD-10-CM | POA: Diagnosis not present

## 2023-10-31 DIAGNOSIS — I10 Essential (primary) hypertension: Secondary | ICD-10-CM | POA: Diagnosis not present

## 2023-10-31 DIAGNOSIS — G20A1 Parkinson's disease without dyskinesia, without mention of fluctuations: Secondary | ICD-10-CM | POA: Diagnosis not present

## 2023-10-31 DIAGNOSIS — S22089D Unspecified fracture of T11-T12 vertebra, subsequent encounter for fracture with routine healing: Secondary | ICD-10-CM | POA: Diagnosis not present

## 2023-10-31 DIAGNOSIS — S2242XD Multiple fractures of ribs, left side, subsequent encounter for fracture with routine healing: Secondary | ICD-10-CM | POA: Diagnosis not present

## 2023-10-31 DIAGNOSIS — E785 Hyperlipidemia, unspecified: Secondary | ICD-10-CM | POA: Diagnosis not present

## 2023-10-31 DIAGNOSIS — R498 Other voice and resonance disorders: Secondary | ICD-10-CM | POA: Diagnosis not present

## 2023-10-31 DIAGNOSIS — S62502D Fracture of unspecified phalanx of left thumb, subsequent encounter for fracture with routine healing: Secondary | ICD-10-CM | POA: Diagnosis not present

## 2023-10-31 DIAGNOSIS — S065X0D Traumatic subdural hemorrhage without loss of consciousness, subsequent encounter: Secondary | ICD-10-CM | POA: Diagnosis not present

## 2023-10-31 DIAGNOSIS — Z791 Long term (current) use of non-steroidal anti-inflammatories (NSAID): Secondary | ICD-10-CM | POA: Diagnosis not present

## 2023-10-31 DIAGNOSIS — S02401D Maxillary fracture, unspecified, subsequent encounter for fracture with routine healing: Secondary | ICD-10-CM | POA: Diagnosis not present

## 2023-11-04 ENCOUNTER — Ambulatory Visit (HOSPITAL_COMMUNITY): Payer: Medicare Other

## 2023-11-04 ENCOUNTER — Encounter (HOSPITAL_COMMUNITY): Payer: Self-pay

## 2023-11-05 DIAGNOSIS — D62 Acute posthemorrhagic anemia: Secondary | ICD-10-CM | POA: Diagnosis not present

## 2023-11-05 DIAGNOSIS — R498 Other voice and resonance disorders: Secondary | ICD-10-CM | POA: Diagnosis not present

## 2023-11-05 DIAGNOSIS — S2242XD Multiple fractures of ribs, left side, subsequent encounter for fracture with routine healing: Secondary | ICD-10-CM | POA: Diagnosis not present

## 2023-11-05 DIAGNOSIS — Z8673 Personal history of transient ischemic attack (TIA), and cerebral infarction without residual deficits: Secondary | ICD-10-CM | POA: Diagnosis not present

## 2023-11-05 DIAGNOSIS — S065X0D Traumatic subdural hemorrhage without loss of consciousness, subsequent encounter: Secondary | ICD-10-CM | POA: Diagnosis not present

## 2023-11-05 DIAGNOSIS — G2581 Restless legs syndrome: Secondary | ICD-10-CM | POA: Diagnosis not present

## 2023-11-05 DIAGNOSIS — S02401D Maxillary fracture, unspecified, subsequent encounter for fracture with routine healing: Secondary | ICD-10-CM | POA: Diagnosis not present

## 2023-11-05 DIAGNOSIS — Z9181 History of falling: Secondary | ICD-10-CM | POA: Diagnosis not present

## 2023-11-05 DIAGNOSIS — Z791 Long term (current) use of non-steroidal anti-inflammatories (NSAID): Secondary | ICD-10-CM | POA: Diagnosis not present

## 2023-11-05 DIAGNOSIS — G20A1 Parkinson's disease without dyskinesia, without mention of fluctuations: Secondary | ICD-10-CM | POA: Diagnosis not present

## 2023-11-05 DIAGNOSIS — S62502D Fracture of unspecified phalanx of left thumb, subsequent encounter for fracture with routine healing: Secondary | ICD-10-CM | POA: Diagnosis not present

## 2023-11-05 DIAGNOSIS — E785 Hyperlipidemia, unspecified: Secondary | ICD-10-CM | POA: Diagnosis not present

## 2023-11-05 DIAGNOSIS — I10 Essential (primary) hypertension: Secondary | ICD-10-CM | POA: Diagnosis not present

## 2023-11-05 DIAGNOSIS — S22089D Unspecified fracture of T11-T12 vertebra, subsequent encounter for fracture with routine healing: Secondary | ICD-10-CM | POA: Diagnosis not present

## 2023-11-11 ENCOUNTER — Encounter (HOSPITAL_COMMUNITY): Payer: Self-pay

## 2023-11-11 ENCOUNTER — Ambulatory Visit (HOSPITAL_COMMUNITY)

## 2023-11-11 ENCOUNTER — Ambulatory Visit (HOSPITAL_COMMUNITY)
Admission: RE | Admit: 2023-11-11 | Discharge: 2023-11-11 | Disposition: A | Discharge status: Home / Self Care | Source: Ambulatory Visit | Attending: Neurosurgery | Admitting: Neurosurgery

## 2023-11-11 DIAGNOSIS — S065XAA Traumatic subdural hemorrhage with loss of consciousness status unknown, initial encounter: Secondary | ICD-10-CM | POA: Insufficient documentation

## 2023-11-11 DIAGNOSIS — I6200 Nontraumatic subdural hemorrhage, unspecified: Secondary | ICD-10-CM | POA: Diagnosis not present

## 2023-11-12 DIAGNOSIS — G20A1 Parkinson's disease without dyskinesia, without mention of fluctuations: Secondary | ICD-10-CM | POA: Diagnosis not present

## 2023-11-12 DIAGNOSIS — I10 Essential (primary) hypertension: Secondary | ICD-10-CM | POA: Diagnosis not present

## 2023-11-12 DIAGNOSIS — Z9181 History of falling: Secondary | ICD-10-CM | POA: Diagnosis not present

## 2023-11-12 DIAGNOSIS — G2581 Restless legs syndrome: Secondary | ICD-10-CM | POA: Diagnosis not present

## 2023-11-12 DIAGNOSIS — D62 Acute posthemorrhagic anemia: Secondary | ICD-10-CM | POA: Diagnosis not present

## 2023-11-12 DIAGNOSIS — Z8673 Personal history of transient ischemic attack (TIA), and cerebral infarction without residual deficits: Secondary | ICD-10-CM | POA: Diagnosis not present

## 2023-11-12 DIAGNOSIS — S22089D Unspecified fracture of T11-T12 vertebra, subsequent encounter for fracture with routine healing: Secondary | ICD-10-CM | POA: Diagnosis not present

## 2023-11-12 DIAGNOSIS — S62502D Fracture of unspecified phalanx of left thumb, subsequent encounter for fracture with routine healing: Secondary | ICD-10-CM | POA: Diagnosis not present

## 2023-11-12 DIAGNOSIS — E785 Hyperlipidemia, unspecified: Secondary | ICD-10-CM | POA: Diagnosis not present

## 2023-11-12 DIAGNOSIS — Z791 Long term (current) use of non-steroidal anti-inflammatories (NSAID): Secondary | ICD-10-CM | POA: Diagnosis not present

## 2023-11-12 DIAGNOSIS — S2242XD Multiple fractures of ribs, left side, subsequent encounter for fracture with routine healing: Secondary | ICD-10-CM | POA: Diagnosis not present

## 2023-11-12 DIAGNOSIS — S065X0D Traumatic subdural hemorrhage without loss of consciousness, subsequent encounter: Secondary | ICD-10-CM | POA: Diagnosis not present

## 2023-11-12 DIAGNOSIS — R498 Other voice and resonance disorders: Secondary | ICD-10-CM | POA: Diagnosis not present

## 2023-11-12 DIAGNOSIS — S02401D Maxillary fracture, unspecified, subsequent encounter for fracture with routine healing: Secondary | ICD-10-CM | POA: Diagnosis not present
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
## 2023-11-20 ENCOUNTER — Ambulatory Visit: Payer: Medicare Other | Attending: Cardiology | Admitting: Cardiology

## 2023-11-20 ENCOUNTER — Other Ambulatory Visit: Payer: Self-pay | Admitting: Neurology

## 2023-11-20 ENCOUNTER — Encounter: Payer: Self-pay | Admitting: Cardiology

## 2023-11-20 VITALS — BP 118/78 | HR 70 | Ht 74.0 in | Wt 159.4 lb

## 2023-11-20 DIAGNOSIS — Z953 Presence of xenogenic heart valve: Secondary | ICD-10-CM

## 2023-11-20 DIAGNOSIS — G20A1 Parkinson's disease without dyskinesia, without mention of fluctuations: Secondary | ICD-10-CM

## 2023-11-20 DIAGNOSIS — I10 Essential (primary) hypertension: Secondary | ICD-10-CM | POA: Diagnosis not present

## 2023-11-20 DIAGNOSIS — G20A2 Parkinson's disease without dyskinesia, with fluctuations: Secondary | ICD-10-CM | POA: Diagnosis not present

## 2023-11-20 DIAGNOSIS — Q2112 Patent foramen ovale: Secondary | ICD-10-CM

## 2023-11-20 DIAGNOSIS — G20B1 Parkinson's disease with dyskinesia, without mention of fluctuations: Secondary | ICD-10-CM

## 2023-11-20 DIAGNOSIS — I6529 Occlusion and stenosis of unspecified carotid artery: Secondary | ICD-10-CM | POA: Diagnosis not present

## 2023-11-20 NOTE — Addendum Note (Signed)
 Addended by: Baldo Ash D on: 11/20/2023 01:39 PM   Modules accepted: Orders

## 2023-11-20 NOTE — Progress Notes (Signed)
 Cardiology Office Note:    Date:  11/20/2023   ID:  Travis Reese, DOB Jul 19, 1946, MRN [id number]  PCP:  Blane Ohara, MD  Cardiologist:  Gypsy Balsam, MD    Referring MD: Blane Ohara, MD   Chief Complaint  Patient presents with   Follow-up    History of Present Illness:    Travis Reese is a 78 y.o. 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  That was done in August oriented 2019.  Valve use was 27 mm InspirEase valve.  Graft site was 30 mm.  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  After that transesophageal echo has been performed which showed no evidence of blood clotting in the atrial appendages. however he was find to have moderate sized PFO.  He also wear a long-term monitor which showed no evidence of atrial fibrillation.  He is coming today to my office to talk about those issues.  Overall recently he was diagnosed with Parkinson's on his current weak tired and exhausted.  Since I have seen him last time a few things happen for several he fell down couple months ago he actually was walking on the sidewalk step on the hall and ended up falling down multiple fractures including subdural hematoma that being followed by the neurosurgeon so far no intervention needed.  Comes today to my office initially said everything is fine he is doing great asymptomatic can walk climb stairs participate in some exercise but he tells me that he was watching TV yesterday and for 5 minutes he lost completely vision in his right eye completing come back to normal and now he is completely asymptomatic and doing well.  Past Medical History:  Diagnosis Date   Abnormal sputum color 07/22/2022   Acute non-recurrent maxillary sinusitis  06/29/2023   Allergic rhinitis due to allergen 07/22/2022   Anxiety    Aortic valve sclerosis    Arthritis    knees, neck, shoulder   Atherosclerosis of aorta (HCC) 09/29/2021   B12 deficiency 09/29/2021   Benign hypertension 07/10/2016   Bipolar I disorder with depression (HCC) 01/27/2023   BPH (benign prostatic hyperplasia) 04/19/2018   Carotid artery stenosis 2022   Mild   Cerumen impaction 07/22/2022   CHF (congestive heart failure) (HCC)    Chronic diastolic CHF (congestive heart failure) (HCC) 04/19/2018   Closed nondisplaced fracture of distal phalanx of left thumb 09/24/2023   Contusion of face 09/24/2023   Cubital tunnel syndrome    DDD (degenerative disc disease), cervical    Depression    Elevated PSA    denies per patient   Elevated serum glucose 08/14/2023   Esophageal dysphagia 03/20/2023   Fall on same level from tripping 09/24/2023   Healthcare maintenance 07/18/2023   History of cardioembolic cerebrovascular accident (CVA)    HTN (hypertension)    Hyperlipidemia    Hyperplasia of prostate with lower urinary tract symptoms (LUTS)    Hypertensive heart disease with chronic diastolic congestive heart failure (HCC) 04/02/2021   Insomnia due to medical condition 09/24/2023   Mild dilation of ascending aorta (HCC)    Mixed hyperlipidemia 09/23/2016   Overview:   2018: dx/rx MIS     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   Osteoarthritis  of cervical and lumbar spine 09/29/2021   Parkinson disease (HCC)    Parkinson's disease without dyskinesia, with fluctuating manifestations (HCC) 03/20/2023   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/2020   PFO (patent foramen ovale)    Prolapsed internal hemorrhoids, grade 3 04/19/2015   Rectal prolapse    S/P aortic valve replacement with bioprosthetic valve 05/01/2018   S/p left hip fracture 06/23/2021   Scoliosis of lumbosacral spine    Stroke [facility])    x2   Subdural hematoma (HCC) 09/24/2023   Syncope  07/18/2023   Tobacco use 07/22/2022   Tremor    Vitamin D deficiency     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   BENTALL PROCEDURE N/A 05/01/2018   Procedure: BIOLOGICAL BENTALL PROCEDURE AORTIC ROOT REPLACEMENT WITH VALSALVA GRAFT & INSPIRIS VALVE.;  Surgeon: Purcell Nails, MD;  Location: [facility] OR;  Service: Open Heart Surgery;  Laterality: N/A;   BUBBLE STUDY  04/13/2021   Procedure: BUBBLE STUDY;  Surgeon: Jodelle Red, MD;  Location: [facility] ENDOSCOPY;  Service: Cardiovascular;;   CATARACT EXTRACTION Bilateral 04/2018   COLONOSCOPY  2007   NASAL SINUS SURGERY  2006   RIGHT/LEFT HEART CATH AND CORONARY ANGIOGRAPHY N/A 04/27/2018   Procedure: RIGHT/LEFT HEART CATH AND CORONARY ANGIOGRAPHY;  Surgeon: Dolores Patty, MD;  Location: [facility] INVASIVE CV LAB;  Service: Cardiovascular;  Laterality: N/A;   TEE WITHOUT CARDIOVERSION N/A 04/27/2018   Procedure: TRANSESOPHAGEAL ECHOCARDIOGRAM (TEE);  Surgeon: Dolores Patty, MD;  Location: [facility] ENDOSCOPY;  Service: Cardiovascular;  Laterality: N/A;   TEE WITHOUT CARDIOVERSION N/A 05/01/2018   Procedure: TRANSESOPHAGEAL ECHOCARDIOGRAM (TEE);  Surgeon: Purcell Nails, MD;  Location: [facility] OR;  Service: Open Heart Surgery;  Laterality: N/A;   TEE WITHOUT CARDIOVERSION N/A 04/13/2021   Procedure: TRANSESOPHAGEAL ECHOCARDIOGRAM (TEE);  Surgeon: Jodelle Red, MD;  Location: [facility] ENDOSCOPY;  Service: Cardiovascular;  Laterality: N/A;   TRANSANAL HEMORRHOIDAL DEARTERIALIZATION N/A 11/15/2015   Procedure: TRANSANAL HEMORRHOIDAL DEARTERIALIZATION;  Surgeon: Romie Levee, MD;  Location: [facility] [place];  Service: General;  Laterality: N/A;    Current Medications: Current Meds  Medication Sig   acetaminophen (TYLENOL) 325 MG tablet Take 2 tablets (650 mg total) by mouth every 6 (six) hours as needed for mild pain (or Fever >/= 101).   albuterol (VENTOLIN HFA) 108 (90  Base) MCG/ACT inhaler Inhale 1-2 puffs into the lungs every 6 (six) hours as needed for wheezing or shortness of breath.   amantadine (SYMMETREL) 100 MG capsule TAKE 1 CAPSULE BY MOUTH AT  1 PM (Patient taking differently: Take 100 mg by mouth daily. TAKE 1 CAPSULE BY MOUTH AT  1 PM)   aspirin EC 325 MG EC tablet Take 1 tablet (325 mg total) by mouth daily.   atorvastatin (LIPITOR) 20 MG tablet TAKE 1 TABLET BY MOUTH ONCE  DAILY   buPROPion (WELLBUTRIN XL) 150 MG 24 hr tablet Take 1 tablet (150 mg total) by mouth daily.   carbidopa-levodopa (SINEMET CR) 50-200 MG tablet TAKE 1 TABLET BY MOUTH EVERY  NIGHT AT BEDTIME   carbidopa-levodopa (SINEMET IR) 25-100 MG tablet Take 2 tablets by mouth 3 (three) times daily.   carvedilol (COREG) 3.125 MG tablet Take 1 tablet (3.125 mg total) by mouth 2 (two) times daily.   celecoxib (CELEBREX) 200 MG capsule TAKE 1 CAPSULE BY MOUTH DAILY   cholecalciferol (VITAMIN D3) 25 MCG (1000 UNIT) tablet Take 2,000 Units by mouth daily.  Cyanocobalamin 1000 MCG SUBL Place 1,000 mcg under the tongue daily.   fluticasone (FLONASE) 50 MCG/ACT nasal spray Place 1 spray into both nostrils 3 times/day as needed-between meals & bedtime. (Patient taking differently: Place 1 spray into both nostrils 3 times/day as needed-between meals & bedtime for allergies or rhinitis.)   furosemide (LASIX) 20 MG tablet Take 1 tablet (20 mg total) by mouth daily.   HYDROcodone-acetaminophen (NORCO/VICODIN) 5-325 MG tablet Take 1 tablet by mouth every 8 (eight) hours as needed for moderate pain (pain score 4-6) or severe pain (pain score 7-10).   KRILL OIL PO Take 1 tablet by mouth daily.   montelukast (SINGULAIR) 10 MG tablet TAKE 1 TABLET BY MOUTH AT  BEDTIME (Patient taking differently: Take 10 mg by mouth at bedtime.)   polyethylene glycol powder (GLYCOLAX/MIRALAX) 17 GM/SCOOP powder Take 0.5 Containers by mouth as needed for mild constipation or moderate constipation.   sertraline (ZOLOFT)  100 MG tablet Take 1.5 tablets (150 mg total) by mouth daily.   tamsulosin (FLOMAX) 0.4 MG CAPS capsule TAKE 2 CAPSULES BY MOUTH DAILY  AFTER SUPPER (Patient taking differently: Take 0.8 mg by mouth daily after supper.)   Zinc 50 MG TABS Take 50 mg by mouth at bedtime.     Allergies:   Patient has no known allergies.   Social History   Socioeconomic History   Marital status: Single    Spouse name: Not on file   Number of children: 2   Years of education: Not on file   Highest education level: 12th grade  Occupational History   Occupation: retired    Comment: security guard  Tobacco Use   Smoking status: Every Day    Current packs/day: 0.15    Average packs/day: 0.2 packs/day for 50.0 years (7.5 ttl pk-yrs)    Types: Cigarettes   Smokeless tobacco: Never   Tobacco comments:    Patient has cut down to three cigarettes a day  Vaping Use   Vaping status: Former  Substance and Sexual Activity   Alcohol use: Not Currently    Comment: Occasional   Drug use: No   Sexual activity: Not Currently  Other Topics Concern   Not on file  Social History Narrative   1 son, 1 daughter   Agricultural consultant work   3 caffeine/day   Right handed   Social Drivers of Health   Financial Resource Strain: Low Risk  (09/20/2023)   Overall Financial Resource Strain (CARDIA)    Difficulty of Paying Living Expenses: Not hard at all  Food Insecurity: No Food Insecurity (09/20/2023)   Hunger Vital Sign    Worried About Running Out of Food in the Last Year: Never true    Ran Out of Food in the Last Year: Never true  Transportation Needs: No Transportation Needs (09/20/2023)   PRAPARE - Administrator, Civil Service (Medical): No    Lack of Transportation (Non-Medical): No  Physical Activity: Inactive (09/20/2023)   Exercise Vital Sign    Days of Exercise per Week: 0 days    Minutes of Exercise per Session: 0 min  Stress: Stress Concern Present (09/20/2023)   [employer] of Occupational  Health - Occupational Stress Questionnaire    Feeling of Stress : To some extent  Social Connections: Moderately Isolated (09/20/2023)   Social Connection and Isolation Panel [NHANES]    Frequency of Communication with Friends and Family: Three times a week    Frequency of Social Gatherings with Friends and Family:  Once a week    Attends Religious Services: 1 to 4 times per year    Active Member of Clubs or Organizations: No    Attends Banker Meetings: Never    Marital Status: Divorced     Family History: The patient's family history includes Alcohol abuse in his sister; Breast cancer in his sister; COPD in his mother; Colon polyps in his father; Depression in his sister; Heart attack in his mother; Hyperlipidemia in his father; Hypertension in his brother and sister; Parkinson's disease in his father; Prostate cancer in his father and paternal uncle. ROS:   Please see the history of present illness.    All 14 point review of systems negative except as described per history of present illness  EKGs/Labs/Other Studies Reviewed:         Recent Labs: 07/18/2023: TSH 1.160 09/23/2023: ALT 12; BUN 13; Creatinine, Ser 0.78; Hemoglobin 12.5; Platelets 277; Potassium 4.6; Sodium 138  Recent Lipid Panel    Component Value Date/Time   CHOL 106 07/18/2023 1015   TRIG 76 07/18/2023 1015   HDL 54 07/18/2023 1015   CHOLHDL 2.0 07/18/2023 1015   CHOLHDL 2.2 04/29/2018 0500   VLDL 6 04/29/2018 0500   LDLCALC 36 07/18/2023 1015    Physical Exam:    VS:  BP 118/78 (BP Location: Right Arm, Patient Position: Sitting)   Pulse 70   Ht 6\' 2"  (1.88 m)   Wt 159 lb 6.4 oz (72.3 kg)   SpO2 94%   BMI 20.47 kg/m     Wt Readings from Last 3 Encounters:  11/20/23 159 lb 6.4 oz (72.3 kg)  10/06/23 163 lb (73.9 kg)  09/25/23 161 lb 9.6 oz (73.3 kg)     GEN:  Well nourished, well developed in no acute distress HEENT: Normal NECK: No JVD; No carotid bruits LYMPHATICS: No  lymphadenopathy CARDIAC: RRR, no murmurs, no rubs, no gallops RESPIRATORY:  Clear to auscultation without rales, wheezing or rhonchi  ABDOMEN: Soft, non-tender, non-distended MUSCULOSKELETAL:  No edema; No deformity  SKIN: Warm and dry LOWER EXTREMITIES: no swelling NEUROLOGIC:  Alert and oriented x 3 PSYCHIATRIC:  Normal affect   ASSESSMENT:    1. Stenosis of carotid artery, unspecified laterality   2. Primary hypertension   3. PFO (patent foramen ovale)   4. Parkinson's disease without dyskinesia, with fluctuating manifestations (HCC)   5. S/P aortic valve replacement with bioprosthetic valve    PLAN:    In order of problems listed above:  Amaurosis fugax obviously very concerning finding.  Will schedule him to have carotic ultrasounds, we will wait for decision of the neurosurgeon for can restart his aspirin.  I will send message also to his neurologist regarding that effect. Essential hypertension: Blood pressure well-controlled continue present management. PFO concerning now with his symptomatology. Parkinson: Noted.  Continue monitoring.    Medication Adjustments/Labs and Tests Ordered: Current medicines are reviewed at length with the patient today.  Concerns regarding medicines are outlined above.  No orders of the defined types were placed in this encounter.  Medication changes: No orders of the defined types were placed in this encounter.   Signed, Georgeanna Lea, MD, [facility] 11/20/2023 1:22 PM    [facility]

## 2023-11-20 NOTE — Patient Instructions (Signed)
Medication Instructions:  Your physician recommends that you continue on your current medications as directed. Please refer to the Current Medication list given to you today.  *If you need a refill on your cardiac medications before your next appointment, please call your pharmacy*   Lab Work: None Ordered If you have labs (blood work) drawn today and your tests are completely normal, you will receive your results only by: MyChart Message (if you have MyChart) OR A paper copy in the mail If you have any lab test that is abnormal or we need to change your treatment, we will call you to review the results.   Testing/Procedures: Your physician has requested that you have a carotid duplex. This test is an ultrasound of the carotid arteries in your neck. It looks at blood flow through these arteries that supply the brain with blood. Allow one hour for this exam. There are no restrictions or special instructions.    Follow-Up: At [facility], you and your health needs are our priority.  As part of our continuing mission to provide you with exceptional heart care, we have created designated Provider Care Teams.  These Care Teams include your primary Cardiologist (physician) and Advanced Practice Providers (APPs -  Physician Assistants and Nurse Practitioners) who all work together to provide you with the care you need, when you need it.  We recommend signing up for the patient portal called "MyChart".  Sign up information is provided on this After Visit Summary.  MyChart is used to connect with patients for Virtual Visits (Telemedicine).  Patients are able to view lab/test results, encounter notes, upcoming appointments, etc.  Non-urgent messages can be sent to your provider as well.   To learn more about what you can do with MyChart, go to [email or web address].    Your next appointment:   3 month(s)  The format for your next appointment:   In Person  Provider:   Gypsy Balsam, MD     Other Instructions NA

## 2023-11-21 ENCOUNTER — Encounter: Payer: Self-pay | Admitting: Psychiatry

## 2023-11-21 ENCOUNTER — Telehealth (INDEPENDENT_AMBULATORY_CARE_PROVIDER_SITE_OTHER): Payer: Medicare Other | Admitting: Psychiatry

## 2023-11-21 DIAGNOSIS — S02401D Maxillary fracture, unspecified, subsequent encounter for fracture with routine healing: Secondary | ICD-10-CM | POA: Diagnosis not present

## 2023-11-21 DIAGNOSIS — G2581 Restless legs syndrome: Secondary | ICD-10-CM | POA: Diagnosis not present

## 2023-11-21 DIAGNOSIS — Z8673 Personal history of transient ischemic attack (TIA), and cerebral infarction without residual deficits: Secondary | ICD-10-CM | POA: Diagnosis not present

## 2023-11-21 DIAGNOSIS — S065X0D Traumatic subdural hemorrhage without loss of consciousness, subsequent encounter: Secondary | ICD-10-CM | POA: Diagnosis not present

## 2023-11-21 DIAGNOSIS — R498 Other voice and resonance disorders: Secondary | ICD-10-CM | POA: Diagnosis not present

## 2023-11-21 DIAGNOSIS — G4700 Insomnia, unspecified: Secondary | ICD-10-CM | POA: Diagnosis not present

## 2023-11-21 DIAGNOSIS — F419 Anxiety disorder, unspecified: Secondary | ICD-10-CM

## 2023-11-21 DIAGNOSIS — S62502D Fracture of unspecified phalanx of left thumb, subsequent encounter for fracture with routine healing: Secondary | ICD-10-CM | POA: Diagnosis not present

## 2023-11-21 DIAGNOSIS — S2242XD Multiple fractures of ribs, left side, subsequent encounter for fracture with routine healing: Secondary | ICD-10-CM | POA: Diagnosis not present

## 2023-11-21 DIAGNOSIS — F3341 Major depressive disorder, recurrent, in partial remission: Secondary | ICD-10-CM | POA: Diagnosis not present

## 2023-11-21 DIAGNOSIS — G20A1 Parkinson's disease without dyskinesia, without mention of fluctuations: Secondary | ICD-10-CM | POA: Diagnosis not present

## 2023-11-21 DIAGNOSIS — I10 Essential (primary) hypertension: Secondary | ICD-10-CM | POA: Diagnosis not present

## 2023-11-21 DIAGNOSIS — Z9181 History of falling: Secondary | ICD-10-CM | POA: Diagnosis not present

## 2023-11-21 DIAGNOSIS — S22089D Unspecified fracture of T11-T12 vertebra, subsequent encounter for fracture with routine healing: Secondary | ICD-10-CM | POA: Diagnosis not present

## 2023-11-21 DIAGNOSIS — Z791 Long term (current) use of non-steroidal anti-inflammatories (NSAID): Secondary | ICD-10-CM | POA: Diagnosis not present

## 2023-11-21 DIAGNOSIS — E785 Hyperlipidemia, unspecified: Secondary | ICD-10-CM | POA: Diagnosis not present

## 2023-11-21 DIAGNOSIS — D62 Acute posthemorrhagic anemia: Secondary | ICD-10-CM | POA: Diagnosis not present

## 2023-11-21 MED ORDER — SERTRALINE HCL 100 MG PO TABS: 150.0000 mg | ORAL_TABLET | Freq: Every day | ORAL | 0 refills | Status: DC

## 2023-11-21 NOTE — Patient Instructions (Signed)
 Continue sertraline 150 mg at night  Continue bupropion 150 mg daily Next appointment: 4/30 at 4 pm

## 2023-11-25 DIAGNOSIS — S065XAA Traumatic subdural hemorrhage with loss of consciousness status unknown, initial encounter: Secondary | ICD-10-CM | POA: Diagnosis not present

## 2023-11-27 ENCOUNTER — Ambulatory Visit: Payer: Medicare Other | Admitting: Psychology

## 2023-11-27 DIAGNOSIS — F4323 Adjustment disorder with mixed anxiety and depressed mood: Secondary | ICD-10-CM | POA: Diagnosis not present
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
## 2023-12-11 ENCOUNTER — Ambulatory Visit: Attending: Cardiology

## 2023-12-11 DIAGNOSIS — I6523 Occlusion and stenosis of bilateral carotid arteries: Secondary | ICD-10-CM | POA: Diagnosis not present

## 2023-12-11 DIAGNOSIS — I6529 Occlusion and stenosis of unspecified carotid artery: Secondary | ICD-10-CM

## 2023-12-17 ENCOUNTER — Telehealth: Payer: Self-pay

## 2023-12-17 ENCOUNTER — Other Ambulatory Visit: Payer: Self-pay | Admitting: Family Medicine

## 2023-12-17 NOTE — Telephone Encounter (Signed)
 [place] Results reviewed with pt as per Dr. Vanetta Shawl note.  Pt verbalized understanding and had no additional questions. Routed to PCP

## 2023-12-21 ENCOUNTER — Other Ambulatory Visit: Payer: Self-pay | Admitting: Family Medicine

## 2023-12-22 ENCOUNTER — Other Ambulatory Visit: Payer: Self-pay | Admitting: Family Medicine

## 2023-12-22 ENCOUNTER — Other Ambulatory Visit: Payer: Self-pay

## 2023-12-22 DIAGNOSIS — N401 Enlarged prostate with lower urinary tract symptoms: Secondary | ICD-10-CM

## 2023-12-22 MED ORDER — TAMSULOSIN HCL 0.4 MG PO CAPS: 0.8000 mg | ORAL_CAPSULE | Freq: Every day | ORAL | 0 refills | Status: DC

## 2023-12-23 ENCOUNTER — Ambulatory Visit: Payer: Medicare Other | Admitting: Family Medicine

## 2023-12-25 ENCOUNTER — Ambulatory Visit (INDEPENDENT_AMBULATORY_CARE_PROVIDER_SITE_OTHER): Admitting: Psychology

## 2023-12-25 DIAGNOSIS — F4323 Adjustment disorder with mixed anxiety and depressed mood: Secondary | ICD-10-CM

## 2023-12-25 NOTE — Progress Notes (Signed)
 [facility] Counselor/Therapist Progress Note  Patient ID: Travis Reese, MRN: [id number],    Date: 12/25/2023  Time Spent: 4:00pm-4:50pm   50 minutes   Treatment Type: Individual Therapy  Reported Symptoms: stress  Mental Status Exam: Appearance:  Casual     Behavior: Appropriate  Motor: Normal  Speech/Language:  Normal Rate  Affect: Appropriate  Mood: normal  Thought process: normal  Thought content:   WNL  Sensory/Perceptual disturbances:   WNL  Orientation: oriented to person, place, time/date, and situation  Attention: Good  Concentration: Good  Memory: WNL  Fund of knowledge:  Good  Insight:   Good  Judgment:  Good  Impulse Control: Good   Risk Assessment: Danger to Self:  No Self-injurious Behavior: No Danger to Others: No Duty to Warn:no Physical Aggression / Violence:No  Access to Firearms a concern: No  Gang Involvement:No   Subjective: Pt present for face-to-face individual therapy via video.  Pt consents to telehealth video session and is aware of limitations and benefits of virtual sessions. Location of pt: home Location of therapist: home office.   Pt talked about his health.  He is having balance and dizzyness issues and is not sure if it is from his parkinson's or spine.  Pt will get doctors appointments to address the issues.   Addressed pt's feelings about his health concerns.  Pt talked about his sleep.   He completed 2 weeks of sleep diaries.   Pt shared his data which therapist will analyze before next session. Pt states he feels tired most mornings after his night of sleep.   His partner has shared with him that he has been talking in his sleep a few times a night in a language she can't understand.   He also flails his arms and hands.  He does not remember these incidents. Provided supportive therapy.   Plan to meet in two weeks.    Interventions: Cognitive Behavioral Therapy and Insight-Oriented  Diagnosis:  F43.23  Plan of  Care: Recommend ongoing therapy.  Pt participated in setting treatment goals.   He would like to improve his sleep and feel less anxious and depressed.   Plan to meet every two weeks.   Pt agrees with treatment plan.    Treatment Plan Client Abilities/Strengths  Pt is engaging and motivated for therapy.  Client Treatment Preferences  Individual therapy.  Client Statement of Needs  Improve copings skills and improve sleep. Symptoms  Depressed or irritable mood. Excessive and/or unrealistic worry that is difficult to control occurring more days than not for at least 6 months about a number of events or activities. Hypervigilance (e.g., feeling constantly on edge, experiencing concentration difficulties, having trouble falling or staying asleep, exhibiting a general state of irritability). Low self-esteem. Problems Addressed  Unipolar Depression, Anxiety Goals 1. Alleviate depressive symptoms and return to previous level of effective functioning. 2. Appropriately grieve the loss in order to normalize mood and to return to previously adaptive level of functioning. Objective Learn and implement behavioral strategies to overcome depression. Target Date: 2024-11-26 Frequency: Biweekly  Progress: 10 Modality: individual  Related Interventions Assist the client in developing skills that increase the likelihood of deriving pleasure from behavioral activation (e.g., assertiveness skills, developing an exercise plan, less internal/more external focus, increased social involvement); reinforce success. Engage the client in "behavioral activation," increasing his/her activity level and contact with sources of reward, while identifying processes that inhibit activation. use behavioral techniques such as instruction, rehearsal, role-playing, role reversal, as needed,  to facilitate activity in the client's daily life; reinforce success. 3. Develop healthy interpersonal relationships that lead to the alleviation  and help prevent the relapse of depression. 4. Develop healthy thinking patterns and beliefs about self, others, and the world that lead to the alleviation and help prevent the relapse of depression. 5. Enhance ability to effectively cope with the full variety of life's worries and anxieties. 6. Learn and implement coping skills that result in a reduction of anxiety and worry, and improved daily functioning. Objective Learn and implement problem-solving strategies for realistically addressing worries. Target Date: 2024-11-26 Frequency: Biweekly  Progress: 10 Modality: individual  Related Interventions Assign the client a homework exercise in which he/she problem-solves a current problem (see Mastery of Your Anxiety and Worry: Workbook by Colbert Dates and Edna Gouty or Generalized Anxiety Disorder by Woodson He, and Edna Gouty); review, reinforce success, and provide corrective feedback toward improvement. Teach the client problem-solving strategies involving specifically defining a problem, generating options for addressing it, evaluating the pros and cons of each option, selecting and implementing an optional action, and reevaluating and refining the action. Objective Learn and implement calming skills to reduce overall anxiety and manage anxiety symptoms. Target Date: 2024-11-26 Frequency: Biweekly  Progress: 10 Modality: individual  Related Interventions Assign the client to read about progressive muscle relaxation and other calming strategies in relevant books or treatment manuals (e.g., Progressive Relaxation Training by Juleen Oakland; Mastery of Your Anxiety and Worry: Workbook by Rodney Clamp). Assign the client homework each session in which he/she practices relaxation exercises daily, gradually applying them progressively from non-anxiety-provoking to anxiety-provoking situations; review and reinforce success while providing corrective feedback toward improvement. Teach the client  calming/relaxation skills (e.g., applied relaxation, progressive muscle relaxation, cue controlled relaxation; mindful breathing; biofeedback) and how to discriminate better between relaxation and tension; teach the client how to apply these skills to his/her daily life. 7. Recognize, accept, and cope with feelings of depression. 8. Reduce overall frequency, intensity, and duration of the anxiety so that daily functioning is not impaired. 9. Resolve the core conflict that is the source of anxiety. 10. Stabilize anxiety level while increasing ability to function on a daily basis. 

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
## 2023-12-28 ENCOUNTER — Other Ambulatory Visit: Payer: Self-pay | Admitting: Family Medicine

## 2023-12-30 ENCOUNTER — Ambulatory Visit (INDEPENDENT_AMBULATORY_CARE_PROVIDER_SITE_OTHER)

## 2023-12-30 VITALS — BP 110/80 | HR 79 | Temp 98.5°F | Resp 18 | Ht 74.0 in | Wt 162.0 lb

## 2023-12-30 DIAGNOSIS — S065X0S Traumatic subdural hemorrhage without loss of consciousness, sequela: Secondary | ICD-10-CM

## 2023-12-30 DIAGNOSIS — G20A2 Parkinson's disease without dyskinesia, with fluctuations: Secondary | ICD-10-CM

## 2023-12-30 DIAGNOSIS — Z952 Presence of prosthetic heart valve: Secondary | ICD-10-CM

## 2023-12-30 DIAGNOSIS — R42 Dizziness and giddiness: Secondary | ICD-10-CM | POA: Diagnosis not present

## 2023-12-30 DIAGNOSIS — F331 Major depressive disorder, recurrent, moderate: Secondary | ICD-10-CM | POA: Diagnosis not present

## 2023-12-30 DIAGNOSIS — I5032 Chronic diastolic (congestive) heart failure: Secondary | ICD-10-CM | POA: Diagnosis not present

## 2023-12-30 DIAGNOSIS — S065X0D Traumatic subdural hemorrhage without loss of consciousness, subsequent encounter: Secondary | ICD-10-CM | POA: Diagnosis not present

## 2023-12-30 MED ORDER — TRIAMCINOLONE ACETONIDE 55 MCG/ACT NA AERO: 2.0000 | INHALATION_SPRAY | Freq: Every day | NASAL | 12 refills | Status: AC

## 2023-12-30 MED ORDER — FLUOXETINE HCL 20 MG PO TABS: 20.0000 mg | ORAL_TABLET | Freq: Every day | ORAL | 1 refills | Status: DC

## 2023-12-30 NOTE — Assessment & Plan Note (Addendum)
 Bioprosthetic Aortic valve replacement status Status post aortic valve replacement, no current issues reported.

## 2023-12-30 NOTE — Assessment & Plan Note (Addendum)
 Intermittent dizziness and lightheadedness, particularly with movement. No recent falls or syncope since January. Blood pressure remains stable during episodes. Carotid ultrasound showed 39% bilateral blockage, not significant enough to cause symptoms. Echocardiogram and CT scan of the head were normal. Differential includes Parkinson's disease, medication side effects, or other neurological causes. - Advise adequate hydration and gradual position changes to manage dizziness. - to report back if severe symptoms.   Vision disturbances Intermittent diplopia and blurred vision. Occasional transient monocular vision loss in the right eye lasting about five minutes, last episode a month ago. Carotid ultrasound and CT scan of the head were normal. Differential includes neurological causes or medication side effects.  Chronic pain in legs Chronic pain in the right leg, extending from the ankle to the hip, and sometimes in the left leg. Pain is severe in the morning and may be related to Parkinson's disease, spinal stenosis, or arthritis. - Consider pain management options if symptoms persist. has not taken the hydrocodone  listed on his med list in a long time.    Allergic rhinitis Allergic rhinitis managed with azelastine and fluticasone  nasal sprays. He prefers over-the-counter options due to cost concerns. - Switch to over-the-counter azelastine and fluticasone  nasal sprays. - Consider Nasacort  if Flonase  is not effective.

## 2023-12-30 NOTE — Assessment & Plan Note (Signed)
 Subdural hematoma, resolving Subdural hematoma from January incident is resolving. Last CT scan showed a small residual area, with no significant issues noted.

## 2023-12-30 NOTE — Assessment & Plan Note (Signed)
 Current treatment with Zoloft  and bupropion  is not effective. Symptoms of depression persist. No specific triggers identified, but stress from Parkinson's disease and past fall contribute to overall mental health. Plan to switch to fluoxetine (Prozac) due to its different mechanism and potential for better efficacy. - Discontinue Zoloft  and start fluoxetine 20 mg daily, with a plan to increase to 40 mg if needed after one month. - Continue bupropion  XL 150 mg daily.
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
## 2023-12-30 NOTE — Assessment & Plan Note (Signed)
 Continue coreg  25 mg twice daily and spironolactone  25 mg daily.  Last echo in Feb 2025 normal. Well compensated at this time Management per specialist.

## 2023-12-30 NOTE — Assessment & Plan Note (Signed)
 Parkinson's disease managed with amantadine  and Sinemet . Symptoms include chronic leg pain and short-term memory issues. Family history of Parkinson's disease. - Continue current Parkinson's medications.

## 2023-12-30 NOTE — Patient Instructions (Signed)
  VISIT SUMMARY: During today's visit, we discussed your ongoing issues with dizziness, vision disturbances, leg pain, Parkinson's disease, depression, urinary symptoms, and allergic rhinitis. We reviewed your recent tests and current medications, and we made some adjustments to better manage your symptoms.  YOUR PLAN: -DIZZINESS AND LIGHTHEADEDNESS: Your dizziness and lightheadedness, especially when moving, may be related to Parkinson's disease or medication side effects. Your blood pressure is stable, and recent tests did not show significant issues. To help manage these symptoms, make sure to stay hydrated and change positions slowly.  -VISION DISTURBANCES: You have been experiencing double vision, blurred vision, and occasional vision loss in your right eye. These symptoms could be related to neurological causes or medication side effects. We will continue to monitor these symptoms.  -CHRONIC PAIN IN LEGS: You have chronic pain in your legs, which is severe in the morning and may be related to Parkinson's disease, spinal stenosis, or arthritis. We will consider pain management options if your symptoms persist.  -PARKINSON'S DISEASE: Your Parkinson's disease is being managed with amantadine  and Sinemet . You are experiencing chronic leg pain and some memory issues. Continue taking your current medications as prescribed.  -DEPRESSION: Your current treatment for depression with Zoloft  and bupropion  is not effective. We will switch you to fluoxetine (Prozac) 20 mg daily, with a plan to increase to 40 mg if needed after one month. Continue taking bupropion .  -URINARY SYMPTOMS: You have urinary hesitancy and a weak stream, likely due to prostate issues. Continue taking Flomax  as prescribed.  -AORTIC VALVE REPLACEMENT STATUS: You have had an aortic valve replacement in the past, and there are no current issues reported.  -SUBDURAL HEMATOMA, RESOLVING: Your subdural hematoma from January is resolving,  and the last CT scan showed a small residual area with no significant issues.  -ALLERGIC RHINITIS: Your allergic rhinitis is being managed with nasal sprays. We will switch you to over-the-counter azelastine and fluticasone  nasal sprays. If Flonase  is not effective, consider using Nasacort .  INSTRUCTIONS: Discontinue Zoloft  and start taking fluoxetine (Prozac) 20 mg daily. We will plan to increase the dose to 40 mg if needed after one month. Continue taking bupropion . Make sure to stay hydrated and change positions slowly to manage dizziness. Continue taking your current medications for Parkinson's disease and urinary symptoms as prescribed. Use over-the-counter azelastine and fluticasone  nasal sprays for allergic rhinitis, and consider Nasacort  if Flonase  is not effective.        Contains text generated by [employer].

## 2023-12-31 ENCOUNTER — Telehealth: Admitting: Psychiatry

## 2023-12-31 ENCOUNTER — Encounter: Payer: Self-pay | Admitting: Psychiatry

## 2023-12-31 DIAGNOSIS — G4700 Insomnia, unspecified: Secondary | ICD-10-CM | POA: Diagnosis not present

## 2023-12-31 DIAGNOSIS — G4710 Hypersomnia, unspecified: Secondary | ICD-10-CM

## 2023-12-31 DIAGNOSIS — F419 Anxiety disorder, unspecified: Secondary | ICD-10-CM | POA: Diagnosis not present

## 2023-12-31 DIAGNOSIS — F330 Major depressive disorder, recurrent, mild: Secondary | ICD-10-CM | POA: Diagnosis not present

## 2023-12-31 DIAGNOSIS — H539 Unspecified visual disturbance: Secondary | ICD-10-CM

## 2023-12-31 NOTE — Patient Instructions (Signed)
 Decreased sertraline  100 mg at night for one week, then 50 mg at night for one week, then discontinue Start fluoxetine 20 mg daily  Continue bupropion  150 mg daily  Next appointment: 6/13 at 10 30

## 2024-01-02 ENCOUNTER — Telehealth: Payer: Self-pay | Admitting: Neurology

## 2024-01-02 NOTE — Telephone Encounter (Signed)
 I was asked to get patient in to see Dr Tat and I called and offered the patient a appt on 01-13-24 and he said the time was to early so we got him sch for 01-22-24 in the afternoon

## 2024-01-11 ENCOUNTER — Other Ambulatory Visit: Payer: Self-pay | Admitting: Family Medicine

## 2024-01-12 ENCOUNTER — Ambulatory Visit (INDEPENDENT_AMBULATORY_CARE_PROVIDER_SITE_OTHER): Admitting: Psychology

## 2024-01-12 DIAGNOSIS — F4323 Adjustment disorder with mixed anxiety and depressed mood: Secondary | ICD-10-CM

## 2024-01-12 NOTE — Progress Notes (Signed)
 [facility] Counselor/Therapist Progress Note  Patient ID: DYLIN HALLAK, MRN: [id number],    Date: 01/12/2024  Time Spent: 4:00pm-4:45pm   45 minutes   Treatment Type: Individual Therapy  Reported Symptoms: stress  Mental Status Exam: Appearance:  Casual     Behavior: Appropriate  Motor: Normal  Speech/Language:  Normal Rate  Affect: Appropriate  Mood: normal  Thought process: normal  Thought content:   WNL  Sensory/Perceptual disturbances:   WNL  Orientation: oriented to person, place, time/date, and situation  Attention: Good  Concentration: Good  Memory: WNL  Fund of knowledge:  Good  Insight:   Good  Judgment:  Good  Impulse Control: Good   Risk Assessment: Danger to Self:  No Self-injurious Behavior: No Danger to Others: No Duty to Warn:no Physical Aggression / Violence:No  Access to Firearms a concern: No  Gang Involvement:No   Subjective: Pt present for face-to-face individual therapy via video.  Pt consents to telehealth video session and is aware of limitations and benefits of virtual sessions. Location of pt: home Location of therapist: home office.   Pt talked about his health.  He is still having balance, eyesight, and dizzyness issues.  He sees his neurologist next week and sees his back doctor the week after.  Pt has chronic pain and it is most severe in the mornings.   Addressed pt's feelings about his health concerns.  Pt talked about his sleep.   Analyzed pt's sleep diaries and determined that his average sleep time each night is 9 hours.   Half of the nights it took him longer to get to sleep than he'd like.  It took him 30-45 minutes.  During that time his thoughts were racing and her felt anxious.   Encouraged pt to journal a "thought dump" during those times. Pt states he feels tired most mornings after his night of sleep.   Addressed that pt's tiredness could be from other factors than lack of sleep since he is getting 9 hours of  sleep a night.  Encouraged pt to talk to his doctor to assess any health issues or medication side effects that could be contributing to his tiredness. Pt talked about family issues.  He has had conflict with his brothers and sisters and have not talked to them in 2 months.  This upsets him.  Addressed the family dynamics.  Pt often feels put in the middle and states his siblings can be stubborn.  Pt talked about how he spends his time during the day.   He spends time on the computer on facebook or watching movies.  He also does some home chores.  Pt and partner Siri Duet go shopping about every other day at [facility] but pt has to be very careful bc of his balance issues.   Provided supportive therapy.   Plan to meet in two weeks.    Interventions: Cognitive Behavioral Therapy and Insight-Oriented  Diagnosis:  F43.23  Plan of Care: Recommend ongoing therapy.  Pt participated in setting treatment goals.   He would like to improve his sleep and feel less anxious and depressed.   Plan to meet every two weeks.   Pt agrees with treatment plan.    Treatment Plan Client Abilities/Strengths  Pt is engaging and motivated for therapy.  Client Treatment Preferences  Individual therapy.  Client Statement of Needs  Improve copings skills and improve sleep. Symptoms  Depressed or irritable mood. Excessive and/or unrealistic worry that is difficult to control occurring more  days than not for at least 6 months about a number of events or activities. Hypervigilance (e.g., feeling constantly on edge, experiencing concentration difficulties, having trouble falling or staying asleep, exhibiting a general state of irritability). Low self-esteem. Problems Addressed  Unipolar Depression, Anxiety Goals 1. Alleviate depressive symptoms and return to previous level of effective functioning. 2. Appropriately grieve the loss in order to normalize mood and to return to previously adaptive level of  functioning. Objective Learn and implement behavioral strategies to overcome depression. Target Date: 2024-11-26 Frequency: Biweekly  Progress: 10 Modality: individual  Related Interventions Assist the client in developing skills that increase the likelihood of deriving pleasure from behavioral activation (e.g., assertiveness skills, developing an exercise plan, less internal/more external focus, increased social involvement); reinforce success. Engage the client in "behavioral activation," increasing his/her activity level and contact with sources of reward, while identifying processes that inhibit activation. use behavioral techniques such as instruction, rehearsal, role-playing, role reversal, as needed, to facilitate activity in the client's daily life; reinforce success. 3. Develop healthy interpersonal relationships that lead to the alleviation and help prevent the relapse of depression. 4. Develop healthy thinking patterns and beliefs about self, others, and the world that lead to the alleviation and help prevent the relapse of depression. 5. Enhance ability to effectively cope with the full variety of life's worries and anxieties. 6. Learn and implement coping skills that result in a reduction of anxiety and worry, and improved daily functioning. Objective Learn and implement problem-solving strategies for realistically addressing worries. Target Date: 2024-11-26 Frequency: Biweekly  Progress: 10 Modality: individual  Related Interventions Assign the client a homework exercise in which he/she problem-solves a current problem (see Mastery of Your Anxiety and Worry: Workbook by Colbert Dates and Edna Gouty or Generalized Anxiety Disorder by Woodson He, and Edna Gouty); review, reinforce success, and provide corrective feedback toward improvement. Teach the client problem-solving strategies involving specifically defining a problem, generating options for addressing it, evaluating the pros and cons of  each option, selecting and implementing an optional action, and reevaluating and refining the action. Objective Learn and implement calming skills to reduce overall anxiety and manage anxiety symptoms. Target Date: 2024-11-26 Frequency: Biweekly  Progress: 10 Modality: individual  Related Interventions Assign the client to read about progressive muscle relaxation and other calming strategies in relevant books or treatment manuals (e.g., Progressive Relaxation Training by Juleen Oakland; Mastery of Your Anxiety and Worry: Workbook by Rodney Clamp). Assign the client homework each session in which he/she practices relaxation exercises daily, gradually applying them progressively from non-anxiety-provoking to anxiety-provoking situations; review and reinforce success while providing corrective feedback toward improvement. Teach the client calming/relaxation skills (e.g., applied relaxation, progressive muscle relaxation, cue controlled relaxation; mindful breathing; biofeedback) and how to discriminate better between relaxation and tension; teach the client how to apply these skills to his/her daily life. 7. Recognize, accept, and cope with feelings of depression. 8. Reduce overall frequency, intensity, and duration of the anxiety so that daily functioning is not impaired. 9. Resolve the core conflict that is the source of anxiety. 10. Stabilize anxiety level while increasing ability to function on a daily basis. Diagnosis F43.23 Conditions For Discharge Achievement of treatment goals and objectives   Willey Harrier, LCSW

## 2024-01-13 ENCOUNTER — Ambulatory Visit: Admitting: Neurology

## 2024-01-20 ENCOUNTER — Telehealth: Payer: Self-pay

## 2024-01-20 NOTE — Telephone Encounter (Signed)
 I have not made any phone calls since the last visit. At that visit, I recommended increasing sertraline  to 200 mg at night and NOT starting fluoxetine .

## 2024-01-20 NOTE — Telephone Encounter (Signed)
 Pt.notified

## 2024-01-20 NOTE — Telephone Encounter (Signed)
 pt called states he was returning dr. Edda Goo call states that it was about zoloft  and the fluoxetine . he does not understand what you want him to do.  pt was last seen on 4-30 next appt 6-13
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
## 2024-01-22 ENCOUNTER — Encounter: Payer: Self-pay | Admitting: Neurology

## 2024-01-22 ENCOUNTER — Ambulatory Visit: Admitting: Neurology

## 2024-01-22 ENCOUNTER — Other Ambulatory Visit

## 2024-01-22 VITALS — BP 132/90 | HR 82 | Ht 72.0 in | Wt 165.2 lb

## 2024-01-22 DIAGNOSIS — H532 Diplopia: Secondary | ICD-10-CM | POA: Diagnosis not present

## 2024-01-22 DIAGNOSIS — G7000 Myasthenia gravis without (acute) exacerbation: Secondary | ICD-10-CM | POA: Diagnosis not present

## 2024-01-22 DIAGNOSIS — R6889 Other general symptoms and signs: Secondary | ICD-10-CM | POA: Diagnosis not present

## 2024-01-22 DIAGNOSIS — F331 Major depressive disorder, recurrent, moderate: Secondary | ICD-10-CM

## 2024-01-22 DIAGNOSIS — G453 Amaurosis fugax: Secondary | ICD-10-CM

## 2024-01-22 DIAGNOSIS — G20B1 Parkinson's disease with dyskinesia, without mention of fluctuations: Secondary | ICD-10-CM

## 2024-01-22 NOTE — Patient Instructions (Signed)
 Start aspirin , 81 mg daily  Your provider has requested that you have labwork completed today. The lab is located on the Second floor at [address], within the [facility] office. When you get off the elevator, turn right and go in the [facility] [address]; the first brown door on the left.  Tell the ladies behind the desk that you are there for lab work. If you are not called within 15 minutes please check with the front desk.   Once you complete your labs you are free to go. You will receive a call or message via MyChart with your lab results.

## 2024-01-27 DIAGNOSIS — M25552 Pain in left hip: Secondary | ICD-10-CM | POA: Diagnosis not present

## 2024-01-27 DIAGNOSIS — M48062 Spinal stenosis, lumbar region with neurogenic claudication: Secondary | ICD-10-CM | POA: Diagnosis not present

## 2024-01-27 DIAGNOSIS — M5416 Radiculopathy, lumbar region: Secondary | ICD-10-CM | POA: Diagnosis not present

## 2024-01-28 ENCOUNTER — Telehealth: Payer: Self-pay | Admitting: Cardiology

## 2024-01-28 LAB — MYASTHENIA GRAVIS PANEL 2
A CHR BINDING ABS: <0.3 nmol/L
ACHR Blocking Abs: <15 %{inhibition} (ref <15)
Acetylchol Modul Ab: 1 %{inhibition}

## 2024-01-28 NOTE — Telephone Encounter (Signed)
 Pt c/o medication issue:  1. Name of Medication:   carvedilol  (COREG ) 3.125 MG tablet (Expired)   2. How are you currently taking this medication (dosage and times per day)?   Not taking yet  3. Are you having a reaction (difficulty breathing--STAT)?   4. What is your medication issue?   Patient stated his medication was recently reduced from the 25 mg dosage medication, twice daily.  Patient wants a call back to discuss the dosage change.

## 2024-01-28 NOTE — Telephone Encounter (Signed)
 Copied from CRM [id number]. Topic: Clinical - Medication Question >> Jan 27, 2024  4:14 PM Dewanda Foots wrote: Reason for CRM: Pt states that he has been taking the 25MG   and now is carvedilol  (COREG ) 3.125 MG tablet. Wants to know if this is the correct dose and why/when it changed. Here is his preferred pharmacy. States the pill does not look the same either. Before was a big white and now is a small off-color pill he can hardly pick up.  [facility] [place], [place] - [address] [facility] [address] [place] [address] [place] [address] Phone: [phone] Fax: [phone] Hours: Not open 24 hours  Please call patient back at [phone] to discuss.

## 2024-01-28 NOTE — Telephone Encounter (Signed)
 Advised pt that the dose was decreased to 3.125 mg BID at his January appointment due to dizziness and falls. Pt verbalized understanding and had no additional questions.

## 2024-01-29 ENCOUNTER — Ambulatory Visit: Admitting: Psychology

## 2024-01-29 ENCOUNTER — Ambulatory Visit: Payer: Self-pay | Admitting: Neurology

## 2024-01-29 DIAGNOSIS — F4323 Adjustment disorder with mixed anxiety and depressed mood: Secondary | ICD-10-CM | POA: Diagnosis not present

## 2024-01-29 NOTE — Progress Notes (Signed)
 [facility] Counselor/Therapist Progress Note  Patient ID: Travis Reese, MRN: [id number],    Date: 01/29/2024  Time Spent: 3:00pm-3:50pm   50 minutes   Treatment Type: Individual Therapy  Reported Symptoms: stress  Mental Status Exam: Appearance:  Casual     Behavior: Appropriate  Motor: Normal  Speech/Language:  Normal Rate  Affect: Appropriate  Mood: normal  Thought process: normal  Thought content:   WNL  Sensory/Perceptual disturbances:   WNL  Orientation: oriented to person, place, time/date, and situation  Attention: Good  Concentration: Good  Memory: WNL  Fund of knowledge:  Good  Insight:   Good  Judgment:  Good  Impulse Control: Good   Risk Assessment: Danger to Self:  No Self-injurious Behavior: No Danger to Others: No Duty to Warn:no Physical Aggression / Violence:No  Access to Firearms a concern: No  Gang Involvement:No   Subjective: Pt present for face-to-face individual therapy via video.  Pt consents to telehealth video session and is aware of limitations and benefits of virtual sessions. Location of pt: home Location of therapist: home office.   Pt talked about his health.  He is still having balance, eyesight, and dizzyness issues.  He saw  his neurologist and his back doctor.  They both think pt's symptoms are caused by spine issues and not the parkinsons.   Pt will get an MRI and then get spine treatment, probably injections.   Pt states he gets down at times bc of his health and pain issues.   Addressed pt's feelings about his health concerns.  Pt's psychiatrist has increased his Zoloft  to 200mg .  Pt talked about his sleep.   Pt states he is sleeping better.  It is only taking him about 10-15 minutes to fall asleep at night and he is feeling more rested when he wakes up in the morning.   Pt talked about family issues.  He has had conflict with his brothers and sisters and have not talked to them in 2 months.  This upsets him.  Pt is  planning to reach out to his siblings and see if they can resolve things.  Addressed the family dynamics.  Pt talked about procrastinating on things he needs to do.  Addressed how pt can break down tasks to small achievable steps.  Provided supportive therapy.      Interventions: Cognitive Behavioral Therapy and Insight-Oriented  Diagnosis:  F43.23  Plan of Care: Recommend ongoing therapy.  Pt participated in setting treatment goals.   He would like to improve his sleep and feel less anxious and depressed.   Plan to meet every two weeks.   Pt agrees with treatment plan.    Treatment Plan Client Abilities/Strengths  Pt is engaging and motivated for therapy.  Client Treatment Preferences  Individual therapy.  Client Statement of Needs  Improve copings skills and improve sleep. Symptoms  Depressed or irritable mood. Excessive and/or unrealistic worry that is difficult to control occurring more days than not for at least 6 months about a number of events or activities. Hypervigilance (e.g., feeling constantly on edge, experiencing concentration difficulties, having trouble falling or staying asleep, exhibiting a general state of irritability). Low self-esteem. Problems Addressed  Unipolar Depression, Anxiety Goals 1. Alleviate depressive symptoms and return to previous level of effective functioning. 2. Appropriately grieve the loss in order to normalize mood and to return to previously adaptive level of functioning. Objective Learn and implement behavioral strategies to overcome depression. Target Date: 2024-11-26 Frequency: Biweekly  Progress:  10 Modality: individual  Related Interventions Assist the client in developing skills that increase the likelihood of deriving pleasure from behavioral activation (e.g., assertiveness skills, developing an exercise plan, less internal/more external focus, increased social involvement); reinforce success. Engage the client in "behavioral  activation," increasing his/her activity level and contact with sources of reward, while identifying processes that inhibit activation. use behavioral techniques such as instruction, rehearsal, role-playing, role reversal, as needed, to facilitate activity in the client's daily life; reinforce success. 3. Develop healthy interpersonal relationships that lead to the alleviation and help prevent the relapse of depression. 4. Develop healthy thinking patterns and beliefs about self, others, and the world that lead to the alleviation and help prevent the relapse of depression. 5. Enhance ability to effectively cope with the full variety of life's worries and anxieties. 6. Learn and implement coping skills that result in a reduction of anxiety and worry, and improved daily functioning. Objective Learn and implement problem-solving strategies for realistically addressing worries. Target Date: 2024-11-26 Frequency: Biweekly  Progress: 10 Modality: individual  Related Interventions Assign the client a homework exercise in which he/she problem-solves a current problem (see Mastery of Your Anxiety and Worry: Workbook by Colbert Dates and Edna Gouty or Generalized Anxiety Disorder by Woodson He, and Edna Gouty); review, reinforce success, and provide corrective feedback toward improvement. Teach the client problem-solving strategies involving specifically defining a problem, generating options for addressing it, evaluating the pros and cons of each option, selecting and implementing an optional action, and reevaluating and refining the action. Objective Learn and implement calming skills to reduce overall anxiety and manage anxiety symptoms. Target Date: 2024-11-26 Frequency: Biweekly  Progress: 10 Modality: individual  Related Interventions Assign the client to read about progressive muscle relaxation and other calming strategies in relevant books or treatment manuals (e.g., Progressive Relaxation Training by Juleen Oakland; Mastery of Your Anxiety and Worry: Workbook by Rodney Clamp). Assign the client homework each session in which he/she practices relaxation exercises daily, gradually applying them progressively from non-anxiety-provoking to anxiety-provoking situations; review and reinforce success while providing corrective feedback toward improvement. Teach the client calming/relaxation skills (e.g., applied relaxation, progressive muscle relaxation, cue controlled relaxation; mindful breathing; biofeedback) and how to discriminate better between relaxation and tension; teach the client how to apply these skills to his/her daily life. 7. Recognize, accept, and cope with feelings of depression. 8. Reduce overall frequency, intensity, and duration of the anxiety so that daily functioning is not impaired. 9. Resolve the core conflict that is the source of anxiety. 10. Stabilize anxiety level while increasing ability to function on a daily basis. Diagnosis F43.23 Conditions For Discharge Achievement of treatment goals and objectives   Willey Harrier, LCSW

## 2024-01-30 ENCOUNTER — Other Ambulatory Visit: Payer: Self-pay | Admitting: Neurology

## 2024-01-30 ENCOUNTER — Other Ambulatory Visit: Payer: Self-pay | Admitting: Family Medicine

## 2024-01-30 DIAGNOSIS — G20B1 Parkinson's disease with dyskinesia, without mention of fluctuations: Secondary | ICD-10-CM

## 2024-01-30 DIAGNOSIS — N401 Enlarged prostate with lower urinary tract symptoms: Secondary | ICD-10-CM

## 2024-02-03 ENCOUNTER — Other Ambulatory Visit: Payer: Self-pay | Admitting: Neurology

## 2024-02-03 DIAGNOSIS — G20B1 Parkinson's disease with dyskinesia, without mention of fluctuations: Secondary | ICD-10-CM
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
## 2024-02-13 ENCOUNTER — Telehealth: Admitting: Psychiatry

## 2024-02-13 ENCOUNTER — Encounter: Payer: Self-pay | Admitting: Psychiatry

## 2024-02-13 DIAGNOSIS — F3341 Major depressive disorder, recurrent, in partial remission: Secondary | ICD-10-CM | POA: Diagnosis not present

## 2024-02-13 DIAGNOSIS — F419 Anxiety disorder, unspecified: Secondary | ICD-10-CM | POA: Diagnosis not present

## 2024-02-13 DIAGNOSIS — G4700 Insomnia, unspecified: Secondary | ICD-10-CM | POA: Diagnosis not present

## 2024-02-13 DIAGNOSIS — H539 Unspecified visual disturbance: Secondary | ICD-10-CM | POA: Diagnosis not present

## 2024-02-13 MED ORDER — SERTRALINE HCL 100 MG PO TABS: 200.0000 mg | ORAL_TABLET | Freq: Every day | ORAL | 0 refills | Status: DC

## 2024-02-17 ENCOUNTER — Ambulatory Visit: Payer: Medicare Other | Admitting: Neurology

## 2024-02-23 ENCOUNTER — Ambulatory Visit (INDEPENDENT_AMBULATORY_CARE_PROVIDER_SITE_OTHER): Admitting: Psychology

## 2024-02-23 DIAGNOSIS — F4323 Adjustment disorder with mixed anxiety and depressed mood: Secondary | ICD-10-CM | POA: Diagnosis not present

## 2024-02-23 NOTE — Progress Notes (Signed)
 [name] Progress Note  Patient ID: Travis Reese, MRN: [id number],    Date: 02/23/2024  Time Spent: 2:00pm-2:45pm   45 minutes   Treatment Type: Individual Therapy  Reported Symptoms: stress  Mental Status Exam: Appearance:  Casual     Behavior: Appropriate  Motor: Normal  Speech/Language:  Normal Rate  Affect: Appropriate  Mood: normal  Thought process: normal  Thought content:   WNL  Sensory/Perceptual disturbances:   WNL  Orientation: oriented to person, place, time/date, and situation  Attention: Good  Concentration: Good  Memory: WNL  Fund of knowledge:  Good  Insight:   Good  Judgment:  Good  Impulse Control: Good   Risk Assessment: Danger to Self:  No Self-injurious Behavior: No Danger to Others: No Duty to Warn:no Physical Aggression / Violence:No  Access to Firearms a concern: No  Gang Involvement:No   Subjective: Pt present for face-to-face individual therapy via video.  Pt consents to telehealth video session and is aware of limitations and benefits of virtual sessions. Location of pt: home Location of therapist: home office.   Pt talked about his health.  He is still having balance, eyesight, and dizzyness issues.  Pt has also had headaches.  Pt states he gets down at times bc of his health and pain issues.   He also worries a lot of what he is experiencing physically and ruminates about why.  Addressed pt's feelings about his health concerns.  Worked with pt on increasing his awareness of his thoughts and mindset and how they impact his mood.   Worked on thought reframing.   Pt talked about his sleep.   Pt states he is sleeping better.  It is only taking him about 10-15 minutes to fall asleep at night and he is feeling more rested when he wakes up in the morning.   Pt talked about family issues.  His partner Santana has health issues also.  She is in constant back pain.   Pt states she has been irritable the past couple  of weeks.   Santana gets frustrated with pt and his physical limitations.   Addressed how this impacts pt.   Helped pt process his feelings and relationship dynamics.   Worked on self care strategies.  Pt tends to research on the computer and read as a distraction from his physical issues.   Provided supportive therapy.      Interventions: Cognitive Behavioral Therapy and Insight-Oriented  Diagnosis:  F43.23  Plan of Care: Recommend ongoing therapy.  Pt participated in setting treatment goals.   He would like to improve his sleep and feel less anxious and depressed.   Plan to meet every two weeks.   Pt agrees with treatment plan.    Treatment Plan Client Abilities/Strengths  Pt is engaging and motivated for therapy.  Client Treatment Preferences  Individual therapy.  Client Statement of Needs  Improve copings skills and improve sleep. Symptoms  Depressed or irritable mood. Excessive and/or unrealistic worry that is difficult to control occurring more days than not for at least 6 months about a number of events or activities. Hypervigilance (e.g., feeling constantly on edge, experiencing concentration difficulties, having trouble falling or staying asleep, exhibiting a general state of irritability). Low self-esteem. Problems Addressed  Unipolar Depression, Anxiety Goals 1. Alleviate depressive symptoms and return to previous level of effective functioning. 2. Appropriately grieve the loss in order to normalize mood and to return to previously adaptive level of functioning. Objective Learn and implement  behavioral strategies to overcome depression. Target Date: 2024-11-26 Frequency: Biweekly  Progress: 10 Modality: individual  Related Interventions Assist the client in developing skills that increase the likelihood of deriving pleasure from behavioral activation (e.g., assertiveness skills, developing an exercise plan, less internal/more external focus, increased social involvement);  reinforce success. Engage the client in behavioral activation, increasing his/her activity level and contact with sources of reward, while identifying processes that inhibit activation. use behavioral techniques such as instruction, rehearsal, role-playing, role reversal, as needed, to facilitate activity in the client's daily life; reinforce success. 3. Develop healthy interpersonal relationships that lead to the alleviation and help prevent the relapse of depression. 4. Develop healthy thinking patterns and beliefs about self, others, and the world that lead to the alleviation and help prevent the relapse of depression. 5. Enhance ability to effectively cope with the full variety of life's worries and anxieties. 6. Learn and implement coping skills that result in a reduction of anxiety and worry, and improved daily functioning. Objective Learn and implement problem-solving strategies for realistically addressing worries. Target Date: 2024-11-26 Frequency: Biweekly  Progress: 10 Modality: individual  Related Interventions Assign the client a homework exercise in which he/she problem-solves a current problem (see Mastery of Your Anxiety and Worry: Workbook by Richarda and Jonne or Generalized Anxiety Disorder by Delores Filler, and Jonne); review, reinforce success, and provide corrective feedback toward improvement. Teach the client problem-solving strategies involving specifically defining a problem, generating options for addressing it, evaluating the pros and cons of each option, selecting and implementing an optional action, and reevaluating and refining the action. Objective Learn and implement calming skills to reduce overall anxiety and manage anxiety symptoms. Target Date: 2024-11-26 Frequency: Biweekly  Progress: 10 Modality: individual  Related Interventions Assign the client to read about progressive muscle relaxation and other calming strategies in relevant books or treatment manuals  (e.g., Progressive Relaxation Training by Thornell armin Collier; Mastery of Your Anxiety and Worry: Workbook by Richarda armin Jonne). Assign the client homework each session in which he/she practices relaxation exercises daily, gradually applying them progressively from non-anxiety-provoking to anxiety-provoking situations; review and reinforce success while providing corrective feedback toward improvement. Teach the client calming/relaxation skills (e.g., applied relaxation, progressive muscle relaxation, cue controlled relaxation; mindful breathing; biofeedback) and how to discriminate better between relaxation and tension; teach the client how to apply these skills to his/her daily life. 7. Recognize, accept, and cope with feelings of depression. 8. Reduce overall frequency, intensity, and duration of the anxiety so that daily functioning is not impaired. 9. Resolve the core conflict that is the source of anxiety. 10. Stabilize anxiety level while increasing ability to function on a daily basis. Diagnosis F43.23 Conditions For Discharge Achievement of treatment goals and objectives   Veva Alma, LCSW

## 2024-02-25 DIAGNOSIS — M4807 Spinal stenosis, lumbosacral region: Secondary | ICD-10-CM | POA: Diagnosis not present

## 2024-02-25 DIAGNOSIS — M47817 Spondylosis without myelopathy or radiculopathy, lumbosacral region: Secondary | ICD-10-CM | POA: Diagnosis not present

## 2024-02-25 DIAGNOSIS — M419 Scoliosis, unspecified: Secondary | ICD-10-CM | POA: Diagnosis not present

## 2024-02-25 DIAGNOSIS — M47816 Spondylosis without myelopathy or radiculopathy, lumbar region: Secondary | ICD-10-CM | POA: Diagnosis not present

## 2024-02-25 DIAGNOSIS — M48062 Spinal stenosis, lumbar region with neurogenic claudication: Secondary | ICD-10-CM | POA: Diagnosis not present

## 2024-02-26 DIAGNOSIS — M5416 Radiculopathy, lumbar region: Secondary | ICD-10-CM | POA: Diagnosis not present

## 2024-02-26 DIAGNOSIS — M48062 Spinal stenosis, lumbar region with neurogenic claudication: Secondary | ICD-10-CM | POA: Diagnosis not present

## 2024-02-27 ENCOUNTER — Telehealth: Payer: Self-pay

## 2024-02-27 ENCOUNTER — Other Ambulatory Visit: Payer: Self-pay

## 2024-02-27 MED ORDER — ONDANSETRON HCL 4 MG PO TABS: 4.0000 mg | ORAL_TABLET | Freq: Three times a day (TID) | ORAL | 0 refills | Status: DC | PRN

## 2024-02-27 NOTE — Telephone Encounter (Signed)
 Ok per Dr. Sherre to send in.  Copied from CRM [id number]. Topic: Clinical - Medication Question >> Feb 27, 2024 12:02 PM Powell HERO wrote: Reason for CRM: Patient is wanting to see if he can get an prescription ondansetron . He sates he's been waking up nauseated. Requests a call back to advise. Did not want to schedule an appointment.

## 2024-03-01 ENCOUNTER — Other Ambulatory Visit: Payer: Self-pay | Admitting: Family Medicine

## 2024-03-08 NOTE — Progress Notes (Signed)
 " Cardiology Office Note:  .   Date:  03/10/2024  ID:  Travis Reese, DOB 14-Jan-1946, MRN [id number] PCP: Sherre Clapper, MD  [facility] Providers Cardiologist:  Lamar Fitch, MD    History of Present Illness: .   [name] is a 78 y.o. 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  12/12/2023 carotid duplex mild bilateral carotid artery stenosis 10/15/2023 echo EF 55 to 60%, grade 1 DD, aortic valve with normal structure and functioning of the prosthesis 09/18/2023 monitor average heart rate 69 bpm, brief episodes of SVT 06/14/2022 echocardiogram EF 55 to 60%, grade 1 DD, no AVR 05/08/2021 monitor revealed episodes of ventricular tachycardia at 4 bpm, no episodes of SVT or atrial fibrillation 01/22/2021 carotid ultrasound mild bilateral carotid artery disease 04/28/2018 right left heart cath revealed mild nonobstructive CAD with 40% calcified M LAD lesion, severe AI  Evaluated by Dr. Fitch on 06/04/2022, he was doing well from a cardiac perspective.  He had previously been hospitalized with multiple CVAs and found to have a PFO, he wore a long-term monitor which revealed no evidence of atrial fibrillation.  He had recently been diagnosed with Parkinson's.  An echocardiogram was arranged and completed on 06/14/2022 revealing an EF of 55 to 60%, grade 1 DD, no AVR was visualized. Evaluated 03/18/2023 --was stable at that time, episodes of dizziness, we reduced his dose of Coreg .  Admitted to [facility] on 09/10/2023 following a fall in a parking lot.  He was diagnosed with a 1.1 cm SDH, left maxillary sinus fracture, 6 rib fractures, left thumb fracture.  His aspirin  was held, he was started on Keppra eventually discharged home.    Evaluated by myself on 09/25/2023 following his syncopal event and hospitalization as outlined above, history was very hard to obtain and he presented alone, also had unintentional weight loss, also recent vision changes as well, arranged for repeat CT of his head and advised to follow-up with neurosurgery.  Most recently was evaluated by Dr. Fitch on 11/20/2023, he was stable, seem to be near his baseline but had an episode of vision loss so carotid ultrasound was arranged revealing mild carotid artery stenosis.  He presents today for follow-up.  He is doing much better than the previous times we have seen him.  He has largely recovered from his subdural hematoma.  He was started on amantadine  by his neurologist and since this time he has been plagued by fatigue, and weight loss.  He has some dizziness associated with some nausea.  From a cardiac perspective, he is stable. He denies chest pain, palpitations, dyspnea, pnd, orthopnea, n, v,  syncope, edema, weight gain, or early satiety.    ROS: Review of Systems  Constitutional:  Positive for malaise/fatigue and weight loss.  HENT: Negative.    Respiratory: Negative.    Cardiovascular: Negative.   Gastrointestinal:  Positive for nausea.  Genitourinary: Negative.   Musculoskeletal:  Positive for back pain.  Skin: Negative.   Neurological:  Positive for dizziness and loss of consciousness.  Endo/Heme/Allergies:  Bruises/bleeds easily.     Studies Reviewed: .        Cardiac Studies & Procedures   ______________________________________________________________________________________________ CARDIAC CATHETERIZATION  CARDIAC CATHETERIZATION 04/27/2018  Conclusion  Prox LAD to Mid LAD lesion is 40% stenosed.  Findings:  Ao = 104/49 (74) LV = 94/24 RA =  3 RV = 37/6 PA = 38/14 (25) PCW = 17 Fick cardiac output/index = 5.5/2.6 PVR = 1.5 WU Ao sat = 99% PA sat = 69%, 73%  Assessment: 1. Mild nonobstructive CAD with 40%  calcified mLAD lesion. 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 Normal LVEF 3. 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 Essentially normal hemodynamics  Plan/Discussion:  Proceed with AVR later this week.  Toribio Fuel, MD 4:03 PM  Findings Coronary Findings Diagnostic  Dominance: Right  Left Main Vessel is angiographically normal.  Left Anterior Descending Prox LAD to Mid LAD lesion is 40% stenosed. The lesion is calcified.  Left Circumflex Vessel is angiographically normal.  Right Coronary Artery Vessel is normal in caliber. 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  Intervention  No interventions have been documented.     ECHOCARDIOGRAM  ECHOCARDIOGRAM COMPLETE 10/15/2023  Narrative ECHOCARDIOGRAM REPORT    Patient Name:   Travis Reese Date of Exam: 10/15/2023 Medical Rec #:  [id number]       Height:       74.0 in Accession #:    [id number]      Weight:       163.0 lb Date of Birth:  1946-02-25      BSA:          1.992 m Patient Age:    77 years        BP:           155/106 mmHg Patient Gender: M               HR:           72 bpm. Exam Location:  [place]  Procedure: 2D Echo, Cardiac Doppler, Color Doppler and Strain Analysis (Both Spectral and Color Flow Doppler were utilized during procedure).  Indications:    Coronary artery disease involving native coronary artery of native heart without angina pectoris [I25.10 (ICD-10-CM)]; Syncope, unspecified syncope type [R55 (ICD-10-CM)]  History:        Patient has prior history of Echocardiogram examinations. CHF; Aortic Valve Disease. 30mm Valsalva graft. Aortic Valve: 27 mm Inspiris bioprosthetic valve is present in the aortic position. Procedure Date: 05/01/2018.  Sonographer:    Charlie Jointer RDCS Referring Phys: [phone] Osamu Olguin C Bartolo Montanye  IMPRESSIONS   1. Left ventricular ejection fraction, by estimation, is 55 to 60%. The left ventricle has normal function. The left  ventricle has no regional wall motion abnormalities. Left ventricular diastolic parameters are consistent with Grade I diastolic dysfunction (impaired relaxation). The average left ventricular global longitudinal strain is -16.3 %. The global longitudinal strain is abnormal. 2. Right ventricular systolic function is normal. The right ventricular size is normal. 3. The mitral valve is normal in structure. No evidence of mitral valve regurgitation. No evidence of mitral stenosis. 4. The aortic valve is normal in structure. Aortic valve regurgitation is not visualized. No aortic stenosis is present. There is a 27 mm Inspiris bioprosthetic valve present in the aortic position. Procedure Date: 05/01/2018. Echo findings are consistent with normal structure and function of the aortic valve prosthesis. Aortic valve mean gradient measures 4.2 mmHg. 5. The inferior vena cava is normal in size with greater than 50% respiratory variability, suggesting right atrial pressure of 3 mmHg.  FINDINGS Left Ventricle: Left ventricular ejection fraction, by estimation, is 55 to 60%. The left ventricle has normal function. The left ventricle has  no regional wall motion abnormalities. The average left ventricular global longitudinal strain is -16.3 %. Strain was performed and the global longitudinal strain is abnormal. The left ventricular internal cavity size was normal in size. There is no left ventricular hypertrophy. Left ventricular diastolic parameters are consistent with Grade I diastolic dysfunction (impaired relaxation).  Right Ventricle: The right ventricular size is normal. No increase in right ventricular wall thickness. Right ventricular systolic function is normal.  Left Atrium: Left atrial size was normal in size.  Right Atrium: Right atrial size was normal in size.  Pericardium: There is no evidence of pericardial effusion.  Mitral Valve: The mitral valve is normal in structure. No evidence of mitral  valve regurgitation. No evidence of mitral valve stenosis.  Tricuspid Valve: The tricuspid valve is normal in structure. Tricuspid valve regurgitation is trivial. No evidence of tricuspid stenosis.  Aortic Valve: The aortic valve is normal in structure. Aortic valve regurgitation is not visualized. No aortic stenosis is present. Aortic valve mean gradient measures 4.2 mmHg. Aortic valve peak gradient measures 7.9 mmHg. There is a 27 mm Inspiris bioprosthetic valve present in the aortic position. Procedure Date: 05/01/2018. Echo findings are consistent with normal structure and function of the aortic valve prosthesis.  Pulmonic Valve: The pulmonic valve was normal in structure. Pulmonic valve regurgitation is not visualized. No evidence of pulmonic stenosis.  Aorta: The aortic root is normal in size and structure.  Venous: The inferior vena cava is normal in size with greater than 50% respiratory variability, suggesting right atrial pressure of 3 mmHg.  IAS/Shunts: No atrial level shunt detected by color flow Doppler.  Additional Comments: 3D imaging was not performed.   LEFT VENTRICLE PLAX 2D LVIDd:         4.20 cm Diastology LVIDs:         3.00 cm LV e' medial:    8.78 cm/s LV PW:         1.20 cm LV E/e' medial:  6.9 LV IVS:        1.20 cm LV e' lateral:   9.20 cm/s LV E/e' lateral: 6.6  2D Longitudinal Strain 2D Strain GLS Avg:     -16.3 %  RIGHT VENTRICLE             IVC RV Basal diam:  4.00 cm     IVC diam: 1.30 cm RV Mid diam:    3.10 cm RV S prime:     11.00 cm/s TAPSE (M-mode): 2.1 cm  LEFT ATRIUM             Index        RIGHT ATRIUM           Index LA diam:        3.10 cm 1.56 cm/m   RA Area:     13.20 cm LA Vol (A2C):   43.4 ml 21.78 ml/m  RA Volume:   36.10 ml  18.12 ml/m LA Vol (A4C):   56.9 ml 28.56 ml/m LA Biplane Vol: 51.1 ml 25.65 ml/m AORTIC VALVE AV Vmax:           140.60 cm/s AV Vmean:          92.600 cm/s AV VTI:            0.266 m AV Peak Grad:       7.9 mmHg AV Mean Grad:      4.2 mmHg LVOT Vmax:         114.25 cm/s LVOT  Vmean:        71.125 cm/s LVOT VTI:          0.220 m LVOT/AV VTI ratio: 0.83  AORTA Ao Root diam: 3.30 cm Ao Desc diam: 3.10 cm  MITRAL VALVE MV Area (PHT): 2.99 cm    SHUNTS MV Decel Time: 254 msec    Systemic VTI: 0.22 m MV E velocity: 60.30 cm/s MV A velocity: 82.30 cm/s MV E/A ratio:  0.73  Lamar Fitch MD Electronically signed by Lamar Fitch MD Signature Date/Time: 10/15/2023/2:57:54 PM    Final   TEE  ECHO TEE 04/13/2021  Narrative TRANSESOPHOGEAL ECHO REPORT    Patient Name:   JABRI BLANCETT Date of Exam: 04/13/2021 Medical Rec #:  [id number]       Height:       74.0 in Accession #:    [id number]      Weight:       187.0 lb Date of Birth:  April 29, 1946      BSA:          2.112 m Patient Age:    74 years        BP:           161/93 mmHg Patient Gender: M               HR:           58 bpm. Exam Location:  Inpatient  Procedure: 3D Echo, Transesophageal Echo, Cardiac Doppler, Color Doppler and Saline Contrast Bubble Study  Indications:     Rule out PFO  History:         Patient has prior history of Echocardiogram examinations, most recent 04/08/2019. Hx stroke. Aortic Valve: 27 mm Edwards bovine valve is present in the aortic position. Procedure Date: 05/01/2017.  Sonographer:     Rome Eans RDCS (AE) Referring Phys:  [id number] BRIDGETTE CHRISTOPHER Diagnosing Phys: Shelda Bruckner MD  PROCEDURE: After discussion of the risks and benefits of a TEE, an informed consent was obtained from the patient. The transesophogeal probe was passed without difficulty through the esophogus of the patient. Sedation performed by different physician. The patient was monitored while under deep sedation. Anesthestetic sedation was provided intravenously by Anesthesiology: 231.4mg  of Propofol . Image quality was good. The patient's vital signs; including heart rate, blood pressure, and oxygen  saturation; remained stable throughout the procedure. The patient developed no complications during the procedure.  IMPRESSIONS   1. Left ventricular ejection fraction, by estimation, is 55 to 60%. The left ventricle has normal function. The left ventricle has no regional wall motion abnormalities. 2. Right ventricular systolic function is normal. The right ventricular size is normal. 3. No left atrial/left atrial appendage thrombus was detected. The LAA emptying velocity was 70 cm/s. 4. The mitral valve is normal in structure. Trivial mitral valve regurgitation. No evidence of mitral stenosis. 5. The aortic valve has been repaired/replaced. Aortic valve regurgitation is trivial. No aortic stenosis is present. There is a 27 mm Edwards bovine valve present in the aortic position. Procedure Date: 05/01/2017. 6. Aortic root/ascending aorta has been repaired/replaced. There is Moderate (Grade III) plaque involving the descending aorta. 7. Evidence of atrial level shunting detected by color flow Doppler. Agitated saline contrast bubble study was positive with shunting observed within 3-6 cardiac cycles suggestive of interatrial shunt. There is a moderately sized patent foramen ovale with predominantly right to left shunting across the atrial septum.  Conclusion(s)/Recommendation(s): Study positive for patient foramen ovale, with right to left shunting  by color doppler and agitated saline.  FINDINGS Left Ventricle: Left ventricular ejection fraction, by estimation, is 55 to 60%. The left ventricle has normal function. The left ventricle has no regional wall motion abnormalities. The left ventricular internal cavity size was normal in size.  Right Ventricle: The right ventricular size is normal. No increase in right ventricular wall thickness. Right ventricular systolic function is normal.  Left Atrium: Left atrial size was normal in size. No left atrial/left atrial appendage thrombus was detected.  The LAA emptying velocity was 70 cm/s.  Right Atrium: Right atrial size was normal in size.  Pericardium: There is no evidence of pericardial effusion.  Mitral Valve: The mitral valve is normal in structure. Trivial mitral valve regurgitation. No evidence of mitral valve stenosis. There is no evidence of mitral valve vegetation.  Tricuspid Valve: The tricuspid valve is normal in structure. Tricuspid valve regurgitation is trivial. No evidence of tricuspid stenosis. There is no evidence of tricuspid valve vegetation.  Aortic Valve: Overall normal functioning prosthesis with only trivial central regurgitation. The aortic valve has been repaired/replaced. Aortic valve regurgitation is trivial. No aortic stenosis is present. Aortic valve mean gradient measures 7.0 mmHg. Aortic valve peak gradient measures 14.1 mmHg. There is a 27 mm Edwards bovine valve present in the aortic position. Procedure Date: 05/01/2017. There is no evidence of aortic valve vegetation.  Pulmonic Valve: The pulmonic valve was grossly normal. Pulmonic valve regurgitation is not visualized. No evidence of pulmonic stenosis. There is no evidence of pulmonic valve vegetation.  Aorta: The aortic root/ascending aorta has been repaired/replaced. There is moderate (Grade III) plaque involving the descending aorta.  IAS/Shunts: The interatrial septum is aneurysmal. Evidence of atrial level shunting detected by color flow Doppler. Agitated saline contrast was given intravenously to evaluate for intracardiac shunting. Agitated saline contrast bubble study was positive with shunting observed within 3-6 cardiac cycles suggestive of interatrial shunt. A moderately sized patent foramen ovale is detected with predominantly right to left shunting across the atrial septum.   AORTIC VALVE AV Vmax:      188.00 cm/s AV Vmean:     123.000 cm/s AV VTI:       0.435 m AV Peak Grad: 14.1 mmHg AV Mean Grad: 7.0 mmHg  Shelda Bruckner  MD Electronically signed by Shelda Bruckner MD Signature Date/Time: 04/13/2021/2:09:09 PM    Final  MONITORS  LONG TERM MONITOR (3-14 DAYS) 09/15/2023  Narrative 14 day monitor  Sinus rhythm HR 49-101, avg 69 bpm Occasional supraventricular ectopy (2.3%) Rare PVCs (<1%) Brief SVT events occurred, the longest lasted 10.7 seconds. No patient symptom episodes reported.       ______________________________________________________________________________________________      Risk Assessment/Calculations:             Physical Exam:   VS:  BP 130/88   Pulse 73   Ht 6' (1.829 m)   Wt 166 lb (75.3 kg)   SpO2 95%   BMI 22.51 kg/m    Wt Readings from Last 3 Encounters:  03/09/24 166 lb (75.3 kg)  01/22/24 165 lb 3.2 oz (74.9 kg)  12/30/23 162 lb (73.5 kg)    GEN: Well nourished, well developed in no acute distress NECK: No JVD; No carotid bruits CARDIAC: RRR, no murmurs, rubs, gallops RESPIRATORY:  Clear to auscultation without rales, wheezing or rhonchi  ABDOMEN: Soft, non-tender, non-distended EXTREMITIES:  No edema; No deformity   ASSESSMENT AND PLAN: .   Coronary artery disease-nonobstructive per LHC in 2019, Stable with no  anginal symptoms. No indication for ischemic evaluation.  Continue aspirin  81 mg daily, continue Crestor 20 mg daily, continue Coreg  but we will reduce his dose to 3.125 mg twice daily.  Aortic valve replacement/Bentall procedure-s/p 2019, most recent echo revealed no AVR.  Continue SBE prophylaxis.  Blood pressure is well-controlled.    HFpEF-most recent echo in October 2023 revealed EF 55 to 60%, NYHA class I, euvolemic, continue Coreg  3.25 mg twice daily.  Hypotension -multifactorial related to Parkinson's disease.  Advised him to get compression socks and wear during the day, also increase his oral hydration.  Hyperlipidemia-most recent LDL was well-controlled at 41,continue atorvastatin  20 mg daily.  Unintentional weight loss/nausea -  suspect it is from his amantadine , which was started in February and coincides with his weight loss and nausea. Encouraged him to speak to his neurologist about this and then follow up with PCP if problem persists.  Parkinsons disease - stable, follows with Neurology (Dr. Evonnie)       Dispo: follow up in 6 months.   Signed, Delon JAYSON Hoover, NP  "

## 2024-03-09 ENCOUNTER — Ambulatory Visit: Attending: Cardiology | Admitting: Cardiology

## 2024-03-09 ENCOUNTER — Other Ambulatory Visit: Payer: Self-pay | Admitting: *Deleted

## 2024-03-09 ENCOUNTER — Encounter: Payer: Self-pay | Admitting: Cardiology

## 2024-03-09 ENCOUNTER — Other Ambulatory Visit: Payer: Self-pay | Admitting: Psychiatry

## 2024-03-09 VITALS — BP 130/88 | HR 73 | Ht 72.0 in | Wt 166.0 lb

## 2024-03-09 DIAGNOSIS — R55 Syncope and collapse: Secondary | ICD-10-CM

## 2024-03-09 DIAGNOSIS — G20A2 Parkinson's disease without dyskinesia, with fluctuations: Secondary | ICD-10-CM

## 2024-03-09 DIAGNOSIS — I2510 Atherosclerotic heart disease of native coronary artery without angina pectoris: Secondary | ICD-10-CM | POA: Diagnosis not present

## 2024-03-09 DIAGNOSIS — Z953 Presence of xenogenic heart valve: Secondary | ICD-10-CM

## 2024-03-09 DIAGNOSIS — I9589 Other hypotension: Secondary | ICD-10-CM

## 2024-03-09 DIAGNOSIS — I5032 Chronic diastolic (congestive) heart failure: Secondary | ICD-10-CM

## 2024-03-09 DIAGNOSIS — R634 Abnormal weight loss: Secondary | ICD-10-CM | POA: Diagnosis not present

## 2024-03-09 DIAGNOSIS — I10 Essential (primary) hypertension: Secondary | ICD-10-CM

## 2024-03-09 NOTE — Patient Instructions (Signed)
 Medication Instructions:   No changes.   *If you need a refill on your cardiac medications before your next appointment, please call your pharmacy*  Lab Work:  None today.   If you have labs (blood work) drawn today and your tests are completely normal, you will receive your results only by: MyChart Message (if you have MyChart) OR A paper copy in the mail If you have any lab test that is abnormal or we need to change your treatment, we will call you to review the results.  Testing/Procedures:  None today.   Follow-Up: At [facility], you and your health needs are our priority.  As part of our continuing mission to provide you with exceptional heart care, our providers are all part of one team.  This team includes your primary Cardiologist (physician) and Advanced Practice Providers or APPs (Physician Assistants and Nurse Practitioners) who all work together to provide you with the care you need, when you need it.  Your next appointment:   6 month(s)  Provider:   Lamar Fitch, MD    We recommend signing up for the patient portal called MyChart.  Sign up information is provided on this After Visit Summary.  MyChart is used to connect with patients for Virtual Visits (Telemedicine).  Patients are able to view lab/test results, encounter notes, upcoming appointments, etc.  Non-urgent messages can be sent to your provider as well.   To learn more about what you can do with MyChart, go to [email or web address].   Other Instructions  Call Dr. Evonnie and notify their office of your nausea and weight loss following the start of Amantadine .   Follow up with Dr. Sherre if your nausea persists after speaking with Dr. Evonnie.

## 2024-03-11 ENCOUNTER — Telehealth: Payer: Self-pay | Admitting: Neurology

## 2024-03-11 NOTE — Telephone Encounter (Signed)
 Pt called in stating he has been having nausea and balance issues. His heart doctor thought the amantadine  could be causing it.

## 2024-03-11 NOTE — Telephone Encounter (Signed)
Called patient and let him know Dr. Arturo Morton recommendations
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
## 2024-03-23 DIAGNOSIS — M48062 Spinal stenosis, lumbar region with neurogenic claudication: Secondary | ICD-10-CM | POA: Diagnosis not present

## 2024-03-23 DIAGNOSIS — M5416 Radiculopathy, lumbar region: Secondary | ICD-10-CM | POA: Diagnosis not present

## 2024-03-24 ENCOUNTER — Encounter: Payer: Self-pay | Admitting: Psychiatry

## 2024-03-24 ENCOUNTER — Telehealth: Admitting: Psychiatry

## 2024-03-24 DIAGNOSIS — F419 Anxiety disorder, unspecified: Secondary | ICD-10-CM

## 2024-03-24 DIAGNOSIS — F3341 Major depressive disorder, recurrent, in partial remission: Secondary | ICD-10-CM

## 2024-03-24 DIAGNOSIS — G4700 Insomnia, unspecified: Secondary | ICD-10-CM | POA: Diagnosis not present

## 2024-03-24 MED ORDER — SERTRALINE HCL 100 MG PO TABS: 200.0000 mg | ORAL_TABLET | Freq: Every day | ORAL | 0 refills | Status: DC

## 2024-03-24 NOTE — Patient Instructions (Signed)
 Continue sertraline  200 mg at night Continue bupropion  150 mg daily  Next appointment: 9/17 at 11 30

## 2024-03-29 ENCOUNTER — Ambulatory Visit (INDEPENDENT_AMBULATORY_CARE_PROVIDER_SITE_OTHER): Admitting: Psychology

## 2024-03-29 DIAGNOSIS — F4323 Adjustment disorder with mixed anxiety and depressed mood: Secondary | ICD-10-CM

## 2024-03-29 NOTE — Progress Notes (Signed)
 [facility] Counselor/Therapist Progress Note  Patient ID: Travis Reese, MRN: [id number],    Date: 03/29/2024  Time Spent: 3:00pm-3:45pm   45 minutes   Treatment Type: Individual Therapy  Reported Symptoms: stress  Mental Status Exam: Appearance:  Casual     Behavior: Appropriate  Motor: Normal  Speech/Language:  Normal Rate  Affect: Appropriate  Mood: normal  Thought process: normal  Thought content:   WNL  Sensory/Perceptual disturbances:   WNL  Orientation: oriented to person, place, time/date, and situation  Attention: Good  Concentration: Good  Memory: WNL  Fund of knowledge:  Good  Insight:   Good  Judgment:  Good  Impulse Control: Good   Risk Assessment: Danger to Self:  No Self-injurious Behavior: No Danger to Others: No Duty to Warn:no Physical Aggression / Violence:No  Access to Firearms a concern: No  Gang Involvement:No   Subjective: Pt present for face-to-face individual therapy via video.  Pt consents to telehealth video session and is aware of limitations and benefits of virtual sessions. Location of pt: home Location of therapist: home office.   Pt talked about his health.  He has been feeling better bc he has not had as much nausea and he had back injections that have reduced his pain.   He has also been sleeping better.  It is only taking him about 10-15 minutes to fall asleep at night and he is feeling more rested when he wakes up in the morning.  He gets about 8 hours of sleep most nights.  Pt states his mood has improved bc he has been feeling better physically.   Pt talked about family issues.  His partner Santana has health issues also.  She is in constant back pain.   Pt states she has been critical of pt and has had a negative attitude.   Santana gets frustrated with pt and his physical limitations.   Addressed how this impacts pt.  Pt has tried to talk to Santana about his feelings but she gets defensive.  Helped pt process his  feelings and relationship dynamics.   Worked on self care strategies.  Provided supportive therapy.      Interventions: Cognitive Behavioral Therapy and Insight-Oriented  Diagnosis:  F43.23  Plan of Care: Recommend ongoing therapy.  Pt participated in setting treatment goals.   He would like to improve his sleep and feel less anxious and depressed.   Plan to meet every two weeks.   Pt agrees with treatment plan.    Treatment Plan Client Abilities/Strengths  Pt is engaging and motivated for therapy.  Client Treatment Preferences  Individual therapy.  Client Statement of Needs  Improve copings skills and improve sleep. Symptoms  Depressed or irritable mood. Excessive and/or unrealistic worry that is difficult to control occurring more days than not for at least 6 months about a number of events or activities. Hypervigilance (e.g., feeling constantly on edge, experiencing concentration difficulties, having trouble falling or staying asleep, exhibiting a general state of irritability). Low self-esteem. Problems Addressed  Unipolar Depression, Anxiety Goals 1. Alleviate depressive symptoms and return to previous level of effective functioning. 2. Appropriately grieve the loss in order to normalize mood and to return to previously adaptive level of functioning. Objective Learn and implement behavioral strategies to overcome depression. Target Date: 2024-11-26 Frequency: Biweekly  Progress: 10 Modality: individual  Related Interventions Assist the client in developing skills that increase the likelihood of deriving pleasure from behavioral activation (e.g., assertiveness skills, developing an exercise plan,  less internal/more external focus, increased social involvement); reinforce success. Engage the client in behavioral activation, increasing his/her activity level and contact with sources of reward, while identifying processes that inhibit activation. use behavioral techniques such as  instruction, rehearsal, role-playing, role reversal, as needed, to facilitate activity in the client's daily life; reinforce success. 3. Develop healthy interpersonal relationships that lead to the alleviation and help prevent the relapse of depression. 4. Develop healthy thinking patterns and beliefs about self, others, and the world that lead to the alleviation and help prevent the relapse of depression. 5. Enhance ability to effectively cope with the full variety of life's worries and anxieties. 6. Learn and implement coping skills that result in a reduction of anxiety and worry, and improved daily functioning. Objective Learn and implement problem-solving strategies for realistically addressing worries. Target Date: 2024-11-26 Frequency: Biweekly  Progress: 10 Modality: individual  Related Interventions Assign the client a homework exercise in which he/she problem-solves a current problem (see Mastery of Your Anxiety and Worry: Workbook by Richarda and Jonne or Generalized Anxiety Disorder by Delores Filler, and Jonne); review, reinforce success, and provide corrective feedback toward improvement. Teach the client problem-solving strategies involving specifically defining a problem, generating options for addressing it, evaluating the pros and cons of each option, selecting and implementing an optional action, and reevaluating and refining the action. Objective Learn and implement calming skills to reduce overall anxiety and manage anxiety symptoms. Target Date: 2024-11-26 Frequency: Biweekly  Progress: 10 Modality: individual  Related Interventions Assign the client to read about progressive muscle relaxation and other calming strategies in relevant books or treatment manuals (e.g., Progressive Relaxation Training by Thornell armin Collier; Mastery of Your Anxiety and Worry: Workbook by Richarda armin Jonne). Assign the client homework each session in which he/she practices relaxation exercises  daily, gradually applying them progressively from non-anxiety-provoking to anxiety-provoking situations; review and reinforce success while providing corrective feedback toward improvement. Teach the client calming/relaxation skills (e.g., applied relaxation, progressive muscle relaxation, cue controlled relaxation; mindful breathing; biofeedback) and how to discriminate better between relaxation and tension; teach the client how to apply these skills to his/her daily life. 7. Recognize, accept, and cope with feelings of depression. 8. Reduce overall frequency, intensity, and duration of the anxiety so that daily functioning is not impaired. 9. Resolve the core conflict that is the source of anxiety. 10. Stabilize anxiety level while increasing ability to function on a daily basis. Diagnosis F43.23 Conditions For Discharge Achievement of treatment goals and objectives   Veva Alma, LCSW

## 2024-04-22 ENCOUNTER — Other Ambulatory Visit: Payer: Self-pay | Admitting: Neurology

## 2024-04-22 ENCOUNTER — Other Ambulatory Visit: Payer: Self-pay | Admitting: Family Medicine

## 2024-04-22 DIAGNOSIS — G20B1 Parkinson's disease with dyskinesia, without mention of fluctuations: Secondary | ICD-10-CM

## 2024-04-27 ENCOUNTER — Ambulatory Visit (INDEPENDENT_AMBULATORY_CARE_PROVIDER_SITE_OTHER): Admitting: Psychology

## 2024-04-27 ENCOUNTER — Ambulatory Visit: Payer: Self-pay

## 2024-04-27 DIAGNOSIS — F4323 Adjustment disorder with mixed anxiety and depressed mood: Secondary | ICD-10-CM | POA: Diagnosis not present

## 2024-04-27 NOTE — Progress Notes (Addendum)
  [facility]al [facility] Counselor/Therapist Progress Note  Patient ID: ZALMEN WRIGHTSMAN, MRN: [id number],    Date: 04/27/2024  Time Spent: 3:00pm-3:45pm   45 minutes   Treatment Type: Individual Therapy  Reported Symptoms: stress, anxiety  Mental Status Exam: Appearance:  Casual     Behavior: Appropriate  Motor: Normal  Speech/Language:  Normal Rate  Affect: Appropriate  Mood: normal  Thought process: normal  Thought content:   WNL  Sensory/Perceptual disturbances:   WNL  Orientation: oriented to person, place, time/date, and situation  Attention: Good  Concentration: Good  Memory: WNL  Fund of knowledge:  Good  Insight:   Good  Judgment:  Good  Impulse Control: Good   Risk Assessment: Danger to Self:  No Self-injurious Behavior: No Danger to Others: No Duty to Warn:no Physical Aggression / Violence:No  Access to Firearms a concern: No  Gang Involvement:No   Subjective: Pt present for face-to-face individual therapy via video.  Pt consents to telehealth video session and is aware of limitations and benefits of virtual sessions. Location of pt: home Location of therapist: home office.   Pt talked about his health.  He has been feeling better bc he has not had as much nausea and he had back injections that have reduced his pain.   He still has issues with his eyes.  He sees double and his vision is out of focus at times.   Pt feels frustrated that the doctors don't seem to know what to do to aleviate his symptoms.  Pt has a couple of wounds that won't heal.  He plans to call for an appointment with his PCP today.   Pt states he has felt anxious at times when he can't do the things he use to be able to do.  He misses driving.  Worked with pt on calming strategies and educated him about diaphramatic breathing.  Helped pt list the things he can still do that he is grateful for.   Pt talked about family issues.  His partner Santana has been doing better.   Pt states he had a  talk with her about the way she criticizes him.  He expressed his feelings and points out to her anytime she is critical.   She did not get defensive as pt had expected.   Pt is proud of himself for taking the risk to have the conversation and is feeling good that their relationship has improved.    Helped pt process relationship dynamics.   Worked on self care strategies.  Provided supportive therapy.      Interventions: Cognitive Behavioral Therapy and Insight-Oriented  Diagnosis:  F43.23  Plan of Care: Recommend ongoing therapy.  Pt participated in setting treatment goals.   He would like to improve his sleep and feel less anxious and depressed.   Plan to meet every two weeks.   Pt agrees with treatment plan.    Treatment Plan Client Abilities/Strengths  Pt is engaging and motivated for therapy.  Client Treatment Preferences  Individual therapy.  Client Statement of Needs  Improve copings skills and improve sleep. Symptoms  Depressed or irritable mood. Excessive and/or unrealistic worry that is difficult to control occurring more days than not for at least 6 months about a number of events or activities. Hypervigilance (e.g., feeling constantly on edge, experiencing concentration difficulties, having trouble falling or staying asleep, exhibiting a general state of irritability). Low self-esteem. Problems Addressed  Unipolar Depression, Anxiety Goals 1. Alleviate depressive symptoms and return to previous  level of effective functioning. 2. Appropriately grieve the loss in order to normalize mood and to return to previously adaptive level of functioning. Objective Learn and implement behavioral strategies to overcome depression. Target Date: 2024-11-26 Frequency: Biweekly  Progress: 10 Modality: individual  Related Interventions Assist the client in developing skills that increase the likelihood of deriving pleasure from behavioral activation (e.g., assertiveness skills, developing an  exercise plan, less internal/more external focus, increased social involvement); reinforce success. Engage the client in behavioral activation, increasing his/her activity level and contact with sources of reward, while identifying processes that inhibit activation. use behavioral techniques such as instruction, rehearsal, role-playing, role reversal, as needed, to facilitate activity in the client's daily life; reinforce success. 3. Develop healthy interpersonal relationships that lead to the alleviation and help prevent the relapse of depression. 4. Develop healthy thinking patterns and beliefs about self, others, and the world that lead to the alleviation and help prevent the relapse of depression. 5. Enhance ability to effectively cope with the full variety of life's worries and anxieties. 6. Learn and implement coping skills that result in a reduction of anxiety and worry, and improved daily functioning. Objective Learn and implement problem-solving strategies for realistically addressing worries. Target Date: 2024-11-26 Frequency: Biweekly  Progress: 10 Modality: individual  Related Interventions Assign the client a homework exercise in which he/she problem-solves a current problem (see Mastery of Your Anxiety and Worry: Workbook by Richarda and Jonne or Generalized Anxiety Disorder by Delores Filler, and Jonne); review, reinforce success, and provide corrective feedback toward improvement. Teach the client problem-solving strategies involving specifically defining a problem, generating options for addressing it, evaluating the pros and cons of each option, selecting and implementing an optional action, and reevaluating and refining the action. Objective Learn and implement calming skills to reduce overall anxiety and manage anxiety symptoms. Target Date: 2024-11-26 Frequency: Biweekly  Progress: 10 Modality: individual  Related Interventions Assign the client to read about progressive  muscle relaxation and other calming strategies in relevant books or treatment manuals (e.g., Progressive Relaxation Training by Thornell armin Collier; Mastery of Your Anxiety and Worry: Workbook by Richarda armin Jonne). Assign the client homework each session in which he/she practices relaxation exercises daily, gradually applying them progressively from non-anxiety-provoking to anxiety-provoking situations; review and reinforce success while providing corrective feedback toward improvement. Teach the client calming/relaxation skills (e.g., applied relaxation, progressive muscle relaxation, cue controlled relaxation; mindful breathing; biofeedback) and how to discriminate better between relaxation and tension; teach the client how to apply these skills to his/her daily life. 7. Recognize, accept, and cope with feelings of depression. 8. Reduce overall frequency, intensity, and duration of the anxiety so that daily functioning is not impaired. 9. Resolve the core conflict that is the source of anxiety. 10. Stabilize anxiety level while increasing ability to function on a daily basis. Diagnosis F43.23 Conditions For Discharge Achievement of treatment goals and objectives   Veva Alma, LCSW

## 2024-04-27 NOTE — Telephone Encounter (Signed)
 FYI Only or Action Required?: FYI only for provider.  Patient was last seen in primary care on 12/30/2023 by Sirivol, Mamatha, MD.  Called Nurse Triage reporting Sore.  Symptoms began about a month ago.  Symptoms are: gradually worsening.  Triage Disposition: See Physician Within 24 Hours  Patient/caregiver understands and will follow disposition?: Yes       Copied from CRM [id number]. Topic: Clinical - Red Word Triage >> Apr 27, 2024  4:26 PM Fonda T wrote: Red Word that prompted transfer to Nurse Triage:  Received call from patient, reports has 2-3 wounds one on elbow, one on left hand, and one in buttocks area, that are not healing, with discharge and painful.         Reason for Disposition  [1] Unexplained sores AND [2] 3 or more  Answer Assessment - Initial Assessment Questions 1. APPEARANCE of SORES: What do the sores look like?     Some redness with pus present  2. NUMBER: How many sores are there?     3 3. SIZE: How big is the largest sore?     About the size of a silver dollar  4. LOCATION: Where are the sores located?     Left hand, left elbow, buttocks  5. ONSET: When did the sores begin?     About a month ago  6. TENDER: Does it hurt when you touch it?  (Scale 1-10; or mild, moderate, severe)      Mild to moderate  7. CAUSE: What do you think is causing the sores?     Unsure  8. OTHER SYMPTOMS: Do you have any other symptoms? (e.g., fever, new weakness)     No  Protocols used: Sores-A-AH

## 2024-04-28 ENCOUNTER — Encounter: Payer: Self-pay | Admitting: Family Medicine

## 2024-04-28 ENCOUNTER — Ambulatory Visit (INDEPENDENT_AMBULATORY_CARE_PROVIDER_SITE_OTHER): Admitting: Family Medicine

## 2024-04-28 VITALS — BP 130/80 | HR 70 | Temp 98.1°F | Ht 73.0 in | Wt 170.0 lb

## 2024-04-28 DIAGNOSIS — G20A2 Parkinson's disease without dyskinesia, with fluctuations: Secondary | ICD-10-CM

## 2024-04-28 DIAGNOSIS — R110 Nausea: Secondary | ICD-10-CM

## 2024-04-28 DIAGNOSIS — E782 Mixed hyperlipidemia: Secondary | ICD-10-CM | POA: Diagnosis not present

## 2024-04-28 DIAGNOSIS — L89152 Pressure ulcer of sacral region, stage 2: Secondary | ICD-10-CM | POA: Diagnosis not present

## 2024-04-28 DIAGNOSIS — I5032 Chronic diastolic (congestive) heart failure: Secondary | ICD-10-CM | POA: Diagnosis not present

## 2024-04-28 DIAGNOSIS — H532 Diplopia: Secondary | ICD-10-CM

## 2024-04-28 DIAGNOSIS — S61208A Unspecified open wound of other finger without damage to nail, initial encounter: Secondary | ICD-10-CM | POA: Diagnosis not present

## 2024-04-28 DIAGNOSIS — I110 Hypertensive heart disease with heart failure: Secondary | ICD-10-CM

## 2024-04-28 DIAGNOSIS — Z720 Tobacco use: Secondary | ICD-10-CM

## 2024-04-28 MED ORDER — ONDANSETRON HCL 4 MG PO TABS: 4.0000 mg | ORAL_TABLET | Freq: Three times a day (TID) | ORAL | 1 refills | Status: DC | PRN

## 2024-04-28 MED ORDER — MUPIROCIN 2 % EX OINT: 1.0000 | TOPICAL_OINTMENT | Freq: Two times a day (BID) | CUTANEOUS | 0 refills | Status: AC

## 2024-04-28 NOTE — Assessment & Plan Note (Signed)
 Nausea with intermittent diplopia under investigation.  Managed nausea with medication. - Prescribed additional nausea medication.

## 2024-04-28 NOTE — Assessment & Plan Note (Signed)
 Parkinson's disease managed with amantadine  and Sinemet . Symptoms include chronic leg pain and short-term memory issues. Family history of Parkinson's disease. - Continue current Parkinson's medications.

## 2024-04-28 NOTE — Assessment & Plan Note (Signed)
 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 Possible allergic reaction to Neosporin. Smoking may impair healing. - Prescribed antibiotic ointment for left index finger and elbow. - Recommended sacral cushion to relieve pressure. - Referred to wound care center. - Advised discontinuation of Neosporin. - Encouraged smoking cessation.

## 2024-04-28 NOTE — Patient Instructions (Addendum)
  VISIT SUMMARY: You visited us  today for non-healing sores on your left index finger, left elbow, and buttocks, as well as nausea, visual disturbances, and unintentional weight loss. We discussed your smoking habits and previous attempts to quit.  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 These sores have not been healing well, and you might be having an allergic reaction to Neosporin. -Stop using Neosporin on your sores. Could be causing a reaction. -Use the prescribed mupirocin  antibiotic ointment on your left index finger and elbow. May use on sacrum until seen by wound care.  -Use a sacral cushion to relieve pressure on the sores on your buttocks. -You have been referred to a wound care center for further treatment. -Try to quit smoking, as it may be impairing your healing.  NAUSEA WITH INTERMITTENT DIPLOPIA: You have been experiencing nausea and occasional double vision or difficulty focusing. -Continue taking your current nausea medication. -You have been prescribed additional medication to help manage your nausea.  TOBACCO USE: You continue to smoke about half a pack of cigarettes per day, and previous attempts to quit have been unsuccessful. -Consider trying different methods to quit smoking, as it can affect your overall health and healing process.  If I have ordered a referral, lab work, or a test, please watch for messages/letters in your [place]. Please be aware of unknown numbers, as this may be a specialist's office attempting to call and schedule your appointment. You may wish to enter the specialist's phone number in your contacts, so your phone will not block the calls as SPAM. If you have NOT been contacted with in 1 week: Call [facility].                      Contains text generated by [employer].                                 Contains text  generated by [employer].

## 2024-04-28 NOTE — Assessment & Plan Note (Addendum)
 Continues to smoke half a pack per day. Previous cessation attempts with Chantix and nicotine  patches were unsuccessful. Encouraged cessation. Patient refused at this time.

## 2024-04-28 NOTE — Assessment & Plan Note (Signed)
Well controlled.  No changes to medicines. Continue atorvastatin 20 mg daily and krill oil. Continue to work on eating a healthy diet and exercise.  Labs drawn today.

## 2024-04-28 NOTE — Assessment & Plan Note (Addendum)
 Well controlled.  No changes to medicines. Continue Coreg  3.125 mg TWICE DAILY and Lasix  20 mg daily. Continue to work on eating a healthy diet and exercise.  Labs drawn today.

## 2024-04-28 NOTE — Progress Notes (Signed)
 Acute Office Visit  Subjective:    Patient ID: Travis Reese, male    DOB: [birth year], 78 y.o.   MRN: [id number]  Chief Complaint  Patient presents with   Unhealed sores    Top of buttocks (x 1 month), left elbow and left hand (1st knuckle) x 2 weeks   Discussed the use of AI scribe software for clinical note transcription with the patient, who gave verbal consent to proceed.  History of Present Illness   Travis Reese is a 78 year old male who presents with non-healing sores on his left index finger, left elbow, and buttocks.  Non-healing cutaneous ulcers - Non-healing sores present on left index finger, left elbow, and buttocks - Sore on left index finger present for a couple of weeks - Applies Neosporin to left index finger sore and changes bandages every other day - Sore on left elbow appeared around the same time as finger sore - Attributes finger and elbow sores to minor injuries sustained while riding and getting 'skinned up' under bushes - Sores on buttocks located at the top and extend towards the anus - Applies barrier cream to buttocks sores; Neosporin application worsens tenderness of these sores  Nausea and visual disturbances - Experiences nausea, managed with medication every other day - Episodes of double vision or difficulty focusing, which contribute to nausea - Under care of specialist for these symptoms  Unintentional weight loss - History of weight loss with recent partial regain - Previously wore size 40 or 42 pants, now wears size 36, which remain loose  Tobacco use - Smokes approximately half a pack of cigarettes per day - Previous attempts to quit with Chantix resulted in irritability - Nicotine  patches previously tried without success     Parkinson's disease:  Current Medications: Sinemet  IR 25-100mg  take 2 tablets TID, Sinemet  CR 50-200 1 tablet at bedtime. Fairly stable. Also on amantadine  100 mg twice daily.  Hyperlipidemia: Taking  Atorvastatin  20 mg daily, Krill oil.   BPH: Current medications: Tamsulosin  0.4 mg 2 in the evening.   Bipolar Depression:  Current Medications: zoleft 100 mg two tablets daily, Wellbutrin  XL 150 mg  daily. He sees Dr Vickey. His mood is good.   HYPERTENSIVE CHF: Coreg  3.125 mg TWICE DAILY AND FUROSEMIDE  20 MG every day.  The patient has stopped taking spironolactone  due to a lack of swelling in the ankles.  Back pain: on celebrex  200 mg daily.   Past Medical History:  Diagnosis Date   Abnormal sputum color 07/22/2022   Acute non-recurrent maxillary sinusitis 06/29/2023   Allergic rhinitis due to allergen 07/22/2022   Anxiety    Aortic valve sclerosis    Arthritis    knees, neck, shoulder   Atherosclerosis of aorta (HCC) 09/29/2021   B12 deficiency 09/29/2021   Benign hypertension 07/10/2016   Bipolar I disorder with depression (HCC) 01/27/2023   BPH (benign prostatic hyperplasia) 04/19/2018   Carotid artery stenosis 2022   Mild   Cerumen impaction 07/22/2022   CHF (congestive heart failure) (HCC)    Chronic diastolic CHF (congestive heart failure) (HCC) 04/19/2018   Closed nondisplaced fracture of distal phalanx of left thumb 09/24/2023   Contusion of face 09/24/2023   Cubital tunnel syndrome    DDD (degenerative disc disease), cervical    Depression    Elevated PSA    denies per patient   Elevated serum glucose 08/14/2023   Esophageal dysphagia 03/20/2023   Fall on same level from tripping 09/24/2023  Healthcare maintenance 07/18/2023   History of cardioembolic cerebrovascular accident (CVA)    HTN (hypertension)    Hyperlipidemia    Hyperplasia of prostate with lower urinary tract symptoms (LUTS)    Hypertensive heart disease with chronic diastolic congestive heart failure (HCC) 04/02/2021   Insomnia due to medical condition 09/24/2023   Mild dilation of ascending aorta (HCC)    Mixed hyperlipidemia 09/23/2016   Overview:   2018: dx/rx MIS     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   Parkinson disease (HCC)    Parkinson's disease without dyskinesia, with fluctuating manifestations (HCC) 03/20/2023   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/2020   PFO (patent foramen ovale)    Prolapsed internal hemorrhoids, grade 3 04/19/2015   Rectal prolapse    S/P aortic valve replacement with bioprosthetic valve 05/01/2018   S/p left hip fracture 06/23/2021   Scoliosis of lumbosacral spine    Stroke [facility])    x2   Subdural hematoma (HCC) 09/24/2023   Syncope 07/18/2023   Tobacco use 07/22/2022   Tremor    Vitamin D  deficiency     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   BENTALL PROCEDURE N/A 05/01/2018   Procedure: BIOLOGICAL BENTALL PROCEDURE AORTIC ROOT REPLACEMENT WITH VALSALVA GRAFT & INSPIRIS VALVE.;  Surgeon: Dusty Sudie DEL, MD;  Location: [facility] OR;  Service: Open Heart Surgery;  Laterality: N/A;   BUBBLE STUDY  04/13/2021   Procedure: BUBBLE STUDY;  Surgeon: Lonni Slain, MD;  Location: [facility] ENDOSCOPY;  Service: Cardiovascular;;   CATARACT EXTRACTION Bilateral 04/2018   COLONOSCOPY  2007   NASAL SINUS SURGERY  2006   RIGHT/LEFT HEART CATH AND CORONARY ANGIOGRAPHY N/A 04/27/2018   Procedure: RIGHT/LEFT HEART CATH AND CORONARY ANGIOGRAPHY;  Surgeon: Cherrie Toribio SAUNDERS, MD;  Location: [facility] INVASIVE CV LAB;  Service: Cardiovascular;  Laterality: N/A;   TEE WITHOUT CARDIOVERSION N/A 04/27/2018   Procedure: TRANSESOPHAGEAL ECHOCARDIOGRAM (TEE);  Surgeon: Cherrie Toribio SAUNDERS, MD;  Location: [facility] ENDOSCOPY;  Service: Cardiovascular;  Laterality: N/A;   TEE WITHOUT CARDIOVERSION N/A 05/01/2018   Procedure: TRANSESOPHAGEAL ECHOCARDIOGRAM (TEE);  Surgeon: Dusty Sudie DEL, MD;  Location: [facility] OR;  Service: Open Heart Surgery;  Laterality: N/A;   TEE WITHOUT CARDIOVERSION N/A 04/13/2021   Procedure: TRANSESOPHAGEAL ECHOCARDIOGRAM  (TEE);  Surgeon: Lonni Slain, MD;  Location: [facility] ENDOSCOPY;  Service: Cardiovascular;  Laterality: N/A;   TRANSANAL HEMORRHOIDAL DEARTERIALIZATION N/A 11/15/2015   Procedure: TRANSANAL HEMORRHOIDAL DEARTERIALIZATION;  Surgeon: Bernarda Ned, MD;  Location: [facility];  Service: General;  Laterality: N/A;    Family History  Problem Relation Age of Onset   COPD Mother    Heart attack Mother    Colon polyps Father    Prostate cancer Father    Parkinson's disease Father    Hyperlipidemia Father    Depression Sister    Alcohol abuse Sister    Hypertension Sister    Breast cancer Sister    Hypertension Brother    Prostate cancer Paternal Uncle     Social History   Socioeconomic History   Marital status: Single    Spouse name: Not on file   Number of children: 2   Years of education: Not on file   Highest education level: 12th grade  Occupational History   Occupation: retired    Comment: security guard  Tobacco Use   Smoking status: Every Day  Current packs/day: 0.15    Average packs/day: 0.2 packs/day for 50.0 years (7.5 ttl pk-yrs)    Types: Cigarettes   Smokeless tobacco: Never   Tobacco comments:    Patient has cut down to three cigarettes a day  Vaping Use   Vaping status: Former  Substance and Sexual Activity   Alcohol use: Not Currently    Comment: Occasional   Drug use: No   Sexual activity: Not Currently  Other Topics Concern   Not on file  Social History Narrative   1 son, 1 daughter   Agricultural consultant work   3 caffeine/day   Right handed   Social Drivers of Health   Financial Resource Strain: Low Risk  (04/28/2024)   Overall Financial Resource Strain (CARDIA)    Difficulty of Paying Living Expenses: Not hard at all  Food Insecurity: Unknown (04/28/2024)   Hunger Vital Sign    Worried About Running Out of Food in the Last Year: Patient declined    Ran Out of Food in the Last Year: Never true  Transportation Needs: No Transportation  Needs (04/28/2024)   PRAPARE - Administrator, Civil Service (Medical): No    Lack of Transportation (Non-Medical): No  Physical Activity: Insufficiently Active (04/28/2024)   Exercise Vital Sign    Days of Exercise per Week: 2 days    Minutes of Exercise per Session: 20 min  Stress: Stress Concern Present (04/28/2024)   [employer] of Occupational Health - Occupational Stress Questionnaire    Feeling of Stress: Rather much  Social Connections: Moderately Isolated (04/28/2024)   Social Connection and Isolation Panel    Frequency of Communication with Friends and Family: Twice a week    Frequency of Social Gatherings with Friends and Family: Once a week    Attends Religious Services: Patient declined    Database administrator or Organizations: No    Attends Engineer, structural: Not on file    Marital Status: Living with partner  Intimate Partner Violence: Not At Risk (08/05/2023)   Humiliation, Afraid, Rape, and Kick questionnaire    Fear of Current or Ex-Partner: No    Emotionally Abused: No    Physically Abused: No    Sexually Abused: No    Outpatient Medications Prior to Visit  Medication Sig Dispense Refill   albuterol  (VENTOLIN  HFA) 108 (90 Base) MCG/ACT inhaler USE 2 INHALATIONS BY MOUTH EVERY 6 HOURS AS NEEDED FOR WHEEZING  OR SHORTNESS OF BREATH 34 g 2   amantadine  (SYMMETREL ) 100 MG capsule TAKE 1 CAPSULE BY MOUTH AT  1 PM (Patient taking differently: Take 100 mg by mouth daily. TAKE 1 CAPSULE BY MOUTH AT  1 PM) 90 capsule 0   aspirin  EC 81 MG tablet Take 81 mg by mouth daily. Swallow whole.     atorvastatin  (LIPITOR) 20 MG tablet TAKE 1 TABLET BY MOUTH ONCE  DAILY 100 tablet 2   buPROPion  (WELLBUTRIN  XL) 150 MG 24 hr tablet TAKE 1 TABLET BY MOUTH DAILY 90 tablet 3   carbidopa -levodopa  (SINEMET  CR) 50-200 MG tablet TAKE 1 TABLET BY MOUTH EVERY  NIGHT AT BEDTIME 90 tablet 3   carbidopa -levodopa  (SINEMET  IR) 25-100 MG tablet TAKE 2 TABLETS BY MOUTH 3  TIMES  DAILY 540 tablet 0   carvedilol  (COREG ) 3.125 MG tablet Take 1 tablet (3.125 mg total) by mouth 2 (two) times daily. 180 tablet 3   celecoxib  (CELEBREX ) 200 MG capsule TAKE 1 CAPSULE BY MOUTH DAILY 100 capsule 2  cholecalciferol (VITAMIN D3) 25 MCG (1000 UNIT) tablet Take 2,000 Units by mouth daily.     fluticasone  (FLONASE ) 50 MCG/ACT nasal spray Place 1 spray into both nostrils 3 times/day as needed-between meals & bedtime. (Patient taking differently: Place 1 spray into both nostrils 3 times/day as needed-between meals & bedtime for allergies or rhinitis.) 48 g 3   furosemide  (LASIX ) 20 MG tablet Take 1 tablet (20 mg total) by mouth daily. 30 tablet 3   KRILL OIL PO Take 1 tablet by mouth daily.     montelukast  (SINGULAIR ) 10 MG tablet TAKE 1 TABLET BY MOUTH AT  BEDTIME 100 tablet 2   polyethylene glycol powder (GLYCOLAX /MIRALAX ) 17 GM/SCOOP powder Take 0.5 Containers by mouth as needed for mild constipation or moderate constipation.     [START ON 05/13/2024] sertraline  (ZOLOFT ) 100 MG tablet Take 2 tablets (200 mg total) by mouth at bedtime. 180 tablet 0   tamsulosin  (FLOMAX ) 0.4 MG CAPS capsule TAKE 2 CAPSULES BY MOUTH DAILY  AFTER SUPPER TAKE 2 CAPSULES BY  MOUTH DAILY AFTER SUPPER 200 capsule 2   triamcinolone  (NASACORT ) 55 MCG/ACT AERO nasal inhaler Place 2 sprays into the nose daily. 1 each 12   Zinc 50 MG TABS Take 50 mg by mouth at bedtime.     HYDROcodone -acetaminophen  (NORCO/VICODIN) 5-325 MG tablet Take 1 tablet by mouth every 8 (eight) hours as needed for moderate pain (pain score 4-6) or severe pain (pain score 7-10).     ondansetron  (ZOFRAN ) 4 MG tablet Take 1 tablet (4 mg total) by mouth every 8 (eight) hours as needed for nausea or vomiting. 20 tablet 0   No facility-administered medications prior to visit.    No Known Allergies  Review of Systems  Constitutional:  Negative for chills, fatigue and fever.  HENT:  Negative for congestion, ear pain and sore throat.    Respiratory:  Negative for cough and shortness of breath.   Cardiovascular:  Negative for chest pain.  Gastrointestinal:  Negative for abdominal pain, constipation, diarrhea, nausea and vomiting.  Endocrine: Negative for polydipsia, polyphagia and polyuria.  Genitourinary:  Negative for dysuria and frequency.  Musculoskeletal:  Negative for arthralgias and myalgias.  Neurological:  Negative for dizziness and headaches.  Psychiatric/Behavioral:  Negative for dysphoric mood.        No dysphoria       Objective:        04/28/2024    1:57 PM 03/09/2024    3:41 PM 01/22/2024    3:15 PM  Vitals with BMI  Height 6' 1 6' 0 6' 0  Weight 170 lbs 166 lbs 165 lbs 3 oz  BMI 22.43 22.51 22.4  Systolic 130 130 867  Diastolic 80 88 90  Pulse 70 73 82    No data found.   Physical Exam Vitals reviewed.  Constitutional:      Appearance: Normal appearance.     Comments: frail  Neck:     Vascular: No carotid bruit.  Cardiovascular:     Rate and Rhythm: Normal rate and regular rhythm.     Pulses: Normal pulses.     Heart sounds: Normal heart sounds.  Pulmonary:     Effort: Pulmonary effort is normal.     Breath sounds: Normal breath sounds. No wheezing, rhonchi or rales.  Abdominal:     General: Bowel sounds are normal.     Palpations: Abdomen is soft.     Tenderness: There is no abdominal tenderness.  Neurological:  Mental Status: He is alert.     Gait: Gait abnormal.  Psychiatric:        Mood and Affect: Mood normal.        Behavior: Behavior normal.           Health Maintenance Due  Topic Date Due   DTaP/Tdap/Td (1 - Tdap) Never done   COVID-19 Vaccine (7 - 2024-25 season) 05/03/2024    There are no preventive care reminders to display for this patient.   Lab Results  Component Value Date   TSH 1.680 04/28/2024   Lab Results  Component Value Date   WBC 8.4 04/28/2024   HGB 13.8 04/28/2024   HCT 41.0 04/28/2024   MCV 104 (H) 04/28/2024   PLT 211  04/28/2024   Lab Results  Component Value Date   NA 142 04/28/2024   K 4.4 04/28/2024   CO2 20 04/28/2024   GLUCOSE 92 04/28/2024   BUN 17 04/28/2024   CREATININE 0.83 04/28/2024   BILITOT 0.6 04/28/2024   ALKPHOS 98 04/28/2024   AST 20 04/28/2024   ALT 11 04/28/2024   PROT 7.1 04/28/2024   ALBUMIN  4.3 04/28/2024   CALCIUM  9.4 04/28/2024   ANIONGAP 11 01/22/2021   EGFR 91 04/28/2024   Lab Results  Component Value Date   CHOL 106 07/18/2023   Lab Results  Component Value Date   HDL 54 07/18/2023   Lab Results  Component Value Date   LDLCALC 36 07/18/2023   Lab Results  Component Value Date   TRIG 76 07/18/2023   Lab Results  Component Value Date   CHOLHDL 2.0 07/18/2023   Lab Results  Component Value Date   HGBA1C 5.6 08/13/2023       Assessment & Plan:  Parkinson's disease without dyskinesia, with fluctuating manifestations (HCC) Assessment & Plan: Parkinson's disease managed with amantadine  and Sinemet . Symptoms include chronic leg pain and short-term memory issues. Family history of Parkinson's disease. - Continue current Parkinson's medications.   Hypertensive heart disease with chronic diastolic congestive heart failure (HCC) Assessment & Plan: Well controlled.  No changes to medicines. Continue Coreg  3.125 mg TWICE DAILY and Lasix  20 mg daily. Continue to work on eating a healthy diet and exercise.  Labs drawn today.    Orders: -     Comprehensive metabolic panel with GFR -     CBC with Differential/Platelet -     TSH  Pressure injury of sacral region, stage 2 (HCC) Assessment & Plan: Chronic wounds on left index finger, left elbow, and sacral area. Possible allergic reaction to Neosporin. Smoking may impair healing. - Prescribed antibiotic ointment for left index finger and elbow. - Recommended sacral cushion to relieve pressure. - Referred to wound care center. - Advised discontinuation of Neosporin. - Encouraged smoking  cessation.  Orders: -     AMB referral to wound care center  Open wound of index finger Assessment & Plan: Chronic wounds on left index finger, left elbow, and sacral area. Possible allergic reaction to Neosporin. Smoking may impair healing. - Prescribed antibiotic ointment for left index finger and elbow. - Recommended sacral cushion to relieve pressure. - Referred to wound care center. - Advised discontinuation of Neosporin. - Encouraged smoking cessation.  Orders: -     Mupirocin ; Apply 1 Application topically 2 (two) times daily.  Dispense: 30 g; Refill: 0 -     AMB referral to wound care center  Mixed hyperlipidemia Assessment & Plan: Well controlled.  No changes to medicines.  Continue atorvastatin  20 mg daily and krill oil. Continue to work on eating a healthy diet and exercise.  Labs drawn today.    Orders: -     Lipid panel  Tobacco use Assessment & Plan: Continues to smoke half a pack per day. Previous cessation attempts with Chantix and nicotine  patches were unsuccessful. Encouraged cessation. Patient refused at this time.    Diplopia Assessment & Plan: Nausea with intermittent diplopia under investigation.   Orders: -     Ondansetron  HCl; Take 1 tablet (4 mg total) by mouth every 8 (eight) hours as needed for nausea or vomiting.  Dispense: 60 tablet; Refill: 1  Nausea Assessment & Plan: Nausea with intermittent diplopia under investigation.  Managed nausea with medication. - Prescribed additional nausea medication.    Meds ordered this encounter  Medications   mupirocin  ointment (BACTROBAN ) 2 %    Sig: Apply 1 Application topically 2 (two) times daily.    Dispense:  30 g    Refill:  0   ondansetron  (ZOFRAN ) 4 MG tablet    Sig: Take 1 tablet (4 mg total) by mouth every 8 (eight) hours as needed for nausea or vomiting.    Dispense:  60 tablet    Refill:  1    Orders Placed This Encounter  Procedures   Comprehensive metabolic panel with GFR    CBC with Differential/Platelet   TSH   Lipid panel   AMB referral to wound care center     Follow-up: Return in about 3 months (around 07/29/2024) for chronic follow up.  An After Visit Summary was printed and given to the patient.   LILLETTE Kato I Leal-Borjas,acting as a scribe for Abigail Free, MD.,have documented all relevant documentation on the behalf of Abigail Free, MD,as directed by  Abigail Free, MD while in the presence of Abigail Free, MD.   I attest that I have reviewed this visit and agree with the plan scribed by my staff.   Abigail Free, MD [facility] [phone]

## 2024-04-28 NOTE — Assessment & Plan Note (Signed)
 Nausea with intermittent diplopia under investigation.

## 2024-04-29 ENCOUNTER — Ambulatory Visit: Payer: Self-pay | Admitting: Family Medicine

## 2024-04-29 LAB — COMPREHENSIVE METABOLIC PANEL WITH GFR
ALT: 11 IU/L (ref 0–44)
AST: 20 IU/L (ref 0–40)
Albumin: 4.3 g/dL (ref 3.8–4.8)
Alkaline Phosphatase: 98 IU/L (ref 44–121)
BUN/Creatinine Ratio: 20 (ref 10–24)
BUN: 17 mg/dL (ref 8–27)
Bilirubin Total: 0.6 mg/dL (ref 0.0–1.2)
CO2: 20 mmol/L (ref 20–29)
Calcium: 9.4 mg/dL (ref 8.6–10.2)
Chloride: 104 mmol/L (ref 96–106)
Creatinine, Ser: 0.83 mg/dL (ref 0.76–1.27)
Globulin, Total: 2.8 g/dL (ref 1.5–4.5)
Glucose: 92 mg/dL (ref 70–99)
Potassium: 4.4 mmol/L (ref 3.5–5.2)
Sodium: 142 mmol/L (ref 134–144)
Total Protein: 7.1 g/dL (ref 6.0–8.5)
eGFR: 91 mL/min/1.73 (ref >59)

## 2024-04-29 LAB — CBC WITH DIFFERENTIAL/PLATELET
Basophils Absolute: 0 x10E3/uL (ref 0.0–0.2)
Basos: 0 %
EOS (ABSOLUTE): 0.1 x10E3/uL (ref 0.0–0.4)
Eos: 1 %
Hematocrit: 41 % (ref 37.5–51.0)
Hemoglobin: 13.8 g/dL (ref 13.0–17.7)
Immature Grans (Abs): 0 x10E3/uL (ref 0.0–0.1)
Immature Granulocytes: 0 %
Lymphocytes Absolute: 1.8 x10E3/uL (ref 0.7–3.1)
Lymphs: 22 %
MCH: 35.1 pg — ABNORMAL HIGH (ref 26.6–33.0)
MCHC: 33.7 g/dL (ref 31.5–35.7)
MCV: 104 fL — ABNORMAL HIGH (ref 79–97)
Monocytes Absolute: 0.8 x10E3/uL (ref 0.1–0.9)
Monocytes: 10 %
Neutrophils Absolute: 5.6 x10E3/uL (ref 1.4–7.0)
Neutrophils: 67 %
Platelets: 211 x10E3/uL (ref 150–450)
RBC: 3.93 x10E6/uL — ABNORMAL LOW (ref 4.14–5.80)
RDW: 12.2 % (ref 11.6–15.4)
WBC: 8.4 x10E3/uL (ref 3.4–10.8)

## 2024-04-29 LAB — LIPID PANEL
Chol/HDL Ratio: 2.2 ratio (ref 0.0–5.0)
Cholesterol, Total: 125 mg/dL (ref 100–199)
HDL: 57 mg/dL (ref >39)
LDL Chol Calc (NIH): 51 mg/dL (ref 0–99)
Triglycerides: 87 mg/dL (ref 0–149)
VLDL Cholesterol Cal: 17 mg/dL (ref 5–40)

## 2024-04-29 LAB — TSH: TSH: 1.68 u[IU]/mL (ref 0.450–4.500)

## 2024-05-06 DIAGNOSIS — M5416 Radiculopathy, lumbar region: Secondary | ICD-10-CM | POA: Diagnosis not present

## 2024-05-06 DIAGNOSIS — M48062 Spinal stenosis, lumbar region with neurogenic claudication: Secondary | ICD-10-CM | POA: Diagnosis not present

## 2024-05-06 NOTE — Progress Notes (Signed)
 HPI: 78 yo male presents for follow-up today.  He states he has had increasing bilateral lower extremity radiculopathy.  He states his low back pain has been pretty stable but the leg pain has gotten significantly worse over the last few months.  And his right leg goes all the way into his foot.  In his left leg goes down the lateral aspect of his leg to his knee.  He does get some associated paresthesias.  He does have a history of Parkinson's disease and spoke with his neurologist who did not feel that this was related to his Parkinson's disease.  He also shares today that he had a fall in January and had to go to the hospital and was diagnosed with a subdural hematoma so has been recovering from that over the last several months.  He states that really did not increase his low back pain at the time.  He states the leg pain is worse with walking and gets better when sitting.  He has responded well to injections in the past.  He also shares today that his left total hip replacement feels different and he feels like that it may be loose.Pain is 9/10 at worst.   Review of Systems  Constitutional:  Negative for fever and weight loss.  HENT:  Negative for congestion, hearing loss and sore throat.   Eyes:  Positive for blurred vision.  Respiratory:  Negative for cough and shortness of breath.   Cardiovascular:  Negative for chest pain and palpitations.  Gastrointestinal:  Negative for abdominal pain, nausea and vomiting.  Genitourinary:  Negative for dysuria.  Skin:  Negative for rash.  Neurological:  Positive for dizziness. Negative for tremors, speech change and loss of consciousness.  Endo/Heme/Allergies:  Does not bruise/bleed easily.  Psychiatric/Behavioral:  Negative for hallucinations and memory loss.      PMH: Multiple falls: dx with Parkinson's disease and is now on carbidopa /levodopa . He fell in Sept 2022 with subsequent non-displaced hip fracture and THA. TIA with no residual effects and no  blood thinners started.  Imaging:  XR left hip taken 01-27-2024 and reviewed with the patient showing his left hip hemiarthroplasty.  No obvious hardware loosening.  Cervical XR showing advanced multilevel disc and facet degeneration. there is an anterolisthesis at C4 on C5. diffuse uncovertebral spurring noted. No acute fractures noted. Cervical MRI 12/2020 on [facility].  Scoli XR 11/29/20 showing degenerative scoliosis curvature measuring 27 deg L2-S1 apex to the left. vertebral bodies with wedge like appearance due to degenerative disease. Truncal shift to the right, decompensated sagittally.   Lumbar MRI on [facility] 11/2022: Lumbar spondylosis and degenerative disc disease, causing prominent impingement at L4-5; moderate impingement at L1-2, L2-3, and L5-S1; and mild impingement at T12-L1 and L3-4, as noted above. The left foraminal impingement at L5-S1 is minimally worsened compared to the 12/15/2020 exam, and the right facet joint effusion previously shown at L4-5 is no longer present. Otherwise the appearance is relatively stable.Substantial lumbar scoliosis with rotary component.   Lumbar MRI 02/25/24 on [facility]: Severe lumbar spondylosis causes severe L4-L5 spinal canal stenosis with crowding of the descending nerve roots. Large R-sided facet cyst at this level. Moderate-to-severe L1-L2 and moderate L2-L3 spinal canal and right greater than left lateral recess stenosis.Degenerative changes and levoscoliosis cause multilevel neural foraminal stenosis, most notable and severe on the right at L2-L3, bilaterally at L4-L5, and left at L5-S1.  Physical exam: Patient is alert and oriented x3. Answers questions appropriately. Does not appear ill or  toxic. BUE 5/5 strength throughout. BLE pulses are palpable, skin is warm and dry, no erythema, or edema is noted. BLE strength is 5/5 in bilateral EHL, DF, PF, quads, hamstrings, HF, HE, abductors, and adductors. Sensation is symmetric bilaterally. Negative  straight leg raise. Negative Belvie Bellman. Patient walks with a narrow-based gait, no instability on exam. No pain secondary to flexion/extension. Patellar and achilles reflexes 2+ and symmetric bilaterally.   Injections: R L5 SNRB 02/15/2020 with significant relief x 2 months before gradual return.  Repeat R L5 SNRB 04/03/2020 without diagnostic or therapeutic relief.  B L4 tfesi 09/14/2019 with significant but unsustained relief.  B S1 tfesi on 10/12/2019 and 4/13 with about 80% relief x 2 weeks and partial return.  B L5 tfesi 07/12/20 with 80% sustained relief.  B/L L5 TFESI on 11/01/20 w/ good relief. B L5 TFESI 08/2021  L L5 SNRB and L S1 SNRB 11/28/22 was diagnostic. >90% improvement x 2 weeks then with gradual return. He is able to use a cane today and is no longer in a wheelchair since the injection. Pain is now intermittent where it was constant before. Taking lyrica 25 mg BID with some benefit and denies SEs. L L5 snrb and L S1 snrb with partial sustained improvement, 50%.  L L2345 MBB was positive with >80% relief x2.  L L2345 MB RFA 05/27/23 with >50% sustained relief.  Recommendations: Patient was educated on diagnosis and typical treatment algorithm. Questions were encouraged and answered. Patient was instructed to call with any new concerns or problems. Continue home exercise program. Continue current medications.  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  He has a right sided large facet cyst at L4-5 as well.  Bilateral L5 SNRBs 03/2024 with 90% relief of his symptoms for several weeks and still reports 70% sustained relief at this time.  He does have axial back pain and symptoms into either leg in the morning however it is fairly manageable most of the time.  He does have some bad days will take an occasional hydrocodone .  Hydrocodone  refilled today 30 pills every 8 hours as needed.  He will follow-up in 2 months and we will plan for UBT at that time.  PDMP checked and  is appropriate  He is hopeful to avoid surgery.  We had discussed in the past potentially looking at an L3-S1 Lami/PSF/TLIF but given his history of Parkinson's disease he really did not want undergo that.  Might consider a smaller surgery if neurogenic claudication worsens.  He will follows with his neurologist regarding the dizziness he has been experiencing.  He has had a pretty extensive workup for this and he states they cannot figure out what is causing his dizziness.  Total time spent with patient was greater than 30 minutes at today's visit, including history and physical exam, ordering medications, review of previous OV notes and imaging, review of advanced imaging. assessment, plan of care, and documentation.
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
## 2024-05-19 ENCOUNTER — Telehealth: Admitting: Psychiatry

## 2024-05-19 ENCOUNTER — Encounter: Payer: Self-pay | Admitting: Psychiatry

## 2024-05-19 DIAGNOSIS — F3341 Major depressive disorder, recurrent, in partial remission: Secondary | ICD-10-CM

## 2024-05-19 DIAGNOSIS — G4700 Insomnia, unspecified: Secondary | ICD-10-CM

## 2024-05-19 DIAGNOSIS — F419 Anxiety disorder, unspecified: Secondary | ICD-10-CM

## 2024-05-19 NOTE — Patient Instructions (Signed)
 Continue sertraline  200 mg at night Continue bupropion  150 mg daily  Next appointment: 11/7 at 11 am

## 2024-05-27 ENCOUNTER — Ambulatory Visit: Admitting: Psychology

## 2024-05-27 DIAGNOSIS — F4323 Adjustment disorder with mixed anxiety and depressed mood: Secondary | ICD-10-CM

## 2024-06-25 ENCOUNTER — Ambulatory Visit: Admitting: Psychology

## 2024-06-25 DIAGNOSIS — F4323 Adjustment disorder with mixed anxiety and depressed mood: Secondary | ICD-10-CM | POA: Diagnosis not present

## 2024-06-25 NOTE — Progress Notes (Signed)
 [facility] Counselor/Therapist Progress Note  Patient ID: Travis Reese, MRN: [id number],    Date: 06/25/2024  Time Spent: 3:00pm-3:55pm   55 minutes   Treatment Type: Individual Therapy  Reported Symptoms: stress, anxiety  Mental Status Exam: Appearance:  Casual     Behavior: Appropriate  Motor: Normal  Speech/Language:  Normal Rate  Affect: Appropriate  Mood: normal  Thought process: normal  Thought content:   WNL  Sensory/Perceptual disturbances:   WNL  Orientation: oriented to person, place, time/date, and situation  Attention: Good  Concentration: Good  Memory: WNL  Fund of knowledge:  Good  Insight:   Good  Judgment:  Good  Impulse Control: Good   Risk Assessment: Danger to Self:  No Self-injurious Behavior: No Danger to Others: No Duty to Warn:no Physical Aggression / Violence:No  Access to Firearms a concern: No  Gang Involvement:No   Subjective: Pt present for face-to-face individual therapy via video.  Pt consents to telehealth video session and is aware of limitations and benefits of virtual sessions. Location of pt: home Location of therapist: home office.   Pt talked about his health.  He states he has not been doing well.  He has had double vision and the doctors don't know why.  Pt has also had nausea.   Pt has had increased pain in his legs and hips.   The back injections he had a couple of months ago did not create relief for very long.   Pt states he now may need back surgery.   Pt meets with his doctor in a couple of weeks to talk about the surgery option.   Pt states he is having problems with his memory and thinks it's related to his parkinsons.  Worked with pt on accommodation strategies.  Pt states he has felt really discouraged bc of his health issues.   Worked on coping strategies. Pt talked about family issues.  His partner Santana has been working on now being as critical to pt.    Pt states he had a talk with her about the way  she criticizes him.  It helped her to change her behavior for a little while but then she gets critical again.   Santana has chronic pain and takes it out on pt at times.  Pt states he will keep speaking up for himself and letting her know when what she says bothers him.   Pt is proud of himself for taking the risk to speak up.   Helped pt process relationship dynamics.   Pt talked about the holidays.   He will be with extended family for Thanksgiving and with his daughter for Christmas.   Worked on self care strategies.  Provided supportive therapy.      Interventions: Cognitive Behavioral Therapy and Insight-Oriented  Diagnosis:  F43.23  Plan of Care: Recommend ongoing therapy.  Pt participated in setting treatment goals.   He would like to improve his sleep and feel less anxious and depressed.   Plan to meet every two weeks.   Pt agrees with treatment plan.    Treatment Plan Client Abilities/Strengths  Pt is engaging and motivated for therapy.  Client Treatment Preferences  Individual therapy.  Client Statement of Needs  Improve copings skills and improve sleep. Symptoms  Depressed or irritable mood. Excessive and/or unrealistic worry that is difficult to control occurring more days than not for at least 6 months about a number of events or activities. Hypervigilance (e.g., feeling constantly on edge,  experiencing concentration difficulties, having trouble falling or staying asleep, exhibiting a general state of irritability). Low self-esteem. Problems Addressed  Unipolar Depression, Anxiety Goals 1. Alleviate depressive symptoms and return to previous level of effective functioning. 2. Appropriately grieve the loss in order to normalize mood and to return to previously adaptive level of functioning. Objective Learn and implement behavioral strategies to overcome depression. Target Date: 2024-11-26 Frequency: Biweekly  Progress: 10 Modality: individual  Related Interventions Assist  the client in developing skills that increase the likelihood of deriving pleasure from behavioral activation (e.g., assertiveness skills, developing an exercise plan, less internal/more external focus, increased social involvement); reinforce success. Engage the client in behavioral activation, increasing his/her activity level and contact with sources of reward, while identifying processes that inhibit activation. use behavioral techniques such as instruction, rehearsal, role-playing, role reversal, as needed, to facilitate activity in the client's daily life; reinforce success. 3. Develop healthy interpersonal relationships that lead to the alleviation and help prevent the relapse of depression. 4. Develop healthy thinking patterns and beliefs about self, others, and the world that lead to the alleviation and help prevent the relapse of depression. 5. Enhance ability to effectively cope with the full variety of life's worries and anxieties. 6. Learn and implement coping skills that result in a reduction of anxiety and worry, and improved daily functioning. Objective Learn and implement problem-solving strategies for realistically addressing worries. Target Date: 2024-11-26 Frequency: Biweekly  Progress: 10 Modality: individual  Related Interventions Assign the client a homework exercise in which he/she problem-solves a current problem (see Mastery of Your Anxiety and Worry: Workbook by Richarda and Jonne or Generalized Anxiety Disorder by Delores Filler, and Jonne); review, reinforce success, and provide corrective feedback toward improvement. Teach the client problem-solving strategies involving specifically defining a problem, generating options for addressing it, evaluating the pros and cons of each option, selecting and implementing an optional action, and reevaluating and refining the action. Objective Learn and implement calming skills to reduce overall anxiety and manage anxiety  symptoms. Target Date: 2024-11-26 Frequency: Biweekly  Progress: 10 Modality: individual  Related Interventions Assign the client to read about progressive muscle relaxation and other calming strategies in relevant books or treatment manuals (e.g., Progressive Relaxation Training by Thornell armin Collier; Mastery of Your Anxiety and Worry: Workbook by Richarda armin Jonne). Assign the client homework each session in which he/she practices relaxation exercises daily, gradually applying them progressively from non-anxiety-provoking to anxiety-provoking situations; review and reinforce success while providing corrective feedback toward improvement. Teach the client calming/relaxation skills (e.g., applied relaxation, progressive muscle relaxation, cue controlled relaxation; mindful breathing; biofeedback) and how to discriminate better between relaxation and tension; teach the client how to apply these skills to his/her daily life. 7. Recognize, accept, and cope with feelings of depression. 8. Reduce overall frequency, intensity, and duration of the anxiety so that daily functioning is not impaired. 9. Resolve the core conflict that is the source of anxiety. 10. Stabilize anxiety level while increasing ability to function on a daily basis. Diagnosis F43.23 Conditions For Discharge Achievement of treatment goals and objectives   Veva Alma, LCSW

## 2024-06-30 ENCOUNTER — Other Ambulatory Visit: Payer: Self-pay | Admitting: Psychiatry

## 2024-07-01 NOTE — Progress Notes (Signed)
 HPI: 78 yo male presents for follow-up today.  He states he has had increasing bilateral lower extremity radiculopathy.  He states his low back pain has been pretty stable but the leg pain has gotten significantly worse over the last few months.  And his right leg goes all the way into his foot.  In his left leg goes down the lateral aspect of his leg to his knee.  He does get some associated paresthesias.  He does have a history of Parkinson's disease and spoke with his neurologist who did not feel that this was related to his Parkinson's disease.  He also shares today that he had a fall in January and had to go to the hospital and was diagnosed with a subdural hematoma so has been recovering from that over the last several months.  He states that really did not increase his low back pain at the time.  He states the leg pain is worse with walking and gets better when sitting.  He has responded well to injections in the past.  He also shares today that his left total hip replacement feels different and he feels like that it may be loose.Pain is 9/10 at worst.   Injections: R L5 SNRB 02/15/2020 with significant relief x 2 months before gradual return.  Repeat R L5 SNRB 04/03/2020 without diagnostic or therapeutic relief.  B L4 tfesi 09/14/2019 with significant but unsustained relief.  B S1 tfesi on 10/12/2019 and 4/13 with about 80% relief x 2 weeks and partial return.  B L5 tfesi 07/12/20 with 80% sustained relief.  B/L L5 TFESI on 11/01/20 w/ good relief. B L5 TFESI 08/2021  L L5 SNRB and L S1 SNRB 11/28/22 was diagnostic. >90% improvement x 2 weeks then with gradual return. He is able to use a cane today and is no longer in a wheelchair since the injection. Pain is now intermittent where it was constant before. Taking lyrica 25 mg BID with some benefit and denies SEs. L L5 snrb and L S1 snrb with partial sustained improvement, 50%.  L L2345 MBB was positive with >80% relief x2.  L L2345 MB RFA 05/27/23 with  >50% sustained relief.  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  He has a right sided large facet cyst at L4-5 as well.   Bilateral L5 SNRBs 03/2024 with 90% relief of his symptoms but he reports really only for several weeks before it was really returning back to preinjection levels.  We started him on a low-dose hydrocodone  and he has been using it very sparingly over the last several weeks.  When he does use the medication he finds that it does help her symptoms quite a bit.  Does not really have any significant side effects from it.  Review of Systems  Constitutional:  Negative for fever and weight loss.  HENT:  Negative for congestion, hearing loss and sore throat.   Eyes:  Positive for blurred vision.  Respiratory:  Negative for cough and shortness of breath.   Cardiovascular:  Negative for chest pain and palpitations.  Gastrointestinal:  Negative for abdominal pain, nausea and vomiting.  Genitourinary:  Negative for dysuria.  Skin:  Negative for rash.  Neurological:  Positive for dizziness. Negative for tremors, speech change and loss of consciousness.  Endo/Heme/Allergies:  Does not bruise/bleed easily.  Psychiatric/Behavioral:  Negative for hallucinations and memory loss.      PMH: Multiple falls: dx with Parkinson's disease and is now on  carbidopa /levodopa . He fell in Sept 2022 with subsequent non-displaced hip fracture and THA. TIA with no residual effects and no blood thinners started.  Imaging:  XR left hip taken 01-27-2024 and reviewed with the patient showing his left hip hemiarthroplasty.  No obvious hardware loosening.  Cervical XR showing advanced multilevel disc and facet degeneration. there is an anterolisthesis at C4 on C5. diffuse uncovertebral spurring noted. No acute fractures noted. Cervical MRI 12/2020 on [facility].  Scoli XR 11/29/20 showing degenerative scoliosis curvature measuring 27 deg L2-S1 apex to the left. vertebral bodies with wedge  like appearance due to degenerative disease. Truncal shift to the right, decompensated sagittally.   Lumbar MRI on [facility] 11/2022: Lumbar spondylosis and degenerative disc disease, causing prominent impingement at L4-5; moderate impingement at L1-2, L2-3, and L5-S1; and mild impingement at T12-L1 and L3-4, as noted above. The left foraminal impingement at L5-S1 is minimally worsened compared to the 12/15/2020 exam, and the right facet joint effusion previously shown at L4-5 is no longer present. Otherwise the appearance is relatively stable.Substantial lumbar scoliosis with rotary component.   Lumbar MRI 02/25/24 on [facility]: Severe lumbar spondylosis causes severe L4-L5 spinal canal stenosis with crowding of the descending nerve roots. Large R-sided facet cyst at this level. Moderate-to-severe L1-L2 and moderate L2-L3 spinal canal and right greater than left lateral recess stenosis.Degenerative changes and levoscoliosis cause multilevel neural foraminal stenosis, most notable and severe on the right at L2-L3, bilaterally at L4-L5, and left at L5-S1.  Physical exam: Patient is alert and oriented x3. Answers questions appropriately. Does not appear ill or toxic. BUE 5/5 strength throughout. BLE pulses are palpable, skin is warm and dry, no erythema, or edema is noted. BLE strength is 5/5 in bilateral EHL, DF, PF, quads, hamstrings, HF, HE, abductors, and adductors. Sensation is symmetric bilaterally. Negative straight leg raise. Negative Belvie Bellman. Patient walks with a narrow-based gait, no instability on exam. No pain secondary to flexion/extension. Patellar and achilles reflexes 2+ and symmetric bilaterally.  Moderate limited range of motion lumbar spine with somewhat diffuse lumbar sacral tenderness.    Recommendations: Patient was educated on diagnosis and typical treatment algorithm. Questions were encouraged and answered. Patient was instructed to call with any new concerns or problems. Continue  home exercise program. Continue current medications.     Unfortunately, the patient really is not having sustained relief from multiple types of treatment.  He said ablations in the past which gave moderate leaf but again he is complaining they really did not seem to last very long and in terms of his leg symptoms and his epidurals also are not providing sustained relief.  He is hopeful to avoid surgery.  We had discussed in the past potentially looking at an L3-S1 Lami/PSF/TLIF but given his history of Parkinson's disease he really did not want undergo that.  Might consider a smaller surgery if neurogenic claudication worsens.  He will follows with his neurologist regarding the dizziness he has been experiencing.  He has had a pretty extensive workup for this and he states they cannot figure out what is causing his dizziness.  Therefore, we discussed with the patient medical management.  He is getting good relief from occasional use of hydrocodone .  I think is reasonable to use hydrocodone  twice daily as needed to help control symptoms and hopefully continue with his ability to perform ADLs.  He understands the risk of constipation with the medication and is encouraged to start Colace twice daily. He was unable to void today  so UDT will be put off until his next follow-up visit.  Total time spent with patient was greater than 30 minutes at today's visit, including history and physical exam, ordering medications, review of previous OV notes and imaging, review of advanced imaging. assessment, plan of care, and documentation.
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
## 2024-07-09 ENCOUNTER — Telehealth (INDEPENDENT_AMBULATORY_CARE_PROVIDER_SITE_OTHER): Admitting: Psychiatry

## 2024-07-09 ENCOUNTER — Encounter: Payer: Self-pay | Admitting: Psychiatry

## 2024-07-09 DIAGNOSIS — F3341 Major depressive disorder, recurrent, in partial remission: Secondary | ICD-10-CM

## 2024-07-09 DIAGNOSIS — G4700 Insomnia, unspecified: Secondary | ICD-10-CM

## 2024-07-09 DIAGNOSIS — F419 Anxiety disorder, unspecified: Secondary | ICD-10-CM | POA: Diagnosis not present

## 2024-07-09 MED ORDER — SERTRALINE HCL 100 MG PO TABS: 200.0000 mg | ORAL_TABLET | Freq: Every day | ORAL | 0 refills | Status: DC

## 2024-07-09 NOTE — Patient Instructions (Signed)
 Continue sertraline  200 mg at night Continue bupropion  150 mg daily  Next appointment: 1/16 at 10 am

## 2024-07-20 ENCOUNTER — Ambulatory Visit (INDEPENDENT_AMBULATORY_CARE_PROVIDER_SITE_OTHER): Admitting: Psychology

## 2024-07-20 DIAGNOSIS — F4323 Adjustment disorder with mixed anxiety and depressed mood: Secondary | ICD-10-CM | POA: Diagnosis not present

## 2024-07-20 NOTE — Progress Notes (Signed)
 [facility] Counselor/Therapist Progress Note  Patient ID: MURICE BARBAR, MRN: [id number],    Date: 07/20/2024  Time Spent: 3:00pm-3:45pm   45 minutes   Treatment Type: Individual Therapy  Reported Symptoms: stress, sadness  Mental Status Exam: Appearance:  Casual     Behavior: Appropriate  Motor: Normal  Speech/Language:  Normal Rate  Affect: Appropriate  Mood: normal  Thought process: normal  Thought content:   WNL  Sensory/Perceptual disturbances:   WNL  Orientation: oriented to person, place, time/date, and situation  Attention: Good  Concentration: Good  Memory: WNL  Fund of knowledge:  Good  Insight:   Good  Judgment:  Good  Impulse Control: Good   Risk Assessment: Danger to Self:  No Self-injurious Behavior: No Danger to Others: No Duty to Warn:no Physical Aggression / Violence:No  Access to Firearms a concern: No  Gang Involvement:No   Subjective: Pt present for face-to-face individual therapy via video.  Pt consents to telehealth video session and is aware of limitations and benefits of virtual sessions. Location of pt: home Location of therapist: home office.   Pt talked about his health.  He states he is not doing well bc he feels dizzy and nauseous most of the time.  He is on medication for it but does not always help.   Pt is going to call his doctor to get an appointment.  Pt talked about the holidays.  He doesn't have any plans yet.  In years past the family would get together for the holidays but currently there is conflict in the family and pt states nobody is forgiving each other.  Addressed how this impacts pt.  He feels sad about the family dynamics. Pt states he is turning into a home body bc it is hard to go places when he is dizzy.  He states he is embarrassed bc his balance is off.   Pt states he has felt really discouraged bc of his health issues.   Worked on coping strategies. Pt talked about family issues.  His partner Santana  has not been feeling well also.  She has a bad back and is in chronic pain.  Neither on of them feel like going places. Worked on self care strategies.  Provided supportive therapy.      Interventions: Cognitive Behavioral Therapy and Insight-Oriented  Diagnosis:  F43.23  Plan of Care: Recommend ongoing therapy.  Pt participated in setting treatment goals.   He would like to improve his sleep and feel less anxious and depressed.   Plan to meet every two weeks.   Pt agrees with treatment plan.    Treatment Plan Client Abilities/Strengths  Pt is engaging and motivated for therapy.  Client Treatment Preferences  Individual therapy.  Client Statement of Needs  Improve copings skills and improve sleep. Symptoms  Depressed or irritable mood. Excessive and/or unrealistic worry that is difficult to control occurring more days than not for at least 6 months about a number of events or activities. Hypervigilance (e.g., feeling constantly on edge, experiencing concentration difficulties, having trouble falling or staying asleep, exhibiting a general state of irritability). Low self-esteem. Problems Addressed  Unipolar Depression, Anxiety Goals 1. Alleviate depressive symptoms and return to previous level of effective functioning. 2. Appropriately grieve the loss in order to normalize mood and to return to previously adaptive level of functioning. Objective Learn and implement behavioral strategies to overcome depression. Target Date: 2024-11-26 Frequency: Biweekly  Progress: 10 Modality: individual  Related Interventions Assist the client  in developing skills that increase the likelihood of deriving pleasure from behavioral activation (e.g., assertiveness skills, developing an exercise plan, less internal/more external focus, increased social involvement); reinforce success. Engage the client in behavioral activation, increasing his/her activity level and contact with sources of reward, while  identifying processes that inhibit activation. use behavioral techniques such as instruction, rehearsal, role-playing, role reversal, as needed, to facilitate activity in the client's daily life; reinforce success. 3. Develop healthy interpersonal relationships that lead to the alleviation and help prevent the relapse of depression. 4. Develop healthy thinking patterns and beliefs about self, others, and the world that lead to the alleviation and help prevent the relapse of depression. 5. Enhance ability to effectively cope with the full variety of life's worries and anxieties. 6. Learn and implement coping skills that result in a reduction of anxiety and worry, and improved daily functioning. Objective Learn and implement problem-solving strategies for realistically addressing worries. Target Date: 2024-11-26 Frequency: Biweekly  Progress: 10 Modality: individual  Related Interventions Assign the client a homework exercise in which he/she problem-solves a current problem (see Mastery of Your Anxiety and Worry: Workbook by Richarda and Jonne or Generalized Anxiety Disorder by Delores Filler, and Jonne); review, reinforce success, and provide corrective feedback toward improvement. Teach the client problem-solving strategies involving specifically defining a problem, generating options for addressing it, evaluating the pros and cons of each option, selecting and implementing an optional action, and reevaluating and refining the action. Objective Learn and implement calming skills to reduce overall anxiety and manage anxiety symptoms. Target Date: 2024-11-26 Frequency: Biweekly  Progress: 10 Modality: individual  Related Interventions Assign the client to read about progressive muscle relaxation and other calming strategies in relevant books or treatment manuals (e.g., Progressive Relaxation Training by Thornell armin Collier; Mastery of Your Anxiety and Worry: Workbook by Richarda armin Jonne). Assign  the client homework each session in which he/she practices relaxation exercises daily, gradually applying them progressively from non-anxiety-provoking to anxiety-provoking situations; review and reinforce success while providing corrective feedback toward improvement. Teach the client calming/relaxation skills (e.g., applied relaxation, progressive muscle relaxation, cue controlled relaxation; mindful breathing; biofeedback) and how to discriminate better between relaxation and tension; teach the client how to apply these skills to his/her daily life. 7. Recognize, accept, and cope with feelings of depression. 8. Reduce overall frequency, intensity, and duration of the anxiety so that daily functioning is not impaired. 9. Resolve the core conflict that is the source of anxiety. 10. Stabilize anxiety level while increasing ability to function on a daily basis. Diagnosis F43.23 Conditions For Discharge Achievement of treatment goals and objectives   Veva Alma, LCSW

## 2024-08-01 NOTE — Progress Notes (Deleted)
 Subjective:  Patient ID: Travis Reese, male    DOB: 02/26/1946  Age: 78 y.o. MRN: [id number]  No chief complaint on file.   HPI: Discussed the use of AI scribe software for clinical note transcription with the patient, who gave verbal consent to proceed.  History of Present Illness        04/28/2024    1:58 PM 12/30/2023    1:35 PM 08/05/2023   11:41 AM 07/18/2023    9:24 AM 06/02/2023    3:37 PM  Depression screen PHQ 2/9  Decreased Interest 0 2 0 0   Down, Depressed, Hopeless 0 2 0 0   PHQ - 2 Score 0 4 0 0   Altered sleeping 0 0 0 0   Tired, decreased energy 0 0 0 0   Change in appetite 0 0 0 2   Feeling bad or failure about yourself  0 1 0 0   Trouble concentrating 0 1 0 0   Moving slowly or fidgety/restless 0 0 0 0   Suicidal thoughts 0 0 0 0   PHQ-9 Score 0  6  0  2    Difficult doing work/chores Not difficult at all  Not difficult at all Not difficult at all      Information is confidential and restricted. Go to Review Flowsheets to unlock data.   Data saved with a previous flowsheet row definition        01/22/2024    3:16 PM  Fall Risk   Falls in the past year? 0  Number falls in past yr: 0  Injury with Fall? 0  Follow up Falls evaluation completed    Patient Care Team: Sherre Clapper, MD as PCP - General (Internal Medicine) Bernie Lamar PARAS, MD as PCP - Cardiology (Cardiology) Tat, Asberry RAMAN, DO as Consulting Physician (Neurology) Bernie Lamar PARAS, MD as Consulting Physician (Cardiology) Gust Royden ORN, MD (Orthopedic Surgery) Nyle Rankin POUR, [facility] (Inactive) (Pharmacist) Leslee Reusing, MD as Consulting Physician (Ophthalmology)   Review of Systems  Current Outpatient Medications on File Prior to Visit  Medication Sig Dispense Refill   albuterol  (VENTOLIN  HFA) 108 (90 Base) MCG/ACT inhaler USE 2 INHALATIONS BY MOUTH EVERY 6 HOURS AS NEEDED FOR WHEEZING  OR SHORTNESS OF BREATH 34 g 2   amantadine  (SYMMETREL ) 100 MG capsule TAKE 1 CAPSULE  BY MOUTH AT  1 PM (Patient taking differently: Take 100 mg by mouth daily. TAKE 1 CAPSULE BY MOUTH AT  1 PM) 90 capsule 0   aspirin  EC 81 MG tablet Take 81 mg by mouth daily. Swallow whole.     atorvastatin  (LIPITOR) 20 MG tablet TAKE 1 TABLET BY MOUTH ONCE  DAILY 100 tablet 2   buPROPion  (WELLBUTRIN  XL) 150 MG 24 hr tablet TAKE 1 TABLET BY MOUTH DAILY 90 tablet 3   carbidopa -levodopa  (SINEMET  CR) 50-200 MG tablet TAKE 1 TABLET BY MOUTH EVERY  NIGHT AT BEDTIME 90 tablet 3   carbidopa -levodopa  (SINEMET  IR) 25-100 MG tablet TAKE 2 TABLETS BY MOUTH 3 TIMES  DAILY 540 tablet 0   carvedilol  (COREG ) 3.125 MG tablet Take 1 tablet (3.125 mg total) by mouth 2 (two) times daily. 180 tablet 3   celecoxib  (CELEBREX ) 200 MG capsule TAKE 1 CAPSULE BY MOUTH DAILY 100 capsule 2   cholecalciferol (VITAMIN D3) 25 MCG (1000 UNIT) tablet Take 2,000 Units by mouth daily.     fluticasone  (FLONASE ) 50 MCG/ACT nasal spray Place 1 spray into both nostrils 3 times/day as needed-between  meals & bedtime. (Patient taking differently: Place 1 spray into both nostrils 3 times/day as needed-between meals & bedtime for allergies or rhinitis.) 48 g 3   furosemide  (LASIX ) 20 MG tablet Take 1 tablet (20 mg total) by mouth daily. 30 tablet 3   KRILL OIL PO Take 1 tablet by mouth daily.     montelukast  (SINGULAIR ) 10 MG tablet TAKE 1 TABLET BY MOUTH AT  BEDTIME 100 tablet 2   mupirocin  ointment (BACTROBAN ) 2 % Apply 1 Application topically 2 (two) times daily. 30 g 0   ondansetron  (ZOFRAN ) 4 MG tablet Take 1 tablet (4 mg total) by mouth every 8 (eight) hours as needed for nausea or vomiting. 60 tablet 1   polyethylene glycol powder (GLYCOLAX /MIRALAX ) 17 GM/SCOOP powder Take 0.5 Containers by mouth as needed for mild constipation or moderate constipation.     [START ON 08/11/2024] sertraline  (ZOLOFT ) 100 MG tablet Take 2 tablets (200 mg total) by mouth at bedtime. 180 tablet 0   tamsulosin  (FLOMAX ) 0.4 MG CAPS capsule TAKE 2 CAPSULES BY  MOUTH DAILY  AFTER SUPPER TAKE 2 CAPSULES BY  MOUTH DAILY AFTER SUPPER 200 capsule 2   triamcinolone  (NASACORT ) 55 MCG/ACT AERO nasal inhaler Place 2 sprays into the nose daily. 1 each 12   Zinc 50 MG TABS Take 50 mg by mouth at bedtime.     No current facility-administered medications on file prior to visit.   Past Medical History:  Diagnosis Date   Abnormal sputum color 07/22/2022   Acute non-recurrent maxillary sinusitis 06/29/2023   Allergic rhinitis due to allergen 07/22/2022   Anxiety    Aortic valve sclerosis    Arthritis    knees, neck, shoulder   Atherosclerosis of aorta 09/29/2021   B12 deficiency 09/29/2021   Benign hypertension 07/10/2016   Bipolar I disorder with depression (HCC) 01/27/2023   BPH (benign prostatic hyperplasia) 04/19/2018   Carotid artery stenosis 2022   Mild   Cerumen impaction 07/22/2022   CHF (congestive heart failure) (HCC)    Chronic diastolic CHF (congestive heart failure) (HCC) 04/19/2018   Closed nondisplaced fracture of distal phalanx of left thumb 09/24/2023   Contusion of face 09/24/2023   Cubital tunnel syndrome    DDD (degenerative disc disease), cervical    Depression    Elevated PSA    denies per patient   Elevated serum glucose 08/14/2023   Esophageal dysphagia 03/20/2023   Fall on same level from tripping 09/24/2023   Healthcare maintenance 07/18/2023   History of cardioembolic cerebrovascular accident (CVA)    HTN (hypertension)    Hyperlipidemia    Hyperplasia of prostate with lower urinary tract symptoms (LUTS)    Hypertensive heart disease with chronic diastolic congestive heart failure (HCC) 04/02/2021   Insomnia due to medical condition 09/24/2023   Mild dilation of ascending aorta    Mixed hyperlipidemia 09/23/2016   Overview:   2018: dx/rx MIS     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   Parkinson disease (HCC)    Parkinson's disease without  dyskinesia, with fluctuating manifestations (HCC) 03/20/2023   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   PFO (patent foramen ovale)    Prolapsed internal hemorrhoids, grade 3 04/19/2015   Rectal prolapse    S/P aortic valve replacement with bioprosthetic valve 05/01/2018   S/p left hip fracture 06/23/2021   Scoliosis of lumbosacral spine    Stroke (HCC)    x2   Subdural hematoma (HCC)  09/24/2023   Syncope 07/18/2023   Tobacco use 07/22/2022   Tremor    Vitamin D  deficiency    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   BENTALL PROCEDURE N/A 05/01/2018   Procedure: BIOLOGICAL BENTALL PROCEDURE AORTIC ROOT REPLACEMENT WITH VALSALVA GRAFT & INSPIRIS VALVE.;  Surgeon: Dusty Sudie DEL, MD;  Location: [facility] OR;  Service: Open Heart Surgery;  Laterality: N/A;   BUBBLE STUDY  04/13/2021   Procedure: BUBBLE STUDY;  Surgeon: Lonni Slain, MD;  Location: [facility] ENDOSCOPY;  Service: Cardiovascular;;   CATARACT EXTRACTION Bilateral 04/2018   COLONOSCOPY  2007   NASAL SINUS SURGERY  2006   RIGHT/LEFT HEART CATH AND CORONARY ANGIOGRAPHY N/A 04/27/2018   Procedure: RIGHT/LEFT HEART CATH AND CORONARY ANGIOGRAPHY;  Surgeon: Cherrie Toribio SAUNDERS, MD;  Location: [facility] INVASIVE CV LAB;  Service: Cardiovascular;  Laterality: N/A;   TEE WITHOUT CARDIOVERSION N/A 04/27/2018   Procedure: TRANSESOPHAGEAL ECHOCARDIOGRAM (TEE);  Surgeon: Cherrie Toribio SAUNDERS, MD;  Location: [facility] ENDOSCOPY;  Service: Cardiovascular;  Laterality: N/A;   TEE WITHOUT CARDIOVERSION N/A 05/01/2018   Procedure: TRANSESOPHAGEAL ECHOCARDIOGRAM (TEE);  Surgeon: Dusty Sudie DEL, MD;  Location[facility] OR;  Service: Open Heart Surgery;  Laterality: N/A;   TEE WITHOUT CARDIOVERSION N/A 04/13/2021   Procedure: TRANSESOPHAGEAL ECHOCARDIOGRAM (TEE);  Surgeon: Lonni Slain, MD;  Location: [facility] ENDOSCOPY;  Service: Cardiovascular;  Laterality: N/A;   TRANSANAL HEMORRHOIDAL DEARTERIALIZATION  N/A 11/15/2015   Procedure: TRANSANAL HEMORRHOIDAL DEARTERIALIZATION;  Surgeon: Bernarda Ned, MD;  Location: [facility];  Service: General;  Laterality: N/A;    Family History  Problem Relation Age of Onset   COPD Mother    Heart attack Mother    Colon polyps Father    Prostate cancer Father    Parkinson's disease Father    Hyperlipidemia Father    Depression Sister    Alcohol abuse Sister    Hypertension Sister    Breast cancer Sister    Hypertension Brother    Prostate cancer Paternal Uncle    Social History   Socioeconomic History   Marital status: Single    Spouse name: Not on file   Number of children: 2   Years of education: Not on file   Highest education level: 12th grade  Occupational History   Occupation: retired    Comment: security guard  Tobacco Use   Smoking status: Every Day    Current packs/day: 0.15    Average packs/day: 0.2 packs/day for 50.0 years (7.5 ttl pk-yrs)    Types: Cigarettes   Smokeless tobacco: Never   Tobacco comments:    Patient has cut down to three cigarettes a day  Vaping Use   Vaping status: Former  Substance and Sexual Activity   Alcohol use: Not Currently    Comment: Occasional   Drug use: No   Sexual activity: Not Currently  Other Topics Concern   Not on file  Social History Narrative   1 son, 1 daughter   Agricultural Consultant work   3 caffeine/day   Right handed   Social Drivers of Health   Financial Resource Strain: Low Risk  (04/28/2024)   Overall Financial Resource Strain (CARDIA)    Difficulty of Paying Living Expenses: Not hard at all  Food Insecurity: Unknown (04/28/2024)   Hunger Vital Sign    Worried About Running Out of Food in the Last Year: Patient declined    Ran Out of Food in the Last Year: Never true  Transportation Needs: No Transportation Needs (04/28/2024)   PRAPARE - Administrator, Civil Service (Medical): No    Lack of Transportation (Non-Medical): No  Physical Activity:  Insufficiently Active (04/28/2024)   Exercise Vital Sign    Days of Exercise per Week: 2 days    Minutes of Exercise per Session: 20 min  Stress: Stress Concern Present (04/28/2024)   [employer] of Occupational Health - Occupational Stress Questionnaire    Feeling of Stress: Rather much  Social Connections: Moderately Isolated (04/28/2024)   Social Connection and Isolation Panel    Frequency of Communication with Friends and Family: Twice a week    Frequency of Social Gatherings with Friends and Family: Once a week    Attends Religious Services: Patient declined    Database Administrator or Organizations: No    Attends Engineer, Structural: Not on file    Marital Status: Living with partner    Objective:  There were no vitals taken for this visit.     04/28/2024    1:57 PM 03/09/2024    3:41 PM 01/22/2024    3:15 PM  BP/Weight  Systolic BP 130 130 132  Diastolic BP 80 88 90  Wt. (Lbs) 170 166 165.2  BMI 22.43 kg/m2 22.51 kg/m2 22.41 kg/m2    Physical Exam  {Perform Simple Foot Exam  Perform Detailed exam:1} {Insert foot Exam (Optional):30965}   Lab Results  Component Value Date   WBC 8.4 04/28/2024   HGB 13.8 04/28/2024   HCT 41.0 04/28/2024   PLT 211 04/28/2024   GLUCOSE 92 04/28/2024   CHOL 106 07/18/2023   TRIG 76 07/18/2023   HDL 54 07/18/2023   LDLCALC 36 07/18/2023   ALT 11 04/28/2024   AST 20 04/28/2024   NA 142 04/28/2024   K 4.4 04/28/2024   CL 104 04/28/2024   CREATININE 0.83 04/28/2024   BUN 17 04/28/2024   CO2 20 04/28/2024   TSH 1.680 04/28/2024   INR 1.58 05/01/2018   HGBA1C 5.6 08/13/2023    Results for orders placed or performed in visit on 04/28/24  Comprehensive metabolic panel with GFR   Collection Time: 04/28/24  3:17 PM  Result Value Ref Range   Glucose 92 70 - 99 mg/dL   BUN 17 8 - 27 mg/dL   Creatinine, Ser 9.16 0.76 - 1.27 mg/dL   eGFR 91 >40 fO/fpw/8.26   BUN/Creatinine Ratio 20 10 - 24   Sodium 142 134 - 144  mmol/L   Potassium 4.4 3.5 - 5.2 mmol/L   Chloride 104 96 - 106 mmol/L   CO2 20 20 - 29 mmol/L   Calcium  9.4 8.6 - 10.2 mg/dL   Total Protein 7.1 6.0 - 8.5 g/dL   Albumin  4.3 3.8 - 4.8 g/dL   Globulin, Total 2.8 1.5 - 4.5 g/dL   Bilirubin Total 0.6 0.0 - 1.2 mg/dL   Alkaline Phosphatase 98 44 - 121 IU/L   AST 20 0 - 40 IU/L   ALT 11 0 - 44 IU/L  CBC with Differential/Platelet   Collection Time: 04/28/24  3:17 PM  Result Value Ref Range   WBC 8.4 3.4 - 10.8 x10E3/uL   RBC 3.93 (L) 4.14 - 5.80 x10E6/uL   Hemoglobin 13.8 13.0 - 17.7 g/dL   Hematocrit 58.9 62.4 - 51.0 %   MCV 104 (H) 79 - 97 fL   MCH 35.1 (H) 26.6 - 33.0 pg   MCHC 33.7 31.5 - 35.7 g/dL  RDW 12.2 11.6 - 15.4 %   Platelets 211 150 - 450 x10E3/uL   Neutrophils 67 Not Estab. %   Lymphs 22 Not Estab. %   Monocytes 10 Not Estab. %   Eos 1 Not Estab. %   Basos 0 Not Estab. %   Neutrophils Absolute 5.6 1.4 - 7.0 x10E3/uL   Lymphocytes Absolute 1.8 0.7 - 3.1 x10E3/uL   Monocytes Absolute 0.8 0.1 - 0.9 x10E3/uL   EOS (ABSOLUTE) 0.1 0.0 - 0.4 x10E3/uL   Basophils Absolute 0.0 0.0 - 0.2 x10E3/uL   Immature Granulocytes 0 Not Estab. %   Immature Grans (Abs) 0.0 0.0 - 0.1 x10E3/uL  TSH   Collection Time: 04/28/24  3:17 PM  Result Value Ref Range   TSH 1.680 0.450 - 4.500 uIU/mL  .  Assessment & Plan:   Assessment & Plan    There is no height or weight on file to calculate BMI.  Assessment and Plan Assessment & Plan      No orders of the defined types were placed in this encounter.   No orders of the defined types were placed in this encounter.      Follow-up: No follow-ups on file.  An After Visit Summary was printed and given to the patient.  Abigail Free, MD [facility] [phone]

## 2024-08-02 ENCOUNTER — Ambulatory Visit: Admitting: Family Medicine

## 2024-08-05 ENCOUNTER — Ambulatory Visit: Payer: Medicare Other

## 2024-08-16 ENCOUNTER — Encounter: Payer: Self-pay | Admitting: Family Medicine

## 2024-08-16 ENCOUNTER — Ambulatory Visit: Admitting: Family Medicine

## 2024-08-16 VITALS — BP 130/80 | HR 78 | Temp 98.1°F | Ht 73.0 in | Wt 179.0 lb

## 2024-08-16 DIAGNOSIS — E782 Mixed hyperlipidemia: Secondary | ICD-10-CM | POA: Diagnosis not present

## 2024-08-16 DIAGNOSIS — G20A2 Parkinson's disease without dyskinesia, with fluctuations: Secondary | ICD-10-CM

## 2024-08-16 DIAGNOSIS — Z8781 Personal history of (healed) traumatic fracture: Secondary | ICD-10-CM | POA: Insufficient documentation

## 2024-08-16 DIAGNOSIS — F330 Major depressive disorder, recurrent, mild: Secondary | ICD-10-CM | POA: Insufficient documentation

## 2024-08-16 DIAGNOSIS — Z9889 Other specified postprocedural states: Secondary | ICD-10-CM | POA: Diagnosis not present

## 2024-08-16 DIAGNOSIS — N3000 Acute cystitis without hematuria: Secondary | ICD-10-CM

## 2024-08-16 DIAGNOSIS — J00 Acute nasopharyngitis [common cold]: Secondary | ICD-10-CM | POA: Insufficient documentation

## 2024-08-16 LAB — CBC WITH DIFFERENTIAL/PLATELET
Basophils Absolute: 0 x10E3/uL (ref 0.0–0.2)
Basos: 0 %
EOS (ABSOLUTE): 0 x10E3/uL (ref 0.0–0.4)
Eos: 0 %
Hematocrit: 39 % (ref 37.5–51.0)
Hemoglobin: 13.6 g/dL (ref 13.0–17.7)
Immature Grans (Abs): 0.1 x10E3/uL (ref 0.0–0.1)
Immature Granulocytes: 1 %
Lymphocytes Absolute: 1.5 x10E3/uL (ref 0.7–3.1)
Lymphs: 9 %
MCH: 34.6 pg — ABNORMAL HIGH (ref 26.6–33.0)
MCHC: 34.9 g/dL (ref 31.5–35.7)
MCV: 99 fL — ABNORMAL HIGH (ref 79–97)
Monocytes Absolute: 1.3 x10E3/uL — ABNORMAL HIGH (ref 0.1–0.9)
Monocytes: 8 %
Neutrophils Absolute: 14.3 x10E3/uL — ABNORMAL HIGH (ref 1.4–7.0)
Neutrophils: 82 %
Platelets: 162 x10E3/uL (ref 150–450)
RBC: 3.93 x10E6/uL — ABNORMAL LOW (ref 4.14–5.80)
RDW: 11.4 % — ABNORMAL LOW (ref 11.6–15.4)
WBC: 17.2 x10E3/uL — ABNORMAL HIGH (ref 3.4–10.8)

## 2024-08-16 LAB — COMPREHENSIVE METABOLIC PANEL WITH GFR
ALT: 7 IU/L (ref 0–44)
AST: 19 IU/L (ref 0–40)
Albumin: 4 g/dL (ref 3.8–4.8)
Alkaline Phosphatase: 93 IU/L (ref 47–123)
BUN/Creatinine Ratio: 18 (ref 10–24)
BUN: 17 mg/dL (ref 8–27)
Bilirubin Total: 1 mg/dL (ref 0.0–1.2)
CO2: 22 mmol/L (ref 20–29)
Calcium: 9.7 mg/dL (ref 8.6–10.2)
Chloride: 100 mmol/L (ref 96–106)
Creatinine, Ser: 0.96 mg/dL (ref 0.76–1.27)
Globulin, Total: 2.8 g/dL (ref 1.5–4.5)
Glucose: 97 mg/dL (ref 70–99)
Potassium: 4.4 mmol/L (ref 3.5–5.2)
Sodium: 138 mmol/L (ref 134–144)
Total Protein: 6.8 g/dL (ref 6.0–8.5)
eGFR: 82 mL/min/1.73 (ref >59)

## 2024-08-16 LAB — LIPID PANEL
Chol/HDL Ratio: 1.9 ratio (ref 0.0–5.0)
Cholesterol, Total: 110 mg/dL (ref 100–199)
HDL: 58 mg/dL (ref >39)
LDL Chol Calc (NIH): 39 mg/dL (ref 0–99)
Triglycerides: 57 mg/dL (ref 0–149)
VLDL Cholesterol Cal: 13 mg/dL (ref 5–40)

## 2024-08-16 LAB — POCT URINALYSIS DIP (CLINITEK)
Bilirubin, UA: NEGATIVE
Blood, UA: NEGATIVE
Glucose, UA: NEGATIVE mg/dL
Ketones, POC UA: NEGATIVE mg/dL
Nitrite, UA: NEGATIVE
POC PROTEIN,UA: 30 — AB
Spec Grav, UA: >=1.03 — AB (ref 1.010–1.025)
Urobilinogen, UA: 0.2 U/dL
pH, UA: 6 (ref 5.0–8.0)

## 2024-08-16 LAB — POCT INFLUENZA A/B
Influenza A, POC: NEGATIVE
Influenza B, POC: NEGATIVE

## 2024-08-16 LAB — POC COVID19 BINAXNOW: SARS Coronavirus 2 Ag: NEGATIVE

## 2024-08-16 MED ORDER — CIPROFLOXACIN HCL 250 MG PO TABS: 250.0000 mg | ORAL_TABLET | Freq: Two times a day (BID) | ORAL | 0 refills | Status: AC

## 2024-08-16 MED ORDER — ONDANSETRON HCL 4 MG PO TABS: 4.0000 mg | ORAL_TABLET | Freq: Three times a day (TID) | ORAL | 0 refills | Status: AC | PRN

## 2024-08-16 NOTE — Assessment & Plan Note (Signed)
 Negative for covid and flu.  Hydrate.  Otc cold medicines.   Orders:   POC COVID-19 BinaxNow   POCT Influenza A/B

## 2024-08-16 NOTE — Assessment & Plan Note (Signed)
 Still with pain - refer back to ent.  Orders:   Ambulatory referral to ENT

## 2024-08-16 NOTE — Assessment & Plan Note (Signed)
 The current medical regimen is effective;  continue present plan and medications.

## 2024-08-16 NOTE — Assessment & Plan Note (Signed)
 Patient feels it is worsening. Has appointment tomorrow with Dr. Evonnie, neurology. Encouraged him to speak with her.  -Continue current medications including Sinemet  25/100mg  two tablets three times a day, Sinemet  CR 50/200mg  once at bedtime, and Amantadine .

## 2024-08-16 NOTE — Progress Notes (Signed)
 Subjective:  Patient ID: Travis Reese, male    DOB: [birth year]  Age: 78 y.o. MRN: [id number]  Chief Complaint  Patient presents with   Medical Management of Chronic Issues    HPI: Discussed the use of AI scribe software for clinical note transcription with the patient, who gave verbal consent to proceed.  History of Present Illness Travis Reese is a 78 year old male with Parkinson's disease who presents with symptoms of a possible urinary tract infection and respiratory illness.  Urinary symptoms - Dysuria for four to five days - Increased urinary frequency - Sensation of incomplete bladder emptying - Nausea without vomiting  Respiratory symptoms - Sore throat for four to five days - Myalgias - Fever - Chills - Sweats - Nasal congestion - Headaches - Describes head as feeling 'stopped up' - No significant coughing - New onset dyspnea  Parkinsonism and neurological symptoms - Parkinson's disease with worsening symptoms over the past month - Increased tremors - Lower back and leg pain - Bilateral leg weakness - Significant weakness over the last three days, making it difficult to rise from a seated position - Worsening memory issues - Scheduled to see neurologist tomorrow  Recent trauma - Fall in early 2024 resulting in periorbital fractures - Persistent tenderness of periorbital area - Consulted ENT specialist for evaluation  Constitutional symptoms - Decreased appetite - Inadequate fluid intake - Last substantial meal was the previous night       08/16/2024   10:48 AM 04/28/2024    1:58 PM 12/30/2023    1:35 PM 08/05/2023   11:41 AM 07/18/2023    9:24 AM  Depression screen PHQ 2/9  Decreased Interest 0 0 2 0 0  Down, Depressed, Hopeless 0 0 2 0 0  PHQ - 2 Score 0 0 4 0 0  Altered sleeping 0 0 0 0 0  Tired, decreased energy 0 0 0 0 0  Change in appetite 0 0 0 0 2  Feeling bad or failure about yourself  0 0 1 0 0  Trouble concentrating 0 0 1 0 0   Moving slowly or fidgety/restless 0 0 0 0 0  Suicidal thoughts 0 0 0 0 0  PHQ-9 Score 0 0  6  0  2   Difficult doing work/chores Not difficult at all Not difficult at all  Not difficult at all Not difficult at all     Data saved with a previous flowsheet row definition        01/22/2024    3:16 PM  Fall Risk   Falls in the past year? 0  Number falls in past yr: 0  Injury with Fall? 0   Follow up Falls evaluation completed     Data saved with a previous flowsheet row definition    Patient Care Team: Sherre Clapper, MD as PCP - General (Internal Medicine) Bernie Lamar PARAS, MD as PCP - Cardiology (Cardiology) Tat, Asberry RAMAN, DO as Consulting Physician (Neurology) Bernie Lamar PARAS, MD as Consulting Physician (Cardiology) Gust Royden ORN, MD (Orthopedic Surgery) Nyle Rankin POUR, [facility] (Inactive) (Pharmacist) Leslee Reusing, MD as Consulting Physician (Ophthalmology)   Review of Systems  Constitutional:  Positive for chills, diaphoresis, fatigue and fever.  HENT:  Positive for congestion and sore throat. Negative for ear pain.   Respiratory:  Positive for shortness of breath. Negative for cough.   Cardiovascular:  Negative for chest pain.  Gastrointestinal:  Positive for abdominal pain and nausea. Negative for constipation, diarrhea  and vomiting.  Endocrine: Positive for polyuria.  Genitourinary:  Positive for dysuria, frequency and urgency.  Musculoskeletal:  Negative for arthralgias and myalgias.  Neurological:  Positive for headaches. Negative for dizziness.  Psychiatric/Behavioral:  Negative for dysphoric mood.        No dysphoria    Medications Ordered Prior to Encounter[1] Past Medical History:  Diagnosis Date   Abnormal sputum color 07/22/2022   Acute non-recurrent maxillary sinusitis 06/29/2023   Allergic rhinitis due to allergen 07/22/2022   Anxiety    Aortic valve sclerosis    Arthritis    knees, neck, shoulder   Atherosclerosis of aorta 09/29/2021   B12  deficiency 09/29/2021   Benign hypertension 07/10/2016   Bipolar I disorder with depression (HCC) 01/27/2023   BPH (benign prostatic hyperplasia) 04/19/2018   Carotid artery stenosis 2022   Mild   Cerumen impaction 07/22/2022   CHF (congestive heart failure) (HCC)    Chronic diastolic CHF (congestive heart failure) (HCC) 04/19/2018   Closed nondisplaced fracture of distal phalanx of left thumb 09/24/2023   Contusion of face 09/24/2023   Cubital tunnel syndrome    DDD (degenerative disc disease), cervical    Depression    Elevated PSA    denies per patient   Elevated serum glucose 08/14/2023   Esophageal dysphagia 03/20/2023   Fall on same level from tripping 09/24/2023   Healthcare maintenance 07/18/2023   History of cardioembolic cerebrovascular accident (CVA)    HTN (hypertension)    Hyperlipidemia    Hyperplasia of prostate with lower urinary tract symptoms (LUTS)    Hypertensive heart disease with chronic diastolic congestive heart failure (HCC) 04/02/2021   Insomnia due to medical condition 09/24/2023   Mild dilation of ascending aorta    Mixed hyperlipidemia 09/23/2016   Overview:   2018: dx/rx MIS     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   Parkinson disease (HCC)    Parkinson's disease without dyskinesia, with fluctuating manifestations (HCC) 03/20/2023   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   PFO (patent foramen ovale)    Prolapsed internal hemorrhoids, grade 3 04/19/2015   Rectal prolapse    S/P aortic valve replacement with bioprosthetic valve 05/01/2018   S/p left hip fracture 06/23/2021   Scoliosis of lumbosacral spine    Stroke [facility])    x2   Subdural hematoma (HCC) 09/24/2023   Syncope 07/18/2023   Tobacco use 07/22/2022   Tremor    Vitamin D  deficiency    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   BENTALL PROCEDURE N/A 05/01/2018    Procedure: BIOLOGICAL BENTALL PROCEDURE AORTIC ROOT REPLACEMENT WITH VALSALVA GRAFT & INSPIRIS VALVE.;  Surgeon: Dusty Sudie DEL, MD;  Location: [facility] OR;  Service: Open Heart Surgery;  Laterality: N/A;   BUBBLE STUDY  04/13/2021   Procedure: BUBBLE STUDY;  Surgeon: Lonni Slain, MD;  Location: [facility] ENDOSCOPY;  Service: Cardiovascular;;   CATARACT EXTRACTION Bilateral 04/2018   COLONOSCOPY  2007   NASAL SINUS SURGERY  2006   RIGHT/LEFT HEART CATH AND CORONARY ANGIOGRAPHY N/A 04/27/2018   Procedure: RIGHT/LEFT HEART CATH AND CORONARY ANGIOGRAPHY;  Surgeon: Cherrie Toribio SAUNDERS, MD;  Location: [facility] INVASIVE CV LAB;  Service: Cardiovascular;  Laterality: N/A;   TEE WITHOUT CARDIOVERSION N/A 04/27/2018   Procedure: TRANSESOPHAGEAL ECHOCARDIOGRAM (TEE);  Surgeon: Cherrie Toribio SAUNDERS, MD;  Location: [facility] ENDOSCOPY;  Service: Cardiovascular;  Laterality: N/A;   TEE WITHOUT  CARDIOVERSION N/A 05/01/2018   Procedure: TRANSESOPHAGEAL ECHOCARDIOGRAM (TEE);  Surgeon: Dusty Sudie DEL, MD;  Location: [facility] OR;  Service: Open Heart Surgery;  Laterality: N/A;   TEE WITHOUT CARDIOVERSION N/A 04/13/2021   Procedure: TRANSESOPHAGEAL ECHOCARDIOGRAM (TEE);  Surgeon: Lonni Slain, MD;  Location: [facility] ENDOSCOPY;  Service: Cardiovascular;  Laterality: N/A;   TRANSANAL HEMORRHOIDAL DEARTERIALIZATION N/A 11/15/2015   Procedure: TRANSANAL HEMORRHOIDAL DEARTERIALIZATION;  Surgeon: Bernarda Ned, MD;  Location: [facility];  Service: General;  Laterality: N/A;    Family History  Problem Relation Age of Onset   COPD Mother    Heart attack Mother    Colon polyps Father    Prostate cancer Father    Parkinson's disease Father    Hyperlipidemia Father    Depression Sister    Alcohol abuse Sister    Hypertension Sister    Breast cancer Sister    Hypertension Brother    Prostate cancer Paternal Uncle    Social History   Socioeconomic History   Marital status: Single    Spouse name: Not  on file   Number of children: 2   Years of education: Not on file   Highest education level: 12th grade  Occupational History   Occupation: retired    Comment: security guard  Tobacco Use   Smoking status: Every Day    Current packs/day: 0.15    Average packs/day: 0.2 packs/day for 50.0 years (7.5 ttl pk-yrs)    Types: Cigarettes   Smokeless tobacco: Never   Tobacco comments:    Patient has cut down to three cigarettes a day  Vaping Use   Vaping status: Former  Substance and Sexual Activity   Alcohol use: Not Currently    Comment: Occasional   Drug use: No   Sexual activity: Not Currently  Other Topics Concern   Not on file  Social History Narrative   1 son, 1 daughter   Agricultural Consultant work   3 caffeine/day   Right handed   Social Drivers of Health   Tobacco Use: High Risk (08/16/2024)   Patient History    Smoking Tobacco Use: Every Day    Smokeless Tobacco Use: Never    Passive Exposure: Not on file  Financial Resource Strain: Low Risk (08/13/2024)   Overall Financial Resource Strain (CARDIA)    Difficulty of Paying Living Expenses: Not hard at all  Food Insecurity: No Food Insecurity (08/13/2024)   Epic    Worried About Programme Researcher, Broadcasting/film/video in the Last Year: Never true    Ran Out of Food in the Last Year: Never true  Transportation Needs: Patient Declined (08/13/2024)   Epic    Lack of Transportation (Medical): Patient declined    Lack of Transportation (Non-Medical): Patient declined  Physical Activity: Inactive (08/13/2024)   Exercise Vital Sign    Days of Exercise per Week: 0 days    Minutes of Exercise per Session: Not on file  Stress: Stress Concern Present (08/13/2024)   [employer] of Occupational Health - Occupational Stress Questionnaire    Feeling of Stress: Very much  Social Connections: Unknown (08/13/2024)   Social Connection and Isolation Panel    Frequency of Communication with Friends and Family: Twice a week    Frequency of Social  Gatherings with Friends and Family: Not on file    Attends Religious Services: Patient declined    Active Member of Clubs or Organizations: No    Attends Banker Meetings: Not on file    Marital  Status: Living with partner  Depression (PHQ2-9): Low Risk (08/16/2024)   Depression (PHQ2-9)    PHQ-2 Score: 0  Alcohol Screen: Low Risk (08/16/2022)   Alcohol Screen    Last Alcohol Screening Score (AUDIT): 1  Housing: Unknown (08/13/2024)   Epic    Unable to Pay for Housing in the Last Year: Not on file    Number of Times Moved in the Last Year: 0    Homeless in the Last Year: No  Utilities: Not At Risk (01/27/2024)   Received from [facility] Utilities    Threatened with loss of utilities: No  Health Literacy: Adequate Health Literacy (08/05/2023)   B1300 Health Literacy    Frequency of need for help with medical instructions: Never    Objective:  BP 130/80   Pulse 78   Temp 98.1 F (36.7 C)   Ht 6' 1 (1.854 m)   Wt 179 lb (81.2 kg)   SpO2 98%   BMI 23.62 kg/m      08/16/2024   10:47 AM 04/28/2024    1:57 PM 03/09/2024    3:41 PM  BP/Weight  Systolic BP 130 130 130  Diastolic BP 80 80 88  Wt. (Lbs) 179 170 166  BMI 23.62 kg/m2 22.43 kg/m2 22.51 kg/m2    Physical Exam Vitals reviewed.  Constitutional:      Appearance: Normal appearance.  HENT:     Right Ear: Tympanic membrane normal.     Left Ear: Tympanic membrane normal.     Nose: Congestion present.     Comments: Left maxillary region tender.     Mouth/Throat:     Pharynx: No oropharyngeal exudate or posterior oropharyngeal erythema.  Neck:     Vascular: No carotid bruit.  Cardiovascular:     Rate and Rhythm: Normal rate and regular rhythm.     Heart sounds: Normal heart sounds.  Pulmonary:     Effort: Pulmonary effort is normal.     Breath sounds: Normal breath sounds. No wheezing, rhonchi or rales.  Abdominal:     General: Bowel sounds are normal.     Palpations: Abdomen is soft.      Tenderness: There is abdominal tenderness (suprapubic).  Lymphadenopathy:     Cervical: No cervical adenopathy.  Neurological:     Mental Status: He is alert.     Gait: Gait abnormal.  Psychiatric:        Mood and Affect: Mood normal.        Behavior: Behavior normal.         Lab Results  Component Value Date   WBC 8.4 04/28/2024   HGB 13.8 04/28/2024   HCT 41.0 04/28/2024   PLT 211 04/28/2024   GLUCOSE 92 04/28/2024   CHOL 106 07/18/2023   TRIG 76 07/18/2023   HDL 54 07/18/2023   LDLCALC 36 07/18/2023   ALT 11 04/28/2024   AST 20 04/28/2024   NA 142 04/28/2024   K 4.4 04/28/2024   CL 104 04/28/2024   CREATININE 0.83 04/28/2024   BUN 17 04/28/2024   CO2 20 04/28/2024   TSH 1.680 04/28/2024   INR 1.58 05/01/2018   HGBA1C 5.6 08/13/2023    Results for orders placed or performed in visit on 08/16/24  POC COVID-19 BinaxNow   Collection Time: 08/16/24 11:35 AM  Result Value Ref Range   SARS Coronavirus 2 Ag Negative Negative  POCT Influenza A/B   Collection Time: 08/16/24 11:35 AM  Result Value Ref Range  Influenza A, POC Negative Negative   Influenza B, POC Negative Negative  POCT URINALYSIS DIP (CLINITEK)   Collection Time: 08/16/24 11:49 AM  Result Value Ref Range   Color, UA yellow yellow   Clarity, UA clear clear   Glucose, UA negative negative mg/dL   Bilirubin, UA negative negative   Ketones, POC UA negative negative mg/dL   Spec Grav, UA >=8.969 (A) 1.010 - 1.025   Blood, UA negative negative   pH, UA 6.0 5.0 - 8.0   POC PROTEIN,UA =30 (A) negative, trace   Urobilinogen, UA 0.2 0.2 or 1.0 E.U./dL   Nitrite, UA Negative Negative   Leukocytes, UA Large (3+) (A) Negative  .  Assessment & Plan:   Assessment & Plan Mixed hyperlipidemia Well controlled.  No changes to medicines. Continue atorvastatin  20 mg daily and krill oil. Continue to work on eating a healthy diet and exercise.  Labs drawn today.   Orders:   CBC with  Differential/Platelet   Comprehensive metabolic panel with GFR   Lipid panel   Parkinson's disease without dyskinesia, with fluctuating manifestations [facility]) Patient feels it is worsening. Has appointment tomorrow with Dr. Evonnie, neurology. Encouraged him to speak with her.  -Continue current medications including Sinemet  25/100mg  two tablets three times a day, Sinemet  CR 50/200mg  once at bedtime, and Amantadine .     Acute nasopharyngitis Negative for covid and flu.  Hydrate.  Otc cold medicines.   Orders:   POC COVID-19 BinaxNow   POCT Influenza A/B  Acute cystitis without hematuria Start on cipro .  Orders:   ondansetron  (ZOFRAN ) 4 MG tablet; Take 1 tablet (4 mg total) by mouth every 8 (eight) hours as needed for nausea or vomiting.   POCT URINALYSIS DIP (CLINITEK)   Urine Culture   ciprofloxacin  (CIPRO ) 250 MG tablet; Take 1 tablet (250 mg total) by mouth 2 (two) times daily for 7 days.  History of reduction of orbital fracture Still with pain - refer back to ent.  Orders:   Ambulatory referral to ENT  Depression, major, recurrent, mild The current medical regimen is effective;  continue present plan and medications.       Body mass index is 23.62 kg/m.    Meds ordered this encounter  Medications   ondansetron  (ZOFRAN ) 4 MG tablet    Sig: Take 1 tablet (4 mg total) by mouth every 8 (eight) hours as needed for nausea or vomiting.    Dispense:  180 tablet    Refill:  0   ciprofloxacin  (CIPRO ) 250 MG tablet    Sig: Take 1 tablet (250 mg total) by mouth 2 (two) times daily for 7 days.    Dispense:  14 tablet    Refill:  0    Orders Placed This Encounter  Procedures   Urine Culture   CBC with Differential/Platelet   Comprehensive metabolic panel with GFR   Lipid panel   Ambulatory referral to ENT   POC COVID-19 BinaxNow   POCT Influenza A/B   POCT URINALYSIS DIP (CLINITEK)       Follow-up: Return in about 3 months (around 11/14/2024) for chronic follow  up.  An After Visit Summary was printed and given to the patient.  Abigail Free, MD [facility] [phone]     [1]  Current Outpatient Medications on File Prior to Visit  Medication Sig Dispense Refill   albuterol  (VENTOLIN  HFA) 108 (90 Base) MCG/ACT inhaler USE 2 INHALATIONS BY MOUTH EVERY 6 HOURS AS NEEDED FOR WHEEZING  OR SHORTNESS OF BREATH 34 g 2   amantadine  (SYMMETREL ) 100 MG capsule TAKE 1 CAPSULE BY MOUTH AT  1 PM (Patient taking differently: Take 100 mg by mouth daily. TAKE 1 CAPSULE BY MOUTH AT  1 PM) 90 capsule 0   aspirin  EC 81 MG tablet Take 81 mg by mouth daily. Swallow whole.     atorvastatin  (LIPITOR) 20 MG tablet TAKE 1 TABLET BY MOUTH ONCE  DAILY 100 tablet 2   buPROPion  (WELLBUTRIN  XL) 150 MG 24 hr tablet TAKE 1 TABLET BY MOUTH DAILY 90 tablet 3   carbidopa -levodopa  (SINEMET  CR) 50-200 MG tablet TAKE 1 TABLET BY MOUTH EVERY  NIGHT AT BEDTIME 90 tablet 3   carbidopa -levodopa  (SINEMET  IR) 25-100 MG tablet TAKE 2 TABLETS BY MOUTH 3 TIMES  DAILY 540 tablet 0   carvedilol  (COREG ) 3.125 MG tablet Take 1 tablet (3.125 mg total) by mouth 2 (two) times daily. 180 tablet 3   celecoxib  (CELEBREX ) 200 MG capsule TAKE 1 CAPSULE BY MOUTH DAILY 100 capsule 2   cholecalciferol (VITAMIN D3) 25 MCG (1000 UNIT) tablet Take 2,000 Units by mouth daily.     fluticasone  (FLONASE ) 50 MCG/ACT nasal spray Place 1 spray into both nostrils 3 times/day as needed-between meals & bedtime. (Patient taking differently: Place 1 spray into both nostrils 3 times/day as needed-between meals & bedtime for allergies or rhinitis.) 48 g 3   furosemide  (LASIX ) 20 MG tablet Take 1 tablet (20 mg total) by mouth daily. 30 tablet 3   KRILL OIL PO Take 1 tablet by mouth daily.     montelukast  (SINGULAIR ) 10 MG tablet TAKE 1 TABLET BY MOUTH AT  BEDTIME 100 tablet 2   mupirocin  ointment (BACTROBAN ) 2 % Apply 1 Application topically 2 (two) times daily. 30 g 0   polyethylene glycol powder (GLYCOLAX /MIRALAX )  17 GM/SCOOP powder Take 0.5 Containers by mouth as needed for mild constipation or moderate constipation.     sertraline  (ZOLOFT ) 100 MG tablet Take 2 tablets (200 mg total) by mouth at bedtime. 180 tablet 0   tamsulosin  (FLOMAX ) 0.4 MG CAPS capsule TAKE 2 CAPSULES BY MOUTH DAILY  AFTER SUPPER TAKE 2 CAPSULES BY  MOUTH DAILY AFTER SUPPER 200 capsule 2   triamcinolone  (NASACORT ) 55 MCG/ACT AERO nasal inhaler Place 2 sprays into the nose daily. 1 each 12   Zinc 50 MG TABS Take 50 mg by mouth at bedtime.     No current facility-administered medications on file prior to visit.
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
## 2024-08-16 NOTE — Assessment & Plan Note (Signed)
 Well controlled.  No changes to medicines. Continue atorvastatin  20 mg daily and krill oil. Continue to work on eating a healthy diet and exercise.  Labs drawn today.   Orders:   CBC with Differential/Platelet   Comprehensive metabolic panel with GFR   Lipid panel

## 2024-08-16 NOTE — Assessment & Plan Note (Signed)
 Start on cipro .  Orders:   ondansetron  (ZOFRAN ) 4 MG tablet; Take 1 tablet (4 mg total) by mouth every 8 (eight) hours as needed for nausea or vomiting.   POCT URINALYSIS DIP (CLINITEK)   Urine Culture   ciprofloxacin  (CIPRO ) 250 MG tablet; Take 1 tablet (250 mg total) by mouth 2 (two) times daily for 7 days.

## 2024-08-16 NOTE — Assessment & Plan Note (Deleted)
°  Orders:   ondansetron  (ZOFRAN ) 4 MG tablet; Take 1 tablet (4 mg total) by mouth every 8 (eight) hours as needed for nausea or vomiting.

## 2024-08-16 NOTE — Patient Instructions (Signed)
°  VISIT SUMMARY: During your visit, we addressed your symptoms of a possible urinary tract infection and respiratory illness, as well as your Parkinson's disease and recent trauma. We conducted tests and provided recommendations for managing your symptoms.  YOUR PLAN: ACUTE UPPER RESPIRATORY INFECTION: You have symptoms of a respiratory infection, which could be influenza or COVID-19. -We have ordered tests for influenza and COVID-19. -Manage your fever with fever control medications. -Take cough medicine as needed. -Get plenty of rest and increase your fluid intake.  URINARY TRACT INFECTION: You have symptoms of a urinary tract infection. -We obtained a urine sample for analysis. -Cipro  prescription sent.   PARKINSON'S DISEASE WITH FLUCTUATIONS: Your Parkinson's disease symptoms have worsened over the past month. -Continue taking your current Parkinson's medications: amantadine  100 mg once daily, Sinemet  CR 50/200 mg at bedtime, and Sinemet  25/100 mg two tablets three times a day. -Follow up with your neurologist, Dr. Evonnie as scheduled.  MAJOR DEPRESSIVE DISORDER: Your mood appears well-managed. -Continue taking Zoloft  200 mg once daily.                      Contains text generated by [employer]bridge.                                 Contains text generated by [employer]bridge.

## 2024-08-17 ENCOUNTER — Ambulatory Visit: Payer: Self-pay | Admitting: Family Medicine

## 2024-08-17 ENCOUNTER — Ambulatory Visit: Admitting: Neurology

## 2024-08-19 LAB — URINE CULTURE

## 2024-09-08 ENCOUNTER — Other Ambulatory Visit: Payer: Self-pay | Admitting: Neurology

## 2024-09-08 DIAGNOSIS — G20B1 Parkinson's disease with dyskinesia, without mention of fluctuations: Secondary | ICD-10-CM

## 2024-09-08 DIAGNOSIS — G20A1 Parkinson's disease without dyskinesia, without mention of fluctuations: Secondary | ICD-10-CM

## 2024-09-08 NOTE — Telephone Encounter (Signed)
 Patient late for appointment on 12/16 and counted as a no show. He is currently scheduled for April. Approval to fill meds

## 2024-09-09 ENCOUNTER — Ambulatory Visit

## 2024-09-09 VITALS — BP 130/80 | Ht 73.0 in | Wt 172.0 lb

## 2024-09-09 DIAGNOSIS — Z0000 Encounter for general adult medical examination without abnormal findings: Secondary | ICD-10-CM | POA: Diagnosis not present

## 2024-09-09 NOTE — Patient Instructions (Signed)
 Mr. Ramcharan,  Thank you for taking the time for your Medicare Wellness Visit. I appreciate your continued commitment to your health goals. Please review the care plan we discussed, and feel free to reach out if I can assist you further.  Please note that Annual Wellness Visits do not include a physical exam. Some assessments may be limited, especially if the visit was conducted virtually. If needed, we may recommend an in-person follow-up with your provider.  Ongoing Care Seeing your primary care provider every 3 to 6 months helps us  monitor your health and provide consistent, personalized care.   Referrals If a referral was made during today's visit and you haven't received any updates within two weeks, please contact the referred provider directly to check on the status.  Recommended Screenings:  Health Maintenance  Topic Date Due   DTaP/Tdap/Td vaccine (1 - Tdap) Never done   Flu Shot  04/02/2024   COVID-19 Vaccine (7 - 2025-26 season) 05/03/2024   Medicare Annual Wellness Visit  08/04/2024   Pneumococcal Vaccine for age over 8  Completed   Zoster (Shingles) Vaccine  Completed   Meningitis B Vaccine  Aged Out   Hepatitis C Screening  Discontinued   Cologuard (Stool DNA test)  Discontinued       09/09/2024   10:36 AM  Advanced Directives  Does Patient Have a Medical Advance Directive? No  Would patient like information on creating a medical advance directive? No - Patient declined    Vision: Annual vision screenings are recommended for early detection of glaucoma, cataracts, and diabetic retinopathy. These exams can also reveal signs of chronic conditions such as diabetes and high blood pressure.  Dental: Annual dental screenings help detect early signs of oral cancer, gum disease, and other conditions linked to overall health, including heart disease and diabetes.  Please see the attached documents for additional preventive care recommendations.

## 2024-09-09 NOTE — Progress Notes (Signed)
 " I connected with  Dasie CROME Catanese on 09/09/2024 by a audio enabled telemedicine application and verified that I am speaking with the correct person using two identifiers.  Patient Location: Home  Provider Location: Office/Clinic  Persons Participating in Visit: Patient.  I discussed the limitations of evaluation and management by telemedicine. The patient expressed understanding and agreed to proceed.  Vital Signs: Because this visit was a virtual/telehealth visit, some criteria may be missing or patient reported. Any vitals not documented were not able to be obtained and vitals that have been documented are patient reported.  Chief Complaint  Patient presents with   Medicare Wellness     Subjective:   ESKER DEVER is a 79 y.o. male who presents for a Medicare Annual Wellness Visit.  Visit info / Clinical Intake: Medicare Wellness Visit Type:: Subsequent Annual Wellness Visit Persons participating in visit and providing information:: patient Medicare Wellness Visit Mode:: Telephone If telephone:: video declined Since this visit was completed virtually, some vitals may be partially provided or unavailable. Missing vitals are due to the limitations of the virtual format.: Documented vitals are patient reported If Telephone or Video please confirm:: I connected with patient using audio/video enable telemedicine. I verified patient identity with two identifiers, discussed telehealth limitations, and patient agreed to proceed. Patient Location:: home Provider Location:: office/clinic Interpreter Needed?: No Pre-visit prep was completed: yes AWV questionnaire completed by patient prior to visit?: no Living arrangements:: lives with spouse/significant other Patient's Overall Health Status Rating: very good Typical amount of pain: some Does pain affect daily life?: (!) yes Are you currently prescribed opioids?: no  Dietary Habits and Nutritional Risks How many meals a day?: 2 Eats  fruit and vegetables daily?: yes Most meals are obtained by: eating out; having others provide food In the last 2 weeks, have you had any of the following?: none Diabetic:: no  Functional Status Activities of Daily Living (to include ambulation/medication): Independent Ambulation: Independent Medication Administration: Independent Home Management (perform basic housework or laundry): Independent Manage your own finances?: yes Primary transportation is: family / friends Concerns about vision?: no *vision screening is required for WTM* Concerns about hearing?: no  Fall Screening Falls in the past year?: 0 Number of falls in past year: 0 Was there an injury with Fall?: 0 Fall Risk Category Calculator: 0 Patient Fall Risk Level: Low Fall Risk  Fall Risk Patient at Risk for Falls Due to: No Fall Risks Fall risk Follow up: Falls evaluation completed  Home and Transportation Safety: All rugs have non-skid backing?: yes All stairs or steps have railings?: yes Grab bars in the bathtub or shower?: yes Have non-skid surface in bathtub or shower?: yes Good home lighting?: yes Regular seat belt use?: yes Hospital stays in the last year:: no  Cognitive Assessment Difficulty concentrating, remembering, or making decisions? : yes Will 6CIT or Mini Cog be Completed: yes What year is it?: 0 points What month is it?: 0 points Give patient an address phrase to remember (5 components): remember words apple , table , penny About what time is it?: 0 points Count backwards from 20 to 1: 0 points Say the months of the year in reverse: 0 points Repeat the address phrase from earlier: 0 points 6 CIT Score: 0 points  Advance Directives (For Healthcare) Does Patient Have a Medical Advance Directive?: No Type of Advance Directive: Living will Would patient like information on creating a medical advance directive?: No - Patient declined  Reviewed/Updated  Reviewed/Updated: Reviewed All  (  Medical, Surgical, Family, Medications, Allergies, Care Teams, Patient Goals)    Allergies (verified) Patient has no known allergies.   Current Medications (verified) Outpatient Encounter Medications as of 09/09/2024  Medication Sig   albuterol  (VENTOLIN  HFA) 108 (90 Base) MCG/ACT inhaler USE 2 INHALATIONS BY MOUTH EVERY 6 HOURS AS NEEDED FOR WHEEZING  OR SHORTNESS OF BREATH   amantadine  (SYMMETREL ) 100 MG capsule TAKE 1 CAPSULE BY MOUTH AT  1 PM (Patient taking differently: Take 100 mg by mouth daily. TAKE 1 CAPSULE BY MOUTH AT  1 PM)   aspirin  EC 81 MG tablet Take 81 mg by mouth daily. Swallow whole.   atorvastatin  (LIPITOR) 20 MG tablet TAKE 1 TABLET BY MOUTH ONCE  DAILY   buPROPion  (WELLBUTRIN  XL) 150 MG 24 hr tablet TAKE 1 TABLET BY MOUTH DAILY   carbidopa -levodopa  (SINEMET  CR) 50-200 MG tablet TAKE 1 TABLET BY MOUTH EVERY  NIGHT AT BEDTIME   carbidopa -levodopa  (SINEMET  IR) 25-100 MG tablet Take 2 tablets by mouth 3 (three) times daily. 2 tablet 3 times daily at  9am/1pm/5pm   carvedilol  (COREG ) 3.125 MG tablet Take 1 tablet (3.125 mg total) by mouth 2 (two) times daily.   celecoxib  (CELEBREX ) 200 MG capsule TAKE 1 CAPSULE BY MOUTH DAILY   cholecalciferol (VITAMIN D3) 25 MCG (1000 UNIT) tablet Take 2,000 Units by mouth daily.   fluticasone  (FLONASE ) 50 MCG/ACT nasal spray Place 1 spray into both nostrils 3 times/day as needed-between meals & bedtime. (Patient taking differently: Place 1 spray into both nostrils 3 times/day as needed-between meals & bedtime for allergies or rhinitis.)   furosemide  (LASIX ) 20 MG tablet Take 1 tablet (20 mg total) by mouth daily.   KRILL OIL PO Take 1 tablet by mouth daily.   montelukast  (SINGULAIR ) 10 MG tablet TAKE 1 TABLET BY MOUTH AT  BEDTIME   mupirocin  ointment (BACTROBAN ) 2 % Apply 1 Application topically 2 (two) times daily.   ondansetron  (ZOFRAN ) 4 MG tablet Take 1 tablet (4 mg total) by mouth every 8 (eight) hours as needed for nausea or vomiting.    polyethylene glycol powder (GLYCOLAX /MIRALAX ) 17 GM/SCOOP powder Take 0.5 Containers by mouth as needed for mild constipation or moderate constipation.   sertraline  (ZOLOFT ) 100 MG tablet Take 2 tablets (200 mg total) by mouth at bedtime.   tamsulosin  (FLOMAX ) 0.4 MG CAPS capsule TAKE 2 CAPSULES BY MOUTH DAILY  AFTER SUPPER TAKE 2 CAPSULES BY  MOUTH DAILY AFTER SUPPER   triamcinolone  (NASACORT ) 55 MCG/ACT AERO nasal inhaler Place 2 sprays into the nose daily.   Zinc 50 MG TABS Take 50 mg by mouth at bedtime.   No facility-administered encounter medications on file as of 09/09/2024.    History: Past Medical History:  Diagnosis Date   Abnormal sputum color 07/22/2022   Acute non-recurrent maxillary sinusitis 06/29/2023   Allergic rhinitis due to allergen 07/22/2022   Allergy    Anxiety    Aortic valve sclerosis    Arthritis    knees, neck, shoulder   Atherosclerosis of aorta 09/29/2021   B12 deficiency 09/29/2021   Benign hypertension 07/10/2016   Bipolar I disorder with depression (HCC) 01/27/2023   BPH (benign prostatic hyperplasia) 04/19/2018   Carotid artery stenosis 2022   Mild   Cataract    Cerumen impaction 07/22/2022   CHF (congestive heart failure) (HCC)    Chronic diastolic CHF (congestive heart failure) (HCC) 04/19/2018   Closed nondisplaced fracture of distal phalanx of left thumb 09/24/2023   Contusion of face 09/24/2023  Cubital tunnel syndrome    DDD (degenerative disc disease), cervical    Depression    Elevated PSA    denies per patient   Elevated serum glucose 08/14/2023   Esophageal dysphagia 03/20/2023   Fall on same level from tripping 09/24/2023   GERD (gastroesophageal reflux disease)    Healthcare maintenance 07/18/2023   History of cardioembolic cerebrovascular accident (CVA)    HTN (hypertension)    Hyperlipidemia    Hyperplasia of prostate with lower urinary tract symptoms (LUTS)    Hypertensive heart disease with chronic diastolic congestive  heart failure (HCC) 04/02/2021   Insomnia due to medical condition 09/24/2023   Mild dilation of ascending aorta    Mixed hyperlipidemia 09/23/2016   Overview:   2018: dx/rx MIS     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   Myocardial infarction (HCC)    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   Parkinson disease (HCC)    Parkinson's disease without dyskinesia, with fluctuating manifestations (HCC) 03/20/2023   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   PFO (patent foramen ovale)    Prolapsed internal hemorrhoids, grade 3 04/19/2015   Rectal prolapse    S/P aortic valve replacement with bioprosthetic valve 05/01/2018   S/p left hip fracture 06/23/2021   Scoliosis of lumbosacral spine    Stroke [facility])    x2   Subdural hematoma (HCC) 09/24/2023   Syncope 07/18/2023   Tobacco use 07/22/2022   Tremor    Vitamin D  deficiency    Past Surgical History:  Procedure Laterality Date   ABDOMINAL HYSTERECTOMY     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   BENTALL PROCEDURE N/A 05/01/2018   Procedure: BIOLOGICAL BENTALL PROCEDURE AORTIC ROOT REPLACEMENT WITH VALSALVA GRAFT & INSPIRIS VALVE.;  Surgeon: Dusty Sudie DEL, MD;  Location: [facility] OR;  Service: Open Heart Surgery;  Laterality: N/A;   BUBBLE STUDY  04/13/2021   Procedure: BUBBLE STUDY;  Surgeon: Lonni Slain, MD;  Location: [facility] ENDOSCOPY;  Service: Cardiovascular;;   CARDIAC VALVE REPLACEMENT     CATARACT EXTRACTION Bilateral 04/2018   COLONOSCOPY  2007   EYE SURGERY     FRACTURE SURGERY     NASAL SINUS SURGERY  2006   RIGHT/LEFT HEART CATH AND CORONARY ANGIOGRAPHY N/A 04/27/2018   Procedure: RIGHT/LEFT HEART CATH AND CORONARY ANGIOGRAPHY;  Surgeon: Cherrie Toribio SAUNDERS, MD;  Location: [facility] INVASIVE CV LAB;  Service: Cardiovascular;  Laterality: N/A;   TEE WITHOUT CARDIOVERSION N/A 04/27/2018   Procedure: TRANSESOPHAGEAL ECHOCARDIOGRAM (TEE);  Surgeon: Cherrie Toribio SAUNDERS, MD;  Location: [facility]  ENDOSCOPY;  Service: Cardiovascular;  Laterality: N/A;   TEE WITHOUT CARDIOVERSION N/A 05/01/2018   Procedure: TRANSESOPHAGEAL ECHOCARDIOGRAM (TEE);  Surgeon: Dusty Sudie DEL, MD;  [facility] OR;  Service: Open Heart Surgery;  Laterality: N/A;   TEE WITHOUT CARDIOVERSION N/A 04/13/2021   Procedure: TRANSESOPHAGEAL ECHOCARDIOGRAM (TEE);  Surgeon: Lonni Slain, MD;  Location: [facility] ENDOSCOPY;  Service: Cardiovascular;  Laterality: N/A;   TRANSANAL HEMORRHOIDAL DEARTERIALIZATION N/A 11/15/2015   Procedure: TRANSANAL HEMORRHOIDAL DEARTERIALIZATION;  Surgeon: Bernarda Ned, MD;  Location: [facility];  Service: General;  Laterality: N/A;   Family History  Problem Relation Age of Onset   COPD Mother    Heart attack Mother    Colon polyps Father    Prostate cancer Father    Parkinson's disease Father    Hyperlipidemia Father    Arthritis Father    Hearing loss Father    Depression Sister    Alcohol abuse  Sister    Hypertension Sister    Anxiety disorder Sister    Breast cancer Sister    Cancer Sister    Hypertension Brother    Prostate cancer Paternal Uncle    Early death Son    Social History   Occupational History   Occupation: retired    Comment: security guard  Tobacco Use   Smoking status: Every Day    Current packs/day: 0.15    Average packs/day: 0.2 packs/day for 50.0 years (7.5 ttl pk-yrs)    Types: Cigarettes   Smokeless tobacco: Never   Tobacco comments:    Patient has cut down to three cigarettes a day  Vaping Use   Vaping status: Former  Substance and Sexual Activity   Alcohol use: Not Currently    Comment: Occasional   Drug use: Never   Sexual activity: Not Currently   Tobacco Counseling Ready to quit: No Counseling given: Not Answered Tobacco comments: Patient has cut down to three cigarettes a day  SDOH Screenings   Food Insecurity: No Food Insecurity (09/09/2024)  Housing: Low Risk (09/09/2024)  Transportation Needs: No  Transportation Needs (09/09/2024)  Utilities: Not At Risk (09/09/2024)  Alcohol Screen: Low Risk (08/16/2022)  Depression (PHQ2-9): Low Risk (09/09/2024)  Financial Resource Strain: Low Risk (08/13/2024)  Physical Activity: Sufficiently Active (09/09/2024)  Recent Concern: Physical Activity - Inactive (08/13/2024)  Social Connections: Moderately Isolated (09/09/2024)  Stress: No Stress Concern Present (09/09/2024)  Recent Concern: Stress - Stress Concern Present (08/13/2024)  Tobacco Use: High Risk (09/09/2024)  Health Literacy: Adequate Health Literacy (09/09/2024)   See flowsheets for full screening details  Depression Screen PHQ 2 & 9 Depression Scale- Over the past 2 weeks, how often have you been bothered by any of the following problems? Little interest or pleasure in doing things: 0 Feeling down, depressed, or hopeless (PHQ Adolescent also includes...irritable): 0 PHQ-2 Total Score: 0 Trouble falling or staying asleep, or sleeping too much: 0 Feeling tired or having little energy: 0 Poor appetite or overeating (PHQ Adolescent also includes...weight loss): 0 Feeling bad about yourself - or that you are a failure or have let yourself or your family down: 0 Trouble concentrating on things, such as reading the newspaper or watching television (PHQ Adolescent also includes...like school work): 0 Moving or speaking so slowly that other people could have noticed. Or the opposite - being so fidgety or restless that you have been moving around a lot more than usual: 0 Thoughts that you would be better off dead, or of hurting yourself in some way: 0 PHQ-9 Total Score: 0 If you checked off any problems, how difficult have these problems made it for you to do your work, take care of things at home, or get along with other people?: Not difficult at all  Depression Treatment Depression Interventions/Treatment : Medication; Currently on Treatment     Goals Addressed             This Visit's Progress     Patient Stated       Patient would like to get more exercise              Objective:    Today's Vitals   09/09/24 1036  BP: 130/80  Weight: 172 lb (78 kg)  Height: 6' 1 (1.854 m)   Body mass index is 22.69 kg/m.  Hearing/Vision screen Hearing Screening - Comments:: No difficulties  Vision Screening - Comments:: Patient wears readers and Dr Mccuen in [place] Immunizations and  Health Maintenance Health Maintenance  Topic Date Due   DTaP/Tdap/Td (1 - Tdap) Never done   Influenza Vaccine  04/02/2024   COVID-19 Vaccine (7 - 2025-26 season) 05/03/2024   Medicare Annual Wellness (AWV)  08/04/2024   Pneumococcal Vaccine: 50+ Years  Completed   Zoster Vaccines- Shingrix  Completed   Meningococcal B Vaccine  Aged Out   Hepatitis C Screening  Discontinued   Fecal DNA (Cologuard)  Discontinued        Assessment/Plan:  This is a routine wellness examination for [employer].  Patient Care Team: Sherre Clapper, MD as PCP - General (Internal Medicine) Bernie Lamar PARAS, MD as PCP - Cardiology (Cardiology) Tat, Asberry RAMAN, DO as Consulting Physician (Neurology) Bernie Lamar PARAS, MD as Consulting Physician (Cardiology) Gust Royden ORN, MD (Orthopedic Surgery) Nyle Rankin POUR, [facility] (Inactive) (Pharmacist) Leslee Reusing, MD as Consulting Physician (Ophthalmology) Bridgett Fonda LABOR, [place] (Physician Assistant) Vickey Mettle, MD as Consulting Physician (Psychiatry)  I have personally reviewed and noted the following in the patients chart:   Medical and social history Use of alcohol, tobacco or illicit drugs  Current medications and supplements including opioid prescriptions. Functional ability and status Nutritional status Physical activity Advanced directives List of other physicians Hospitalizations, surgeries, and ER visits in previous 12 months Vitals Screenings to include cognitive, depression, and falls Referrals and appointments  No orders of the defined  types were placed in this encounter.  In addition, I have reviewed and discussed with patient certain preventive protocols, quality metrics, and best practice recommendations. A written personalized care plan for preventive services as well as general preventive health recommendations were provided to patient.   Lyle POUR Right, [place]   09/09/2024   No follow-ups on file.  After Visit Summary: (MyChart) Due to this being a telephonic visit, the after visit summary with patients personalized plan was offered to patient via MyChart   No voiced or noted concerns at this time Vaccines not given: flu, covid, tdap declined today  Patient has an upcoming appointment scheduled for 11/18/2024 "
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
## 2024-09-17 ENCOUNTER — Telehealth: Admitting: Psychiatry

## 2024-09-17 ENCOUNTER — Encounter: Payer: Self-pay | Admitting: Psychiatry

## 2024-09-17 DIAGNOSIS — F419 Anxiety disorder, unspecified: Secondary | ICD-10-CM

## 2024-09-17 DIAGNOSIS — G4700 Insomnia, unspecified: Secondary | ICD-10-CM

## 2024-09-17 DIAGNOSIS — F3341 Major depressive disorder, recurrent, in partial remission: Secondary | ICD-10-CM

## 2024-09-17 MED ORDER — SERTRALINE HCL 100 MG PO TABS: 200.0000 mg | ORAL_TABLET | Freq: Every day | ORAL | 0 refills | Status: AC

## 2024-09-17 NOTE — Patient Instructions (Signed)
 Continue sertraline  200 mg at night Continue bupropion  150 mg daily  Next appointment: 3/11 at 9 30

## 2024-09-20 ENCOUNTER — Other Ambulatory Visit: Payer: Self-pay | Admitting: Family Medicine

## 2024-09-21 ENCOUNTER — Telehealth: Payer: Self-pay

## 2024-09-21 NOTE — Telephone Encounter (Signed)
 Pt LVM with no information asking for a call back.Called Pt back he did not answer LVM.

## 2024-09-30 ENCOUNTER — Other Ambulatory Visit: Payer: Self-pay

## 2024-09-30 MED ORDER — CARVEDILOL 3.125 MG PO TABS: 3.1250 mg | ORAL_TABLET | Freq: Two times a day (BID) | ORAL | 1 refills | Status: AC

## 2024-11-10 ENCOUNTER — Telehealth: Admitting: Psychiatry

## 2024-11-18 ENCOUNTER — Ambulatory Visit: Admitting: Family Medicine

## 2024-12-30 ENCOUNTER — Ambulatory Visit: Payer: Self-pay | Admitting: Neurology
